# Patient Record
Sex: Female | Born: 1935 | Race: White | Hispanic: No | Marital: Single | Smoking: Former smoker
Health system: Southern US, Community
[De-identification: ages and names within clinical notes are randomized; demographics above are authoritative.]

## PROBLEM LIST (undated history)

## (undated) DIAGNOSIS — I2510 Atherosclerotic heart disease of native coronary artery without angina pectoris: Secondary | ICD-10-CM

## (undated) DIAGNOSIS — B029 Zoster without complications: Secondary | ICD-10-CM

## (undated) DIAGNOSIS — Z902 Acquired absence of lung [part of]: Secondary | ICD-10-CM

## (undated) DIAGNOSIS — R51 Headache: Secondary | ICD-10-CM

## (undated) DIAGNOSIS — I714 Abdominal aortic aneurysm, without rupture, unspecified: Secondary | ICD-10-CM

## (undated) DIAGNOSIS — D649 Anemia, unspecified: Secondary | ICD-10-CM

## (undated) DIAGNOSIS — I77810 Thoracic aortic ectasia: Secondary | ICD-10-CM

## (undated) DIAGNOSIS — E7800 Pure hypercholesterolemia, unspecified: Secondary | ICD-10-CM

## (undated) DIAGNOSIS — Z952 Presence of prosthetic heart valve: Secondary | ICD-10-CM

## (undated) DIAGNOSIS — I679 Cerebrovascular disease, unspecified: Secondary | ICD-10-CM

## (undated) DIAGNOSIS — I7101 Dissection of thoracic aorta: Secondary | ICD-10-CM

## (undated) DIAGNOSIS — I10 Essential (primary) hypertension: Secondary | ICD-10-CM

## (undated) DIAGNOSIS — R911 Solitary pulmonary nodule: Secondary | ICD-10-CM

## (undated) DIAGNOSIS — J449 Chronic obstructive pulmonary disease, unspecified: Secondary | ICD-10-CM

## (undated) DIAGNOSIS — I4891 Unspecified atrial fibrillation: Secondary | ICD-10-CM

## (undated) DIAGNOSIS — C3490 Malignant neoplasm of unspecified part of unspecified bronchus or lung: Secondary | ICD-10-CM

## (undated) HISTORY — DX: Presence of prosthetic heart valve: Z95.2

## (undated) HISTORY — DX: Zoster without complications: B02.9

## (undated) HISTORY — DX: Acquired absence of lung (part of): Z90.2

## (undated) HISTORY — DX: Malignant neoplasm of unspecified part of unspecified bronchus or lung: C34.90

## (undated) HISTORY — DX: Unspecified atrial fibrillation: I48.91

## (undated) HISTORY — PX: CARDIAC CATHETERIZATION: SHX172

## (undated) HISTORY — DX: Abdominal aortic aneurysm, without rupture: I71.4

## (undated) HISTORY — DX: Solitary pulmonary nodule: R91.1

## (undated) HISTORY — PX: DILATION AND CURETTAGE OF UTERUS: SHX78

## (undated) HISTORY — DX: Thoracic aortic ectasia: I77.810

## (undated) HISTORY — DX: Chronic obstructive pulmonary disease, unspecified: J44.9

## (undated) HISTORY — PX: CATARACT EXTRACTION W/ INTRAOCULAR LENS  IMPLANT, BILATERAL: SHX1307

## (undated) HISTORY — DX: Atherosclerotic heart disease of native coronary artery without angina pectoris: I25.10

## (undated) HISTORY — DX: Cerebrovascular disease, unspecified: I67.9

## (undated) HISTORY — DX: Essential (primary) hypertension: I10

## (undated) HISTORY — PX: APPENDECTOMY: SHX54

## (undated) HISTORY — PX: COLONOSCOPY: SHX174

## (undated) HISTORY — PX: TONSILLECTOMY AND ADENOIDECTOMY: SHX28

## (undated) HISTORY — DX: Abdominal aortic aneurysm, without rupture, unspecified: I71.40

## (undated) HISTORY — DX: Anemia, unspecified: D64.9

## (undated) HISTORY — PX: ABDOMINAL SURGERY: SHX537

---
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
## 1975-07-20 ENCOUNTER — Encounter: Payer: Self-pay | Admitting: Cardiology

## 2003-12-01 DIAGNOSIS — J449 Chronic obstructive pulmonary disease, unspecified: Secondary | ICD-10-CM

## 2003-12-01 HISTORY — DX: Chronic obstructive pulmonary disease, unspecified: J44.9

## 2007-12-01 HISTORY — PX: OTHER SURGICAL HISTORY: SHX169

## 2008-06-06 ENCOUNTER — Ambulatory Visit: Payer: Self-pay | Admitting: Family Medicine

## 2008-06-06 DIAGNOSIS — Z8719 Personal history of other diseases of the digestive system: Secondary | ICD-10-CM | POA: Insufficient documentation

## 2008-06-06 DIAGNOSIS — D509 Iron deficiency anemia, unspecified: Secondary | ICD-10-CM

## 2008-06-06 DIAGNOSIS — I10 Essential (primary) hypertension: Secondary | ICD-10-CM | POA: Insufficient documentation

## 2008-06-06 LAB — CONVERTED CEMR LAB
Bilirubin Urine: NEGATIVE
Glucose, Urine, Semiquant: NEGATIVE
Protein, U semiquant: NEGATIVE
Specific Gravity, Urine: 1.01
pH: 7

## 2008-06-08 ENCOUNTER — Encounter: Payer: Self-pay | Admitting: Family Medicine

## 2008-06-19 ENCOUNTER — Encounter: Payer: Self-pay | Admitting: Family Medicine

## 2008-06-19 DIAGNOSIS — K5730 Diverticulosis of large intestine without perforation or abscess without bleeding: Secondary | ICD-10-CM | POA: Insufficient documentation

## 2008-06-21 ENCOUNTER — Ambulatory Visit: Payer: Self-pay | Admitting: Family Medicine

## 2008-06-21 ENCOUNTER — Encounter: Admission: RE | Admit: 2008-06-21 | Discharge: 2008-06-21 | Payer: Self-pay | Admitting: Family Medicine

## 2008-06-25 ENCOUNTER — Encounter: Admission: RE | Admit: 2008-06-25 | Discharge: 2008-07-04 | Payer: Self-pay | Admitting: Family Medicine

## 2008-06-25 ENCOUNTER — Encounter: Payer: Self-pay | Admitting: Family Medicine

## 2008-07-04 ENCOUNTER — Encounter: Payer: Self-pay | Admitting: Family Medicine

## 2008-07-25 ENCOUNTER — Ambulatory Visit: Payer: Self-pay | Admitting: Cardiology

## 2008-08-03 ENCOUNTER — Ambulatory Visit: Payer: Self-pay | Admitting: Cardiology

## 2008-08-03 ENCOUNTER — Inpatient Hospital Stay (HOSPITAL_BASED_OUTPATIENT_CLINIC_OR_DEPARTMENT_OTHER): Admission: RE | Admit: 2008-08-03 | Discharge: 2008-08-03 | Payer: Self-pay | Admitting: Cardiology

## 2008-08-07 ENCOUNTER — Ambulatory Visit: Payer: Self-pay | Admitting: Thoracic Surgery (Cardiothoracic Vascular Surgery)

## 2008-08-13 ENCOUNTER — Ambulatory Visit: Payer: Self-pay | Admitting: Vascular Surgery

## 2008-08-13 ENCOUNTER — Ambulatory Visit (HOSPITAL_COMMUNITY)
Admission: RE | Admit: 2008-08-13 | Discharge: 2008-08-13 | Payer: Self-pay | Admitting: Thoracic Surgery (Cardiothoracic Vascular Surgery)

## 2008-08-13 ENCOUNTER — Encounter: Payer: Self-pay | Admitting: Thoracic Surgery (Cardiothoracic Vascular Surgery)

## 2008-08-14 ENCOUNTER — Encounter
Admission: RE | Admit: 2008-08-14 | Discharge: 2008-08-14 | Payer: Self-pay | Admitting: Thoracic Surgery (Cardiothoracic Vascular Surgery)

## 2008-08-15 ENCOUNTER — Ambulatory Visit (HOSPITAL_BASED_OUTPATIENT_CLINIC_OR_DEPARTMENT_OTHER)
Admission: RE | Admit: 2008-08-15 | Discharge: 2008-08-15 | Payer: Self-pay | Admitting: Thoracic Surgery (Cardiothoracic Vascular Surgery)

## 2008-08-15 ENCOUNTER — Ambulatory Visit: Payer: Self-pay | Admitting: Thoracic Surgery (Cardiothoracic Vascular Surgery)

## 2008-08-17 ENCOUNTER — Ambulatory Visit (HOSPITAL_COMMUNITY)
Admission: RE | Admit: 2008-08-17 | Discharge: 2008-08-17 | Payer: Self-pay | Admitting: Thoracic Surgery (Cardiothoracic Vascular Surgery)

## 2008-08-20 ENCOUNTER — Ambulatory Visit: Payer: Self-pay | Admitting: Thoracic Surgery (Cardiothoracic Vascular Surgery)

## 2008-08-24 ENCOUNTER — Ambulatory Visit (HOSPITAL_COMMUNITY)
Admission: RE | Admit: 2008-08-24 | Discharge: 2008-08-24 | Payer: Self-pay | Admitting: Thoracic Surgery (Cardiothoracic Vascular Surgery)

## 2008-08-28 ENCOUNTER — Ambulatory Visit: Payer: Self-pay | Admitting: Cardiology

## 2008-08-28 ENCOUNTER — Inpatient Hospital Stay (HOSPITAL_COMMUNITY)
Admission: RE | Admit: 2008-08-28 | Discharge: 2008-09-03 | Payer: Self-pay | Admitting: Thoracic Surgery (Cardiothoracic Vascular Surgery)

## 2008-08-28 ENCOUNTER — Encounter: Payer: Self-pay | Admitting: Thoracic Surgery (Cardiothoracic Vascular Surgery)

## 2008-08-28 ENCOUNTER — Ambulatory Visit: Payer: Self-pay | Admitting: Thoracic Surgery (Cardiothoracic Vascular Surgery)

## 2008-08-28 DIAGNOSIS — C3490 Malignant neoplasm of unspecified part of unspecified bronchus or lung: Secondary | ICD-10-CM

## 2008-08-28 DIAGNOSIS — Z902 Acquired absence of lung [part of]: Secondary | ICD-10-CM

## 2008-08-28 DIAGNOSIS — Z952 Presence of prosthetic heart valve: Secondary | ICD-10-CM

## 2008-08-28 HISTORY — DX: Malignant neoplasm of unspecified part of unspecified bronchus or lung: C34.90

## 2008-08-28 HISTORY — PX: OTHER SURGICAL HISTORY: SHX169

## 2008-08-28 HISTORY — DX: Acquired absence of lung (part of): Z90.2

## 2008-08-28 HISTORY — PX: AORTIC VALVE REPLACEMENT: SHX41

## 2008-08-28 HISTORY — DX: Presence of prosthetic heart valve: Z95.2

## 2008-09-10 ENCOUNTER — Ambulatory Visit: Payer: Self-pay | Admitting: Thoracic Surgery (Cardiothoracic Vascular Surgery)

## 2008-09-12 ENCOUNTER — Ambulatory Visit: Payer: Self-pay | Admitting: Cardiology

## 2008-09-14 ENCOUNTER — Encounter: Payer: Self-pay | Admitting: Family Medicine

## 2008-09-20 ENCOUNTER — Ambulatory Visit: Payer: Self-pay | Admitting: Family Medicine

## 2008-09-21 ENCOUNTER — Ambulatory Visit: Payer: Self-pay | Admitting: Family Medicine

## 2008-09-21 ENCOUNTER — Telehealth: Payer: Self-pay | Admitting: Family Medicine

## 2008-09-22 ENCOUNTER — Ambulatory Visit: Payer: Self-pay | Admitting: *Deleted

## 2008-09-22 ENCOUNTER — Encounter: Payer: Self-pay | Admitting: Emergency Medicine

## 2008-09-22 ENCOUNTER — Inpatient Hospital Stay (HOSPITAL_COMMUNITY): Admission: AD | Admit: 2008-09-22 | Discharge: 2008-09-24 | Payer: Self-pay | Admitting: Cardiology

## 2008-09-24 ENCOUNTER — Encounter: Payer: Self-pay | Admitting: Family Medicine

## 2008-09-30 DIAGNOSIS — B029 Zoster without complications: Secondary | ICD-10-CM

## 2008-09-30 HISTORY — DX: Zoster without complications: B02.9

## 2008-10-01 ENCOUNTER — Encounter: Payer: Self-pay | Admitting: Family Medicine

## 2008-10-15 ENCOUNTER — Encounter
Admission: RE | Admit: 2008-10-15 | Discharge: 2008-10-15 | Payer: Self-pay | Admitting: Thoracic Surgery (Cardiothoracic Vascular Surgery)

## 2008-10-15 ENCOUNTER — Ambulatory Visit: Payer: Self-pay | Admitting: Thoracic Surgery (Cardiothoracic Vascular Surgery)

## 2008-10-24 ENCOUNTER — Emergency Department (HOSPITAL_BASED_OUTPATIENT_CLINIC_OR_DEPARTMENT_OTHER): Admission: EM | Admit: 2008-10-24 | Discharge: 2008-10-24 | Payer: Self-pay | Admitting: Emergency Medicine

## 2008-10-24 ENCOUNTER — Ambulatory Visit: Payer: Self-pay | Admitting: Family Medicine

## 2008-10-24 DIAGNOSIS — C342 Malignant neoplasm of middle lobe, bronchus or lung: Secondary | ICD-10-CM

## 2008-10-24 DIAGNOSIS — Z954 Presence of other heart-valve replacement: Secondary | ICD-10-CM

## 2008-10-24 DIAGNOSIS — E785 Hyperlipidemia, unspecified: Secondary | ICD-10-CM

## 2008-10-24 LAB — CONVERTED CEMR LAB: Rapid Strep: NEGATIVE

## 2008-10-31 ENCOUNTER — Ambulatory Visit: Payer: Self-pay | Admitting: Cardiology

## 2008-11-13 ENCOUNTER — Telehealth: Payer: Self-pay | Admitting: Family Medicine

## 2008-11-14 ENCOUNTER — Telehealth: Payer: Self-pay | Admitting: Family Medicine

## 2008-12-24 ENCOUNTER — Ambulatory Visit: Payer: Self-pay | Admitting: Thoracic Surgery (Cardiothoracic Vascular Surgery)

## 2008-12-24 ENCOUNTER — Encounter
Admission: RE | Admit: 2008-12-24 | Discharge: 2008-12-24 | Payer: Self-pay | Admitting: Thoracic Surgery (Cardiothoracic Vascular Surgery)

## 2009-01-08 ENCOUNTER — Telehealth (INDEPENDENT_AMBULATORY_CARE_PROVIDER_SITE_OTHER): Payer: Self-pay | Admitting: *Deleted

## 2009-01-08 ENCOUNTER — Ambulatory Visit: Payer: Self-pay | Admitting: Family Medicine

## 2009-01-08 ENCOUNTER — Encounter: Admission: RE | Admit: 2009-01-08 | Discharge: 2009-01-08 | Payer: Self-pay | Admitting: Family Medicine

## 2009-01-08 DIAGNOSIS — E049 Nontoxic goiter, unspecified: Secondary | ICD-10-CM | POA: Insufficient documentation

## 2009-01-08 DIAGNOSIS — K115 Sialolithiasis: Secondary | ICD-10-CM

## 2009-01-08 LAB — CONVERTED CEMR LAB: T3, Free: 3 pg/mL (ref 2.3–4.2)

## 2009-01-09 ENCOUNTER — Other Ambulatory Visit: Admission: RE | Admit: 2009-01-09 | Discharge: 2009-01-09 | Payer: Self-pay | Admitting: Diagnostic Radiology

## 2009-01-09 ENCOUNTER — Encounter: Payer: Self-pay | Admitting: Family Medicine

## 2009-01-09 ENCOUNTER — Encounter (INDEPENDENT_AMBULATORY_CARE_PROVIDER_SITE_OTHER): Payer: Self-pay | Admitting: Diagnostic Radiology

## 2009-01-09 ENCOUNTER — Telehealth: Payer: Self-pay | Admitting: Family Medicine

## 2009-01-09 ENCOUNTER — Encounter: Admission: RE | Admit: 2009-01-09 | Discharge: 2009-01-09 | Payer: Self-pay | Admitting: Family Medicine

## 2009-01-09 DIAGNOSIS — E041 Nontoxic single thyroid nodule: Secondary | ICD-10-CM

## 2009-01-11 ENCOUNTER — Encounter: Payer: Self-pay | Admitting: Family Medicine

## 2009-01-30 ENCOUNTER — Ambulatory Visit: Payer: Self-pay | Admitting: Cardiology

## 2009-01-30 ENCOUNTER — Encounter: Payer: Self-pay | Admitting: Cardiology

## 2009-02-18 ENCOUNTER — Ambulatory Visit: Payer: Self-pay | Admitting: Thoracic Surgery (Cardiothoracic Vascular Surgery)

## 2009-02-18 ENCOUNTER — Encounter
Admission: RE | Admit: 2009-02-18 | Discharge: 2009-02-18 | Payer: Self-pay | Admitting: Thoracic Surgery (Cardiothoracic Vascular Surgery)

## 2009-03-11 ENCOUNTER — Encounter: Payer: Self-pay | Admitting: Family Medicine

## 2009-03-20 ENCOUNTER — Ambulatory Visit: Payer: Self-pay | Admitting: Family Medicine

## 2009-03-20 DIAGNOSIS — F411 Generalized anxiety disorder: Secondary | ICD-10-CM

## 2009-03-26 ENCOUNTER — Ambulatory Visit: Payer: Self-pay | Admitting: Family Medicine

## 2009-03-26 DIAGNOSIS — M542 Cervicalgia: Secondary | ICD-10-CM

## 2009-03-28 ENCOUNTER — Encounter: Admission: RE | Admit: 2009-03-28 | Discharge: 2009-03-28 | Payer: Self-pay | Admitting: Family Medicine

## 2009-04-02 ENCOUNTER — Emergency Department (HOSPITAL_BASED_OUTPATIENT_CLINIC_OR_DEPARTMENT_OTHER): Admission: EM | Admit: 2009-04-02 | Discharge: 2009-04-02 | Payer: Self-pay | Admitting: Emergency Medicine

## 2009-04-02 ENCOUNTER — Ambulatory Visit: Payer: Self-pay | Admitting: Diagnostic Radiology

## 2009-04-05 IMAGING — CR DG CHEST 2V
2 series · 2 of 2 positions shown · non-contrast
Comparison: 09/03/2008.

CLINICAL DATA: Question reaction to medication.  Ex-smoker.  Chest
pain.

CHEST - 2 VIEW

[w chest pa]
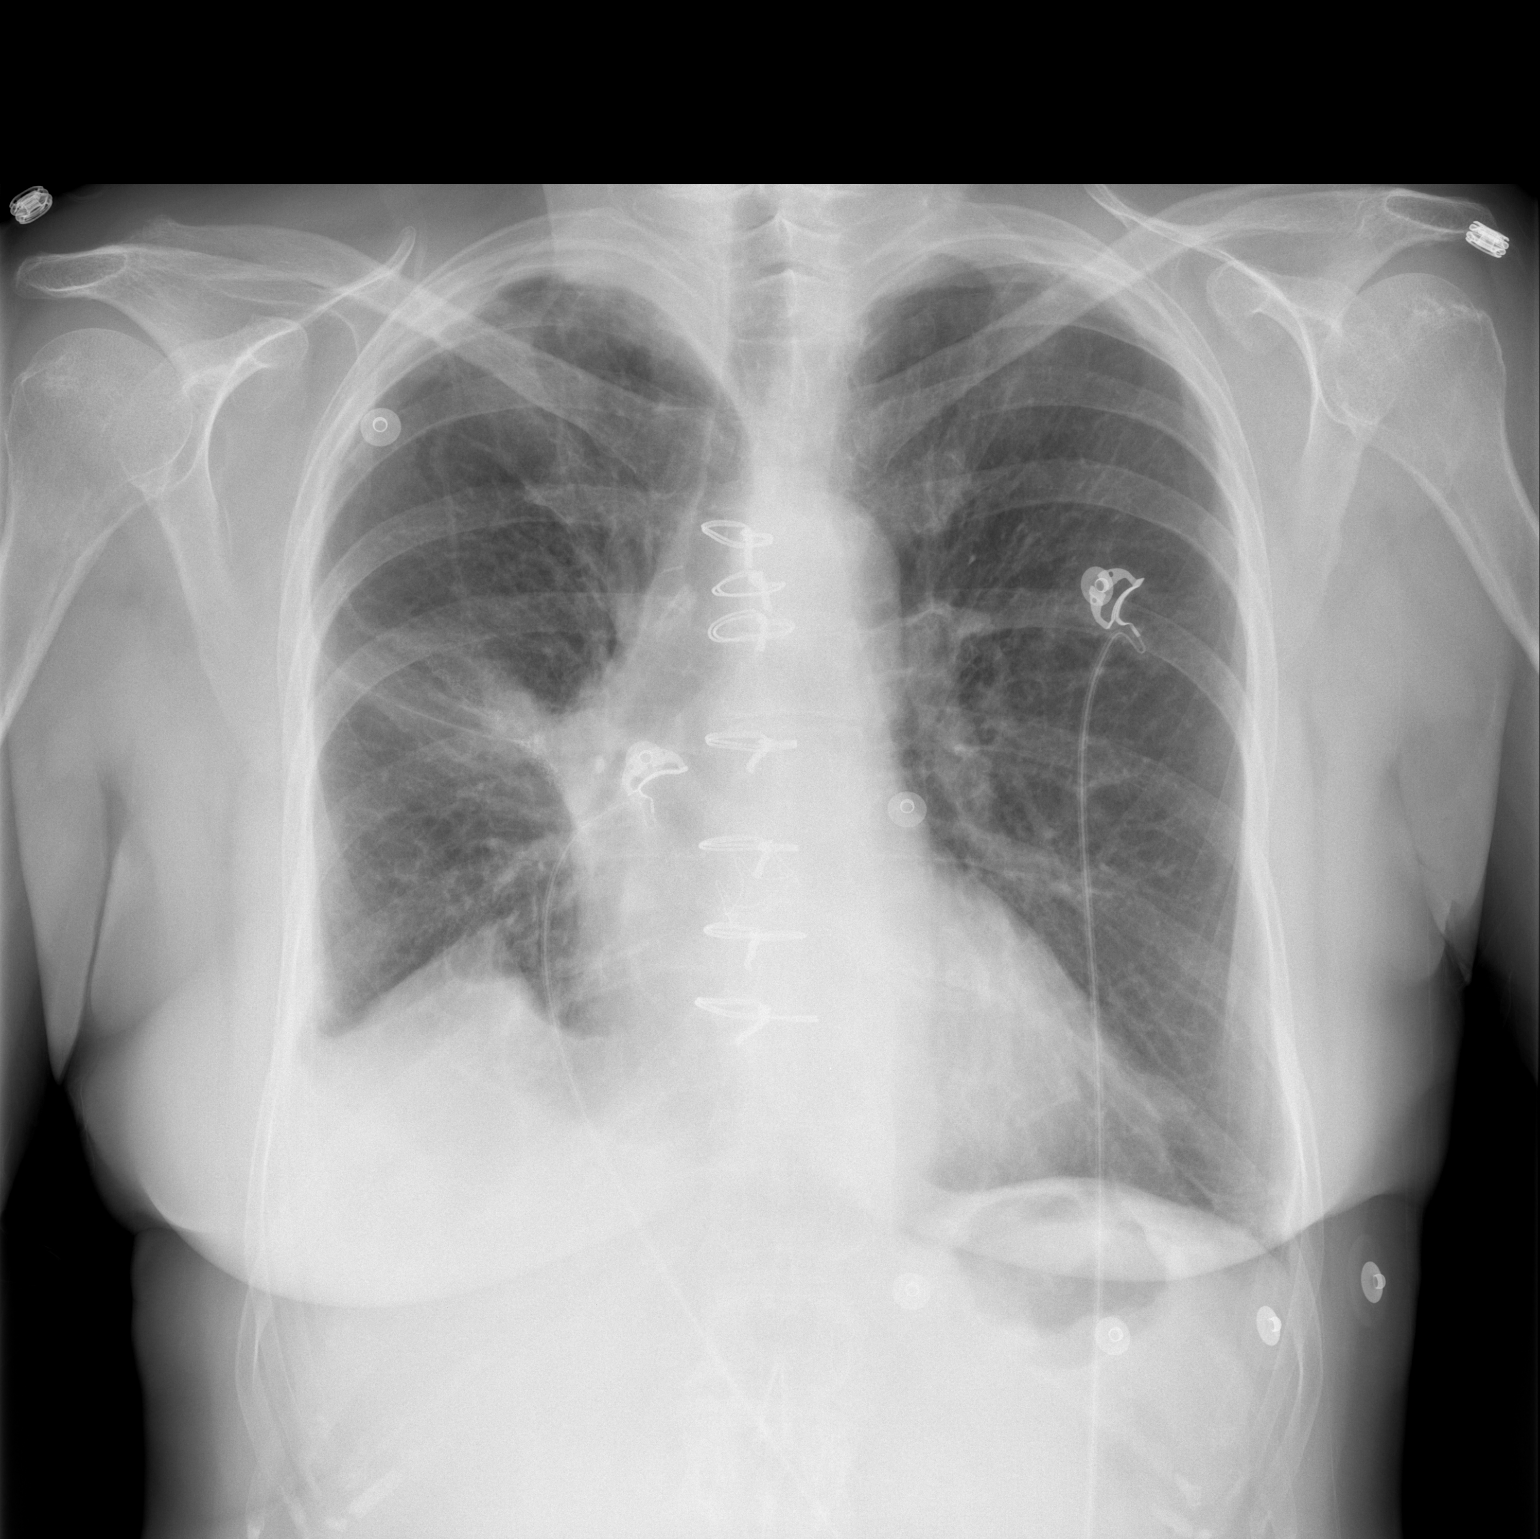

[w chest lat]
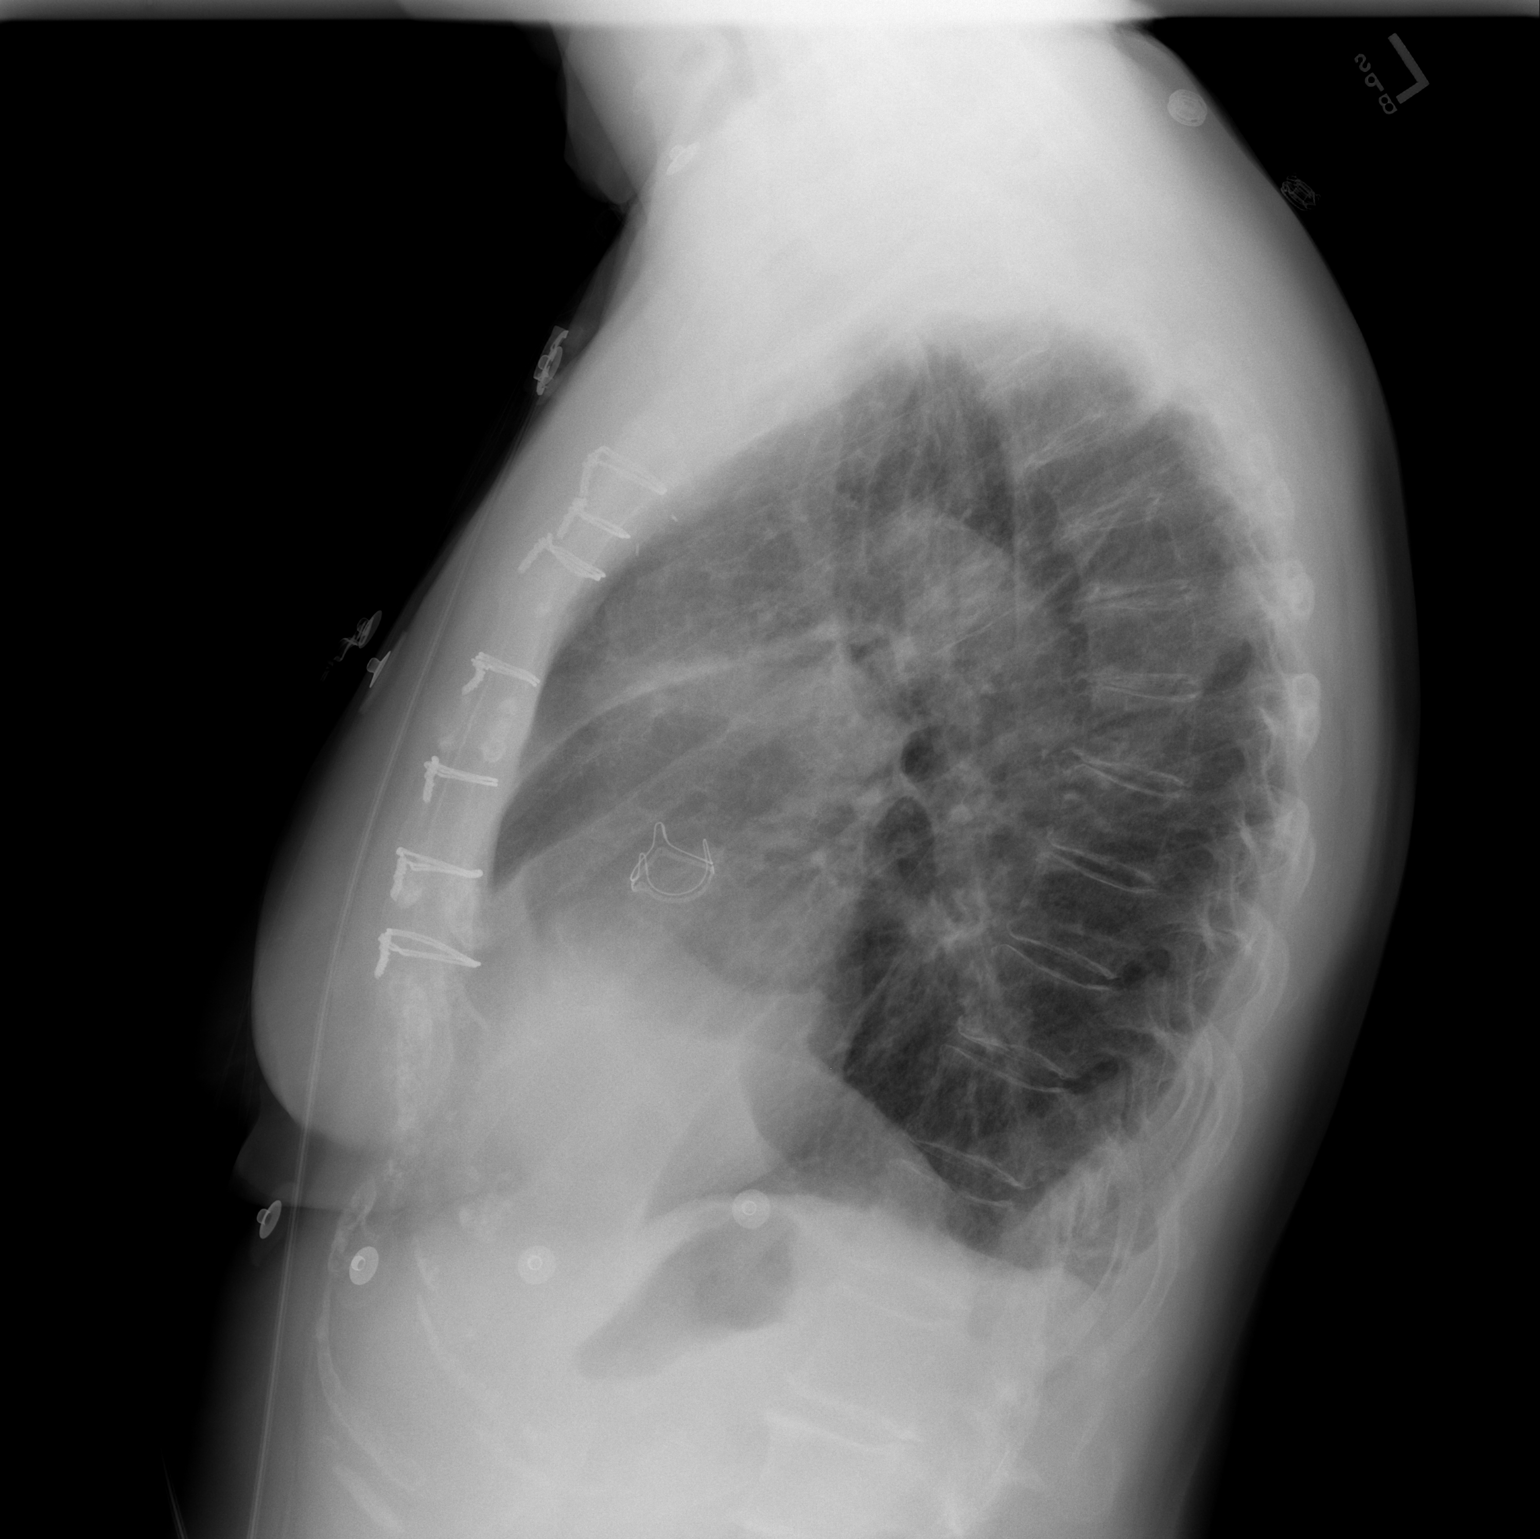

[2 of 2 positions shown; findings below may reference images not displayed]

FINDINGS: Postsurgical changes right hilar region.  What remains
may represent treated tumor although residual or recurrent tumor
cannot be excluded. Elevation right hemidiaphragm subpulmonic right-
sided pleural effusion cannot be excluded.  Interval clearing of
previously noted right apical pneumothorax and retrosternal
hydropneumothorax.  Mild pulmonary vascular prominence.  Heart is
slightly enlarged.  No segmental new area of consolidation.
Interval clearing of left-sided pleural effusion.  Mild
replacement.
IMPRESSION: Interval clearing of the right apical pneumothorax and
hydropneumothorax seen in the retrosternal region.

Post therapy type changes right hilum.  What remains may represent
treated tumor although residual/recurrent tumor cannot be excluded.

No new area of consolidation.

Interval clearing left pleural effusion.

Slight progressive elevation right hemidiaphragm.  Subpulmonic
effusion cannot be excluded.

Pulmonary vascular prominence.

Cardiomegaly.

## 2009-04-15 ENCOUNTER — Encounter
Admission: RE | Admit: 2009-04-15 | Discharge: 2009-04-15 | Payer: Self-pay | Admitting: Thoracic Surgery (Cardiothoracic Vascular Surgery)

## 2009-04-15 ENCOUNTER — Encounter: Payer: Self-pay | Admitting: Family Medicine

## 2009-04-15 ENCOUNTER — Encounter: Payer: Self-pay | Admitting: Cardiology

## 2009-04-15 ENCOUNTER — Ambulatory Visit: Payer: Self-pay | Admitting: Thoracic Surgery (Cardiothoracic Vascular Surgery)

## 2009-05-07 ENCOUNTER — Encounter: Payer: Self-pay | Admitting: Cardiology

## 2009-05-15 ENCOUNTER — Encounter: Payer: Self-pay | Admitting: Cardiology

## 2009-05-15 ENCOUNTER — Ambulatory Visit: Payer: Self-pay | Admitting: Cardiology

## 2009-05-15 DIAGNOSIS — G459 Transient cerebral ischemic attack, unspecified: Secondary | ICD-10-CM | POA: Insufficient documentation

## 2009-05-15 DIAGNOSIS — I723 Aneurysm of iliac artery: Secondary | ICD-10-CM

## 2009-05-15 DIAGNOSIS — I2510 Atherosclerotic heart disease of native coronary artery without angina pectoris: Secondary | ICD-10-CM | POA: Insufficient documentation

## 2009-05-15 DIAGNOSIS — I739 Peripheral vascular disease, unspecified: Secondary | ICD-10-CM

## 2009-05-22 ENCOUNTER — Encounter (INDEPENDENT_AMBULATORY_CARE_PROVIDER_SITE_OTHER): Payer: Self-pay | Admitting: *Deleted

## 2009-05-28 ENCOUNTER — Encounter: Payer: Self-pay | Admitting: Cardiology

## 2009-05-28 ENCOUNTER — Ambulatory Visit: Payer: Self-pay

## 2009-05-28 ENCOUNTER — Ambulatory Visit: Payer: Self-pay | Admitting: Cardiology

## 2009-05-29 LAB — CONVERTED CEMR LAB
BUN: 11 mg/dL (ref 6–23)
CO2: 27 meq/L (ref 19–32)
Calcium: 9.3 mg/dL (ref 8.4–10.5)
Chloride: 101 meq/L (ref 96–112)
Creatinine, Ser: 0.7 mg/dL (ref 0.4–1.2)
Glucose, Bld: 95 mg/dL (ref 70–99)

## 2009-08-13 ENCOUNTER — Ambulatory Visit: Payer: Self-pay | Admitting: Family Medicine

## 2009-08-20 ENCOUNTER — Ambulatory Visit: Payer: Self-pay | Admitting: Family Medicine

## 2009-08-27 ENCOUNTER — Encounter (INDEPENDENT_AMBULATORY_CARE_PROVIDER_SITE_OTHER): Payer: Self-pay | Admitting: *Deleted

## 2009-09-09 ENCOUNTER — Encounter
Admission: RE | Admit: 2009-09-09 | Discharge: 2009-09-09 | Payer: Self-pay | Admitting: Thoracic Surgery (Cardiothoracic Vascular Surgery)

## 2009-09-09 ENCOUNTER — Ambulatory Visit: Payer: Self-pay | Admitting: Thoracic Surgery (Cardiothoracic Vascular Surgery)

## 2009-09-09 ENCOUNTER — Encounter: Payer: Self-pay | Admitting: Cardiology

## 2009-10-10 ENCOUNTER — Ambulatory Visit: Payer: Self-pay | Admitting: Family Medicine

## 2009-12-02 ENCOUNTER — Ambulatory Visit: Payer: Self-pay | Admitting: Occupational Medicine

## 2009-12-04 ENCOUNTER — Encounter: Payer: Self-pay | Admitting: Cardiology

## 2009-12-04 ENCOUNTER — Ambulatory Visit: Payer: Self-pay | Admitting: Cardiology

## 2009-12-05 ENCOUNTER — Telehealth: Payer: Self-pay | Admitting: Cardiology

## 2009-12-06 ENCOUNTER — Encounter: Admission: RE | Admit: 2009-12-06 | Discharge: 2009-12-06 | Payer: Self-pay | Admitting: Cardiology

## 2010-01-10 ENCOUNTER — Telehealth (INDEPENDENT_AMBULATORY_CARE_PROVIDER_SITE_OTHER): Payer: Self-pay | Admitting: *Deleted

## 2010-01-10 ENCOUNTER — Ambulatory Visit: Payer: Self-pay | Admitting: Family Medicine

## 2010-01-29 ENCOUNTER — Encounter: Payer: Self-pay | Admitting: Cardiology

## 2010-01-29 ENCOUNTER — Ambulatory Visit: Payer: Self-pay | Admitting: Cardiology

## 2010-02-17 ENCOUNTER — Encounter: Payer: Self-pay | Admitting: Family Medicine

## 2010-02-17 ENCOUNTER — Ambulatory Visit: Payer: Self-pay | Admitting: Thoracic Surgery (Cardiothoracic Vascular Surgery)

## 2010-02-17 ENCOUNTER — Encounter
Admission: RE | Admit: 2010-02-17 | Discharge: 2010-02-17 | Payer: Self-pay | Admitting: Thoracic Surgery (Cardiothoracic Vascular Surgery)

## 2010-04-25 ENCOUNTER — Encounter: Payer: Self-pay | Admitting: Cardiology

## 2010-04-26 ENCOUNTER — Encounter: Payer: Self-pay | Admitting: Cardiology

## 2010-04-29 ENCOUNTER — Telehealth: Payer: Self-pay | Admitting: Cardiology

## 2010-05-08 ENCOUNTER — Telehealth: Payer: Self-pay | Admitting: Cardiology

## 2010-05-13 ENCOUNTER — Encounter: Payer: Self-pay | Admitting: Cardiology

## 2010-05-13 ENCOUNTER — Ambulatory Visit: Payer: Self-pay | Admitting: Cardiology

## 2010-05-21 ENCOUNTER — Ambulatory Visit: Payer: Self-pay | Admitting: Family

## 2010-07-11 ENCOUNTER — Ambulatory Visit (HOSPITAL_BASED_OUTPATIENT_CLINIC_OR_DEPARTMENT_OTHER): Admission: RE | Admit: 2010-07-11 | Discharge: 2010-07-11 | Payer: Self-pay | Admitting: Internal Medicine

## 2010-07-11 ENCOUNTER — Ambulatory Visit: Payer: Self-pay | Admitting: Family

## 2010-07-11 ENCOUNTER — Ambulatory Visit: Payer: Self-pay | Admitting: Interventional Radiology

## 2010-08-18 ENCOUNTER — Encounter
Admission: RE | Admit: 2010-08-18 | Discharge: 2010-08-18 | Payer: Self-pay | Admitting: Thoracic Surgery (Cardiothoracic Vascular Surgery)

## 2010-08-18 ENCOUNTER — Ambulatory Visit: Payer: Self-pay | Admitting: Thoracic Surgery (Cardiothoracic Vascular Surgery)

## 2010-08-18 ENCOUNTER — Encounter: Payer: Self-pay | Admitting: Family Medicine

## 2010-09-01 ENCOUNTER — Ambulatory Visit: Payer: Self-pay | Admitting: Family

## 2010-09-01 LAB — CONVERTED CEMR LAB
ALT: 19 units/L (ref 0–35)
Amylase: 65 units/L (ref 0–105)
Basophils Absolute: 0 10*3/uL (ref 0.0–0.1)
Basophils Relative: 0 % (ref 0–1)
Bilirubin, Direct: 0.1 mg/dL (ref 0.0–0.3)
Eosinophils Absolute: 0.3 10*3/uL (ref 0.0–0.7)
Indirect Bilirubin: 0.4 mg/dL (ref 0.0–0.9)
Lipase: 41 units/L (ref 0–75)
MCHC: 33.5 g/dL (ref 30.0–36.0)
MCV: 88.6 fL (ref 78.0–100.0)
Neutro Abs: 7.3 10*3/uL (ref 1.7–7.7)
Neutrophils Relative %: 70 % (ref 43–77)
Platelets: 336 10*3/uL (ref 150–400)
RDW: 12.8 % (ref 11.5–15.5)
Total Protein: 6.4 g/dL (ref 6.0–8.3)

## 2010-09-02 ENCOUNTER — Telehealth: Payer: Self-pay | Admitting: Family

## 2010-09-02 ENCOUNTER — Encounter: Admission: RE | Admit: 2010-09-02 | Discharge: 2010-09-02 | Payer: Self-pay | Admitting: Internal Medicine

## 2010-09-15 ENCOUNTER — Ambulatory Visit: Payer: Self-pay | Admitting: Family

## 2010-09-15 ENCOUNTER — Other Ambulatory Visit: Admission: RE | Admit: 2010-09-15 | Discharge: 2010-09-15 | Payer: Self-pay | Admitting: Family

## 2010-09-15 LAB — CONVERTED CEMR LAB: Pap Smear: NEGATIVE

## 2010-09-18 ENCOUNTER — Encounter: Payer: Self-pay | Admitting: Family

## 2010-09-18 ENCOUNTER — Ambulatory Visit (HOSPITAL_BASED_OUTPATIENT_CLINIC_OR_DEPARTMENT_OTHER): Admission: RE | Admit: 2010-09-18 | Discharge: 2010-09-18 | Payer: Self-pay | Admitting: Internal Medicine

## 2010-09-18 ENCOUNTER — Ambulatory Visit: Payer: Self-pay | Admitting: Diagnostic Radiology

## 2010-09-19 ENCOUNTER — Encounter: Payer: Self-pay | Admitting: Family

## 2010-09-22 ENCOUNTER — Telehealth: Payer: Self-pay | Admitting: Family

## 2010-09-22 LAB — CONVERTED CEMR LAB
CO2: 26 meq/L (ref 19–32)
Calcium: 9.9 mg/dL (ref 8.4–10.5)
Chloride: 100 meq/L (ref 96–112)
Creatinine, Ser: 0.78 mg/dL (ref 0.40–1.20)
HDL: 38 mg/dL — ABNORMAL LOW (ref >39)
Sodium: 137 meq/L (ref 135–145)
Total CHOL/HDL Ratio: 7.7
Triglycerides: 308 mg/dL — ABNORMAL HIGH (ref <150)

## 2010-09-23 ENCOUNTER — Encounter: Admission: RE | Admit: 2010-09-23 | Discharge: 2010-09-23 | Payer: Self-pay | Admitting: Internal Medicine

## 2010-09-23 ENCOUNTER — Encounter: Payer: Self-pay | Admitting: Family

## 2010-09-29 ENCOUNTER — Telehealth: Payer: Self-pay | Admitting: Family

## 2010-09-29 ENCOUNTER — Encounter: Payer: Self-pay | Admitting: Family

## 2010-09-29 DIAGNOSIS — M949 Disorder of cartilage, unspecified: Secondary | ICD-10-CM

## 2010-09-29 DIAGNOSIS — M899 Disorder of bone, unspecified: Secondary | ICD-10-CM | POA: Insufficient documentation

## 2010-09-30 ENCOUNTER — Encounter: Payer: Self-pay | Admitting: Family

## 2010-10-07 ENCOUNTER — Telehealth: Payer: Self-pay | Admitting: Family

## 2010-11-10 ENCOUNTER — Ambulatory Visit: Payer: Self-pay | Admitting: Family

## 2010-11-10 DIAGNOSIS — R7309 Other abnormal glucose: Secondary | ICD-10-CM | POA: Insufficient documentation

## 2010-11-10 DIAGNOSIS — L259 Unspecified contact dermatitis, unspecified cause: Secondary | ICD-10-CM | POA: Insufficient documentation

## 2010-11-11 ENCOUNTER — Telehealth: Payer: Self-pay | Admitting: Family

## 2010-11-12 ENCOUNTER — Telehealth: Payer: Self-pay | Admitting: Family

## 2010-11-19 ENCOUNTER — Encounter: Payer: Self-pay | Admitting: Cardiology

## 2010-11-19 ENCOUNTER — Ambulatory Visit: Payer: Self-pay | Admitting: Cardiology

## 2010-12-02 ENCOUNTER — Telehealth: Payer: Self-pay | Admitting: Family

## 2010-12-11 ENCOUNTER — Encounter: Payer: Self-pay | Admitting: Cardiology

## 2010-12-21 ENCOUNTER — Encounter: Payer: Self-pay | Admitting: Thoracic Surgery (Cardiothoracic Vascular Surgery)

## 2010-12-22 ENCOUNTER — Encounter: Payer: Self-pay | Admitting: Thoracic Surgery (Cardiothoracic Vascular Surgery)

## 2010-12-30 NOTE — Progress Notes (Signed)
Summary: wants ov today  Phone Note Call from Patient Call back at Home Phone [phone]   Caller: Patient----LIVE CALL Reason for Call: Talk to Doctor Summary of Call: Wants ov today. Has a pulled muscle near her shoulder. Initial call taken by: Warnell Forester,  January 10, 2010 10:28 AM  Follow-up for Phone Call        Offered Pt apt at 11:30 today. Pt unable to make it. I advised Pt call back after lunch to see if we have any cancellations or she can go to [facility]. Pt agreed.  Follow-up by: Payton Spark CMA,  January 10, 2010 11:08 AM

## 2010-12-30 NOTE — Letter (Signed)
Summary: [facility]  [facility]   Imported By: Lanelle Bal 02/25/2010 09:04:49  _____________________________________________________________________  External Attachment:    Type:   Image     Comment:   External Document

## 2010-12-30 NOTE — Assessment & Plan Note (Signed)
Summary:  Cardiology   Visit Type:  2 months follow up Primary Provider:  Seymour Bars DO  CC:  Pt states .feeling like needles on her chest.  History of Present Illness: Laurie [name] is a pleasant female who has a history of aortic stenosis status post aortic valve replacement in September 2009.  She had a pericardial tissue valve.  She also had a septal myectomy and a right middle lobectomy due to lung CA.  Note, she has a 50% mid LAD by catheterization preoperatively and a 40-60% internal carotid artery stenosis on the left.  She also has a 16-mm aneurysmal dilatation of the right common iliac. Note she had followup CTA of her iliacs in January of 2011. There was moderate stenosis of the right iliac with mild poststenotic dilatation.  Carotid Dopplers were performed on May 28, 2009 and showed a 40-59% right and 0-39% left stenosis. Followup was recommended in one year. A followup echocardiogram in June 2010 revealed  vigorous LV function with an intracavitary gradient 4.3 m/s. There was severe asymmetric septal hypertrophy. There was a prosthetic aortic valve. I last saw her in January of 2011. Since then she occasionally has some dyspnea on exertion but this is not a significant problem by her report. It is relieved with rest. There is no associated chest pain. There is no orthopna or syncope. She does have residual soreness in her chest from her prior surgery.  Current Medications (verified): 1)  Metoprolol Tartrate 50 Mg Tabs (Metoprolol Tartrate) .... Take 1 Tablet By Mouth Two Times A Day 2)  Lisinopril 40 Mg Tabs (Lisinopril) .... Take One Tablet By Mouth Daily 3)  Soma 350 Mg Tabs (Carisoprodol) .Marland Kitchen.. 1 By Mouth Hs As Needed 4)  Aspirin 81 Mg Tbec (Aspirin) .... Take One Tablet By Mouth Daily  Allergies: 1)  ! Vioxx  Past History:  Past Medical History: iron def anemia (Dr Lesle Reek) slow small bowel bleed  Bovine Valve 07-2008 for aortic stenosis Lung Cancer- RML  9-09 shingles 11-09 COPD on CXR 2005 with R apical scaring Atrial Fibrillation-post op Left ICA and right common iliac aneursym Hypertension Cerebrovascular disease  Past Surgical History: Reviewed history from 10/24/2008 and no changes required. appy T&A u/s guided aspiration of R breast cyst at the breast clinic 6-05 RML removed Dr Barry Dienes for lung cancer 2009 heart valve surgery 08/28/08 -- starting cardiac rehab at [facility].  Social History: Reviewed history from 09/21/2008 and no changes required. 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  Grown son lives w/ her.  he has brittle diabetes. other son in [place] with 3 girls. Not in a relationship. Smokes 1/2 ppd x 40 yrs. Now stopped.  No regular exercise. Good diet.  Denies ETOH.  Review of Systems       Some residual soreness in her chest but no fevers or chills, productive cough, hemoptysis, dysphasia, odynophagia, melena, hematochezia, dysuria, hematuria, rash, seizure activity, orthopnea, PND, pedal edema, claudication. Remaining systems are negative.   Vital Signs:  Patient profile:   76 year old female Height:      67 inches Weight:      180 pounds BMI:     28.29 Pulse rate:   72 / minute Pulse rhythm:   regular Resp:     18 per minute BP sitting:   127 / 74  (left arm) Cuff size:   large  Vitals Entered By: Vikki Ports (January 29, 2010 2:41 PM)  Physical Exam  General:  Well-developed well-nourished in no acute distress.  Skin is warm and dry.  HEENT is normal.  Neck is supple. No thyromegaly.  Chest is clear to auscultation with normal expansion.  Cardiovascular exam is regular rate and rhythm. 2/6 systolic murmur left sternal border. No diastolic murmur. Abdominal exam nontender or distended. No masses palpated. Extremities show no edema. neuro grossly intact    EKG  Procedure date:  01/29/2010  Findings:      Sinus rhythm at a rate of 74. Left ventricular hypertrophy with  repolarization abnormality.  Impression & Recommendations:  Problem # 1:  CAROTID ARTERY DISEASE (ICD-433.10) Continue aspirin. Intolerant to statins. Followup carotid Dopplers June 2011. Her updated medication list for this problem includes:    Aspirin 81 Mg Tbec (Aspirin) .Marland Kitchen... Take one tablet by mouth daily  Problem # 2:  CAD (ICD-414.00) Continue aspirin, ACE inhibitor and beta blocker. Intolerant to statins. Her updated medication list for this problem includes:    Metoprolol Tartrate 50 Mg Tabs (Metoprolol tartrate) .Marland Kitchen... Take 1 tablet by mouth two times a day    Lisinopril 40 Mg Tabs (Lisinopril) .Marland Kitchen... Take one tablet by mouth daily    Aspirin 81 Mg Tbec (Aspirin) .Marland Kitchen... Take one tablet by mouth daily  Problem # 3:  ILIAC ARTERY ANEURYSM (ICD-442.2) Followup CT scan showed only mild poststenotic dilatation.  Problem # 4:  DYSLIPIDEMIA (ICD-272.4) Intolerant to statins. Continue diet.  Problem # 5:  AORTIC VALVE REPLACEMENT, HX OF (ICD-V43.3) Continued SBE prophylaxis. Repeat echocardiogram in 6 months when she returns.  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 Followup Dr. Cornelius Moras.  Problem # 7:  ESSENTIAL HYPERTENSION, BENIGN (ICD-401.1) Blood pressure controlled on present medications. Will continue. Her updated medication list for this problem includes:    Metoprolol Tartrate 50 Mg Tabs (Metoprolol tartrate) .Marland Kitchen... Take 1 tablet by mouth two times a day    Lisinopril 40 Mg Tabs (Lisinopril) .Marland Kitchen... Take one tablet by mouth daily    Aspirin 81 Mg Tbec (Aspirin) .Marland Kitchen... Take one tablet by mouth daily  Patient Instructions: 1)  Your physician recommends that you schedule a follow-up appointment in: 6 MONTHS

## 2010-12-30 NOTE — Assessment & Plan Note (Signed)
Summary: BACK PAIN/TJ   Vital Signs:  Patient Profile:   76 Years Old Female CC:      pulled muscle in chest and upper back Height:     67 inches Weight:      183 pounds O2 Sat:      96 % O2 treatment:    Room Air Temp:     97.1 degrees F oral Pulse rate:   69 / minute Pulse rhythm:   regular Resp:     14 per minute BP sitting:   159 / 88  (right arm) Cuff size:   large  Pt. in pain?   yes    Location:   left chest to back    Type:       sharp and sore  Vitals Entered By: Lajean Saver, RN                   Updated Prior Medication List: METOPROLOL TARTRATE 50 MG TABS (METOPROLOL TARTRATE) Take 1 tablet by mouth two times a day LISINOPRIL 40 MG TABS (LISINOPRIL) Take one tablet by mouth daily  Current Allergies: ! VIOXXHistory of Present Illness Chief Complaint: pulled muscle in chest and upper back History of Present Illness: Subjective:  Patient complains of sudden onset pain in left lateral chest about 6 days ago when she bent over.  The pain has persisted and is worse with movement, inspiration, and cough.  No recent URI.  No fevers, chills, and sweats.  No GI or GU symptoms   REVIEW OF SYSTEMS Constitutional Symptoms      Denies fever, chills, night sweats, weight loss, weight gain, and fatigue.  Eyes       Denies change in vision, eye pain, eye discharge, glasses, contact lenses, and eye surgery. Ear/Nose/Throat/Mouth       Denies hearing loss/aids, change in hearing, ear pain, ear discharge, dizziness, frequent runny nose, frequent nose bleeds, sinus problems, sore throat, hoarseness, and tooth pain or bleeding.  Respiratory       Denies dry cough, productive cough, wheezing, shortness of breath, asthma, bronchitis, and emphysema/COPD.  Cardiovascular       Denies murmurs, chest pain, and tires easily with exhertion.    Gastrointestinal       Denies stomach pain, nausea/vomiting, diarrhea, constipation, blood in bowel movements, and  indigestion. Genitourniary       Denies painful urination, kidney stones, and loss of urinary control. Neurological       Denies paralysis, seizures, and fainting/blackouts. Musculoskeletal       Denies muscle pain, joint pain, joint stiffness, decreased range of motion, redness, swelling, muscle weakness, and gout.      Comments: left chest radiating to upper back Skin       Denies bruising, unusual mles/lumps or sores, and hair/skin or nail changes.  Psych       Denies mood changes, temper/anger issues, anxiety/stress, speech problems, depression, and sleep problems. Other Comments: intermittent pain since this past weekend   Past History:  Past Medical History: Reviewed history from 01/30/2009 and no changes required. iron def anemia (Dr Lesle Reek) GI -- [facility] slow small bowel bleed  EGD/ colon 2007 menopause since age 5. [id number] Bovine Valve 07-2008 Lung Cancer- RML 9-09 shingles 11-09 COPD on CXR 2005 with R apical scaring Cards- [facility] Barry Dienes Atrial Fibrillation-post op Left ICA and right common iliac aneursym Hypertension  Past Surgical History: Reviewed history from 10/24/2008 and no changes required. appy T&A u/s guided aspiration of R  breast cyst at the breast clinic 6-05 RML removed Dr Barry Dienes for lung cancer 2009 heart valve surgery 08/28/08 -- starting cardiac rehab at [facility].  Family History: Reviewed history from 06/06/2008 and no changes required. mother died at 31, old age father died at 51, heart dz 3 sisters -- unknown. 1 brother died, accident  Social History: Reviewed history from 09/21/2008 and no changes required. 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  Grown son lives w/ her.  he has brittle diabetes. other son in [place] with 3 girls. Not in a relationship. Smokes 1/2 ppd x 40 yrs. Now stopped.  No regular exercise. Good diet.  Denies ETOH.   Objective:  No acute distress  Eyes:  Pupils are equal, round, and  reactive to light and accomdation.  Extraocular movement is intact.  Conjunctivae are not inflamed.  Mouth:  moist mucous membranes  Neck:  Supple.  No adenopathy is present.   Lungs:  Clear to auscultation.  Breath sounds are equal.  Chest:  Tenderness left anterior/inferior/lateral ribs Abdomen:  Nontender without masses or hepatosplenomegaly.  Bowel sounds are present.  No CVA or flank tenderness.  Chest X-ray with rib detail:  no acute changes Assessment New Problems: RIB PAIN, LEFT SIDED (ICD-786.50)  SUSPECT THORACIC MUSCLE STRAIN  Plan New Medications/Changes: SOMA 350 MG TABS (CARISOPRODOL) 1 by mouth hs as needed  #15 x 0, 01/10/2010, Donna Christen MD  New Orders: T-Chest x-ray, 3 views [[id number]] New Patient Level III [phone] Planning Comments:   Patient declines rib belt.  Apply ice pack several times daily ([facility] information and instruction patient handout given)  Rx for Soma at bedtime.  Follow-up with PCP if not improving   The patient and/or caregiver has been counseled thoroughly with regard to medications prescribed including dosage, schedule, interactions, rationale for use, and possible side effects and they verbalize understanding.  Diagnoses and expected course of recovery discussed and will return if not improved as expected or if the condition worsens. Patient and/or caregiver verbalized understanding.  Prescriptions: SOMA 350 MG TABS (CARISOPRODOL) 1 by mouth hs as needed  #15 x 0   Entered and Authorized by:   Donna Christen MD   Signed by:   Donna Christen MD on 01/10/2010   Method used:   Print then Give to Patient   RxID:   [id number]

## 2010-12-30 NOTE — Letter (Signed)
Summary: [facility]  [facility]   Imported By: Lanelle Bal 09/01/2010 10:03:41  _____________________________________________________________________  External Attachment:    Type:   Image     Comment:   External Document

## 2010-12-30 NOTE — Letter (Signed)
Summary: [facility]  [facility]   Imported By: Marylou Mccoy 06/18/2010 11:11:40  _____________________________________________________________________  External Attachment:    Type:   Image     Comment:   External Document

## 2010-12-30 NOTE — Letter (Signed)
Summary: [facility] - Carotid Duplex  [facility] - Carotid Duplex   Imported By: Marylou Mccoy 06/18/2010 11:23:54  _____________________________________________________________________  External Attachment:    Type:   Image     Comment:   External Document

## 2010-12-30 NOTE — Progress Notes (Signed)
Summary: bone density result  Phone Note Outgoing Call   Summary of Call: Please call patient and let her know that her vitamin D level is normal.  Due to findings of osteoprosis on her bone scan, I would like to add generic fosamax to her regimen as well as caltrate two times a day.  We will plan to repeat her bone scan in 1-2 years. Initial call taken by: Lemont Fillers FNP,  October 07, 2010 11:50 AM  Follow-up for Phone Call        Advised pt per [facility] instructions. Pt concerned about constipation with the calcium. Melissa recommended Colace 100mg  1-2 daily. Pt states that prune juice works well for her and she will try that first. Pt also concerned about possible cost of generic fosamax. Advised pt to call us back if it is too expensive and we will check in to [facility] per Melissa. Nicki Guadalajara Fergerson CMA (AAMA)  October 07, 2010 12:00 PM     New/Updated Medications: ALENDRONATE SODIUM 70 MG TABS (ALENDRONATE SODIUM) one tablet by mouth once weekly in AM with water 30 minutes before meds, food, drink.  Sit upright for 30 minutes after taking medication CALTRATE 600+D PLUS 600-400 MG-UNIT TABS (CALCIUM CARBONATE-VIT D-MIN) one tablet by mouth bid Prescriptions: ALENDRONATE SODIUM 70 MG TABS (ALENDRONATE SODIUM) one tablet by mouth once weekly in AM with water 30 minutes before meds, food, drink.  Sit upright for 30 minutes after taking medication  #4 x 5   Entered and Authorized by:   Lemont Fillers FNP   Signed by:   Lemont Fillers FNP on 10/07/2010   Method used:   Electronically to        [phone]* (retail)       [address]       [place], [place]  [address]       Ph: [phone] or [phone]       Fax: [phone]   RxID:   [id number]

## 2010-12-30 NOTE — Progress Notes (Signed)
----   Converted from flag ---- ---- 09/22/2010 11:22 AM, Mervin Kung CMA (AAMA) wrote: Per Misty Stanley, mammogram has not been read yet. They have requested films from [facility] (09-15-10) where it was previously done. If we don't get anything in the next 1-2 days I will the [place] office.  ---- 09/22/2010 11:15 AM, Lemont Fillers FNP wrote: could you please request mammogram results from downstairs? ------------------------------

## 2010-12-30 NOTE — Assessment & Plan Note (Signed)
Summary: wheezing and short of breath chills/mhf   Vital Signs:  Patient profile:   76 year old female Weight:      183.75 pounds BMI:     28.88 O2 Sat:      95 % on Room air Temp:     98.0 degrees F oral Pulse rate:   63 / minute Pulse rhythm:   regular Resp:     20 per minute BP sitting:   124 / 90  (right arm) Cuff size:   large  Vitals Entered By: Glendell Docker CMA (July 11, 2010 10:46 AM)  O2 Flow:  Room air CC: ?URI Is Patient Diabetic? No Pain Assessment Patient in pain? no       Does patient need assistance? Functional Status Self care Ambulation Normal Comments dose not feel well, shortness of breath, chest soreness, productive cough yellow in color, ongoing fo rthe past 3 days, took temp last night due to chills, but no  temp- felt bad   Primary Care Provider:  Seymour Bars DO  CC:  ?URI.  History of Present Illness: Laurie [name] is a 76 year old female who presents today with complaint  of LE swelling which she noticed two days ago.  Notes that swelling resolved with elevation.  +Cough in AM productive of yellow tinged sputum.  Notes that her breathing feels tight.  Notes some soreness at the base of her right lung.  This has been present since ehr lobectomy.  Notes that she had SOB x 48 hours, worse with exertion.   Notes that she fatigues easily.   Flonnie Hailstone nots that she has been having problems with her hip pain and back pain.  Denies fever, but + chills/sweats.  Preventive Screening-Counseling & Management  Alcohol-Tobacco     Smoking Status: quit  Allergies: 1)  ! Vioxx  Past History:  Past Medical History: Last updated: 01/29/2010 iron def anemia (Dr Lesle Reek) slow small bowel bleed  Bovine Valve 07-2008 for aortic stenosis Lung Cancer- RML 9-09 shingles 11-09 COPD on CXR 2005 with R apical scaring Atrial Fibrillation-post op Left ICA and right common iliac aneursym Hypertension Cerebrovascular disease  Past Surgical History: Last  updated: 10/24/2008 appy T&A u/s guided aspiration of R breast cyst at the breast clinic 6-05 RML removed Dr Barry Dienes for lung cancer 2009 heart valve surgery 08/28/08 -- starting cardiac rehab at [facility].  Family History: Last updated: 06-Jun-2010 mother died at 60, old age father died at 64, stroke 3 sisters -- unknown. 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  Grown son lives w/ her.  he has brittle diabetes. other son in [place] with 3 girls. Not in a relationship. Smokes 1/2 ppd x 40 yrs. Now stopped.  No regular exercise. Good diet.  Denies ETOH.  Risk Factors: Smoking Status: quit (07/11/2010)  Social History: Smoking Status:  quit  Review of Systems       see HPI  Physical Exam  General:  Tired appearing elderly white female, awake, alert and in NAD Head:  Normocephalic and atraumatic without obvious abnormalities. No apparent alopecia or balding. Lungs:  R sided expiratory wheeze, no crackles noted, no increased work of breathing.   Heart:  Grade 2/6 systolic murmur. s1/s2, rrr Extremities:  No lower extremity edema noted today   Impression & Recommendations:  Problem # 1:  ACUTE BRONCHITIS (ICD-466.0) Assessment New CXR notes surgical changes without clear infiltrate.  +  wheezing today, suspect some reactive airway process as well.  Will treat with prednisone taper, empiric zithromax and pro-air.  Pt to f/u in 1 week, sooner if symptoms worsen or do not improve. Orders: CXR- 2view (CXR)  Her updated medication list for this problem includes:    Zithromax Z-pak 250 Mg Tabs (Azithromycin) .Marland Kitchen... 2 tabs by mouth x 1 today, then one tablet by mouth daily x 4 more days    Proair Hfa 108 (90 Base) Mcg/act Aers (Albuterol sulfate) .Marland Kitchen... 2 puffs every 6 hours for 1 week, then as needed  Complete Medication List: 1)  Metoprolol Tartrate 50 Mg Tabs (Metoprolol tartrate) .... Take one  tablet by mouth twice a day 2)  Lisinopril 40 Mg Tabs (Lisinopril) .... Take one tablet by mouth daily 3)  Aspirin 81 Mg Tbec (Aspirin) .... Take one tablet by mouth daily 4)  Zithromax Z-pak 250 Mg Tabs (Azithromycin) .... 2 tabs by mouth x 1 today, then one tablet by mouth daily x 4 more days 5)  Prednisone 10 Mg Tabs (Prednisone) .... Take 4 tablets daily x 2 days, then 3 tabs daily x 2 days, then 2 tabs daily x 2 days, then 1 tab daily for 2 days then stop. 6)  Proair Hfa 108 (90 Base) Mcg/act Aers (Albuterol sulfate) .... 2 puffs every 6 hours for 1 week, then as needed 7)  Tramadol Hcl 50 Mg Tabs (Tramadol hcl) .... One tablet by mouth every 8 hours as needed for severe pain  Patient Instructions: 1)  Call if fever over 101, worsening wheezing, cough, weakness. 2)  Follow up in 1 week.  Prescriptions: TRAMADOL HCL 50 MG TABS (TRAMADOL HCL) one tablet by mouth every 8 hours as needed for severe pain  #30 x 0   Entered and Authorized by:   Lemont Fillers FNP   Signed by:   Lemont Fillers FNP on 07/11/2010   Method used:   Faxed to ...       [facility]  [address] [phone]* (retail)       [address] [place], [place]  [address]       Ph: [phone] or [phone]       Fax: [phone]   RxID:   [id number] TRAMADOL HCL 50 MG TABS (TRAMADOL HCL) one tablet by mouth every 8 hours as needed for severe pain  #30 x 0   Entered and Authorized by:   Lemont Fillers FNP   Signed by:   Lemont Fillers FNP on 07/11/2010   Method used:   Samples Given   RxID:   [id number] PROAIR HFA 108 (90 BASE) MCG/ACT AERS (ALBUTEROL SULFATE) 2 puffs every 6 hours for 1 week, then as needed  #1 x 0   Entered and Authorized by:   Lemont Fillers FNP   Signed by:   Lemont Fillers FNP on 07/11/2010   Method used:   Samples Given   RxID:   [id number] PROVENTIL HFA 108 (90 BASE) MCG/ACT AERS (ALBUTEROL SULFATE) 2 puffs every 6 hours for the next 1 week, then  as needed  #1 x 0   Entered and Authorized by:   Lemont Fillers FNP   Signed by:   Lemont Fillers FNP on 07/11/2010   Method used:   Electronically to        [facility] [phone][facility] (retail)       [address]  [place], [place]  [address]       Ph: [phone] or [phone]       Fax: [phone]   RxID:   [id number] PREDNISONE 10 MG TABS (PREDNISONE) take 4 tablets daily x 2 days, then 3 tabs daily x 2 days, then 2 tabs daily x 2 days, then 1 tab daily for 2 days then stop.  #20 x 0   Entered and Authorized by:   Lemont Fillers FNP   Signed by:   Lemont Fillers FNP on 07/11/2010   Method used:   Electronically to        [facility] [phone]* (retail)       [address]       [place], [place]  [address]       Ph: [phone] or [phone]       Fax: [phone]   RxID:   [id number] ZITHROMAX Z-PAK 250 MG TABS (AZITHROMYCIN) 2 tabs by mouth x 1 today, then one tablet by mouth daily x 4 more days  #1 pack x 0   Entered and Authorized by:   Lemont Fillers FNP   Signed by:   Lemont Fillers FNP on 07/11/2010   Method used:   [facility]ronically to        [facility] [phone]* (retail)       [address]       [place], [place]  [address]       Ph: [phone] or [phone]       Fax: [phone]   RxID:   [id number]   Current Allergies (reviewed today): ! VIOXX   Preventive Care Screening  Last Tetanus Booster:    Date:  07/10/2008    Results:  Historical   Colonoscopy:    Date:  07/14/2005    Results:  normal   Appended Document: wheezing and short of breath chills/mhf     Allergies: 1)  ! Vioxx   Impression & Recommendations:  Problem # 1:  ACUTE BRONCHITIS (ICD-466.0)  Her updated medication list for this problem includes:    Zithromax Z-pak 250 Mg Tabs (Azithromycin) .Marland Kitchen... 2 tabs by mouth x 1 today, then one tablet by mouth daily x 4 more days    Proair Hfa 108 (90 Base) Mcg/act Aers  (Albuterol sulfate) .Marland Kitchen... 2 puffs every 6 hours for 1 week, then as needed  Orders: Prescription Created Electronically [phone]  Complete Medication List: 1)  Metoprolol Tartrate 50 Mg Tabs (Metoprolol tartrate) .... Take one tablet by mouth twice a day 2)  Lisinopril 40 Mg Tabs (Lisinopril) .... Take one tablet by mouth daily 3)  Aspirin 81 Mg Tbec (Aspirin) .... Take one tablet by mouth daily 4)  Zithromax Z-pak 250 Mg Tabs (Azithromycin) .... 2 tabs by mouth x 1 today, then one tablet by mouth daily x 4 more days 5)  Prednisone 10 Mg Tabs (Prednisone) .... Take 4 tablets daily x 2 days, then 3 tabs daily x 2 days, then 2 tabs daily x 2 days, then 1 tab daily for 2 days then stop. 6)  Proair Hfa 108 (90 Base) Mcg/act Aers (Albuterol sulfate) .... 2 puffs every 6 hours for 1 week, then as needed 7)  Tramadol Hcl 50 Mg Tabs (Tramadol hcl) .... One tablet by mouth every 8 hours as needed for severe pain

## 2010-12-30 NOTE — Assessment & Plan Note (Signed)
Summary: [facility]  Medications Added TRAMADOL HCL 50 MG TABS (TRAMADOL HCL) as needed        Visit Type:  6 months follow up Primary Provider:  Seymour Bars DO  CC:  High blood pressure.  History of Present Illness: Ms. Laurie [name] is a pleasant female who has a history of aortic stenosis status post aortic valve replacement in September 2009.  She had a pericardial tissue valve.  She also had a septal myectomy and a right middle lobectomy due to lung CA.  Note, she has a 50% mid LAD by catheterization preoperatively and a 40-60% internal carotid artery stenosis on the left.  She also has a 16-mm aneurysmal dilatation of the right common iliac.I last saw her in June of this year. At that time there was a question of a TIA. Followup carotid Dopplers were performed on May 28, 2009 and showed a 40-59% right and 0-39% left stenosis. Followup is recommended in one year. A followup echocardiogram was also performed that showed vigorous LV function with an intracavitary gradient 4.3 m/s. Her severe asymmetric septal hypertrophy. There was a prosthetic aortic valve. Seen on January 3 with multiple complaints and her blood pressure was elevated. Her Lopressor was increased. Since then she states that her blood pressure has been up and down. She's had occasional dizziness as well as headaches. Her systolic increased to 200 at one time. She denies any dyspnea, chest pain, palpitations or syncope.  Current Medications (verified): 1)  Metoprolol Tartrate 50 Mg Tabs (Metoprolol Tartrate) .... Take 1 Tablet By Mouth Two Times A Day 2)  Lisinopril 40 Mg Tabs (Lisinopril) .... Take One Tablet By Mouth Daily 3)  Aspirin 81 Mg Tbec (Aspirin) .... Take One Tablet By Mouth Daily 4)  Tramadol Hcl 50 Mg Tabs (Tramadol Hcl) .... As Needed  Allergies: 1)  ! * Lyrica 2)  ! Vioxx  Past History:  Past Medical History: Reviewed history from 01/30/2009 and no changes required. iron def anemia (Dr Lesle Reek) GI -- [facility] slow small bowel bleed  EGD/ colon 2007 menopause since age 29. [id number] Bovine Valve 07-2008 Lung Cancer- RML 9-09 shingles 11-09 COPD on CXR 2005 with R apical scaring Cards- [facility] Barry Dienes Atrial Fibrillation-post op Left ICA and right common iliac aneursym Hypertension  Past Surgical History: Reviewed history from 10/24/2008 and no changes required. appy T&A u/s guided aspiration of R breast cyst at the breast clinic 6-05 RML removed Dr Barry Dienes for lung cancer 2009 heart valve surgery 08/28/08 -- starting cardiac rehab at [facility].  Social History: Reviewed history from 09/21/2008 and no changes required. 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  Grown son lives w/ her.  he has brittle diabetes. other son in [place] with 3 girls. Not in a relationship. Smokes 1/2 ppd x 40 yrs. Now stopped.  No regular exercise. Good diet.  Denies ETOH.  Review of Systems       no fevers or chills, productive cough, hemoptysis, dysphasia, odynophagia, melena, hematochezia, dysuria, hematuria, rash, seizure activity, orthopnea, PND, pedal edema, claudication. Remaining systems are negative.   Vital Signs:  Patient profile:   76 year old female Height:      67 inches Weight:      177.50 pounds BMI:     27.90 Pulse rate:   78 / minute Pulse rhythm:   regular Resp:     18 per minute BP sitting:   127 / 69  (left arm) Cuff size:  large  Vitals Entered By: Vikki Ports (December 04, 2009 3:16 PM)  Physical Exam  General:  Well-developed well-nourished in no acute distress.  Skin is warm and dry.  HEENT is normal.  Neck is supple. No thyromegaly.  Chest is clear to auscultation with normal expansion. status post sternotomy Cardiovascular exam is regular rate and rhythm.  2/6 systolic murmur left sternal border. No diastolic murmur. Abdominal exam nontender or distended. No masses palpated. Extremities show no edema. neuro grossly  intact    EKG  Procedure date:  12/02/2009  Findings:      Sinus rhythm, left ventricular hypertrophy with repolarization abnormalities.  Impression & Recommendations:  Problem # 1:  CAROTID ARTERY DISEASE (ICD-433.10) Continue aspirin. She has not tolerated statins in the past. Followup carotid Dopplers in June of 2011. Her updated medication list for this problem includes:    Aspirin 81 Mg Tbec (Aspirin) .Marland Kitchen... Take one tablet by mouth daily  Problem # 2:  CAD (ICD-414.00) Continue aspirin, beta blocker and ACE inhibitor. Intolerant to statins. Her updated medication list for this problem includes:    Metoprolol Tartrate 50 Mg Tabs (Metoprolol tartrate) .Marland Kitchen... Take 1 tablet by mouth two times a day    Lisinopril 40 Mg Tabs (Lisinopril) .Marland Kitchen... Take one tablet by mouth daily    Aspirin 81 Mg Tbec (Aspirin) .Marland Kitchen... Take one tablet by mouth daily  Problem # 3:  DYSLIPIDEMIA (ICD-272.4) Intolerant to statins. Continue diet.  Problem # 4:  AORTIC VALVE REPLACEMENT, HX OF (ICD-V43.3) Continued SBE prophylaxis.  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 Cornelius Moras.  Problem # 6:  ESSENTIAL HYPERTENSION, BENIGN (ICD-401.1) Patient is having episodes of blood pressure spikes by her report. Her blood pressure today is normal. Holding no changes in her medications. She will track this at home and bring records with her  at next visit. I will schedule a CTA of her renals to exclude renal artery stenosis. Her updated medication list for this problem includes:    Metoprolol Tartrate 50 Mg Tabs (Metoprolol tartrate) .Marland Kitchen... Take 1 tablet by mouth two times a day    Lisinopril 40 Mg Tabs (Lisinopril) .Marland Kitchen... Take one tablet by mouth daily    Aspirin 81 Mg Tbec (Aspirin) .Marland Kitchen... 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 Orders: CT Scan  (CT  Scan)  Patient Instructions: 1)  Your physician recommends that you schedule a follow-up appointment in: 8 WEEKS 2)  Non-Cardiac CT Angiography (CTA), is a special type of CT scan that uses a computer to produce multi-dimensional views of major blood vessels throughout the body. In CT angiography, a contrast material is injected through an IV to help visualize the blood vessels.

## 2010-12-30 NOTE — Progress Notes (Signed)
  Phone Note Outgoing Call   Call placed by: Lemont Fillers FNP,  September 02, 2010 2:52 PM Call placed to: Patient Summary of Call: Patient was contacted to review results of ultrasound, left message for her to review my call.  Initial call taken by: Lemont Fillers FNP,  September 02, 2010 2:52 PM  Follow-up for Phone Call        patient returned call to [facility]. She states she can be reached at her home number at [phone]. Follow-up by: Glendell Docker CMA,  September 02, 2010 3:32 PM  Additional Follow-up for Phone Call Additional follow up Details #1::        Called pt, reviewed labs/ultrasound results.  She is feeling a bit better.  Pt instructed to follow up in 2 weeks. (Has history of colonoscopy 4 yrs ago Dr. Eliberto Ivory. ) Additional Follow-up by: Lemont Fillers FNP,  September 02, 2010 4:55 PM

## 2010-12-30 NOTE — Assessment & Plan Note (Signed)
Summary: HEAD HURTING,DIZZY/WB   Vital Signs:  Patient Profile:   76 Years Old Female CC:      Dizzy, lightheaded, numbness in jaw, headache,  B/P 200/110 last night Height:     67 inches Weight:      181 pounds O2 Sat:      95 % O2 treatment:    Room Air Temp:     97.1 degrees F oral Pulse rate:   70 / minute Pulse rhythm:   regular Resp:     20 per minute BP sitting:   164 / 97  (right arm) Cuff size:   regular  Vitals Entered By: Emilio Math (December 02, 2009 12:01 PM)                  Current Allergies: ! * LYRICA ! VIOXXHistory of Present Illness Chief Complaint: Dizzy, lightheaded, numbness in jaw, headache History of Present Illness: Very anxious 76 year old lady.   About 3 hours ago she had an episode of dizziness and lightheadedness.   She also had associated jaw tingling.  No arm pain or numbness.   She does have some nausea.   She denies any chest pain or shortness of breath.    Complains of headache.  No vision changes.   No weakness.     She took her blood pressure at home and got 200/110.  BP is improved here in our clinic without any treatment.   164/97.   Her blood prerssure is normally below 140/90.  She has not missed any blood pressure medication.   She takes 40mg  lisinopril and metoprolol twice a day.    Had a previous cardiac history of Aortic valve replacement in 2009.   Her last cardiac cath was September 2009 and no invasive intervention was recommended at that time.   She also has had lung cancer and had a right middle lobe lung resection in 2009.   She see Dr. Jens Som and Dr. Cornelius Moras about every 6 months.   She has an appt with Dr. Jens Som in 2 days.    REVIEW OF SYSTEMS       Gastrointestinal       Complains of stomach pain. Neurological       Complains of headaches and numbness.    Past History:  Past Medical History: Reviewed history from 01/30/2009 and no changes required. iron def anemia (Dr Lesle Reek) GI -- [facility] slow small bowel bleed    EGD/ colon 2007 menopause since age 3. [id number] Bovine Valve 07-2008 Lung Cancer- RML 9-09 shingles 11-09 COPD on CXR 2005 with R apical scaring Cards- [facility] Barry Dienes Atrial Fibrillation-post op Left ICA and right common iliac aneursym Hypertension  Past Surgical History: Reviewed history from 10/24/2008 and no changes required. appy T&A u/s guided aspiration of R breast cyst at the breast clinic 6-05 RML removed Dr Barry Dienes for lung cancer 2009 heart valve surgery 08/28/08 -- starting cardiac rehab at [facility].  Family History: Reviewed history from 06/06/2008 and no changes required. mother died at 74, old age father died at 58, heart dz 3 sisters -- unknown. 1 brother died, accident  Social History: Reviewed history from 09/21/2008 and no changes required. 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  Grown son lives w/ her.  he has brittle diabetes. other son in [place] with 3 girls. Not in a relationship. Smokes 1/2 ppd x 40 yrs. Now stopped.  No regular exercise. Good diet.  Denies ETOH.  Physical Exam General appearance: well developed, well nourished, no acute distress Head: normocephalic, atraumatic Pupils: equal, round, reactive to light Ears: normal, no lesions or deformities Neck: no carotid bruit Chest/Lungs: no rales, wheezes, or rhonchi bilateral, breath sounds equal without effort Heart: regular rate and  rhythm,    systolic Murmur noteed.  Neurological: grossly intact and non-focal  Plan New Orders: Est. Patient Level III [53614] Planning Comments:   EKG unchanged from 6 months ago.   I reviewed her previous medical history and discussed the case with Dr. Cathey Endow i think she is having a hypertensive urgency.   Recommend increasing metoprolol to 100 two times a day and gave tramadol for headache one every 8 hours She will follow up with Dr. Jens Som at her scheduled appt in 2 days I warned her to seek emergency care immediately if she  develops chest pain, weakness, worsening headache, arm or jaw pain.   The patient and/or caregiver has been counseled thoroughly with regard to medications prescribed including dosage, schedule, interactions, rationale for use, and possible side effects and they verbalize understanding.  Diagnoses and expected course of recovery discussed and will return if not improved as expected or if the condition worsens. Patient and/or caregiver verbalized understanding.

## 2010-12-30 NOTE — Assessment & Plan Note (Signed)
Summary: medicare wellness with pap flu shot/mhf--Rm 4   Vital Signs:  Patient profile:   76 year old female Height:      67 inches Weight:      179 pounds BMI:     28.14 Temp:     97.9 degrees F oral Pulse rate:   78 / minute Pulse rhythm:   regular Resp:     18 per minute BP sitting:   118 / 72  (right arm) Cuff size:   regular  Vitals Entered By: Mervin Kung CMA Duncan Dull) (September 15, 2010 11:17 AM) CC: Rm 4  Pt here for Medicare wellness exam. Is Patient Diabetic? No Pain Assessment Patient in pain? no       Does patient need assistance? Functional Status Self care, Cook/clean, Social activities Ambulation Normal Comments Some difficulty climbing stairs due to joint stiffness Pt would like flu shot today. Pt agrees all med doses and directions are correct. Nicki Guadalajara Fergerson CMA Duncan Dull)  September 15, 2010 11:45 AM   Vision Screening:Left eye w/o correction: 20 / 50 Right Eye w/o correction: 20 / 40 Both eyes w/o correction:  20/ 50       Vision Comments: Pt states she has had cataracts.  Vision Entered By: Mervin Kung CMA Duncan Dull) (September 15, 2010 11:48 AM) 40db HL: Left  Right  Audiometry Comment: Has had formal hearing evaluation- told she needs hearing aids. Can't afford.      Primary Care Provider:  Seymour Bars DO  CC:  Rm 4  Pt here for Medicare wellness exam..  History of Present Illness: Here for Medicare AWV:  1.   Risk factors based on Past M, S, F history: declines zoster shot 2.   Physical Activities:  reports fairly active.   3.   Depression/mood: Denies depression, Denies significant anxiety 4.   Hearing: See nursing assessment 5.   ADL's: See nursing assessment 6.   Fall Risk: See nursing assessment 7.   Home Safety: see nursing assesment 8.   Height, weight, &visual acuity: BMI is elevated, see eye exam results 9.   Counseling: Patient was counseled on importance of weight loss, exercise, mammogram, and dexa scan 10.   Labs ordered  based on risk factors:  See assessment and plan 11.           Referral Coordination- Already established with thoracic surgion due to hx of lung cancer 12.           Care Plan- see assessment and plan. 13.            Cognitive Assessment- patient A and O x 3, no difficulty with memory, alertness, or attention span. Dr. Horald Chestnut- sees him annually for f/u Dr. Jens Som every 6 months for CAD  Preventive Screening-Counseling & Management  Alcohol-Tobacco     Smoking Status: quit     Smoking Cessation Counseling: yes     Year Started: 1960  Allergies: 1)  ! Vioxx  Past History:  Past Medical History: Last updated: 01/29/2010 iron def anemia (Dr Lesle Reek) slow small bowel bleed  Bovine Valve 07-2008 for aortic stenosis Lung Cancer- RML 9-09 shingles 11-09 COPD on CXR 2005 with R apical scaring Atrial Fibrillation-post op Left ICA and right common iliac aneursym Hypertension Cerebrovascular disease  Past Surgical History: Last updated: 10/24/2008 appy T&A u/s guided aspiration of R breast cyst at the breast clinic 6-05 RML removed Dr Barry Dienes for lung cancer 2009 heart valve surgery 08/28/08 -- starting cardiac rehab at  [facility].  Review of Systems       Constitutional: Denies Fever ENT:  mild  nasal congestion in AM (clear) or sore throat. Resp: Denies cough CV:  Denies Chest Pain or SOB GI:  Denies nausea or vomitting, notes resolution of RUQ pain since her constipation resolved. GU: Denies dysuria or incontinence Lymphatic: Denies lymphadenopathy Musculoskeletal:  mild joint pain with weather Skin:  Denies Rashes Psychiatric: Denies depression  Neuro: Denies numbness     Physical Exam  General:  Well-developed,well-nourished,in no acute distress; alert,appropriate and cooperative throughout examination Head:  Normocephalic and atraumatic without obvious abnormalities. No apparent alopecia or balding. Breasts:  No mass, nodules, thickening, tenderness,  bulging, retraction, inflamation, nipple discharge or skin changes noted.   Lungs:  Normal respiratory effort, chest expands symmetrically. Lungs are clear to auscultation, no crackles or wheezes. Heart:  Grade 1-2 systolic murmur, RRR Genitalia:  Pelvic Exam:        External: normal female genitalia without lesions or masses        Vagina: normal without lesions or masses        Cervix: normal without lesions or masses        Adnexa: normal bimanual exam without masses or fullness        Uterus: normal by palpation        Pap smear: performed Extremities:  No clubbing, cyanosis, edema, or deformity noted    Impression & Recommendations:  Problem # 1:  PREVENTIVE HEALTH CARE (ICD-V70.0) Assessment Comment Only  Patient is up to date on colonoscopy, tetanus, pneumovax.  Declines Zostavax.  Flu shot given today, Pap performed today.  Will also refer for mammogram and dexa scan (has never had bone density test before).  She declines addition of calcium supplement due to problems with constipation.  Patient was counseled on home saftey, diet, exercise, and weight loss.  Will plan to order f/u FLP, BMET, and CBC- pt will return fasting.  Pt was instructed to keep her annual follow up with Dr. Horald Chestnut as well as her scheduled follow up with Dr. Jens Som.  Contineu Beta blocker and aspirin given history of CAD and carotid artery disease.  Make srue that her LDL is at goal as well.  A recommended list of preventative care services was provide in writing to the patient.    Orders: Medicare -1st Annual Wellness Visit [phone])  Complete Medication List: 1)  Metoprolol Tartrate 50 Mg Tabs (Metoprolol tartrate) .... Take one tablet by mouth twice a day 2)  Lisinopril 40 Mg Tabs (Lisinopril) .... Take one tablet by mouth daily 3)  Aspirin 81 Mg Tbec (Aspirin) .... Take one tablet by mouth daily 4)  Tramadol Hcl 50 Mg Tabs (Tramadol hcl) .... One tablet by mouth three times a day as needed for  severe pain 5)  Alprazolam 0.25 Mg Tabs (Alprazolam) .... One tablet by mouth at bedtime as needed  Other Orders: Mammogram (Screening) (Mammo) TLB-Lipid Panel (80061-LIPID) TLB-BMP (Basic Metabolic Panel-BMET) (80048-METABOL) Flu Vaccine 55yrs + MEDICARE PATIENTS (U0454) Administration Flu vaccine - MCR (G0008) Misc. Referral (Misc. Ref)  Patient Instructions: 1)  Please schedule a follow up appointment with your eye doctor. 2)  You will be contacted about your bone density. 3)  Please schedule your mammogram downstairs today. 4)  Return fasting to the lab to complete your cholesterol and blood work. 5)  Please follow up in 3 months.   Orders Added: 1)  Mammogram (Screening) [Mammo] 2)  TLB-Lipid Panel [80061-LIPID] 3)  TLB-BMP (Basic Metabolic Panel-BMET) [80048-METABOL] 4)  Flu Vaccine 22yrs + MEDICARE PATIENTS [Q2039] 5)  Administration Flu vaccine - MCR [G0008] 6)  Misc. Referral [Misc. Ref] 7)  Medicare -1st Annual Wellness Visit [G0438]    Current Allergies (reviewed today): ! VIOXX     Flu Vaccine Consent Questions     Do you have a history of severe allergic reactions to this vaccine? no    Any prior history of allergic reactions to egg and/or gelatin? no    Do you have a sensitivity to the preservative Thimersol? no    Do you have a past history of Guillan-Barre Syndrome? no    Do you currently have an acute febrile illness? no    Have you ever had a severe reaction to latex? no    Vaccine information given and explained to patient? yes    Are you currently pregnant? no    Lot Number:[id number]   Exp Date:05/30/2011   Site Given  Left Deltoid IM. Nicki Guadalajara Fergerson CMA Duncan Dull)  September 15, 2010 1:54 PM

## 2010-12-30 NOTE — Progress Notes (Signed)
Summary: pt wants to be seen asap  Phone Note Call from Patient Call back at Home Phone [phone]   Caller: Patient Reason for Call: Talk to Nurse, Talk to Doctor Summary of Call: pt is still weak and her heart rate is low and and she wants to be seen asap. pt said Stanton Kidney told her she could see someone else if she dosn't feel better Initial call taken by: Omer Jack,  May 08, 2010 10:02 AM  Follow-up for Phone Call        spoke with pt. Patient states she has been feeling weak for some time, neck stiffness, nervous.. Pt. states her  heart rate was 61 beats/min yesterday. I asked to take vs while talking to her. B/P 157/86 HR 70 beats/min. I let pt. konw HR in of 70 in normal. Pt. states has in [facility] with similar symptoms. They could not find anything. Pt. states has a history of iron deficiency. She was f/u by Hematology. I Adviced pt. to call and make an appointment to be seen maybe her  iron in blood  is low. Pt. agreed. Pt. has an appointment with Dr. Jens Som on Tuesday June 14th. Adviced to keep appointment. Follow-up by: Ollen Gross, RN, BSN,  May 08, 2010 12:28 PM

## 2010-12-30 NOTE — Progress Notes (Signed)
Summary: labs  Phone Note From Other Clinic   Caller: nurse tina Reason for Call: Need Referral Information Summary of Call: Pt having Ct angiogram of abdomen tomorrow am needs recent labs within last 3 months bun & creatnine. Has she had this done. If she needs to have them done they need to be stat. ofc [id number] Initial call taken by: Edman Circle,  December 05, 2009 9:05 AM  Follow-up for Phone Call        spoke with tina at [facility], pt having labs done today at dr Abbe [place] office. tina given the number to his office Deliah Goody, RN  December 05, 2009 9:18 AM

## 2010-12-30 NOTE — Progress Notes (Signed)
Summary: Pt very weak  Phone Note Call from Patient Call back at Home Phone [phone]   Caller: Patient Summary of Call: Pt weak and can't walk due to being weak has been in [facility] hospital  from 04/24/10-04/27/28/11 Initial call taken by: Judie Grieve,  Apr 29, 2010 10:31 AM  Follow-up for Phone Call        spoke with pt, she had been feeling great and suddenly on friday she became very dizzy and was unable to keep her balance. she became nauseated, diaphoretic and vomiting. her son took her to [facility] and she was admitted until sunday. she cont to have lightheadedness and trouble with her balance. her bp yesterday was 157/75 with pulse of 80. she wants me to get the records from [facility] and let her know what to do next. records requested Deliah Goody, RN  Apr 29, 2010 10:49 AM   Additional Follow-up for Phone Call Additional follow up Details #1::        records from [facility] reviewed with dr taylor. follow up appt made. pt will cont to track her bp and call with any changes prior to her follow up appt Deliah Goody, RN  Apr 29, 2010 1:53 PM

## 2010-12-30 NOTE — Letter (Signed)
   [facility] [address] [place], [place]  [address]  [place] Phone: [phone]      September 19, 2010   [facility] [address] [place], [place] [address]  RE:  LAB RESULTS  Dear  Ms. [facility],  The following is an interpretation of your most recent lab tests.  Please take note of any instructions provided or changes to medications that have resulted from your lab work.  Pap Smear: normal     Appended Document:  Mailed.

## 2010-12-30 NOTE — Assessment & Plan Note (Signed)
Summary: UNDER RIB CAGE HURTS/MHF--Rm 4   Vital Signs:  Patient profile:   76 year old female Height:      67 inches Weight:      181.25 pounds BMI:     28.49 O2 Sat:      97 % on Room air Temp:     97.9 degrees F oral Pulse rate:   78 / minute Pulse rhythm:   regular Resp:     18 per minute BP sitting:   140 / 80  (right arm) Cuff size:   regular  Vitals Entered By: Mervin Kung CMA Duncan Dull) (September 01, 2010 2:27 PM)  O2 Flow:  Room air CC: Rm 4  Pt states she has pain under her right rib and back x 1 week. Feels bloated all the time but doesn't have gas. Feels like there is a bulge in her upper abdomen. Is Patient Diabetic? No Pain Assessment Patient in pain? yes     Location: right rib and back Intensity: 7 Type: sharp Comments Would like to discuss vaginal discharge.   Pt has completed Zpak, prednisone, proair and tramadol.   Mervin Kung CMA Duncan Dull)  September 01, 2010 2:37 PM    Primary Care Meerab Maselli:  Seymour Bars DO  CC:  Rm 4  Pt states she has pain under her right rib and back x 1 week. Feels bloated all the time but doesn't have gas. Feels like there is a bulge in her upper abdomen.Marland Kitchen  History of Present Illness: Laurie [name] is a 76 year old female who presents with complaint of pain beneath her right rib cage.  She is s/p RML lobectomy.  Notes that pain is worst when she tries to sleep.  Has sensation of fullness beneath the right rib cage all the way down to the right side of her abdomen.  Denies fever, nausea or vomitting.  Pain is constant- started about 2 weeks ago.  Not made worse by deep inspiration. Notes that she has been stressed by her ill son.  Pain is improved by nothing. BM's- had normal BM yesterday, denies black/bloody or tar-like stools.    Allergies: 1)  ! Vioxx  Physical Exam  General:  Well-developed,well-nourished,in no acute distress; alert,appropriate and cooperative throughout examination Head:  Normocephalic and atraumatic without  obvious abnormalities. No apparent alopecia or balding. Lungs:  Normal respiratory effort, chest expands symmetrically. Lungs are clear to auscultation, no crackles or wheezes. Heart:  Normal rate and regular rhythm. S1 and S2 normal without gallop, murmur, click, rub or other extra sounds. Abdomen:  Negative murphy's sign.  Abdomen is soft,  non-distended, + BS's, mild right sided abdominal tenderness without guarding.    Impression & Recommendations:  Problem # 1:  RUQ PAIN (ICD-789.01) Assessment New Will check below labs as well as abdominal ultrasound.  If this work up is negative, will plan to check a CT of the chest given her history-  (She declined CT today).  Pt was instructed to follow up in 2 weeks, sooner if symptoms worsen or do not improve.  Orders: TLB-CBC Platelet - w/Differential (85025-CBCD) TLB-Hepatic/Liver Function Pnl (80076-HEPATIC) T-Amylase ([id number]) T-Lipase ([id number]) Misc. Referral (Misc. Ref) Prescription Created Electronically ([phone])  Complete Medication List: 1)  Metoprolol Tartrate 50 Mg Tabs (Metoprolol tartrate) .... Take one tablet by mouth twice a day 2)  Lisinopril 40 Mg Tabs (Lisinopril) .... Take one tablet by mouth daily 3)  Aspirin 81 Mg Tbec (Aspirin) .... Take one tablet by mouth daily  4)  Tramadol Hcl 50 Mg Tabs (Tramadol hcl) .... One tablet by mouth three times a day as needed for severe pain 5)  Alprazolam 0.25 Mg Tabs (Alprazolam) .... One tablet by mouth at bedtime as needed  Patient Instructions: 1)  Please complete your lab work downstairs today. 2)  Follow up in 2 weeks, sooner if symptoms worsen.  Prescriptions: ALPRAZOLAM 0.25 MG TABS (ALPRAZOLAM) one tablet by mouth at bedtime as needed  #30 x 0   Entered and Authorized by:   Lemont Fillers FNP   Signed by:   Lemont Fillers FNP on 09/01/2010   Method used:   Print then Give to Patient   RxID:   [id number] TRAMADOL HCL 50 MG TABS (TRAMADOL HCL) one  tablet by mouth three times a day as needed for severe pain  #20 x 0   Entered and Authorized by:   Lemont Fillers FNP   Signed by:   Lemont Fillers FNP on 09/01/2010   Method used:   Electronically to        [facility] [phone]* (retail)       [address]       [place], [place]  [address]       Ph: [phone] or [phone]       Fax: [phone]   RxID:   [id number]   Current Allergies (reviewed today): ! VIOXX

## 2010-12-30 NOTE — Miscellaneous (Signed)
Summary: lab order  Clinical Lists Changes  Orders: Added new Test order of T-Vitamin D (25-Hydroxy) [phone]) - Signed

## 2010-12-30 NOTE — Progress Notes (Signed)
----   Converted from flag ---- ---- 09/29/2010 2:08 PM, Mervin Kung CMA (AAMA) wrote: Order faxed.  ---- 09/29/2010 11:53 AM, Lemont Fillers FNP wrote: please fax vitamin D level to [facility] lab ([phone]) Thanks ------------------------------

## 2010-12-30 NOTE — Assessment & Plan Note (Signed)
Summary: NEW TO EST/MHF--Rm  6   Vital Signs:  Patient profile:   76 year old female Height:      67 inches Weight:      181.50 pounds BMI:     28.53 Temp:     98.1 degrees F oral Pulse rate:   78 / minute Pulse rhythm:   regular Resp:     16 per minute BP sitting:   120 / 74  (right arm) Cuff size:   large  Vitals Entered By: Mervin Kung CMA (06-09-2010 10:11 AM) Is Patient Diabetic? No   Primary Care Provider:  Seymour Bars DO   History of Present Illness: Laurie [name] is a 76 year old female with multiple medical problems who presnts to establish care. Notes episode of dizziness, nausea and vomitting during the end of may.  She reports that she went to [facility] where she reports that she underwent CT head, echo, carotid duplex which were reportedly normal.  Notes that she has HA's, feels unsteady on her feet, feels weak, feels light headed.  She notes that she feels fine when she is laying flat.  Notes that she starts to feel worse upon walking.  Feels like she is shuffling when she walks.  Notes occasional abdominal bloating.    Allergies: 1)  ! Vioxx  Past History:  Past Medical History: Last updated: 01/29/2010 iron def anemia (Dr Lesle Reek) slow small bowel bleed  Bovine Valve 07-2008 for aortic stenosis Lung Cancer- RML 9-09 shingles 11-09 COPD on CXR 2005 with R apical scaring Atrial Fibrillation-post op Left ICA and right common iliac aneursym Hypertension Cerebrovascular disease  Past Surgical History: Last updated: 10/24/2008 appy T&A u/s guided aspiration of R breast cyst at the breast clinic 6-05 RML removed Dr Barry Dienes for lung cancer 2009 heart valve surgery 08/28/08 -- starting cardiac rehab at [facility].  Family History: Last updated: Jun 09, 2010 mother died at 67, old age father died at 10, stroke 3 sisters -- unknown. 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 Widowed  x 40+ yrs.  Grown son lives w/ her.  he has brittle diabetes. other son in [place] with 3 girls. Not in a relationship. Smokes 1/2 ppd x 40 yrs. Now stopped.  No regular exercise. Good diet.  Denies ETOH.  Risk Factors: Smoking Status: current (06/06/2008)  Family History: mother died at 49, old age father died at 24, stroke 3 sisters -- unknown. 1 brother died, accident  Physical Exam  General:  Well-developed,well-nourished,in no acute distress; alert,appropriate and cooperative throughout examination Head:  Normocephalic and atraumatic without obvious abnormalities. No apparent alopecia or balding. Ears:  slight erythema noted of right TM Lungs:  Normal respiratory effort, chest expands symmetrically. Lungs are clear to auscultation, no crackles or wheezes. Heart:  Normal rate and regular rhythm. S1 and S2 normal without gallop, murmur, click, rub or other extra sounds. Neurologic:  No cranial nerve deficits noted. Station and gait are normal. Plantar reflexes are down-going bilaterally. DTRs are symmetrical throughout. Sensory, motor and coordinative functions appear intact.   Impression & Recommendations:  Problem # 1:  DIZZINESS (ICD-780.4) Assessment New  122/70 hr 79 laying 128/70 sitting 122/70 hr 71 standing  Orthostatics are negative.  I did recommend to the patient that she complete an MRI of the brain, and explained to her that this would help [place] evaluate her more thoroughly for stroke or aneurysm.  She declines at  this time but will think about it.  In the meantime, will give trial of meclizine for ? BPV. Her updated medication list for this problem includes:    Meclizine Hcl 25 Mg Tabs (Meclizine hcl) ..... One tablet by mouth three times daily as needed for dizziness  Orders: Prescription Created Electronically [phone])  Problem # 2:  OTITIS MEDIA, RIGHT (ICD-382.9) Assessment: New  Mild erythema right TM- it is possible that she has an OM which is  contributing to her dizziness.  Will treat. Her updated medication list for this problem includes:    Aspirin 81 Mg Tbec (Aspirin) .Marland Kitchen... Take one tablet by mouth daily    Amoxicillin 500 Mg Cap (Amoxicillin) .Marland Kitchen... Take 1 capsule by mouth three times a day x 10 days  Orders: Prescription Created Electronically [phone])  Complete Medication List: 1)  Metoprolol Tartrate 50 Mg Tabs (Metoprolol tartrate) .... Take one tablet by mouth twice a day 2)  Lisinopril 40 Mg Tabs (Lisinopril) .... Take one tablet by mouth daily 3)  Aspirin 81 Mg Tbec (Aspirin) .... Take one tablet by mouth daily 4)  Tramadol Hcl 50 Mg Tabs (Tramadol hcl) .... One tablet by mouth 3 times daily as needed for ha/neck pain not relieved by tylenol 5)  Amoxicillin 500 Mg Cap (Amoxicillin) .... Take 1 capsule by mouth three times a day x 10 days 6)  Meclizine Hcl 25 Mg Tabs (Meclizine hcl) .... One tablet by mouth three times daily as needed for dizziness  Patient Instructions: 1)  Please follow up in 2 weeks, sooner if symptoms worsen or do not improve. Prescriptions: MECLIZINE HCL 25 MG TABS (MECLIZINE HCL) one tablet by mouth three times daily as needed for dizziness  #30 x 0   Entered and Authorized by:   Lemont Fillers FNP   Signed by:   Lemont Fillers FNP on 05/21/2010   Method used:   [facility]ron[facility] to        [facility] [phone]* (retail)       [address]       [place], [place]  [address]       Ph: [phone] or [phone]       Fax: [phone]   RxID:   [id number] AMOXICILLIN 500 MG CAP (AMOXICILLIN) Take 1 capsule by mouth three times a day X 10 days  #30 x 0   Entered and Authorized by:   Lemont Fillers FNP   Signed by:   Lemont Fillers FNP on 05/21/2010   Method used:   Electronically to        [facility] [phone][facility] (retail)       [address]       [place], [place]  [address]       Ph: [phone] or [phone]       Fax: [phone]   RxID:    [id number] TRAMADOL HCL 50 MG TABS (TRAMADOL HCL) one tablet by mouth 3 times daily as needed for HA/neck pain not relieved by tylenol  #30 x 0   Entered and Authorized by:   Lemont Fillers FNP   Signed by:   Lemont Fillers FNP on 05/21/2010   Method used:   Electronically to        [facility] [phone]* (retail)       [address]       [place], [place]  [address]       Ph:  [phone] or [phone]       Fax: [phone]   RxID:   [id number]   Current Allergies (reviewed today): ! VIOXX    Vital Signs:  Patient Profile:   76 year old female Height:     67 inches Weight:      181.50 pounds BMI:     28.53 Temp:     98.1 degrees F oral Pulse rate:   78 / minute Pulse rhythm:   regular Resp:     16 per minute BP sitting:   120 / 74 Cuff size:   large                    Immunization History:  Influenza Immunization History:    Influenza:  historical (10/25/2009)  Pneumovax Immunization History:    Pneumovax:  historical (07/31/2008)

## 2010-12-30 NOTE — Assessment & Plan Note (Signed)
Summary: [facility]   Visit Type:  Post-hospital Primary Provider:  Seymour Bars DO   History of Present Illness: Laurie [name] is a pleasant female who has a history of aortic stenosis status post aortic valve replacement in September 2009.  She had a pericardial tissue valve.  She also had a septal myectomy and a right middle lobectomy due to lung CA.  Note, she has a 50% mid LAD by catheterization preoperatively.  She also has a 16-mm aneurysmal dilatation of the right common iliac. Note she had followup CTA of her iliacs in January of 2011. There was moderate stenosis of the right iliac with mild poststenotic dilatation.   A followup echocardiogram in June 2010 revealed  vigorous LV function with an intracavitary gradient 4.3 m/s. There was severe asymmetric septal hypertrophy. There was a prosthetic aortic valve. I last saw her in March of 2011. She was recently admitted to [facility] in May with nausea and dizziness. Followup carotid Dopplers on Apr 28, 2010 revealed a 40-59% left and 1-39% right stenosis. An echocardiogram on Apr 26, 2010 showed normal LV function, no aortic stenosis, no aortic insufficiency and mild tricuspid regurgitation. Since then she has included somewhat. However she does have residual weakness. There is no dyspnea , chest pain, pedal edema or syncope. There is no fevers or chills.                 Current Medications (verified): 1)  Metoprolol Tartrate 25 Mg Tabs (Metoprolol Tartrate) .... Take One Tablet By Mouth Twice A Day 2)  Lisinopril 40 Mg Tabs (Lisinopril) .... Take One Tablet By Mouth Daily 3)  Aspirin 81 Mg Tbec (Aspirin) .... Take One Tablet By Mouth Daily  Allergies: 1)  ! Vioxx  Past History:  Past Medical History: Reviewed history from 01/29/2010 and no changes required. iron def anemia (Dr Lesle Reek) slow small bowel bleed  Bovine Valve 07-2008 for aortic stenosis Lung Cancer- RML 9-09 shingles 11-09 COPD on CXR  2005 with R apical scaring Atrial Fibrillation-post op Left ICA and right common iliac aneursym Hypertension Cerebrovascular disease  Past Surgical History: Reviewed history from 10/24/2008 and no changes required. appy T&A u/s guided aspiration of R breast cyst at the breast clinic 6-05 RML removed Dr Barry Dienes for lung cancer 2009 heart valve surgery 08/28/08 -- starting cardiac rehab at [facility].  Social History: Reviewed history from 09/21/2008 and no changes required. 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  Grown son lives w/ her.  he has brittle diabetes. other son in [place] with 3 girls. Not in a relationship. Smokes 1/2 ppd x 40 yrs. Now stopped.  No regular exercise. Good diet.  Denies ETOH.  Review of Systems       Some residual weakness  butno fevers or chills, productive cough, hemoptysis, dysphasia, odynophagia, melena, hematochezia, dysuria, hematuria, rash, seizure activity, orthopnea, PND, pedal edema, claudication. Remaining systems are negative.   Vital Signs:  Patient profile:   76 year old female Height:      67 inches Weight:      182 pounds BMI:     28.61 Pulse rate:   82 / minute Pulse rhythm:   regular Resp:     18 per minute BP sitting:   153 / 84  (left arm) Cuff size:   large  Vitals Entered By: Vikki Ports (May 13, 2010 2:41 PM)  Physical Exam  General:  Well-developed well-nourished in no acute distress.  Skin is  warm and dry.  HEENT is normal.  Neck is supple. No thyromegaly. Left carotid bruit Chest is clear to auscultation with normal expansion.  Cardiovascular exam is regular rate and rhythm. 2/6 systolic murmur left sternal border. No diastolic murmur. Abdominal exam nontender or distended. No masses palpated. Extremities show no edema. neuro grossly intact    EKG  Procedure date:  05/13/2010  Findings:      Sinus rhythm at a rate of 82. Left ventricular hypertrophy with repolarization  abnormality.  Impression & Recommendations:  Problem # 1:  CAROTID ARTERY DISEASE (ICD-433.10) Continue aspirin. Intolerant to statins. Followup carotid Dopplers June 2012. Her updated medication list for this problem includes:    Aspirin 81 Mg Tbec (Aspirin) .Marland Kitchen... Take one tablet by mouth daily  Her updated medication list for this problem includes:    Aspirin 81 Mg Tbec (Aspirin) .Marland Kitchen... Take one tablet by mouth daily  Problem # 2:  CAD (ICD-414.00) Continue aspirin, ACE inhibitor and beta blocker. Intolerant to statins. Her updated medication list for this problem includes:    Metoprolol Tartrate 50 Mg Tabs (Metoprolol tartrate) .Marland Kitchen... Take one tablet by mouth twice a day    Lisinopril 40 Mg Tabs (Lisinopril) .Marland Kitchen... Take one tablet by mouth daily    Aspirin 81 Mg Tbec (Aspirin) .Marland Kitchen... Take one tablet by mouth daily  Problem # 3:  ILIAC ARTERY ANEURYSM (ICD-442.2) Followup CT showed poststenotic dilatation but no aneurysm.  Problem # 4:  DYSLIPIDEMIA (ICD-272.4) Continue diet. Intolerant to statins.  Problem # 5:  AORTIC VALVE REPLACEMENT, HX OF (ICD-V43.3) Recent echocardiogram shows no aortic insufficiency. Continue SBE prophylaxis.  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  Problem # 7:  ESSENTIAL HYPERTENSION, BENIGN (ICD-401.1) Blood pressure elevated. Increase metoprolol to 50 mg p.o. B.i.d. Her updated medication list for this problem includes:    Metoprolol Tartrate 50 Mg Tabs (Metoprolol tartrate) .Marland Kitchen... Take one tablet by mouth twice a day    Lisinopril 40 Mg Tabs (Lisinopril) .Marland Kitchen... Take one tablet by mouth daily    Aspirin 81 Mg Tbec (Aspirin) .Marland Kitchen... Take one tablet by mouth daily  Patient Instructions: 1)  Your physician recommends that you schedule a follow-up appointment in: 6 MONTHS 2)  Your physician has recommended you make the following change in your medication: INCREASE METOPROLOL TO 50MG  TWICE DAILY

## 2010-12-30 NOTE — Letter (Signed)
Summary: [facility] [place]  [facility] [place]   Imported By: Marylou Mccoy 06/18/2010 11:33:44  _____________________________________________________________________  External Attachment:    Type:   Image     Comment:   External Document

## 2010-12-30 NOTE — Progress Notes (Signed)
  Phone Note Outgoing Call   Summary of Call: Called patient, reviewed findings of osteopenia on dexa.  Will have patient complete vitamin D level.  She wishes to go to the lab at [facility] for a vitamin D level.  Also, discussed possibility of adding Livalo (I discussed with Dr. Jens Som and he agrees with this plan if she is willing to try)- she is concerned about cost.  I told pt that I could give her samples + a co-pay card 25$.  She will consider and let me know what she decides.   Initial call taken by: Lemont Fillers FNP,  September 29, 2010 11:52 AM  New Problems: OSTEOPENIA (ICD-733.90)   New Problems: OSTEOPENIA (ICD-733.90)

## 2011-01-01 NOTE — Progress Notes (Signed)
Summary: livalo samples  Phone Note Outgoing Call   Summary of Call: Please have patient pick up samples for livalo 4mg , 1/2 tablet by mouth daily [id number] exp 07/2012.  After that time, she can fill printed rx for livalo 2mg  and she can take the whole pill once daily.   Initial call taken by: Lemont Fillers FNP,  November 12, 2010 1:24 PM  Follow-up for Phone Call        Notified pt. She states she will try to pick them up this week. Nicki Guadalajara Fergerson CMA (AAMA)  November 12, 2010 3:04 PM     New/Updated Medications: LIVALO 2 MG TABS (PITAVASTATIN CALCIUM) one tablet by mouth once daily Prescriptions: LIVALO 2 MG TABS (PITAVASTATIN CALCIUM) one tablet by mouth once daily  #30 x 2   Entered and Authorized by:   Lemont Fillers FNP   Signed by:   Lemont Fillers FNP on 11/12/2010   Method used:   Print then Give to Patient   RxID:   [id number]

## 2011-01-01 NOTE — Progress Notes (Signed)
  Phone Note Outgoing Call   Call placed by: Lemont Fillers FNP,  November 11, 2010 11:01 AM Call placed to: Patient Details for Reason: elevated A!C Summary of Call: Called patient, reviewed elevated A1C- not officially DM.  Pt instructed to avoid concentrated sweets, eat smaller portions of carbohydrates.  Substitute whole grain whenever possible, increase exercise.  Will monitor.  Pt is aware.  Initial call taken by: Lemont Fillers FNP,  November 11, 2010 11:02 AM

## 2011-01-01 NOTE — Assessment & Plan Note (Signed)
Summary: RASH AT HAIRLINE NECK SORE/[facility]--Rm 5   Vital Signs:  Patient profile:   76 year old female Height:      67 inches Weight:      184.75 pounds BMI:     29.04 Temp:     97.9 degrees F oral Pulse rate:   84 / minute Pulse rhythm:   regular Resp:     16 per minute BP sitting:   142 / 86  (right arm) Cuff size:   regular  Vitals Entered By: Mervin Kung CMA Duncan Dull) (November 10, 2010 11:14 AM) CC: Pt states her neck has been sore x 2 weeks. Has noticed a rash at the back of her neck around the hairline x 1 week. Is Patient Diabetic? No Pain Assessment Patient in pain? no      Comments Pt states she is unable to take Alendronate and Calcium due to constipation. Agrees all other med doses and directions are correct. Nicki Guadalajara Fergerson CMA Duncan Dull)  November 10, 2010 11:25 AM    Primary Care Provider:  Seymour Bars DO  CC:  Pt states her neck has been sore x 2 weeks. Has noticed a rash at the back of her neck around the hairline x 1 week.Marland Kitchen  History of Present Illness: Laurie [name] is a 76 yr old female who presents today for follow up.  1) Hyperlipidemia- she notes intolerance to Crestor in the past stating that this causes some discomfort in her chest.  2) Osteopenia- patient reports that she never started Fosamax due to concerns about GI side effects.   3) neck rash-- posterior neck soreness- rash started >1 week ago.  Denies fever or blisters.  Denies associated itching.   4) Dental pain- Pt notes that she has an upcoming apt with dental-  needs rx for amoxicillin.  She is having pain in the posterior upper molars bilaterally. She reports she has a appointment with her new dentist in January which is the earliest available.   Allergies: 1)  ! Vioxx  Past History:  Past Medical History: Last updated: 01/29/2010 iron def anemia (Dr Lesle Reek) slow small bowel bleed  Bovine Valve 07-2008 for aortic stenosis Lung Cancer- RML 9-09 shingles 11-09 COPD on CXR 2005  with R apical scaring Atrial Fibrillation-post op Left ICA and right common iliac aneursym Hypertension Cerebrovascular disease  Past Surgical History: Last updated: 10/24/2008 appy T&A u/s guided aspiration of R breast cyst at the breast clinic 6-05 RML removed Dr Barry Dienes for lung cancer 2009 heart valve surgery 08/28/08 -- starting cardiac rehab at [facility].  Review of Systems       see history of present illness  Physical Exam  General:  Well-developed,well-nourished,in no acute distress; alert,appropriate and cooperative throughout examination Head:  Normocephalic and atraumatic without obvious abnormalities. See section on skin Mouth:  gingival swelling noted around bilateral posterior upper molars crowns also noted. Lungs:  Normal respiratory effort, chest expands symmetrically. Lungs are clear to auscultation, no crackles or wheezes. Heart:  Normal rate and regular rhythm. S1 and S2 normal +, murmur, grade 2-3/6 Extremities:  No clubbing, cyanosis, edema, or deformity noted Skin:  + flat erythematous rash noted at top of neck base of hair line.  No open areas, no vesicles Psych:  Cognition and judgment appear intact. Alert and cooperative with normal attention span and concentration. No apparent delusions, illusions, hallucinations   Impression & Recommendations:  Problem # 1:  DERMATITIS (ICD-692.9) Assessment New Not certain if this is contact dermatits versus  fungal.  Will give pt trial of Lotrisone to see if this helps. No open lesions/vesicles to suggest Zoster at this time. I also suspect that some of her neck pain is coming from arthritis, she is requesting a refill on her tramadol for this pain.  Problem # 2:  HYPERGLYCEMIA, MILD (ICD-790.29) Assessment: New Will check baseline A1C, son is diabetic.   Orders: T-Hgb A1C ([id number])  Problem # 3:  OSTEOPENIA (ICD-733.90) Assessment: Unchanged discussed possible use of Reclast  in place of alendronate due to her  concerns about GI side effects. She'll consider this and let me know. Her updated medication list for this problem includes:    Alendronate Sodium 70 Mg Tabs (Alendronate sodium) ..... One tablet by mouth once weekly in am with water 30 minutes before meds, food, drink.  sit upright for 30 minutes after taking medication  Problem # 4:  HYPERLIPIDEMIA (ICD-272.4) Assessment: Comment Only patient is willing to consider use of Livalo.  Unfortunately we do not have any samples or coupon cards today. We will try to obtain some samples and coupon cards and we'll contact the patient and these become available. I did discuss the possibility of trying this medication with Dr. Jens Som and he was agreeable with the plan.  Problem # 5:  ABSCESS, TOOTH (ICD-522.5) Assessment: New  Will treat patient with Augmentin. I did instruct her that it's important for her to keep her upcoming dental appointment. She is to contact them for immediate evaluation should her dental pain or swelling worsen.  Orders: Prescription Created Electronically [phone])  Complete Medication List: 1)  Metoprolol Tartrate 50 Mg Tabs (Metoprolol tartrate) .... Take one tablet by mouth twice a day 2)  Lisinopril 40 Mg Tabs (Lisinopril) .... Take one tablet by mouth daily 3)  Aspirin 81 Mg Tbec (Aspirin) .... Take one tablet by mouth daily 4)  Tramadol Hcl 50 Mg Tabs (Tramadol hcl) .... One tablet by mouth three times a day as needed for severe pain 5)  Alprazolam 0.25 Mg Tabs (Alprazolam) .... One tablet by mouth at bedtime as needed 6)  Alendronate Sodium 70 Mg Tabs (Alendronate sodium) .... One tablet by mouth once weekly in am with water 30 minutes before meds, food, drink.  sit upright for 30 minutes after taking medication 7)  Caltrate 600+d Plus 600-400 Mg-unit Tabs (Calcium carbonate-vit d-min) .... One tablet by mouth bid 8)  Augmentin 875-125 Mg Tabs (Amoxicillin-pot clavulanate) .... One tablet by mouth two times a day for 7  days 9)  Lotrisone 1-0.05 % Crea (Clotrimazole-betamethasone) .... Apply twice daily to rash on neck until healed  Patient Instructions: 1)  Please keep your upcoming appointment with your dentist. 2)  Call them sooner if your mouth discomfort worsens, or does not improve. 3)  Complete your blood work on the first floor today. 4)  Follow up in 3 months. Prescriptions: TRAMADOL HCL 50 MG TABS (TRAMADOL HCL) one tablet by mouth three times a day as needed for severe pain  #20 x 0   Entered and Authorized by:   Lemont Fillers FNP   Signed by:   Lemont Fillers FNP on 11/10/2010   Method used:   Electronically to        [facility] [phone]* (retail)       [address]       [place], [place]  [address]       Ph: [phone] or [phone]       Fax:  [phone]   RxID:   [id number] LOTRISONE 1-0.05 % CREA (CLOTRIMAZOLE-BETAMETHASONE) apply twice daily to rash on neck until healed  #1 x 0   Entered and Authorized by:   Lemont Fillers FNP   Signed by:   Lemont Fillers FNP on 11/10/2010   Method used:   Electronically to        [facility] [phone]* (retail)       [address]       [place], [place]  [address]       Ph: [phone] or [phone]       Fax: [phone]   RxID:   [id number] AUGMENTIN 875-125 MG TABS (AMOXICILLIN-POT CLAVULANATE) one tablet by mouth two times a day for 7 days  #14 x 0   Entered and Authorized by:   Lemont Fillers FNP   Signed by:   Lemont Fillers FNP on 11/10/2010   Method used:   Electronically to        [facility] [phone]* (retail)       [address]       [place], [place]  [address]       Ph: [phone] or [phone]       Fax: [phone]   RxID:   [id number]    Orders Added: 1)  T-Hgb A1C [83036-23375] 2)  Est. Patient Level IV [[address]] 3)  Prescription Created Electronically [phone]    Current Allergies (reviewed today): ! VIOXX

## 2011-01-01 NOTE — Assessment & Plan Note (Signed)
Summary: [facility]   Visit Type:  6 months follow up Primary Darshan Solanki:  Seymour Bars DO   History of Present Illness: Laurie [name] is a pleasant female who has a history of aortic stenosis status post aortic valve replacement in September 2009.  She had a pericardial tissue valve.  She also had a septal myectomy and a right middle lobectomy due to lung CA.  Note, she has a 50% mid LAD by catheterization preoperatively.  She also has a 16-mm aneurysmal dilatation of the right common iliac. Note she had followup CTA of her iliacs in January of 2011. There was moderate stenosis of the right iliac with mild poststenotic dilatation. Followup carotid Dopplers on Apr 28, 2010 performed in [facility] revealed a 40-59% left and 1-39% right stenosis. An echocardiogram on Apr 26, 2010 showed normal LV function, no aortic stenosis, no aortic insufficiency and mild tricuspid regurgitation. I last saw her in June of 2011. Since then the patient denies any dyspnea on exertion, orthopnea, PND, pedal edema, palpitations, syncope or chest pain.   Current Medications (verified): 1)  Metoprolol Tartrate 50 Mg Tabs (Metoprolol Tartrate) .... Take One Tablet By Mouth Twice A Day 2)  Lisinopril 40 Mg Tabs (Lisinopril) .... Take One Tablet By Mouth Daily 3)  Aspirin 81 Mg Tbec (Aspirin) .... Take One Tablet By Mouth Daily 4)  Tramadol Hcl 50 Mg Tabs (Tramadol Hcl) .... One Tablet By Mouth Three Times A Day As Needed For Severe Pain 5)  Lotrisone 1-0.05 % Crea (Clotrimazole-Betamethasone) .... Apply Twice Daily To Rash On Neck Until Healed 6)  Livalo 2 Mg Tabs (Pitavastatin Calcium) .... One Tablet By Mouth Once Daily. On Hold  Allergies: 1)  ! Vioxx  Past History:  Past Medical History: iron def anemia (Dr Lesle Reek) slow small bowel bleed  shingles 11-09 COPD on CXR 2005 with R apical scaring Atrial Fibrillation-post op Left ICA and right common iliac aneursym Hypertension Cerebrovascular  disease  Past Surgical History: Reviewed history from 10/24/2008 and no changes required. appy T&A u/s guided aspiration of R breast cyst at the breast clinic 6-05 RML removed Dr Barry Dienes for lung cancer 2009 heart valve surgery 08/28/08 -- starting cardiac rehab at [facility].  Social History: Reviewed history from 09/21/2008 and no changes required. 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  Grown son lives w/ her.  he has brittle diabetes. other son in [place] with 3 girls. Not in a relationship. Smokes 1/2 ppd x 40 yrs. Now stopped.  No regular exercise. Good diet.  Denies ETOH.  Review of Systems       Problems with constipation but no fevers or chills, productive cough, hemoptysis, dysphasia, odynophagia, melena, hematochezia, dysuria, hematuria, rash, seizure activity, orthopnea, PND, pedal edema, claudication. Remaining systems are negative.   Vital Signs:  Patient profile:   76 year old female Height:      67 inches Weight:      183 pounds BMI:     28.77 Pulse rate:   69 / minute Pulse rhythm:   regular Resp:     18 per minute BP sitting:   167 / 90  (left arm) Cuff size:   large  Vitals Entered By: Vikki Ports (November 19, 2010 12:43 PM)  Physical Exam  General:  Well-developed well-nourished in no acute distress.  Skin is warm and dry.  HEENT is normal.  Neck is supple. No thyromegaly.  Chest is clear to auscultation with normal expansion.  Cardiovascular  exam is regular rate and rhythm. 2/6 systolic ejection murmur left sternal border. No diastolic murmur. Abdominal exam nontender or distended. No masses palpated. Extremities show no edema. neuro grossly intact    EKG  Procedure date:  11/19/2010  Findings:      NSR, LVH with repolarization abnormality.  Impression & Recommendations:  Problem # 1:  HYPERLIPIDEMIA (ICD-272.4) Patient has been given livalo by primary care. Hopefully she will tolerate this medication. They are also  checking her lipid liver. Her updated medication list for this problem includes:    Livalo 2 Mg Tabs (Pitavastatin calcium) ..... One tablet by mouth once daily. on hold  Problem # 2:  CAROTID ARTERY DISEASE (ICD-433.10)  Continue aspirin. Followup carotid Dopplers June 2012. Her updated medication list for this problem includes:    Aspirin 81 Mg Tbec (Aspirin) .Marland Kitchen... Take one tablet by mouth daily  Her updated medication list for this problem includes:    Aspirin 81 Mg Tbec (Aspirin) .Marland Kitchen... Take one tablet by mouth daily  Problem # 3:  CAD (ICD-414.00) Continue aspirin, ACE inhibitor and beta blocker. Statin added. Her updated medication list for this problem includes:    Metoprolol Tartrate 50 Mg Tabs (Metoprolol tartrate) .Marland Kitchen... Take one tablet by mouth twice a day    Lisinopril 40 Mg Tabs (Lisinopril) .Marland Kitchen... Take one tablet by mouth daily    Aspirin 81 Mg Tbec (Aspirin) .Marland Kitchen... Take one tablet by mouth daily  Orders: EKG w/ Interpretation (93000)  Her updated medication list for this problem includes:    Metoprolol Tartrate 50 Mg Tabs (Metoprolol tartrate) .Marland Kitchen... Take one tablet by mouth twice a day    Lisinopril 40 Mg Tabs (Lisinopril) .Marland Kitchen... Take one tablet by mouth daily    Aspirin 81 Mg Tbec (Aspirin) .Marland Kitchen... Take one tablet by mouth daily  Problem # 4:  ILIAC ARTERY ANEURYSM (ICD-442.2) Will most likely repeat CTA of her abdominal aorta and iliacs in one year.  Problem # 5:  AORTIC VALVE REPLACEMENT, HX OF (ICD-V43.3) Continue SBE prophylaxis. Repeat echocardiogram when she returns in 6 months.  Problem # 6:  ESSENTIAL HYPERTENSION, BENIGN (ICD-401.1) Blood pressure mildly elevated; however she states normal at home. She will follow this and we will add additional medications as needed. Her updated medication list for this problem includes:    Metoprolol Tartrate 50 Mg Tabs (Metoprolol tartrate) .Marland Kitchen... Take one tablet by mouth twice a day    Lisinopril 40 Mg Tabs (Lisinopril)  .Marland Kitchen... Take one tablet by mouth daily    Aspirin 81 Mg Tbec (Aspirin) .Marland Kitchen... 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  Patient Instructions: 1)  Your physician recommends that you schedule a follow-up appointment in: 6 MONTHS WITH DR CRENSHAW 2)  Your physician recommends that you continue on your current medications as directed. Please refer to the Current Medication list given to you today.

## 2011-01-01 NOTE — Progress Notes (Signed)
Summary: Livalo, abx request?  Phone Note Call from Patient Call back at [facility] Phone [phone]   Caller: Patient Call For: Laurie [name]  Summary of Call: livalo 1/2 of 4 mg tablet.  the card she got that let her get them for $25.00 expired on 12-31.  They will be $40 per month for a 30 day supply.  She needs a different medicine.  She would also like some amoxicillan as her tooth is abcessed and she cannot get into the dentist till  Feb 7th.  [facility] [address], [place]  Initial call taken by: Roselle Locus,  December 02, 2010 11:25 AM  Follow-up for Phone Call        Pt advised she can pick up new discount card. Pt requested that card be mailed to her and I advised her I would put it in the mail. Pt states she has a tooth that needs to be pulled but it is currently infected and she is requesting an antibiotic. States it hurts to chew on that side. Reports that her dentist cannot see her until February and they will not call anything in without seeing her. Please advise. Nicki Guadalajara Fergerson CMA Duncan Dull)  December 02, 2010 11:41 AM   Additional Follow-up for Phone Call Additional follow up Details #1::        Left back molar-  inflammed, worse.  Stomach is bothering her.  Wants Rx for antibiotics.  I told her that I am happy to prescribe abx for her, but that I would need to see her in the office.  She states she doesn't feel like coming out here today and will find someone closer to her home.  I also told pt that I have samples of the livalo available for her to pick up.   Additional Follow-up by: Lemont Fillers FNP,  December 02, 2010 12:18 PM    New/Updated Medications: LIVALO 4 MG TABS (PITAVASTATIN CALCIUM) 1/2 tablet by mouth daily Prescriptions: LIVALO 4 MG TABS (PITAVASTATIN CALCIUM) 1/2 tablet by mouth daily  #28 x 0   Entered and Authorized by:   Lemont Fillers FNP   Signed by:   Lemont Fillers FNP on 12/02/2010   Method used:   Samples Given   RxID:    [id number]

## 2011-01-07 NOTE — Letter (Signed)
Summary: [facility] Office Note   [facility] Office Note   Imported By: Roderic Ovens 01/02/2011 15:30:26  _____________________________________________________________________  External Attachment:    Type:   Image     Comment:   External Document

## 2011-03-12 ENCOUNTER — Other Ambulatory Visit: Payer: Self-pay | Admitting: Cardiology

## 2011-03-17 ENCOUNTER — Encounter: Payer: Self-pay | Admitting: Family Medicine

## 2011-03-18 ENCOUNTER — Encounter: Payer: Self-pay | Admitting: Family Medicine

## 2011-03-18 ENCOUNTER — Ambulatory Visit (INDEPENDENT_AMBULATORY_CARE_PROVIDER_SITE_OTHER): Payer: Medicare Other | Admitting: Family Medicine

## 2011-03-18 DIAGNOSIS — R10A1 Flank pain, right side: Secondary | ICD-10-CM | POA: Insufficient documentation

## 2011-03-18 DIAGNOSIS — D509 Iron deficiency anemia, unspecified: Secondary | ICD-10-CM

## 2011-03-18 DIAGNOSIS — R1013 Epigastric pain: Secondary | ICD-10-CM

## 2011-03-18 DIAGNOSIS — R109 Unspecified abdominal pain: Secondary | ICD-10-CM

## 2011-03-18 LAB — POCT URINALYSIS DIPSTICK
Leukocytes, UA: NEGATIVE
Urobilinogen, UA: 0.2
pH, UA: 7

## 2011-03-18 MED ORDER — ALPRAZOLAM 0.25 MG PO TABS: ORAL_TABLET | ORAL | Status: DC

## 2011-03-18 NOTE — Assessment & Plan Note (Signed)
Epigastric pain, mild to moderate.  Will get labs today to r/o any sign of infection, worsening anemia, pancreatitis, biliary colic, hepatitis.  If labs are normal, will proceed with CT abdomen tomorrow.  If pain worsens, dev. Fever, GI bleeding or nausea, please call or go to the ED.

## 2011-03-18 NOTE — Assessment & Plan Note (Signed)
UA was normal today.

## 2011-03-18 NOTE — Assessment & Plan Note (Signed)
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  Managed by Dr Mathis Bud, on IV iron.  Check labs today and will fax copy to Dr Abbe Amsterdam.

## 2011-03-18 NOTE — Patient Instructions (Signed)
UA is normal today.  Update labs today downstairs. Will call you w/ results tomorrow.  If labs do not reveal cause for pain, will proceed with CT scan tomorrow.  Call if any worsening symptoms occur.  Can use Alprazolam up to 2 x a day for anxiety.

## 2011-03-18 NOTE — Progress Notes (Signed)
  Subjective:    Patient ID: Laurie [name], female    DOB: May 31, 1935, 76 y.o.   MRN: [id number]  HPI  76 yo WF presents for abdominal fullness and RUQ pain and epigastrium that is hurting her.  Her pain radiates to the R flank.  This started about 10 days ago.  She has worsening pain after eating and drinking.  Her appetite is decreased.  She has been constipated for months.  She is using Miralax and drinking prune juice.  She takes miralax about 3 x a wk.  She is now having more regular BMs in this regimen.    Denies nausea, vomitting or diarrhea.  Denies any blood in her stools.  She has a long hx of AVMs which have caused an iron def anemia for which she sees Dr Abbe Amsterdam and has been on IV iron.  She was due to have a CT abdomen earlier this year but she does not remember doing this.   BP 130/76  Pulse 76  Ht 5\' 7"  (1.702 m)  Wt 182 lb (82.555 kg)  BMI 28.51 kg/m2  SpO2 97% Patient Active Problem List  Diagnoses  . MALIGNANT NEOPLASM MIDDLE LOBE BRONCHUS OR LUNG  . THYROMEGALY  . THYROID NODULE  . DYSLIPIDEMIA  . ANEMIA, IRON DEFICIENCY  . ANXIETY STATE, UNSPECIFIED  . ESSENTIAL HYPERTENSION, BENIGN  . CAD  . CAROTID ARTERY DISEASE  . TIA  . ILIAC ARTERY ANEURYSM  . SIALOLITHIASIS  . DIVERTICULOSIS OF COLON  . NECK PAIN  . OSTEOPENIA  . AORTIC VALVE REPLACEMENT, HX OF  . DERMATITIS  . HYPERGLYCEMIA, MILD  . GASTROINTESTINAL HEMORRHAGE, HX OF     Past Surgical History  Procedure Date  . Aapy   . Tonsillectomy and adenoidectomy   . U/s guided aspiration of r breast cyst  2009    at the breast clinic  . Rml removed dr Barry Dienes for lung cancer 2009  . Cardiac valve surgery 9-29-9    starting cardiac rehab at [facility]         Review of Systems  Constitutional: Positive for appetite change and fatigue. Negative for fever, chills and unexpected weight change.  Respiratory: Negative for shortness of breath.   Gastrointestinal: Positive for abdominal pain, constipation and  abdominal distention. Negative for nausea, vomiting, diarrhea and blood in stool.  Genitourinary: Positive for flank pain. Negative for dysuria, urgency, frequency and difficulty urinating.  Musculoskeletal: Negative for back pain.  Psychiatric/Behavioral: Negative for dysphoric mood.       Objective:   Physical Exam  Constitutional: She appears well-developed and well-nourished. No distress.  HENT:  Head: Normocephalic and atraumatic.  Eyes: No scleral icterus.  Neck: Neck supple. No JVD present.  Cardiovascular: Normal rate and regular rhythm.   Pulmonary/Chest: Effort normal and breath sounds normal.  Abdominal: Soft. Bowel sounds are normal. She exhibits distension (mild distension). She exhibits no mass. There is tenderness (R CVAT, epigastric and RUQ TTP ). There is guarding. There is no rebound.  Musculoskeletal: She exhibits no edema.  Lymphadenopathy:    She has no cervical adenopathy.  Skin: Skin is warm and dry. No pallor.  Psychiatric: She has a normal mood and affect.          Assessment & Plan:

## 2011-03-19 ENCOUNTER — Ambulatory Visit: Payer: Self-pay | Admitting: Family Medicine

## 2011-03-19 LAB — CBC WITH DIFFERENTIAL/PLATELET
Eosinophils Relative: 3 % (ref 0–5)
HCT: 46.1 % — ABNORMAL HIGH (ref 36.0–46.0)
Hemoglobin: 15.5 g/dL — ABNORMAL HIGH (ref 12.0–15.0)
Lymphocytes Relative: 18 % (ref 12–46)
Lymphs Abs: 1.6 10*3/uL (ref 0.7–4.0)
MCV: 88.7 fL (ref 78.0–100.0)
Monocytes Absolute: 1 10*3/uL (ref 0.1–1.0)
Monocytes Relative: 11 % (ref 3–12)
Neutro Abs: 5.9 10*3/uL (ref 1.7–7.7)
RBC: 5.2 MIL/uL — ABNORMAL HIGH (ref 3.87–5.11)
RDW: 13.2 % (ref 11.5–15.5)
WBC: 8.8 10*3/uL (ref 4.0–10.5)

## 2011-03-19 LAB — COMPLETE METABOLIC PANEL WITH GFR
Alkaline Phosphatase: 74 U/L (ref 39–117)
BUN: 12 mg/dL (ref 6–23)
Creat: 0.71 mg/dL (ref 0.40–1.20)
GFR, Est African American: >60 mL/min (ref >60)
GFR, Est Non African American: >60 mL/min (ref >60)
Glucose, Bld: 98 mg/dL (ref 70–99)
Sodium: 132 mEq/L — ABNORMAL LOW (ref 135–145)
Total Bilirubin: 0.6 mg/dL (ref 0.3–1.2)
Total Protein: 6.7 g/dL (ref 6.0–8.3)

## 2011-03-19 LAB — IRON AND TIBC
%SAT: 18 % — ABNORMAL LOW (ref 20–55)
Iron: 54 ug/dL (ref 42–145)
TIBC: 296 ug/dL (ref 250–470)
UIBC: 242 ug/dL

## 2011-03-19 LAB — LIPASE: Lipase: 30 U/L (ref 0–75)

## 2011-03-19 LAB — AMYLASE: Amylase: 64 U/L (ref 0–105)

## 2011-03-20 ENCOUNTER — Telehealth: Payer: Self-pay | Admitting: Family Medicine

## 2011-03-20 DIAGNOSIS — R1013 Epigastric pain: Secondary | ICD-10-CM

## 2011-03-20 NOTE — Telephone Encounter (Signed)
Per Corrie Dandy at [facility], pt can come now to start drinking prep.  No pre-cert needed.  I have attempted to contact this patient by phone with the following results: left message to return my call on answering machine.

## 2011-03-20 NOTE — Telephone Encounter (Signed)
Pt can not come today, but can come on Monday.  Pt will come to [facility] today to pick up contrast and Corrie Dandy at their office will schedule her CT at that time.

## 2011-03-20 NOTE — Telephone Encounter (Signed)
Please advise 

## 2011-03-20 NOTE — Telephone Encounter (Signed)
Pls let pt know that her bloodwork came back normal other than a very high ferritin level.  We do need to proceed with a CT of the abdomen and pelvis.  I will go ahead and put the order in.  Judeth Cornfield, please try to schedule her for today or tomorrow.  Thanks.

## 2011-03-20 NOTE — Telephone Encounter (Signed)
Patient would like results from blood work

## 2011-03-23 ENCOUNTER — Ambulatory Visit
Admission: RE | Admit: 2011-03-23 | Discharge: 2011-03-23 | Disposition: A | Discharge status: Home / Self Care | Payer: Medicare Other | Source: Ambulatory Visit | Attending: Family Medicine | Admitting: Family Medicine

## 2011-03-23 ENCOUNTER — Telehealth: Payer: Self-pay | Admitting: Family Medicine

## 2011-03-23 MED ORDER — IOHEXOL 300 MG/ML  SOLN
100.0000 mL | Freq: Once | INTRAMUSCULAR | Status: AC | PRN
  Administered 2011-03-23: 100 mL via INTRAVENOUS

## 2011-03-23 NOTE — Telephone Encounter (Signed)
Pt aware of the above  

## 2011-03-23 NOTE — Telephone Encounter (Signed)
Pls fax recent OV note, labs and CT scan to Dr Mathis Bud (heme/ onc). Pls let pt know that her CT scan shows some chronic changes (fatty liver, back arthritis, diverticulosis, hiatal hernia) but nothing acute other than constipation.  This could definitely be the cause of her abdominal pain.  Remind her to stay on Miralax 1-2 x everyday for constipation and let her know that I am sending her results to Dr Abbe Amsterdam.  Let me know if not feeling any better.

## 2011-04-02 ENCOUNTER — Other Ambulatory Visit: Payer: Self-pay | Admitting: Cardiology

## 2011-04-14 NOTE — Assessment & Plan Note (Signed)
OFFICE VISIT   MANASVI, DICKARD A  DOB:  18-May-1935                                        February 18, 2009  CHART #:  [id number]   HISTORY OF PRESENT ILLNESS:  The patient presents to the office as an  unscheduled visit due to increased generalized aches and right-sided  chest pain.  She underwent median sternotomy for aortic valve  replacement, septal myomectomy, and right middle lobectomy on August 28, 2008, for severe aortic stenosis with subvalvular left ventricular  outflow tract obstruction and T2 N0 stage Ib moderately differentiated  squamous cell carcinoma of the lung.  She was last seen here in the  office, December 24, 2008.  She still had some soreness in her chest at  that time, but otherwise she was getting along fairly well.  She states  that the soreness in her chest never completely went away, and over the  last 2-3 weeks, she has had increased generalized soreness as well as  particular soreness radiating around her right costal margin in the  inferior aspect of her sternum.  She has not had any fevers or chills.  She has not had any shortness of breath and in fact she states her  breathing is much better than it ever was prior to surgery.  She has not  had a cough.  She has no pleuritic component to the pain and in fact,  she states that the pain is quite constant and does not seem to wax or  wane much in severity.  It had been worse over the last 2-3 weeks for  unclear reasons.  She does not recall straining herself, falling, or any  other particular physical activity that might have contributed to this.  She was recently started on Crestor for hyperlipidemia.  She has no  difficulty swallowing.  Her appetite is normal.  Her bowel function is  normal.  She has no abdominal complaints.  The remainder of her review  of system is unremarkable.  The remainder of her past medical history is  unchanged.   CURRENT MEDICATIONS:  1.  Enteric-coated aspirin 81 mg daily.  2. Metoprolol 50 mg twice daily.  3. Lisinopril 20 mg daily.  4. Crestor 10 mg daily.   PHYSICAL EXAMINATION:  GENERAL:  Notable for a well-appearing female.  VITAL SIGNS:  Blood pressure 144/65, pulse 75, and oxygen saturation 94%  on room air  CHEST:  A well-healed median sternotomy scar.  However, on palpation of  her sternum and lower chest wall, her symptoms of soreness are  immediately reproduced.  There is no sensation of any clicking or motion  of the sternum and in fact the sternum appears to have healed nicely.  However, her soreness and pain including the pain radiating around the  right side is reproduced on palpation.  Breath sounds are clear to  auscultation and symmetrical bilaterally.  No wheezes or rhonchi are  noted.  CARDIOVASCULAR:  Notable for regular rate and rhythm.  No murmurs, rubs,  or gallops are appreciated.  ABDOMEN:  Soft, nondistended, nontender.  Bowel sounds are present.  EXTREMITIES:  Warm and well perfused.  There is no lower extremity  edema.  The remainder of her physical exam is noncontributory.   DIAGNOSTIC TEST:  Chest x-ray obtained today at the [facility]  is reviewed.  This demonstrates stable radiographic appearance of  her chest with mild atelectasis and opacity in the vicinity of her  recent right middle lobectomy.  The lung fields are otherwise clear with  exception of mild stable opacity at the right upper lung field.  This is  unchanged from previously.  There are no pleural effusions.  There are  no pulmonary parenchymal lesions.  All the sternal wires appear intact.  No other abnormalities are noted.   IMPRESSION:  Atypical chest pain that has been accelerated over the last  few weeks that clearly seems to be a musculoskeletal in origin and  likely related to the patient's surgery last fall.  The patient does not  recall any particular physical activity that might have exacerbated  the  soreness.  She was recently started on Crestor and she feels achy all  over.  Her chest x-ray looks good and there are no other concerning  signs or symptoms.  I suspect that her soreness still relates to her  previous surgery and for some reason has been increased recently.   PLAN:  I have suggested that the patient contact Dr. Ludwig Clarks office  for followup related to her recent treatment with Crestor.  She  apparently has been intolerant of statins in the past.  I have suggested  that she start taking Naprosyn 2 tablets twice daily for 2 weeks to see  if this alleviate some of her soreness.  I have reminded her to avoid  any heavy lifting or strenuous use of her arms or shoulders for the time  being.  We have also given her prescription for Ultram tablets to use  for particularly severe episodes of pain.  We will plan to see her back  in 8 weeks and get a chest CT scan with IV contrast at that time for  followup.   Salvatore Decent. Cornelius Moras, M.D.  Electronically Signed   CHO/MEDQ  D:  02/18/2009  T:  02/19/2009  Job:  [id number]   cc:   Madolyn Frieze. Jens Som, MD, [facility]

## 2011-04-14 NOTE — Discharge Summary (Signed)
NAMEMYIAH, PETKUS NO.:  [id number]   MEDICAL RECORD NO.:  [id number]          PATIENT TYPE:  INP   LOCATION:  2018                         FACILITY:  [facility]   PHYSICIAN:  Salvatore Decent. Cornelius Moras, M.D. DATE OF BIRTH:  [birth year]   DATE OF ADMISSION:  08/28/2008  DATE OF DISCHARGE:  09/03/2008                               DISCHARGE SUMMARY   ADMITTING DIAGNOSES:  1. Severe aortic stenosis.  2. Right lung mass.  3. History of iron-deficiency anemia.  4. Longstanding tobacco abuse.   DISCHARGE DIAGNOSES:  1. Severe aortic stenosis.  2. Squamous cell carcinoma (moderately differentiated at the right      middle lobe).  3. Postoperative atrial fibrillation (with conversion to normal sinus      rhythm).  4. Postoperative nonsustained paroxysmal supraventricular tachycardia      (asymptomatic).  5. History of iron-deficiency anemia.  6. History of tobacco use.   OPERATIVE PROCEDURE:  Aortic valve replacement (21-mm [facility] Ease  pericardial tissue valve, septomyomectomy and right middle lobectomy by  Dr. Cornelius Moras on August 28, 2008.   PATHOLOGY:  Right middle lobe revealed squamous cell carcinoma (moderate  differentiated).  Surgical resection margins are negative for carcinoma.  No evidence of malignancy in the atrial ventricular septum or the aortic  valve leaflets.   HISTORY OF PRESENTING ILLNESS:  This is a 76 year old Caucasian female  with a history of iron-deficiency anemia and chronic tobacco abuse who  was found to have a heart murmur on physical exam.  She underwent an  echocardiogram that showed the presence of severe aortic stenosis.  She  was eventually seen by Dr. Jens Som at [facility] and he  performed a cardiac catheterization in early September 2009.  This again  confirmed the presence of severe AS with no significant coronary artery  disease.  The patient was originally seen in the office by Dr. Cornelius Moras on  August 07, 2008 and at  that time, tentative plans made to undergo an  elective aortic valve replacement.  During  routine chest x-ray,  however, she was found to have a mass in the right lung.  Chest CT  confirmed the presence of a spiculated mass in the right middle lobe  suspicious for primary lung cancer.  MRI of the brain was done on  August 15, 2008 and showed no signs of metastatic disease.  It should  be noted there was a 7-mm nonenhancing lesion in the right temporal lobe  that was thought likely representing a choroidal fissure cyst or  hematoma (this was not felt to likely represent metastasis).  The  patient also underwent a PET scan on August 17, 2008 which showed  very hypermetabolic right middle lobe mass compatible with malignancy.  Small peripheral density was also seen in the right middle lobe that had  very faintly increased FDG activity ( that could represent a  postobstructive pneumonitis).  There was no sign of mediastinal  lymphadenopathy.  A left adrenal mass was also seen but did not have  increased FDG activity most likely represents an adenoma.  The patient  was then admitted to [facility] to undergo the aforementioned right  middle lobectomy and aortic valve replacement by Dr. Cornelius Moras on August 28, 2008.   HOSPITAL COURSE STAY:  The patient was weaned off drips as tolerated.  She was extubated several hours after surgery.  She was anemic  postoperatively.  Her H&H was monitored closely.  She did not require  transfusion postoperatively.  She went to atrial fibrillation with  increased ventricular rate.  In the afternoon of August 30, 2008, she  was placed on an amiodarone drip.  She already had been placed on  Lopressor, this dose was increased.  She was volume overloaded and  diuresed accordingly.  She continued on the amiodarone drip.  She  converted to normal sinus rhythm.  The drip was stopped and she was  placed on p.o. amiodarone.  She was transferred from the ICU to  2000 for  further convalescence.  She did have one remaining chest tube.  This was  removed.  Followup chest x-ray on September 02, 2008, revealed a slight  increase in the small right apical pneumothorax.  The patient was also  found to be hypokalemic.  Potassium was supplemented accordingly.  The  patient did have brief AFib and then some nonsustained runs of PSVT  (asymptomatic).  She continued to progress with cardiac rehab.  She  continued to improve such that by postop day #6, she complained of some  soreness, she was afebrile.  Her vital signs were stable.  The  preoperative weight was 79 kg.  Her weight today was 84.8 kg.  Cardiovascular; regular rate and rhythm.  Lungs:  Few coarse breath  sounds in the bases, rare wheezing.  Extremities:  Trace lower extremity  edema.  Sternal incision was clean and dry but with some serous drainage  from the right chest tube site but no erythema.  Pacing wires were  removed.  The patient was watched until late the afternoon and was then  discharged.   LAST LABORATORY STUDIES:  Last CBC done September 02, 2008, showed the H&H  to be 9.6 and 28.1 respectively. platelet count 251,000.  Last BMET also  done on this date; potassium 3.6, she had already been supplemented.  Sodium of 129, BUN and creatinine 8 and 0.67 respectively.  UA done on  September 02, 2008, was essentially negative except for trace leukocytes.  Microscopic urinalysis showed 0-2 white blood cells.  Urine culture then  showed greater than 100,000 colonies of multiple bacterial morphotypes  present.  Last chest x-ray done September 03, 2008, showed small right  apical pneumothorax, left lung clear and small bilateral pleural  effusions.   DISCHARGE INSTRUCTIONS:  Include the following:  1. The patient to continue with breathing exercises daily.  2. She is to walk everyday and increase frequency duration as      tolerated.  3. She is not to drive or lift more than 10 pounds.  4. She is to  remain on a low-fat, low-salt diet.  5. She was instructed that she may shower and cleanse wound with mild      soap and water.  She is to call the office if any wound problems      arise.  It should be noted that home health was arranged.   Followup appointments include contacting Dr. Ludwig Clarks office for  followup appointment in 2 weeks.  [facility] office will contact the patient  regarding appointment for the right chest tube suture removal as well as  an appointment  to see Dr. Cornelius Moras in 3 weeks.   DISCHARGE MEDICATIONS:  1. ECASA 325 p.o. daily.  2. Lopressor 50 mg p.o. two times daily.  3. Zocor 20 mg p.o. at bedtime.  4. Amiodarone 400 mg p.o. two times daily x5 days, then 200 mg p.o.      two times daily thereafter.  5. Lasix 40 mg p.o. daily.  6. KCl 20 mEq p.o. daily x7 days.  7. Ultram 1-2 tablets every 4-6 hours as needed for pain.      Doree Fudge, PA      Cementon H. Cornelius Moras, M.D.  Electronically Signed    DZ/MEDQ  D:  09/04/2008  T:  09/05/2008  Job:  [id number]   cc:   Madolyn Frieze. Jens Som, MD, [facility]

## 2011-04-14 NOTE — Assessment & Plan Note (Signed)
OFFICE VISIT   SHIMMEL, Iisha A  DOB:  Apr 19, 1935                                        August 18, 2010  CHART #:  [id number]   HISTORY:  The patient returns for followup now 2 years status post  aortic valve replacement, septal myomectomy, and right middle lobectomy  for severe aortic stenosis with subvalvular left ventricular outflow  tract obstruction and concomitant T2 N0 stage IB moderately  differentiated squamous cell carcinoma of the lung.  She was last seen  here in the office on February 17, 2010.  Since then, she has done well  overall, although she states that she was hospitalized at the end of May  this year at [facility] with an episode of  severe nausea, vomiting, dizziness, and instability of gait.  She states  that it took her nearly a month to get over and she had difficulty with  her ambulation and balance for a period of time, but this gradually  resolved.  At that time, she had some type of a brain scan performed  (she is not sure whether or not it was a CT scan or an MRI) and she was  told that she did not have a stroke.  She had a full evaluation while  she was in the hospital and since then she has recovered uneventfully.  She reports that otherwise she feels well.  Her appetite is good.  She  has not been gaining nor losing weight.  She still has atypical pains in  her right chest that date back to her median sternotomy.  She states  that she can tell when the weather is changing as the aches will get  worse when a low-pressure system moves through the area.  She otherwise  feels well.  She denies any productive cough.  She has not had  hemoptysis.  She denies exertional shortness of breath.  Her appetite is  stable.  The remainder of her review of systems is unremarkable.  The  remainder of her past medical history is unchanged.   MEDICATIONS:  Aspirin, metoprolol, and lisinopril.   PHYSICAL  EXAMINATION:  Notable for well-appearing female with blood  pressure 127/71, pulse 66, and oxygen saturation 96% on room air.  Examination of the chest reveals clear breath sounds.  No wheezes,  rales, or rhonchi are noted.  Cardiovascular exam includes regular rate  and rhythm.  No murmurs, rubs, or gallops are appreciated.  The abdomen  is soft and nontender.  The extremities are warm and well perfused.  There is no lower extremity edema.  There is no bony tenderness on  palpation of the spine, arms, or legs.  Neurologic:  Grossly nonfocal.   DIAGNOSTIC TESTS:  Chest x-ray performed today at the [facility] is reviewed.  This demonstrates clear lung fields bilaterally  with stable radiographic appearance of the lungs and mediastinum.  All  the sternal wires appear intact.  The cardiac silhouette is normal.  No  new findings were identified.   IMPRESSION:  The patient seems to be doing well, now 2 years out from  surgery.  She has no sign or symptom to suggest the possibility of  either locally recurrent or distant metastatic disease of her primary  lung cancer.   PLAN:  I would  recommend that she continues to have an annual routine  followup chest x-rays at the very least for long-term followup of her  lung cancer.  We will be happy to see her back in 1 year's time.  All of  her questions have been addressed.   Salvatore Decent. Cornelius Moras, M.D.  Electronically Signed   CHO/MEDQ  D:  08/18/2010  T:  08/19/2010  Job:  [id number]   cc:   Nani Gasser, M.D.  Madolyn Frieze Jens Som, MD, [facility]

## 2011-04-14 NOTE — Assessment & Plan Note (Signed)
[facility]                            CARDIOLOGY OFFICE NOTE   NAME:Laurie [name], Laurie [name]                  MRN:          [id number]  DATE:07/25/2008                            DOB:          1935/07/20    HISTORY OF PRESENT ILLNESS:  Laurie [name] is a very pleasant 76 year old  female who I am asked to evaluate for aortic stenosis.  The patient  states that she has had a murmur for years.  She was recently seen by  her hematologist (she has iron deficiency and occasionally received  transfusions) and she was referred to a cardiologist in [place] due to  her murmur.  She was found to have aortic stenosis by her report and an  aortic valve replacement was recommended.  She has also seen a Careers adviser  in [place].  She asked to be seen here for a second opinion.  Note, she  has dyspnea with more extreme activities, but not with routine  activities.  She denies any orthopnea, PND, pedal edema, palpitations,  presyncope, syncope or exertional chest pain.  She does have a burning  in her chest when she receives iron infusions.  Also of note, that she  has significant fatigue at times by her report particularly with  exertion.   MEDICATIONS:  Include, iron infusions as needed.   ALLERGIES:  She has an allergy to VIOXX, which causes rash.   SOCIAL HISTORY:  She has a long history of tobacco use, smoking less  than 1-pack of cigarettes per day since she was 22.  She does not  consume alcohol.   FAMILY HISTORY:  Negative for coronary artery disease.   PAST MEDICAL HISTORY:  There is no diabetes mellitus, hypertension, or  hyperlipidemia.  She does have a history of iron-deficiency anemia.  She  states that she has been evaluated for GI blood loss and this has been  identified as occurring in her what sounds to be small intestines.  I do  not have those records available.  She does have a history of anemia.  She has had prior appendectomy and tonsillectomy.   REVIEW OF SYSTEMS:  She denies any headaches, fevers, or chills.  There  is no productive cough or hemoptysis.  There is no dysphagia,  odynophagia, melena, or hematochezia.  There is no dysuria or hematuria.  No rash or seizure activity.  There is no orthopnea, PND, or pedal  edema.  Remaining systems are negative.   PHYSICAL EXAMINATION:  VITAL SIGNS:  Today shows a blood pressure of  140/80 and her pulse is 79.  She is well-developed and well-nourished in  no acute distress.  SKIN:  Warm and dry.  GENERAL:  She does not appeared to be depressed.  EXTREMITIES:  There is no peripheral clubbing.  BACK:  Normal.  HEENT:  Normal with normal eyelids.  NECK:  Supple with a normal upstroke bilaterally.  I cannot appreciate  thyromegaly.  CHEST:  Clear to auscultation, normal expansion.  CARDIOVASCULAR:  Reveals a regular rate and rhythm.  Normal S1.  I  cannot appreciate S2.  There is a 3/6 systolic murmur  that radiates to  the carotids.  There is an S4.  ABDOMEN:  Nontender, distended.  Positive bowel sounds.  No  hepatosplenomegaly, no mass appreciated.  There is no abdominal bruit.  She has 2+ femoral pulses bilaterally.  No bruits.  EXTREMITIES:  Show no edema.  I could palpate no cords.  She has 2+  dorsalis pedis pulses bilaterally.  NEUROLOGIC:  Grossly intact.   Her electrocardiogram shows a sinus rhythm at a rate of 79.  The axis is  normal.  There is left ventricle hypertrophy with repolarization  abnormalities.   DIAGNOSES:  1. Aortic stenosis - This appears to be severe on physical      examination.  I will obtain her echocardiogram for review from      [facility].  I think if it indeed confirms severe aortic      stenosis, then she will most likely require aortic valve      replacement.  I think certainly some of her fatigue could be coming      from her aortic stenosis.  I have also explained that certainly      anemia can contribute to fatigue as well and that  there is no      guarantee that her symptoms will improve with aortic valve      replacement, but certainly if the aortic stenosis contributing      there is that possibility.  I have also explained that, given her      young age and the severity of aortic stenosis based on description      and on examination that she will most likely require aortic valve      replacement in the future.  If we indeed confirm this with her      echocardiogram, then I will schedule her for right and left heart      catheterization.  We will then schedule her to see one of our      cardiothoracic surgeons for valve replacement.  I have explained      the risk of catheterization as well as the benefits, and she agrees      to proceed if indeed we take this course.  2. History of anemia - We will need to follow her CBC closely in the      future and she does have a hematologist to follow this.  3. Tobacco abuse - We discussed the importance of discontinuing this      for between 3-10 minutes.   I have arranged for her to be seen here again in 3 months.  Note, in the  meantime, we will look for echocardiogram and arrange for  catheterization and valve replacement as indicated.     Madolyn Frieze Jens Som, MD, [facility]  Electronically Signed    BSC/MedQ  DD: 07/25/2008  DT: 07/26/2008  Job #: [id number]

## 2011-04-14 NOTE — Assessment & Plan Note (Signed)
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  She was last seen here in the  office on September 09, 2009.  Since then, she has done well overall.  She  reports no new significant medical problems or issues.  She has been  suffering from seasonal allergies in the last few weeks due to tree  pollen.  Her exercise tolerance is stable.  She states that her anterior  chest still feels somewhat sensitive in relation to her previous median  sternotomy, but overall it does not bother her and she denies any  sensation of motion or clicking of the sternum.  She denies any  significant productive cough or history of hemoptysis.  Her appetite is  stable.  Her weight is stable.  Her exercise tolerance is stable.  She  denies any problems with visual disturbances or frequent headaches.  She  denies any new particularly significant aches or pains in her back,  ribs, arms, or legs.  Bowel function is regular.  The remainder of her  review of systems is unremarkable.   PAST MEDICAL HISTORY:  Unchanged.   CURRENT MEDICATIONS:  Enteric-coated aspirin, metoprolol, lisinopril,  and vitamin supplements.   PHYSICAL EXAMINATION:  Notable for well-appearing female with blood  pressure 126/74, pulse 68, and oxygen saturation 96% on room air.  HEENT  exam is unrevealing.  The neck is supple and there is no palpable  cervical nor supraclavicular lymphadenopathy.  Auscultation of the chest  reveals a few coarse breath  sounds, but overall breath sounds are  symmetrical.  Cardiovascular exam is notable for regular rate and  rhythm.  There is a grade 2-3/6 systolic murmur heard best along the  right upper sternal border.  No diastolic murmurs are noted.  The  abdomen is soft, nondistended, and nontender.  The extremities are warm  and well perfused.  There is no bony tenderness on palpation of the  thoracic rib cage, spine, arms, or legs.  Neurologic examination is  grossly nonfocal.   DIAGNOSTIC TESTS:  Chest CT scan performed earlier today at [facility] is reviewed.  This demonstrates stable postoperative  changes with volume loss on the right side consistent with previous  right middle lobectomy.  The very small lung nodule noted in the left  lower lobe is unchanged from CT performed in September 2009 and clearly  represents a benign-appearing nodule.  There is a probable left adrenal  adenoma that is stable.  No other abnormalities are noted.   IMPRESSION:  Stable course, now approximately 18 months following aortic  valve replacement, septal myomectomy, and right middle lobectomy for  T2N0 stage IB moderately differentiated squamous cell carcinoma of the  lung.  There is no sign or symptom of locally recurrent or distant  metastatic disease.  The patient continues to abstain from tobacco use.  Overall, she  is doing well.   PLAN:  We will see the patient back in 6 months with a followup chest x-  ray for routine surveillance.   Salvatore Decent. Cornelius Moras, M.D.  Electronically Signed   CHO/MEDQ  D:  02/17/2010  T:  02/17/2010  Job:  [id number]   cc:   Madolyn Frieze. Jens Som, MD, [facility]  Nani Gasser, M.D.

## 2011-04-14 NOTE — H&P (Signed)
NAMELAURIANN, MILILLO NO.:  [id number]   MEDICAL RECORD NO.:  [id number]          PATIENT TYPE:  INP   LOCATION:  2305                         FACILITY:  [facility]   PHYSICIAN:  Guadalupe Maple, M.D.  DATE OF BIRTH:  [birth year]   DATE OF ADMISSION:  08/28/2008  DATE OF DISCHARGE:                              HISTORY & PHYSICAL   PROCEDURE:  Intraoperative transesophageal echocardiography.   Ms. Sofiya Ezelle is a 76 year old white female with a history of  aortic stenosis and a right lung mass who is scheduled to undergo an  aortic valve replacement and resection of lung mass by Dr. Tressie Stalker.  Intraoperative transesophageal echocardiography was requested to  evaluate the aortic valve to determine the adequacy of replacement and  to assess for any other valvular pathology and to serve as a monitor for  intraoperative volume status.   The patient was brought to the operating room at [facility],  and general anesthesia was induced without difficulty.  The trachea was  intubated without difficulty.  The transesophageal echocardiography  probe was inserted into the esophagus without difficulty.   IMPRESSION:  Prebypass findings:  1. Aortic valve:  The aortic valve was heavily calcified and appeared      to be bicuspid.  The opening was severely restricted, and there was      no aortic insufficiency.  The aortic annulus measured 2.0 cm.  The      aortic root measured 3.1 cm.  The aorta at the site of tubular      ridge measured 2.5 cm, and the proximal ascending aorta measured      3.6 cm.  The continuous-wave Doppler interrogation of the left      ventricular outflow tract revealed a peak transaortic gradient of      4.7 meters per second which correlated with a maximum instantaneous      gradient of 89 mmHg and a mean gradient of 57 mmHg.  Aortic valve      area calculated by using the continuity equation was 0.86 cm2.  2. Mitral valve:  The mitral  leaflets appeared structurally normal.      There was no evidence of prolapse or fluttering of the leaflets.      There was trace mitral insufficiency.  There was no evidence of      systolic anterior motion of the mitral valve.  3. Left ventricle:  There was severe left ventricular hypertrophy.      Left ventricular wall thickness measured 1.75 cm and was      concentric.  There was a near cavity obliteration during systole      and ejection fraction was estimated at 75-80%.  There were no wall      motion abnormalities.  4. Right ventricle:  The right ventricular size appeared to be within      normal limits.  There was good contractility of the right      ventricular free wall and normal-appearing right ventricular      function.  5. Tricuspid valve:  The tricuspid valve appeared structurally normal  with trace tricuspid insufficiency.  6. Interatrial septum:  The interatrial septum was intact without      evidence of patent foramen ovale by color Doppler or bubble study.  7. Left atrium:  The left atrial cavity showed no evidence of thrombus      in the left atrium or left atrial appendage.  8. Ascending aorta:  The proximal ascending aorta showed thickening      consistent with mild-to-moderate atheromatous disease but no mobile      plaques noted.  9. Descending aorta:  The descending aorta showed moderate to severe      atheromatous disease and measured 2.5 cm in diameter.   POSTBYPASS FINDINGS:  1. Aortic valve:  There was a bioprosthetic valve noted in the aortic      position.  The leaflets appeared to open normally.  There was no      aortic insufficiency and no perivalvular insufficiency appreciated.      Mean gradient across the valve measured 10 mmHg by continuous wave      Doppler.  2. Mitral valve:  The mitral valve again appeared structurally normal      with trace to 1+ mitral insufficiency and no evidence of systolic      anterior motion of the mitral valve.   3. Left ventricle:  Again, there was severe left ventricular      hypertrophy with ejection fraction estimated at 70-75%.  4. Right ventricle:  The right ventricular size appeared to be within      normal limits with good contractility of the right ventricular free      wall.  5. Tricuspid valve:  The tricuspid valve showed trace tricuspid      insufficiency but otherwise, appeared normal.           ______________________________  Guadalupe Maple, M.D.     DCJ/MEDQ  D:  08/28/2008  T:  08/29/2008  Job:  [id number]

## 2011-04-14 NOTE — H&P (Signed)
HISTORY AND PHYSICAL EXAMINATION   August 20, 2008   Re:  Laurie [name], [place] A           DOB:  [birth year]   Date of planned hospital admission:  August 28, 2008.   PRESENTING CHIEF COMPLAINT:  Exertional shortness of breath and atypical  chest discomfort.   HISTORY OF PRESENT ILLNESS:  The patient is a 76 year old previously  healthy female from [place], with longstanding history of tobacco  abuse and a long history of heart murmur on physical exam.  She also has  chronic iron deficient anemia, and when she was recently seen by her  hematologist it was recommended that she undergo an echocardiogram.  This was performed in August demonstrating the presence of severe aortic  stenosis.  She was initially referred to a cardiac surgeon in [facility], but she sought a second opinion.  She was then evaluated by Dr.  Olga Millers at the [facility], and following this she  underwent cardiac catheterization in early September 2009.  This  confirmed the presence of severe aortic stenosis with no significant  coronary artery disease.  I had the pleasure of initially evaluating the  patient on August 07, 2008.  At that time, we made tentative plans to  proceed with elective aortic valve replacement.  As part of her routine  preoperative evaluation, she underwent a chest x-ray that demonstrated a  mass in the right lung.  Chest CT scan confirmed the presence of a  spiculated mass in the right middle lobe suspicious for primary lung  cancer.  She was last seen here in the office on August 15, 2008.  Since then she underwent MRI of the brain and PET CT scan.  MRI of the  brain was performed August 15, 2008, and findings were notable for no  sign of distant metastatic disease.  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  This was not felt to  likely  represent metastasis.  The patient also underwent a PET scan.  This  confirmed that the lesion in the right middle lobe was hypermetabolic,  most likely representing primary lung cancer.  The second smaller  peripheral lesion in the right lung was not similarly enhanced,  suggesting that this could represent some post-obstructive pneumonitis  or atelectasis.  There is no sign of mediastinal lymphadenopathy and  there is no sign of distant metastatic disease.  The patient returns to  the office today with both of her sons to discuss matters further and  talk about options.   Review of systems has been well documented previously including a  thorough review at the time of her initial consultation on August 07, 2008.  Her review of systems is unchanged, although she has certainly  had some degree of anxiety over the last couple of weeks.   Past medical history is unchanged.  Pulmonary function tests were  performed at [facility] on August 17, 2008.  These demonstrated mild obstructive defect with baseline FEV-1 measured  1.87 liters or 78% predicted.  Following bronchodilator therapy, this  increased to 2.11 liters or 88% predicted.  The FEF 25-75 was moderately  reduced at 45% predicted and this increased slightly to 1.05 liters or  52% predicted.  The forced vital capacity was 2.86 liters or 93%  predicted.  Diffusion capacity was only mildly reduced at  92% predicted.   Current medications remain unchanged and notable that the patient does  not take any medications on a regular basis.   DRUG ALLERGIES:  There are no drug allergies known.  Vioxx has caused a  rash in the past.   PHYSICAL EXAMINATION:  GENERAL:  A well-appearing female.  VITAL SIGNS:  Blood pressure 159/89, pulse is 88, oxygen saturation 96%  on room air.  HEENT:  Unrevealing.  NECK:  There is no palpable lymphadenopathy.  CHEST:  Auscultation of the chest reveals clear breath sounds  which are  symmetrical bilaterally.  No wheezes or rhonchi noted.  CARDIOVASCULAR:  Regular rate and rhythm.  There is a crescendo-  decrescendo systolic murmur heard best at the right upper sternal  border.  No diastolic murmurs are noted.  ABDOMEN:  Soft, nondistended, nontender.  There are no palpable masses.  Femoral pulses are strong and palpable in the groin bilaterally  EXTREMITIES:  Distal pulses are palpable at the ankle.  There is no  venous insufficiency.  RECTAL:  Deferred.  GU:  Deferred.  NEUROLOGIC:  Grossly nonfocal and symmetrical throughout.   IMPRESSION:  Severe aortic stenosis with normal left ventricular  systolic function and stable symptoms of exertional shortness of breath.  The patient also has a 3 cm spiculated mass in the right middle lobe  suspicious for primary lung cancer, clinical stage T1/T2, N0.  The  severity of aortic stenosis is such that I do not think it would be wise  to proceed with lung resection prior to treating her aortic stenosis  surgically.  Therefore, alternatives include proceeding with aortic  valve replacement through conventional approach followed by delayed  right thoracotomy for pulmonary resection subsequent to recovery from  her heart surgery.  Alternatively, we could proceed with aortic valve  replacement and right middle lobectomy at the same sitting.  This may be  fairly straight forward through a right anterior thoracotomy approach.  It is likely that a combined approach would come with slight increased  risk, but the combined risk may be less than doing two separate  operations at a separate sitting and it would completely avoid the delay  in the surgical management of her presumed lung cancer.   PLAN:  I have discussed matters at length with the patient and her two  sons here in the office today.  I have explained to them that the most  conservative approach would be to stage the two procedures separately.  The patient is  eager to get both procedures out of the way as soon as  practical, and under the circumstances I think a safe and reasonable  alternative is to do both at the same time.  They understand that there  may be some risks inherent in this approach and I have explained these  at length.  All their questions have been addressed.  We tentatively  planned to proceed with surgery on Tuesday, August 28, 2008.  We will  plan to replace her aortic valve using a bioprosthetic tissue valve as  we discussed previously.  We will plan primary right middle lobectomy  for treatment of right middle lobe lung mass, which presumably  represents primary lung cancer.  They understand and accept all  potential associated risks of surgery including but not limited to risk  of death, stroke, myocardial infarction, congestive heart failure,  respiratory failure, bleeding requiring blood transfusion, arrhythmia,  heart block requiring permanent pacemaker, complications related to  incision made in  the right groin for access of the right femoral artery  and vein, prolonged air leak requiring chest tube drainage, late  recurrence of presumed lung cancer, prolonged chest wall pain, possible  need to extend incision either as a large thoracotomy or across the  sternum as needed.  All their questions have been addressed.  Prior to  surgery we will obtain a CT angiogram of the thoracic and abdomenal  aorta to screen for significant plaque that might put her at risk for  stroke if we choose to use femoral artery cannulation for surgery.   Salvatore Decent. Cornelius Moras, M.D.  Electronically Signed   CHO/MEDQ  D:  08/20/2008  T:  08/21/2008  Job:  [id number]   cc:   Madolyn Frieze. Jens Som, MD, [facility]

## 2011-04-14 NOTE — Cardiovascular Report (Signed)
Laurie [name], Laurie [name] NO.:  [id number]   MEDICAL RECORD NO.:  [id number]          PATIENT TYPE:  OIB   LOCATION:  1962                         FACILITY:  [facility]   PHYSICIAN:  Arturo Morton. Riley Kill, MD, FACCDATE OF BIRTH:  09/05/1935   DATE OF PROCEDURE:  08/03/2008  DATE OF DISCHARGE:  08/03/2008                            CARDIAC CATHETERIZATION   INDICATIONS:  Ms. Mullinax has been treated for anemia.  She was noted  to have a murmur.  Echocardiography suggested significant aortic  stenosis.  She saw Dr. Jens Som as a second opinion and he confirmed the  diagnosis and she was set up for cardiac catheterization.  Risks,  benefits, and alternatives were discussed with the patient prior to the  procedure and she consented to proceed.   PROCEDURES:  1. Right heart catheterization.  2. Placement of catheters for coronary arteriography.  3. Selective coronary arteriography.  4. Aortic root aortography.   DESCRIPTION OF THE PROCEDURE:  The patient had a significant gradient on  echocardiography.  On plain fluoroscopy, there was evidence of heavy  calcification of the aortic valve without significant visible motion.  Right heart catheterization was then performed from the right femoral  vein on a first stick using a 7-French sheath and a 7.5-French  thermodilution Swan-Ganz catheter.  The right heart catheter was taken  to the superior vena cava where saturation was obtained.  Serial right  heart pressures were obtained.  Pulmonary artery saturation was then  obtained.  Thermodilution cardiac outputs were then performed.  Following this, the femoral artery was then entered.  A 4-French sheath  was placed.  Coronary arteriography was performed initially with a 4-  curved catheter, but subsequently with a 5-curved because of the  enlarged aortic root.  A no__________ catheter was used to inject the  right coronary artery, although was nonselective, visualization was  excellent.  Therefore, an alternative catheter was not chosen.  The  pigtail catheter was taken to the aortic root.  Given the echo data and  angiographic findings, we elected not to cross the aortic valve.  Aortography was then performed without complication.  Pressure was  completed, hemostasis was achieved by direct manual compression by the  staff in the holding area.   HEMODYNAMIC DATA:  1. Right atrial pressure 9.  2. RV 27/9.  3. Pulmonary artery 32/80, mean 24.  4. Pulmonary capillary wedge 17.  5. Aortic pressure 152/85, mean 111.  6. Superior vena cava saturation 63%.  7. Pulmonary artery saturation 64%.  8. Aortic saturation 93%.  9. Fick cardiac output 4.3 L per minute.  10.Fick cardiac index 2.2 L per minute per meter squared.  11.Thermodilution cardiac output 4.2 L per minute.  12.Thermodilution cardiac index 2.2 L per minute per meter squared.   ANGIOGRAPHIC DATA:  1. On plain fluoroscopy, there is heavy calcification of the aortic      valve with poor motion.  Fluoroscopy also demonstrates modest      calcification of the coronary vessels.  2. The left main is a large-caliber vessel free of critical disease.  3. The LAD courses to the apex  and provides one major diagonal branch.      There was rapid washout of the contrast, but on multiple views and      careful analysis, there appeared to be just very mild plaquing in      the midportion of the distal LAD.  It did not appear to be in      excess of 50%.  The diagonal also appeared to be free of critical      disease.  4. The circumflex consisted predominantly of a tiny first marginal      within 2 large marginals.  Other than minimal luminal irregularity,      the vessels were fairly large in caliber, compatible with the      patient's left ventricular hypertrophy, and without significant      critical obstruction.  5. The right coronary artery is a modest size vessel providing a      posterior descending and  posterolateral branch.  The right coronary      artery appears to be without significant focal obstruction.  There      is mild luminal irregularity of the PDA.   Proximal root aortography reveals no critical aortic regurgitation.  The  aortic valve is very poorly mobile, and lack of motion is evident.  The  aortic root is modestly dilated above the aortic valve compatible with a  need for 5-curved catheters.   CONCLUSION:  1. Severe aortic stenosis.  2. Minor coronary plaquing without significant obstruction.  3. No significant aortic regurgitation with mild dilatation of the      aortic root.   DISPOSITION:  The patient is scheduled to see Dr. Barry Dienes on Tuesday.  I  have notified Dr. Jens Som, and I have placed a call to Dr. Barry Dienes, who  was currently in the operating room.  The patient is stable for  discharge.      Arturo Morton. Riley Kill, MD, [facility]  Electronically Signed     TDS/MEDQ  D:  08/03/2008  T:  08/04/2008  Job:  [id number]   cc:   Arturo Morton. Riley Kill, MD, [facility]  Madolyn Frieze. Jens Som, MD, [facility]  CV Laboratory  Salvatore Decent. Cornelius Moras, M.D.

## 2011-04-14 NOTE — Assessment & Plan Note (Signed)
OFFICE VISIT   Laurie [name], [name]  DOB:  31-May-1935                                        August 09, 2008  CHART #:  [id number]   This office note is to document telephone conversation I had with the  patient this afternoon.   The patient contacted our office yesterday and is eager to proceed with  surgery.  After discussing matters with her family, she hopes to proceed  as soon as practical.  We tentatively plan for surgery on Wednesday,  August 15, 2008, using partial upper mini-sternotomy for aortic valve  replacement.  We will replace her valve using a bioprosthetic tissue  valve.  The patient and her family understand and accept all potential  associated risks of surgery and desired to proceed as described.  They  understand all potential treatment and alternatives and all of their  questions have been addressed.   Salvatore Decent. Cornelius Moras, M.D.  Electronically Signed   CHO/MEDQ  D:  08/09/2008  T:  08/10/2008  Job:  [id number]

## 2011-04-14 NOTE — Op Note (Signed)
Laurie [name], AMENTA NO.:  [id number]   MEDICAL RECORD NO.:  [id number]          PATIENT TYPE:  INP   LOCATION:  2305                         FACILITY:  [facility]   PHYSICIAN:  Salvatore Decent. Cornelius Moras, M.D. DATE OF BIRTH:  1935-04-09   DATE OF PROCEDURE:  08/28/2008  DATE OF DISCHARGE:                               OPERATIVE REPORT   PREOPERATIVE DIAGNOSES:  1. Severe aortic stenosis.  2. Right lung mass.   POSTOPERATIVE DIAGNOSES:  1. Severe aortic stenosis.  2. Non-small cell carcinoma of the lung.   PROCEDURE:  Median sternotomy for aortic valve replacement (21-mm  [facility] Ease pericardial tissue valve), septal myomectomy, right  middle lobectomy.   SURGEON:  Salvatore Decent. Cornelius Moras, MD   ASSISTANT:  Sheliah Plane, MD   SECOND ASSISTANT:  Doree Fudge, PA   ANESTHESIA:  General.   BRIEF CLINICAL NOTE:  The patient is a 76 year old previously healthy  female with longstanding history of tobacco abuse.  The patient was  found to have heart murmur on physical exam and described symptoms of  progressive symptoms and exertional shortness of breath.  Echocardiogram  confirms severe aortic stenosis with normal left ventricular systolic  function.  The patient subsequently underwent elective left and right  heart catheterization.  This revealed no significant coronary artery  disease.  Plans were made initially for elective aortic valve  replacement, but routine preoperative chest x-ray revealed a mass in the  right lung.  Chest CT scan and PET scan confirmed the mass to be  hypermetabolic and suspicious for primary lung cancer.  There is no sign  of mediastinal or hilar lymphadenopathy.  There is no sign of distant  metastatic disease.  Pulmonary function test demonstrated only mild  obstructive lung disease.  A full consultation note and several  additional office visit notes have been dictated previously.  The  patient now presents for elective surgical  intervention for definitive  management of both problems.  The patient and her family have been  counseled at length regarding the indications, risks, and potential  benefits of surgery.  Alternative treatment strategies have been  reviewed in detail.  All of their questions have been addressed.   OPERATIVE FINDINGS:  1. Severe aortic stenosis.  2. Severe left ventricular hypertrophy with significant narrowing of      the left ventricular outflow tract.  3. 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   OPERATIVE NOTE IN DETAIL:  The patient is brought to the operating room  on the above-mentioned date and central monitoring is established by the  anesthesia service under the care and direction of Dr. Kipp Brood.  Specifically, a Swan-Ganz catheter is placed through the right internal  jugular approach.  A radial arterial line is placed.  Intravenous  antibiotics are administered.  Following induction with general  endotracheal anesthesia, a Foley catheter is placed.  The patient  undergoes baseline transesophageal echocardiogram by Dr. Noreene Larsson.  This  confirms the presence of severe aortic stenosis.  There is severe left  ventricular hypertrophy which is  concentric.  There is normal left  ventricular systolic function.   The patient is positioned in the supine position on the operating table  with a roll behind the right scapula.  The patient's chest, abdomen,  both groins, and both lower extremities are prepared and draped in  sterile manner.  A median sternotomy incision is performed.  Single lung  ventilation is begun.  The right pleural space is opened.  The right  lung is examined.  There are no pleural effusions.  There is an obvious  palpable, firm mass within the right middle lobe.  There are no other  palpable abnormalities appreciated.  There is no sign of hilar or  mediastinal lymphadenopathy.   The reflection  of the pleura around the right hilum is incised  anteriorly.  The right superior pulmonary vein is identified, and the  branches of the vein to the right middle lobe are separately encircled  and subsequently divided using an ATW stapler.  A 10-mm port is placed  laterally and a videothoracoscopic camera is passed through the port.  The major fissure is inspected and appears to be well developed.  The  minor fissure is not well developed at all.  Several fires of the  Echelon endoscopic GIA stapling device are then utilized to divide the  right upper lobe from the right middle lobe along the vicinity of the  minor fissure.  Care is taken to avoid injury to the pulmonary veins  draining the right upper lobe.  After this is completed, the remainder  of the right middle lobectomy is completed using several fires of an  endoscopic stapler with green load staple size to come across the base  of the right middle lobe to include the right middle lobe bronchus and  the pulmonary artery branches and completion of the major fissure.  The  right middle lobe is removed and sent to pathology.   Preliminary frozen section analysis of the removed specimen is notable  for primary tumor consistent with probable squamous cell carcinoma.  All  staple surgical margins are free of tumor.   The right chest is irrigated with copious warm saline solution and the  right lung is ventilated.  There is no sign of any air leak.   The pericardium is opened.  The ascending aorta is mildly dilated and  fragile, but otherwise free of significant atherosclerotic disease.  The  patient is heparinized systemically.  Ascending aorta is cannulated for  cardiopulmonary bypass.  Venous cannula is placed through the right  atrium and retrograde cardioplegic catheter is placed through the right  atrium into the coronary sinus.  Cardiopulmonary bypass is begun and the  left ventricular vent is placed through the right superior  pulmonary  vein and an antegrade cardioplegic catheter is placed in the ascending  aorta and a temperature probe is placed in the left ventricular septum.   The patient is allowed to cool passively to 32 degrees systemic  temperature.  The aortic cross-clamp is applied and cold blood  cardioplegia is administered initially in an antegrade fashion through  the aortic root.  Iced saline slush is applied for topical hypothermia  and supplemental cardioplegia is administered retrograde through the  coronary sinus catheter.  The initial cardioplegic arrest and myocardial  cooling is felt to be excellent.  Repeat doses of cardioplegia are  administered retrograde through the coronary sinus catheter  intermittently throughout the entire cross-clamp portion of the  operation to maintain left ventricular septal  temperature below 15  degrees centigrade and completely isoelectric electrocardiogram.   A transverse aortotomy incision is performed.  The aortic valve is  inspected.  The aortic valve is bicuspid with fusion of the raphe  between the left and right cusps of the valve.  There is severe aortic  stenosis with heavy calcification in all leaflets of the valve.  The  coronary arteries are in the normal anatomical location.  The aortic  valve is excised sharply.  The aortic annulus is decalcified.  This is  somewhat tedious, but technically straightforward.  After complete  decalcification of the annulus, one can appreciate significant narrowing  of the left ventricular outflow tract due to severe left ventricular  hypertrophy involving the interventricular septum.   An 11-blade knife is utilized to create a 1-cm trough in the  interventricular septum.  A vertical incision is made just to the right  of midportion between the right sinus of Valsalva and then extending in  a centimeter direction laterally towards the commissure between the left  and right sinuses of Valsalva.  Specimen of  interventricular septum is  removed by connecting the two longitudinal incisions creating a trough  down deep into the left ventricular chamber.   The aortic annulus is sized to accept a 21-mm stented bioprosthetic  tissue valve.  Aortic valve replacement is performed using interrupted 2-  0 Ethibond horizontal mattress pledgeted sutures with pledgets in the  subannular position.  An [facility] Ease pericardial tissue valve  (model number 3300TFX, serial number [id number]) is secured in place  uneventfully.  Rewarming is begun.   The aortotomy is closed using a two-layer closure of running 4-0 Prolene  suture across Teflon felt strips to buttress the repair.  The patient is  placed in Trendelenburg position.  One final dose of warm retrograde hot  shot cardioplegia is administered.  The lungs are ventilated and the  heart allowed to fill to evacuate any residual air through the aortic  root.  The aortic cross-clamp is removed after a total cross-clamp time  of 77 minutes.   The heart is defibrillated and ultimately normal sinus rhythm resumes.  The patient is rewarmed to 37 degrees centigrade temperature.  Epicardial pacing wires are fixed to the right ventricular outflow tract  into the right atrial appendage.  The left ventricular vent is removed.  The patient is weaned from cardiopulmonary bypass without difficulty.  The patient's rhythm at separation from bypass is normal sinus rhythm.  Atrial pacing is employed to increase the heart rate.  No inotropic  support is required.  Total cardiopulmonary bypass time for the  operation is 100 minutes.  Followup transesophageal echocardiogram  performed by Dr. Noreene Larsson after separation from bypass demonstrates normal  left ventricular systolic function.  There is a well-seated  bioprosthetic tissue valve in the aortic position.  There is no  perivalvular leak.  The valve is functioning normally.  There is no  residual air.   The venous and  arterial cannulae are removed uneventfully.  Protamine is  administered to reverse the anticoagulation.  The mediastinum and right  pleural space are irrigated with saline solution containing vancomycin.  Meticulous surgical hemostasis is ascertained.  The mediastinum is  drained using 2 chest tubes exited through separate stab incisions  inferiorly.  The right pleural space is drained using 2 separate chest  tubes exited directly through the right chest wall.  The right lung is  transiently collapsed and again inspected for hemostasis along  the  staple lines.  The right lung is then ventilated and again there is no  sign of any air leak.  The pericardium is reapproximated loosely,  anteriorly.   The median sternotomy is closed with double-strength sternal wire.  The  soft tissues anterior to the sternum are closed in multiple layers and  the skin is closed with running subcuticular skin closure.  The patient  tolerated the procedure well and is transported to the surgical  intensive care unit in stable condition following reintubating the  patient in the operating room using a single-lumen endotracheal tube.  There are no intraoperative complications.  All sponge, instrument, and  needle counts are verified correct at completion.  There are no blood  products are administered.     Salvatore Decent. Cornelius Moras, M.D.  Electronically Signed    CHO/MEDQ  D:  08/28/2008  T:  08/29/2008  Job:  [id number]   cc:   Madolyn Frieze. Jens Som, MD, [facility]

## 2011-04-14 NOTE — Assessment & Plan Note (Signed)
[facility]                            CARDIOLOGY OFFICE NOTE   ADIA, CRAMMER                  MRN:          [id number]  DATE:10/31/2008                            DOB:          12-26-1934    Ms. Laurie [name] returns for followup today.  She has a history of aortic  stenosis and a recent aortic valve replacement.  Preoperative evaluation  revealed a lung mass with no metastatic disease noted.  On August 28, 2008, she underwent aortic valve replacement with a pericardial tissue  valve, septal myectomy and right middle lobectomy successfully.  She did  have a 50% mid LAD preoperatively and a 40-60% internal carotid artery  stenosis on the left.  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  She has done reasonably well  postoperatively.  She was admitted to [facility] on September 22, 2008,  secondary to palpitations, sweats, and shingles.  At that time, she did  attribute this predominantly to a flu shot.  Note, she had blood  cultures because her white blood cell count was elevated, but these were  negative.  She had an echocardiogram that showed normal LV function.  The aortic valve is not well visualized.  Also noted, TSH was normal.  Since I saw her previously, she feels reasonably well.  She still has a  significant amount of soreness in her chest.  However, her dyspnea has  markedly improved.  There is no orthopnea or PND.  There is no pedal  edema.  She has not had, otherwise, exertional chest pain and there is  no syncope.  She is complaining of myalgias and feel like she has a  flu most of the time.   MEDICATIONS:  1. Aspirin 81 mg p.o. daily.  2. Lopressor 50 mg p.o. b.i.d.  3. Zocor 20 mg p.o. nightly.   PHYSICAL EXAMINATION:  VITAL SIGNS:  Today, shows a blood pressure of  160/87 and her pulse is 77.  She weighs 172 pounds.  HEENT:  Normal.  NECK:  Supple.  CHEST:  Clear.  CARDIOVASCULAR:   Regular rhythm.  There is a 2/6 systolic murmur at the  left sternal border.  There is no diastolic murmur noted.  ABDOMEN:  No tenderness.  EXTREMITIES:  No edema.   DIAGNOSES:  1. Status post aortic valve replacement with a #21-Edwards Magna      pericardial tissue valve - She will continue with subacute      bacterial endocarditis prophylaxis and she will need to followup      echocardiograms in the future.  2. Status post lung cancer resection - This is being managed by Dr.      Cornelius Moras.  3. History of moderate coronary artery disease and cerebrovascular      disease - She will continue on aspirin.  I am discontinuing her      Zocor at this point.  She has had what I think may be myalgias.  If      these improved, then we will try a different statin in the future.  Note, she will need to follow up carotid Dopplers in October 2009.  4. 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  5. Tobacco abuse - Now resolved.  6. History of iron deficiency anemia - this is managed by her      hematologist.  7. Postoperative atrial fibrillation - She remains in sinus rhythm on      examination today.  8. Hypertension - We will add lisinopril 20 mg p.o. daily for her      elevated blood pressure.  She will check a BMET in 1 week to follow      potassium and renal function.  9. Hyperlipidemia - Again, we will discontinue her Zocor as she is      having possible myalgias.  We will consider adding another statin      in the future if her symptoms improved.  I will see her back in 3      months.     Madolyn Frieze Jens Som, MD, [facility]  Electronically Signed    BSC/MedQ  DD: 10/31/2008  DT: 11/01/2008  Job #: [id number]

## 2011-04-14 NOTE — Assessment & Plan Note (Signed)
OFFICE VISIT   Laurie [name], Laurie [name]  DOB:  October 12, 1935                                        August 27, 2008  CHART #:  [id number]   HISTORY OF PRESENT ILLNESS:  This note documents telephone conversation  I had with the patient today.  She underwent CT angiogram of her  thoracic and abdominal aorta on August 24, 2008.  There was  significant atheromatous changes in the abdominal aorta, although there  was no aortic aneurysm or sign of high-grade stenosis.  There was heavy  calcified plaque around the aortic bifurcation associated with the  origin and probable stenosis of the right common iliac artery.  There is  mild aneurysmal dilatation of the right common iliac artery more  distally.  Based upon these findings, I am concerned that the added risk  associated with femoral arterial cannulation for minimally invasive  approach for treatment of aortic valve is probably not worth the  potential benefit.  I discussed these findings over the telephone with  the patient today.  We will plan [name] median sternotomy for aortic valve  replacement and right middle lobectomy.  She understands that [name] small  utility thoracotomy incision may be necessary as well.  All of her  questions have been addressed.   Salvatore Decent. Cornelius Moras, M.D.  Electronically Signed   CHO/MEDQ  D:  08/27/2008  T:  08/28/2008  Job:  [id number]   cc:   Madolyn Frieze. Jens Som, MD, [facility]

## 2011-04-14 NOTE — Assessment & Plan Note (Signed)
OFFICE VISIT   Laurie [name], Laurie [name]  DOB:  04/04/1935                                        October 15, 2008  CHART #:  [id number]   The patient is status post aortic valve replacement using [name] 21-mm  [facility] Ease pericardial tissue valve as well as septal myomectomy  and right middle lobectomy done by Dr. Cornelius Moras on August 28, 2008.  The  patient's postoperative course was pretty much unremarkable.  She was  discharged to home in stable condition on September 03, 2008.  The patient  did require readmission to the hospital for palpitations by Dr.  Jens [name].  She stabilized and was discharged to home 2 days later.  The  patient presents today for [name] followup visit.  The patient is doing well.  She is up ambulating without difficulty.  She states her breathing has  much improved since surgery.  She still has some chest discomfort, which  she is using Tylenol x4.  She was seen by her primary care doctor and  received [name] flu shot and then developed shingles following the flu shot.  She was placed on famciclovir.  The patient states shingles started  improving.  She denies any nausea, vomiting, opening or drainage from  any of her incision sites.  She has seen Dr. Jens [name] and plans to  follow back up with him in December.  Cardiac rehabilitation has  contacted her, but she has not called them back.   PHYSICAL EXAMINATION:  VITAL SIGNS:  Blood pressure 160/83, pulse 74,  respirations of 18, and O2 sats 95% on room air.  Temperature of 98.1.  RESPIRATORY:  Clear to auscultation bilaterally.  CARDIAC:  Regular rate and rhythm.  No murmurs, gallops, or rubs are  noted.  EXTREMITIES:  No edema noted.  INCISIONS:  All incisions are clean, dry, intact, and healing well.   STUDIES:  The patient had PA and lateral chest x-ray done today, which  is stable.  Postsurgical changes noted with no pleural effusions or  atelectasis.   IMPRESSION AND PLAN:  The patient  is progressing well.  She was seen and  evaluated by Dr. Cornelius Moras.  Dr. Cornelius Moras also evaluated the patient's chest x-  ray.  The patient is encouraged to contact cardiac rehabilitation and  she started the program.  She is also to continue ambulating on her own.  The patient was instructed she is okay to drive, but still no heavy  lifting over 10 pounds for another 2 months.  She is to continue taking  her current medications.  She is to keep her followup appointment with  Dr. Jens [name] in December.  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  The patient is told in the  interim if she has any surgical issues, she is to contact us.   Salvatore Decent. Cornelius Moras, M.D.  Electronically Signed   KMD/MEDQ  D:  10/15/2008  T:  10/16/2008  Job:  [id number]   cc:   Salvatore Decent. Cornelius Moras, M.D.  Laurie Frieze Jens Som, MD, [facility]

## 2011-04-14 NOTE — Discharge Summary (Signed)
[name], [name]:  [id number]   MEDICAL RECORD NO.:  [id number]          PATIENT TYPE:  INP   LOCATION:  2004                         FACILITY:  [facility]   PHYSICIAN:  Jesse Sans. Wall, MD, FACCDATE OF BIRTH:  [birth year]   DATE OF ADMISSION:  09/22/2008  DATE OF DISCHARGE:  09/24/2008                               DISCHARGE SUMMARY   PRIMARY CARDIOLOGIST:  Madolyn Frieze. Jens Som, MD, [facility]   PRIMARY CARE Kissa Campoy:  Laurie Gasser, MD in [place].   SURGEON:  Salvatore Decent. Cornelius Moras, MD   DISCHARGE DIAGNOSIS:  Palpitations.   SECONDARY DIAGNOSES:  1. History of severe aortic stenosis status post aortic valve      replacement on August 28, 2008, with a 21-mm [facility] Ease      pericardial tissue valve, performed by Dr. Cornelius Moras.  2. History of nonsmall-cell carcinoma of the lung status post right      middle lobectomy, August 28, 2008.  3. Herpes zoster on famciclovir therapy.  4. History postoperative atrial fibrillation following aortic valve      replacement.  5. Peripheral vascular disease and carotid artery disease.  6. Iron-deficiency anemia.  7. Hypertension.  8. Remote tobacco abuse.  9. History of left adrenal adenoma.   ALLERGIES:  VIOXX.   PROCEDURES:  1. A 2D echocardiogram performed on September 22, 2008, showing an      ejection fraction of 60-65%.  The prosthetic aortic valve      replacement was not well visualized.   HISTORY OF PRESENT ILLNESS:  A 76 year old Caucasian female who was  recently status post aortic valve replacement with a bioprosthetic valve  on August 28, 2008, who did well postoperatively.  Approximately 2  days prior to admission, the patient had a flu shot and then  subsequently developed a rash on her neck on Friday, September 21, 2008.  She was seen by her primary care Raphaela Cannaday and was diagnosed with  shingles and initiated on famciclovir therapy.  On the day prior to  admission, she experienced palpitations  for several hours with heart  rates in the low 100s, and she had recurrent palpitations on the morning  of admission prompting her to present to [facility] ED.  In the ED,  heart rate was 107 in sinus rhythm.  ECG otherwise showed no acute  changes.  The patient was admitted for further evaluation.   HOSPITAL COURSE:  The patient exhibited no arrhythmias on the monitor.  She was maintained on her home medications.  She also reported  intermittent diaphoresis with her spells of palpitations and given her  recent surgery and an elevated white count of 14.4, blood cultures were  obtained and have shown no growth x4.  Urine culture was also normal.  She was initially treated with vancomycin and Zosyn, and a 2D  echocardiogram was performed showing normal LV function.  Aortic valve  was not adequately imaged.  As the patient provided no history of fever  or chills, we have discontinued her antibiotics, and I have decided not  to perform transesophageal echocardiogram to better evaluate the aortic  valve.  If however she would develop fevers or chills, she has been  advised to contact us so that we can arrange for a potential admission  and subsequent transesophageal echocardiogram.  In the meantime, we have  continued her antiviral agent for shingles, as well as her beta blocker,  aspirin, and statin therapy as previously prescribed.  Ms. Toves  feels well and very eager for discharge.  She is being discharged home  today in good condition.   DISCHARGE LABORATORY:  Hemoglobin 11.9, hematocrit 35.3, WBC 7.8, and  platelets 376.  Sodium 135, potassium 4.0, chloride 103, CO2 24, BUN 7,  creatinine 0.72, and glucose 111.  CK-MB 2.5 and troponin I less than  0.05.  TSH 2.272.  Blood and urine cultures were negative.   DISPOSITION:  The patient is being discharged home today in good  condition.   FOLLOWUP PLANS AND APPOINTMENTS:  She has an appointment scheduled to  see Dr. Cornelius Moras on October 01, 2008.  She will follow with Dr. Jens Som in  [place] on October 10, 2008, at 2:00 p.m. or sooner if necessary.  She was asked to follow up with primary care Leshae Mcclay, Dr. Linford Arnold, in  [place].   DISCHARGE MEDICATIONS:  1. Aspirin 81 mg daily.  2. Metoprolol tartrate 50 mg b.i.d.  3. Simvastatin 20 mg nightly.  4. Famciclovir 500 mg t.i.d.  5. Ultram 50 mg q.6 h. p.r.n. chest wall/incisional pain.   OUTSTANDING LABORATORY STUDIES:  None.   DURATION OF DISCHARGE ENCOUNTER:  Forty five minutes including physician  time.      Laurie [name], ANP      Jesse Sans. Daleen Squibb, MD, [facility]  Electronically Signed    CB/MEDQ  D:  09/24/2008  T:  09/25/2008  Job:  [id number]   cc:   Laurie [name], M.D.  Salvatore Decent. Cornelius Moras, M.D.

## 2011-04-14 NOTE — Discharge Summary (Signed)
NAMEKARRISSA, PARCHMENT NO.:  [id number]   MEDICAL RECORD NO.:  [id number]         PATIENT TYPE:  CINP   LOCATION:                               FACILITY:  [facility]   PHYSICIAN:  Salvatore Decent. Cornelius Moras, M.D. DATE OF BIRTH:  [birth year]   DATE OF ADMISSION:  08/28/2008  DATE OF DISCHARGE:  09/03/2008                               DISCHARGE SUMMARY   ADDENDUM   The patient had a urinalysis done on September 02, 2008, which had shown  trace leukocytes.  Urine microscopic was 0-2, however, the urine culture  did show greater than 100,000 colonies/mL multiple bacterial  morphotypes.  A script was called in for Cipro 500 mg p.o. 2 times daily  for 5 days for treatment for this.      Doree Fudge, PA      [place] H. Cornelius Moras, M.D.  Electronically Signed    DZ/MEDQ  D:  09/04/2008  T:  09/05/2008  Job:  [id number]

## 2011-04-14 NOTE — Assessment & Plan Note (Signed)
[facility]                            CARDIOLOGY OFFICE NOTE   ROSELYNN, WHITACRE                  MRN:          [id number]  DATE:09/12/2008                            DOB:          07-29-1935    Ms. Laurie [name] is a very pleasant 76 year old female that I recently saw  on July 25, 2008, secondary to aortic stenosis.  She underwent cardiac  catheterization on August 03, 2008.  She was found to have a 50%  midportion lesion in the distal LAD.  There was no obstructive disease  noted.  She was subsequently referred for aortic valve replacement and  was seen by Dr. Cornelius Moras.  Though preoperative workup revealed that she did  have a spiculated mass in the right middle lobes suspicious for lung  cancer.  However, workup showed no metastatic disease.  Therefore, on  August 28, 2008, she underwent aortic valve replacement with a  pericardial tissue valve, septal myomectomy and a right middle lobectomy  successfully.  Postoperative, she did have some atrial fibrillation and  was placed on amiodarone.  This should also be noted that the  preoperative evaluation revealed a 40-60% internal carotid artery  stenosis on the left adenoma and none on the right.  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  Following discharge, she did develop nausea, but this  improved after discontinuing amiodarone.  She now denies any dyspnea on  exertion, orthopnea, PND, pedal edema, palpitations, presyncope, syncope  or exertional chest pain.  Her chest is sore since her surgery.  Again  nausea has resolved.  Note, her iron apparently has been found to be low  and she has had a problem with GI blood loss in the past.   PRESENT MEDICATIONS:  1. Aspirin 325 mg p.o. daily.  2. Lopressor 50 mg to p.o. b.i.d.  3. Zocor 20 mg p.o. daily.   PHYSICAL EXAMINATION:  VITAL SIGNS:  Shows a blood pressure of 153/73  and  pulse 61.  She weighs 173 pounds.  HEENT:  Normal.  NECK:  Supple.  CHEST:  Clear.  CARDIOVASCULAR:  Regular rhythm.  There is a 2/6 systolic murmur at the  left sternal border.  There is no diastolic murmur noted.  Her  sternotomy is without evidence of infection.  ABDOMEN:  No tenderness.  EXTREMITIES:  No edema.   Her electrocardiogram shows a sinus rhythm at a rate of 62.  The axis is  normal.  There is left ventricle hypertrophy with repolarization  abnormalities.  There is also inferior T-wave inversion.   DIAGNOSES:  1. Status post aortic valve replacement with a #21 [employer] ease      pericardial tissue valve - The patient will continue with subacute      bacterial endocarditis prophylaxis.  I will also schedule her to      have a baseline transthoracic echocardiogram.  2. Status post resection of lung cancer - This is being followed and      managed by Dr. Cornelius Moras.  3. History of moderate coronary artery disease  and cerebrovascular      disease - She will continue on her aspirin, but we will decrease      this to 81 mg p.o. daily as apparently there is problems with      gastrointestinal blood loss.  We will also continue on her statin.      She will need followup carotid Dopplers in 1 year.  4. 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  5. Tobacco abuse - Now resolved.  6. History of iron-deficiency anemia - This is being managed by her      hematologist.  7. Postoperative atrial fibrillation - The patient's amiodarone has      been discontinued due to nausea.  She remains in sinus rhythm and      we will follow this expectantly.  8. Hypertension - Her blood pressure is mildly elevated, but she      states it is running in the 120-130 over 70 range at home.  We will      track this and adjust as indicated.  9. Hyperlipidemia - She will continue on statin and we will need to       check lipids and liver in 6 weeks.   I will see her back in approximately 3 months.     Madolyn Frieze Jens Som, MD, [facility]  Electronically Signed    BSC/MedQ  DD: 09/12/2008  DT: 09/13/2008  Job #: [id number]

## 2011-04-14 NOTE — H&P (Signed)
HISTORY AND PHYSICAL EXAMINATION   August 07, 2008   Re:  Laurie [name], [place] A           DOB:  [birth year]   REQUESTING PHYSICIAN:  Laurie [name]. Laurie Som, MD, [facility]   REASON FOR CONSULTATION:  Severe aortic stenosis.   HISTORY OF PRESENT ILLNESS:  He is a 76 year old previously healthy  female from [place] with long history of aortic valve murmur that  she has known about for years.  She has chronic iron deficient anemia,  and when she was recently evaluated by her hematologist, it was  recommended that she undergo an echocardiogram for followup of possible  aortic stenosis.  A 2-D echocardiogram was performed at [facility] in [place] on July 13, 2008.  This revealed severe aortic  stenosis with peak and mean gradients across the aortic valve estimated  96 and 64 mmHg respectively with peak velocity across the valve of 490  cm/sec.  Aortic valve area was estimated between 0.88 and 0.95 sq. cm.  The patient apparently was referred to a surgeon in [place], but  she says only decided to seek a second opinion to have her care  addressed elsewhere.  She was evaluated by Dr. Olga [name] at the  [facility] office on July 25, 2008.  He reviewed her  echocardiogram confirming the presence of severe aortic stenosis and  subsequently scheduled her for elective left and right heart  catheterization.  This was performed by Dr. Bonnee [name] on August 03, 2008, at [facility].  Cardiac catheterization  demonstrates no significant coronary artery disease.  Pulmonary artery  pressures were mildly elevated.  Gradients across the aortic valve were  not measured.  This well born has now been referred for possible  elective aortic valve replacement.   REVIEW OF SYSTEMS:  GENERAL:  The patient reports normal appetite.  She  has not been gaining or losing weight.  She does report a several year  history of exertional  fatigue.  This was initially attributed to chronic  anemia, but over the last year she has suffered from worsening  exertional fatigue despite normal hemoglobin levels.  CARDIAC:  The patient describes mild exertional shortness of breath.  This usually only occurs with moderate-to-strenuous physical activity.  She denies resting shortness of breath, PND, orthopnea, or lower  extremity edema.  She has not had any chest pain.  She denies any dizzy  spells or syncope.  RESPIRATORY:  Notable for intermittent cough.  The patient continues to  smoke.  She denies productive cough, hemoptysis, wheezing.  GASTROINTESTINAL:  Negative.  The patient had no difficulty swallowing.  She denies any hematochezia, hematemesis, melena.  Bowel function is  regular.  GENITOURINARY:  Negative.  The patient denies urinary urgency or  frequency.  PERIPHERAL VASCULAR:  Negative.  The patient denies symptoms suggestive  of claudication.  MUSCULOSKELETAL:  Negative.  The patient has mild arthralgias, but  denies any significant problems with arthritis.  NEUROLOGIC:  Negative.  The patient denies any symptoms suggestive of  previous TIA or stroke.  PSYCHIATRIC:  Negative.  HEENT:  Negative.  The patient sees her dentist regularly and is treated  with dental prophylaxis routinely for her known heart murmur.  HEMATOLOGIC:  Notable for chronic anemia for which she gets seen on a  regular basis by her hematologist.   PAST MEDICAL HISTORY:  1. Aortic stenosis.  2. Iron-deficient anemia with history of gastric and small bowel AVMs  discovered all on capsule endoscopy in the past, not active      recently.  3. Longstanding tobacco abuse.   PAST SURGICAL HISTORY:  1. Bilateral cataract surgery.  2. Tonsillectomy.  3. Appendectomy.  4. Left foot surgery.   FAMILY HISTORY:  Notable for the absence of premature coronary artery  disease or significant valvular heart disease.   SOCIAL HISTORY:  The patient is  widowed and lives alone in [place].  She has 2 grown sons who live nearby.  She is retired having previously  worked in the [employer].  She enjoys reading.  She has longstanding  history of tobacco abuse, currently smoking less than one-half pack of  cigarettes per day.  She has smoked for many years.  She denies alcohol  consumption.   CURRENT MEDICATIONS:  None.   DRUG ALLERGIES:  None known.   PHYSICAL EXAMINATION:  The patient is a well-appearing female who  appears somewhat younger than stated age in no acute distress.  Blood  pressure 117/70, pulse 100 and regular, oxygen saturation 94% on room  air.  HEENT:  Unrevealing.  The neck is supple.  There are no carotid  bruits.  There is no lymphadenopathy.  Auscultation of the chest  demonstrates clear breath sounds which are symmetrical bilaterally.  No  wheezes or rhonchi are demonstrated.  Cardiovascular:  Includes regular  rate and rhythm.  No murmurs, rubs, or gallops.  There is a prominent  grade 3-4/6 crescendo-decrescendo systolic murmur heard best along the  right sternal border.  No diastolic murmurs are noted.  The abdomen is  soft, nondistended, nontender.  Bowel sounds are present.  Extremities:  Warm and well-perfused.  Femoral pulses are palpable, but somewhat weak.  Distal pulses are thready, but palpable in the dorsalis pedis and  posterior tibial position.  There is no sign of significant venous  insufficiency.  The skin is clean, dry, healthy-appearing throughout.  Rectal and GU exams are both deferred.  Neurologic:  Examination is  grossly nonfocal and symmetrical throughout.   DIAGNOSTIC TESTS:  A 2-D echocardiogram performed at [facility] in [place] on July 13, 2008 is reviewed.  This  demonstrates heavily calcified aortic valve with severe aortic stenosis.  There is no significant aortic insufficiency.  Left ventricular outflow  tract diameter is estimated 2.1 cm.  Aortic root  diameter is estimated  3.4 cm.  Left ventricular systolic function appears normal.  There is  mild concentric left ventricular hypertrophy.  There is no mitral  regurgitation.  No other abnormalities are noted.   Left and right heart catheterization performed August 03, 2008, by Dr.  Riley Kill is reviewed.  This demonstrates normal coronary artery anatomy  with no significant coronary artery disease.  Left ventricular function  was not assessed.  PA pressures measured 32/18 with pulmonary capillary  wedge pressure 17 central venous pressure of 9.  Cardiac output measured  4.2 L per minute using thermodilution, 4.3 L per minute using the Fick  method.  Both correspond to a cardiac index of 2.2.  The aortic root was  mildly dilated, but did not appear heavily calcified other than severe  calcification of the aortic valve and aortic annulus itself.   IMPRESSION:  Severe aortic stenosis with symptoms of exertional fatigue  and mild exertional shortness of breath.  I believe that Ms. Loletta Specter will  best be treated with elective aortic valve replacement.  She has normal  left ventricular systolic function and otherwise few  comorbid medical  problems.  She does continues to smoke.  Risks associated with surgery  should be quite low.   PLAN:  I have discussed options at length with Ms. Tolliver and her son  here in the office today.  The relative risks of continued medical  therapy versus proceeding with surgery have been discussed.  Surgical  options have been reviewed including whether to replace her valve using  a bioprosthetic tissue valve or mechanical prosthesis.  Under the  circumstances, I would strongly favor a bioprosthetic tissue valve given  her age and obvious reasons to avoid long-term anticoagulation.  We have  also discussed the relative risks and benefits of minimally invasive  approach for aortic valve replacement including miniature anterior  thoracotomy versus partial upper  sternotomy versus full conventional  sternotomy.  All their questions have been addressed.  Ms. Loletta Specter seemed  inclined to proceed with surgery, but she want to talk about things with  her family before scheduling it at this time.  She is interested in  partial upper sternotomy for minimally invasive approach, but seems less  inclined to go with miniature and anterior thoracotomy at the urging of  her son.  All their questions have been addressed.  They will contact  our office in the near future to make further plans.   Salvatore Decent. Cornelius Moras, M.D.  Electronically Signed   CHO/MEDQ  D:  08/07/2008  T:  08/08/2008  Job:  [id number]   cc:   Laurie [name]. Laurie Som, MD, [facility]

## 2011-04-14 NOTE — Assessment & Plan Note (Signed)
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  She was last seen  here in our office on October 15, 2008.  Since then, the patient has  done well.  She has been participating in the cardiac rehabilitation  program, which she enjoys.  Her exercise tolerance is quite good and now  much better than it was prior to surgery.  She continues to abstain from  tobacco use and she is quite pleased to state that how much better she  feels.  She still has some soreness in her anterior chest that has been  exacerbated by using the arm machines at the cardiac rehabilitation  program.  She denies any sensation of clicking or instability of the  sternum, and her soreness is relatively mild.  She otherwise feels well.  She has been under some stress at home for family reasons, but otherwise  she has no complaints.  She denies any fevers or chills.  She denies any  exertional chest discomfort.  She has not had shortness of breath.   CURRENT MEDICATIONS:  Enteric-coated aspirin 81 mg daily, metoprolol 50  mg twice daily, and lisinopril 10 mg daily.   PHYSICAL EXAMINATION:  GENERAL:  Notable for well-appearing female.  VITAL SIGNS:  Blood pressure 141/74, pulse 70, and oxygen saturation 95%  on room air.  CHEST:  [name] median sternotomy scar that has healed nicely.  The sternum is  stable on palpation.  Breath sounds are clear to auscultation and  symmetrical bilaterally.  CARDIOVASCULAR:  Regular rate and rhythm.  There is [name] grade 2/6 systolic  murmur heard at  the right upper sternal border.  No diastolic murmurs  are noted.  ABDOMEN:  Soft, nontender.  EXTREMITIES:  Warm and well perfused.  There is no lower extremity  edema.   DIAGNOSTIC TEST:  Chest x-ray obtained at the [facility]  today is reviewed.  This demonstrates clear lung fields bilaterally with  improved aeration on the right side compared with the last previous  exam.  There are no pleural effusions.  No other abnormalities are  appreciated.   IMPRESSION:  Stable progress now nearly 59-months status post aortic  valve replacement with septal myomectomy and right middle lobectomy for  aortic stenosis, subvalvular left ventricular outflow tract obstruction,  and T2 N0 stage IB moderately-differentiated squamous cell carcinoma of  the lung.  The patient is doing well.   PLAN:  I have encouraged the patient to continue to increase her  activity as tolerated, but I have suggested that she should cut back on  the use of the arm machines until the soreness in her breast bone has  subsided.  All of her questions have been addressed.  I have given her [name]  prescription for Xanax tablets to take as needed for anxiety given the  amount of stress she has been under at home recently.  We will plan to  see her back in 3 months with [name] chest CT scan.   Salvatore Decent. Cornelius Moras, M.D.  Electronically Signed   CHO/MEDQ  D:  12/24/2008  T:  12/24/2008  Job:  [id number]   cc:   Madolyn Frieze. Jens Som, MD, [facility]

## 2011-04-14 NOTE — Assessment & Plan Note (Signed)
[facility]                            CARDIOLOGY OFFICE NOTE   Laurie [name], Laurie [name]                  MRN:          [id number]  DATE:01/30/2009                            DOB:          October 14, 1935    Laurie [name] is a pleasant female who has a history of aortic stenosis  status post aortic valve replacement in September 2009.  She had a  pericardial tissue valve.  She also had a septal myectomy and a right  middle lobectomy due to lung CA.  Note, she has a 50% mid LAD by  catheterization preoperatively and a 40-60% internal carotid artery  stenosis on the left.  She also has a 16-mm aneurysmal dilatation of the  right common iliac.  Since I last saw her, she continues to improve from  her surgery.  She denies any dyspnea or exertional chest pain.  Her  chest does remain sore.  There is no pedal edema, and there is no  syncope.  Also note that she has had an intolerance to Zocor in the past  due to myalgias.   Her present medications include:  1. Lopressor 50 mg p.o. b.i.d.  2. Aspirin 81 mg p.o. daily.  3. Lisinopril 20 mg p.o. daily.   Physical exam today shows a blood pressure of 140/68 and her pulse is  62.  She weighs 174 pounds.  HEENT is normal.  Neck is supple.  Chest is  clear.  Cardiovascular exam reveals a regular rate and rhythm.  There is  a 2/6 systolic murmur at the left sternal border.  There is no diastolic  murmur noted.  Abdominal exam shows no tenderness.  Extremities show no  edema.   Her electrocardiogram shows a sinus rhythm at a rate of 77.  The axis is  normal.  There is left ventricular hypertrophy with repolarization  abnormalities.   DIAGNOSES:  1. Status post aortic valve replacement with #21 Edwards Magna      pericardial tissue valve - she will continue with SBE prophylaxis.  2. History of lung cancer status post resection - she is scheduled for      a followup CAT scan of her chest per Dr. Cornelius [name].  3. History  of moderate coronary disease and cerebrovascular disease -      she will continue on her aspirin.  She did not tolerate ZOCOR in      the past due to myalgias.  I will add Crestor 10 mg p.o. daily to      see if she tolerates.  If so, we will check lipids and liver in 6      weeks and adjust as indicated.  She will continue on her beta-      blocker.  She will need followup carotid Dopplers in October 2010.  4. History of 16-mm aneurysmal dilatation of the right common iliac      artery - we will plan a followup ultrasound in October 2010 as      well.  5. Tobacco abuse, now resolved.  6. History of iron-deficiency anemia.  7. Hypertension - her blood pressure  appears to be adequately      controlled on present medications.  She stated her systolic      typically runs in the 120 range.  8. Hyperlipidemia - as per above, we will begin Crestor and check      lipids and liver in 6 weeks.  We will also check a BMET in 6 weeks.   We will see her back in 6 months.     Laurie Frieze Jens Som, MD, [facility]  Electronically Signed    BSC/MedQ  DD: 01/30/2009  DT: 01/31/2009  Job #: [id number]

## 2011-04-14 NOTE — Assessment & Plan Note (Signed)
OFFICE VISIT   Laurie [name], Laurie [name]  DOB:  [birth year]                                        August 15, 2008  CHART #:  [id number]   HISTORY OF PRESENT ILLNESS:  The patient returns to the office today  having previously been scheduled for aortic valve replacement.  However,  routine preoperative chest x-ray performed on August 13, 2008,  revealed [name] suspicious mass in the right lung.  She underwent chest CT  scan with intravenous contrast yesterday.  This confirms [name] spiculated 3-  cm mass in the right middle lobe suspicious for primary lung cancer.  There is an additional 1.3-cm nodular opacity peripherally in the right  middle lobe that is suspicious for satellite metastasis.  There is no  sign of mediastinal lymphadenopathy.  No other significant pulmonary  parenchymal lesions are noted.  There are no pleural effusions.  There  is no sign of metastasis in the liver or adrenal glands.   I discussed these findings at length with the patient and her son here  in the office today.  We will plan to proceed with MRI of the brain and  PET/CT scan for more definitive clinical staging.  We will get some  baseline pulmonary function tests as well.  We will plan to see the  patient back as soon as these tests have been completed to make further  plans for her treatment.  All of their questions have been addressed.   Salvatore Decent. Cornelius Moras, M.D.  Electronically Signed   CHO/MEDQ  D:  08/15/2008  T:  08/15/2008  Job:  [id number]   cc:   Madolyn Frieze. Jens Som, MD, [facility]

## 2011-04-14 NOTE — H&P (Signed)
[name], Laurie [name] NO.:  [id number]   MEDICAL RECORD NO.:  [id number]          PATIENT TYPE:  INP   LOCATION:  2004                         FACILITY:  [facility]   PHYSICIAN:  Glennie Isle, MD   DATE OF BIRTH:  [birth year]   DATE OF ADMISSION:  09/22/2008  DATE OF DISCHARGE:                              HISTORY & PHYSICAL   CHIEF COMPLAINT:  Palpitations, sweats, and Shingles.   HISTORY OF PRESENT ILLNESS:  The patient is a 76 year old status post  aortic valve replacement (by prosthetic aortic valve performed on  August 28, 2008) who did well postoperatively, who currently presents  with palpitations and sweating episodes since yesterday. The patient  states that she had a flu shot on Thursday. She subsequently developed a  neck bump on Friday and rash. She was diagnosed with Shingles by her  primary care physician on Friday and prescribed Famciclovir. The rash  has further spread to her supraclavicular region on the left side, as  well as the left shoulder. The patient does states that she has had  palpitations yesterday for the heart rate up to 115 for approximately 3  hours. This morning, she also had some elevated heart rate up to 100 and  105. She states that she was sweating several times last night, though  denies any fevers. She subsequently decided to come to the emergency  room this morning. She denies fevers or any sick contacts. She denies  any cough, sputum, or dysuria. The patient was afebrile in the emergency  room with initial heart rate of 107 per emergency room. Her EKG per  emergency room note showed T wave inversions in the 1, AVL, V4 to V6,  which were similar to August 29, 2008. The patient does also endorse  mild chest discomfort from the sternotomy with palpation. She has been  stating that her sternotomy site has been healing well. She believes  that her palpitations are related to the Famciclovir, started yesterday.   PAST  MEDICAL HISTORY:  1. Status post aortic valve replacement August 28, 2008.  2. Postoperative atrial fibrillation.  3. Status post lung mass resection (right middle lobectomy.)  4. Peripheral vascular disease.  5. History of 40% to 52% left ICA and right common iliac aneurysm.  6. Iron deficiency anemia.  7. Hypertension.  8. Tobacco abuse.  9. Left adrenal adenoma.   SOCIAL HISTORY:  She is widowed. She smokes 1/2 pack per day. Denies  alcohol or IV drug use.   FAMILY HISTORY:  Noncontributory.   REVIEW OF SYSTEMS:  A thorough 14 point review of systems was performed  and was negative, except as noted per HPI. Again, the patient did have a  skin rash since yesterday but denies any myalgias, cardiovascular,  gastrointestinal, or endocrine problems.   ALLERGIES:  VIOXX.   MEDICATIONS:  1. Aspirin 81 mg daily.  2. Lopressor 50 mg b.i.d.  3. Zocor 20 mg q.h.s.  4. Famciclovir 500 mg daily.   PHYSICAL EXAMINATION:  VITAL SIGNS:  Temperature 98.9, pulse 101, blood  pressure 113/66. Sating at 98% on room air.  GENERAL:  The  patient is clammy.  HEENT:  Normocephalic and atraumatic. Pupils are equal, round, and  reactive to light. Extraocular movements intact. Oropharynx clear. I see  no evidence of any rash.  NECK:  Supple. No thyromegaly appreciated. There is a left carotid  bruit.  CARDIOVASCULAR:  Systolic ejection murmur grade 2/6 at right upper  sternal border. No diastolic murmur. No S3 or S4.  LUNGS: Decreased breath sounds at the bases. Positive E to A changes,  left side.  SKIN:  Left posterior neck has a raised lesion. There are several  lesions as well, right below her neck and also on her supraclavicular on  the left side, as well as left shoulder. The rash is raised and does  have some tiny vesicles on her left shoulder area. There is erythema,  though no warmth and is not painful to palpation. Does not blanch. The  patient's sternotomy site is well healed. No  evidence of exudative  discharge. No evidence of any current infection on her sternotomy site.  ABDOMEN:  Soft, nontender, and nondistended.  EXTREMITIES:  No clubbing, cyanosis, or edema noted. There is no lower  extremity edema.  NEUROLOGIC:  Alert and oriented times three. Cranial nerves 2-12 are  intact.  MUSCULOSKELETAL:  No joint deformity is noted.   DIAGNOSTIC STUDIES:  Chest x-ray at outside hospital per report showing  clearing of her apical pneumothorax as well as hydropneumothorax and  clearing of left pleural effusion, which cannot rule out of residual  tumor as well.   EKG is currently pending.   LABORATORY DATA:  From outside hospital emergency room, white count of  14.4 and neutrophils of 77%. Hemoglobin of 13.6, platelets of 464,000.  BMP shows sodium of 131, potassium 4.3, BUN 13, creatinine 0.9, troponin  less than 0.05. Urinalysis showed nitrite negative, leukocyte esterase  small, and 3 to 6 white blood cells. Positive ketones.   ASSESSMENT/PLAN:  1. PALPITATIONS/SWEATING:  There is concern for endocarditis given      recent new labs as well as elevated white count. We will obtain a      transthoracic echocardiography, blood cultures, TSH. We will start      broad spectrum antibiotics including Vancomycin and Zosyn. We will      continue the patient on telemetry. She does have a history of      postoperative atrial fibrillation and was on amiodarone, however,      the amiodarone was discontinued secondary to nausea a couple of      weeks ago. We will continue Metoprolol and IV fluids. The patient      may need TEV if she does spike a fever, despite antibiotics or      continues to have elevated white count.  2. QUESTIONABLE SHINGLES:  Will consider ID consult in the morning.      She does have some small vesicles, which may be consistent with      active lesions. We will start Acyclovir in the morning. I doubt her      palpitations are related to the  famciclovir.  3. DEEP VEIN THROMBOSIS PROPHYLAXIS:  Will continue Lovenox.  4. HISTORY OF LUNG MASS:  Again as per chest x-ray, they cannot rule      out  residual tumor. Will consider getting a CT scan if requested      by our thoracic colleagues that performed surgery.      Glennie Isle, MD  Electronically Signed     SS/MEDQ  D:  09/22/2008  T:  09/22/2008  Job:  [id number]   cc:   Madolyn Frieze. Jens Som, MD, [facility]

## 2011-04-14 NOTE — Assessment & Plan Note (Signed)
OFFICE VISIT   Laurie [name], Laurie [name]  DOB:  07/08/1935                                        September 09, 2009  CHART #:  [id number]   The patient returns to the office today for followup now approximately 1  year status post aortic valve replacement, septal myomectomy, and right  middle lobectomy for severe aortic stenosis with subvalvular left  ventricular outflow tract obstruction and concomitant T2N0 stage IB  moderately differentiated squamous cell carcinoma of the lung.  She was  last seen here in the office on Apr 15, 2009.  Since then, the patient  has continued to do quite well.  She states that her exercise tolerance  is stable and overall pretty good.  She still gets tired and short of  breath if she pushes herself, but overall she notes that her exercise  tolerance is dramatically better than it was prior to her surgery last  year.  She has no significant residual soreness in her chest.  Her only  significant complaint is that related to some mild chronic pain of her  neck related to degenerative disk disease of the cervical spine.  She  does not have any associated neurologic symptoms.  She does not have any  other significant aches or pains to speak of, other than mild arthritis  afflicting her right hip.  Her appetite is good.  Her weight is stable.  The remainder of her review of systems is unremarkable.  The remainder  of her past medical history is unchanged.  She did have abdominal  ultrasound performed recently at [facility] demonstrating [name]  small (3.2 cm) infrarenal abdominal aortic aneurysm.   CURRENT MEDICATIONS:  Aspirin, metoprolol, lisinopril, multivitamin,  vitamin C, vitamin D, and vitamin B.   PHYSICAL EXAMINATION:  Vital Signs:  Notable for [name] well-appearing female  with blood pressure 137/75, pulse 70 and regular, and oxygen saturation  93% on room air.  HEENT:  Unremarkable.  There is no palpable cervical  or  supraclavicular lymphadenopathy.  Chest:  Auscultation of the chest  demonstrates clear breath sounds, which are symmetrical bilaterally.  No  wheezes or rhonchi noted.  Cardiovascular:  Regular rate and rhythm.  No  murmurs, rubs, or gallops are noted.  Abdomen:  Soft and nontender.  Extremities:  Warm and well perfused.  There is no lower extremity  edema.  There is no bony tenderness on palpation of the arms, legs,  thoracic spine, or lumbar spine.  Neurologic:  Grossly nonfocal.   DIAGNOSTICS TEST:  Chest x-ray performed today at the [facility] is reviewed.  This demonstrates clear lung fields bilaterally.  There are stable changes on the right lung field consistent with  previous right middle lobectomy.  There is stable mild nodularity  appreciated both at the right apex and right lung base.  No other  significant abnormalities are noted.  There are no obvious new pulmonary  parenchymal lesions or other issues identified.   IMPRESSION:  The patient seems to be doing well just over 1 year status  post aortic valve replacement, septal myomectomy, and right middle  lobectomy.  At this point, there are no clinical symptoms or physical  signs to suggest locally [name] recurrent or distant metastatic disease.  She  does have stable chronic nodularity in the right lung  field that appears  likely benign.   PLAN:  We will have the patient return for further routine followup and  surveillance in 6 months.  We will get [name] chest CT scan at that time for  more discrete followup with comparison with CT scan performed last  spring.  All of her questions have been addressed.   Salvatore Decent. Cornelius Moras, M.D.  Electronically Signed   CHO/MEDQ  D:  09/09/2009  T:  09/10/2009  Job:  [id number]   cc:   Madolyn Frieze. Jens Som, MD, [facility]  Nani Gasser, M.D.  Bethena Roys

## 2011-04-14 NOTE — Assessment & Plan Note (Signed)
OFFICE VISIT   Laurie [name], Laurie [name]  DOB:  08/23/1935                                        Apr 15, 2009  CHART #:  [id number]   The patient returns to the office today for further followup, status  post aortic valve replacement, septal myomectomy, and right middle  lobectomy for severe aortic stenosis with subvalvular left ventricular  outflow tract obstruction and concomitant T2, N0 stage IB moderately  differentiated squamous cell carcinoma of the lung.  She was last seen  here in the office on February 18, 2009.  At that point, she was seen as an  unscheduled visit due to some atypical chest pain.  Following that  office visit, she tried taking Naprosyn for 2 weeks, but this caused  some significant GI upset and was discontinued.  She also stopped taking  Crestor after she saw Dr. Jens Som in followup.  More recently she has  been taking Protonix.  She was seen by her primary care physician, Dr.  Linford Arnold, and she was told that if her stomach problems did not improve,  she might need to consider evaluation by [name] gastroenterologist.  She  apparently know [name] gastroenterologist in [facility] who she has seen  in the past, but she has not yet sought out this evaluation.  More  recently over the last few weeks, she has had some problems with vague  atypical headaches.  She went to the emergency room in [facility] and [name]  brain scan was performed that according to the patient was normal.  She  has asked for Xanax and pain medications to alleviate the symptoms.  The  remainder of her review of systems is unremarkable.   Current medications include aspirin 81 mg daily, metoprolol 50 mg daily,  lisinopril 20 mg daily, and Protonix 40 mg daily.   PHYSICAL EXAMINATION:  GENERAL:  [name] well-appearing female.  VITAL SIGNS:  Blood pressure 143/83, pulse 86, and oxygen saturation 96%  on room air.  HEENT:  Unrevealing.  NECK:  There is no palpable lymphadenopathy.  LUNGS:  Auscultation of the chest reveals clear breath sounds which are  symmetrical.  No wheezes or rhonchi are noted.  CARDIOVASCULAR:  Regular rate and rhythm.  There is [name] grade 2/6 systolic  murmur.  No diastolic murmurs are noted.  ABDOMEN:  Soft and nontender.  EXTREMITIES:  Warm and well perfused.  There is no lower extremity  edema.  There is no bony tenderness.   DIAGNOSTIC TEST:  Chest CT scan performed today at the [facility] is reviewed.  This demonstrates signs consistent with  right middle lobectomy and associated small loculated pleural effusion  in this region.  The previously noted nodule in the left adrenal gland  remains stable, suggesting the presence of [name] benign adenoma.  The very  small left lower lobe nodule is also completely unchanged.  No other  significant abnormalities are noted.   IMPRESSION:  The patient has no clinical signs nor symptoms to suggest  locally recurrent or distant metastatic disease of her lung cancer.  She  continues to have [name] wide variety of physical complaints that seem to wax  and wane and change from time to time.  She is requesting refills of  pain medication including all narcotics and she is also requesting some  Xanax.  I have suggested that these might best be treated by her primary  care physician, and I am reluctant to refill such prescriptions at this  time.   PLAN:  We will see the patient back in 4 months with [name] routine chest x-  ray.  All of her questions have been addressed.   Salvatore Decent. Cornelius Moras, M.D.  Electronically Signed   CHO/MEDQ  D:  04/15/2009  T:  04/16/2009  Job:  [id number]   cc:   Madolyn Frieze. Jens Som, MD, [facility]  Nani Gasser, M.D.

## 2011-06-30 ENCOUNTER — Encounter: Payer: Self-pay | Admitting: Family Medicine

## 2011-06-30 ENCOUNTER — Ambulatory Visit (INDEPENDENT_AMBULATORY_CARE_PROVIDER_SITE_OTHER): Payer: Medicare Other | Admitting: Family Medicine

## 2011-06-30 DIAGNOSIS — F411 Generalized anxiety disorder: Secondary | ICD-10-CM

## 2011-06-30 DIAGNOSIS — R109 Unspecified abdominal pain: Secondary | ICD-10-CM | POA: Insufficient documentation

## 2011-06-30 MED ORDER — ALPRAZOLAM 0.5 MG PO TABS: 0.5000 mg | ORAL_TABLET | Freq: Three times a day (TID) | ORAL | Status: DC | PRN

## 2011-06-30 MED ORDER — AMITRIPTYLINE HCL 25 MG PO TABS: 25.0000 mg | ORAL_TABLET | Freq: Every day | ORAL | Status: DC

## 2011-06-30 NOTE — Assessment & Plan Note (Signed)
Diffuse chronic pain with hx of normal labs and CT for the same in April.  Will recheck labs today given her hx of diverticulitis and GI bleeding.  She is hemodynamically stable and has no sign of acute abdomen today.  F/u lab results tomorrow.  Go to the ED if any rectal bleeding occurs.  She saw Dr Eliberto Ivory for this earlier this yr but failed to go back.  I suspect this is IBS and maybe also some dyspepsia.

## 2011-06-30 NOTE — Assessment & Plan Note (Signed)
Worsened.  She thinks that her mood is secondary to pain but she does have a long hx of anxiety which may be causing an IBS picture since her labs/ CT abd were inconclusive back in April.    Will start her on amitriptyline at night for chronic pain/ sleep/ mood with Alprazolam up to 3 x a day for anxiety for now.  Declined counseling today.  F/u in 1 month.  Call if any problems.

## 2011-06-30 NOTE — Progress Notes (Signed)
  Subjective:    Patient ID: Laurie [name], female    DOB: 09/16/1935, 76 y.o.   MRN: [id number]  HPI  76 yo WF presents for months of abdominal pain and feeling very anxious.  She is having abdominal cramping all over.  She has problems with constipation but no diarrhea.  She has no appetite and feels worse after she eats.  She has a sore on the back of her neck.  She has pain all the time.  She feels like her pain has caused her to feel depressed.  She cannot sleep well.  Her weight is unchanged.  No blood in her stool.  She had a colonoscopy done about 6 yrs ago.  She had labs and CT abd/ pelvis done for the same pain in April and it was normal other than fatty liver dz and mild biliary tract dilation.    BP 144/84  Pulse 79  Temp(Src) 98.2 F (36.8 C) (Oral)  Ht 5' 7.75" (1.721 m)  Wt 184 lb (83.462 kg)  BMI 28.18 kg/m2  SpO2 97% Patient Active Problem List  Diagnoses  . MALIGNANT NEOPLASM MIDDLE LOBE BRONCHUS OR LUNG  . THYROMEGALY  . THYROID NODULE  . DYSLIPIDEMIA  . ANEMIA, IRON DEFICIENCY  . ANXIETY STATE, UNSPECIFIED  . ESSENTIAL HYPERTENSION, BENIGN  . CAD  . CAROTID ARTERY DISEASE  . TIA  . ILIAC ARTERY ANEURYSM  . SIALOLITHIASIS  . DIVERTICULOSIS OF COLON  . NECK PAIN  . OSTEOPENIA  . AORTIC VALVE REPLACEMENT, HX OF  . DERMATITIS  . HYPERGLYCEMIA, MILD  . GASTROINTESTINAL HEMORRHAGE, HX OF  . Epigastric pain  . Right flank pain      Review of Systems  Constitutional: Positive for appetite change and fatigue. Negative for fever, chills and unexpected weight change.  Respiratory: Negative for cough and shortness of breath.   Cardiovascular: Negative for chest pain, palpitations and leg swelling.  Gastrointestinal: Positive for nausea, abdominal pain, constipation and abdominal distention. Negative for vomiting, diarrhea and blood in stool.  Genitourinary: Positive for pelvic pain. Negative for frequency and flank pain.  Psychiatric/Behavioral: Positive  for sleep disturbance, dysphoric mood and decreased concentration. The patient is nervous/anxious.        Objective:   Physical Exam  Constitutional: She appears well-developed and well-nourished.  Cardiovascular: Normal rate, regular rhythm and normal heart sounds.   Pulmonary/Chest: Effort normal and breath sounds normal.  Abdominal: Soft. She exhibits distension. She exhibits no mass. There is tenderness (diffuse). There is no rebound and no guarding.  Musculoskeletal: She exhibits no edema.  Skin: Skin is warm and dry. No pallor.  Psychiatric: Her speech is normal. Judgment and thought content normal. Her mood appears anxious. She is agitated and withdrawn. Cognition and memory are normal.          Assessment & Plan:   No problem-specific assessment & plan notes found for this encounter.

## 2011-06-30 NOTE — Patient Instructions (Signed)
Update labs today. Will call you w/ results tomorrow.  Start Amitriptyline at night for chronic pain, sleep, mood. Use Alprazolam up to 3 x a day for anxiety.  Cut in half if they make you too sleepy.   Return for f/u in 4 wks.

## 2011-07-01 ENCOUNTER — Telehealth: Payer: Self-pay | Admitting: Family Medicine

## 2011-07-01 LAB — CBC WITH DIFFERENTIAL/PLATELET
Eosinophils Absolute: 0.2 10*3/uL (ref 0.0–0.7)
Eosinophils Relative: 2 % (ref 0–5)
Lymphs Abs: 1.6 10*3/uL (ref 0.7–4.0)
MCH: 29.7 pg (ref 26.0–34.0)
MCHC: 32.3 g/dL (ref 30.0–36.0)
MCV: 91.8 fL (ref 78.0–100.0)
Platelets: 311 10*3/uL (ref 150–400)
RBC: 5.26 MIL/uL — ABNORMAL HIGH (ref 3.87–5.11)

## 2011-07-01 LAB — COMPLETE METABOLIC PANEL WITH GFR
ALT: 25 U/L (ref 0–35)
CO2: 20 mEq/L (ref 19–32)
Creat: 0.73 mg/dL (ref 0.50–1.10)
GFR, Est African American: >60 mL/min (ref >60)
GFR, Est Non African American: >60 mL/min (ref >60)
Glucose, Bld: 121 mg/dL — ABNORMAL HIGH (ref 70–99)
Total Bilirubin: 0.6 mg/dL (ref 0.3–1.2)

## 2011-07-01 LAB — LIPASE: Lipase: 30 U/L (ref 0–75)

## 2011-07-01 NOTE — Telephone Encounter (Signed)
Pls fax copy to her oncologist Dr Abbe Amsterdam.  Pls let pt know that her labs came back normal other than a low sodium.  I'd like her to restrict fluid intake for the next 3-4 days.  Can sip on a little gatorade during this time to try to get sodium up.  Repeat sodium in 1 wk.  (lab only).

## 2011-07-02 NOTE — Telephone Encounter (Signed)
LMOM advising pt of results and rec. Faxed results to oncologist.

## 2011-07-13 ENCOUNTER — Emergency Department (HOSPITAL_BASED_OUTPATIENT_CLINIC_OR_DEPARTMENT_OTHER)
Admission: EM | Admit: 2011-07-13 | Discharge: 2011-07-13 | Disposition: A | Discharge status: Other Acute Inpatient Hospital | Payer: Medicare Other | Source: Home / Self Care | Attending: Emergency Medicine | Admitting: Emergency Medicine

## 2011-07-13 ENCOUNTER — Encounter (HOSPITAL_BASED_OUTPATIENT_CLINIC_OR_DEPARTMENT_OTHER): Payer: Self-pay | Admitting: *Deleted

## 2011-07-13 ENCOUNTER — Other Ambulatory Visit: Payer: Self-pay

## 2011-07-13 ENCOUNTER — Observation Stay (HOSPITAL_COMMUNITY)
Admission: AD | Admit: 2011-07-13 | Discharge: 2011-07-14 | Disposition: A | Discharge status: Home / Self Care | Payer: Medicare Other | Source: Other Acute Inpatient Hospital | Attending: Internal Medicine | Admitting: Internal Medicine

## 2011-07-13 DIAGNOSIS — J4489 Other specified chronic obstructive pulmonary disease: Secondary | ICD-10-CM | POA: Insufficient documentation

## 2011-07-13 DIAGNOSIS — I10 Essential (primary) hypertension: Secondary | ICD-10-CM | POA: Insufficient documentation

## 2011-07-13 DIAGNOSIS — R5381 Other malaise: Secondary | ICD-10-CM | POA: Insufficient documentation

## 2011-07-13 DIAGNOSIS — Z8679 Personal history of other diseases of the circulatory system: Secondary | ICD-10-CM | POA: Insufficient documentation

## 2011-07-13 DIAGNOSIS — R002 Palpitations: Secondary | ICD-10-CM | POA: Insufficient documentation

## 2011-07-13 DIAGNOSIS — Z952 Presence of prosthetic heart valve: Secondary | ICD-10-CM | POA: Insufficient documentation

## 2011-07-13 DIAGNOSIS — Z902 Acquired absence of lung [part of]: Secondary | ICD-10-CM | POA: Insufficient documentation

## 2011-07-13 DIAGNOSIS — I493 Ventricular premature depolarization: Secondary | ICD-10-CM

## 2011-07-13 DIAGNOSIS — I4949 Other premature depolarization: Secondary | ICD-10-CM | POA: Insufficient documentation

## 2011-07-13 DIAGNOSIS — R079 Chest pain, unspecified: Principal | ICD-10-CM | POA: Insufficient documentation

## 2011-07-13 DIAGNOSIS — J449 Chronic obstructive pulmonary disease, unspecified: Secondary | ICD-10-CM | POA: Insufficient documentation

## 2011-07-13 DIAGNOSIS — Z79899 Other long term (current) drug therapy: Secondary | ICD-10-CM | POA: Insufficient documentation

## 2011-07-13 DIAGNOSIS — Z85118 Personal history of other malignant neoplasm of bronchus and lung: Secondary | ICD-10-CM | POA: Insufficient documentation

## 2011-07-13 DIAGNOSIS — R5383 Other fatigue: Secondary | ICD-10-CM | POA: Insufficient documentation

## 2011-07-13 LAB — CBC
HCT: 43.6 % (ref 36.0–46.0)
Hemoglobin: 15.1 g/dL — ABNORMAL HIGH (ref 12.0–15.0)
MCV: 86.2 fL (ref 78.0–100.0)
Platelets: 282 10*3/uL (ref 150–400)
RBC: 5.06 MIL/uL (ref 3.87–5.11)
WBC: 7.9 10*3/uL (ref 4.0–10.5)

## 2011-07-13 LAB — DIFFERENTIAL
Eosinophils Relative: 3 % (ref 0–5)
Lymphocytes Relative: 21 % (ref 12–46)
Lymphs Abs: 1.6 10*3/uL (ref 0.7–4.0)
Monocytes Absolute: 1 10*3/uL (ref 0.1–1.0)
Monocytes Relative: 13 % — ABNORMAL HIGH (ref 3–12)

## 2011-07-13 LAB — COMPREHENSIVE METABOLIC PANEL
ALT: 31 U/L (ref 0–35)
CO2: 25 mEq/L (ref 19–32)
Calcium: 9.3 mg/dL (ref 8.4–10.5)
GFR calc Af Amer: >60 mL/min (ref >60)
GFR calc non Af Amer: >60 mL/min (ref >60)
Glucose, Bld: 102 mg/dL — ABNORMAL HIGH (ref 70–99)
Sodium: 133 mEq/L — ABNORMAL LOW (ref 135–145)

## 2011-07-13 LAB — URINALYSIS, ROUTINE W REFLEX MICROSCOPIC
Bilirubin Urine: NEGATIVE
Ketones, ur: NEGATIVE mg/dL
Nitrite: NEGATIVE
Specific Gravity, Urine: 1.007 (ref 1.005–1.030)
Urobilinogen, UA: 0.2 mg/dL (ref 0.0–1.0)

## 2011-07-13 MED ORDER — ASPIRIN 81 MG PO CHEW
324.0000 mg | CHEWABLE_TABLET | Freq: Once | ORAL | Status: AC
  Administered 2011-07-13: 324 mg via ORAL
  Filled 2011-07-13: qty 4

## 2011-07-13 NOTE — ED Notes (Signed)
Pt states she felt weak yesterday but symptoms resolved- today felt weak "like my heart wasn't beating right"- took b/p and heart rate at home and HR was 41- states that she "feels weak"- family member states checked HR heart manually and it was in 60's but irregular

## 2011-07-13 NOTE — ED Notes (Signed)
Report to Rebecca Martin, RN 

## 2011-07-13 NOTE — ED Provider Notes (Addendum)
Scribed for Dione Booze, MD, the patient was seen in room MH02/MH02. This chart was scribed by [employer]. The patient's care started at 17:44 CSN: [id number] Arrival date & time: 07/13/2011  5:41 PM  Chief Complaint  Patient presents with  . Weakness   HPI  Laurie [name] is a 76 y.o. female with history of HTN who presents to the Emergency Department complaining of palpitations onset 2:30PM. She described these palpitations as her "heart felt like it was not beating right. It felt funny and weak. I felt like I was about to pass out". Associated symptoms include sensation that feels like there is "a cord wrapped around" her left foot, and general weakness. Patient denies any modifying factors. She states that she currently feels better but not "up to par". Patient reports to taking her HTN medications and that her heart rate was at 41. She has a history of similar symptoms for which she has not been evaluated. History of aortic valve replacement. Symptoms were described as moderate to severe. She is somewhat vague as a historian. She had some vague chest tightness at the time her heart did not fell like it was beating right.  HPI ELEMENTS:  Location: heart  Onset: 2:30PM Duration: one hour Modifying factors: none Context:  as above  Associated symptoms: as above      Past Medical History  Diagnosis Date  . Anemia     iron deficiency  . Shingles 11-09  . COPD (chronic obstructive pulmonary disease) 2005    on CXR with R apical scaring  . Atrial fibrillation     post op  . Hypertension   . Cerebrovascular disease    MEDICATIONS:  Previous Medications   ALPRAZOLAM (XANAX) 0.5 MG TABLET    Take 1 tablet (0.5 mg total) by mouth 3 (three) times daily as needed for sleep or anxiety.   AMITRIPTYLINE (ELAVIL) 25 MG TABLET    Take 1 tablet (25 mg total) by mouth at bedtime.   ASPIRIN 81 MG TABLET    Take 81 mg by mouth daily.     LISINOPRIL (PRINIVIL,ZESTRIL) 40 MG TABLET     TAKE 1 TABLET BY MOUTH EVERY DAY   METOPROLOL (LOPRESSOR) 50 MG TABLET    TAKE 1 TABLET BY MOUTH TWO TIMES A DAY     ALLERGIES:  Allergies as of 07/13/2011 - Review Complete 06/30/2011  Allergen Reaction Noted  . Rofecoxib        Past Surgical History  Procedure Date  . Aapy   . Tonsillectomy and adenoidectomy   . U/s guided aspiration of r breast cyst  2009    at the breast clinic  . Rml removed dr Barry Dienes for lung cancer 2009  . Cardiac valve surgery 9-29-9    starting cardiac rehab at [facility]  . Cardiac valve replacement   . Abdominal surgery   . Tonsillectomy     Family History  Problem Relation Age of Onset  . Stroke Father   . Diabetes Son     brittle diabetes    History  Substance Use Topics  . Smoking status: Former Smoker -- 40 years  . Smokeless tobacco: Not on file  . Alcohol Use: No      Review of Systems  Constitutional: Negative for diaphoresis.  Respiratory: Negative for shortness of breath.   Cardiovascular: Negative for chest pain.  Gastrointestinal: Negative for nausea and vomiting.  All other systems reviewed and are negative.    Physical Exam  BP  144/75  Pulse 66  Temp(Src) 98.1 F (36.7 C) (Oral)  Resp 18  Ht 5\' 7"  (1.702 m)  Wt 184 lb (83.462 kg)  BMI 28.82 kg/m2  SpO2 95%  Physical Exam  Constitutional: She is oriented to person, place, and time. She appears well-developed. No distress.  HENT:  Head: Atraumatic.  Mouth/Throat: Oropharynx is clear and moist.  Eyes: Conjunctivae and EOM are normal. Pupils are equal, round, and reactive to light.  Neck: Normal range of motion. Neck supple. No tracheal deviation present.  Cardiovascular: Normal rate.        Heard aortic valve click  Pulmonary/Chest: Effort normal and breath sounds normal. No respiratory distress. She has no wheezes. She has no rales.  Abdominal: Soft. Bowel sounds are normal. She exhibits no distension. There is no tenderness. There is no rebound.  Musculoskeletal:  She exhibits edema (trace, symmetrical).  Neurological: She is alert and oriented to person, place, and time. No cranial nerve deficit.  Skin: Skin is warm and dry. No rash noted. No erythema.  Psychiatric: She has a normal mood and affect. Her behavior is normal.    ED Course  Procedures  OTHER DATA REVIEWED: Nursing notes, vital signs, and past medical records reviewed.  DIAGNOSTIC STUDIES: Oxygen Saturation is 95% on room air, normal by my interpretation.    Cardiac monitor did show occasional to frequent PVC's, but no complex ventricular ectopy.   LABS / RADIOLOGY:  Results for orders placed during the hospital encounter of 07/13/11  CBC      Component Value Range   WBC 7.9  4.0 - 10.5 (K/uL)   RBC 5.06  3.87 - 5.11 (MIL/uL)   Hemoglobin 15.1 (*) 12.0 - 15.0 (g/dL)   HCT 16.1  09.6 - 04.5 (%)   MCV 86.2  78.0 - 100.0 (fL)   MCH 29.8  26.0 - 34.0 (pg)   MCHC 34.6  30.0 - 36.0 (g/dL)   RDW 40.9  81.1 - 91.4 (%)   Platelets 282  150 - 400 (K/uL)  DIFFERENTIAL      Component Value Range   Neutrophils Relative 63  43 - 77 (%)   Neutro Abs 5.0  1.7 - 7.7 (K/uL)   Lymphocytes Relative 21  12 - 46 (%)   Lymphs Abs 1.6  0.7 - 4.0 (K/uL)   Monocytes Relative 13 (*) 3 - 12 (%)   Monocytes Absolute 1.0  0.1 - 1.0 (K/uL)   Eosinophils Relative 3  0 - 5 (%)   Eosinophils Absolute 0.2  0.0 - 0.7 (K/uL)   Basophils Relative 1  0 - 1 (%)   Basophils Absolute 0.1  0.0 - 0.1 (K/uL)  COMPREHENSIVE METABOLIC PANEL      Component Value Range   Sodium 133 (*) 135 - 145 (mEq/L)   Potassium 4.1  3.5 - 5.1 (mEq/L)   Chloride 97  96 - 112 (mEq/L)   CO2 25  19 - 32 (mEq/L)   Glucose, Bld 102 (*) 70 - 99 (mg/dL)   BUN 7  6 - 23 (mg/dL)   Creatinine, Ser 7.82  0.50 - 1.10 (mg/dL)   Calcium 9.3  8.4 - 95.6 (mg/dL)   Total Protein 6.8  6.0 - 8.3 (g/dL)   Albumin 3.8  3.5 - 5.2 (g/dL)   AST 20  0 - 37 (U/L)   ALT 31  0 - 35 (U/L)   Alkaline Phosphatase 76  39 - 117 (U/L)   Total  Bilirubin 0.3  0.3 -  1.2 (mg/dL)   GFR calc non Af Amer >60  >60 (mL/min)   GFR calc Af Amer >60  >60 (mL/min)  URINALYSIS, ROUTINE W REFLEX MICROSCOPIC      Component Value Range   Color, Urine YELLOW  YELLOW    Appearance CLEAR  CLEAR    Specific Gravity, Urine 1.007  1.005 - 1.030    pH 6.5  5.0 - 8.0    Glucose, UA NEGATIVE  NEGATIVE (mg/dL)   Hgb urine dipstick NEGATIVE  NEGATIVE    Bilirubin Urine NEGATIVE  NEGATIVE    Ketones, ur NEGATIVE  NEGATIVE (mg/dL)   Protein, ur NEGATIVE  NEGATIVE (mg/dL)   Urobilinogen, UA 0.2  0.0 - 1.0 (mg/dL)   Nitrite NEGATIVE  NEGATIVE    Leukocytes, UA NEGATIVE  NEGATIVE   CARDIAC PANEL(CRET KIN+CKTOT+MB+TROPI)      Component Value Range   Total CK 153  7 - 177 (U/L)   CK, MB 5.5 (*) 0.3 - 4.0 (ng/mL)   Troponin I <0.30  <0.30 (ng/mL)   Relative Index 3.6 (*) 0.0 - 2.5       ED COURSE / COORDINATION OF CARE: 18:00 EDMD examined patient and ordered a cardiac workup, a CMP, UA, CBC and differential.   MDM: Palpitations and dizziness, vague chest pain   PLAN:  Transferred to [facility] patient verbally agreed to transfer   CONDITION ON DISCHARGE: Fair   MEDICATIONS GIVEN IN THE E.D. Medications - No data to display   Discussed with Tarry Kos MD, who accepts the patient in transfer.   Scribe Investment banker, operational: I personally performed the services described in this documentation, which was scribed in my presence. The recorded information has been reviewed and considered.     Dione Booze, MD 07/13/11 2034  Dione Booze, MD 08/06/11 2100

## 2011-07-13 NOTE — ED Notes (Signed)
Pt aware of plans for admission. Verbalized understanding. Pt given lean cuisine meal with water to drink.

## 2011-07-13 NOTE — ED Notes (Signed)
Report given to [employer] on 2000. Bed 2033. [facility] here for transport.

## 2011-07-13 NOTE — ED Notes (Signed)
IV was charted "removed" for documentation purposes only. Left hand SL does in fact remain intact at time of transfer

## 2011-07-13 NOTE — ED Notes (Signed)
Date: 07/13/2011  Rate: 65  Rhythm: normal sinus rhythm  QRS Axis: normal  Intervals: normal QRS: anterior Q waves  ST/T Wave abnormalities: T wave inversions in the inferior, lateral and anterolateral leads, possibly secondary to LVH  Conduction Disutrbances:none  Narrative Interpretation: LVH with repolarization abnormalities  Old EKG Reviewed: unchanged    Dione Booze, MD 07/13/11 [phone]

## 2011-07-14 ENCOUNTER — Inpatient Hospital Stay (HOSPITAL_COMMUNITY): Payer: Medicare Other

## 2011-07-14 DIAGNOSIS — R002 Palpitations: Secondary | ICD-10-CM

## 2011-07-14 LAB — COMPREHENSIVE METABOLIC PANEL
ALT: 24 U/L (ref 0–35)
CO2: 28 mEq/L (ref 19–32)
Calcium: 8.8 mg/dL (ref 8.4–10.5)
Creatinine, Ser: 0.66 mg/dL (ref 0.50–1.10)
GFR calc Af Amer: >60 mL/min (ref >60)
GFR calc non Af Amer: >60 mL/min (ref >60)
Glucose, Bld: 103 mg/dL — ABNORMAL HIGH (ref 70–99)

## 2011-07-14 LAB — DIFFERENTIAL
Lymphocytes Relative: 23 % (ref 12–46)
Lymphs Abs: 1.9 10*3/uL (ref 0.7–4.0)
Monocytes Absolute: 0.9 10*3/uL (ref 0.1–1.0)
Monocytes Relative: 10 % (ref 3–12)
Neutro Abs: 5.4 10*3/uL (ref 1.7–7.7)

## 2011-07-14 LAB — CARDIAC PANEL(CRET KIN+CKTOT+MB+TROPI)
CK, MB: 4.5 ng/mL — ABNORMAL HIGH (ref 0.3–4.0)
CK, MB: 5.1 ng/mL — ABNORMAL HIGH (ref 0.3–4.0)
Relative Index: 3.5 — ABNORMAL HIGH (ref 0.0–2.5)
Total CK: 117 U/L (ref 7–177)

## 2011-07-14 LAB — CBC
HCT: 42.6 % (ref 36.0–46.0)
Hemoglobin: 14.9 g/dL (ref 12.0–15.0)
MCH: 30.5 pg (ref 26.0–34.0)
MCHC: 35 g/dL (ref 30.0–36.0)

## 2011-07-14 LAB — TSH: TSH: 2.317 u[IU]/mL (ref 0.350–4.500)

## 2011-07-14 LAB — T4, FREE: Free T4: 1.04 ng/dL (ref 0.80–1.80)

## 2011-07-14 MED ORDER — IOHEXOL 300 MG/ML  SOLN
100.0000 mL | Freq: Once | INTRAMUSCULAR | Status: AC | PRN
  Administered 2011-07-14: 100 mL via INTRAVENOUS

## 2011-07-27 NOTE — Consult Note (Signed)
NAMEBENAY, [name] [name].:  [id number]  MEDICAL RECORD [name].:  [id number]  LOCATION:  2033                         FACILITY:  [facility]  PHYSICIAN:  Duke Salvia, MD, FACCDATE OF BIRTH:  10-16-1935  DATE OF CONSULTATION:  07/14/2011 DATE OF DISCHARGE:  07/14/2011                                CONSULTATION   Laurie [name] was seen at the request of the hospitalist because of palpitations.  She is a 76 year old woman with a history of aortic stenosis, status post bioprosthetic valve replacement in 2009 at which time, she was found also to have nonobstructive coronary disease.  She was also found to have a right mid lung cancer and underwent right middle lobectomy at the same time.  Because of subaortic stenosis, she also underwent septal myomectomy.  She has a history of postoperative atrial fibrillation and nonsustained atrial arrhythmias on Holter monitoring.  She was admitted to the hospital yesterday from the [facility] Emergency Room because of complaints of dizziness.  She had taken her blood pressure at home.  Blood pressures were normal, heart rate however in the 40s.  This persisted for some period of time and so she presented to the emergency room where her D-dimer was noted to be elevated and ventricular ectopy was observed.  She was referred to [facility] for admission where she underwent CT scanning demonstrated [name] evidence of pulmonary embolism.  Telemetry monitoring demonstrated PVCs with a frequency ranging from 0-30 PVCs per hour.  The patient has had [name] complaints of chest pain, shortness of breath, exercise intolerance, or peripheral edema.  Her past medical history in addition to the above is notable for hypertension, hyperlipidemia, peripheral vascular disease with a known right iliac aneurysm.  There is a comment in the old chart referring to prior stroke.  I do not have confirmation of that.  She also has a history of left renal adenoma and  iron-deficiency anemia according to the chart.  MEDICATIONS AT HOME:  Include: 1. Metoprolol 50 b.i.d., which she found to be beneficial as related     to her symptoms mentioned above. 2. Aspirin 81. 3. Lisinopril 40.  ALLERGIES:  Include VIOXX.  SOCIAL HISTORY:  She lives with her son.  She has two children.  She does not use cigarettes, alcohol, or recreational drugs.  FAMILY HISTORY:  Negative for coronary artery disease or valvular disease.  REVIEW OF SYSTEMS:  Broadly negative apart from what was outlined. Previously, her caffeine intake is modest.  She denies fevers, chills.  PHYSICAL EXAMINATION:  GENERAL:  She is an older Caucasian female appearing her stated age of 89.  She is in [name] acute distress. VITAL SIGNS:  Her blood pressure is 120/70, her pulse was 64, and regular telemetry demonstrated some irregularity, respirations were 16 and unlabored. HEENT:  Normal. NECK:  Veins were flat.  Carotids were brisk and full bilaterally without bruits. BACK:  Without kyphosis or scoliosis. LUNGS:  Clear. HEART:  Sounds were regular with an early systolic murmur.  There was a well-healed sternotomy scar. ABDOMEN:  Soft with active bowel sounds without hepatojugular reflux or hepatomegaly. EXTREMITIES:  Femoral pulses were not examined.  Distal pulses were intact.  There is  [name] clubbing, cyanosis, or edema.  NEUROLOGIC:  Grossly normal. SKIN:  Warm and dry.  Electrocardiogram demonstrated ventricular ectopy with a superior axis morphology.  TSH was normal.  Magnesium and potassium were both normal. D-dimer was elevated as noted previously.  Troponins were normal, MB was borderline elevated.  IMPRESSION: 1. Ventricular ectopy with superior axis morphology, responsive at the     least in part to beta-blockers at a density up to bigeminy. 2. Previously normal left ventricular function with prior     bioprosthetic aortic valve replacement, septal myomectomy, and     known  nonobstructive coronary artery disease. 3. Intolerance of statin therapy. 4. Hyperlipidemia. 5. Peripheral vascular disease. 6. Hypertension.  DISCUSSION:  Laurie [name] has a symptomatic ventricular ectopy with functional bradycardia.  As it was somewhat responsive to beta-blockers and density is quite variable, I would recommend that we increase her beta-blockers from 50-75 mg metoprolol b.i.d.  We will see how she does.  I have also suggested to her that she tried low-dose statins taking once or twice a week.  Given her coronary artery disease and vascular disease, even a little bit is better than nothing  We will arrange follow up with Dr. Jens Som, which is done on August 26, 2011 at 9 a.m.  I have asked her to further decrease her caffeine if she can.  Thank you for the consultation.     Duke Salvia, MD, [facility]     SCK/MEDQ  D:  07/14/2011  T:  07/14/2011  Job:  [id number]  Electronically Signed by Sherryl Manges MD [facility] on 07/27/2011 01:53:11 PM

## 2011-08-04 NOTE — H&P (Signed)
[name], [name] NO.:  [id number]  MEDICAL RECORD NO.:  [id number]  LOCATION:  2033                         FACILITY:  [facility]  PHYSICIAN:  Eduard Clos, MDDATE OF BIRTH:  [birth year]  DATE OF ADMISSION:  07/13/2011 DATE OF DISCHARGE:                             HISTORY & PHYSICAL   PRIMARY CARE PHYSICIAN:  Seymour Bars, DO  PRIMARY CARDIOLOGIST:  Madolyn Frieze. Jens Som, MD, [facility]  PRIMARY CARDIOTHORACIC SURGEON:  Salvatore Decent. Cornelius Moras, MD  CHIEF COMPLAINT:  Chest discomfort and palpitations.  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  The patient states that she checked her blood pressure and her heart rate.  She says her blood pressure was normal, but her heart rate was low.  She took her metoprolol which she felt better.  She came to the ER.  In the ER, the patient had cardiac enzymes which showed negative troponin.  EKG was showing heart rate of 65 beat with ST-T changes compatible with LVH findings.  The patient at this time is chest pain free and has been admitted for further observation.  The patient denies any nausea, vomiting, abdominal pain, dysuria, or discharge.  She denies any focal deficit, headache, or visual symptoms.  The patient had a similar episode 2 days ago which also lasted around 2 hours.  At that time, she did not check her blood pressure and heart rate.  The patient denies any fever or chills.  Denies any cough or phlegm. Denies any dysuria or discharges.  PAST  MEDICAL HISTORY: 1. History of severe aortic stenosis and with subvalvular left     ventricular outflow tract obstruction status post aortic valve     replacement with pericardial tissue valve and septal myomectomy. 2. 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  MEDICATIONS PRIOR TO ADMISSION:  The patient on: 1. Metoprolol 50 mg p.o. b.i.d. 2. Lisinopril 40 mg p.o. daily. 3. Aspirin 81 mg p.o. daily.  ALLERGIES:  VIOXX.  SOCIAL HISTORY:  The patient quit smoking 3 years ago.  Denies any alcohol or drug abuse.  Lives with her son.  FAMILY HISTORY:  Nothing contributory.  REVIEW OF SYSTEMS:  As per history presenting illness, nothing else significant.  PHYSICAL EXAMINATION:  GENERAL:  The patient examined at bedside not in acute distress. VITAL SIGNS:  Blood pressure 156/85, pulse is 61 per minute, temperature 97.4, respirations 18 per minute, O2 sat is 96% on room air. HEENT:  Anicteric.  No pallor.  No discharge from ears, eyes, nose, or mouth. CHEST:  Bilateral air entry present.  No rhonchi, no crepitation. HEART:  S1 and S2 heard. ABDOMEN:  Soft, nontender.  Bowel sounds heard. CNS:  Patient alert, awake, and oriented to time, place, and person, moves upper and lower extremities 5/5. EXTREMITIES:  Peripheral pulses felt.  No edema.  LABS:  EKG shows normal sinus rhythm with a heart rate of 165 beats per minute, with ST-T changes compatible with LVH with repolarization abnormality.  I have discussed this EKG with Dr. Katrinka Blazing, cardiologist.  I ordered a chest x-ray, CBC, WBC is 7.9, hemoglobin is 15.1, hematocrit is 43.6, platelets 282, neutrophils 63%.  Complete metabolic panel, sodium 133, potassium 4.1, chloride 97, carbon dioxide 25, glucose 102, BUN  7.6, total bilirubin is 0.3, alkaline phosphatase 76, AST 20, ALT 31, total protein 6.8, albumin 3.8, calcium 9.3.  CK is 123, MB is 5.5, relative index 3.6, troponin I less than 0.3.  UA is negative for nitrites and leukocyte.  ASSESSMENT: 1. Chest pain, rule out acute coronary syndrome. 2. Palpitation, rule out any arrhythmia. 3. History of aortic valve replacement for history of aortic valve     stenosis with pericardial tissue with septal myomectomy for     subvalvular left ventricular outflow tract obstruction. 4. History of right middle lobectomy for moderately differentiated     squamous cell carcinoma of the lung. 5. History of postoperative paroxysmal atrial fibrillation 6. History of hypertension. 7. History of cigarette smoking.  PLAN: 1. At this time, admit patient to telemetry to observe any arrhythmia. 2. For chest pain at this time, the patient is chest pain free.  We     will cycle cardiac markers, continue with aspirin. 3. For palpitations at this time, we will closely observe rhythm and     heart rate in the telemetry.  We will check a D-dimer and also     thyroid function test. 4. We will also get a portable chest x-ray and further plan as     condition evolves and based on test orders.     Eduard Clos, MD     ANK/MEDQ  D:  07/14/2011  T:  07/14/2011  Job:  [id number]  cc:   Seymour Bars, D.O. Madolyn Frieze Jens Som, MD, [facility] Salvatore Decent. Cornelius Moras, M.D.  Electronically Signed by Midge Minium MD on 08/04/2011 09:22:19 AM

## 2011-08-07 DIAGNOSIS — Z902 Acquired absence of lung [part of]: Secondary | ICD-10-CM

## 2011-08-07 DIAGNOSIS — Z952 Presence of prosthetic heart valve: Secondary | ICD-10-CM

## 2011-08-10 ENCOUNTER — Ambulatory Visit (INDEPENDENT_AMBULATORY_CARE_PROVIDER_SITE_OTHER): Payer: Medicare Other | Admitting: Thoracic Surgery (Cardiothoracic Vascular Surgery)

## 2011-08-10 ENCOUNTER — Encounter: Payer: Self-pay | Admitting: Thoracic Surgery (Cardiothoracic Vascular Surgery)

## 2011-08-10 VITALS — BP 122/68 | HR 75 | Resp 20 | Ht 68.0 in | Wt 175.0 lb

## 2011-08-10 DIAGNOSIS — Z954 Presence of other heart-valve replacement: Secondary | ICD-10-CM

## 2011-08-10 DIAGNOSIS — C342 Malignant neoplasm of middle lobe, bronchus or lung: Secondary | ICD-10-CM

## 2011-08-10 DIAGNOSIS — Z9889 Other specified postprocedural states: Secondary | ICD-10-CM

## 2011-08-10 DIAGNOSIS — I359 Nonrheumatic aortic valve disorder, unspecified: Secondary | ICD-10-CM

## 2011-08-10 NOTE — Progress Notes (Signed)
PCP is Seymour Bars, DO, DO Referring Provider is Jens Som Madolyn Frieze, MD  Chief Complaint  Patient presents with  . Follow-up    1 year f/u , CXR and CT scan done on 07/14/2011 @ [facility].    HPI: Patient returns for a followup and surveillance of lung cancer. 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 She was last seen here in the office 08/18/2010. Since then she has remained clinically stable. Last month she was observed in the emergency department at [facility] because of a question of bradycardia. Chest x-ray and chest CT scan were performed at that time. There was no sign of locally recurrent nor distant metastatic disease on these exams.  Past Medical History  Diagnosis Date  . Anemia     iron deficiency  . Shingles 11-09  . COPD (chronic obstructive pulmonary disease) 2005    on CXR with R apical scaring  . Atrial fibrillation     post op  . Hypertension   . Cerebrovascular disease   . S/P aortic valve replacement 08/28/2008    [facility]  . S/P lobectomy of lung 08/28/2008    rt middle lobe  dr. Cornelius Moras  . Carcinoma of lung 08/28/2008    T2N0 moderately diff. squamous cell CA    Past Surgical History  Procedure Date  . Aapy   . Tonsillectomy and adenoidectomy   . U/s guided aspiration of r breast cyst  2009    at the breast clinic  . Rml removed dr Barry Dienes for lung cancer 08/28/2008    T2N0 squamous cell CA  . Cardiac valve replacement   . Abdominal surgery   . Tonsillectomy   . Aortic valve replacement 08/28/2008    #62mm [facility] Ease pericardial tissue valve  . Cardiac valve surgery 08/28/2008    septal myomectomy    Family History  Problem Relation Age of Onset  . Stroke Father   . Diabetes Son     brittle diabetes    Social History History  Substance Use Topics  . Smoking status: Former Smoker  -- 40 years  . Smokeless tobacco: Not on file  . Alcohol Use: No    Current Outpatient Prescriptions  Medication Sig Dispense Refill  . acetaminophen (TYLENOL) 500 MG tablet Take 500 mg by mouth every 6 (six) hours as needed. Pain        . aspirin 81 MG tablet Take 81 mg by mouth daily.        Marland Kitchen lisinopril (PRINIVIL,ZESTRIL) 40 MG tablet Take 40 mg by mouth daily.       . metoprolol (LOPRESSOR) 50 MG tablet TAKE 1 TABLET BY MOUTH TWO TIMES A DAY  180 tablet  3    Allergies  Allergen Reactions  . Statins     Muscle aches  . Rofecoxib Rash    Review of Systems: The patient reports stable appetite. She has been having difficulty trying to lose weight. She has stable symptoms of shortness of breath which seem to wax and wane. She denies productive cough, hemoptysis, or wheezing. She has no new aches or pains it might be suspicious of bony metastases. She has not had problems with headaches or visual disturbances. She seems to be quite stable from a cardiovascular standpoint. The remainder of her review of systems unremarkable.  BP 122/68  Pulse 75  Resp 20  Ht 5\' 8"  (1.727 m)  Wt 175 lb (79.379 kg)  BMI 26.61 kg/m2  SpO2 94% Physical Exam: On physical exam the patient looks quite good. There is no palpable cervical nor supraclavicular lymphadenopathy. Auscultation of the chest reveals clear breath sounds which are symmetrical bilaterally. A vascular exam is regular rate and rhythm. There is no bony tenderness on palpation of the thoracic rib cage her spine. The abdomen is soft nontender. There are no palpable masses. Extremities are warm and well-perfused. There is no bony tenderness on palpation of the arms or legs. Neurologic examination is grossly nonfocal.  Diagnostic Tests: Chest CT scan performed 07/14/2011 was reviewed. This demonstrates stable postoperative changes on the right side. There are no new parenchymal lesions in other lung. There is no significant lymphadenopathy.  There is no sign of locally recurrent nor distant metastatic disease.  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  Plan: The patient will return in one year's time for followup chest x-ray.

## 2011-08-14 ENCOUNTER — Telehealth: Payer: Self-pay | Admitting: Family Medicine

## 2011-08-14 MED ORDER — ALPRAZOLAM 0.5 MG PO TABS: 0.5000 mg | ORAL_TABLET | Freq: Three times a day (TID) | ORAL | Status: DC | PRN

## 2011-08-14 NOTE — Telephone Encounter (Signed)
Closed

## 2011-08-25 ENCOUNTER — Encounter: Payer: Self-pay | Admitting: Cardiology

## 2011-08-26 ENCOUNTER — Encounter: Payer: Self-pay | Admitting: Cardiology

## 2011-08-26 ENCOUNTER — Ambulatory Visit (INDEPENDENT_AMBULATORY_CARE_PROVIDER_SITE_OTHER): Payer: Medicare Other | Admitting: Cardiology

## 2011-08-26 ENCOUNTER — Ambulatory Visit: Payer: Medicare Other | Admitting: Cardiology

## 2011-08-26 DIAGNOSIS — I714 Abdominal aortic aneurysm, without rupture, unspecified: Secondary | ICD-10-CM | POA: Insufficient documentation

## 2011-08-26 DIAGNOSIS — E785 Hyperlipidemia, unspecified: Secondary | ICD-10-CM

## 2011-08-26 DIAGNOSIS — I359 Nonrheumatic aortic valve disorder, unspecified: Secondary | ICD-10-CM

## 2011-08-26 DIAGNOSIS — I723 Aneurysm of iliac artery: Secondary | ICD-10-CM

## 2011-08-26 DIAGNOSIS — I2510 Atherosclerotic heart disease of native coronary artery without angina pectoris: Secondary | ICD-10-CM

## 2011-08-26 DIAGNOSIS — I6529 Occlusion and stenosis of unspecified carotid artery: Secondary | ICD-10-CM

## 2011-08-26 NOTE — Assessment & Plan Note (Signed)
Blood pressure controlled. Continue present medications. 

## 2011-08-26 NOTE — Assessment & Plan Note (Signed)
followup ultrasound April 2013.

## 2011-08-26 NOTE — Assessment & Plan Note (Signed)
Continue aspirin. Followup carotid Dopplers.

## 2011-08-26 NOTE — Patient Instructions (Signed)
Your physician wants you to follow-up in: ONE YEAR You will receive a reminder letter in the mail two months in advance. If you don't receive a letter, please call our office to schedule the follow-up appointment.   Your physician has requested that you have a carotid duplex. This test is an ultrasound of the carotid arteries in your neck. It looks at blood flow through these arteries that supply the brain with blood. Allow one hour for this exam. There are no restrictions or special instructions.AT [facility] IN [place]

## 2011-08-26 NOTE — Progress Notes (Signed)
HPI: Laurie [name] is a pleasant female who has a history of aortic stenosis status post aortic valve replacement in September 2009.  She had a pericardial tissue valve.  She also had a septal myectomy and a right middle lobectomy due to lung CA.  Note, she has a 50% mid LAD by catheterization preoperatively.  She also has a 16-mm aneurysmal dilatation of the right common iliac. Note she had followup CTA of her iliacs in January of 2011. There was moderate stenosis of the right iliac with mild poststenotic dilatation. Abdominal CT in April of 2012 showed a 3.2 cm abdominal aortic aneurysm. Followup carotid Dopplers on Apr 28, 2010 performed in [facility] revealed a 40-59% left and 1-39% right stenosis. An echocardiogram on Apr 26, 2010 showed normal LV function, no aortic stenosis, no aortic insufficiency and mild tricuspid regurgitation. I last saw her in Dec of 2011. Since then the patient denies any dyspnea on exertion, orthopnea, PND, pedal edema, palpitations, syncope or chest pain.  Current Outpatient Prescriptions  Medication Sig Dispense Refill  . acetaminophen (TYLENOL) 500 MG tablet Take 500 mg by mouth every 6 (six) hours as needed. Pain        . aspirin 81 MG tablet Take 81 mg by mouth daily.        Marland Kitchen lisinopril (PRINIVIL,ZESTRIL) 40 MG tablet Take 40 mg by mouth daily.       . metoprolol (LOPRESSOR) 50 MG tablet TAKE 1 TABLET BY MOUTH TWO TIMES A DAY  180 tablet  3     Past Medical History  Diagnosis Date  . Anemia     iron deficiency  . Shingles 11-09  . COPD (chronic obstructive pulmonary disease) 2005    on CXR with R apical scaring  . Atrial fibrillation     post op  . Hypertension   . Cerebrovascular disease   . S/P aortic valve replacement 08/28/2008    [facility]  . S/P lobectomy of lung 08/28/2008    rt middle lobe  dr. Cornelius Moras  . Carcinoma of lung 08/28/2008    T2N0 moderately diff. squamous cell CA  . AAA (abdominal aortic aneurysm)   . CAD (coronary artery disease)      Past Surgical History  Procedure Date  . Aapy   . Tonsillectomy and adenoidectomy   . U/s guided aspiration of r breast cyst  2009    [facility] clinic  . Rml removed dr Barry Dienes for lung cancer 08/28/2008    T2N0 squamous cell CA  . Cardiac valve replacement   . Abdominal surgery   . Tonsillectomy   . Aortic valve replacement 08/28/2008    #42mm [facility] Ease pericardial tissue valve  . Cardiac valve surgery 08/28/2008    septal myomectomy    History   Social History  . Marital Status: Single    Spouse Name: N/A    Number of Children: N/A  . Years of Education: N/A   Occupational History  . Not on file.   Social History Main Topics  . Smoking status: Former Smoker -- 40 years  . Smokeless tobacco: Not on file  . Alcohol Use: No  . Drug Use: No  . Sexually Active:    Other Topics Concern  . Not on file   Social History Narrative  . No narrative on file    ROS: no fevers or chills, productive cough, hemoptysis, dysphasia, odynophagia, melena, hematochezia, dysuria, hematuria, rash, seizure activity, orthopnea, PND, pedal edema, claudication. Remaining systems  are negative.  Physical Exam: Well-developed well-nourished in no acute distress.  Skin is warm and dry.  HEENT is normal.  Neck is supple. No thyromegaly.  Chest is mild expiratory wheeze Cardiovascular exam is regular rate and rhythm. 2/6 soft murmur left sternal border. No diastolic murmur. Abdominal exam nontender or distended. No masses palpated. Extremities show no edema. neuro grossly intact  ECG normal sinus rhythm at a rate of 72. Axis normal. Left ventricular hypertrophy with repolarization abnormality.

## 2011-08-26 NOTE — Assessment & Plan Note (Signed)
Continue aspirin. Intolerant to statins. 

## 2011-08-26 NOTE — Assessment & Plan Note (Signed)
Scheduled followup abdominal ultrasound in April 2013.

## 2011-08-26 NOTE — Assessment & Plan Note (Signed)
Intolerant to statins. Continue diet. 

## 2011-08-26 NOTE — Assessment & Plan Note (Signed)
Continue SBE prophylaxis. Followup echocardiogram one year

## 2011-08-31 LAB — CBC
HCT: 29.5 — ABNORMAL LOW
HCT: 24.1 — ABNORMAL LOW
HCT: 24.3 — ABNORMAL LOW
HCT: 35.3 — ABNORMAL LOW
Hemoglobin: 8.6 — ABNORMAL LOW
Hemoglobin: 8.8 — ABNORMAL LOW
Hemoglobin: 9.9 — ABNORMAL LOW
Hemoglobin: 11.9 — ABNORMAL LOW
Hemoglobin: 8.4 — ABNORMAL LOW
MCHC: 33.4
MCHC: 33.6
MCHC: 33.9
MCHC: 34.4
MCHC: 33.6
MCHC: 33.8
MCHC: 33.9
MCHC: 34.6
MCV: 87.2
MCV: 87.5
MCV: 87.5
MCV: 87.7
MCV: 87.2
MCV: 87.5
MCV: 87.5
MCV: 88.9
MCV: 89.2
MCV: 89.3
Platelets: 115 — ABNORMAL LOW
Platelets: 108 — ABNORMAL LOW
Platelets: 376
Platelets: 378
RBC: 2.94 — ABNORMAL LOW
RBC: 2.95 — ABNORMAL LOW
RBC: 3.01 — ABNORMAL LOW
RBC: 3.37 — ABNORMAL LOW
RBC: 4.77
RBC: 2.72 — ABNORMAL LOW
RBC: 2.86 — ABNORMAL LOW
RBC: 4.16
RDW: 14.6
RDW: 14.7
RDW: 14.8
RDW: 15.2
RDW: 16.9 — ABNORMAL HIGH
WBC: 18.3 — ABNORMAL HIGH
WBC: 18.6 — ABNORMAL HIGH
WBC: 13.1 — ABNORMAL HIGH
WBC: 13.4 — ABNORMAL HIGH
WBC: 8.9

## 2011-08-31 LAB — COMPREHENSIVE METABOLIC PANEL
AST: 24
CO2: 32
Calcium: 9.5
Creatinine, Ser: 0.87
GFR calc Af Amer: >60
GFR calc non Af Amer: >60

## 2011-08-31 LAB — URINALYSIS, ROUTINE W REFLEX MICROSCOPIC
Bilirubin Urine: NEGATIVE
Bilirubin Urine: NEGATIVE
Glucose, UA: NEGATIVE
Hgb urine dipstick: NEGATIVE
Hgb urine dipstick: NEGATIVE
Ketones, ur: NEGATIVE
Ketones, ur: NEGATIVE
Nitrite: NEGATIVE
Protein, ur: NEGATIVE
Protein, ur: NEGATIVE
Specific Gravity, Urine: 1.009
Urobilinogen, UA: 0.2
Urobilinogen, UA: 1
pH: 7

## 2011-08-31 LAB — BASIC METABOLIC PANEL
BUN: 8
BUN: 20
BUN: 7
CO2: 24
CO2: 24
CO2: 30
CO2: 30
Calcium: 7.6 — ABNORMAL LOW
Calcium: 8.2 — ABNORMAL LOW
Chloride: 108
Chloride: 103
Chloride: 104
Chloride: 91 — ABNORMAL LOW
Chloride: 92 — ABNORMAL LOW
Chloride: 96
Creatinine, Ser: 0.71
Creatinine, Ser: 0.55
Creatinine, Ser: 0.67
GFR calc Af Amer: >60
GFR calc Af Amer: >60
GFR calc non Af Amer: >60
GFR calc non Af Amer: >60
Glucose, Bld: 173 — ABNORMAL HIGH
Glucose, Bld: 111 — ABNORMAL HIGH
Glucose, Bld: 116 — ABNORMAL HIGH
Glucose, Bld: 138 — ABNORMAL HIGH
Potassium: 3.6
Potassium: 4
Potassium: 4.2
Sodium: 128 — ABNORMAL LOW
Sodium: 135

## 2011-08-31 LAB — POCT I-STAT 3, ART BLOOD GAS (G3+)
Acid-base deficit: 1
Acid-base deficit: 1
Acid-base deficit: 3 — ABNORMAL HIGH
Acid-base deficit: 3 — ABNORMAL HIGH
Bicarbonate: 23.1
Bicarbonate: 25.4 — ABNORMAL HIGH
O2 Saturation: 100
O2 Saturation: 98
Patient temperature: 36
TCO2: 26
TCO2: 27
pCO2 arterial: 46.9 — ABNORMAL HIGH
pH, Arterial: 7.311 — ABNORMAL LOW
pH, Arterial: 7.356
pO2, Arterial: 117 — ABNORMAL HIGH
pO2, Arterial: 499 — ABNORMAL HIGH

## 2011-08-31 LAB — GLUCOSE, CAPILLARY
Glucose-Capillary: 106 — ABNORMAL HIGH
Glucose-Capillary: 138 — ABNORMAL HIGH
Glucose-Capillary: 152 — ABNORMAL HIGH
Glucose-Capillary: 86
Glucose-Capillary: 91
Glucose-Capillary: 130 — ABNORMAL HIGH

## 2011-08-31 LAB — MAGNESIUM
Magnesium: 2.3
Magnesium: 2.4

## 2011-08-31 LAB — POCT I-STAT 4, (NA,K, GLUC, HGB,HCT)
Glucose, Bld: 113 — ABNORMAL HIGH
Glucose, Bld: 118 — ABNORMAL HIGH
Glucose, Bld: 120 — ABNORMAL HIGH
Glucose, Bld: 121 — ABNORMAL HIGH
Glucose, Bld: 129 — ABNORMAL HIGH
HCT: 30 — ABNORMAL LOW
HCT: 32 — ABNORMAL LOW
Hemoglobin: 10.2 — ABNORMAL LOW
Hemoglobin: 6.5 — CL
Hemoglobin: 7.5 — CL
Hemoglobin: 7.8 — CL
Potassium: 3.4 — ABNORMAL LOW
Potassium: 3.5
Potassium: 4.2
Sodium: 135
Sodium: 137
Sodium: 141

## 2011-08-31 LAB — BLOOD GAS, ARTERIAL
Acid-Base Excess: 3.7 — ABNORMAL HIGH
Bicarbonate: 27.7 — ABNORMAL HIGH
O2 Saturation: 93.8
Patient temperature: 98.6
TCO2: 29

## 2011-08-31 LAB — POCT I-STAT, CHEM 8
BUN: 14
Calcium, Ion: 1.06 — ABNORMAL LOW
Creatinine, Ser: 0.7
Glucose, Bld: 173 — ABNORMAL HIGH
HCT: 23 — ABNORMAL LOW
Hemoglobin: 7.8 — CL
Hemoglobin: 9.2 — ABNORMAL LOW
Potassium: 4.2
Sodium: 140
TCO2: 24
TCO2: 24

## 2011-08-31 LAB — POCT I-STAT GLUCOSE
Glucose, Bld: 131 — ABNORMAL HIGH
Operator id: 125961
Operator id: 3406

## 2011-08-31 LAB — HEMOGLOBIN A1C: Hgb A1c MFr Bld: 5.6

## 2011-08-31 LAB — TYPE AND SCREEN

## 2011-08-31 LAB — URINE MICROSCOPIC-ADD ON

## 2011-08-31 LAB — CREATININE, SERUM
Creatinine, Ser: 0.64
GFR calc Af Amer: >60
GFR calc Af Amer: >60
GFR calc non Af Amer: >60

## 2011-08-31 LAB — URINE CULTURE
Colony Count: >=100000
Special Requests: NEGATIVE

## 2011-08-31 LAB — PROTIME-INR
INR: 1
Prothrombin Time: 12.8

## 2011-08-31 LAB — CULTURE, BLOOD (ROUTINE X 2)
Culture: NO GROWTH
Culture: NO GROWTH

## 2011-08-31 LAB — HEMOGLOBIN AND HEMATOCRIT, BLOOD: HCT: 23.1 — ABNORMAL LOW

## 2011-08-31 LAB — TSH: TSH: 2.272

## 2011-09-01 LAB — URINALYSIS, ROUTINE W REFLEX MICROSCOPIC
Glucose, UA: NEGATIVE
Nitrite: NEGATIVE
Specific Gravity, Urine: 1.024
pH: 6.5

## 2011-09-01 LAB — DIFFERENTIAL
Basophils Absolute: 0.2 — ABNORMAL HIGH
Eosinophils Absolute: 0.7
Eosinophils Relative: 5
Lymphocytes Relative: 7 — ABNORMAL LOW
Neutrophils Relative %: 77

## 2011-09-01 LAB — BASIC METABOLIC PANEL
BUN: 13
Creatinine, Ser: 0.9
GFR calc non Af Amer: >60
Glucose, Bld: 111 — ABNORMAL HIGH
Potassium: 4.3

## 2011-09-01 LAB — CBC
HCT: 40.7
Platelets: 464 — ABNORMAL HIGH
RDW: 16.1 — ABNORMAL HIGH
WBC: 14.4 — ABNORMAL HIGH

## 2011-09-01 LAB — URINE MICROSCOPIC-ADD ON

## 2011-09-01 LAB — CULTURE, BLOOD (ROUTINE X 2)

## 2011-09-01 LAB — POCT CARDIAC MARKERS
CKMB, poc: 2.5
Myoglobin, poc: 73
Troponin i, poc: <0.05

## 2011-09-02 LAB — CBC
Hemoglobin: 14.1
Platelets: 299
RDW: 14.6
WBC: 7.6

## 2011-09-02 LAB — POCT I-STAT 3, VENOUS BLOOD GAS (G3P V)
Bicarbonate: 26 — ABNORMAL HIGH
pCO2, Ven: 46.2
pCO2, Ven: 47.4
pH, Ven: 7.322 — ABNORMAL HIGH
pH, Ven: 7.359 — ABNORMAL HIGH
pO2, Ven: 35

## 2011-09-02 LAB — BLOOD GAS, ARTERIAL
Acid-Base Excess: 1
O2 Saturation: 95.5
pO2, Arterial: 75.9 — ABNORMAL LOW

## 2011-09-02 LAB — URINALYSIS, ROUTINE W REFLEX MICROSCOPIC
Nitrite: NEGATIVE
Specific Gravity, Urine: 1.006
pH: 7.5

## 2011-09-02 LAB — POCT I-STAT 3, ART BLOOD GAS (G3+)
Bicarbonate: 24.6 — ABNORMAL HIGH
TCO2: 26
pH, Arterial: 7.379

## 2011-09-02 LAB — APTT: aPTT: 38 — ABNORMAL HIGH

## 2011-09-02 LAB — COMPREHENSIVE METABOLIC PANEL
ALT: 26
Albumin: 3.9
Alkaline Phosphatase: 63
Chloride: 102
Glucose, Bld: 101 — ABNORMAL HIGH
Potassium: 4.5
Sodium: 133 — ABNORMAL LOW
Total Protein: 6.3

## 2011-09-02 LAB — TYPE AND SCREEN
ABO/RH(D): O POS
Antibody Screen: NEGATIVE

## 2011-09-08 NOTE — Discharge Summary (Signed)
  Laurie [name], PERIN NO.:  [id number]  MEDICAL RECORD NO.:  [id number]  LOCATION:  2033                         FACILITY:  [facility]  PHYSICIAN:  Baltazar Najjar, MD     DATE OF BIRTH:  Apr 11, 1935  DATE OF ADMISSION:  07/13/2011 DATE OF DISCHARGE:                              DISCHARGE SUMMARY   ADDENDUM Please refer to H and P for more details.  FINAL DISCHARGE DIAGNOSES: 1. Chest discomfort with negative cardiac enzymes. 2. Palpitations/PVCs.  SECONDARY DISCHARGE DIAGNOSES: 1. History of aortic valve replacement. 2. History of right middle lobectomy for moderately differentiated     squamous cell carcinoma of the lung. 3. History of postoperative paroxysmal atrial fibrillation. 4. History of hypertension. 5. History of cigarette smoking.  IMAGING STUDIES: 1. The patient had a CT angiogram of the chest done that showed no     evidence of significant PE.  Postoperative changes with scarring     and pleural thickening on the right. 2. Chest x-ray showed cardiac enlargement with prominent pulmonary     vascularity and interstitial changes suggesting edema.  Possible     small right pleural effusion.  Finding may represent congestive     heart failure.  CONSULTATION:  Dr. Sherryl Manges with Cardiology Service.  HOSPITAL COURSE:  Please see H and P for more details.  The patient was seen by Dr. Sherryl Manges from [facility] who recommended to increase her metoprolol to 75 mg p.o. b.i.d. and for her to follow with Dr. Olga Millers, he will arrange for her appointment. Mildly elevated D-dimer, CT angiogram showed no significant PE.  The patient seen and examined by me today.  She had no chest pain, no shortness of breath.  Chart reviewed.  Vital signs blood pressure 120/70, heart rate of 64, temperature 97.4, 02, O2 sat 97% on room air. Her telemetry showed frequent PVCs, which was reviewed by Cardiology. The patient is stable for discharge to home  to follow with her cardiologist and her PCP.  DISCHARGE MEDICATIONS: 1. Metoprolol 75 mg p.o. b.i.d. 2. Aspirin 81 mg p.o. daily. 3. Lisinopril 40 mg p.o. daily. 4. Tylenol Extra Strength 500 mg p.o. q.6 h p.r.n.  DISCHARGE INSTRUCTIONS: 1. The patient to continue above medications as prescribed. 2. The patient to follow with Dr. Olga Millers and Dr. Seymour Bars     as scheduled. 3. The patient to report any worsening of symptoms or any new symptoms     to PCP or come to the ER if needed.  CONDITION ON DISCHARGE:  Stable/improved.          ______________________________ Baltazar Najjar, MD     SA/MEDQ  D:  07/14/2011  T:  07/14/2011  Job:  [id number]  cc:   Seymour Bars, D.O. Madolyn Frieze Jens Som, MD, [facility]. Cornelius Moras, M.D.  Electronically Signed by Hannah Beat MD on 09/08/2011 11:44:55 AM

## 2011-09-10 ENCOUNTER — Encounter (INDEPENDENT_AMBULATORY_CARE_PROVIDER_SITE_OTHER): Payer: Medicare Other | Admitting: Cardiology

## 2011-09-10 DIAGNOSIS — I2510 Atherosclerotic heart disease of native coronary artery without angina pectoris: Secondary | ICD-10-CM

## 2011-09-10 DIAGNOSIS — I6529 Occlusion and stenosis of unspecified carotid artery: Secondary | ICD-10-CM

## 2011-09-10 DIAGNOSIS — I714 Abdominal aortic aneurysm, without rupture, unspecified: Secondary | ICD-10-CM

## 2011-09-10 DIAGNOSIS — I359 Nonrheumatic aortic valve disorder, unspecified: Secondary | ICD-10-CM

## 2011-09-10 DIAGNOSIS — E785 Hyperlipidemia, unspecified: Secondary | ICD-10-CM

## 2011-09-10 DIAGNOSIS — I723 Aneurysm of iliac artery: Secondary | ICD-10-CM

## 2011-10-21 ENCOUNTER — Other Ambulatory Visit: Payer: Self-pay | Admitting: *Deleted

## 2011-10-21 MED ORDER — ALPRAZOLAM 0.5 MG PO TABS: 0.5000 mg | ORAL_TABLET | Freq: Three times a day (TID) | ORAL | Status: DC | PRN

## 2012-01-19 ENCOUNTER — Ambulatory Visit
Admission: RE | Admit: 2012-01-19 | Discharge: 2012-01-19 | Disposition: A | Discharge status: Home / Self Care | Payer: Medicare Other | Source: Ambulatory Visit | Attending: Family Medicine | Admitting: Family Medicine

## 2012-01-19 ENCOUNTER — Ambulatory Visit (INDEPENDENT_AMBULATORY_CARE_PROVIDER_SITE_OTHER): Payer: Medicare Other | Admitting: Family Medicine

## 2012-01-19 ENCOUNTER — Encounter: Payer: Self-pay | Admitting: Family Medicine

## 2012-01-19 VITALS — BP 133/84 | HR 76 | Wt 185.0 lb

## 2012-01-19 DIAGNOSIS — F419 Anxiety disorder, unspecified: Secondary | ICD-10-CM

## 2012-01-19 DIAGNOSIS — R1011 Right upper quadrant pain: Secondary | ICD-10-CM

## 2012-01-19 DIAGNOSIS — F411 Generalized anxiety disorder: Secondary | ICD-10-CM

## 2012-01-19 MED ORDER — ALPRAZOLAM 0.5 MG PO TABS: 0.5000 mg | ORAL_TABLET | Freq: Two times a day (BID) | ORAL | Status: DC | PRN

## 2012-01-19 NOTE — Progress Notes (Signed)
  Subjective:    Patient ID: Laurie [name], female    DOB: [birth year], 77 y.o.   MRN: [id number]  HPI Feels a pressure senstation pushing up on her right lower ribs for about a month but has gotten worse. Radiates into the right lower stomach. Says feels like a softball tucked under her rib. Had a chest CT on August when went to ED for low hR.  Occ feels SOB with it.  No nausea.  Chronic constipation but stable, no recent changes. Still has gallbladder.  Pain is constant 8/10.  No GERD recently. Says stomach will hurst after she eats breakfast. Feels like her stomach is having contractions. Had similar sxs last fall.  She reports she has seen GI for this in the past and had a negative workup.  She would like me to refill her xanax as well. Sas she only uses it prn.    Review of Systems     Objective:   Physical Exam  Constitutional: She is oriented to person, place, and time. She appears well-developed and well-nourished.  HENT:  Head: Normocephalic and atraumatic.  Eyes: Conjunctivae are normal. Pupils are equal, round, and reactive to light.  Cardiovascular: Normal rate, regular rhythm and normal heart sounds.   Pulmonary/Chest: Effort normal and breath sounds normal.  Abdominal: Soft. Bowel sounds are normal. She exhibits no distension and no mass. There is tenderness. There is no rebound and no guarding.       Tender RUQ pain.    Neurological: She is alert and oriented to person, place, and time.  Skin: Skin is warm and dry.  Psychiatric: She has a normal mood and affect. Her behavior is normal.          Assessment & Plan:  RUQ Pain - Will schedule RUQ [place] to eval her GB.  Will check liver enzymes today.  Will order CXR as well with her prior history of lung cancer. Also consider this could be related to scar tissue since she has had similar pain in the past this has resolved on its own. She does have a history of chronic constipation but says it has not changed recently.  Encouraged to continue with her increased fiber and vegetables as this does seem to help.  Encouraged her to get the shingles vaccine. She is very hesistant to get it.  She is up to date on her flu vaccine this year.   Anxiety-I did refill her Xanax today.

## 2012-01-20 ENCOUNTER — Ambulatory Visit
Admission: RE | Admit: 2012-01-20 | Discharge: 2012-01-20 | Disposition: A | Discharge status: Home / Self Care | Payer: Medicare Other | Source: Ambulatory Visit | Attending: Family Medicine | Admitting: Family Medicine

## 2012-01-20 DIAGNOSIS — R1011 Right upper quadrant pain: Secondary | ICD-10-CM

## 2012-01-20 LAB — COMPLETE METABOLIC PANEL WITH GFR
AST: 17 U/L (ref 0–37)
Alkaline Phosphatase: 71 U/L (ref 39–117)
BUN: 9 mg/dL (ref 6–23)
Calcium: 9.3 mg/dL (ref 8.4–10.5)
Chloride: 99 mEq/L (ref 96–112)
Creat: 0.75 mg/dL (ref 0.50–1.10)

## 2012-03-01 ENCOUNTER — Other Ambulatory Visit: Payer: Self-pay | Admitting: Cardiology

## 2012-03-04 ENCOUNTER — Other Ambulatory Visit: Payer: Self-pay | Admitting: *Deleted

## 2012-03-04 MED ORDER — ALPRAZOLAM 0.5 MG PO TABS: 0.5000 mg | ORAL_TABLET | Freq: Two times a day (BID) | ORAL | Status: DC | PRN

## 2012-03-30 ENCOUNTER — Other Ambulatory Visit: Payer: Self-pay | Admitting: Cardiology

## 2012-04-26 ENCOUNTER — Other Ambulatory Visit: Payer: Self-pay | Admitting: *Deleted

## 2012-04-26 MED ORDER — ALPRAZOLAM 0.5 MG PO TABS: 0.5000 mg | ORAL_TABLET | Freq: Two times a day (BID) | ORAL | Status: DC | PRN

## 2012-05-09 ENCOUNTER — Telehealth: Payer: Self-pay | Admitting: Cardiology

## 2012-05-09 DIAGNOSIS — I714 Abdominal aortic aneurysm, without rupture: Secondary | ICD-10-CM

## 2012-05-09 DIAGNOSIS — R109 Unspecified abdominal pain: Secondary | ICD-10-CM

## 2012-05-09 DIAGNOSIS — I2510 Atherosclerotic heart disease of native coronary artery without angina pectoris: Secondary | ICD-10-CM

## 2012-05-09 DIAGNOSIS — Z01810 Encounter for preprocedural cardiovascular examination: Secondary | ICD-10-CM

## 2012-05-09 NOTE — Telephone Encounter (Signed)
New msg Pt wants to talk to you about some tests she needs done

## 2012-05-09 NOTE — Telephone Encounter (Signed)
Spoke with pt, it is time for her to have a CTA to look at her aneurysm. She has been having a lot of abdominal discomfort and her PCP wanted her to have a ct of the gallbladder and the pt wanted to get those scheduled together if possible. Will talk with radiology and call the pt back.

## 2012-05-10 NOTE — Telephone Encounter (Signed)
Spoke with pt, according to radiology the pt can have both test done the same day. The pt has not had labs work recently. [facility] and then we will schedule CTA.

## 2012-05-11 ENCOUNTER — Other Ambulatory Visit: Payer: Self-pay | Admitting: Cardiology

## 2012-05-12 LAB — BASIC METABOLIC PANEL WITH GFR
GFR, Est African American: >89 mL/min
Glucose, Bld: 113 mg/dL — ABNORMAL HIGH (ref 70–99)
Potassium: 4.5 mEq/L (ref 3.5–5.3)
Sodium: 135 mEq/L (ref 135–145)

## 2012-05-12 NOTE — Telephone Encounter (Signed)
Spoke with pt, aware bmp normal. Unable to schedule CT scans person at GI not available. Will have to call back.

## 2012-05-17 NOTE — Telephone Encounter (Signed)
Spoke with pt, aware CTA of abdomen and CT of abdomen will be Friday 05-20-12 @ 10am. [facility]. She will go by and pick up the contrast and instructions.

## 2012-05-19 ENCOUNTER — Ambulatory Visit (HOSPITAL_BASED_OUTPATIENT_CLINIC_OR_DEPARTMENT_OTHER)
Admission: RE | Admit: 2012-05-19 | Discharge: 2012-05-19 | Disposition: A | Discharge status: Home / Self Care | Payer: Medicare Other | Source: Ambulatory Visit | Attending: Cardiology | Admitting: Cardiology

## 2012-05-19 DIAGNOSIS — E279 Disorder of adrenal gland, unspecified: Secondary | ICD-10-CM | POA: Insufficient documentation

## 2012-05-19 DIAGNOSIS — R911 Solitary pulmonary nodule: Secondary | ICD-10-CM | POA: Insufficient documentation

## 2012-05-19 DIAGNOSIS — I714 Abdominal aortic aneurysm, without rupture, unspecified: Secondary | ICD-10-CM | POA: Insufficient documentation

## 2012-05-19 DIAGNOSIS — Z85118 Personal history of other malignant neoplasm of bronchus and lung: Secondary | ICD-10-CM | POA: Insufficient documentation

## 2012-05-19 DIAGNOSIS — K838 Other specified diseases of biliary tract: Secondary | ICD-10-CM | POA: Insufficient documentation

## 2012-05-19 DIAGNOSIS — I77810 Thoracic aortic ectasia: Secondary | ICD-10-CM | POA: Insufficient documentation

## 2012-05-19 DIAGNOSIS — I708 Atherosclerosis of other arteries: Secondary | ICD-10-CM | POA: Insufficient documentation

## 2012-05-19 DIAGNOSIS — J984 Other disorders of lung: Secondary | ICD-10-CM | POA: Insufficient documentation

## 2012-05-19 MED ORDER — IOHEXOL 350 MG/ML SOLN
100.0000 mL | Freq: Once | INTRAVENOUS | Status: AC | PRN
  Administered 2012-05-19: 100 mL via INTRAVENOUS

## 2012-05-20 ENCOUNTER — Ambulatory Visit (HOSPITAL_BASED_OUTPATIENT_CLINIC_OR_DEPARTMENT_OTHER)
Admission: RE | Admit: 2012-05-20 | Discharge: 2012-05-20 | Disposition: A | Discharge status: Home / Self Care | Payer: Medicare Other | Source: Ambulatory Visit | Attending: Cardiology | Admitting: Cardiology

## 2012-05-20 ENCOUNTER — Other Ambulatory Visit (HOSPITAL_BASED_OUTPATIENT_CLINIC_OR_DEPARTMENT_OTHER): Payer: Medicare Other

## 2012-05-20 DIAGNOSIS — R109 Unspecified abdominal pain: Secondary | ICD-10-CM | POA: Insufficient documentation

## 2012-05-20 DIAGNOSIS — R911 Solitary pulmonary nodule: Secondary | ICD-10-CM | POA: Insufficient documentation

## 2012-05-20 DIAGNOSIS — K449 Diaphragmatic hernia without obstruction or gangrene: Secondary | ICD-10-CM | POA: Insufficient documentation

## 2012-05-20 DIAGNOSIS — K7689 Other specified diseases of liver: Secondary | ICD-10-CM | POA: Insufficient documentation

## 2012-05-20 DIAGNOSIS — K5730 Diverticulosis of large intestine without perforation or abscess without bleeding: Secondary | ICD-10-CM | POA: Insufficient documentation

## 2012-05-20 MED ORDER — IOHEXOL 300 MG/ML  SOLN: 100.0000 mL | Freq: Once | INTRAMUSCULAR | Status: AC | PRN

## 2012-06-06 ENCOUNTER — Other Ambulatory Visit: Payer: Self-pay | Admitting: Cardiology

## 2012-06-14 ENCOUNTER — Other Ambulatory Visit: Payer: Self-pay | Admitting: *Deleted

## 2012-06-14 MED ORDER — ALPRAZOLAM 0.5 MG PO TABS: 0.5000 mg | ORAL_TABLET | Freq: Two times a day (BID) | ORAL | Status: DC | PRN

## 2012-07-26 ENCOUNTER — Other Ambulatory Visit: Payer: Self-pay | Admitting: *Deleted

## 2012-07-26 MED ORDER — ALPRAZOLAM 0.5 MG PO TABS: 0.5000 mg | ORAL_TABLET | Freq: Two times a day (BID) | ORAL | Status: DC | PRN

## 2012-08-03 ENCOUNTER — Encounter: Payer: Self-pay | Admitting: Cardiology

## 2012-08-03 ENCOUNTER — Ambulatory Visit (INDEPENDENT_AMBULATORY_CARE_PROVIDER_SITE_OTHER): Payer: Medicare Other | Admitting: Cardiology

## 2012-08-03 VITALS — BP 149/85 | HR 74 | Ht 67.0 in | Wt 182.0 lb

## 2012-08-03 DIAGNOSIS — E785 Hyperlipidemia, unspecified: Secondary | ICD-10-CM

## 2012-08-03 DIAGNOSIS — I714 Abdominal aortic aneurysm, without rupture: Secondary | ICD-10-CM

## 2012-08-03 DIAGNOSIS — I10 Essential (primary) hypertension: Secondary | ICD-10-CM

## 2012-08-03 DIAGNOSIS — I2510 Atherosclerotic heart disease of native coronary artery without angina pectoris: Secondary | ICD-10-CM

## 2012-08-03 DIAGNOSIS — I6529 Occlusion and stenosis of unspecified carotid artery: Secondary | ICD-10-CM

## 2012-08-03 DIAGNOSIS — Z952 Presence of prosthetic heart valve: Secondary | ICD-10-CM

## 2012-08-03 DIAGNOSIS — Z954 Presence of other heart-valve replacement: Secondary | ICD-10-CM

## 2012-08-03 DIAGNOSIS — R55 Syncope and collapse: Secondary | ICD-10-CM | POA: Insufficient documentation

## 2012-08-03 NOTE — Patient Instructions (Addendum)
Your physician recommends that you schedule a follow-up appointment in: 8 WEEKS [place] Jens Som [place]   Your physician has requested that you have an echocardiogram. Echocardiography is a painless test that uses sound waves to create images of your heart. It provides your doctor with information about the size and shape of your heart and how well your heart's chambers and valves are working. This procedure takes approximately one hour. There are no restrictions for this procedure.  Your physician has recommended that you wear an event monitor. Event monitors are medical devices that record the heart's electrical activity. Doctors most often [place] these monitors to diagnose arrhythmias. Arrhythmias are problems with the speed or rhythm of the heartbeat. The monitor is a small, portable device. You can wear one while you do your normal daily activities. This is usually used to diagnose what is causing palpitations/syncope (passing out).   Your physician has requested that you have a carotid duplex. This test is an ultrasound of the carotid arteries in your neck. It looks at blood flow through these arteries that supply the brain with blood. Allow one hour for this exam. There are no restrictions or special instructions.

## 2012-08-03 NOTE — Progress Notes (Signed)
HPI: Laurie [name] is a pleasant female who has a history of aortic stenosis status post aortic valve replacement in September 2009. She had a pericardial tissue valve. She also had a septal myectomy and a right middle lobectomy due to lung CA. Note, she has a 50% mid LAD by catheterization preoperatively. Abdominal CT in June of 2013 showed an abdominal aortic aneurysm measuring 3 cm and stable. There was mild narrowing of the right common iliac artery and measured up to 1.3 cm. Followup carotid Dopplers in Oct 2012 revealed a 40-59% left and 1-39% right stenosis. FU recommended in one year. An echocardiogram on Apr 26, 2010 showed normal LV function, no aortic stenosis, no aortic insufficiency and mild tricuspid regurgitation. I last saw her in Dec of 2011. Since then the patient denies any dyspnea on exertion, orthopnea, PND, pedal edema, palpitations, or chest pain. She has had spells of near syncope. She feels suddenly dizzy for one to 2 seconds and this resolves spontaneously. It is not related to position. No associated palpitations.   Current Outpatient Prescriptions  Medication Sig Dispense Refill  . acetaminophen (TYLENOL) 500 MG tablet Take 500 mg by mouth every 6 (six) hours as needed. Pain        . ALPRAZolam (XANAX) 0.5 MG tablet Take 1 tablet (0.5 mg total) by mouth 2 (two) times daily as needed.  60 tablet  1  . aspirin 81 MG tablet Take 81 mg by mouth daily.        Marland Kitchen lisinopril (PRINIVIL,ZESTRIL) 40 MG tablet TAKE 1 TABLET BY MOUTH EVERY DAY  90 tablet  3  . metoprolol (LOPRESSOR) 50 MG tablet TAKE 1 TABLET BY MOUTH TWO TIMES A DAY  180 tablet  0  . DISCONTD: lisinopril (PRINIVIL,ZESTRIL) 40 MG tablet Take 40 mg by mouth daily.          Past Medical History  Diagnosis Date  . Anemia     iron deficiency  . Shingles 11-09  . COPD (chronic obstructive pulmonary disease) 2005    on CXR with R apical scaring  . Atrial fibrillation     post op  . Hypertension   .  Cerebrovascular disease   . S/P aortic valve replacement 08/28/2008    [facility]  . S/P lobectomy of lung 08/28/2008    rt middle lobe  dr. Cornelius Moras  . Carcinoma of lung 08/28/2008    T2N0 moderately diff. squamous cell CA  . AAA (abdominal aortic aneurysm)   . CAD (coronary artery disease)     Past Surgical History  Procedure Date  . Aapy   . Tonsillectomy and adenoidectomy   . U/s guided aspiration of r breast cyst  2009    [facility]  . Rml removed dr Barry Dienes for lung cancer 08/28/2008    T2N0 squamous cell CA  . Cardiac valve replacement   . Abdominal surgery   . Tonsillectomy   . Aortic valve replacement 08/28/2008    #86mm [facility] Ease pericardial tissue valve  . Cardiac valve surgery 08/28/2008    septal myomectomy    History   Social History  . Marital Status: Single    Spouse Name: N/A    Number of Children: N/A  . Years of Education: N/A   Occupational History  . Not on file.   Social History Main Topics  . Smoking status: Former Smoker -- 40 years  . Smokeless tobacco: Not on file  . Alcohol Use: No  . Drug  Use: No  . Sexually Active: Not on file   Other Topics Concern  . Not on file   Social History Narrative  . No narrative on file    ROS: no fevers or chills, productive cough, hemoptysis, dysphasia, odynophagia, melena, hematochezia, dysuria, hematuria, rash, seizure activity, orthopnea, PND, pedal edema, claudication. Remaining systems are negative.  Physical Exam: Well-developed well-nourished in no acute distress.  Skin is warm and dry.  HEENT is normal.  Neck is supple. Chest is clear to auscultation with normal expansion.  Cardiovascular exam is regular rate and rhythm. 2/6 systolic murmur left sternal border. No diastolic murmur. Abdominal exam nontender or distended. No masses palpated. Extremities show no edema. neuro grossly intact  ECG sinus rhythm at a rate of 78. Normal axis. Left ventricular hypertrophy with  repolarization abnormality.

## 2012-08-03 NOTE — Assessment & Plan Note (Signed)
Continue diet. Intolerant to statins. 

## 2012-08-03 NOTE — Assessment & Plan Note (Signed)
Continue aspirin. Followup carotid Dopplers October 2013.

## 2012-08-03 NOTE — Assessment & Plan Note (Signed)
Etiology unclear. Check CardioNet.

## 2012-08-03 NOTE — Assessment & Plan Note (Signed)
followup CTA June 2013.

## 2012-08-03 NOTE — Assessment & Plan Note (Signed)
Continue aspirin 

## 2012-08-03 NOTE — Assessment & Plan Note (Signed)
Repeat echocardiogram. Continue SBE prophylaxis. 

## 2012-08-03 NOTE — Assessment & Plan Note (Signed)
followup CTA June 2013. 

## 2012-08-03 NOTE — Assessment & Plan Note (Signed)
Blood pressure mildly elevated but controlled at home. Continue present medications. 

## 2012-08-09 ENCOUNTER — Other Ambulatory Visit: Payer: Self-pay | Admitting: Thoracic Surgery (Cardiothoracic Vascular Surgery)

## 2012-08-09 DIAGNOSIS — C3490 Malignant neoplasm of unspecified part of unspecified bronchus or lung: Secondary | ICD-10-CM

## 2012-08-10 ENCOUNTER — Ambulatory Visit (HOSPITAL_COMMUNITY): Payer: Medicare Other | Attending: Cardiology

## 2012-08-10 DIAGNOSIS — I359 Nonrheumatic aortic valve disorder, unspecified: Secondary | ICD-10-CM | POA: Insufficient documentation

## 2012-08-10 DIAGNOSIS — Z87891 Personal history of nicotine dependence: Secondary | ICD-10-CM | POA: Insufficient documentation

## 2012-08-10 DIAGNOSIS — R55 Syncope and collapse: Secondary | ICD-10-CM

## 2012-08-10 DIAGNOSIS — Z952 Presence of prosthetic heart valve: Secondary | ICD-10-CM

## 2012-08-10 DIAGNOSIS — I10 Essential (primary) hypertension: Secondary | ICD-10-CM | POA: Insufficient documentation

## 2012-08-10 DIAGNOSIS — I2510 Atherosclerotic heart disease of native coronary artery without angina pectoris: Secondary | ICD-10-CM | POA: Insufficient documentation

## 2012-08-10 NOTE — Progress Notes (Signed)
Echocardiogram performed.  

## 2012-08-11 ENCOUNTER — Other Ambulatory Visit: Payer: Self-pay | Admitting: Thoracic Surgery (Cardiothoracic Vascular Surgery)

## 2012-08-11 ENCOUNTER — Telehealth: Payer: Self-pay | Admitting: Cardiology

## 2012-08-11 ENCOUNTER — Ambulatory Visit (INDEPENDENT_AMBULATORY_CARE_PROVIDER_SITE_OTHER): Payer: Medicare Other

## 2012-08-11 DIAGNOSIS — C342 Malignant neoplasm of middle lobe, bronchus or lung: Secondary | ICD-10-CM

## 2012-08-11 DIAGNOSIS — C3490 Malignant neoplasm of unspecified part of unspecified bronchus or lung: Secondary | ICD-10-CM

## 2012-08-11 DIAGNOSIS — I359 Nonrheumatic aortic valve disorder, unspecified: Secondary | ICD-10-CM

## 2012-08-11 DIAGNOSIS — Z954 Presence of other heart-valve replacement: Secondary | ICD-10-CM

## 2012-08-11 DIAGNOSIS — R0602 Shortness of breath: Secondary | ICD-10-CM

## 2012-08-11 DIAGNOSIS — R079 Chest pain, unspecified: Secondary | ICD-10-CM

## 2012-08-11 NOTE — Telephone Encounter (Signed)
pt  didn't get heart monitor yesterday, chest too sore, call if need to talk with her @ [phone]

## 2012-08-15 ENCOUNTER — Ambulatory Visit (INDEPENDENT_AMBULATORY_CARE_PROVIDER_SITE_OTHER): Payer: Medicare Other | Admitting: Thoracic Surgery (Cardiothoracic Vascular Surgery)

## 2012-08-15 ENCOUNTER — Encounter: Payer: Self-pay | Admitting: Thoracic Surgery (Cardiothoracic Vascular Surgery)

## 2012-08-15 VITALS — BP 154/88 | HR 62 | Resp 16 | Ht 67.0 in | Wt 180.0 lb

## 2012-08-15 DIAGNOSIS — Z902 Acquired absence of lung [part of]: Secondary | ICD-10-CM

## 2012-08-15 DIAGNOSIS — Z9889 Other specified postprocedural states: Secondary | ICD-10-CM

## 2012-08-15 DIAGNOSIS — C342 Malignant neoplasm of middle lobe, bronchus or lung: Secondary | ICD-10-CM

## 2012-08-15 NOTE — Patient Instructions (Signed)
Call and schedule repeat appointment if chest wall pain doesn't continue to improve over the next several weeks

## 2012-08-15 NOTE — Progress Notes (Signed)
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  At the same time she underwent aortic valve replacement and septal myomectomy using a bioprosthetic tissue valve. She was last seen here in our office one year ago. Since then she has remained essentially clinically stable. She states that she has had problems with intermittent abdominal pain off and on and she underwent a full evaluation including ultrasound of the abdomen and CAT scan of the abdomen recently. She also apparently had some very brief dizzy spells with near syncope for which a 30 day Holter monitor was ordered. However, the patient does not wish to have this Holter monitor performed and she does not wish to have any further medical tests done at this time. She states that she has had some mild soreness in her chest ever since her surgery 4 years ago that seems to wax and wane particularly with changes in the weather and strenuous physical activity. Several days ago she strained really hard to open up a jar of pickles and her chest became more sore after this. She has never had any sort of clicking or sensation of motion of the sternum and the symptoms are always reproduce with certain physical movement.  Since then the soreness has been gradually improving but has not gone away. Her appetite is stable as is her weight. She denies any problems with frequent headaches or visual disturbance. She denies any other musculoskeletal problems such as new or unusual pain in her back arms or legs. She has no shortness of breath nor productive cough. The remainder of her review of systems  unremarkable.   Current Outpatient Prescriptions  Medication Sig Dispense Refill  . acetaminophen (TYLENOL) 500 MG tablet Take 500 mg by mouth every 6 (six) hours as needed. Pain        . ALPRAZolam (XANAX) 0.5 MG tablet Take 1 tablet (0.5 mg total) by mouth 2 (two) times daily as needed.  60 tablet  1  . aspirin 81 MG tablet Take 81 mg by mouth daily.        Marland Kitchen lisinopril (PRINIVIL,ZESTRIL) 40 MG tablet TAKE 1 TABLET BY MOUTH EVERY DAY  90 tablet  3  . metoprolol (LOPRESSOR) 50 MG tablet TAKE 1 TABLET BY MOUTH TWO TIMES A DAY  180 tablet  0      Physical Exam:   BP 154/88  Pulse 62  Resp 16  Ht 5\' 7"  (1.702 m)  Wt 180 lb (81.647 kg)  BMI 28.19 kg/m2  SpO2 96%  General:  Well-appearing  Chest:   Clear to auscultation with symmetrical breath sounds. Sternum is stable on palpation. There is mild tenderness elicited with palpation across the chest wall that is nonspecific and not well localized. There is some scar tissue in the subcutaneous tissues immediately anterior to the manubrium which the patient complains about. This is soft and mobile and clearly not related to any underlying sternal wires.  CV:   Regular rate and rhythm with soft  systolic murmur heard best at the right upper sternal border  Abdomen:  Soft nontender  Extremities:  Warm and well-perfused with no palpable lymphadenopathy  Diagnostic Tests:  *RADIOLOGY REPORT*  Clinical Data: History of aortic valve replacement lung cancer.  Chest pain. shortness of breath.  CHEST - 2 VIEW  Comparison: None.  Findings: COPD with hyperinflation. Scarring right lung base. No  infiltrates or failure. Normal heart size. Prior AVR. Degenerative  changes thoracic spine.  IMPRESSION:  Stable COPD changes. No active disease.  Original Report Authenticated By: Elsie Stain, M.D.    Transthoracic Echocardiography  Patient: Brittne, Kawasaki MR #: [id number] Study Date: 08/10/2012 Gender: F Age: 45 Height:  170.2cm Weight: 82.6kg BSA: 1.30m^2 Pt. Status: Room:  Lajean Saver, Christeen Douglas, Arlys John ATTENDING Willa Rough SONOGRAPHER Aida Raider, RDCS PERFORMING [facility], [facility] cc:  ------------------------------------------------------------ LV EF: 65%  ------------------------------------------------------------ Indications: 424.1 Aortic valve disorders.  ------------------------------------------------------------ History: PMH: Acquired from the patient and from the patient's chart. PMH: History of Lung Cancer. Near syncope. AAA. CAD. Carotid Artery Disease. TIA. Iliac Artery Aneurysm. Risk factors: Former tobacco use. Hypertension. Dyslipidemia.  ------------------------------------------------------------ Study Conclusions  - Left ventricle: The parasternal images are very limited. It can be seen well in the apical images that there is excellent LV function. Wall thickness was increased in a pattern of moderate LVH. The estimated ejection fraction was 65%. Wall motion was normal; there were no regional wall motion abnormalities. - Aortic valve: There is a gradient that seems to be from the aortic valve, although the valve is not seen well. There is mild/moderate AS. Poorly visualized. Mean gradient: 21mm Hg (S). Peak gradient: 36mm Hg (S). - Mitral valve: The valve appears to be grossly normal.  ------------------------------------------------------------ Labs, prior tests, procedures, and surgery: Status post Aortic Valve Replacement with # 21 mm [facility] Ease Pericardial Tissue Valve-September 2009. Status post Septal Myectomy. Status post right middle Lobectomy. Transthoracic echocardiography. M-mode, complete 2D, spectral Doppler, and color Doppler. Height: Height: 170.2cm. Height: 67in. Weight: Weight: 82.6kg. Weight: 181.6lb. Body mass index: BMI: 28.5kg/m^2. Body surface area: BSA: 1.36m^2. Blood pressure: 149/85. Patient status:  Outpatient. Location: [place] [facility]  ------------------------------------------------------------  ------------------------------------------------------------ Left ventricle: The parasternal images are very limited. It can be seen well in the apical images that there is excellent LV function. Wall thickness was increased in a pattern of moderate LVH. The estimated ejection fraction was 65%. Wall motion was normal; there were no regional wall motion abnormalities.  ------------------------------------------------------------ Aortic valve: There is a gradient that seems to be from the aortic valve, although the valve is not seen well. There is mild/moderate AS. Poorly visualized. Doppler: VTI ratio of LVOT to aortic valve: 0.55. Peak velocity ratio of LVOT to aortic valve: 0.53. Mean gradient: 21mm Hg (S). Peak gradient: 36mm Hg (S).  ------------------------------------------------------------ Aorta: Aortic root: The aortic root was normal in size.  ------------------------------------------------------------ Mitral valve: The valve appears to be grossly normal. Doppler: Peak gradient: 3mm Hg (D).  ------------------------------------------------------------ Left atrium: The atrium was normal in size.  ------------------------------------------------------------ Right ventricle: The cavity size was normal. Systolic function was normal.  ------------------------------------------------------------ Pulmonic valve: Poorly visualized. Doppler: No significant regurgitation.  ------------------------------------------------------------ Tricuspid valve: Poorly visualized.  ------------------------------------------------------------ Right atrium: Poorly visualized.  ------------------------------------------------------------ Pericardium: There was no pericardial effusion.  ------------------------------------------------------------  2D measurements Normal Doppler  Normal Left ventricle measurements LVID ED, 30.7 mm 43-52 Left ventricle chord, Ea, lat 5.65 cm/ ------- PLAX ann, tiss s LVID ES, 19.3 mm  23-38 DP chord, E/Ea, lat 15.72 ------- PLAX ann, tiss FS, chord, 37 % >29 DP PLAX Ea, med 3.7 cm/ ------- LVPW, ED 15.6 mm ------ ann, tiss s IVS/LVPW 1 <1.3 DP ratio, ED E/Ea, med 24 ------- Ventricular septum ann, tiss IVS, ED 15.6 mm ------ DP Aorta LVOT Root diam, 33 mm ------ Peak vel, S 158 cm/ ------- ED s Left atrium VTI, S 32.7 cm ------- AP dim 26 mm ------ Peak 10 mm ------- AP dim 1.34 cm/m^2 <2.2 gradient, S Hg index Aortic valve Peak vel, S 300 cm/ ------- s Mean vel, S 207 cm/ ------- s VTI, S 59.6 cm ------- Mean 21 mm ------- gradient, S Hg Peak 36 mm ------- gradient, S Hg VTI ratio 0.55 ------- LVOT/AV Peak vel 0.53 ------- ratio, LVOT/AV Mitral valve Peak E vel 88.8 cm/ ------- s Peak A vel 109 cm/ ------- s Deceleratio 412 ms 150-230 n time Peak 3 mm ------- gradient, D Hg Peak E/A 0.8 ------- ratio Right ventricle Sa vel, lat 10.2 cm/ ------- ann, tiss s DP  ------------------------------------------------------------ Prepared and Electronically Authenticated by  Willa Rough 2013-09-11T20:12:12.127   Impression:  The patient is doing well now 4 years status post right middle lobectomy for T2 N0 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 She remains free of any symptoms or signs of locally recurrent nor distant metastatic disease. She is not smoking. She has some atypical chest wall pain that seems to be related to her previous surgery 4 years ago that his exacerbated with specific motions and physical activities. There is no sign of any sternal instability. Her chest x-ray looks completely normal.    Plan:  I've discussed a variety of options including proceeding with a chest CT scan or PET/CT scan because of the patient's persistent chest wall pain. However,  she is not interested in proceeding with any further diagnostic testing at this time and I feel that yield would be low. We'll plan to see her back in one year for routine followup and surveillance.  I've encouraged her to keep her followup appointments with Dr. Jens Som if she has any further episodes of dizziness or near-syncope. All of her questions been addressed.     Salvatore Decent. Cornelius Moras, MD 08/15/2012 10:39 AM

## 2012-08-24 NOTE — Telephone Encounter (Signed)
Spoke with pt, she is feeling better and is not having any problems at this time. She will call back if she

## 2012-08-24 NOTE — Telephone Encounter (Signed)
The pt wants to cancel the monitor at this time. She cx her follow up in 8 weeks because she is feeling better. She will call if she has more problems.

## 2012-09-01 ENCOUNTER — Other Ambulatory Visit: Payer: Self-pay | Admitting: Cardiology

## 2012-09-13 ENCOUNTER — Encounter (INDEPENDENT_AMBULATORY_CARE_PROVIDER_SITE_OTHER): Payer: Medicare Other

## 2012-09-13 DIAGNOSIS — I6529 Occlusion and stenosis of unspecified carotid artery: Secondary | ICD-10-CM

## 2012-09-13 DIAGNOSIS — Z952 Presence of prosthetic heart valve: Secondary | ICD-10-CM

## 2012-09-13 DIAGNOSIS — R55 Syncope and collapse: Secondary | ICD-10-CM

## 2012-09-28 ENCOUNTER — Ambulatory Visit: Payer: Medicare Other | Admitting: Cardiology

## 2012-10-11 ENCOUNTER — Other Ambulatory Visit: Payer: Self-pay | Admitting: Family Medicine

## 2012-11-29 ENCOUNTER — Other Ambulatory Visit: Payer: Self-pay | Admitting: Cardiology

## 2012-12-20 ENCOUNTER — Other Ambulatory Visit: Payer: Self-pay | Admitting: Family Medicine

## 2012-12-28 ENCOUNTER — Emergency Department
Admission: EM | Admit: 2012-12-28 | Discharge: 2012-12-28 | Disposition: A | Discharge status: Home / Self Care | Payer: Medicare Other | Source: Home / Self Care | Attending: Family Medicine | Admitting: Family Medicine

## 2012-12-28 ENCOUNTER — Emergency Department (INDEPENDENT_AMBULATORY_CARE_PROVIDER_SITE_OTHER): Payer: Medicare Other

## 2012-12-28 DIAGNOSIS — J4489 Other specified chronic obstructive pulmonary disease: Secondary | ICD-10-CM

## 2012-12-28 DIAGNOSIS — R05 Cough: Secondary | ICD-10-CM

## 2012-12-28 DIAGNOSIS — J029 Acute pharyngitis, unspecified: Secondary | ICD-10-CM

## 2012-12-28 DIAGNOSIS — B349 Viral infection, unspecified: Secondary | ICD-10-CM

## 2012-12-28 DIAGNOSIS — J449 Chronic obstructive pulmonary disease, unspecified: Secondary | ICD-10-CM

## 2012-12-28 LAB — POCT RAPID STREP A (OFFICE): Rapid Strep A Screen: NEGATIVE

## 2012-12-28 LAB — POCT INFLUENZA A/B: Influenza A, POC: NEGATIVE

## 2012-12-28 MED ORDER — DOXYCYCLINE HYCLATE 100 MG PO CAPS: 100.0000 mg | ORAL_CAPSULE | Freq: Two times a day (BID) | ORAL | Status: AC

## 2012-12-28 MED ORDER — OSELTAMIVIR PHOSPHATE 75 MG PO CAPS: 75.0000 mg | ORAL_CAPSULE | Freq: Two times a day (BID) | ORAL | Status: DC

## 2012-12-28 MED ORDER — PREDNISONE 50 MG PO TABS: ORAL_TABLET | ORAL | Status: DC

## 2012-12-28 NOTE — ED Notes (Signed)
Laurie [name] complains of sore throat and body aches for 3 days. She has had a low grade fever.

## 2012-12-28 NOTE — ED Provider Notes (Signed)
History     CSN: [id number]  Arrival date & time 12/28/12  1345   First MD Initiated Contact with Patient 12/28/12 1404      Chief Complaint  Patient presents with  . Sore Throat    x 3 days    HPI  URI Symptoms Onset: 3-4 days  Description: generalized malaise, productive cough, rhinorrhea, nasal congestion Modifying factors:  Former smoker. 40-50 pack year smoking history.   Symptoms Nasal discharge: yes Fever: yes Sore throat: yes Cough: yes Wheezing: faint Ear pain: no GI symptoms: no Sick contacts: no  Red Flags  Stiff neck: no Dyspnea: no Rash: no Swallowing difficulty: no  Sinusitis Risk Factors Headache/face pain: no Double sickening: no tooth pain: no  Allergy Risk Factors Sneezing: no Itchy scratchy throat: no Seasonal symptoms: no  Flu Risk Factors Headache: yes muscle aches: yes severe fatigue: mild    Past Medical History  Diagnosis Date  . Anemia     iron deficiency  . Shingles 11-09  . COPD (chronic obstructive pulmonary disease) 2005    on CXR with R apical scaring  . Atrial fibrillation     post op  . Hypertension   . Cerebrovascular disease   . S/P aortic valve replacement 08/28/2008    [facility]  . S/P lobectomy of lung 08/28/2008    rt middle lobe  dr. Cornelius Moras  . Carcinoma of lung 08/28/2008    T2N0 moderately diff. squamous cell CA  . AAA (abdominal aortic aneurysm)   . CAD (coronary artery disease)     Past Surgical History  Procedure Date  . Aapy   . Tonsillectomy and adenoidectomy   . U/s guided aspiration of r breast cyst  2009    at the breast clinic  . Rml removed dr Barry Dienes for lung cancer 08/28/2008    T2N0 squamous cell CA  . Cardiac valve replacement   . Abdominal surgery   . Tonsillectomy   . Aortic valve replacement 08/28/2008    #50mm [facility] Ease pericardial tissue valve  . Cardiac valve surgery 08/28/2008    septal myomectomy    Family History  Problem Relation Age of Onset  . Stroke Father     . Diabetes Son     brittle diabetes    History  Substance Use Topics  . Smoking status: Former Smoker -- 40 years  . Smokeless tobacco: Not on file  . Alcohol Use: No    OB History    Grav Para Term Preterm Abortions TAB SAB Ect Mult Living                  Review of Systems  All other systems reviewed and are negative.    Allergies  Statins and Rofecoxib  Home Medications   Current Outpatient Rx  Name  Route  Sig  Dispense  Refill  . ACETAMINOPHEN 500 MG PO TABS   Oral   Take 500 mg by mouth every 6 (six) hours as needed. Pain           . ALPRAZOLAM 0.5 MG PO TABS      TAKE 1 TABLET BY MOUTH TWICE A DAY AS NEEDED   60 tablet   0   . ASPIRIN 81 MG PO TABS   Oral   Take 81 mg by mouth daily.           Marland Kitchen LISINOPRIL 40 MG PO TABS      TAKE 1 TABLET BY MOUTH EVERY DAY  90 tablet   3   . METOPROLOL TARTRATE 50 MG PO TABS      TAKE 1 TABLET BY MOUTH TWO TIMES A DAY   180 tablet   0     BP 147/81  Pulse 79  Temp 99.3 F (37.4 C) (Oral)  Resp 18  Ht 5\' 7"  (1.702 m)  Wt 181 lb (82.101 kg)  BMI 28.35 kg/m2  SpO2 92%  Physical Exam  Constitutional: She appears well-developed and well-nourished.  HENT:  Head: Normocephalic and atraumatic.  Right Ear: External ear normal.  Left Ear: External ear normal.       +nasal erythema, rhinorrhea bilaterally, + post oropharyngeal erythema    Eyes: Conjunctivae normal are normal. Pupils are equal, round, and reactive to light.  Neck: Normal range of motion. Neck supple.  Cardiovascular: Normal rate, regular rhythm and normal heart sounds.   Pulmonary/Chest: Effort normal.       Faint wheezes in bases    Abdominal: Soft.  Musculoskeletal: Normal range of motion.  Neurological: She is alert.  Skin: Skin is warm.    ED Course  Procedures (including critical care time)   Labs Reviewed  POCT RAPID STREP A (OFFICE) - Normal   Dg Chest 2 View  12/28/2012  *RADIOLOGY REPORT*  Clinical Data:  Productive cough.  CHEST - 2 VIEW  Comparison: 08/11/2012  Findings: Prior median sternotomy and valve replacement.  Stable hyperinflation of the lungs.  Postoperative changes in the right lung.  Stable blunting of the right costophrenic angle, likely scarring.  Biapical scarring.  No acute opacities or effusions.  No acute bony abnormality.  IMPRESSION: COPD/chronic changes.  Postoperative changes.   Original Report Authenticated By: Charlett Nose, M.D.      1. Sore throat   2. Viral illness   3. COPD (chronic obstructive pulmonary disease)       MDM  Flu like sxs with some mild COPD exacerbation sxs.  Rapid flu and rapid strep negative.  No infiltrate on CXR Discussed IM steroids. Pt refused. Pt ok with oral steroids.  Tamiflu  Prednisone x 5 days.  Doxycycline. Discussed infectious and resp red flags at length.  Follow up with PCP in 5-7 days for general reevaluation.     The patient and/or caregiver has been counseled thoroughly with regard to treatment plan and/or medications prescribed including dosage, schedule, interactions, rationale for use, and possible side effects and they verbalize understanding. Diagnoses and expected course of recovery discussed and will return if not improved as expected or if the condition worsens. Patient and/or caregiver verbalized understanding.             Doree Albee, MD 12/28/12 [phone]

## 2012-12-31 ENCOUNTER — Telehealth: Payer: Self-pay

## 2012-12-31 NOTE — ED Notes (Signed)
Left a message on voice mail asking how patient is feeling and advising to call back with any questions or concerns.  

## 2013-01-18 ENCOUNTER — Ambulatory Visit (INDEPENDENT_AMBULATORY_CARE_PROVIDER_SITE_OTHER): Payer: Medicare Other | Admitting: Family Medicine

## 2013-01-18 ENCOUNTER — Encounter: Payer: Self-pay | Admitting: Family Medicine

## 2013-01-18 VITALS — BP 117/73 | HR 86 | Temp 98.0°F | Resp 18 | Wt 174.0 lb

## 2013-01-18 DIAGNOSIS — R05 Cough: Secondary | ICD-10-CM

## 2013-01-18 DIAGNOSIS — R1013 Epigastric pain: Secondary | ICD-10-CM

## 2013-01-18 LAB — COMPLETE METABOLIC PANEL WITH GFR
ALT: 14 U/L (ref 0–35)
Alkaline Phosphatase: 66 U/L (ref 39–117)
Creat: 0.7 mg/dL (ref 0.50–1.10)
GFR, Est Non African American: 85 mL/min
Glucose, Bld: 112 mg/dL — ABNORMAL HIGH (ref 70–99)
Sodium: 135 mEq/L (ref 135–145)
Total Bilirubin: 0.6 mg/dL (ref 0.3–1.2)
Total Protein: 6.8 g/dL (ref 6.0–8.3)

## 2013-01-18 LAB — CBC WITH DIFFERENTIAL/PLATELET
Basophils Relative: 0 % (ref 0–1)
Eosinophils Absolute: 0.3 10*3/uL (ref 0.0–0.7)
Eosinophils Relative: 2 % (ref 0–5)
MCH: 28.5 pg (ref 26.0–34.0)
MCHC: 34.3 g/dL (ref 30.0–36.0)
MCV: 83.3 fL (ref 78.0–100.0)
Neutrophils Relative %: 80 % — ABNORMAL HIGH (ref 43–77)
Platelets: 507 10*3/uL — ABNORMAL HIGH (ref 150–400)

## 2013-01-18 LAB — LIPASE: Lipase: 24 U/L (ref 0–75)

## 2013-01-18 MED ORDER — DEXLANSOPRAZOLE 60 MG PO CPDR: 60.0000 mg | DELAYED_RELEASE_CAPSULE | Freq: Every day | ORAL | Status: DC

## 2013-01-18 NOTE — Progress Notes (Signed)
  Subjective:    Patient ID: Laurie [name], female    DOB: November 01, 1935, 78 y.o.   MRN: [id number]  HPI Was seen at [facility] about 21 days ago for flu like illness with COPD exacerbation.  Was given doxy, steroids and tamilfu. Says only took one day of the doxycycline and says got really nauseated so topped it.  Didn't take the steroids or tamiflu since tested neg for flu.  Says still feels like has the flu but not fever, chills. Says gets abdominal pain about 15 min after eats and is moderate to severe.  Has some pressure in the epigastric area. Has a hx of hiatal hernia. No blood in the stool.  Cough a little in the AM but not during the rest of the day.  No sinus pain or pressure. occ has dry cough at night.  C/O no energy.  Normal CXR 3 weeks ago.    Review of Systems     Objective:   Physical Exam  Constitutional: She is oriented to person, place, and time. She appears well-developed and well-nourished.  HENT:  Head: Normocephalic and atraumatic.  Right Ear: External ear normal.  Left Ear: External ear normal.  Nose: Nose normal.  Mouth/Throat: Oropharynx is clear and moist.  TMs and canals are clear.   Eyes: Conjunctivae and EOM are normal. Pupils are equal, round, and reactive to light.  Neck: Neck supple. No thyromegaly present.  Cardiovascular: Normal rate, regular rhythm and normal heart sounds.   Pulmonary/Chest: Effort normal and breath sounds normal. She has no wheezes.  Abdominal: Soft. Bowel sounds are normal. She exhibits no distension and no mass. There is tenderness. There is no rebound and no guarding.  Tender in the RUQ, LUQ, and epigastrum. Mildly tender in the LLQ.   Lymphadenopathy:    She has no cervical adenopathy.  Neurological: She is alert and oriented to person, place, and time.  Skin: Skin is warm and dry.  Psychiatric: She has a normal mood and affect.          Assessment & Plan:  Epigastric pain - based on her history and exam today I'm concerned  about gastritis versus a gastric ulcer. I will give her a GI cocktail here in the office. We will check labs including a CBC, amylase and lipase evaluate for pancreatitis and liver enzymes to evaluate for hepatitis. I did not feel it is in the liver edge. Also given samples of dexilant 60 mg one a day before first meal of the day to see if this is helping her for the next 4-5 days. Recommend stopping all caffeine and given handout on reflux.  Cough-it sounds that this is very minor and only occurs sporadically at night it is mostly dry. This could certainly be reflux related. She does have a history of COPD and did not complete the course of antibiotics given her 3 weeks ago. If it continues or gets worse or she gets short of breath and consider further evaluation with repeat chest x-ray. We will see if being on a PPI improved her cough. Also sputum production changes Astra call the office immediately.

## 2013-01-18 NOTE — Patient Instructions (Addendum)
Start dexilant once a day before first meal of the day.   We will call you with your lab results. If you don't here from [place] in about a week then please give [place] a call at [phone].   Diet for Gastroesophageal Reflux Disease, Adult Reflux (acid reflux) is when acid from your stomach flows up into the esophagus. When acid comes in contact with the esophagus, the acid causes irritation and soreness (inflammation) in the esophagus. When reflux happens often or so severely that it causes damage to the esophagus, it is called gastroesophageal reflux disease (GERD). Nutrition therapy can help ease the discomfort of GERD. FOODS OR DRINKS TO AVOID OR LIMIT  Smoking or chewing tobacco. Nicotine is one of the most potent stimulants to acid production in the gastrointestinal tract.  Caffeinated and decaffeinated coffee and black tea.  Regular or low-calorie carbonated beverages or energy drinks (caffeine-free carbonated beverages are allowed).   Strong spices, such as black pepper, white pepper, red pepper, cayenne, curry powder, and chili powder.  Peppermint or spearmint.  Chocolate.  High-fat foods, including meats and fried foods. Extra added fats including oils, butter, salad dressings, and nuts. Limit these to less than 8 tsp per day.  Fruits and vegetables if they are not tolerated, such as citrus fruits or tomatoes.  Alcohol.  Any food that seems to aggravate your condition. If you have questions regarding your diet, call your caregiver or a registered dietitian. OTHER THINGS THAT MAY HELP GERD INCLUDE:   Eating your meals slowly, in a relaxed setting.  Eating 5 to 6 small meals per day instead of 3 large meals.  Eliminating food for a period of time if it causes distress.  Not lying down until 3 hours after eating a meal.  Keeping the head of your bed raised 6 to 9 inches (15 to 23 cm) by using a foam wedge or blocks under the legs of the bed. Lying flat may make symptoms  worse.  Being physically active. Weight loss may be helpful in reducing reflux in overweight or obese adults.  Wear loose fitting clothing EXAMPLE MEAL PLAN This meal plan is approximately 2,000 calories based on https://www.[facility].org/ meal planning guidelines. Breakfast   cup cooked oatmeal.  1 cup strawberries.  1 cup low-fat milk.  1 oz almonds. Snack  1 cup cucumber slices.  6 oz yogurt (made from low-fat or fat-free milk). Lunch  2 slice whole-wheat bread.  2 oz sliced [place].  2 tsp mayonnaise.  1 cup blueberries.  1 cup snap peas. Snack  6 whole-wheat crackers.  1 oz string cheese. Dinner   cup brown rice.  1 cup mixed veggies.  1 tsp olive oil.  3 oz grilled fish. Document Released: 11/16/2005 Document Revised: 02/08/2012 Document Reviewed: 10/02/2011 [facility] Patient Information 2013 [place], [place].

## 2013-01-19 ENCOUNTER — Other Ambulatory Visit: Payer: Self-pay | Admitting: Family Medicine

## 2013-01-19 MED ORDER — AMOXICILLIN-POT CLAVULANATE 875-125 MG PO TABS: 1.0000 | ORAL_TABLET | Freq: Two times a day (BID) | ORAL | Status: DC

## 2013-01-19 MED ORDER — AZITHROMYCIN 250 MG PO TABS: ORAL_TABLET | ORAL | Status: DC

## 2013-01-30 ENCOUNTER — Other Ambulatory Visit: Payer: Self-pay | Admitting: Family Medicine

## 2013-02-01 ENCOUNTER — Encounter: Payer: Self-pay | Admitting: Cardiology

## 2013-02-01 ENCOUNTER — Ambulatory Visit (INDEPENDENT_AMBULATORY_CARE_PROVIDER_SITE_OTHER): Payer: Medicare Other | Admitting: Cardiology

## 2013-02-01 VITALS — BP 158/82 | HR 76 | Wt 174.0 lb

## 2013-02-01 DIAGNOSIS — I10 Essential (primary) hypertension: Secondary | ICD-10-CM

## 2013-02-01 DIAGNOSIS — I723 Aneurysm of iliac artery: Secondary | ICD-10-CM

## 2013-02-01 DIAGNOSIS — Z954 Presence of other heart-valve replacement: Secondary | ICD-10-CM

## 2013-02-01 DIAGNOSIS — I714 Abdominal aortic aneurysm, without rupture: Secondary | ICD-10-CM

## 2013-02-01 MED ORDER — HYDROCHLOROTHIAZIDE 12.5 MG PO CAPS: 12.5000 mg | ORAL_CAPSULE | Freq: Every day | ORAL | Status: DC

## 2013-02-01 NOTE — Assessment & Plan Note (Signed)
Continue aspirin 

## 2013-02-01 NOTE — Assessment & Plan Note (Signed)
Blood pressure elevated. Add HCTZ 12.5 mg daily. Check potassium and renal function in one week. 

## 2013-02-01 NOTE — Assessment & Plan Note (Signed)
Continue SBE prophylaxis. 

## 2013-02-01 NOTE — Assessment & Plan Note (Signed)
Repeat CTA in June 2014.

## 2013-02-01 NOTE — Assessment & Plan Note (Signed)
Intolerant to statins. Continue diet. 

## 2013-02-01 NOTE — Assessment & Plan Note (Signed)
Repeat CTA June 2014.

## 2013-02-01 NOTE — Progress Notes (Signed)
HPI: Laurie [name] is a pleasant female who has a history of aortic stenosis status post aortic valve replacement in September 2009. She had a pericardial tissue valve. She also had a septal myectomy and a right middle lobectomy due to lung CA. Note, she has a 50% mid LAD by catheterization preoperatively. Last echocardiogram in September of 2013 showed an ejection fraction of 65%. The mean gradient across the aortic valve was 21 mm of mercury but the valve was not well visualized. Abdominal CT in June of 2013 showed an abdominal aortic aneurysm measuring 3 cm and stable. There was mild narrowing of the right common iliac artery and measured up to 1.3 cm. Followup carotid Dopplers in Oct 2013 revealed a 40-59% left and 1-39% right stenosis. FU recommended in one year. I last saw her in Sept 2013. She was complaining of dizzy spells and a monitor was ordered but she did not complete. Since I last saw her, she has had the flu but denies dyspnea, chest pain or syncope.   Current Outpatient Prescriptions  Medication Sig Dispense Refill  . acetaminophen (TYLENOL) 500 MG tablet Take 500 mg by mouth every 6 (six) hours as needed. Pain        . ALPRAZolam (XANAX) 0.5 MG tablet TAKE 1 TABLET BY MOUTH TWICE A DAY AS NEEDED  60 tablet  0  . aspirin 81 MG tablet Take 81 mg by mouth daily.        Marland Kitchen lisinopril (PRINIVIL,ZESTRIL) 40 MG tablet TAKE 1 TABLET BY MOUTH EVERY DAY  90 tablet  3  . metoprolol (LOPRESSOR) 50 MG tablet TAKE 1 TABLET BY MOUTH TWO TIMES A DAY  180 tablet  0   No current facility-administered medications for this visit.     Past Medical History  Diagnosis Date  . Anemia     iron deficiency  . Shingles 11-09  . COPD (chronic obstructive pulmonary disease) 2005    on CXR with R apical scaring  . Atrial fibrillation     post op  . Hypertension   . Cerebrovascular disease   . S/P aortic valve replacement 08/28/2008    [facility]  . S/P lobectomy of lung 08/28/2008    rt middle lobe  dr.  Cornelius Moras  . Carcinoma of lung 08/28/2008    T2N0 moderately diff. squamous cell CA  . AAA (abdominal aortic aneurysm)   . CAD (coronary artery disease)     Past Surgical History  Procedure Laterality Date  . Aapy    . Tonsillectomy and adenoidectomy    . U/s guided aspiration of r breast cyst   2009    [facility]  . Rml removed dr Barry Dienes for lung cancer  08/28/2008    T2N0 squamous cell CA  . Cardiac valve replacement    . Abdominal surgery    . Tonsillectomy    . Aortic valve replacement  08/28/2008    #83mm [facility] Ease pericardial tissue valve  . Cardiac valve surgery  08/28/2008    septal myomectomy    History   Social History  . Marital Status: Single    Spouse Name: N/A    Number of Children: N/A  . Years of Education: N/A   Occupational History  . Not on file.   Social History Main Topics  . Smoking status: Former Smoker -- 40 years  . Smokeless tobacco: Not on file  . Alcohol Use: No  . Drug Use: No  . Sexually Active: Not  on file   Other Topics Concern  . Not on file   Social History Narrative  . No narrative on file    ROS: no fevers or chills, productive cough, hemoptysis, dysphasia, odynophagia, melena, hematochezia, dysuria, hematuria, rash, seizure activity, orthopnea, PND, pedal edema, claudication. Remaining systems are negative.  Physical Exam: Well-developed well-nourished in no acute distress.  Skin is warm and dry.  HEENT is normal.  Neck is supple.  Chest is clear to auscultation with normal expansion.  Cardiovascular exam is regular rate and rhythm. 2/6 systolic murmur left border. No diastolic murmur. Abdominal exam nontender or distended. No masses palpated. Extremities show no edema. neuro grossly intact  ECG sinus rhythm at a rate of 76. Normal axis. Anterior lateral T-wave inversion. LVH.

## 2013-02-01 NOTE — Assessment & Plan Note (Signed)
Continue aspirin. Followup carotid Dopplers October 2014.

## 2013-02-01 NOTE — Patient Instructions (Addendum)
Your physician wants you to follow-up in: 6 MONTHS WITH DR Jens Som IN [place] You will receive a reminder letter in the mail two months in advance. If you don't receive a letter, please call our office to schedule the follow-up appointment.   START HCTZ 12.5 MG ONCE DAILY IN THE MORNING  Your physician recommends that you return for lab work in: ONE WEEK

## 2013-02-10 LAB — BASIC METABOLIC PANEL WITH GFR
Calcium: 9.2 mg/dL (ref 8.4–10.5)
GFR, Est African American: >89 mL/min
Glucose, Bld: 96 mg/dL (ref 70–99)
Potassium: 4.3 mEq/L (ref 3.5–5.3)
Sodium: 129 mEq/L — ABNORMAL LOW (ref 135–145)

## 2013-02-15 ENCOUNTER — Telehealth: Payer: Self-pay | Admitting: Cardiology

## 2013-02-15 DIAGNOSIS — E871 Hypo-osmolality and hyponatremia: Secondary | ICD-10-CM

## 2013-02-15 NOTE — Telephone Encounter (Signed)
Spoke with pt, aware of lab results. Order mailed to the pt for labs to be drawn in [place] in 2 weeks

## 2013-02-15 NOTE — Telephone Encounter (Signed)
New Problem:    Patient called in returning your call. Please call back. 

## 2013-02-28 LAB — BASIC METABOLIC PANEL WITH GFR
BUN: 10 mg/dL (ref 6–23)
Chloride: 92 mEq/L — ABNORMAL LOW (ref 96–112)
GFR, Est African American: >89 mL/min
GFR, Est Non African American: 80 mL/min
Potassium: 4.2 mEq/L (ref 3.5–5.3)
Sodium: 131 mEq/L — ABNORMAL LOW (ref 135–145)

## 2013-03-03 ENCOUNTER — Other Ambulatory Visit: Payer: Self-pay | Admitting: Family Medicine

## 2013-03-08 ENCOUNTER — Other Ambulatory Visit: Payer: Self-pay | Admitting: Physician Assistant

## 2013-03-24 ENCOUNTER — Other Ambulatory Visit: Payer: Self-pay | Admitting: Cardiology

## 2013-04-07 ENCOUNTER — Other Ambulatory Visit: Payer: Self-pay | Admitting: Physician Assistant

## 2013-04-12 ENCOUNTER — Other Ambulatory Visit: Payer: Self-pay | Admitting: *Deleted

## 2013-04-12 MED ORDER — ALPRAZOLAM 0.5 MG PO TABS: ORAL_TABLET | ORAL | Status: DC

## 2013-05-19 ENCOUNTER — Other Ambulatory Visit: Payer: Self-pay | Admitting: Family Medicine

## 2013-05-30 ENCOUNTER — Other Ambulatory Visit: Payer: Self-pay | Admitting: Family Medicine

## 2013-06-15 ENCOUNTER — Telehealth: Payer: Self-pay | Admitting: *Deleted

## 2013-06-15 DIAGNOSIS — I714 Abdominal aortic aneurysm, without rupture: Secondary | ICD-10-CM

## 2013-06-15 NOTE — Telephone Encounter (Signed)
Spoke with pt, she will have a CTA of the abdomen in the [facility] Tuesday 06-20-13 @ 10 am. She will go to the [facility] lab for bmp Friday or Monday. She will eat a light breakfast prior to the CTA.

## 2013-06-15 NOTE — Telephone Encounter (Signed)
Spoke with pt, it is time to schedule the CTA of the abdomen to evaluate her AAA. Will schedule and call the pt back.

## 2013-06-16 ENCOUNTER — Other Ambulatory Visit: Payer: Self-pay | Admitting: Physician Assistant

## 2013-06-16 LAB — BASIC METABOLIC PANEL WITH GFR
CO2: 30 mEq/L (ref 19–32)
Chloride: 91 mEq/L — ABNORMAL LOW (ref 96–112)
GFR, Est Non African American: 85 mL/min
Potassium: 4.5 mEq/L (ref 3.5–5.3)
Sodium: 130 mEq/L — ABNORMAL LOW (ref 135–145)

## 2013-06-20 ENCOUNTER — Encounter (HOSPITAL_BASED_OUTPATIENT_CLINIC_OR_DEPARTMENT_OTHER): Payer: Self-pay

## 2013-06-20 ENCOUNTER — Ambulatory Visit (HOSPITAL_BASED_OUTPATIENT_CLINIC_OR_DEPARTMENT_OTHER)
Admission: RE | Admit: 2013-06-20 | Discharge: 2013-06-20 | Disposition: A | Discharge status: Home / Self Care | Payer: Medicare Other | Source: Ambulatory Visit | Attending: Cardiology | Admitting: Cardiology

## 2013-06-20 DIAGNOSIS — E279 Disorder of adrenal gland, unspecified: Secondary | ICD-10-CM | POA: Insufficient documentation

## 2013-06-20 DIAGNOSIS — I714 Abdominal aortic aneurysm, without rupture, unspecified: Secondary | ICD-10-CM | POA: Insufficient documentation

## 2013-06-20 DIAGNOSIS — I77819 Aortic ectasia, unspecified site: Secondary | ICD-10-CM | POA: Insufficient documentation

## 2013-06-20 DIAGNOSIS — K5710 Diverticulosis of small intestine without perforation or abscess without bleeding: Secondary | ICD-10-CM | POA: Insufficient documentation

## 2013-06-20 DIAGNOSIS — J984 Other disorders of lung: Secondary | ICD-10-CM | POA: Insufficient documentation

## 2013-06-20 DIAGNOSIS — I748 Embolism and thrombosis of other arteries: Secondary | ICD-10-CM | POA: Insufficient documentation

## 2013-06-20 DIAGNOSIS — I708 Atherosclerosis of other arteries: Secondary | ICD-10-CM | POA: Insufficient documentation

## 2013-06-20 MED ORDER — IOHEXOL 350 MG/ML SOLN
100.0000 mL | Freq: Once | INTRAVENOUS | Status: AC | PRN
  Administered 2013-06-20: 100 mL via INTRAVENOUS

## 2013-07-18 ENCOUNTER — Other Ambulatory Visit: Payer: Self-pay | Admitting: Family Medicine

## 2013-07-27 ENCOUNTER — Other Ambulatory Visit: Payer: Self-pay | Admitting: *Deleted

## 2013-08-01 ENCOUNTER — Other Ambulatory Visit: Payer: Self-pay | Admitting: *Deleted

## 2013-08-16 ENCOUNTER — Other Ambulatory Visit: Payer: Self-pay | Admitting: Family Medicine

## 2013-08-17 ENCOUNTER — Other Ambulatory Visit: Payer: Self-pay | Admitting: Family Medicine

## 2013-08-17 NOTE — Telephone Encounter (Signed)
Pt called and advised that Dr. Linford Arnold will refill for 1 more month and she will need to call and schedule an appt to be seen. Pt voiced understanding and agreed and will call back to make an appt.Laureen Ochs, Viann Shove

## 2013-08-23 ENCOUNTER — Other Ambulatory Visit: Payer: Self-pay | Admitting: *Deleted

## 2013-08-24 LAB — BUN: BUN: 10 mg/dL (ref 6–23)

## 2013-08-24 LAB — CREATININE, SERUM: Creat: 0.71 mg/dL (ref 0.50–1.10)

## 2013-08-25 ENCOUNTER — Other Ambulatory Visit: Payer: Medicare Other

## 2013-08-28 ENCOUNTER — Ambulatory Visit
Admission: RE | Admit: 2013-08-28 | Discharge: 2013-08-28 | Disposition: A | Discharge status: Home / Self Care | Payer: Self-pay | Source: Ambulatory Visit | Attending: Thoracic Surgery (Cardiothoracic Vascular Surgery) | Admitting: Thoracic Surgery (Cardiothoracic Vascular Surgery)

## 2013-08-28 ENCOUNTER — Encounter: Payer: Self-pay | Admitting: Thoracic Surgery (Cardiothoracic Vascular Surgery)

## 2013-08-28 ENCOUNTER — Ambulatory Visit (INDEPENDENT_AMBULATORY_CARE_PROVIDER_SITE_OTHER): Payer: Medicare Other | Admitting: Thoracic Surgery (Cardiothoracic Vascular Surgery)

## 2013-08-28 VITALS — BP 115/66 | HR 81 | Resp 16 | Ht 67.0 in | Wt 178.0 lb

## 2013-08-28 DIAGNOSIS — Z9889 Other specified postprocedural states: Secondary | ICD-10-CM

## 2013-08-28 DIAGNOSIS — C342 Malignant neoplasm of middle lobe, bronchus or lung: Secondary | ICD-10-CM

## 2013-08-28 DIAGNOSIS — Z902 Acquired absence of lung [part of]: Secondary | ICD-10-CM

## 2013-08-28 MED ORDER — IOHEXOL 300 MG/ML  SOLN
75.0000 mL | Freq: Once | INTRAMUSCULAR | Status: AC | PRN
  Administered 2013-08-28: 75 mL via INTRAVENOUS

## 2013-08-28 NOTE — Progress Notes (Signed)
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 At the same time she underwent aortic valve replacement and septal myomectomy using a bioprosthetic tissue valve. She was last seen here in our office one year ago. Since then she has remained essentially clinically stable.  She still complains of intermittent soreness in her chest which seems to be exacerbated by cold, damp weather or heavy lifting.  She is not smoking. Her appetite is stable. Her weight is stable. She denies any cough. She has not had any other types of chest discomfort. The remainder of her review of systems is unremarkable.    Current Outpatient Prescriptions  Medication Sig Dispense Refill  . acetaminophen (TYLENOL) 500 MG tablet Take 500 mg by mouth every 6 (six) hours as needed. Pain        . ALPRAZolam (XANAX) 0.5 MG tablet TAKE 1 TABLET BY MOUTH TWICE DAILY  60 tablet  0  . aspirin 81 MG tablet Take 81 mg by mouth daily.        . hydrochlorothiazide (MICROZIDE) 12.5 MG capsule Take 1 capsule (12.5 mg total) by mouth daily.  90 capsule  3  . lisinopril (PRINIVIL,ZESTRIL) 40 MG tablet TAKE 1 TABLET BY MOUTH EVERY DAY  90 tablet  3  . metoprolol (LOPRESSOR) 50 MG tablet TAKE 1 TABLET BY MOUTH TWO TIMES A DAY  180 tablet  0   No current facility-administered medications for this visit.      Physical Exam:   BP 115/66  Pulse 81  Resp 16  Ht 5\' 7"  (1.702 m)  Wt 178 lb (80.74 kg)  BMI 27.87 kg/m2  SpO2 96%  General:  Well-appearing  Chest:   Clear to auscultation  CV:   Regular rate and rhythm  Incisions:  Completely healed, sternum is stable  Abdomen:  Soft and  nontender  Extremities:  Warm and well-perfused  Diagnostic Tests:  CLINICAL DATA: Lung cancer. Postop resection  EXAM:  CT CHEST WITH CONTRAST  TECHNIQUE:  Multidetector CT imaging of the chest was performed during  intravenous contrast administration.  CONTRAST: 75mL OMNIPAQUE IOHEXOL 300 MG/ML SOLN  COMPARISON: 12/28/2012. CT 07/14/2011  FINDINGS:  Postop lobectomy changes on the right. There is scarring in the  region of the right middle lobe from prior resection. No recurrent  mass or adenopathy is present. Small amount of pleural thickening in  the right posterior lung base unchanged from the CT 07/14/2011. 4 mm  nodule left lower lobe posteriorly unchanged from 07/14/2011. No new  lung nodule is identified. No mediastinal adenopathy.  Aortic valve replacement. Atherosclerotic disease in the aorta and  great vessels.  Negative for pneumonia or edema.  IMPRESSION:  Postop lobectomy on the right. No recurrent tumor identified  4 mm left lower lobe nodule stable from 2012.  Electronically Signed:  By: Marlan Palau M.D.  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   Plan:  The patient  will return in one years time for routine followup and chest x-ray.   Salvatore Decent. Cornelius Moras, MD 08/28/2013 2:40 PM    Addendum to previous report from CT scan:   Patric Dykes., MD [facility] Aug 28, 2013 1:16:35 PM EDT       ADDENDUM REPORT: 08/28/2013 13:14  ADDENDUM:  Biliary ductal dilatation is present but incompletely evaluated on  this study. Question progression of intrahepatic ductal dilatation  since the CT of 06/20/2013. Correlate with liver function tests.  Electronically Signed  By: Marlan Palau M.D.  On: 08/28/2013 13:14   We'll defer the need for any further workup related to possible biliary ductal dilatation to the  patient's primary care physician.

## 2013-08-29 ENCOUNTER — Telehealth: Payer: Self-pay | Admitting: Family Medicine

## 2013-08-29 DIAGNOSIS — R9389 Abnormal findings on diagnostic imaging of other specified body structures: Secondary | ICD-10-CM

## 2013-08-29 NOTE — Telephone Encounter (Signed)
Call patient: There was a little bit of biliary ductal dilatation on the CT of her chest that was done. It was forwarded to me by Dr. Cornelius Moras. We will need to schedule her for an ultrasound of her gallbladder as well as check liver enzymes. I will place order to both of these and they should be contacting her soon.

## 2013-08-29 NOTE — Telephone Encounter (Signed)
Pt notified & labs faxed downstairs.

## 2013-08-30 LAB — COMPLETE METABOLIC PANEL WITH GFR
ALT: 16 U/L (ref 0–35)
AST: 18 U/L (ref 0–37)
Albumin: 4.1 g/dL (ref 3.5–5.2)
Alkaline Phosphatase: 65 U/L (ref 39–117)
BUN: 9 mg/dL (ref 6–23)
Calcium: 9.4 mg/dL (ref 8.4–10.5)
Chloride: 94 mEq/L — ABNORMAL LOW (ref 96–112)
Potassium: 4.2 mEq/L (ref 3.5–5.3)
Sodium: 132 mEq/L — ABNORMAL LOW (ref 135–145)

## 2013-08-31 ENCOUNTER — Other Ambulatory Visit: Payer: Self-pay | Admitting: *Deleted

## 2013-08-31 ENCOUNTER — Other Ambulatory Visit: Payer: Self-pay | Admitting: Family Medicine

## 2013-08-31 DIAGNOSIS — E871 Hypo-osmolality and hyponatremia: Secondary | ICD-10-CM

## 2013-09-04 ENCOUNTER — Ambulatory Visit (INDEPENDENT_AMBULATORY_CARE_PROVIDER_SITE_OTHER): Payer: Medicare Other

## 2013-09-04 DIAGNOSIS — R932 Abnormal findings on diagnostic imaging of liver and biliary tract: Secondary | ICD-10-CM

## 2013-09-04 DIAGNOSIS — R9389 Abnormal findings on diagnostic imaging of other specified body structures: Secondary | ICD-10-CM

## 2013-09-04 DIAGNOSIS — K838 Other specified diseases of biliary tract: Secondary | ICD-10-CM

## 2013-09-04 DIAGNOSIS — I77811 Abdominal aortic ectasia: Secondary | ICD-10-CM

## 2013-09-19 ENCOUNTER — Encounter: Payer: Self-pay | Admitting: Family Medicine

## 2013-09-19 ENCOUNTER — Ambulatory Visit (INDEPENDENT_AMBULATORY_CARE_PROVIDER_SITE_OTHER): Payer: Medicare Other | Admitting: Family Medicine

## 2013-09-19 VITALS — BP 129/74 | HR 70 | Wt 176.0 lb

## 2013-09-19 DIAGNOSIS — K047 Periapical abscess without sinus: Secondary | ICD-10-CM

## 2013-09-19 DIAGNOSIS — K811 Chronic cholecystitis: Secondary | ICD-10-CM

## 2013-09-19 DIAGNOSIS — L219 Seborrheic dermatitis, unspecified: Secondary | ICD-10-CM

## 2013-09-19 MED ORDER — ALPRAZOLAM 0.5 MG PO TABS: ORAL_TABLET | ORAL | Status: DC

## 2013-09-19 MED ORDER — CLOBETASOL PROPIONATE 0.05 % EX FOAM: Freq: Every day | CUTANEOUS | Status: DC | PRN

## 2013-09-19 MED ORDER — AMOXICILLIN-POT CLAVULANATE 875-125 MG PO TABS: 1.0000 | ORAL_TABLET | Freq: Two times a day (BID) | ORAL | Status: DC

## 2013-09-19 NOTE — Progress Notes (Signed)
Subjective:    Patient ID: Laurie [name], female    DOB: [birth year], 78 y.o.   MRN: [id number]  HPI Discussed results of U/S. Say does have abdominal pain esp in the RUQ. Will hurt for up to a week at a time. Has been going on for years.  Will have times when doesn't bother her. Stays it feels like a rag stuffed under her ribs on the right.Fredna Dow when hurst doesn't want to eat. Gets nauseated at times. No vomiting. No fever.  US showed sludge and stable ductal dilatation.  Says doesn't feel well after eating.    Dental abscess - she has a very painful sore abscess tooth on the left upper jaw. It is be extracted. Unfortunately she does not have a dentist that she sees routinely. No fevers chills or sweats.  Rash on lower scalp - she noticed a dry scaly rash on her scalp over the last couple weeks. She says occasionally it is itchy but not very bothersome. She's had this similar rash before.  Review of Systems BP 129/74  Pulse 70  Wt 176 lb (79.833 kg)  BMI 27.56 kg/m2    Allergies  Allergen Reactions  . Statins     Muscle aches  . Rofecoxib Rash    Past Medical History  Diagnosis Date  . Anemia     iron deficiency  . Shingles 11-09  . COPD (chronic obstructive pulmonary disease) 2005    on CXR with R apical scaring  . Atrial fibrillation     post op  . Hypertension   . Cerebrovascular disease   . S/P aortic valve replacement 08/28/2008    [facility]  . S/P lobectomy of lung 08/28/2008    rt middle lobe  dr. Cornelius Moras  . Carcinoma of lung 08/28/2008    T2N0 moderately diff. squamous cell CA  . AAA (abdominal aortic aneurysm)   . CAD (coronary artery disease)     Past Surgical History  Procedure Laterality Date  . Aapy    . Tonsillectomy and adenoidectomy    . U/s guided aspiration of r breast cyst   [facility]    [facility]  . Rml removed dr Barry Dienes for lung cancer  08/28/2008    T2N0 squamous cell CA  . Cardiac valve replacement    . Abdominal surgery    .  Tonsillectomy    . Aortic valve replacement  08/28/2008    #44mm [facility] Ease pericardial tissue valve  . Cardiac valve surgery  08/28/2008    septal myomectomy    History   Social History  . Marital Status: Single    Spouse Name: N/A    Number of Children: N/A  . Years of Education: N/A   Occupational History  . Not on file.   Social History Main Topics  . Smoking status: Former Smoker -- 40 years  . Smokeless tobacco: Not on file  . Alcohol Use: No  . Drug Use: No  . Sexual Activity: Not on file   Other Topics Concern  . Not on file   Social History Narrative  . No narrative on file    Family History  Problem Relation Age of Onset  . Stroke Father   . Diabetes Son     brittle diabetes    Outpatient Encounter Prescriptions as of 09/19/2013  Medication Sig Dispense Refill  . acetaminophen (TYLENOL) 500 MG tablet Take 500 mg by mouth every 6 (six) hours as needed. Pain        .  ALPRAZolam (XANAX) 0.5 MG tablet TAKE 1 TABLET BY MOUTH TWICE DAILY  60 tablet  0  . amoxicillin-clavulanate (AUGMENTIN) 875-125 MG per tablet Take 1 tablet by mouth 2 (two) times daily.  20 tablet  0  . aspirin 81 MG tablet Take 81 mg by mouth daily.        . clobetasol (OLUX) 0.05 % topical foam Apply topically daily as needed.  50 g  0  . hydrochlorothiazide (MICROZIDE) 12.5 MG capsule Take 1 capsule (12.5 mg total) by mouth daily.  90 capsule  3  . lisinopril (PRINIVIL,ZESTRIL) 40 MG tablet TAKE 1 TABLET BY MOUTH EVERY DAY  90 tablet  3  . metoprolol (LOPRESSOR) 50 MG tablet TAKE 1 TABLET BY MOUTH TWO TIMES A DAY  180 tablet  0  . [DISCONTINUED] ALPRAZolam (XANAX) 0.5 MG tablet TAKE 1 TABLET BY MOUTH TWICE DAILY  60 tablet  0   No facility-administered encounter medications on file as of 09/19/2013.           Objective:   Physical Exam  Constitutional: She is oriented to person, place, and time. She appears well-developed and well-nourished.  HENT:  Head: Normocephalic  and atraumatic.  She has a broken molar with decay on the left upper jaw.   Abdominal: Soft. Bowel sounds are normal. She exhibits no distension and no mass. There is no tenderness. There is no rebound and no guarding.  Neurological: She is alert and oriented to person, place, and time.  Skin: Skin is warm and dry.  She has an erythematous rash at the base of the scalp near the nape of the neck with large scale. A few excoriations. No swollen lymph nodes.  Psychiatric: She has a normal mood and affect. Her behavior is normal.          Assessment & Plan:  Cholecystitis - Recommend referral to general surgeon to discuss evaluation. I really think based on her symptoms in the sludge in her gallbladder that she would benefit from gallbladder removal. We discussed that this can typically be done laparoscopically. I recommend further evaluation and discussion with general surgery. She is agreeable. She would likely need cardiac clearance based on her heart history.  Dental abscess - right upper jaw, posterioroly. Will tx with augmentin but has to get a dentist as the antibiotic is only a temporary measure.    Rash, scalp - seborrheic Dermatitis will tx with topical steroid foam.  If not improving then consider treatment with an antifungal for seborrheic dermatitis.

## 2013-09-21 ENCOUNTER — Other Ambulatory Visit: Payer: Self-pay | Admitting: *Deleted

## 2013-09-21 DIAGNOSIS — I679 Cerebrovascular disease, unspecified: Secondary | ICD-10-CM

## 2013-09-26 ENCOUNTER — Encounter (INDEPENDENT_AMBULATORY_CARE_PROVIDER_SITE_OTHER): Payer: Self-pay

## 2013-09-26 ENCOUNTER — Encounter (INDEPENDENT_AMBULATORY_CARE_PROVIDER_SITE_OTHER): Payer: Self-pay | Admitting: General Surgery

## 2013-09-26 ENCOUNTER — Ambulatory Visit (INDEPENDENT_AMBULATORY_CARE_PROVIDER_SITE_OTHER): Payer: Medicare Other | Admitting: General Surgery

## 2013-09-26 ENCOUNTER — Telehealth (INDEPENDENT_AMBULATORY_CARE_PROVIDER_SITE_OTHER): Payer: Self-pay

## 2013-09-26 VITALS — BP 118/60 | HR 64 | Temp 98.0°F | Resp 14 | Ht 67.0 in | Wt 175.0 lb

## 2013-09-26 DIAGNOSIS — K8021 Calculus of gallbladder without cholecystitis with obstruction: Secondary | ICD-10-CM

## 2013-09-26 DIAGNOSIS — K8050 Calculus of bile duct without cholangitis or cholecystitis without obstruction: Secondary | ICD-10-CM

## 2013-09-26 DIAGNOSIS — K8020 Calculus of gallbladder without cholecystitis without obstruction: Secondary | ICD-10-CM

## 2013-09-26 NOTE — Progress Notes (Signed)
Patient ID: Laurie [name], female   DOB: 02/03/1935, 78 y.o.   MRN: [id number]  Chief Complaint  Patient presents with  . New Evaluation    eval GB    HPI Laurie [name] is a 78 y.o. female.  The patient is a 78 year old female who is referred by Dr. Linford Arnold for an evaluation of gallbladder dysfunction/pain. Patient states she's had pain for the last several years. She states the right upper quadrant and feel is unchanged with meals. She has some minor associated nausea however no emesis. Patient underwent ultrasound which revealed sludge in the gallbladder and a mildly dilated CBD at approximately 1 cm. Patient's LFTs were within normal limits. HPI  Past Medical History  Diagnosis Date  . Anemia     iron deficiency  . Shingles 11-09  . COPD (chronic obstructive pulmonary disease) 2005    on CXR with R apical scaring  . Atrial fibrillation     post op  . Hypertension   . Cerebrovascular disease   . S/P aortic valve replacement 08/28/2008    [place]ie [name]  . S/P lobectomy of lung 08/28/2008    rt middle lobe  dr. Cornelius Moras  . Carcinoma of lung 08/28/2008    T2N0 moderately diff. squamous cell CA  . AAA (abdominal aortic aneurysm)   . CAD (coronary artery disease)     Past Surgical History  Procedure Laterality Date  . Aapy    . Tonsillectomy and adenoidectomy    . U/s guided aspiration of r breast cyst   2009    [facility]  . Rml removed dr Barry Dienes for lung cancer  08/28/2008    T2N0 squamous cell CA  . Cardiac valve replacement    . Abdominal surgery    . Tonsillectomy    . Aortic valve replacement  08/28/2008    #19mm [facility] Ease pericardial tissue valve  . Cardiac valve surgery  08/28/2008    septal myomectomy    Family History  Problem Relation Age of Onset  . Stroke Father   . Diabetes Son     brittle diabetes    Social History History  Substance Use Topics  . Smoking status: Former Smoker -- 40 years  . Smokeless tobacco: Never Used  .  Alcohol Use: No    Allergies  Allergen Reactions  . Statins     Muscle aches  . Rofecoxib Rash    Current Outpatient Prescriptions  Medication Sig Dispense Refill  . acetaminophen (TYLENOL) 500 MG tablet Take 500 mg by mouth every 6 (six) hours as needed. Pain        . ALPRAZolam (XANAX) 0.5 MG tablet TAKE 1 TABLET BY MOUTH TWICE DAILY  60 tablet  0  . aspirin 81 MG tablet Take 81 mg by mouth daily.        . clobetasol (OLUX) 0.05 % topical foam Apply topically daily as needed.  50 g  0  . hydrochlorothiazide (MICROZIDE) 12.5 MG capsule Take 1 capsule (12.5 mg total) by mouth daily.  90 capsule  3  . lisinopril (PRINIVIL,ZESTRIL) 40 MG tablet TAKE 1 TABLET BY MOUTH EVERY DAY  90 tablet  3  . metoprolol (LOPRESSOR) 50 MG tablet TAKE 1 TABLET BY MOUTH TWO TIMES A DAY  180 tablet  0   No current facility-administered medications for this visit.    Review of Systems Review of Systems  Constitutional: Negative.   HENT: Negative.   Respiratory: Negative.   Cardiovascular: Negative.  Gastrointestinal: Positive for abdominal pain.  Neurological: Negative.     Blood pressure 118/60, pulse 64, temperature 98 F (36.7 C), temperature source Temporal, resp. rate 14, height 5\' 7"  (1.702 m), weight 175 lb (79.379 kg).  Physical Exam Physical Exam  Constitutional: She is oriented to person, place, and time. She appears well-developed and well-nourished.  HENT:  Head: Normocephalic and atraumatic.  Eyes: Conjunctivae and EOM are normal. Pupils are equal, round, and reactive to light.  Neck: Normal range of motion. Neck supple.  Cardiovascular: Normal rate, regular rhythm and normal heart sounds.   Pulmonary/Chest: Effort normal and breath sounds normal.  Abdominal: Soft. Bowel sounds are normal. There is tenderness in the right upper quadrant.  Musculoskeletal: Normal range of motion.  Neurological: She is alert and oriented to person, place, and time.  Skin: Skin is warm and dry.     Data Reviewed Ultrasound revealed biliary sludge  Assessment    78 year old female with symptomatic cholelithiasis versus biliary dyskinesia.     Plan    1. I discussed with the patient and her son the possibility of proceeding with a HIDA scan to diagnose biliary dyskinesia. The patient's status secondary to pain she would rather proceed to removal of her gallbladder. 2. All risks and benefits were discussed with the patient to generally include: infection, bleeding, possible need for post op ERCP, damage to the bile ducts, and bile leak. Alternatives were offered and described.  All questions were answered and the patient voiced understanding of the procedure and wishes to proceed at this point with a laparoscopic cholecystectomy 3. The patient sees Dr. Jens Som for her aortic valve replacement as well as aneurysms, and will need cardiac clearance prior to scheduling. Upon scheduling we will schedule her for a laparoscopic cholecystectomy with no IOC.        Marigene Ehlers., Rainie Crenshaw 09/26/2013, 10:30 AM

## 2013-09-26 NOTE — Telephone Encounter (Signed)
Pre-Op cardiac clearance faxed to Dr. Jens Som @ [phone]

## 2013-09-29 ENCOUNTER — Encounter (INDEPENDENT_AMBULATORY_CARE_PROVIDER_SITE_OTHER): Payer: Self-pay

## 2013-09-29 NOTE — Progress Notes (Signed)
     -----   Message -----    From: Lewayne Bunting, MD    Sent: 09/26/2013 10:50 AM    To: Deliah Goody, RN        Ok for cholecystectomy    Olga Millers        ----- Message -----    From: Maryan Puls, CMA    Sent: 09/26/2013 10:31 AM    To: Lewayne Bunting, MD                         Laurie [name] Description: 78 year old female  09/26/2013 Letter (Out) Provider: Maryan Puls, CMA  MRN: [id number] Department: [facility]         Clinical Letter Summary    Letter from: Maryan Puls   Comments: Pre-Op Cardiac Clearance         Letters    Letter Information      Status    Maryan Puls on 09/26/2013 Documentation: Pre-Op Cardiac Clearance Sent             Patient Demographics    Address Phone   [address] [place] [address] [phone] [facility]) [phone] [facility])

## 2013-10-02 ENCOUNTER — Ambulatory Visit (HOSPITAL_COMMUNITY): Payer: Medicare Other | Attending: Cardiology

## 2013-10-02 ENCOUNTER — Encounter (INDEPENDENT_AMBULATORY_CARE_PROVIDER_SITE_OTHER): Payer: Self-pay

## 2013-10-02 DIAGNOSIS — I739 Peripheral vascular disease, unspecified: Secondary | ICD-10-CM | POA: Insufficient documentation

## 2013-10-02 DIAGNOSIS — I679 Cerebrovascular disease, unspecified: Secondary | ICD-10-CM

## 2013-10-02 DIAGNOSIS — I658 Occlusion and stenosis of other precerebral arteries: Secondary | ICD-10-CM | POA: Insufficient documentation

## 2013-10-02 DIAGNOSIS — J449 Chronic obstructive pulmonary disease, unspecified: Secondary | ICD-10-CM | POA: Insufficient documentation

## 2013-10-02 DIAGNOSIS — J4489 Other specified chronic obstructive pulmonary disease: Secondary | ICD-10-CM | POA: Insufficient documentation

## 2013-10-02 DIAGNOSIS — Z8673 Personal history of transient ischemic attack (TIA), and cerebral infarction without residual deficits: Secondary | ICD-10-CM | POA: Insufficient documentation

## 2013-10-02 DIAGNOSIS — I10 Essential (primary) hypertension: Secondary | ICD-10-CM | POA: Insufficient documentation

## 2013-10-02 DIAGNOSIS — I6529 Occlusion and stenosis of unspecified carotid artery: Secondary | ICD-10-CM | POA: Insufficient documentation

## 2013-10-02 DIAGNOSIS — F17200 Nicotine dependence, unspecified, uncomplicated: Secondary | ICD-10-CM | POA: Insufficient documentation

## 2013-10-03 ENCOUNTER — Other Ambulatory Visit (INDEPENDENT_AMBULATORY_CARE_PROVIDER_SITE_OTHER): Payer: Self-pay | Admitting: General Surgery

## 2013-10-04 ENCOUNTER — Telehealth (INDEPENDENT_AMBULATORY_CARE_PROVIDER_SITE_OTHER): Payer: Self-pay | Admitting: General Surgery

## 2013-10-04 NOTE — Telephone Encounter (Signed)
I called pt to schedule her surgery. She is not feeling well and did not want to proceed with scheduling at this time. She will call back when ready. Placed in pending. skm

## 2013-10-18 ENCOUNTER — Ambulatory Visit (INDEPENDENT_AMBULATORY_CARE_PROVIDER_SITE_OTHER): Payer: Medicare Other | Admitting: Cardiology

## 2013-10-18 ENCOUNTER — Other Ambulatory Visit: Payer: Self-pay | Admitting: *Deleted

## 2013-10-18 ENCOUNTER — Encounter: Payer: Self-pay | Admitting: Cardiology

## 2013-10-18 VITALS — BP 130/92 | HR 68 | Ht 67.0 in | Wt 175.0 lb

## 2013-10-18 DIAGNOSIS — I359 Nonrheumatic aortic valve disorder, unspecified: Secondary | ICD-10-CM

## 2013-10-18 DIAGNOSIS — I723 Aneurysm of iliac artery: Secondary | ICD-10-CM

## 2013-10-18 NOTE — Assessment & Plan Note (Signed)
Only minimal dilatation on most recent CT.

## 2013-10-18 NOTE — Assessment & Plan Note (Signed)
Continue aspirin. Intolerant to statins. 

## 2013-10-18 NOTE — Patient Instructions (Signed)
Your physician wants you to follow-up in: 6 MONTHS WITH DR Jens Som IN [place] You will receive a reminder letter in the mail two months in advance. If you don't receive a letter, please call our office to schedule the follow-up appointment.   Your physician has requested that you have an echocardiogram. Echocardiography is a painless test that uses sound waves to create images of your heart. It provides your doctor with information about the size and shape of your heart and how well your heart's chambers and valves are working. This procedure takes approximately one hour. There are no restrictions for this procedure.IN 6 MONTHS PRIOR TO FOLLOW UP APPOINTMENT

## 2013-10-18 NOTE — Assessment & Plan Note (Signed)
Blood pressure typically controlled at home by patient's report. Continue present medications.

## 2013-10-18 NOTE — Assessment & Plan Note (Signed)
Continue SBE prophylaxis. Repeat echocardiogram in 6 months.

## 2013-10-18 NOTE — Assessment & Plan Note (Signed)
Not present on most recent CT.

## 2013-10-18 NOTE — Progress Notes (Signed)
HPI: Laurie [name] is a pleasant female who has a history of aortic stenosis status post aortic valve replacement in September 2009. She had a pericardial tissue valve. She also had a septal myectomy and a right middle lobectomy due to lung CA. Note, she has a 50% mid LAD by catheterization preoperatively. Last echocardiogram in September of 2013 showed an ejection fraction of 65%. The mean gradient across the aortic valve was 21 mm of mercury but the valve was not well visualized. Abdominal CT in in July of 2014 showed stable mild dilatation of the infrarenal aorta.Followup carotid Dopplers in Nov 2014 revealed a 40-59% left and 1-39% right stenosis. FU recommended in one year. I last saw her in March of 2014. Since then, she denies dyspnea, chest pain or syncope.   Current Outpatient Prescriptions  Medication Sig Dispense Refill  . acetaminophen (TYLENOL) 500 MG tablet Take 500 mg by mouth every 6 (six) hours as needed. Pain        . ALPRAZolam (XANAX) 0.5 MG tablet TAKE 1 TABLET BY MOUTH TWICE DAILY  60 tablet  0  . aspirin 81 MG tablet Take 81 mg by mouth daily.        . clobetasol (OLUX) 0.05 % topical foam Apply topically daily as needed.  50 g  0  . hydrochlorothiazide (MICROZIDE) 12.5 MG capsule Take 1 capsule (12.5 mg total) by mouth daily.  90 capsule  3  . lisinopril (PRINIVIL,ZESTRIL) 40 MG tablet TAKE 1 TABLET BY MOUTH EVERY DAY  90 tablet  3  . metoprolol (LOPRESSOR) 50 MG tablet TAKE 1 TABLET BY MOUTH TWO TIMES A DAY  180 tablet  0   No current facility-administered medications for this visit.     Past Medical History  Diagnosis Date  . Anemia     iron deficiency  . Shingles 11-09  . COPD (chronic obstructive pulmonary disease) 2005    on CXR with R apical scaring  . Atrial fibrillation     post op  . Hypertension   . Cerebrovascular disease   . S/P aortic valve replacement 08/28/2008    [facility]  . S/P lobectomy of lung 08/28/2008    rt middle lobe  dr. Cornelius Moras  .  Carcinoma of lung 08/28/2008    T2N0 moderately diff. squamous cell CA  . AAA (abdominal aortic aneurysm)   . CAD (coronary artery disease)     Past Surgical History  Procedure Laterality Date  . Aapy    . Tonsillectomy and adenoidectomy    . U/s guided aspiration of r breast cyst   2009    [facility]  . Rml removed dr Barry Dienes for lung cancer  08/28/2008    T2N0 squamous cell CA  . Cardiac valve replacement    . Abdominal surgery    . Tonsillectomy    . Aortic valve replacement  08/28/2008    #68mm [facility] Ease pericardial tissue valve  . Cardiac valve surgery  08/28/2008    septal myomectomy    History   Social History  . Marital Status: Single    Spouse Name: N/A    Number of Children: N/A  . Years of Education: N/A   Occupational History  . Not on file.   Social History Main Topics  . Smoking status: Former Smoker -- 40 years  . Smokeless tobacco: Never Used  . Alcohol Use: No  . Drug Use: No  . Sexual Activity: Not on file  Other Topics Concern  . Not on file   Social History Narrative  . No narrative on file    ROS: no fevers or chills, productive cough, hemoptysis, dysphasia, odynophagia, melena, hematochezia, dysuria, hematuria, rash, seizure activity, orthopnea, PND, pedal edema, claudication. Remaining systems are negative.  Physical Exam: Well-developed well-nourished in no acute distress.  Skin is warm and dry.  HEENT is normal.  Neck is supple.  Chest is clear to auscultation with normal expansion.  Cardiovascular exam is regular rate and rhythm. 2/6 systolic murmur left sternal border. No diastolic murmur. Abdominal exam nontender or distended. No masses palpated. Extremities show no edema. neuro grossly intact  ECG sinus rhythm at a rate of 68. Left ventricular hypertrophy with repolarization abnormality.

## 2013-10-18 NOTE — Assessment & Plan Note (Signed)
Continue aspirin. Repeat carotid Dopplers November 2015. 

## 2013-10-18 NOTE — Assessment & Plan Note (Signed)
Continue diet. Intolerant to statins. 

## 2013-10-19 ENCOUNTER — Other Ambulatory Visit: Payer: Self-pay | Admitting: Family Medicine

## 2013-11-15 ENCOUNTER — Other Ambulatory Visit: Payer: Self-pay | Admitting: Family Medicine

## 2013-11-27 ENCOUNTER — Other Ambulatory Visit: Payer: Self-pay | Admitting: Family Medicine

## 2013-12-07 ENCOUNTER — Encounter (HOSPITAL_COMMUNITY): Payer: Self-pay

## 2013-12-11 ENCOUNTER — Encounter (HOSPITAL_COMMUNITY): Payer: Self-pay

## 2013-12-11 ENCOUNTER — Encounter (HOSPITAL_COMMUNITY)
Admission: RE | Admit: 2013-12-11 | Discharge: 2013-12-11 | Disposition: A | Discharge status: Home / Self Care | Payer: Medicare Other | Source: Ambulatory Visit | Attending: General Surgery | Admitting: General Surgery

## 2013-12-11 ENCOUNTER — Telehealth (INDEPENDENT_AMBULATORY_CARE_PROVIDER_SITE_OTHER): Payer: Self-pay

## 2013-12-11 HISTORY — DX: Pure hypercholesterolemia, unspecified: E78.00

## 2013-12-11 HISTORY — DX: Headache: R51

## 2013-12-11 LAB — BASIC METABOLIC PANEL
BUN: 9 mg/dL (ref 6–23)
CALCIUM: 9.4 mg/dL (ref 8.4–10.5)
CO2: 26 meq/L (ref 19–32)
Chloride: 89 mEq/L — ABNORMAL LOW (ref 96–112)
Creatinine, Ser: 0.64 mg/dL (ref 0.50–1.10)
GFR calc Af Amer: >90 mL/min (ref >90)
GFR calc non Af Amer: 85 mL/min — ABNORMAL LOW (ref >90)
GLUCOSE: 93 mg/dL (ref 70–99)
Potassium: 4 mEq/L (ref 3.7–5.3)
SODIUM: 129 meq/L — AB (ref 137–147)

## 2013-12-11 LAB — CBC
HEMATOCRIT: 44.5 % (ref 36.0–46.0)
HEMOGLOBIN: 15.2 g/dL — AB (ref 12.0–15.0)
MCH: 29.6 pg (ref 26.0–34.0)
MCHC: 34.2 g/dL (ref 30.0–36.0)
MCV: 86.6 fL (ref 78.0–100.0)
Platelets: 364 10*3/uL (ref 150–400)
RBC: 5.14 MIL/uL — ABNORMAL HIGH (ref 3.87–5.11)
RDW: 12.9 % (ref 11.5–15.5)
WBC: 11.1 10*3/uL — ABNORMAL HIGH (ref 4.0–10.5)

## 2013-12-11 NOTE — Telephone Encounter (Signed)
Cardiac clearance faxed to [phone].

## 2013-12-11 NOTE — Pre-Procedure Instructions (Signed)
Laurie [name]  12/11/2013   Your procedure is scheduled on:  Wednesday, December 13, 2013 @ 8:30 AM  Report to [facility] Short Stay (use Main Entrance "A'') at 6:30 AM.  Call this number if you have problems the morning of surgery: [phone]   Remember:   Do not eat food or drink liquids after midnight.   Take these medicines the morning of surgery with A SIP OF WATER: metoprolol (LOPRESSOR) 50 MG tablet,  if needed:ALPRAZolam (XANAX) 0.5 MG tablet for anxiety, acetaminophen (TYLENOL) 500 MG tablet for pain  Do not wear jewelry, make-up or nail polish.  Do not wear lotions, powders, or perfumes. You may NOT wear deodorant.  Do not shave 48 hours prior to surgery. Men may shave face and neck.  Do not bring valuables to the hospital.  [facility] is not responsible  for any belongings or valuables.               Contacts, dentures or bridgework may not be worn into surgery.  Leave suitcase in the car. After surgery it may be brought to your room.  For patients admitted to the hospital, discharge time is determined by your treatment team.               Patients discharged the day of surgery will not be allowed to drive home.  Name and phone number of your driver:   Special Instructions: Shower using CHG 2 nights before surgery and the night before surgery.  If you shower the day of surgery use CHG.  Use special wash - you have one bottle of CHG for all showers.  You should use approximately 1/3 of the bottle for each shower.   Please read over the following fact sheets that you were given: Pain Booklet, Coughing and Deep Breathing and Surgical Site Infection Prevention

## 2013-12-12 MED ORDER — CEFAZOLIN SODIUM-DEXTROSE 2-3 GM-% IV SOLR
2.0000 g | INTRAVENOUS | Status: AC
  Administered 2013-12-13: 2 g via INTRAVENOUS
  Filled 2013-12-12: qty 50

## 2013-12-12 NOTE — Progress Notes (Signed)
Anesthesia Chart Review:  Patient is a 79 year old female scheduled for laparoscopic cholecystectomy on 12/13/13 by Dr. Rosendo Gros.  History includes former smoker, COPD, iron deficiency anemia, non-obstructive CAD by cath '09, aortic stenosis s/p AVR with pericardial tissue valve (recurrent mild to moderate AS by 07/2012 echo) combined with septal myectomy and RM lobectomy due to T2 N0 stage 1b SCC lung CA '09 (Dr.[name]), LLL lung nodule stable since 2012 (by 07/2013 CT), post-operative afib '09, HTN, AAA (3 cm by CTA 05/2013), hypercholesterolemia, carotid artery stenosis (40-59% left, 1-39% right 09/2013). Cardiologist is Dr. Stanford Breed who felt patient was ok for cholecystectomy.  Echo on 08/10/12 showed: - Left ventricle: The parasternal images are very limited. It can be seen well in the apical images that there is excellent LV function. Wall thickness was increased in a pattern of moderate LVH. The estimated ejection fraction was 65%. Wall motion was normal; there were no regional wall motion abnormalities. - Aortic valve: There is a gradient that seems to be from the aortic valve, although the valve is not seen well. There is mild/moderate AS. Poorly visualized. Mean gradient: 46mm Hg (S). Peak gradient: 4mm Hg (S). - Mitral valve: The valve appears to be grossly normal.  Cardiac cath on 08/03/08 showed: 1. Severe aortic stenosis.  2. Minor coronary plaquing without significant obstruction (< 50% distal LAD).  3. No significant aortic regurgitation with mild dilatation of the aortic root.   EKG on 10/18/13 showed NSR, LVH with repolarization abnormality (negative lateral T waves), cannot rule out septal infarct (age undetermined).  CXR on 12/28/12 showed: Prior median sternotomy and valve replacement. Stable hyperinflation of the lungs. Postoperative changes in the right lung. Stable blunting of the right costophrenic angle, likely scarring. Biapical scarring. No acute opacities or effusions. No acute bony  abnormality.  Preoperative labs noted.  Na 129.  She is on HCTZ which is likely contributing.  Check ISTAT on arrival.  If Na is stable and otherwise no acute changes then plan to proceed.  Myra Gianotti, PA-C [facility] Short Stay Center/Anesthesiology Phone [phone] 12/12/2013 10:00 AM

## 2013-12-13 ENCOUNTER — Encounter (HOSPITAL_COMMUNITY): Payer: Medicare Other | Admitting: Vascular Surgery

## 2013-12-13 ENCOUNTER — Encounter (HOSPITAL_COMMUNITY)
Admission: RE | Disposition: A | Discharge status: Home / Self Care | Payer: Self-pay | Source: Ambulatory Visit | Attending: General Surgery

## 2013-12-13 ENCOUNTER — Ambulatory Visit (HOSPITAL_COMMUNITY): Payer: Medicare Other | Admitting: Anesthesiology

## 2013-12-13 ENCOUNTER — Ambulatory Visit (HOSPITAL_COMMUNITY)
Admission: RE | Admit: 2013-12-13 | Discharge: 2013-12-13 | Disposition: A | Discharge status: Home / Self Care | Payer: Medicare Other | Source: Ambulatory Visit | Attending: General Surgery | Admitting: General Surgery

## 2013-12-13 ENCOUNTER — Encounter (HOSPITAL_COMMUNITY): Payer: Self-pay | Admitting: *Deleted

## 2013-12-13 ENCOUNTER — Emergency Department (HOSPITAL_BASED_OUTPATIENT_CLINIC_OR_DEPARTMENT_OTHER)
Admission: EM | Admit: 2013-12-13 | Discharge: 2013-12-14 | Disposition: A | Discharge status: Home / Self Care | Payer: Medicare Other | Attending: Emergency Medicine | Admitting: Emergency Medicine

## 2013-12-13 ENCOUNTER — Telehealth (INDEPENDENT_AMBULATORY_CARE_PROVIDER_SITE_OTHER): Payer: Self-pay

## 2013-12-13 ENCOUNTER — Encounter (HOSPITAL_BASED_OUTPATIENT_CLINIC_OR_DEPARTMENT_OTHER): Payer: Self-pay | Admitting: Emergency Medicine

## 2013-12-13 DIAGNOSIS — K8010 Calculus of gallbladder with chronic cholecystitis without obstruction: Secondary | ICD-10-CM

## 2013-12-13 DIAGNOSIS — Z7982 Long term (current) use of aspirin: Secondary | ICD-10-CM | POA: Insufficient documentation

## 2013-12-13 DIAGNOSIS — Y838 Other surgical procedures as the cause of abnormal reaction of the patient, or of later complication, without mention of misadventure at the time of the procedure: Secondary | ICD-10-CM | POA: Insufficient documentation

## 2013-12-13 DIAGNOSIS — I2510 Atherosclerotic heart disease of native coronary artery without angina pectoris: Secondary | ICD-10-CM | POA: Insufficient documentation

## 2013-12-13 DIAGNOSIS — Z01812 Encounter for preprocedural laboratory examination: Secondary | ICD-10-CM | POA: Insufficient documentation

## 2013-12-13 DIAGNOSIS — I10 Essential (primary) hypertension: Secondary | ICD-10-CM | POA: Insufficient documentation

## 2013-12-13 DIAGNOSIS — Z862 Personal history of diseases of the blood and blood-forming organs and certain disorders involving the immune mechanism: Secondary | ICD-10-CM | POA: Insufficient documentation

## 2013-12-13 DIAGNOSIS — Z87891 Personal history of nicotine dependence: Secondary | ICD-10-CM | POA: Insufficient documentation

## 2013-12-13 DIAGNOSIS — Z954 Presence of other heart-valve replacement: Secondary | ICD-10-CM | POA: Insufficient documentation

## 2013-12-13 DIAGNOSIS — J4489 Other specified chronic obstructive pulmonary disease: Secondary | ICD-10-CM | POA: Insufficient documentation

## 2013-12-13 DIAGNOSIS — J449 Chronic obstructive pulmonary disease, unspecified: Secondary | ICD-10-CM | POA: Insufficient documentation

## 2013-12-13 DIAGNOSIS — Z9889 Other specified postprocedural states: Secondary | ICD-10-CM | POA: Insufficient documentation

## 2013-12-13 DIAGNOSIS — T148XXA Other injury of unspecified body region, initial encounter: Secondary | ICD-10-CM

## 2013-12-13 DIAGNOSIS — IMO0002 Reserved for concepts with insufficient information to code with codable children: Secondary | ICD-10-CM | POA: Insufficient documentation

## 2013-12-13 DIAGNOSIS — Z8639 Personal history of other endocrine, nutritional and metabolic disease: Secondary | ICD-10-CM | POA: Insufficient documentation

## 2013-12-13 DIAGNOSIS — Z85118 Personal history of other malignant neoplasm of bronchus and lung: Secondary | ICD-10-CM | POA: Insufficient documentation

## 2013-12-13 DIAGNOSIS — Z9089 Acquired absence of other organs: Secondary | ICD-10-CM | POA: Insufficient documentation

## 2013-12-13 DIAGNOSIS — Z8673 Personal history of transient ischemic attack (TIA), and cerebral infarction without residual deficits: Secondary | ICD-10-CM | POA: Insufficient documentation

## 2013-12-13 DIAGNOSIS — Z8619 Personal history of other infectious and parasitic diseases: Secondary | ICD-10-CM | POA: Insufficient documentation

## 2013-12-13 DIAGNOSIS — I739 Peripheral vascular disease, unspecified: Secondary | ICD-10-CM | POA: Insufficient documentation

## 2013-12-13 DIAGNOSIS — Z79899 Other long term (current) drug therapy: Secondary | ICD-10-CM | POA: Insufficient documentation

## 2013-12-13 HISTORY — PX: CHOLECYSTECTOMY: SHX55

## 2013-12-13 LAB — POCT I-STAT 4, (NA,K, GLUC, HGB,HCT)
Glucose, Bld: 116 mg/dL — ABNORMAL HIGH (ref 70–99)
HEMATOCRIT: 44 % (ref 36.0–46.0)
Hemoglobin: 15 g/dL (ref 12.0–15.0)
Potassium: 3.7 mEq/L (ref 3.7–5.3)
Sodium: 132 mEq/L — ABNORMAL LOW (ref 137–147)

## 2013-12-13 LAB — GLUCOSE, CAPILLARY: Glucose-Capillary: 118 mg/dL — ABNORMAL HIGH (ref 70–99)

## 2013-12-13 SURGERY — LAPAROSCOPIC CHOLECYSTECTOMY
Anesthesia: General | Site: Abdomen

## 2013-12-13 MED ORDER — ONDANSETRON HCL 4 MG/2ML IJ SOLN: 4.0000 mg | Freq: Four times a day (QID) | INTRAMUSCULAR | Status: DC | PRN

## 2013-12-13 MED ORDER — CEFAZOLIN SODIUM-DEXTROSE 2-3 GM-% IV SOLR: 2.0000 g | INTRAVENOUS | Status: DC

## 2013-12-13 MED ORDER — MIDAZOLAM HCL 5 MG/5ML IJ SOLN
INTRAMUSCULAR | Status: DC | PRN
  Administered 2013-12-13 (×2): 1 mg via INTRAVENOUS

## 2013-12-13 MED ORDER — OXYCODONE HCL 5 MG/5ML PO SOLN: 5.0000 mg | Freq: Once | ORAL | Status: AC | PRN

## 2013-12-13 MED ORDER — GLYCOPYRROLATE 0.2 MG/ML IJ SOLN
INTRAMUSCULAR | Status: DC | PRN
  Administered 2013-12-13: 0.6 mg via INTRAVENOUS

## 2013-12-13 MED ORDER — ACETAMINOPHEN 325 MG PO TABS: 650.0000 mg | ORAL_TABLET | ORAL | Status: DC | PRN

## 2013-12-13 MED ORDER — HYDROMORPHONE HCL PF 1 MG/ML IJ SOLN
0.2500 mg | INTRAMUSCULAR | Status: DC | PRN
  Administered 2013-12-13: 0.5 mg via INTRAVENOUS
  Administered 2013-12-13 (×2): 0.25 mg via INTRAVENOUS

## 2013-12-13 MED ORDER — SILVER NITRATE-POT NITRATE 75-25 % EX MISC
2.0000 | Freq: Once | CUTANEOUS | Status: AC
  Administered 2013-12-13: 2 via TOPICAL

## 2013-12-13 MED ORDER — HYDROMORPHONE HCL PF 1 MG/ML IJ SOLN
INTRAMUSCULAR | Status: AC
  Administered 2013-12-13: 0.25 mg via INTRAVENOUS
  Filled 2013-12-13: qty 1

## 2013-12-13 MED ORDER — SODIUM CHLORIDE 0.9 % IJ SOLN: 3.0000 mL | INTRAMUSCULAR | Status: DC | PRN

## 2013-12-13 MED ORDER — CHLORHEXIDINE GLUCONATE 4 % EX LIQD: 1.0000 "application " | Freq: Once | CUTANEOUS | Status: DC

## 2013-12-13 MED ORDER — HYDROCORTISONE 1 % EX CREA: TOPICAL_CREAM | CUTANEOUS | Status: DC

## 2013-12-13 MED ORDER — BUPIVACAINE HCL (PF) 0.25 % IJ SOLN
INTRAMUSCULAR | Status: AC
  Filled 2013-12-13: qty 30

## 2013-12-13 MED ORDER — EPHEDRINE SULFATE 50 MG/ML IJ SOLN
INTRAMUSCULAR | Status: DC | PRN
  Administered 2013-12-13: 5 mg via INTRAVENOUS

## 2013-12-13 MED ORDER — SILVER NITRATE-POT NITRATE 75-25 % EX MISC
CUTANEOUS | Status: AC
  Administered 2013-12-13: 23:00:00 2 via TOPICAL
  Filled 2013-12-13: qty 1

## 2013-12-13 MED ORDER — NEOSTIGMINE METHYLSULFATE 1 MG/ML IJ SOLN
INTRAMUSCULAR | Status: DC | PRN
  Administered 2013-12-13: 4 mg via INTRAVENOUS

## 2013-12-13 MED ORDER — PROPOFOL 10 MG/ML IV BOLUS
INTRAVENOUS | Status: DC | PRN
  Administered 2013-12-13: 100 mg via INTRAVENOUS

## 2013-12-13 MED ORDER — SODIUM CHLORIDE 0.9 % IV SOLN: 250.0000 mL | INTRAVENOUS | Status: DC | PRN

## 2013-12-13 MED ORDER — OXYCODONE HCL 5 MG PO TABS
ORAL_TABLET | ORAL | Status: AC
Start: 2013-12-13 — End: 2013-12-13
  Administered 2013-12-13: 5 mg via ORAL
  Filled 2013-12-13: qty 1

## 2013-12-13 MED ORDER — SILVER NITRATE-POT NITRATE 75-25 % EX MISC
CUTANEOUS | Status: AC
  Administered 2013-12-13: 23:00:00
  Filled 2013-12-13: qty 1

## 2013-12-13 MED ORDER — ONDANSETRON HCL 4 MG/2ML IJ SOLN: 4.0000 mg | Freq: Once | INTRAMUSCULAR | Status: DC | PRN

## 2013-12-13 MED ORDER — OXYCODONE HCL 5 MG PO TABS: 5.0000 mg | ORAL_TABLET | ORAL | Status: DC | PRN

## 2013-12-13 MED ORDER — FENTANYL CITRATE 0.05 MG/ML IJ SOLN
INTRAMUSCULAR | Status: DC | PRN
  Administered 2013-12-13: 100 ug via INTRAVENOUS

## 2013-12-13 MED ORDER — SODIUM CHLORIDE 0.9 % IJ SOLN: 3.0000 mL | Freq: Two times a day (BID) | INTRAMUSCULAR | Status: DC

## 2013-12-13 MED ORDER — OXYCODONE-ACETAMINOPHEN 5-325 MG PO TABS
1.0000 | ORAL_TABLET | ORAL | Status: DC | PRN
Start: 2013-12-13 — End: 2014-03-20

## 2013-12-13 MED ORDER — ROCURONIUM BROMIDE 100 MG/10ML IV SOLN
INTRAVENOUS | Status: DC | PRN
  Administered 2013-12-13: 50 mg via INTRAVENOUS

## 2013-12-13 MED ORDER — SODIUM CHLORIDE 0.9 % IR SOLN
Status: DC | PRN
  Administered 2013-12-13 (×2): 1000 mL

## 2013-12-13 MED ORDER — DIPHENHYDRAMINE HCL 25 MG PO CAPS: 25.0000 mg | ORAL_CAPSULE | Freq: Four times a day (QID) | ORAL | Status: DC | PRN

## 2013-12-13 MED ORDER — MEPERIDINE HCL 25 MG/ML IJ SOLN: 6.2500 mg | INTRAMUSCULAR | Status: DC | PRN

## 2013-12-13 MED ORDER — BUPIVACAINE HCL 0.25 % IJ SOLN
INTRAMUSCULAR | Status: DC | PRN
  Administered 2013-12-13: 11 mL

## 2013-12-13 MED ORDER — ACETAMINOPHEN 650 MG RE SUPP: 650.0000 mg | RECTAL | Status: DC | PRN

## 2013-12-13 MED ORDER — PHENYLEPHRINE HCL 10 MG/ML IJ SOLN
INTRAMUSCULAR | Status: DC | PRN
  Administered 2013-12-13: 120 ug via INTRAVENOUS
  Administered 2013-12-13 (×3): 80 ug via INTRAVENOUS

## 2013-12-13 MED ORDER — LIDOCAINE HCL (CARDIAC) 20 MG/ML IV SOLN
INTRAVENOUS | Status: DC | PRN
  Administered 2013-12-13: 100 mg via INTRAVENOUS

## 2013-12-13 MED ORDER — LACTATED RINGERS IV SOLN
INTRAVENOUS | Status: DC | PRN
  Administered 2013-12-13 (×2): via INTRAVENOUS

## 2013-12-13 MED ORDER — OXYCODONE HCL 5 MG PO TABS
5.0000 mg | ORAL_TABLET | Freq: Once | ORAL | Status: AC | PRN
  Administered 2013-12-13: 5 mg via ORAL

## 2013-12-13 MED ORDER — ONDANSETRON HCL 4 MG/2ML IJ SOLN
INTRAMUSCULAR | Status: DC | PRN
  Administered 2013-12-13: 4 mg via INTRAVENOUS

## 2013-12-13 SURGICAL SUPPLY — 42 items
BENZOIN TINCTURE PRP APPL 2/3 (GAUZE/BANDAGES/DRESSINGS) ×3 IMPLANT
CANISTER SUCTION 2500CC (MISCELLANEOUS) ×3 IMPLANT
CHLORAPREP W/TINT 26ML (MISCELLANEOUS) ×3 IMPLANT
CLIP LIGATING HEMO O LOK GREEN (MISCELLANEOUS) ×6 IMPLANT
COVER MAYO STAND STRL (DRAPES) IMPLANT
COVER SURGICAL LIGHT HANDLE (MISCELLANEOUS) ×3 IMPLANT
COVER TRANSDUCER ULTRASND (DRAPES) ×3 IMPLANT
DEVICE TROCAR PUNCTURE CLOSURE (ENDOMECHANICALS) ×3 IMPLANT
DRAPE C-ARM 42X72 X-RAY (DRAPES) IMPLANT
DRAPE UTILITY 15X26 W/TAPE STR (DRAPE) ×6 IMPLANT
ELECT REM PT RETURN 9FT ADLT (ELECTROSURGICAL) ×3
ELECTRODE REM PT RTRN 9FT ADLT (ELECTROSURGICAL) ×1 IMPLANT
GAUZE SPONGE 2X2 8PLY STRL LF (GAUZE/BANDAGES/DRESSINGS) ×1 IMPLANT
GLOVE BIO SURGEON STRL SZ 6.5 (GLOVE) ×2 IMPLANT
GLOVE BIO SURGEON STRL SZ7 (GLOVE) ×3 IMPLANT
GLOVE BIO SURGEON STRL SZ7.5 (GLOVE) ×6 IMPLANT
GLOVE BIO SURGEONS STRL SZ 6.5 (GLOVE) ×1
GLOVE BIOGEL PI IND STRL 7.0 (GLOVE) ×1 IMPLANT
GLOVE BIOGEL PI IND STRL 7.5 (GLOVE) ×1 IMPLANT
GLOVE BIOGEL PI INDICATOR 7.0 (GLOVE) ×2
GLOVE BIOGEL PI INDICATOR 7.5 (GLOVE) ×2
GOWN STRL NON-REIN LRG LVL3 (GOWN DISPOSABLE) ×6 IMPLANT
GOWN STRL REIN XL XLG (GOWN DISPOSABLE) ×3 IMPLANT
IV CATH 14GX2 1/4 (CATHETERS) IMPLANT
KIT BASIN OR (CUSTOM PROCEDURE TRAY) ×3 IMPLANT
KIT ROOM TURNOVER OR (KITS) ×3 IMPLANT
NEEDLE INSUFFLATION 14GA 120MM (NEEDLE) ×3 IMPLANT
NS IRRIG 1000ML POUR BTL (IV SOLUTION) ×3 IMPLANT
PAD ARMBOARD 7.5X6 YLW CONV (MISCELLANEOUS) ×6 IMPLANT
POUCH SPECIMEN RETRIEVAL 10MM (ENDOMECHANICALS) IMPLANT
SCISSORS LAP 5X35 DISP (ENDOMECHANICALS) ×3 IMPLANT
SET CHOLANGIOGRAPHY FRANKLIN (SET/KITS/TRAYS/PACK) IMPLANT
SET IRRIG TUBING LAPAROSCOPIC (IRRIGATION / IRRIGATOR) ×3 IMPLANT
SLEEVE ENDOPATH XCEL 5M (ENDOMECHANICALS) ×6 IMPLANT
SPECIMEN JAR SMALL (MISCELLANEOUS) ×3 IMPLANT
SPONGE GAUZE 2X2 STER 10/PKG (GAUZE/BANDAGES/DRESSINGS) ×2
SUT MNCRL AB 3-0 PS2 18 (SUTURE) ×3 IMPLANT
TOWEL OR 17X24 6PK STRL BLUE (TOWEL DISPOSABLE) ×3 IMPLANT
TOWEL OR 17X26 10 PK STRL BLUE (TOWEL DISPOSABLE) ×3 IMPLANT
TRAY LAPAROSCOPIC (CUSTOM PROCEDURE TRAY) ×3 IMPLANT
TROCAR XCEL NON-BLD 11X100MML (ENDOMECHANICALS) ×3 IMPLANT
TROCAR XCEL NON-BLD 5MMX100MML (ENDOMECHANICALS) ×3 IMPLANT

## 2013-12-13 NOTE — Transfer of Care (Signed)
Immediate Anesthesia Transfer of Care Note  Patient: Laurie [name]  Procedure(s) Performed: Procedure(s): LAPAROSCOPIC CHOLECYSTECTOMY (N/A)  Patient Location: PACU  Anesthesia Type:General  Level of Consciousness: awake, alert  and oriented  Airway & Oxygen Therapy: Patient Spontanous Breathing and Patient connected to nasal cannula oxygen  Post-op Assessment: Report given to PACU RN, Post -op Vital signs reviewed and stable and Patient moving all extremities X 4  Post vital signs: Reviewed and stable  Complications: No apparent anesthesia complications

## 2013-12-13 NOTE — Discharge Instructions (Signed)
Laparoscopic Cholecystectomy, Care After Refer to this sheet in the next few weeks. These instructions provide you with information on caring for yourself after your procedure. Your health care provider may also give you more specific instructions. Your treatment has been planned according to current medical practices, but problems sometimes occur. Call your health care provider if you have any problems or questions after your procedure. WHAT TO EXPECT AFTER THE PROCEDURE After your procedure, it is typical to have the following:  Pain at your incision sites. You will be given pain medicines to control the pain.  Mild nausea or vomiting. This should improve after the first 24 hours.  Bloating and possibly shoulder pain from the gas used during the procedure. This will improve after the first 24 hours. HOME CARE INSTRUCTIONS   Change bandages (dressings) as directed by your health care provider.  Keep the wound dry and clean. You may wash the wound gently with soap and water. Gently blot or dab the area dry.  Do not take baths or use swimming pools or hot tubs for 2 weeks or until your health care provider approves.  Only take over-the-counter or prescription medicines as directed by your health care provider.  Continue your normal diet as directed by your health care provider.  Do not lift anything heavier than 10 pounds (4.5 kg) until your health care provider approves.  Do not play contact sports for 1 week or until your health care provider approves. SEEK MEDICAL CARE IF:   You have redness, swelling, or increasing pain in the wound.  You notice yellowish-white fluid (pus) coming from the wound.  You have drainage from the wound that lasts longer than 1 day.  You notice a bad smell coming from the wound or dressing.  Your surgical cuts (incisions) break open. SEEK IMMEDIATE MEDICAL CARE IF:   You develop a rash.  You have difficulty breathing.  You have chest pain.  You  have a fever.  You have increasing pain in the shoulders (shoulder strap areas).  You have dizzy episodes or faint while standing.  You have severe abdominal pain.  You feel sick to your stomach (nauseous) or throw up (vomit) and this lasts for more than 1 day. Document Released: 11/16/2005 Document Revised: 09/06/2013 Document Reviewed: 06/28/2013 [facility] Patient Information 2014 [place].

## 2013-12-13 NOTE — Op Note (Signed)
12/13/2013  9:15 AM  PATIENT:  Laurie [name]  79 y.o. female  PRE-OPERATIVE DIAGNOSIS:  biliary dyskenesia  POST-OPERATIVE DIAGNOSIS:  biliary dyskenesia  PROCEDURE:  Procedure(s): LAPAROSCOPIC CHOLECYSTECTOMY (N/A)  SURGEON:  Surgeon(s) and Role:    * Ralene Ok, MD - Primary  PHYSICIAN ASSISTANT:   ASSISTANTS: none   ANESTHESIA:   general  EBL:   <5cc  BLOOD ADMINISTERED:none  DRAINS: none   LOCAL MEDICATIONS USED:  MARCAINE     SPECIMEN:  Source of Specimen:  gallbladder  DISPOSITION OF SPECIMEN:  PATHOLOGY  COUNTS:  YES  TOURNIQUET:  * No tourniquets in log *  DICTATION: .Dragon Dictation  Indications for procedure: 79 y/o F with RUQ pain and an abnormal HIDA scan.  She was counseled and decided to have her gallbladder removed.  Details of the procedure:  The patient was taken to the operating and placed in the supine position with bilateral SCDs in place. A time out was called and all facts were verified. A pneumoperitoneum was obtained via A Veress needle technique to a pressure of 10mm of mercury. A 23mm trochar was then placed in the right upper quadrant under visualization, and there were no injuries to any abdominal organs. A 11 mm port was then placed in the umbilical region after infiltrating with local anesthesia under direct visualization. A second and third epigastric port and right lower quadrant port placement under direct visualization, respectively. The gallbladder was identified and retracted, the peritoneum was then sharply dissected from the gallbladder and this dissection was carried down to Calot's triangle. The gallbladder was identified and stripped away circumferentially and seen going into the gallbladder 360, the critical angle was obtained.  2 clips were placed proximally one distally and the cystic duct transected. The cystic artery was identified and 2 clips placed proximally and one distally and transected.  We then proceeded to  remove the gallbladder off the hepatic fossa with Bovie cautery. A latex retrieval bag was then placed in the abdomen and gallbladder placed in the bag. The hepatic fossa was then reexamined and hemostasis was achieved with Bovie cautery and was excellent at the end of the case. The subhepatic fossa and perihepatic fossa was then irrigated until the effluent was clear. The 11 mm trocar fascia was reapproximated with the Endo Close #1 Vicryl.  The pneumoperitoneum was evacuated and all trochars removed under direct visulalization.  The skin was then closed with 4-0 Monocryl and the skin dressed with Steri-Strips, gauze, and tape.  The patient was awaken from general anesthesia and taken to the recovery room in stable condition.   PLAN OF CARE: Discharge to home after PACU  PATIENT DISPOSITION:  PACU - hemodynamically stable.   Delay start of Pharmacological VTE agent (>24hrs) due to surgical blood loss or risk of bleeding: not applicable

## 2013-12-13 NOTE — ED Notes (Signed)
MD at bedside. 

## 2013-12-13 NOTE — Preoperative (Signed)
Beta Blockers   Reason not to administer Beta Blockers:Not Applicable 

## 2013-12-13 NOTE — ED Provider Notes (Signed)
CSN: [id number]     Arrival date & time 12/13/13  2131 History  This chart was scribed for No att. providers found by Eugenia Mcalpine, ED Scribe. This patient was seen in room MH06/MH06 and the patient's care was started at 9:51 PM.    Chief Complaint  Patient presents with  . Abdominal Pain   (Consider location/radiation/quality/duration/timing/severity/associated sxs/prior Treatment) HPI HPI Comments: Laurie [name] is a 79 y.o. female who presents to the Emergency Department complaining of epigastric bleeding post laparo scopic cholecystectomy. Pt tried to stop the bleeding at home but failed. She was advised by her surgeon to come to the ED.  Pt is currently not on any blood thinner.    Past Medical History  Diagnosis Date  . Anemia     iron deficiency  . Shingles 11-09  . COPD (chronic obstructive pulmonary disease) 2005    on CXR with R apical scaring  . Atrial fibrillation     post op  . Hypertension   . Cerebrovascular disease   . S/P aortic valve replacement 08/28/2008    [facility]  . S/P lobectomy of lung 08/28/2008    rt middle lobe  dr. Roxy Manns  . Carcinoma of lung 08/28/2008    T2N0 moderately diff. squamous cell CA  . AAA (abdominal aortic aneurysm)   . CAD (coronary artery disease)   . Hypercholesterolemia     Hx: of  . Headache(784.0)     Hx: of migraines in the past   Past Surgical History  Procedure Laterality Date  . Aapy    . Tonsillectomy and adenoidectomy    . U/s guided aspiration of r breast cyst   2009    [facility]  . Rml removed dr Ricard Dillon for lung cancer  08/28/2008    T2N0 squamous cell CA  . Cardiac valve replacement    . Abdominal surgery    . Tonsillectomy    . Aortic valve replacement  08/28/2008    #29mm [facility] Ease pericardial tissue valve  . Cardiac valve surgery  08/28/2008    septal myomectomy  . Colonoscopy      Hx: of  . Cataract extraction w/ intraocular lens  implant, bilateral      Hx:of  . Dilation and  curettage of uterus    . Cardiac catheterization    . Appendectomy     Family History  Problem Relation Age of Onset  . Stroke Father   . Diabetes Son     brittle diabetes   History  Substance Use Topics  . Smoking status: Former Smoker -- 40 years  . Smokeless tobacco: Never Used  . Alcohol Use: No   OB History   Grav Para Term Preterm Abortions TAB SAB Ect Mult Living                 Review of Systems  Gastrointestinal: Positive for abdominal pain.  Skin: Positive for wound.  Neurological: Negative for dizziness.  Hematological: Does not bruise/bleed easily.  All other systems reviewed and are negative.    Allergies  Statins and Rofecoxib  Home Medications   Current Outpatient Rx  Name  Route  Sig  Dispense  Refill  . acetaminophen (TYLENOL) 500 MG tablet   Oral   Take 500 mg by mouth every 6 (six) hours as needed. Pain           . ALPRAZolam (XANAX) 0.5 MG tablet   Oral   Take 0.5 mg by mouth  2 (two) times daily as needed for anxiety.         Marland Kitchen aspirin 81 MG tablet   Oral   Take 81 mg by mouth daily.           . clobetasol (OLUX) 0.05 % topical foam   Topical   Apply topically daily as needed.   50 g   0   . hydrochlorothiazide (MICROZIDE) 12.5 MG capsule   Oral   Take 1 capsule (12.5 mg total) by mouth daily.   90 capsule   3   . lisinopril (PRINIVIL,ZESTRIL) 40 MG tablet   Oral   Take 40 mg by mouth daily.         . metoprolol (LOPRESSOR) 50 MG tablet   Oral   Take 50 mg by mouth 2 (two) times daily.         Marland Kitchen oxyCODONE-acetaminophen (ROXICET) 5-325 MG per tablet   Oral   Take 1-2 tablets by mouth every 4 (four) hours as needed for severe pain.   30 tablet   0    BP 123/63  Pulse 69  Temp(Src) 98.8 F (37.1 C) (Oral)  Resp 18  Ht 5\' 7"  (1.702 m)  Wt 170 lb (77.111 kg)  BMI 26.62 kg/m2  SpO2 94% Physical Exam  Nursing note and vitals reviewed. Constitutional: She is oriented to person, place, and time. She appears  well-developed and well-nourished. No distress.  HENT:  Head: Normocephalic and atraumatic.  Eyes: EOM are normal.  Neck: Neck supple. No tracheal deviation present.  Cardiovascular: Normal rate.   Pulmonary/Chest: Effort normal. No respiratory distress.  Abdominal:  Epigastric1/2 cm surgical incision slowly oozing dark red blood  Musculoskeletal: Normal range of motion.  Neurological: She is alert and oriented to person, place, and time.  Skin: Skin is warm and dry.  Psychiatric: She has a normal mood and affect. Her behavior is normal.    ED Course  Cauterization Date/Time: 12/14/2013 12:32 AM Performed by: Varney Biles Authorized by: Varney Biles Consent: Verbal consent obtained. Risks and benefits: risks, benefits and alternatives were discussed Consent given by: patient Patient identity confirmed: verbally with patient Time out: Immediately prior to procedure a "time out" was called to verify the correct patient, procedure, equipment, support staff and site/side marked as required. Local anesthesia used: no Patient sedated: no Patient tolerance: Patient tolerated the procedure well with no immediate complications. Comments: Silver nitrate sticks used to cauterize the surgical incisional bleed. Procedure successful.   (including critical care time)  DIAGNOSTIC STUDIES: Oxygen Saturation is 94% on RA, low by my interpretation.    COORDINATION OF CARE: 9:55 PM- Pt advised of plan for treatment including cautery and silver nitrate and pt agrees.    Labs Review Labs Reviewed - No data to display Imaging Review No results found.  EKG Interpretation   None       MDM  No diagnosis found.  Pt comes in with bleeding. Bleeding site is the surgical incision site. Very small area of bleeding, that was controlled with silver nitrate cautery.     Varney Biles, MD 12/14/13 [phone]

## 2013-12-13 NOTE — H&P (Signed)
HPI  Laurie [name] is a 79 y.o. female. The patient is a 79 year old female who is referred by Dr. Madilyn Fireman for an evaluation of gallbladder dysfunction/pain. Patient states she's had pain for the last several years. She states the right upper quadrant and feel is unchanged with meals. She has some minor associated nausea however no emesis. Patient underwent ultrasound which revealed sludge in the gallbladder and a mildly dilated CBD at approximately 1 cm. Patient's LFTs were within normal limits.  HPI  Past Medical History   Diagnosis  Date   .  Anemia      iron deficiency   .  Shingles  11-09   .  COPD (chronic obstructive pulmonary disease)  2005     on CXR with R apical scaring   .  Atrial fibrillation      post op   .  Hypertension    .  Cerebrovascular disease    .  S/P aortic valve replacement  08/28/2008     [facility]   .  S/P lobectomy of lung  08/28/2008     rt middle lobe dr. Roxy Manns   .  Carcinoma of lung  08/28/2008     T2N0 moderately diff. squamous cell CA   .  AAA (abdominal aortic aneurysm)    .  CAD (coronary artery disease)     Past Surgical History   Procedure  Laterality  Date   .  Aapy     .  Tonsillectomy and adenoidectomy     .  U/s guided aspiration of r breast cyst   2009     at the breast clinic   .  Rml removed dr Ricard Dillon for lung cancer   08/28/2008     T2N0 squamous cell CA   .  Cardiac valve replacement     .  Abdominal surgery     .  Tonsillectomy     .  Aortic valve replacement   08/28/2008     #42mm [facility] Ease pericardial tissue valve   .  Cardiac valve surgery   08/28/2008     septal myomectomy    Family History   Problem  Relation  Age of Onset   .  Stroke  Father    .  Diabetes  Son      brittle diabetes   Social History  History   Substance Use Topics   .  Smoking status:  Former Smoker -- 40 years   .  Smokeless tobacco:  Never Used   .  Alcohol Use:  No    Allergies   Allergen  Reactions   .  Statins      Muscle aches    .  Rofecoxib  Rash    Current Outpatient Prescriptions   Medication  Sig  Dispense  Refill   .  acetaminophen (TYLENOL) 500 MG tablet  Take 500 mg by mouth every 6 (six) hours as needed. Pain     .  ALPRAZolam (XANAX) 0.5 MG tablet  TAKE 1 TABLET BY MOUTH TWICE DAILY  60 tablet  0   .  aspirin 81 MG tablet  Take 81 mg by mouth daily.     .  clobetasol (OLUX) 0.05 % topical foam  Apply topically daily as needed.  50 g  0   .  hydrochlorothiazide (MICROZIDE) 12.5 MG capsule  Take 1 capsule (12.5 mg total) by mouth daily.  90 capsule  3   .  lisinopril (PRINIVIL,ZESTRIL) 40  MG tablet  TAKE 1 TABLET BY MOUTH EVERY DAY  90 tablet  3   .  metoprolol (LOPRESSOR) 50 MG tablet  TAKE 1 TABLET BY MOUTH TWO TIMES A DAY  180 tablet  0    No current facility-administered medications for this visit.   Review of Systems  Review of Systems  Constitutional: Negative.  HENT: Negative.  Respiratory: Negative.  Cardiovascular: Negative.  Gastrointestinal: Positive for abdominal pain.  Neurological: Negative.  Blood pressure 118/60, pulse 64, temperature 98 F (36.7 C), temperature source Temporal, resp. rate 14, height 5\' 7"  (1.702 m), weight 175 lb (79.379 kg).  Physical Exam  Physical Exam  Constitutional: She is oriented to person, place, and time. She appears well-developed and well-nourished.  HENT:  Head: Normocephalic and atraumatic.  Eyes: Conjunctivae and EOM are normal. Pupils are equal, round, and reactive to light.  Neck: Normal range of motion. Neck supple.  Cardiovascular: Normal rate, regular rhythm and normal heart sounds.  Pulmonary/Chest: Effort normal and breath sounds normal.  Abdominal: Soft. Bowel sounds are normal. There is tenderness in the right upper quadrant.  Musculoskeletal: Normal range of motion.  Neurological: She is alert and oriented to person, place, and time.  Skin: Skin is warm and dry.  Data Reviewed  Ultrasound revealed biliary sludge  Assessment   79 year old female with symptomatic cholelithiasis versus biliary dyskinesia.  Plan  1. We will proceed with Lap chole 2. All risks and benefits were discussed with the patient to generally include: infection, bleeding, possible need for post op ERCP, damage to the bile ducts, and bile leak. Alternatives were offered and described. All questions were answered and the patient voiced understanding of the procedure and wishes to proceed at this point with a laparoscopic cholecystectomy

## 2013-12-13 NOTE — Discharge Instructions (Signed)
Please keep the surgical wound covered. If the bleeding returns, apply firm pressure with gauze - and come to the ER or go to Surgeons clinic if the bleeding persists.

## 2013-12-13 NOTE — ED Notes (Signed)
C/o abd bleeding from inscision earlier today  Unable to stop bleeding

## 2013-12-13 NOTE — Anesthesia Procedure Notes (Signed)
Procedure Name: Intubation Date/Time: 12/13/2013 8:30 AM Performed by: Kyung Rudd Pre-anesthesia Checklist: Patient identified, Emergency Drugs available, Suction available, Patient being monitored and Timeout performed Patient Re-evaluated:Patient Re-evaluated prior to inductionOxygen Delivery Method: Circle system utilized Preoxygenation: Pre-oxygenation with 100% oxygen Intubation Type: IV induction Ventilation: Mask ventilation without difficulty Laryngoscope Size: Mac and 3 Grade View: Grade II Tube type: Oral Tube size: 7.0 mm Number of attempts: 1 Airway Equipment and Method: Stylet Placement Confirmation: ETT inserted through vocal cords under direct vision,  positive ETCO2 and breath sounds checked- equal and bilateral Secured at: 21 cm Tube secured with: Tape Dental Injury: Teeth and Oropharynx as per pre-operative assessment

## 2013-12-13 NOTE — Anesthesia Postprocedure Evaluation (Signed)
Anesthesia Post Note  Patient: Laurie [name]  Procedure(s) Performed: Procedure(s) (LRB): LAPAROSCOPIC CHOLECYSTECTOMY (N/A)  Anesthesia type: general  Patient location: PACU  Post pain: Pain level controlled  Post assessment: Patient's Cardiovascular Status Stable  Last Vitals:  Filed Vitals:   12/13/13 1101  BP: 147/67  Pulse: 56  Temp: 36.4 C  Resp: 16    Post vital signs: Reviewed and stable  Level of consciousness: sedated  Complications: No apparent anesthesia complications

## 2013-12-13 NOTE — Anesthesia Preprocedure Evaluation (Addendum)
Anesthesia Evaluation  Patient identified by MRN, date of birth, ID band Patient awake    Reviewed: Allergy & Precautions, H&P , NPO status , Patient's Chart, lab work & pertinent test results  Airway Mallampati: I TM Distance: >3 FB Neck ROM: Full    Dental  (+) Teeth Intact   Pulmonary COPDformer smoker,          Cardiovascular hypertension, Pt. on medications + CAD and + Peripheral Vascular Disease     Neuro/Psych    GI/Hepatic   Endo/Other    Renal/GU      Musculoskeletal   Abdominal   Peds  Hematology   Anesthesia Other Findings   Reproductive/Obstetrics                         Anesthesia Physical Anesthesia Plan  ASA: III  Anesthesia Plan: General   Post-op Pain Management:    Induction: Intravenous  Airway Management Planned: Oral ETT  Additional Equipment:   Intra-op Plan:   Post-operative Plan: Extubation in OR  Informed Consent: I have reviewed the patients History and Physical, chart, labs and discussed the procedure including the risks, benefits and alternatives for the proposed anesthesia with the patient or authorized representative who has indicated his/her understanding and acceptance.   Dental advisory given  Plan Discussed with: CRNA and Surgeon  Anesthesia Plan Comments:        Anesthesia Quick Evaluation

## 2013-12-13 NOTE — Telephone Encounter (Signed)
Patients mother states patient has a steri strep missing ,advised her to keep a dressing over the area and if the area starts to open up to call so the area can assessed.

## 2013-12-13 NOTE — ED Notes (Signed)
Pt had gallbladder surgery this am, reports being discharged around 1400 today. Daughter came home this pm and noted that one of the puncuture sites is bleeding continously, daughter held pressure called on call md who told her to come to er and have md put silver nitrate on wound

## 2013-12-14 ENCOUNTER — Telehealth (INDEPENDENT_AMBULATORY_CARE_PROVIDER_SITE_OTHER): Payer: Self-pay

## 2013-12-14 NOTE — Telephone Encounter (Signed)
Pt was called so we can ck on status per Dr Ethlyn Gallery request who spoke to pt on call last night. Pt states she did go to ED last night to have bleeding at port site treated. Pt states wd and dsg is still dry. Pt will call with any concerns.

## 2013-12-15 ENCOUNTER — Encounter (HOSPITAL_COMMUNITY): Payer: Self-pay | Admitting: General Surgery

## 2013-12-18 ENCOUNTER — Other Ambulatory Visit: Payer: Self-pay | Admitting: Family Medicine

## 2013-12-20 ENCOUNTER — Telehealth: Payer: Self-pay | Admitting: *Deleted

## 2013-12-20 NOTE — Telephone Encounter (Signed)
Pt called and wanted to know why dr.metheney refused her rx for xanax. I informed her that from our end it looks like this has been taken care of I asked her if the surgeon had filled this for her and she stated that he had not. Maryruth Eve, Lahoma Crocker  [facility] to check if [facility] had [facility] and spoke w/amanda she stated that this has not been filled. Gave refill request to pharmacist .Elouise Munroe  Called and informed pt of rx being sent.Audelia [place]

## 2013-12-26 ENCOUNTER — Telehealth: Payer: Self-pay | Admitting: *Deleted

## 2013-12-26 NOTE — Telephone Encounter (Signed)
Pt called and wanted an appt to see dr. Madilyn Fireman. Pt reports that the area where she had surgery is sore and she would like to come in for dr. Madilyn Fireman to check her out b/c she cannot make it to the surgeons office due to transportation. I told her that she really should f/u with the surgeon about this since they will have a better idea of what could be going on.  I instructed her to call their office. She stated that she really didn't want to go back to him however, she would call.Maryruth Eve, Lahoma Crocker

## 2013-12-27 ENCOUNTER — Telehealth: Payer: Self-pay | Admitting: *Deleted

## 2013-12-27 NOTE — Telephone Encounter (Signed)
Called and informed pt that her insurance will no longer cover xanax. She stated that she will pay out of pocket for xanax 0.5 mg.Raiquan Chandler, Lahoma Crocker

## 2013-12-28 ENCOUNTER — Encounter (INDEPENDENT_AMBULATORY_CARE_PROVIDER_SITE_OTHER): Payer: Medicare Other | Admitting: General Surgery

## 2014-01-25 ENCOUNTER — Other Ambulatory Visit: Payer: Self-pay | Admitting: Family Medicine

## 2014-02-07 ENCOUNTER — Other Ambulatory Visit: Payer: Self-pay | Admitting: Cardiology

## 2014-02-26 ENCOUNTER — Other Ambulatory Visit: Payer: Self-pay | Admitting: Physician Assistant

## 2014-02-28 ENCOUNTER — Other Ambulatory Visit: Payer: Self-pay | Admitting: Family Medicine

## 2014-02-28 NOTE — Telephone Encounter (Signed)
I do not see where we filled this before.  Looking back in notes seen a hospital note where said resume at discharge?  Please advise

## 2014-03-20 ENCOUNTER — Ambulatory Visit (INDEPENDENT_AMBULATORY_CARE_PROVIDER_SITE_OTHER): Payer: Medicare Other | Admitting: Family Medicine

## 2014-03-20 ENCOUNTER — Telehealth: Payer: Self-pay | Admitting: *Deleted

## 2014-03-20 ENCOUNTER — Encounter: Payer: Self-pay | Admitting: Family Medicine

## 2014-03-20 VITALS — BP 149/88 | HR 67 | Ht 67.0 in | Wt 169.0 lb

## 2014-03-20 DIAGNOSIS — H539 Unspecified visual disturbance: Secondary | ICD-10-CM

## 2014-03-20 DIAGNOSIS — H9201 Otalgia, right ear: Secondary | ICD-10-CM

## 2014-03-20 DIAGNOSIS — R1031 Right lower quadrant pain: Secondary | ICD-10-CM

## 2014-03-20 DIAGNOSIS — R1011 Right upper quadrant pain: Secondary | ICD-10-CM

## 2014-03-20 DIAGNOSIS — H9209 Otalgia, unspecified ear: Secondary | ICD-10-CM

## 2014-03-20 LAB — COMPLETE METABOLIC PANEL WITH GFR
ALT: 15 U/L (ref 0–35)
AST: 18 U/L (ref 0–37)
Albumin: 4.3 g/dL (ref 3.5–5.2)
Alkaline Phosphatase: 73 U/L (ref 39–117)
BILIRUBIN TOTAL: 0.8 mg/dL (ref 0.2–1.2)
BUN: 9 mg/dL (ref 6–23)
CALCIUM: 9.5 mg/dL (ref 8.4–10.5)
CHLORIDE: 93 meq/L — AB (ref 96–112)
CO2: 25 mEq/L (ref 19–32)
Creat: 0.63 mg/dL (ref 0.50–1.10)
GFR, EST NON AFRICAN AMERICAN: 87 mL/min
GFR, Est African American: >89 mL/min
GLUCOSE: 99 mg/dL (ref 70–99)
Potassium: 4.5 mEq/L (ref 3.5–5.3)
Sodium: 128 mEq/L — ABNORMAL LOW (ref 135–145)
TOTAL PROTEIN: 6.8 g/dL (ref 6.0–8.3)

## 2014-03-20 LAB — CBC WITH DIFFERENTIAL/PLATELET
Basophils Absolute: 0 10*3/uL (ref 0.0–0.1)
Basophils Relative: 0 % (ref 0–1)
EOS ABS: 0.3 10*3/uL (ref 0.0–0.7)
EOS PCT: 3 % (ref 0–5)
HCT: 44.5 % (ref 36.0–46.0)
HEMOGLOBIN: 15.6 g/dL — AB (ref 12.0–15.0)
LYMPHS ABS: 1.3 10*3/uL (ref 0.7–4.0)
LYMPHS PCT: 14 % (ref 12–46)
MCH: 28.9 pg (ref 26.0–34.0)
MCHC: 35.1 g/dL (ref 30.0–36.0)
MCV: 82.6 fL (ref 78.0–100.0)
MONOS PCT: 11 % (ref 3–12)
Monocytes Absolute: 1 10*3/uL (ref 0.1–1.0)
NEUTROS PCT: 72 % (ref 43–77)
Neutro Abs: 6.8 10*3/uL (ref 1.7–7.7)
Platelets: 379 10*3/uL (ref 150–400)
RBC: 5.39 MIL/uL — AB (ref 3.87–5.11)
RDW: 13.5 % (ref 11.5–15.5)
WBC: 9.5 10*3/uL (ref 4.0–10.5)

## 2014-03-20 NOTE — Progress Notes (Signed)
   Subjective:    Patient ID: Laurie [name], female    DOB: 02/10/1935, 79 y.o.   MRN: [id number]  HPI Had  laparoscope a cholecystectomy done in January. She's having discomfort in the RUQ area radiating to the right and left side for about 2-3 weeks. She also feels like there's a knot in the epigastric area. She says she wasn't lifting anything heavy, etc.  Feels a little bit better when she's lying down gets worse when she tries as it up. She says that the surgery she did have an open wound that was draining and had to go UC to have treated.  Says does have some constipation but says she drinks prune juice, et ot help and this is chronic.  Has BM regulalry. No fever or chills or sweats.  No nausea or GERD. No blood in the stool.    She also woke up this morning with some changes in vision in her left eye. She actually feels like there's a band spanning in the corner of her eye on the left side. She's also had a little bit of tenderness and pressure over the right years initially when she lays on her pillow at night.  Review of Systems     Objective:   Physical Exam  Constitutional: She is oriented to person, place, and time. She appears well-developed and well-nourished.  HENT:  Head: Normocephalic and atraumatic.  Mouth/Throat: Oropharynx is clear and moist.  Her right tympanic membrane is opaque. She has a clear tympanostomy tube in place. No active drainage. No abnormality in the canal. Left ear canal and tympanic membrane are normal.  Eyes: Conjunctivae and EOM are normal. Pupils are equal, round, and reactive to light.  Neck: Neck supple. No thyromegaly present.  Cardiovascular: Normal rate, regular rhythm and normal heart sounds.   Pulmonary/Chest: Effort normal and breath sounds normal.  Abdominal: Soft. Bowel sounds are normal. She exhibits no distension and no mass. There is tenderness. There is no rebound and no guarding.  TTP in the LUQ and in the RLQ   Lymphadenopathy:     She has no cervical adenopathy.  Neurological: She is alert and oriented to person, place, and time.  Skin: Skin is warm and dry.  Psychiatric: She has a normal mood and affect. Her behavior is normal.          Assessment & Plan:  Right upper quadrant pain-will order CT of the abdomen to evaluate for further causes of her pain. She's 3 months out from surgery and her pain has been persistent from [place] 3 weeks at this point. She is definitely tender in the right upper quadrant. This could be related to her bowels or possibly liver. Also consider complications postop. Also check a CBC and a CMP today. She does have a history of abdominal aortic aneurysm and history of lung cancer.  Right otalgia-exam is completely normal. Reassured patient. The tympanostomy tube is open. She had been placed a most 40 years ago.  Visual changes in the left eye. We'll do eye chart exam. May need to refer to ophthalmology for further evaluation. A significant change in vision from the right eye compared to the left side. There is actually want her to get in with her ophthalmologist this week. She has any palms getting and please let me know.

## 2014-03-20 NOTE — Telephone Encounter (Signed)
PA obtained for CT ABD/Pelvis w/o. Auth # [id number]. Exp 04/18/14.  Oscar La, LPN

## 2014-03-21 ENCOUNTER — Encounter (HOSPITAL_BASED_OUTPATIENT_CLINIC_OR_DEPARTMENT_OTHER): Payer: Self-pay

## 2014-03-21 ENCOUNTER — Telehealth: Payer: Self-pay | Admitting: Family Medicine

## 2014-03-21 ENCOUNTER — Encounter: Payer: Self-pay | Admitting: Family Medicine

## 2014-03-21 ENCOUNTER — Ambulatory Visit (HOSPITAL_BASED_OUTPATIENT_CLINIC_OR_DEPARTMENT_OTHER)
Admission: RE | Admit: 2014-03-21 | Discharge: 2014-03-21 | Disposition: A | Discharge status: Home / Self Care | Payer: Medicare Other | Source: Ambulatory Visit | Attending: Family Medicine | Admitting: Family Medicine

## 2014-03-21 DIAGNOSIS — M5137 Other intervertebral disc degeneration, lumbosacral region: Secondary | ICD-10-CM | POA: Insufficient documentation

## 2014-03-21 DIAGNOSIS — K838 Other specified diseases of biliary tract: Secondary | ICD-10-CM | POA: Insufficient documentation

## 2014-03-21 DIAGNOSIS — R1011 Right upper quadrant pain: Secondary | ICD-10-CM

## 2014-03-21 DIAGNOSIS — R911 Solitary pulmonary nodule: Secondary | ICD-10-CM | POA: Insufficient documentation

## 2014-03-21 DIAGNOSIS — R109 Unspecified abdominal pain: Secondary | ICD-10-CM | POA: Insufficient documentation

## 2014-03-21 DIAGNOSIS — R1031 Right lower quadrant pain: Secondary | ICD-10-CM

## 2014-03-21 DIAGNOSIS — I700 Atherosclerosis of aorta: Secondary | ICD-10-CM | POA: Insufficient documentation

## 2014-03-21 DIAGNOSIS — I714 Abdominal aortic aneurysm, without rupture, unspecified: Secondary | ICD-10-CM | POA: Insufficient documentation

## 2014-03-21 DIAGNOSIS — J984 Other disorders of lung: Secondary | ICD-10-CM | POA: Insufficient documentation

## 2014-03-21 DIAGNOSIS — M51379 Other intervertebral disc degeneration, lumbosacral region without mention of lumbar back pain or lower extremity pain: Secondary | ICD-10-CM | POA: Insufficient documentation

## 2014-03-21 MED ORDER — IOHEXOL 300 MG/ML  SOLN
100.0000 mL | Freq: Once | INTRAMUSCULAR | Status: AC | PRN
  Administered 2014-03-21: 100 mL via INTRAVENOUS

## 2014-03-21 NOTE — Telephone Encounter (Signed)
Patient advised of suggestion to stop hydrochlorozide since sodium and chloride were low.  Advised to still drink the contrast for her CT scan today at noon.

## 2014-03-27 ENCOUNTER — Telehealth: Payer: Self-pay | Admitting: Cardiology

## 2014-03-27 ENCOUNTER — Other Ambulatory Visit: Payer: Self-pay | Admitting: *Deleted

## 2014-03-27 MED ORDER — LISINOPRIL 40 MG PO TABS: 40.0000 mg | ORAL_TABLET | Freq: Every day | ORAL | Status: DC

## 2014-03-27 NOTE — Telephone Encounter (Signed)
New message          Pt would like to know if she could get her echo done in [facility]?

## 2014-03-27 NOTE — Telephone Encounter (Signed)
Echo scheduled for 5/6 at 11 AM, prior auth completed per Charmaine.   Patient aware of date, time, place.

## 2014-03-27 NOTE — Telephone Encounter (Signed)
lmtcb

## 2014-04-03 ENCOUNTER — Other Ambulatory Visit: Payer: Self-pay | Admitting: Family Medicine

## 2014-04-04 ENCOUNTER — Ambulatory Visit (HOSPITAL_BASED_OUTPATIENT_CLINIC_OR_DEPARTMENT_OTHER)
Admission: RE | Admit: 2014-04-04 | Discharge: 2014-04-04 | Disposition: A | Discharge status: Home / Self Care | Payer: Medicare Other | Source: Ambulatory Visit | Attending: Cardiology | Admitting: Cardiology

## 2014-04-04 DIAGNOSIS — I10 Essential (primary) hypertension: Secondary | ICD-10-CM | POA: Insufficient documentation

## 2014-04-04 DIAGNOSIS — Z8673 Personal history of transient ischemic attack (TIA), and cerebral infarction without residual deficits: Secondary | ICD-10-CM | POA: Insufficient documentation

## 2014-04-04 DIAGNOSIS — F17200 Nicotine dependence, unspecified, uncomplicated: Secondary | ICD-10-CM | POA: Insufficient documentation

## 2014-04-04 DIAGNOSIS — I519 Heart disease, unspecified: Secondary | ICD-10-CM | POA: Insufficient documentation

## 2014-04-04 DIAGNOSIS — I359 Nonrheumatic aortic valve disorder, unspecified: Secondary | ICD-10-CM | POA: Insufficient documentation

## 2014-04-04 DIAGNOSIS — I517 Cardiomegaly: Secondary | ICD-10-CM

## 2014-04-04 DIAGNOSIS — E785 Hyperlipidemia, unspecified: Secondary | ICD-10-CM | POA: Insufficient documentation

## 2014-04-04 NOTE — Progress Notes (Signed)
Echocardiogram 2D Echocardiogram has been performed.  [facility] 04/04/2014, 10:49 AM

## 2014-04-18 ENCOUNTER — Ambulatory Visit (INDEPENDENT_AMBULATORY_CARE_PROVIDER_SITE_OTHER): Payer: Medicare Other | Admitting: Cardiology

## 2014-04-18 ENCOUNTER — Encounter: Payer: Self-pay | Admitting: Cardiology

## 2014-04-18 VITALS — BP 156/86 | HR 81 | Wt 172.0 lb

## 2014-04-18 DIAGNOSIS — E785 Hyperlipidemia, unspecified: Secondary | ICD-10-CM

## 2014-04-18 DIAGNOSIS — I10 Essential (primary) hypertension: Secondary | ICD-10-CM

## 2014-04-18 DIAGNOSIS — Z952 Presence of prosthetic heart valve: Secondary | ICD-10-CM

## 2014-04-18 DIAGNOSIS — I6529 Occlusion and stenosis of unspecified carotid artery: Secondary | ICD-10-CM

## 2014-04-18 DIAGNOSIS — I714 Abdominal aortic aneurysm, without rupture, unspecified: Secondary | ICD-10-CM

## 2014-04-18 DIAGNOSIS — I2510 Atherosclerotic heart disease of native coronary artery without angina pectoris: Secondary | ICD-10-CM

## 2014-04-18 DIAGNOSIS — Z954 Presence of other heart-valve replacement: Secondary | ICD-10-CM

## 2014-04-18 DIAGNOSIS — I723 Aneurysm of iliac artery: Secondary | ICD-10-CM

## 2014-04-18 NOTE — Assessment & Plan Note (Signed)
Continue SBE prophylaxis. 

## 2014-04-18 NOTE — Assessment & Plan Note (Signed)
Blood pressure mildly elevated but she follows this at home and it is typically controlled. We will increase metoprolol in the future if needed. Recent discontinuation of HCTZ because of hyponatremia.

## 2014-04-18 NOTE — Progress Notes (Signed)
HPI: FU; ho aortic stenosis status post aortic valve replacement in September 2009. She had a pericardial tissue valve. She also had a septal myectomy and a right middle lobectomy due to lung CA. Note, she has a 50% mid LAD by catheterization preoperatively. Followup carotid Dopplers in Nov 2014 revealed a 40-59% left and 1-39% right stenosis. FU recommended in one year. Abdominal CT in April of 2015 showed a 3.2 cm infrarenal abdominal aortic aneurysm. Echocardiogram in May of 2015 showed normal LV function, bioprosthetic aortic valve with no aortic insufficiency and mild left atrial enlargement. I last saw her in Nov of 2014. Since then, she denies dyspnea, chest pain or syncope.   Current Outpatient Prescriptions  Medication Sig Dispense Refill  . acetaminophen (TYLENOL) 500 MG tablet Take 500 mg by mouth every 6 (six) hours as needed. Pain        . ALPRAZolam (XANAX) 0.5 MG tablet TAKE 1 TABLET BY MOUTH TWICE A DAY  60 tablet  0  . aspirin 81 MG tablet Take 81 mg by mouth daily.        Marland Kitchen lisinopril (PRINIVIL,ZESTRIL) 40 MG tablet Take 1 tablet (40 mg total) by mouth daily.  90 tablet  0  . metoprolol (LOPRESSOR) 50 MG tablet TAKE 1 TABLET BY MOUTH TWO TIMES A DAY  180 tablet  0   No current facility-administered medications for this visit.     Past Medical History  Diagnosis Date  . Anemia     iron deficiency  . Shingles 11-09  . COPD (chronic obstructive pulmonary disease) 2005    on CXR with R apical scaring  . Atrial fibrillation     post op  . Hypertension   . Cerebrovascular disease   . S/P aortic valve replacement 08/28/2008    [facility]  . S/P lobectomy of lung 08/28/2008    rt middle lobe  dr. Roxy Manns  . AAA (abdominal aortic aneurysm)   . CAD (coronary artery disease)   . Hypercholesterolemia     Hx: of  . Headache(784.0)     Hx: of migraines in the past  . Carcinoma of lung 08/28/2008    T2N0 moderately diff. squamous cell CA    Past Surgical History    Procedure Laterality Date  . Aapy    . Tonsillectomy and adenoidectomy    . U/s guided aspiration of r breast cyst   2009    [facility] the [facility] clinic  . Rml removed dr Ricard Dillon for lung cancer  08/28/2008    T2N0 squamous cell CA  . Cardiac valve replacement    . Abdominal surgery    . Tonsillectomy    . Aortic valve replacement  08/28/2008    #75mm [facility] Ease pericardial tissue valve  . Cardiac valve surgery  08/28/2008    septal myomectomy  . Colonoscopy      Hx: of  . Cataract extraction w/ intraocular lens  implant, bilateral      Hx:of  . Dilation and curettage of uterus    . Cardiac catheterization    . Appendectomy    . Cholecystectomy N/A 12/13/2013    Procedure: LAPAROSCOPIC CHOLECYSTECTOMY;  Surgeon: Ralene Ok, MD;  Location: [place];  Service: General;  Laterality: N/A;    History   Social History  . Marital Status: Single    Spouse Name: N/A    Number of Children: N/A  . Years of Education: N/A   Occupational History  . Not on  file.   Social History Main Topics  . Smoking status: Former Smoker -- 40 years  . Smokeless tobacco: Never Used  . Alcohol Use: No  . Drug Use: No  . Sexual Activity: Not on file   Other Topics Concern  . Not on file   Social History Narrative  . No narrative on file    ROS: no fevers or chills, productive cough, hemoptysis, dysphasia, odynophagia, melena, hematochezia, dysuria, hematuria, rash, seizure activity, orthopnea, PND, pedal edema, claudication. Remaining systems are negative.  Physical Exam: Well-developed well-nourished in no acute distress.  Skin is warm and dry.  HEENT is normal.  Neck is supple.  Chest is clear to auscultation with normal expansion.  Cardiovascular exam is regular rate and rhythm. 2/6 systolic murmur left sternal border. No diastolic murmur. Abdominal exam nontender or distended. No masses palpated. Extremities show no edema. neuro grossly intact  ECG Sinus rhythm at a rate of 81.  Normal axis. Cannot rule out prior septal infarct. Lateral T-wave inversion.

## 2014-04-18 NOTE — Assessment & Plan Note (Signed)
Continue aspirin. Intolerant to statins. 

## 2014-04-18 NOTE — Assessment & Plan Note (Signed)
Repeat CTA April 2016.

## 2014-04-18 NOTE — Assessment & Plan Note (Signed)
Continue diet. Intolerant to statins. 

## 2014-04-18 NOTE — Assessment & Plan Note (Signed)
Continue aspirin. Repeat carotid Dopplers November 2015.

## 2014-04-18 NOTE — Patient Instructions (Signed)
Your physician wants you to follow-up in: [place] will receive a reminder letter in the mail two months in advance. If you don't receive a letter, please call our office to schedule the follow-up appointment.   Your physician has requested that you have a carotid duplex. This test is an ultrasound of the carotid arteries in your neck. It looks at blood flow through these arteries that supply the brain with blood. Allow one hour for this exam. There are no restrictions or special instructions.DUE IN November  CTA OF [place] IN April 2016

## 2014-04-27 ENCOUNTER — Telehealth: Payer: Self-pay | Admitting: Cardiology

## 2014-04-27 MED ORDER — METOPROLOL TARTRATE 50 MG PO TABS: 75.0000 mg | ORAL_TABLET | Freq: Two times a day (BID) | ORAL | Status: DC

## 2014-04-27 NOTE — Telephone Encounter (Signed)
Patient is still having trouble with BP. She has questions about medication please call and advise.

## 2014-04-27 NOTE — Telephone Encounter (Signed)
Spoke with pt, Aware of dr crenshaw's recommendations.  °

## 2014-04-27 NOTE — Telephone Encounter (Signed)
Change metoprolol to 75 mg po BID Lelon Perla

## 2014-04-27 NOTE — Telephone Encounter (Signed)
Spoke with pt, her blood pressure has been running 149-150/85 pulse around 62 Will forward for dr Stanford Breed review

## 2014-05-02 ENCOUNTER — Other Ambulatory Visit: Payer: Self-pay | Admitting: Family Medicine

## 2014-05-03 ENCOUNTER — Other Ambulatory Visit: Payer: Self-pay | Admitting: Family Medicine

## 2014-05-30 ENCOUNTER — Other Ambulatory Visit: Payer: Self-pay | Admitting: Family Medicine

## 2014-06-04 ENCOUNTER — Other Ambulatory Visit: Payer: Self-pay | Admitting: Family Medicine

## 2014-06-04 NOTE — Telephone Encounter (Signed)
Please call patient and let her know that I got a note from Dr. Jodi Mourning.  Based on her findings in your medical history she had recommended blood work and an MRI of the brain. If she is okay with that I would like to order a CBC, sedimentation rate, CRP, and MRA/MRI of the brain. Let me know she would like to do. She evidently complained of pain radiating from the back of her left eye down to her neck as well as a visual disturbance of the left eye that look like a rotating fan.

## 2014-06-08 ENCOUNTER — Telehealth: Payer: Self-pay | Admitting: *Deleted

## 2014-06-08 NOTE — Telephone Encounter (Signed)
Per Dr Jacalyn Lefevre last OV with the pt, her blood pressure mildly elevated but she follows this at home and it is typically controlled. We will increase metoprolol in the future if needed. Recent discontinuation of HCTZ because of hyponatremia.

## 2014-06-08 NOTE — Telephone Encounter (Signed)
[facility], but it looks like it was discontinued and I don't see that it was re-started. Please advise if ok to refill. Thanks, MI

## 2014-06-22 ENCOUNTER — Other Ambulatory Visit: Payer: Self-pay | Admitting: Family Medicine

## 2014-06-25 ENCOUNTER — Telehealth: Payer: Self-pay | Admitting: Cardiology

## 2014-06-25 MED ORDER — LISINOPRIL 40 MG PO TABS: 40.0000 mg | ORAL_TABLET | Freq: Every day | ORAL | Status: DC

## 2014-06-25 NOTE — Telephone Encounter (Signed)
Please call, concerning her Lisinopril. Pt says she only received #30 and it should be #90. She says she does not need an appt.

## 2014-06-25 NOTE — Telephone Encounter (Signed)
Spoke with pt, new script sent to the pharm

## 2014-07-05 ENCOUNTER — Other Ambulatory Visit: Payer: Self-pay | Admitting: Family Medicine

## 2014-07-23 ENCOUNTER — Other Ambulatory Visit: Payer: Self-pay | Admitting: Family Medicine

## 2014-07-27 ENCOUNTER — Telehealth: Payer: Self-pay | Admitting: *Deleted

## 2014-07-27 MED ORDER — AMOXICILLIN 500 MG PO CAPS: 2000.0000 mg | ORAL_CAPSULE | Freq: Once | ORAL | Status: DC

## 2014-07-27 NOTE — Telephone Encounter (Signed)
Pt called asking for an Abx to be sent into her pharmacy because she is having dental work done next week.Laurie [name]

## 2014-08-08 ENCOUNTER — Other Ambulatory Visit: Payer: Self-pay | Admitting: Sports Medicine

## 2014-08-13 ENCOUNTER — Encounter: Payer: Self-pay | Admitting: Family Medicine

## 2014-08-13 ENCOUNTER — Ambulatory Visit (INDEPENDENT_AMBULATORY_CARE_PROVIDER_SITE_OTHER): Payer: Medicare Other | Admitting: Family Medicine

## 2014-08-13 ENCOUNTER — Ambulatory Visit (INDEPENDENT_AMBULATORY_CARE_PROVIDER_SITE_OTHER): Payer: Medicare Other

## 2014-08-13 VITALS — BP 166/91 | HR 72 | Temp 98.4°F | Wt 173.0 lb

## 2014-08-13 DIAGNOSIS — M546 Pain in thoracic spine: Secondary | ICD-10-CM

## 2014-08-13 DIAGNOSIS — D239 Other benign neoplasm of skin, unspecified: Secondary | ICD-10-CM

## 2014-08-13 DIAGNOSIS — W010XXA Fall on same level from slipping, tripping and stumbling without subsequent striking against object, initial encounter: Secondary | ICD-10-CM

## 2014-08-13 DIAGNOSIS — R1084 Generalized abdominal pain: Secondary | ICD-10-CM

## 2014-08-13 DIAGNOSIS — M899 Disorder of bone, unspecified: Secondary | ICD-10-CM

## 2014-08-13 DIAGNOSIS — D229 Melanocytic nevi, unspecified: Secondary | ICD-10-CM

## 2014-08-13 DIAGNOSIS — M949 Disorder of cartilage, unspecified: Secondary | ICD-10-CM

## 2014-08-13 MED ORDER — TRAMADOL HCL 50 MG PO TABS: 50.0000 mg | ORAL_TABLET | Freq: Three times a day (TID) | ORAL | Status: DC | PRN

## 2014-08-13 NOTE — Progress Notes (Signed)
Subjective:    Patient ID: Laurie [name], female    DOB: 11-Nov-1935, 79 y.o.   MRN: [id number]  HPI Her son was mopping and the floor was still wet about 3 weeks ago.  She slipped and fell and now has had pain over her upper back and left shoulder.  The pain and soreness comes and goes. It seems to be worse in the morning after she first gets up. She has taken tramadol in the past and wants to know she can have a refill on this medication for her pain.  She also c/o of pain over her stomach  Has been sore over her incisions where she had her GB removed in Jan.  She says she occ uses stool softern.  No feve, chills or sweats. Says her belly was so sore she didn't want to eat.  No blood in the stool.  + bloating.  Belly feels more full when she does eat.  Started about 1-2 months ago.    She has moles on her neck that have recently become itchey. They have been there for some time but recently they have been intensely itchy there is a little bit better this week.   Review of Systems     Objective:   Physical Exam  Constitutional: She is oriented to person, place, and time. She appears well-developed and well-nourished.  HENT:  Head: Normocephalic and atraumatic.  Right Ear: External ear normal.  Left Ear: External ear normal.  Cardiovascular: Normal rate, regular rhythm and normal heart sounds.   Pulmonary/Chest: Effort normal and breath sounds normal.  Abdominal: Soft. Bowel sounds are normal. She exhibits no distension and no mass. There is tenderness. There is no rebound and no guarding.  Mildly tender in the LLQ  Musculoskeletal: She exhibits no edema.  Neck with normal flexion, since and, rotation right and left and side bending. Shoulders with normal range of motion. She is tender around the paraspinous muscles over the upper to midthoracic spine just above the bra line.  Neurological: She is alert and oriented to person, place, and time.  Upper extremity reflexes are 1+  bilaterally.  Skin: Skin is warm and dry.  She does have been an atypical nevus on the posterior portion of the neck. The color is somewhat different in her surrounding moles. Appears to be darker with some dry skin on the surface. It is round and regular. Beside this she has a small skin tag. These are the  2 lesions of concern.  Psychiatric: She has a normal mood and affect. Her behavior is normal.          Assessment & Plan:  Atypical nevus on the back of neck - recommend bx since looks unusual compared to other moles and recent itching.  She also has a skin tag that itchy and bothersome around the neck as well and certainly that could be removed easily as well.  Abdominal pain-I look back and we had actually done a CT on her abdomen back in April for very similar symptoms. The only thing that was noted was a significant stool in the colon. Since she does not have any red flag symptoms such as fevers or chills or blood in the stool I would like to have her restart the MiraLax and get her bowels going over the next 5 days. If she still having significant pain and not getting any relief then recommend repeat exam of the abdomen with CT.  Upper thoracic back pain  status post fall-because she did fall and based on her age I would like to get an x-ray to rule out compression fracture. The meantime can certainly use a heating pad and anti-inflammatory as tolerated.

## 2014-08-13 NOTE — Patient Instructions (Signed)
Recommend start Miralax once or twice a day for next week until soft bowel movement without straining.

## 2014-08-27 ENCOUNTER — Ambulatory Visit (INDEPENDENT_AMBULATORY_CARE_PROVIDER_SITE_OTHER): Payer: Medicare Other | Admitting: Thoracic Surgery (Cardiothoracic Vascular Surgery)

## 2014-08-27 ENCOUNTER — Ambulatory Visit
Admission: RE | Admit: 2014-08-27 | Discharge: 2014-08-27 | Disposition: A | Discharge status: Home / Self Care | Payer: Medicare Other | Source: Ambulatory Visit | Attending: Thoracic Surgery (Cardiothoracic Vascular Surgery) | Admitting: Thoracic Surgery (Cardiothoracic Vascular Surgery)

## 2014-08-27 ENCOUNTER — Encounter: Payer: Self-pay | Admitting: Thoracic Surgery (Cardiothoracic Vascular Surgery)

## 2014-08-27 ENCOUNTER — Other Ambulatory Visit: Payer: Self-pay | Admitting: *Deleted

## 2014-08-27 VITALS — BP 156/82 | HR 67 | Resp 18 | Ht 67.0 in | Wt 165.0 lb

## 2014-08-27 DIAGNOSIS — C342 Malignant neoplasm of middle lobe, bronchus or lung: Secondary | ICD-10-CM

## 2014-08-27 DIAGNOSIS — I77810 Thoracic aortic ectasia: Secondary | ICD-10-CM | POA: Insufficient documentation

## 2014-08-27 DIAGNOSIS — C3490 Malignant neoplasm of unspecified part of unspecified bronchus or lung: Secondary | ICD-10-CM

## 2014-08-27 DIAGNOSIS — Z952 Presence of prosthetic heart valve: Secondary | ICD-10-CM

## 2014-08-27 DIAGNOSIS — Z9889 Other specified postprocedural states: Secondary | ICD-10-CM

## 2014-08-27 DIAGNOSIS — Z902 Acquired absence of lung [part of]: Secondary | ICD-10-CM

## 2014-08-27 DIAGNOSIS — Z954 Presence of other heart-valve replacement: Secondary | ICD-10-CM

## 2014-08-27 NOTE — Progress Notes (Signed)
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 At the same time she underwent aortic valve replacement using a bioprosthetic tissue valve and septal myomectomy. She was last seen here in our office on 08/28/2013.  Since then she underwent laparoscopic cholecystectomy in January of this year. She experienced problems with postoperative bleeding and persistent abdominal pain since that time. CT scan of the abdomen and pelvis revealed a small infrarenal abdominal aortic aneurysm.  She continues to followup with Dr. Stanford Breed on a regular basis. Her most recent echocardiogram on 04/04/2014 revealed normal LV systolic function with ejection fraction estimated 60-65%. A bioprosthetic tissue valve in the aortic position was functioning normally.  The patient returns for office for routine followup today. She denies any problems of persistent cough, hemoptysis, chest pain, or other constitutional symptoms to suggest the presence of either locally recurrent nor distant metastatic lung cancer. She continues to do complain of persistent symptoms of mild abdominal discomfort or a "fullness" on the right side of her upper abdomen that she states began when she underwent laparoscopic cholecystectomy.  The remainder of her past medical history is unchanged. The remainder of her review of systems is noncontributory.    Current Outpatient Prescriptions  Medication Sig Dispense Refill  . acetaminophen (TYLENOL) 500 MG tablet Take 500 mg by mouth every 6 (six) hours as needed. Pain        . ALPRAZolam (XANAX) 0.5 MG tablet TAKE 1 TAB HS      . amoxicillin  (AMOXIL) 500 MG capsule Take 4 capsules (2,000 mg total) by mouth once. Take 30 to 60 mins prior to procedure  30 capsule  0  . lisinopril (PRINIVIL,ZESTRIL) 40 MG tablet Take 1 tablet (40 mg total) by mouth daily.  90 tablet  3  . metoprolol (LOPRESSOR) 50 MG tablet Take 1.5 tablets (75 mg total) by mouth 2 (two) times daily.  270 tablet  3  . aspirin 81 MG tablet Take 81 mg by mouth daily.         No current facility-administered medications for this visit.      Physical Exam:   BP 156/82  Pulse 67  Resp 18  Ht 5\' 7"  (1.702 m)  Wt 165 lb (74.844 kg)  BMI 25.84 kg/m2  SpO2 96%  General:  Well-appearing  Chest:   clear  CV:   Regular rate and rhythm without murmur  Incisions:  n/a  Abdomen:  Soft nontender  Extremities:  Warm and well-perfused  Diagnostic Tests:  CHEST 2 VIEW  COMPARISON: 08/13/2014 and CT chest 08/28/2013.  FINDINGS:  Trachea is midline. Heart size normal. There are postoperative  changes, scarring and volume loss in the right hemi thorax. Added  density in the apex of the right hemi thorax may correspond to  pleural thickening on 08/28/2013. Lungs are otherwise clear. No  pleural fluid.  IMPRESSION:  1. Added density in the apex of the right hemi thorax may correspond  to pleural thickening seen on 08/28/2013. Consider repeat CT chest  without contrast in further evaluation, as clinically indicated,  in  further evaluation.  2. Postoperative changes, scarring and volume loss in the right hemi  thorax.  Electronically Signed  By: Lorin Picket M.D.  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  Plan:  The patient will return in one year for routine followup and surveillance. Next year we will obtain repeat CT scan of the chest to evaluate the benign-appearing pleural thickening seen in the  right chest as well as mild aneurysmal enlargement of the ascending thoracic aorta. At that time we will also obtain CT angiogram of the abdomen and pelvis to followup the presence of the mild aneurysmal enlargement of the infrarenal abdominal aorta.  I spent in excess of 15 minutes during the conduct of this office consultation and >50% of this time involved direct face-to-face encounter with the patient for counseling and/or coordination of their care.   Valentina Gu. Roxy Manns, MD 08/27/2014 3:28 PM

## 2014-09-07 ENCOUNTER — Ambulatory Visit (INDEPENDENT_AMBULATORY_CARE_PROVIDER_SITE_OTHER): Payer: Medicare Other | Admitting: Sports Medicine

## 2014-09-07 ENCOUNTER — Encounter: Payer: Self-pay | Admitting: Sports Medicine

## 2014-09-07 VITALS — BP 119/75 | HR 71 | Ht 67.0 in | Wt 170.0 lb

## 2014-09-07 DIAGNOSIS — J0100 Acute maxillary sinusitis, unspecified: Secondary | ICD-10-CM

## 2014-09-07 MED ORDER — AZITHROMYCIN 250 MG PO TABS: ORAL_TABLET | ORAL | Status: DC

## 2014-09-07 MED ORDER — PREDNISONE 50 MG PO TABS: 50.0000 mg | ORAL_TABLET | Freq: Every day | ORAL | Status: DC

## 2014-09-07 NOTE — Assessment & Plan Note (Signed)
Prednisone, azithromycin. She is having some right-sided hearing abnormalities, tinnitus, these may represent acoustic neuronitis. If the symptoms do not resolve after the antibiotic and steroid, considering history of lung cancer we certainly would need to consider brain imaging to rule out any possibility of metastatic disease. Return in 2 weeks.

## 2014-09-07 NOTE — Progress Notes (Signed)
  Subjective:    CC: Infection and ear pain  HPI: This is a very pleasant 79 year old female, for the past several weeks she's had pain in her maxillary and frontal sinuses with radiation to the right ear, she is also having some significant tinnitus in the right ear. Symptoms are moderate, persistent, she does have a history of lung cancer.  Past medical history, Surgical history, Family history not pertinant except as noted below, Social history, Allergies, and medications have been entered into the medical record, reviewed, and no changes needed.   Review of Systems: No fevers, chills, night sweats, weight loss, chest pain, or shortness of breath.   Objective:    General: Well Developed, well nourished, and in no acute distress.  Neuro: Alert and oriented x3, extra-ocular muscles intact, sensation grossly intact.  HEENT: Normocephalic, atraumatic, pupils equal round reactive to light, neck supple, no masses, no lymphadenopathy, thyroid nonpalpable. Tender to palpation over the maxillary sinus. External ear canals unremarkable, oropharynx, nasopharynx unremarkable. No pain over the mastoid sinuses. Skin: Warm and dry, no rashes. Cardiac: Regular rate and rhythm, no murmurs rubs or gallops, no lower extremity edema.  Respiratory: Clear to auscultation bilaterally. Not using accessory muscles, speaking in full sentences.  Impression and Recommendations:

## 2014-09-14 ENCOUNTER — Other Ambulatory Visit: Payer: Self-pay | Admitting: *Deleted

## 2014-09-14 MED ORDER — ALPRAZOLAM 0.5 MG PO TABS: ORAL_TABLET | ORAL | Status: DC

## 2014-09-20 ENCOUNTER — Encounter: Payer: Self-pay | Admitting: Family Medicine

## 2014-09-20 ENCOUNTER — Ambulatory Visit (INDEPENDENT_AMBULATORY_CARE_PROVIDER_SITE_OTHER): Payer: Medicare Other | Admitting: Family Medicine

## 2014-09-20 VITALS — BP 144/84 | HR 64 | Temp 97.7°F | Wt 170.0 lb

## 2014-09-20 DIAGNOSIS — Z85118 Personal history of other malignant neoplasm of bronchus and lung: Secondary | ICD-10-CM

## 2014-09-20 DIAGNOSIS — R059 Cough, unspecified: Secondary | ICD-10-CM

## 2014-09-20 DIAGNOSIS — J209 Acute bronchitis, unspecified: Secondary | ICD-10-CM

## 2014-09-20 DIAGNOSIS — R05 Cough: Secondary | ICD-10-CM

## 2014-09-20 DIAGNOSIS — H9201 Otalgia, right ear: Secondary | ICD-10-CM

## 2014-09-20 MED ORDER — LEVOFLOXACIN 500 MG PO TABS: 500.0000 mg | ORAL_TABLET | Freq: Every day | ORAL | Status: AC

## 2014-09-20 MED ORDER — ALPRAZOLAM 0.5 MG PO TABS: ORAL_TABLET | ORAL | Status: DC

## 2014-09-20 MED ORDER — ALBUTEROL SULFATE (2.5 MG/3ML) 0.083% IN NEBU
2.5000 mg | INHALATION_SOLUTION | Freq: Once | RESPIRATORY_TRACT | Status: AC
  Administered 2014-09-20: 2.5 mg via RESPIRATORY_TRACT

## 2014-09-20 MED ORDER — ALBUTEROL SULFATE 108 (90 BASE) MCG/ACT IN AEPB: 2.0000 | INHALATION_SPRAY | RESPIRATORY_TRACT | Status: DC | PRN

## 2014-09-20 NOTE — Progress Notes (Signed)
   Subjective:    Patient ID: Laurie [name], female    DOB: [birth year], 79 y.o.   MRN: [id number]  URI    she was seen by Dr. Darene Lamer on 09/07/14 for sinusitis with zpack and prednisone.  She reports that she is coughing up thick white mucus she said that this has been going on for 3 weeks now. Says she is sore from coughing.  No fever, chills or sweats. 23 you female w/ prior hx of Lung Cancer. Just saw CVTS and had CXR in September. Says her sinus sxs are better.  Says sometimes has noticed a wheeze. Says actually feels better at night when laying down.    Review of Systems     Objective:   Physical Exam  Constitutional: She is oriented to person, place, and time. She appears well-developed and well-nourished.  HENT:  Head: Normocephalic and atraumatic.  Right Ear: External ear normal.  Left Ear: External ear normal.  Nose: Nose normal.  Mouth/Throat: Oropharynx is clear and moist.  TMs and canals are clear. Tube in the right ear canal is blocked.   Eyes: Conjunctivae and EOM are normal. Pupils are equal, round, and reactive to light.  Neck: Neck supple. No thyromegaly present.  Cardiovascular: Normal rate, regular rhythm and normal heart sounds.   Pulmonary/Chest: Effort normal. She has no wheezes.  Diffuse rhonchi and slight wheeze.    Lymphadenopathy:    She has no cervical adenopathy.  Neurological: She is alert and oriented to person, place, and time.  Skin: Skin is warm and dry.  Psychiatric: She has a normal mood and affect.          Assessment & Plan:  Acute bronchitis  Vs pneumonia . Cough for 3 week, productive of large amounts of sputum.Some SOB and wheeze.  She is  Will tx with FQ and albuterol prn.  Call if not sig better by Monday. If not better will do CXR.  Pt preferred not to do CXR today. Will have her f/u in 1 month for spirometry.  Really need to get a baseline lung function when she is well.

## 2014-10-05 ENCOUNTER — Other Ambulatory Visit (HOSPITAL_COMMUNITY): Payer: Self-pay | Admitting: *Deleted

## 2014-10-05 DIAGNOSIS — I6523 Occlusion and stenosis of bilateral carotid arteries: Secondary | ICD-10-CM

## 2014-10-08 ENCOUNTER — Telehealth: Payer: Self-pay | Admitting: *Deleted

## 2014-10-08 NOTE — Telephone Encounter (Signed)
Called [facility] asking that they release pt's xanax now instead of 10/11/14 due to oversight on refill of this med spoke w/ala. Pt informed.  Audelia Hives [place]

## 2014-10-12 ENCOUNTER — Other Ambulatory Visit: Payer: Self-pay | Admitting: Family Medicine

## 2014-10-16 ENCOUNTER — Encounter (HOSPITAL_COMMUNITY): Payer: Medicare Other

## 2014-11-02 ENCOUNTER — Ambulatory Visit (HOSPITAL_COMMUNITY): Payer: Medicare Other | Attending: Cardiology | Admitting: *Deleted

## 2014-11-02 DIAGNOSIS — I6523 Occlusion and stenosis of bilateral carotid arteries: Secondary | ICD-10-CM | POA: Diagnosis present

## 2014-11-02 NOTE — Progress Notes (Signed)
Carotid Duplex Performed

## 2014-11-14 ENCOUNTER — Other Ambulatory Visit: Payer: Self-pay | Admitting: Family Medicine

## 2014-11-20 ENCOUNTER — Other Ambulatory Visit: Payer: Self-pay | Admitting: Family Medicine

## 2014-12-19 ENCOUNTER — Other Ambulatory Visit: Payer: Self-pay | Admitting: Family Medicine

## 2015-01-09 ENCOUNTER — Other Ambulatory Visit: Payer: Self-pay | Admitting: Family Medicine

## 2015-01-11 ENCOUNTER — Other Ambulatory Visit: Payer: Self-pay | Admitting: Family Medicine

## 2015-01-11 NOTE — Telephone Encounter (Signed)
Error

## 2015-01-23 ENCOUNTER — Other Ambulatory Visit: Payer: Self-pay | Admitting: Family Medicine

## 2015-01-28 ENCOUNTER — Other Ambulatory Visit: Payer: Self-pay | Admitting: *Deleted

## 2015-01-28 MED ORDER — ALPRAZOLAM 0.5 MG PO TABS: 0.5000 mg | ORAL_TABLET | Freq: Two times a day (BID) | ORAL | Status: DC

## 2015-01-30 ENCOUNTER — Ambulatory Visit (INDEPENDENT_AMBULATORY_CARE_PROVIDER_SITE_OTHER): Payer: Medicare Other

## 2015-01-30 ENCOUNTER — Ambulatory Visit (INDEPENDENT_AMBULATORY_CARE_PROVIDER_SITE_OTHER): Payer: Medicare Other | Admitting: Family Medicine

## 2015-01-30 ENCOUNTER — Encounter: Payer: Self-pay | Admitting: Family Medicine

## 2015-01-30 VITALS — BP 142/77 | HR 69 | Ht 67.0 in | Wt 169.0 lb

## 2015-01-30 DIAGNOSIS — M7071 Other bursitis of hip, right hip: Secondary | ICD-10-CM

## 2015-01-30 DIAGNOSIS — M25511 Pain in right shoulder: Secondary | ICD-10-CM

## 2015-01-30 DIAGNOSIS — R1084 Generalized abdominal pain: Secondary | ICD-10-CM

## 2015-01-30 LAB — CBC WITH DIFFERENTIAL/PLATELET
BASOS PCT: 0 % (ref 0–1)
Basophils Absolute: 0 10*3/uL (ref 0.0–0.1)
EOS ABS: 0.1 10*3/uL (ref 0.0–0.7)
Eosinophils Relative: 1 % (ref 0–5)
HEMATOCRIT: 46.1 % — AB (ref 36.0–46.0)
HEMOGLOBIN: 15.7 g/dL — AB (ref 12.0–15.0)
Lymphocytes Relative: 16 % (ref 12–46)
Lymphs Abs: 1.4 10*3/uL (ref 0.7–4.0)
MCH: 29.5 pg (ref 26.0–34.0)
MCHC: 34.1 g/dL (ref 30.0–36.0)
MCV: 86.5 fL (ref 78.0–100.0)
MONO ABS: 0.7 10*3/uL (ref 0.1–1.0)
MONOS PCT: 8 % (ref 3–12)
MPV: 10.3 fL (ref 8.6–12.4)
NEUTROS ABS: 6.8 10*3/uL (ref 1.7–7.7)
Neutrophils Relative %: 75 % (ref 43–77)
PLATELETS: 341 10*3/uL (ref 150–400)
RBC: 5.33 MIL/uL — ABNORMAL HIGH (ref 3.87–5.11)
RDW: 13.4 % (ref 11.5–15.5)
WBC: 9 10*3/uL (ref 4.0–10.5)

## 2015-01-30 NOTE — Progress Notes (Signed)
   Subjective:    Patient ID: Laurie [name], female    DOB: 1935-06-20, 80 y.o.   MRN: [id number]  HPI Right buttock pain started about 2 months ago. No trauma or injury.  Says painful to sit for longer periods of time.   Says has pain in her right upper pain over her shoulder as well. Says it is hard to raise her right arm.  Worse when tries to do dishes or laundry.  Did fall over a year ago.  Says when lays on her side at night it is painful.  Says the tramadol and tylenol helps some but not completely.    Getting stomach pain in the AM as well. Says mostly over her old incisoin in her abdomen. She has been moving her bowels well. No constipation.   No nausea.  Discomfort is moe in the AM. No change with eating.  No fevers chills or sweats. She says she had purposely takes a fair amount of fiber to help keep her bowels moving the occasionally will skip a day.   Review of Systems     Objective:   Physical Exam  Constitutional: She appears well-developed and well-nourished.  HENT:  Head: Normocephalic and atraumatic.  Abdominal: Soft. Bowel sounds are normal. There is tenderness.  TTP in the RUQ, LUQ, and epigastrum.  No HSM.   Musculoskeletal:  She is able to abduct her shoulder to about 110. When she gets above about 90 she has significant pain and she was not able to fully extend. She complained of pain mostly over the scapula and on the top of the shoulder. She was tender along the superior border of the scapula and near the acromioclavicular joint. Nontender over the upper thoracic spine but she was tender over the mid thoracic spine or the bra strap. Nontender over the clavicle. Strength was 5 out of 5 at the shoulder, elbow and wrist. Hip, knee, ankle strength is 5 out of 5 bilaterally. Patellar and upper chin the reflexes are 1+ and symmetric. She had significant tenderness over the issue bursa on the right. Nontender over the trochanteric bursa.          Assessment &  Plan:  Right shoulder pain-she has pain posteriorly on the top of the shoulder. But she also gets some discomfort under both axillas as well as the mid spine near the bra strap. She says she actually does feel a little bit better by the time she goes to bed at night. But a premature persists all day. Consider polymyalgia rheumatica. We'll check a sedimentation rate and a CRP as well as a CK. I'll consider consider maybe arthritis or bursitis. She is having difficulty getting her shoulder bothers about 100 without significant pain or discomfort and is unable to get it extended to a full 180. We'll do x-rays today since she did have a fall about a year ago that she thinks may have started her symptoms. Next  Right ischial bursitis-she is tender over the bursa. Recommend setting on patient in avoiding sitting for prolonged periods.  Upper abdominal pain mostly in the right upper quadrant, epigastrium, left upper quadrant. We'll check liver enzymes as well as amylase and lipase for pancreatic function. Consider could be gastritis related. She is mildly tender on exam today. No fevers chills or sweats. Will do a KUB today and call with results as well as check a CBC with differential.

## 2015-01-31 ENCOUNTER — Other Ambulatory Visit: Payer: Self-pay | Admitting: *Deleted

## 2015-01-31 DIAGNOSIS — M7071 Other bursitis of hip, right hip: Secondary | ICD-10-CM

## 2015-01-31 DIAGNOSIS — M898X9 Other specified disorders of bone, unspecified site: Secondary | ICD-10-CM

## 2015-01-31 DIAGNOSIS — M25511 Pain in right shoulder: Secondary | ICD-10-CM

## 2015-01-31 DIAGNOSIS — M8580 Other specified disorders of bone density and structure, unspecified site: Secondary | ICD-10-CM

## 2015-01-31 LAB — C-REACTIVE PROTEIN

## 2015-01-31 LAB — COMPLETE METABOLIC PANEL WITH GFR
ALK PHOS: 68 U/L (ref 39–117)
ALT: 15 U/L (ref 0–35)
AST: 19 U/L (ref 0–37)
Albumin: 4 g/dL (ref 3.5–5.2)
BILIRUBIN TOTAL: 0.8 mg/dL (ref 0.2–1.2)
BUN: 7 mg/dL (ref 6–23)
CO2: 25 mEq/L (ref 19–32)
Calcium: 9.2 mg/dL (ref 8.4–10.5)
Chloride: 96 mEq/L (ref 96–112)
Creat: 0.66 mg/dL (ref 0.50–1.10)
GFR, Est African American: >89 mL/min
GFR, Est Non African American: 85 mL/min
GLUCOSE: 99 mg/dL (ref 70–99)
Potassium: 4.7 mEq/L (ref 3.5–5.3)
SODIUM: 132 meq/L — AB (ref 135–145)
TOTAL PROTEIN: 6.3 g/dL (ref 6.0–8.3)

## 2015-01-31 LAB — CK: CK TOTAL: 96 U/L (ref 7–177)

## 2015-01-31 LAB — AMYLASE: AMYLASE: 53 U/L (ref 0–105)

## 2015-01-31 LAB — LIPASE: Lipase: 19 U/L (ref 0–75)

## 2015-01-31 LAB — SEDIMENTATION RATE: Sed Rate: 5 mm/hr (ref 0–30)

## 2015-01-31 MED ORDER — PREDNISONE 20 MG PO TABS: 40.0000 mg | ORAL_TABLET | Freq: Every day | ORAL | Status: DC

## 2015-02-01 ENCOUNTER — Telehealth: Payer: Self-pay | Admitting: *Deleted

## 2015-02-01 NOTE — Telephone Encounter (Signed)
Ok to call Pt department and put on hold.

## 2015-02-01 NOTE — Telephone Encounter (Signed)
Dr Gardiner Ramus & Kenney Houseman, patient called in requesting that her physical therapy be held until she gets done with her court dates. She stated that the prednisone is helping.

## 2015-02-07 ENCOUNTER — Ambulatory Visit: Payer: Self-pay | Admitting: Family Medicine

## 2015-02-11 ENCOUNTER — Other Ambulatory Visit: Payer: Self-pay | Admitting: Family Medicine

## 2015-03-04 ENCOUNTER — Other Ambulatory Visit: Payer: Self-pay | Admitting: Family Medicine

## 2015-03-05 ENCOUNTER — Telehealth: Payer: Self-pay | Admitting: Family Medicine

## 2015-03-05 NOTE — Telephone Encounter (Signed)
Barnetta Chapel,  Since you will be here on Wednesday morning would you mind taking care of this patient's xanax Rx since I've never seen her and I doubt one day of a delay is going to cause any problem? Reference Rx in your inbox near [employer].

## 2015-03-06 MED ORDER — ALPRAZOLAM 0.5 MG PO TABS
0.5000 mg | ORAL_TABLET | Freq: Two times a day (BID) | ORAL | Status: DC
Start: 2015-03-06 — End: 2015-05-28

## 2015-03-06 NOTE — Telephone Encounter (Signed)
Call pt: Script printed. Signed. Ok to fax.

## 2015-03-13 ENCOUNTER — Telehealth: Payer: Self-pay | Admitting: *Deleted

## 2015-03-13 DIAGNOSIS — I714 Abdominal aortic aneurysm, without rupture, unspecified: Secondary | ICD-10-CM

## 2015-03-13 DIAGNOSIS — I723 Aneurysm of iliac artery: Secondary | ICD-10-CM

## 2015-03-13 NOTE — Telephone Encounter (Addendum)
Left message for pt to call  She is due to have a follow up CTA of abdomen and pelvis for AAA and iliac aneurysm.

## 2015-03-21 ENCOUNTER — Other Ambulatory Visit: Payer: Self-pay | Admitting: Family Medicine

## 2015-03-21 NOTE — Telephone Encounter (Signed)
Spoke with pt, she is to have a CT of the chest with dr Roxy Manns in September. He had told her he can include the abdomen and pelvis when he does the chest. She does not want to have any further testing at this time. Patient voiced understanding of the need for follow up and knows there is danger in waiting for the scan to be done in September. Will make dr Stanford Breed aware.

## 2015-03-28 NOTE — Telephone Encounter (Signed)
Pt called,she wants to talk to you. Pt says she have decided to have the test.j

## 2015-03-28 NOTE — Telephone Encounter (Signed)
Spoke with pt, she has decided to go ahead with the testing. Orders placed, labs will be completed in the [place] office.

## 2015-03-28 NOTE — Telephone Encounter (Signed)
CTA of abdomen and pelvis 04-04-15 at the [place] location. Pt aware

## 2015-03-28 NOTE — Addendum Note (Signed)
Addended by: Cristopher Estimable on: 03/28/2015 11:24 AM   Modules accepted: Orders

## 2015-04-03 LAB — BASIC METABOLIC PANEL WITH GFR
BUN: 11 mg/dL (ref 6–23)
CO2: 25 mEq/L (ref 19–32)
CREATININE: 0.67 mg/dL (ref 0.50–1.10)
Calcium: 9.2 mg/dL (ref 8.4–10.5)
Chloride: 94 mEq/L — ABNORMAL LOW (ref 96–112)
GFR, EST NON AFRICAN AMERICAN: 85 mL/min
Glucose, Bld: 107 mg/dL — ABNORMAL HIGH (ref 70–99)
Potassium: 4.1 mEq/L (ref 3.5–5.3)
Sodium: 129 mEq/L — ABNORMAL LOW (ref 135–145)

## 2015-04-04 ENCOUNTER — Ambulatory Visit (HOSPITAL_BASED_OUTPATIENT_CLINIC_OR_DEPARTMENT_OTHER)
Admission: RE | Admit: 2015-04-04 | Discharge: 2015-04-04 | Disposition: A | Discharge status: Home / Self Care | Payer: Medicare Other | Source: Ambulatory Visit | Attending: Cardiology | Admitting: Cardiology

## 2015-04-04 ENCOUNTER — Ambulatory Visit (HOSPITAL_BASED_OUTPATIENT_CLINIC_OR_DEPARTMENT_OTHER): Payer: Medicare Other

## 2015-04-04 DIAGNOSIS — I774 Celiac artery compression syndrome: Secondary | ICD-10-CM | POA: Insufficient documentation

## 2015-04-04 DIAGNOSIS — I714 Abdominal aortic aneurysm, without rupture, unspecified: Secondary | ICD-10-CM

## 2015-04-04 DIAGNOSIS — K838 Other specified diseases of biliary tract: Secondary | ICD-10-CM | POA: Insufficient documentation

## 2015-04-04 DIAGNOSIS — I723 Aneurysm of iliac artery: Secondary | ICD-10-CM | POA: Diagnosis not present

## 2015-04-04 MED ORDER — IOHEXOL 350 MG/ML SOLN
100.0000 mL | Freq: Once | INTRAVENOUS | Status: AC | PRN
  Administered 2015-04-04: 100 mL via INTRAVENOUS

## 2015-04-08 ENCOUNTER — Other Ambulatory Visit: Payer: Self-pay | Admitting: *Deleted

## 2015-04-08 ENCOUNTER — Other Ambulatory Visit: Payer: Self-pay | Admitting: Family Medicine

## 2015-04-08 ENCOUNTER — Encounter: Payer: Self-pay | Admitting: Physician Assistant

## 2015-04-08 DIAGNOSIS — R109 Unspecified abdominal pain: Secondary | ICD-10-CM

## 2015-04-08 DIAGNOSIS — R935 Abnormal findings on diagnostic imaging of other abdominal regions, including retroperitoneum: Secondary | ICD-10-CM

## 2015-04-10 ENCOUNTER — Telehealth: Payer: Self-pay | Admitting: Family Medicine

## 2015-04-10 DIAGNOSIS — E871 Hypo-osmolality and hyponatremia: Secondary | ICD-10-CM

## 2015-04-10 NOTE — Telephone Encounter (Signed)
Please call patient: I saw the labs that were done by Dr. Stanford Breed. I know she has had low sodium for quite some time. I look back to see if she ever had any additional workup to figure out the cause of it and I did not see anything. Please ask her if she has had this evaluated before or by another provider.

## 2015-04-10 NOTE — Telephone Encounter (Signed)
Pt stated that she has not and that Dr. Stanford Breed has referred her to GI with regards to her Bile ducts.Laurie [name] [place]

## 2015-04-12 NOTE — Telephone Encounter (Signed)
Ok, lab slip rpinted. Will need extra labs and urine

## 2015-04-17 NOTE — Telephone Encounter (Signed)
Pt informed via VM.Laurie [name] [place]

## 2015-04-19 LAB — BASIC METABOLIC PANEL
BUN: 9 mg/dL (ref 6–23)
CALCIUM: 9 mg/dL (ref 8.4–10.5)
CO2: 25 mEq/L (ref 19–32)
Chloride: 97 mEq/L (ref 96–112)
Creat: 0.6 mg/dL (ref 0.50–1.10)
Glucose, Bld: 97 mg/dL (ref 70–99)
Potassium: 4.4 mEq/L (ref 3.5–5.3)
Sodium: 133 mEq/L — ABNORMAL LOW (ref 135–145)

## 2015-04-19 LAB — SODIUM, URINE, RANDOM: SODIUM UR: 20 meq/L

## 2015-04-19 LAB — HEMOGLOBIN A1C
HEMOGLOBIN A1C: 6.2 % — AB (ref <5.7)
Mean Plasma Glucose: 131 mg/dL — ABNORMAL HIGH (ref <117)

## 2015-04-19 LAB — OSMOLALITY: Osmolality: 276 mOsm/kg (ref 275–300)

## 2015-04-19 LAB — OSMOLALITY, URINE: OSMOLALITY UR: 175 mosm/kg — AB (ref 390–1090)

## 2015-04-25 ENCOUNTER — Other Ambulatory Visit (INDEPENDENT_AMBULATORY_CARE_PROVIDER_SITE_OTHER): Payer: Medicare Other

## 2015-04-25 ENCOUNTER — Encounter: Payer: Self-pay | Admitting: Physician Assistant

## 2015-04-25 ENCOUNTER — Ambulatory Visit (INDEPENDENT_AMBULATORY_CARE_PROVIDER_SITE_OTHER): Payer: Medicare Other | Admitting: Physician Assistant

## 2015-04-25 VITALS — BP 116/68 | HR 80 | Ht 67.0 in | Wt 166.0 lb

## 2015-04-25 DIAGNOSIS — R1013 Epigastric pain: Secondary | ICD-10-CM

## 2015-04-25 DIAGNOSIS — E785 Hyperlipidemia, unspecified: Secondary | ICD-10-CM

## 2015-04-25 DIAGNOSIS — Z5181 Encounter for therapeutic drug level monitoring: Secondary | ICD-10-CM

## 2015-04-25 DIAGNOSIS — I10 Essential (primary) hypertension: Secondary | ICD-10-CM

## 2015-04-25 DIAGNOSIS — K8689 Other specified diseases of pancreas: Secondary | ICD-10-CM

## 2015-04-25 DIAGNOSIS — K5900 Constipation, unspecified: Secondary | ICD-10-CM

## 2015-04-25 DIAGNOSIS — K868 Other specified diseases of pancreas: Secondary | ICD-10-CM

## 2015-04-25 DIAGNOSIS — K859 Acute pancreatitis without necrosis or infection, unspecified: Secondary | ICD-10-CM

## 2015-04-25 LAB — CBC WITH DIFFERENTIAL/PLATELET
BASOS PCT: 0.4 % (ref 0.0–3.0)
Basophils Absolute: 0 10*3/uL (ref 0.0–0.1)
EOS ABS: 0.1 10*3/uL (ref 0.0–0.7)
Eosinophils Relative: 1.5 % (ref 0.0–5.0)
HEMATOCRIT: 46.1 % — AB (ref 36.0–46.0)
Hemoglobin: 15.5 g/dL — ABNORMAL HIGH (ref 12.0–15.0)
LYMPHS PCT: 15.3 % (ref 12.0–46.0)
Lymphs Abs: 1.6 10*3/uL (ref 0.7–4.0)
MCHC: 33.7 g/dL (ref 30.0–36.0)
MCV: 87.2 fl (ref 78.0–100.0)
MONO ABS: 0.9 10*3/uL (ref 0.1–1.0)
MONOS PCT: 8.5 % (ref 3.0–12.0)
Neutro Abs: 7.5 10*3/uL (ref 1.4–7.7)
Neutrophils Relative %: 74.3 % (ref 43.0–77.0)
Platelets: 318 10*3/uL (ref 150.0–400.0)
RBC: 5.29 Mil/uL — AB (ref 3.87–5.11)
RDW: 13.1 % (ref 11.5–15.5)
WBC: 10.1 10*3/uL (ref 4.0–10.5)

## 2015-04-25 LAB — COMPREHENSIVE METABOLIC PANEL
ALK PHOS: 76 U/L (ref 39–117)
ALT: 16 U/L (ref 0–35)
AST: 16 U/L (ref 0–37)
Albumin: 4.2 g/dL (ref 3.5–5.2)
BILIRUBIN TOTAL: 0.8 mg/dL (ref 0.2–1.2)
BUN: 9 mg/dL (ref 6–23)
CO2: 28 mEq/L (ref 19–32)
Calcium: 9.6 mg/dL (ref 8.4–10.5)
Chloride: 96 mEq/L (ref 96–112)
Creatinine, Ser: 0.69 mg/dL (ref 0.40–1.20)
GFR: 87.46 mL/min (ref >60.00)
GLUCOSE: 107 mg/dL — AB (ref 70–99)
POTASSIUM: 4.5 meq/L (ref 3.5–5.1)
SODIUM: 130 meq/L — AB (ref 135–145)
Total Protein: 7 g/dL (ref 6.0–8.3)

## 2015-04-25 LAB — PROTIME-INR
INR: 1 ratio (ref 0.8–1.0)
Prothrombin Time: 11.5 s (ref 9.6–13.1)

## 2015-04-25 LAB — LIPASE: LIPASE: 26 U/L (ref 11.0–59.0)

## 2015-04-25 MED ORDER — POLYETHYLENE GLYCOL 3350 17 GM/SCOOP PO POWD: ORAL | Status: DC

## 2015-04-25 MED ORDER — TRAMADOL HCL 50 MG PO TABS: 50.0000 mg | ORAL_TABLET | Freq: Three times a day (TID) | ORAL | Status: DC | PRN

## 2015-04-25 NOTE — Progress Notes (Signed)
Patient ID: Laurie [name], female   DOB: 19-Nov-1935, 80 y.o.   MRN: [id number]    HPI:  Laurie [name] is a 80 y.o.   female  referred by Laurie [name], * for evaluation of abnormal bile ducts on CT scan.  Laurie [name] has past history of aortic stenosis status post aortic valve replacement in September 2009. She had a pericardial tissue valve. She has also had a septal myectomy and a right middle lobectomy due to lung cancer. She has a history of COPD postoperative atrial fibrillation, hypertension, CVA, abdominal aortic aneurysm, iliac aneurysm and coronary artery disease. She states she has seen a gastroenterologist Dr. Gracelyn Nurse in [place] in the past. She reports that in 2005 she was anemic and had a colonoscopy, upper endoscopy, and pill endoscopy and was found to have AVMs. She says it was at that time she was diagnosed with aortic stenosis and subsequently had an aortic valve replacement.  She states that on 12/13/2013 she had a laparoscopic cholecystectomy by Dr. Rosendo Gros. She reports that since that time she has had ongoing epigastric pain that has become more severe over the past 2 months. She states her pain is constant and not exacerbated with ingestion of food. It is not alleviated with defecation. It is not associated with nausea or vomiting. She states her pain is a 10 on a scale of 1-10. She has no associated fever or chills. Her appetite has been decreased for a month and she states on her home scale she has lost 10 or 11 pounds in the past 4 weeks. Recently she evaluated by Dr. Stanford Breed for follow-up of her aneurysm and sent for a CTA of the abdomen and pelvis with and without contrast. This revealed intra-and extrahepatic biliary ductal dilatation with slight increased diameter of the common bile duct to 1.4 cm there is abrupt distal tapering which may represent a stricture.  Laurie [name] also states that she has been very constipated for the past few years. She  skips 3 or 4 days between bowel movements and then passes hard nugget like stools. She has tried fiber supplementation and increasing her water intake with no relief. She has had no bright red blood per rectum or melena.   Past Medical History  Diagnosis Date  . Anemia     iron deficiency  . Shingles 11-09  . COPD (chronic obstructive pulmonary disease) 2005    on CXR with R apical scaring  . Atrial fibrillation     post op  . Hypertension   . Cerebrovascular disease   . S/P aortic valve replacement 08/28/2008    [facility]  . S/P lobectomy of lung 08/28/2008    rt middle lobe  dr. Roxy Manns  . AAA (abdominal aortic aneurysm)   . CAD (coronary artery disease)   . Hypercholesterolemia     Hx: of  . Headache(784.0)     Hx: of migraines in the past  . Carcinoma of lung 08/28/2008    T2N0 moderately diff. squamous cell CA  . Ascending aorta dilatation     Past Surgical History  Procedure Laterality Date  . Aapy    . Tonsillectomy and adenoidectomy    . U/s guided aspiration of r breast cyst   2009    at the [facility] clinic  . Rml removed dr Ricard Dillon for lung cancer  08/28/2008    T2N0 squamous cell CA  . Cardiac valve replacement    . Abdominal surgery    . Tonsillectomy    .  [facility]ortic [facility]  [facility]28[facility]2008    [facility]  . Cardiac valve surgery  08/28/2008    septal myomectomy  . Colonoscopy      Hx: of  . Cataract extraction w/ intraocular lens  implant, bilateral      Hx:of  . Dilation and curettage of uterus    . Cardiac catheterization    . Appendectomy    . Cholecystectomy N/A 12/13/2013    Procedure: LAPAROSCOPIC CHOLECYSTECTOMY;  Surgeon: ARalene Ok MD;  [facility];  Service: General;  Laterality: N/A;   Family History  Problem Relation Age of Onset  . Stroke Father   . Diabetes Son     brittle diabetes   History  Substance Use Topics  . Smoking status: Former Smoker -- 40 years  . Smokeless tobacco: Never Used    . Alcohol Use: No   Current Outpatient Prescriptions  Medication Sig Dispense Refill  . acetaminophen (TYLENOL) 500 MG tablet Take 500 mg by mouth every 6 (six) hours as needed. Pain      . ALPRAZolam (XANAX) 0.5 MG tablet Take 1 tablet (0.5 mg total) by mouth 2 (two) times daily. 60 tablet 1  . aspirin 81 MG tablet Take 81 mg by mouth daily.      .Marland Kitchenlisinopril (PRINIVIL,ZESTRIL) 40 MG tablet Take 1 tablet (40 mg total) by mouth daily. 90 tablet 3  . metoprolol (LOPRESSOR) 50 MG tablet Take 1.5 tablets (75 mg total) by mouth 2 (two) times daily. 270 tablet 3  . polyethylene glycol powder (GLYCOLAX/MIRALAX) powder Take 1-2 capfuls daily in water 255 g 3  . traMADol (ULTRAM) 50 MG tablet Take 1 tablet (50 mg total) by mouth every 8 (eight) hours as needed. 30 tablet 0   No current facility-administered medications for this visit.   Allergies  Allergen Reactions  . Statins     Muscle aches  . Rofecoxib Rash     Review of Systems: Gen: Denies any fever, chills, admits to diminished appetite and weight loss CV: Denies chest pain, angina, palpitations, syncope, orthopnea, PND, peripheral edema, and claudication. Resp: Denies dyspnea at rest, dyspnea with exercise, cough, sputum, wheezing, coughing up blood, and pleurisy. GI: Denies vomiting blood, jaundice, and fecal incontinence.   Denies dysphagia or odynophagia. GU : Denies urinary burning, blood in urine, urinary frequency, urinary hesitancy, nocturnal urination, and urinary incontinence. MS: Denies joint pain, limitation of movement, and swelling, stiffness, low back pain, extremity pain. Denies muscle weakness, cramps, atrophy.  Derm: Denies rash, itching, dry skin, hives, moles, warts, or unhealing ulcers.  Psych: Denies depression, anxiety, memory loss, suicidal ideation, hallucinations, paranoia, and confusion. Heme: Denies bruising, bleeding, and enlarged lymph nodes. Neuro:  Denies any headaches, dizziness, paresthesias. Endo:   Denies any problems with DM, thyroid, adrenal function  Studies: Ct Angio Abd/pel W/ And/or W/o  04/04/2015   CLINICAL DATA:  80year old female with a history of abdominal aortic aneurysm. Iliac artery aneurysm.  EXAM: CTA ABDOMEN AND PELVIS wITHOUT AND WITH CONTRAST  TECHNIQUE: Multidetector CT imaging of the abdomen and pelvis was performed using the standard protocol during bolus administration of intravenous contrast. Multiplanar reconstructed images and MIPs were obtained and reviewed to evaluate the vascular anatomy.  CONTRAST:  1071mOMNIPAQUE IOHEXOL 350 MG/ML SOLN  COMPARISON:  Multiple prior CT.  Most recent 03/21/2014  FINDINGS: Lower chest:  Unremarkable appearance the superficial soft tissues of the chest wall.  Unremarkable appearance of visualized heart. No pericardial fluid/  thickening.  Hiatal hernia.  Respiratory motion somewhat limits evaluation of the lungs for small nodules. Persisting nodule at the base of the left lung is unchanged from the most recent CT dated 03/21/2014, measuring 5 mm.  Linear scarring at the bilateral lung bases.  No confluent airspace disease.  Abdomen/pelvis:  Craniocaudal span of the liver measures 21.2 cm. Unremarkable appearance of the liver parenchyma.  Intrahepatic biliary ductal dilatation is similar to comparison studies.  Extrahepatic biliary ductal dilatation is similar to comparison studies. Greatest diameter of the common bile duct measures 1.4 cm. This measures slightly greater than the comparison. There is abrupt tapering distally.  Similar appearance of pancreatic duct dilation to the comparison study most recently.  Unremarkable appearance of pancreatic parenchyma.  Unremarkable appearance of spleen.  Unremarkable appearance of right adrenal gland. Soft tissue nodule of the left adrenal gland measures 2.2 cm, unchanged, characterized as an adenoma.  No hydronephrosis or nephrolithiasis.  Unremarkable appearance of the course of the bilateral ureters.   No abnormally distended small bowel or colon.  Colonic diverticula. No evidence of inflammatory changes of the adjacent mesentery.  No free fluid or free intraperitoneal air.  Unremarkable appearance of the uterus and adnexa. Somewhat engorged draining veins of the left adnexa.  Vascular:  Aorta:  Greatest diameter of the distal thoracic aorta measures 3.3 cm, unchanged from prior. Irregular intimal surface compatible of atherosclerotic changes.  Greatest diameter of the aorta at the renal artery origin measures 2.0 cm. Greatest diameter of the infrarenal abdominal aorta 3.3 cm (image 63 of series 5) which is unchanged from the comparison CT of 1 year prior.  Diameter of the distal aorta measures 1.1 cm just above the bifurcation.  Mesenteric vessels:  Soft and calcified plaque at the origin of the celiac artery, which is stenotic.  Superior mesenteric artery remains widely patent.  Bilateral single renal arteries are patent without significant stenosis at the origin. Inferior mesenteric artery remains patent.  Right lower extremity:  50% narrowing at the origin of the right common iliac artery with tortuosity of the right iliac system. Greatest diameter of the right common iliac artery measures 1.6 cm. Right hypogastric artery remains patent with atherosclerotic changes. Right external iliac artery remains patent. Calcifications of the right common femoral artery which is patent. Profunda femoris is patent.  Left lower extremity:  Calcifications at the origin of the left common iliac artery. Greatest diameter of the left common iliac artery measures 9 mm. Tortuosity of the left iliac system. Left hypogastric artery is patent. No aneurysm of the left external iliac artery. Calcifications the left common femoral artery. Profunda femoris and SFA remain patent proximally.  Musculoskeletal:  No displaced fracture.  Degenerative changes of the lumbar spine including multilevel degenerative disc disease. No bony canal  narrowing identified.  Sclerotic focus of the right iliac bone is unchanged from comparison studies, likely a benign lesion such as a bone island.  Review of the MIP images confirms the above findings.  IMPRESSION: No acute finding of the abdomen/ pelvis CT.  Re- demonstration of atherosclerotic changes of the aorta with distal thoracic aortic ectasia (greatest diameter 3.3 cm), and infrarenal abdominal aortic aneurysm (greatest diameter 3.3 cm).  Re- demonstration of right common iliac artery aneurysm measuring 1.6 cm. There is approximately 50% stenosis of the right common iliac artery origin.  Atherosclerotic changes at the origin of the mesenteric vessels, including stenosis of the celiac artery origin.  Intra and extrahepatic biliary ductal dilatation, with slight increased  diameter of the common bile duct to 1.4 cm. There is abrupt distal tapering, which may represent a stricture. Correlation with ERCP/ MRCP may be useful if not already completed to exclude soft tissue/mass.  Additional incidental findings as above.  Signed,  Dulcy Fanny. Earleen Newport, DO  [facility]  [name]   Electronically Signed   By: Corrie Mckusick D.O.   On: 04/04/2015 16:45      Physical Exam: BP 116/68 mmHg  Pulse 80  Ht '5\' 7"'$  (1.702 m)  Wt 166 lb (75.297 kg)  BMI 25.99 kg/m2 Constitutional: Pleasant,well-developed female in no acute distress. HEENT: Normocephalic and atraumatic. Conjunctivae are normal. No scleral icterus. Neck supple. No cervical adenopathy Cardiovascular: Normal rate, regular rhythm. 2/6 systolic murmur Pulmonary/chest: Decreased breath sounds bilaterally Abdominal: Soft, nondistended, tender epigastric area Bowel sounds active throughout. There are no masses palpable. No hepatomegaly. Extremities: no edema Lymphadenopathy: No cervical adenopathy noted. Neurological: Alert and oriented to person place and time. Skin: Skin is warm and dry. No rashes  noted. Psychiatric: Normal mood and affect. Behavior is normal.  ASSESSMENT AND PLAN: 80 year old female status post laparoscopic cholecystectomy in January 2015 with complaints of progressive epigastric pain found to have increased diameter of the common bile duct with abrupt distal tapering suggestive of a stricture. Patient has been advised to go undergo an ERCP for further evaluation.The risks, benefits, and possible complications of the procedure, including  But not limited to bleeding, perforation, surgery, and the 5-10% risk for pancreatitis, were explained to the patient.  It was explained that  post ERCP pancreatitis which, while usually is mild, occasionally maybe severe and even life-threatening.  Patient's questions were answered. The procedure will be scheduled with Dr. Deatra Ina. He CBC, comprehensive metabolic panel, PT/INR will be obtained today.  With regards to her constipation. Patient will be given a mere a lax purge today. She will then start to use Mira lax one to 2 capfuls daily and titrate to the desired effect. She's been advised to increase fiber and water intake in her diet.  Further recommendations will be made pending the findings of her ERCP.    Kinta Martis, Vita Barley PA-C 04/25/2015, 11:25 AM  CC: Laurie [name], *

## 2015-04-25 NOTE — Patient Instructions (Addendum)
You have been scheduled for an ERCP. 05/08/2015 at 130 at [place].  Please follow written instructions given to you at your visit today. If you use inhalers (even only as needed), please bring them with you on the day of your procedure.  Your physician has requested that you go to the basement for the following lab work before leaving today:  Lori Hvozdovic, PA recommends that you complete a bowel purge (to clean out your bowels). Please do the following: Purchase a bottle of Miralax over the counter as well as a box of 5 mg dulcolax tablets. Take 4 dulcolax tablets. Wait 1 hour. You will then drink 6-8 capfuls of Miralax mixed in an adequate amount of water/juice/gatorade (you may choose which of these liquids to drink) over the next 2-3 hours. You should expect results within 1 to 6 hours after completing the bowel purge.  After purge, take miralax 1-2 capful daily in water.  "P" fruits, peaches, pears, prunes, plums, pinapple and pear juice.  We have printed a Tramadol rx for you.

## 2015-04-26 NOTE — Progress Notes (Signed)
Reviewed and agree with management. Robert D. Kaplan, M.D., FACG  

## 2015-05-07 NOTE — Progress Notes (Signed)
Unable to contact pt; lvm with pre-op instructions according to pre-op checklist.

## 2015-05-08 ENCOUNTER — Encounter (HOSPITAL_COMMUNITY): Payer: Self-pay

## 2015-05-08 ENCOUNTER — Ambulatory Visit (HOSPITAL_COMMUNITY)
Admission: RE | Admit: 2015-05-08 | Discharge: 2015-05-08 | Disposition: A | Discharge status: Home / Self Care | Payer: Medicare Other | Source: Ambulatory Visit | Attending: Gastroenterology | Admitting: Gastroenterology

## 2015-05-08 ENCOUNTER — Other Ambulatory Visit: Payer: Self-pay | Admitting: Family Medicine

## 2015-05-08 ENCOUNTER — Encounter (HOSPITAL_COMMUNITY)
Admission: RE | Disposition: A | Discharge status: Home / Self Care | Payer: Self-pay | Source: Ambulatory Visit | Attending: Gastroenterology

## 2015-05-08 ENCOUNTER — Ambulatory Visit (HOSPITAL_COMMUNITY): Payer: Medicare Other | Admitting: Anesthesiology

## 2015-05-08 ENCOUNTER — Ambulatory Visit (HOSPITAL_COMMUNITY): Payer: Medicare Other

## 2015-05-08 ENCOUNTER — Other Ambulatory Visit: Payer: Self-pay | Admitting: Physician Assistant

## 2015-05-08 DIAGNOSIS — R1011 Right upper quadrant pain: Secondary | ICD-10-CM | POA: Diagnosis present

## 2015-05-08 DIAGNOSIS — K8689 Other specified diseases of pancreas: Secondary | ICD-10-CM

## 2015-05-08 DIAGNOSIS — R1010 Upper abdominal pain, unspecified: Secondary | ICD-10-CM | POA: Diagnosis not present

## 2015-05-08 DIAGNOSIS — R933 Abnormal findings on diagnostic imaging of other parts of digestive tract: Secondary | ICD-10-CM

## 2015-05-08 DIAGNOSIS — K828 Other specified diseases of gallbladder: Secondary | ICD-10-CM | POA: Insufficient documentation

## 2015-05-08 DIAGNOSIS — R932 Abnormal findings on diagnostic imaging of liver and biliary tract: Secondary | ICD-10-CM

## 2015-05-08 HISTORY — PX: ERCP: SHX5425

## 2015-05-08 SURGERY — ERCP, WITH INTERVENTION IF INDICATED
Anesthesia: General

## 2015-05-08 MED ORDER — CIPROFLOXACIN IN D5W 400 MG/200ML IV SOLN
INTRAVENOUS | Status: AC
  Filled 2015-05-08: qty 200

## 2015-05-08 MED ORDER — FENTANYL CITRATE (PF) 100 MCG/2ML IJ SOLN
INTRAMUSCULAR | Status: DC | PRN
  Administered 2015-05-08: 100 ug via INTRAVENOUS

## 2015-05-08 MED ORDER — EPHEDRINE SULFATE 50 MG/ML IJ SOLN
INTRAMUSCULAR | Status: DC | PRN
  Administered 2015-05-08: 15 mg via INTRAVENOUS

## 2015-05-08 MED ORDER — LACTATED RINGERS IV SOLN
INTRAVENOUS | Status: DC
Start: 2015-05-08 — End: 2015-05-08
  Administered 2015-05-08: 1000 mL via INTRAVENOUS

## 2015-05-08 MED ORDER — ONDANSETRON HCL 4 MG/2ML IJ SOLN
INTRAMUSCULAR | Status: DC | PRN
  Administered 2015-05-08: 4 mg via INTRAVENOUS

## 2015-05-08 MED ORDER — PROPOFOL 10 MG/ML IV BOLUS
INTRAVENOUS | Status: DC | PRN
  Administered 2015-05-08: 120 mg via INTRAVENOUS

## 2015-05-08 MED ORDER — GLUCAGON HCL RDNA (DIAGNOSTIC) 1 MG IJ SOLR
INTRAMUSCULAR | Status: DC | PRN
  Administered 2015-05-08: 1 mg via INTRAVENOUS

## 2015-05-08 MED ORDER — SODIUM CHLORIDE 0.9 % IV SOLN
INTRAVENOUS | Status: DC | PRN
  Administered 2015-05-08: 45 mL

## 2015-05-08 MED ORDER — INDOMETHACIN 50 MG RE SUPP
100.0000 mg | Freq: Once | RECTAL | Status: AC
  Administered 2015-05-08: 100 mg via RECTAL
  Filled 2015-05-08: qty 2

## 2015-05-08 MED ORDER — SUCCINYLCHOLINE CHLORIDE 20 MG/ML IJ SOLN
INTRAMUSCULAR | Status: DC | PRN
  Administered 2015-05-08: 100 mg via INTRAVENOUS

## 2015-05-08 MED ORDER — GLUCAGON HCL RDNA (DIAGNOSTIC) 1 MG IJ SOLR
INTRAMUSCULAR | Status: AC
  Filled 2015-05-08: qty 1

## 2015-05-08 MED ORDER — SODIUM CHLORIDE 0.9 % IV SOLN: INTRAVENOUS | Status: DC

## 2015-05-08 MED ORDER — LIDOCAINE HCL (CARDIAC) 20 MG/ML IV SOLN
INTRAVENOUS | Status: DC | PRN
Start: 2015-05-08 — End: 2015-05-08
  Administered 2015-05-08: 80 mg via INTRAVENOUS
  Administered 2015-05-08: 2 mg via INTRAVENOUS

## 2015-05-08 MED ORDER — GLYCOPYRROLATE 0.2 MG/ML IJ SOLN
INTRAMUSCULAR | Status: DC | PRN
  Administered 2015-05-08: 0.2 mg via INTRAVENOUS

## 2015-05-08 MED ORDER — CIPROFLOXACIN IN D5W 400 MG/200ML IV SOLN
400.0000 mg | Freq: Once | INTRAVENOUS | Status: AC
  Administered 2015-05-08: 400 mg via INTRAVENOUS

## 2015-05-08 MED ORDER — HYOSCYAMINE SULFATE ER 0.375 MG PO TB12: ORAL_TABLET | ORAL | Status: DC

## 2015-05-08 NOTE — Op Note (Signed)
[facility] [place], [id number]   ERCP PROCEDURE REPORT        EXAM DATE: 2015-05-21  PATIENT NAME:          Laurie [name], Laurie [name]          MR #: [id number] BIRTHDATE:       05/11/1935     VISIT #:     [phone] ATTENDING:     Inda Castle, MD     STATUS:     outpatient ASSISTANT:      [place] [facility] and Cleda Daub  INDICATIONS:  The patient is a 80 yr old female here for an ERCP due to abdominal pain in upper right quadrant and abnormal abdominal CT. eating diffuse dilatation of the biliary system with a possible distal stricture PROCEDURE PERFORMED:     ERCP with sphincterotomy/papillotomy  MEDICATIONS:     Per Anesthesia and cipro '400mg'$  IV  CONSENT: The patient understands the risks and benefits of the procedure and understands that these risks include, but are not limited to: sedation, allergic reaction, infection, perforation and/or bleeding. Alternative means of evaluation and treatment include, among others: physical exam, x-rays, and/or surgical intervention. The patient elects to proceed with this endoscopic procedure.  DESCRIPTION OF PROCEDURE: During intra-op preparation period all mechanical & medical equipment was checked for proper function. Hand hygiene and appropriate measures for infection prevention was taken. After the risks, benefits and alternatives of the procedure were thoroughly explained, Informed was verified, confirmed and timeout was successfully executed by the treatment team. With the patient in left semi-prone position, medications were administered intravenously.The Pentax ERCP [id number] was passed from the mouth into the esophagus and further advanced from the esophagus into the stomach. From stomach scope was directed to the third portion of the duodenum.  Major papilla was aligned with the duodenoscope. The scope position was confirmed fluoroscopically. Rest of the findings/therapeutics  are given below. The scope was then completely withdrawn from the patient and the procedure completed. The pulse, BP, and O2 saturation were monitored and documented by the physician and the nursing staff throughout the entire procedure. The patient was cared for as planned according to standard protocol. The patient was then discharged to recovery in stable condition and with appropriate post procedure care. Estimated blood loss is zero unless otherwise noted in this procedure report.  There was a large periampullary diverticulum.   A distinct papilla was not identified.  Along the lateral wall of the diverticulum I blindly passed a 0.35 mm guidewire that immediately entered the common bile duct.  Selective injection of the common bile duct demonstrated diffuse dilation down to the level of the ampulla.  No frank strictures were seen.  The duct was swept multiple times with a 15 mm balloon stone extractor and no stones were seen.  In order to facilitate identification of the papilla a 12-15 mm sphincterotomy was made.  There was prompt emptying of bile observed.    ADVERSE EVENT: IMPRESSIONS:     diffuse dilation of the biliary system without obvious stricture  Etiology for upper abdominal pain uncertain  RECOMMENDATIONS:     Trial of hyomax 0.'375mg'$  bid for 5 days OV 3-4 weeks REPEAT EXAM:   ___________________________________ Inda Castle, MD eSigned:  Inda Castle, MD 2015-05-21 3:24 PM   cc:  CPT CODES: ICD9 CODES:  The ICD and CPT codes recommended by this software are interpretations from the data that the clinical staff has  captured with the software.  The verification of the translation of this report to the ICD and CPT codes and modifiers is the sole responsibility of the health care institution and practicing physician where this report was generated.  [facility]. will not be held responsible for the validity of the ICD and CPT codes  included on this report.  AMA assumes no liability for data contained or not contained herein. CPT is a Designer, television/film set of the [employer].   PATIENT NAME:  Laurie [name], Laurie [name] MR#: [id number]

## 2015-05-08 NOTE — Transfer of Care (Signed)
Immediate Anesthesia Transfer of Care Note  Patient: Laurie [name]  Procedure(s) Performed: Procedure(s): ENDOSCOPIC RETROGRADE CHOLANGIOPANCREATOGRAPHY (ERCP) (N/A)  Patient Location: Endoscopy Unit  Anesthesia Type:General  Level of Consciousness: awake  Airway & Oxygen Therapy: Patient Spontanous Breathing and Patient connected to nasal cannula oxygen  Post-op Assessment: Report given to RN, Post -op Vital signs reviewed and stable and Patient moving all extremities  Post vital signs: Reviewed and stable  Last Vitals:  Filed Vitals:   05/08/15 1530  BP: 153/60  Pulse: 91  Temp: 36.7 C  Resp: 17    Complications: No apparent anesthesia complications

## 2015-05-08 NOTE — Anesthesia Postprocedure Evaluation (Signed)
Anesthesia Post Note  Patient: Laurie [name]  Procedure(s) Performed: Procedure(s) (LRB): ENDOSCOPIC RETROGRADE CHOLANGIOPANCREATOGRAPHY (ERCP) (N/A)  Anesthesia type: General  Patient location: PACU  Post pain: Pain level controlled  Post assessment: Post-op Vital signs reviewed  Last Vitals: BP 145/75 mmHg  Pulse 85  Temp(Src) 36.7 C (Oral)  Resp 14  SpO2 93%  Post vital signs: Reviewed  Level of consciousness: sedated  Complications: No apparent anesthesia complications

## 2015-05-08 NOTE — Discharge Instructions (Signed)
Endoscopic Retrograde Cholangiopancreatography (ERCP), Care After °Refer to this sheet in the next few weeks. These instructions provide you with information on caring for yourself after your procedure. Your health care provider may also give you more specific instructions. Your treatment has been planned according to current medical practices, but problems sometimes occur. Call your health care provider if you have any problems or questions after your procedure.  °WHAT TO EXPECT AFTER THE PROCEDURE  °After your procedure, it is typical to feel:  °· Soreness in your throat.   °· Sick to your stomach (nauseous).   °· Bloated. °· Dizzy.   °· Fatigued. °HOME CARE INSTRUCTIONS °· Have a friend or family member stay with you for the first 24 hours after your procedure. °· Start taking your usual medicines and eating normally as soon as you feel well enough to do so or as directed by your health care provider. °SEEK MEDICAL CARE IF: °· You have abdominal pain.   °· You develop signs of infection, such as:   °¨ Chills.   °¨ Feeling unwell.   °SEEK IMMEDIATE MEDICAL CARE IF: °· You have difficulty swallowing. °· You have worsening throat, chest, or abdominal pain. °· You vomit. °· You have bloody or very black stools. °· You have a fever. °Document Released: 09/06/2013 Document Reviewed: 09/06/2013 °Exit[employer]® Patient Information ©2015 [employer]. This information is not intended to replace advice given to you by your health care provider. Make sure you discuss any questions you have with your health care provider. ° °

## 2015-05-08 NOTE — Anesthesia Preprocedure Evaluation (Addendum)
Anesthesia Evaluation  Patient identified by MRN, date of birth, ID band Patient awake    Reviewed: Allergy & Precautions, H&P , NPO status , Patient's Chart, lab work & pertinent test results, reviewed documented beta blocker date and time   Airway Mallampati: I  TM Distance: >3 FB Neck ROM: Full    Dental no notable dental hx. (+) Teeth Intact   Pulmonary neg pulmonary ROS, COPDformer smoker,  breath sounds clear to auscultation  Pulmonary exam normal       Cardiovascular hypertension, Pt. on medications and Pt. on home beta blockers + CAD and + Peripheral Vascular Disease Normal cardiovascular exam+ Valvular Problems/Murmurs AS Rhythm:Regular Rate:Normal  - Left ventricle: The cavity size was normal. Wall thicknesswas increased in a pattern of moderate LVH. Systolicfunction was normal. The estimated ejection fraction wasin the range of 60% to 65%. Wall motion was normal; therewere no regional wall motion abnormalities. Dopplerparameters are consistent with abnormal left ventricularrelaxation (grade 1 diastolic dysfunction). - Aortic valve: There was a bioprosthetic aortic valve. No significant bioprosthetic aortic valve regurgitation or stenosis. Peak gradient: 45m Hg (S). - Mitral valve: No significant regurgitation. - Left atrium: The atrium was mildly dilated. - Right ventricle: The cavity size was normal. Systolic function was normal. - Pulmonary arteries: No complete TR doppler jet so unableto estimate PA systolic pressure. - Inferior vena cava: The vessel was normal in size; the respirophasic diameter changes were in the normal range (=50%); findings are consistent with normal central venouspressure.  Impressions: - Technically difficult study with poor acoustic windows. Normal LV size with moderate LV hypertrophy. EF 60-65%.Normal RV size and systolic function. There was abioprosthetic aortic valve that  appeared to have normalfunction.    Neuro/Psych  Headaches, PSYCHIATRIC DISORDERS Anxiety    GI/Hepatic negative GI ROS, Neg liver ROS,   Endo/Other  negative endocrine ROS  Renal/GU negative Renal ROS     Musculoskeletal negative musculoskeletal ROS (+)   Abdominal   Peds  Hematology  (+) anemia ,   Anesthesia Other Findings   Reproductive/Obstetrics                            Anesthesia Physical  Anesthesia Plan  ASA: III  Anesthesia Plan: General   Post-op Pain Management:    Induction: Intravenous  Airway Management Planned: Oral ETT  Additional Equipment:   Intra-op Plan:   Post-operative Plan: Extubation in OR  Informed Consent: I have reviewed the patients History and Physical, chart, labs and discussed the procedure including the risks, benefits and alternatives for the proposed anesthesia with the patient or authorized representative who has indicated his/her understanding and acceptance.   Dental advisory given  Plan Discussed with: CRNA and Surgeon  Anesthesia Plan Comments:         Anesthesia Quick Evaluation

## 2015-05-08 NOTE — Anesthesia Procedure Notes (Signed)
Procedure Name: Intubation Date/Time: 05/08/2015 2:13 PM Performed by: Melina Copa, Donnita Farina R Pre-anesthesia Checklist: Patient identified, Emergency Drugs available, Suction available, Patient being monitored and Timeout performed Patient Re-evaluated:Patient Re-evaluated prior to inductionOxygen Delivery Method: Circle system utilized Preoxygenation: Pre-oxygenation with 100% oxygen Intubation Type: IV induction Ventilation: Mask ventilation without difficulty Laryngoscope Size: Mac Grade View: Grade II Tube type: Oral Tube size: 7.0 mm Number of attempts: 1 Airway Equipment and Method: Stylet Placement Confirmation: ETT inserted through vocal cords under direct vision and positive ETCO2 Secured at: 21 cm Tube secured with: Tape Dental Injury: Teeth and Oropharynx as per pre-operative assessment

## 2015-05-08 NOTE — Anesthesia Postprocedure Evaluation (Signed)
Anesthesia Post Note  Patient: Laurie [name]  Procedure(s) Performed: Procedure(s) (LRB): ENDOSCOPIC RETROGRADE CHOLANGIOPANCREATOGRAPHY (ERCP) (N/A)  Anesthesia type: General  Patient location: PACU  Post pain: Pain level controlled  Post assessment: Post-op Vital signs reviewed  Last Vitals: BP 135/97 mmHg  Pulse 87  Temp(Src) 36.7 C (Oral)  Resp 11  SpO2 94%  Post vital signs: Reviewed  Level of consciousness: sedated  Complications: No apparent anesthesia complications\

## 2015-05-09 ENCOUNTER — Encounter (HOSPITAL_COMMUNITY): Payer: Self-pay | Admitting: Gastroenterology

## 2015-05-10 ENCOUNTER — Other Ambulatory Visit: Payer: Self-pay | Admitting: *Deleted

## 2015-05-10 MED ORDER — TRAMADOL HCL 50 MG PO TABS: 50.0000 mg | ORAL_TABLET | Freq: Three times a day (TID) | ORAL | Status: DC | PRN

## 2015-05-10 NOTE — Telephone Encounter (Deleted)
Prescribed by another provider

## 2015-05-13 ENCOUNTER — Other Ambulatory Visit: Payer: Self-pay

## 2015-05-13 MED ORDER — METOPROLOL TARTRATE 50 MG PO TABS: 75.0000 mg | ORAL_TABLET | Freq: Two times a day (BID) | ORAL | Status: DC

## 2015-05-16 ENCOUNTER — Other Ambulatory Visit: Payer: Self-pay | Admitting: *Deleted

## 2015-05-16 MED ORDER — METOPROLOL TARTRATE 50 MG PO TABS: 75.0000 mg | ORAL_TABLET | Freq: Two times a day (BID) | ORAL | Status: DC

## 2015-05-17 ENCOUNTER — Encounter: Payer: Self-pay | Admitting: Family Medicine

## 2015-05-17 ENCOUNTER — Ambulatory Visit (INDEPENDENT_AMBULATORY_CARE_PROVIDER_SITE_OTHER): Payer: Medicare Other | Admitting: Family Medicine

## 2015-05-17 VITALS — BP 142/84 | HR 75 | Wt 167.0 lb

## 2015-05-17 DIAGNOSIS — K5900 Constipation, unspecified: Secondary | ICD-10-CM

## 2015-05-17 DIAGNOSIS — K2100 Gastro-esophageal reflux disease with esophagitis, without bleeding: Secondary | ICD-10-CM

## 2015-05-17 DIAGNOSIS — I10 Essential (primary) hypertension: Secondary | ICD-10-CM | POA: Diagnosis not present

## 2015-05-17 DIAGNOSIS — R109 Unspecified abdominal pain: Secondary | ICD-10-CM

## 2015-05-17 DIAGNOSIS — K5909 Other constipation: Secondary | ICD-10-CM

## 2015-05-17 DIAGNOSIS — K589 Irritable bowel syndrome without diarrhea: Secondary | ICD-10-CM

## 2015-05-17 DIAGNOSIS — S46812A Strain of other muscles, fascia and tendons at shoulder and upper arm level, left arm, initial encounter: Secondary | ICD-10-CM | POA: Diagnosis not present

## 2015-05-17 MED ORDER — TIZANIDINE HCL 4 MG PO TABS: 2.0000 mg | ORAL_TABLET | Freq: Four times a day (QID) | ORAL | Status: DC | PRN

## 2015-05-17 MED ORDER — PANTOPRAZOLE SODIUM 40 MG PO TBEC: 40.0000 mg | DELAYED_RELEASE_TABLET | Freq: Every day | ORAL | Status: DC

## 2015-05-17 NOTE — Progress Notes (Signed)
   Subjective:    Patient ID: Laurie [name], female    DOB: 04-Jan-1935, 80 y.o.   MRN: [id number]  HPI Hypertension- Pt denies chest pain, SOB, dizziness, or heart palpitations.  Taking meds as directed w/o problems.  Denies medication side effects.   Constipation - On miralax daily but only having BM every 3 days.   Still having abdominal pain -  Says the hyosciamine has helped some the first 2 days but the 3rd day didn't seem to help.   Pain over left neck and toward the shoulder.  Says feels like a "crick".  No triggers. No injury.  No alleviateing factors. Stared about a week ago. No trauma.    About a week ago went to ED about a week ago at [facility] says was having severe reflux with bile.  Stomach felt swollen and felt like it was pressing in her chest.  Burping with foul odor.  Did labs and EKG to rule out heart problems. All her sxs seem to have improved.     Review of Systems     Objective:   Physical Exam  Constitutional: She is oriented to person, place, and time. She appears well-developed and well-nourished.  HENT:  Head: Normocephalic and atraumatic.  Cardiovascular: Normal rate, regular rhythm and normal heart sounds.   Pulmonary/Chest: Effort normal and breath sounds normal.  Musculoskeletal:  Normal cervical flexion, significantly decreased extension. Decreased rotation right and left but symmetric though the extremes cause discomfort. Extension causes discomfort as well. Nontender over the cervical spine. She is tender over the superior edge of the trapezius from the base of the skull towards the shoulder.  Neurological: She is alert and oriented to person, place, and time.  Skin: Skin is warm and dry.  Psychiatric: She has a normal mood and affect. Her behavior is normal.          Assessment & Plan:  HTN - uncontrolled today. Normally at goal. Will follow at next OV.   Constipation - recommend increase MiraLAX to twice a day.  Abdominal  pain/IBS -  I did encourage her to stick with the hyosciamine  for at least a few weeks and keep her follow-up with GI.  Left trapezius strain - recommend trial of stretches and will try low-dose muscle relaxer at bedtime. Also recommend heating pad. If not improving over the next 3 weeks then please let me know.  GERD- not on PPI. . Will send new rx.

## 2015-05-20 ENCOUNTER — Encounter: Payer: Self-pay | Admitting: Family Medicine

## 2015-05-28 ENCOUNTER — Other Ambulatory Visit: Payer: Self-pay | Admitting: Family Medicine

## 2015-06-10 ENCOUNTER — Ambulatory Visit: Payer: Medicare Other | Admitting: Gastroenterology

## 2015-06-14 ENCOUNTER — Ambulatory Visit: Payer: Medicare Other | Admitting: Gastroenterology

## 2015-07-08 ENCOUNTER — Other Ambulatory Visit: Payer: Self-pay | Admitting: Family Medicine

## 2015-07-29 ENCOUNTER — Other Ambulatory Visit: Payer: Self-pay | Admitting: Thoracic Surgery (Cardiothoracic Vascular Surgery)

## 2015-07-29 DIAGNOSIS — I714 Abdominal aortic aneurysm, without rupture, unspecified: Secondary | ICD-10-CM

## 2015-07-31 ENCOUNTER — Ambulatory Visit (INDEPENDENT_AMBULATORY_CARE_PROVIDER_SITE_OTHER): Payer: Medicare Other

## 2015-07-31 ENCOUNTER — Ambulatory Visit (INDEPENDENT_AMBULATORY_CARE_PROVIDER_SITE_OTHER): Payer: Medicare Other | Admitting: Family Medicine

## 2015-07-31 ENCOUNTER — Encounter: Payer: Self-pay | Admitting: Family Medicine

## 2015-07-31 VITALS — BP 139/72 | HR 89 | Temp 98.0°F | Ht 67.0 in | Wt 164.0 lb

## 2015-07-31 DIAGNOSIS — M25511 Pain in right shoulder: Secondary | ICD-10-CM | POA: Diagnosis not present

## 2015-07-31 DIAGNOSIS — T148XXA Other injury of unspecified body region, initial encounter: Secondary | ICD-10-CM

## 2015-07-31 DIAGNOSIS — R229 Localized swelling, mass and lump, unspecified: Secondary | ICD-10-CM

## 2015-07-31 NOTE — Progress Notes (Signed)
   Subjective:    Patient ID: Laurie [name], female    DOB: 1935/07/17, 80 y.o.   MRN: [id number]  HPI Noticed a lump on her left hip about 3 days ago and then noticed bruise about 2 days ago.  Did adopt a dog who has been jumping up on her but not painful. Doesn't remember an actual injury.  No fever, chills, etc.  No other lumps or nodules.  Says it isn't really painful   Using her tramadol at night for her low back and hip pain and right shoulder pain.  Weather change makes it   Review of Systems     Objective:   Physical Exam  Constitutional: She is oriented to person, place, and time. She appears well-developed and well-nourished.  Abdominal:    Neurological: She is alert and oriented to person, place, and time.  Skin: Skin is warm and dry.  5 x 2 cm spindle shaped form nodule under the skin with a bruise on top that is 3 x 8 cm.    Psychiatric: She has a normal mood and affect. Her behavior is normal.          Assessment & Plan:  Skin nodule - it could be a hematoma versus a cyst versus a more solid growth. It has a spindle type shape to it which is a little bit unusual. We'll get her in for ultrasound for further evaluation. She does have a bruise over it so certainly could just be soft tissue trauma with a hematoma. Next  Right hip shoulder and low back pain-recommend that we get her in with our sports medicine physicians. She will check to see if they're covered under her insurance plans. She had difficulty in the past when she saw Dr. Dianah Field for an acute visit for sinus infection and it was not covered under her insurance plan because he was listed is out of network.

## 2015-08-06 ENCOUNTER — Other Ambulatory Visit: Payer: Self-pay | Admitting: Family Medicine

## 2015-08-07 ENCOUNTER — Other Ambulatory Visit: Payer: Self-pay | Admitting: *Deleted

## 2015-08-07 MED ORDER — TRAMADOL HCL 50 MG PO TABS: 50.0000 mg | ORAL_TABLET | Freq: Three times a day (TID) | ORAL | Status: DC | PRN

## 2015-08-09 ENCOUNTER — Other Ambulatory Visit: Payer: Self-pay | Admitting: Family Medicine

## 2015-08-09 ENCOUNTER — Other Ambulatory Visit: Payer: Self-pay | Admitting: *Deleted

## 2015-08-12 ENCOUNTER — Other Ambulatory Visit: Payer: Self-pay | Admitting: Sports Medicine

## 2015-08-13 LAB — CREATININE, SERUM: Creat: 0.79 mg/dL (ref 0.60–0.93)

## 2015-08-15 ENCOUNTER — Other Ambulatory Visit: Payer: Self-pay | Admitting: Thoracic Surgery (Cardiothoracic Vascular Surgery)

## 2015-08-15 ENCOUNTER — Ambulatory Visit (HOSPITAL_BASED_OUTPATIENT_CLINIC_OR_DEPARTMENT_OTHER)
Admission: RE | Admit: 2015-08-15 | Discharge: 2015-08-15 | Disposition: A | Discharge status: Home / Self Care | Payer: Medicare Other | Source: Ambulatory Visit | Attending: Thoracic Surgery (Cardiothoracic Vascular Surgery) | Admitting: Thoracic Surgery (Cardiothoracic Vascular Surgery)

## 2015-08-15 ENCOUNTER — Encounter (HOSPITAL_BASED_OUTPATIENT_CLINIC_OR_DEPARTMENT_OTHER): Payer: Self-pay

## 2015-08-15 DIAGNOSIS — Z952 Presence of prosthetic heart valve: Secondary | ICD-10-CM | POA: Insufficient documentation

## 2015-08-15 DIAGNOSIS — I714 Abdominal aortic aneurysm, without rupture, unspecified: Secondary | ICD-10-CM

## 2015-08-15 DIAGNOSIS — I712 Thoracic aortic aneurysm, without rupture, unspecified: Secondary | ICD-10-CM

## 2015-08-15 MED ORDER — IOHEXOL 350 MG/ML SOLN
100.0000 mL | Freq: Once | INTRAVENOUS | Status: AC | PRN
  Administered 2015-08-15: 100 mL via INTRAVENOUS

## 2015-08-19 ENCOUNTER — Ambulatory Visit: Payer: Medicare Other | Admitting: Thoracic Surgery (Cardiothoracic Vascular Surgery)

## 2015-08-19 ENCOUNTER — Other Ambulatory Visit: Payer: Medicare Other

## 2015-08-19 ENCOUNTER — Ambulatory Visit (INDEPENDENT_AMBULATORY_CARE_PROVIDER_SITE_OTHER): Payer: Medicare Other | Admitting: Thoracic Surgery (Cardiothoracic Vascular Surgery)

## 2015-08-19 VITALS — BP 139/76 | HR 65 | Resp 16 | Ht 67.0 in | Wt 156.0 lb

## 2015-08-19 DIAGNOSIS — I712 Thoracic aortic aneurysm, without rupture, unspecified: Secondary | ICD-10-CM | POA: Insufficient documentation

## 2015-08-19 DIAGNOSIS — J941 Fibrothorax: Secondary | ICD-10-CM | POA: Diagnosis not present

## 2015-08-19 DIAGNOSIS — I714 Abdominal aortic aneurysm, without rupture, unspecified: Secondary | ICD-10-CM

## 2015-08-19 DIAGNOSIS — Z954 Presence of other heart-valve replacement: Secondary | ICD-10-CM

## 2015-08-19 DIAGNOSIS — J929 Pleural plaque without asbestos: Secondary | ICD-10-CM

## 2015-08-19 DIAGNOSIS — Z952 Presence of prosthetic heart valve: Secondary | ICD-10-CM

## 2015-08-19 DIAGNOSIS — I77810 Thoracic aortic ectasia: Secondary | ICD-10-CM

## 2015-08-19 DIAGNOSIS — Z9889 Other specified postprocedural states: Secondary | ICD-10-CM

## 2015-08-19 DIAGNOSIS — Z902 Acquired absence of lung [part of]: Secondary | ICD-10-CM

## 2015-08-19 NOTE — Patient Instructions (Signed)
Continue to monitor your blood pressure closely and seek assistance from your primary care physician and cardiologist in adjustments to your medications as necessary

## 2015-08-19 NOTE — Progress Notes (Signed)
HPI: FU CAD, ho aortic stenosis status post aortic valve replacement in September 2009. She had a pericardial tissue valve. She also had a septal myectomy and a right middle lobectomy due to lung CA. Note, she had a 50% mid LAD by catheterization preoperatively. Echocardiogram in May of 2015 showed normal LV function, bioprosthetic aortic valve with no aortic insufficiency and mild left atrial enlargement. Followup carotid Dopplers in Dec 2015 revealed a 40-59% left and 1-39% right stenosis. FU recommended in one year. Abdominal CT in May 2016 showed a 3.3 cm infrarenal abdominal aortic aneurysm; 1.6 cm right iliac aneurysm. Chest CT 9/16 showed TAA 4.6 cm; 3.4 cm AAA. Since I last saw her, the patient has dyspnea with more extreme activities but not with routine activities. It is relieved with rest. It is not associated with chest pain. There is no orthopnea, PND or pedal edema. There is no syncope or palpitations. There is no exertional chest pain.   Current Outpatient Prescriptions  Medication Sig Dispense Refill  . acetaminophen (TYLENOL) 500 MG tablet Take 500 mg by mouth every 6 (six) hours as needed. Pain      . ALPRAZolam (XANAX) 0.5 MG tablet TAKE 1 TABLET BY MOUTH TWICE A DAY 60 tablet 1  . aspirin 81 MG tablet Take 81 mg by mouth daily.      Marland Kitchen lisinopril (PRINIVIL,ZESTRIL) 40 MG tablet TAKE 1 TABLET (40 MG TOTAL) BY MOUTH DAILY. 90 tablet 3  . metoprolol (LOPRESSOR) 50 MG tablet Take 1.5 tablets (75 mg total) by mouth 2 (two) times daily. 270 tablet 3  . polyethylene glycol powder (GLYCOLAX/MIRALAX) powder Take 1-2 capfuls daily in water (Patient taking differently: Take 1 Container by mouth daily as needed for mild constipation or moderate constipation. ) 255 g 3  . traMADol (ULTRAM) 50 MG tablet Take 1 tablet (50 mg total) by mouth every 8 (eight) hours as needed. for pain 60 tablet 0   No current facility-administered medications for this visit.     Past Medical History    Diagnosis Date  . Anemia     iron deficiency  . Shingles 11-09  . COPD (chronic obstructive pulmonary disease) 2005    on CXR with R apical scaring  . Atrial fibrillation     post op  . Hypertension   . Cerebrovascular disease   . S/P aortic valve replacement 08/28/2008    [facility]  . S/P lobectomy of lung 08/28/2008    rt middle lobe  dr. Roxy Manns  . AAA (abdominal aortic aneurysm)   . CAD (coronary artery disease)   . Hypercholesterolemia     Hx: of  . Headache(784.0)     Hx: of migraines in the past  . Carcinoma of lung 08/28/2008    T2N0 moderately diff. squamous cell CA  . Ascending aorta dilatation     Past Surgical History  Procedure Laterality Date  . Aapy    . Tonsillectomy and adenoidectomy    . U/s guided aspiration of r breast cyst   2009    at the breast clinic  . Rml removed dr Ricard Dillon for lung cancer  08/28/2008    T2N0 squamous cell CA  . Cardiac valve replacement    . Abdominal surgery    . Tonsillectomy    . Aortic valve [facility]  [facility]28[facility]2008    [facility]  . Cardiac valve surgery  08/28/2008    septal myomectomy  . Colonoscopy  Hx: of  . Cataract extraction w/ intraocular lens  implant, bilateral      Hx:of  . Dilation and curettage of uterus    . Cardiac catheterization    . Appendectomy    . Cholecystectomy N/A 12/13/2013    Procedure: LAPAROSCOPIC CHOLECYSTECTOMY;  Surgeon: Ralene Ok, MD;  Location: [facility];  Service: General;  Laterality: N/A;  . Ercp N/A 05/08/2015    Procedure: ENDOSCOPIC RETROGRADE CHOLANGIOPANCREATOGRAPHY (ERCP);  Surgeon: Inda Castle, MD;  Location: [facility];  Service: Endoscopy;  Laterality: N/A;    Social History   Social History  . Marital Status: Single    Spouse Name: N/A  . Number of Children: N/A  . Years of Education: N/A   Occupational History  . Not on file.   Social History Main Topics  . Smoking status: Former Smoker -- 40 years  . Smokeless tobacco:  Never Used  . Alcohol Use: No  . Drug Use: No  . Sexual Activity: Not on file   Other Topics Concern  . Not on file   Social History Narrative    ROS: no fevers or chills, productive cough, hemoptysis, dysphasia, odynophagia, melena, hematochezia, dysuria, hematuria, rash, seizure activity, orthopnea, PND, pedal edema, claudication. Remaining systems are negative.  Physical Exam: Well-developed well-nourished in no acute distress.  Skin is warm and dry.  HEENT is normal.  Neck is supple.  Chest is clear to auscultation with normal expansion.  Cardiovascular exam is regular rate and rhythm. 2/6 systolic murmur; no diastolic murmur Abdominal exam nontender or distended. No masses palpated. Extremities show no edema. neuro grossly intact  ECG sinus rhythm at a rate of 67. Cannot rule out prior septal infarct. Left ventricular hypertrophy with repolarization abnormality.

## 2015-08-19 NOTE — Progress Notes (Signed)
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 She has also been followed for mild aneurysmal dilatation of the ascending thoracic aorta.  She was last s.een here in our office on 08/27/2014.  Over the past year the patient has remained very stable from a cardiac standpoint. She recently underwent ERCP because of concerns of a possible stricture of the pancreatic duct. The patient states that this did not help her at all. She denies any symptoms of shortness of breath either with activity or at rest. She complains of chronic pain in her middle back and right side that seems to be positional. The pain is typically worse at night if she tries to lay on her right side. Her appetite is good. Bowel function is regular. She denies any fevers chills or productive cough. She claims that her blood pressure has been under good control at home   Current Outpatient Prescriptions  Medication Sig Dispense Refill  . acetaminophen (TYLENOL) 500 MG tablet Take 500 mg by mouth every 6 (six) hours as needed. Pain      . ALPRAZolam (XANAX) 0.5 MG tablet TAKE 1 TABLET BY MOUTH TWICE A DAY 60 tablet 1  . aspirin 81 MG tablet Take 81 mg by mouth daily.      Marland Kitchen lisinopril (PRINIVIL,ZESTRIL) 40 MG tablet TAKE 1 TABLET (40 MG TOTAL) BY MOUTH DAILY. 90 tablet 3  . metoprolol (LOPRESSOR) 50 MG tablet Take 1.5 tablets (75 mg total) by mouth 2 (two) times daily. 270 tablet 3  . polyethylene glycol powder (GLYCOLAX/MIRALAX) powder Take 1-2 capfuls daily in water (Patient taking  differently: Take 1 Container by mouth daily as needed for mild constipation or moderate constipation. ) 255 g 3  . traMADol (ULTRAM) 50 MG tablet Take 1 tablet (50 mg total) by mouth every 8 (eight) hours as needed. for pain 60 tablet 0   No current facility-administered medications for this visit.      Physical Exam:   BP 139/76 mmHg  Pulse 65  Resp 16  Ht '5\' 7"'$  (1.702 m)  Wt 156 lb (70.761 kg)  BMI 24.43 kg/m2  SpO2 95%  General:  Well-appearing  Chest:   clear  CV:   Regular rate and rhythm  Incisions:  n/a  Abdomen:  Soft and nontender  Extremities:  Warm and well-perfused  Diagnostic Tests:  CT ANGIOGRAPHY CHEST ABDOMEN AND PELVIS WITH CONTRAST  TECHNIQUE: Multidetector CT imaging of the chest abdomen and pelvis was performed using the standard protocol during bolus administration of intravenous contrast. Multiplanar CT image reconstructions and MIPs were obtained to evaluate the vascular anatomy.  CONTRAST: 110m OMNIPAQUE IOHEXOL 350 MG/ML SOLN  COMPARISON: 04/04/2015. 08/28/2013.  FINDINGS: Chest:  Postoperative changes from aortic valve replacement are stable. Maximal diameter in the ascending aorta at the junction between the native aorta and graft is 4.6 cm. This has slightly increased. My direct measurements from the prior study yields 4.3 cm. Maximal diameters at the sinus of Valsalva and sino-tubular junction are  3.4 cm and 3.4 cm respectively. There is no evidence of intramural hematoma.  Great vessels are patent. There is significant narrowing at the origin of the right vertebral artery. The right subclavian artery and innominate artery remain mildly ectatic. Right axillary artery is mildly ectatic. Maximal diameter of the right axillary artery is 14 mm. Ectasia of the right subclavian artery and innominate artery are less impressive. Left vertebral artery is dominant and patent. Left subclavian and axillary arteries are ectatic. Maximal  left axillary artery diameter is 16 mm.  Atherosclerotic changes with irregular plaque throughout the aortic arch and descending aorta is present. It is not significantly changed. Maximal diameter of the proximal descending aorta is 3.9 cm. Maximal diameter of the distal descending aorta is 3.9 cm.  There is no obvious evidence of acute pulmonary thromboembolism.  The heart is normal in size. There is left ventricular myocardial hypertrophy. Small hiatal hernia is suspected.  No pneumothorax. Postop changes and scarring in the right lung are not significantly changed. Pleural thickening posteriorly in the right hemi thorax is also unchanged.  No acute bony deformity.  Abdomen and pelvis:  Maximal diameter of the abdominal aorta is 3.4 cm below the renal arteries. This has increased from 3.3 cm. Atherosclerotic changes with calcification are noted. There is a penetrating ulcer to the left of the aorta below the renal arteries.  There is severe narrowing at the origin of the celiac axis. Left gastric and splenic arteries are patent.  SMA is patent. The hepatic artery is completely replaced from the SMA. Branch vessels are grossly patent.  IMA is mildly diseased at its origin. Branch vessels are patent.  Single renal arteries are patent bilaterally.  There is 50% narrowing at the origin of the right common iliac artery with post stenotic dilatation. Maximal caliber is 16 mm. It is patent. Right external and internal iliac arteries are patent.  There are atherosclerotic changes throughout the left common iliac artery without significant focal narrowing. Left internal and external iliac arteries are patent.  The left gonadal vein is prominent and refluxes with contrast.  The biliary system is dilated and contains air.  Liver parenchyma is within normal limits.  Postcholecystectomy  Spleen, pancreas, and right adrenal gland are within normal  limits  Tiny hypodensities in the kidneys are nonspecific  Left adrenal nodule is stable dating back to years.  Bladder is unremarkable. Uterus and adnexa are within normal limits.  No vertebral compression deformity. Degenerative disc disease at L4-5 and L5-S1 is present without central stenosis.  Review of the MIP images confirms the above findings.  IMPRESSION: Postoperative changes from aortic valve replacement are again noted. The only change is dilatation of the ascending aorta has increased from 4.3 cm to 4.6 cm. There is no evidence of complication regarding this surgical correction  Significant narrowing at the origin of the right vertebral artery. Carotid Doppler study may be helpful.  Maximal diameter of the infrarenal abdominal aorta has increased from 3.3 cm to 3.4 cm. Recommend followup by ultrasound in 3 years. This recommendation follows ACR consensus guidelines: White Paper of the ACR Incidental Findings Committee II on Vascular Findings. J Am Coll Radiol 2013; 10:789-794  Significant narrowing at the origin of the celiac axis.  50% narrowing at the origin of the right common iliac artery.  Scarring throughout the right lung is not significantly changed.  Pneumobilia is most likely related to previous biliary instrumentation.  Chronic changes as described.   Electronically Signed  By:  Marybelle Killings M.D.  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  She also underwent aortic valve replacement using a bioprosthetic tissue valve at that time, and she has remained clinically very stable from a cardiac standpoint ever since. She has mild aneurysmal enlargement of the ascending thoracic aorta. I personally reviewed the patient's follow-up CT angiogram. There is mild fusiform  dilatation of the ascending aorta. To my exam the maximum transverse diameter is only 4.2 cm and considerably less than that reported by the interpreting radiologist.  I do not appreciate any significant change over the last few CT scans. The patient also has a very small infrarenal abdominal aortic aneurysm that measures 3.3 cm in its greatest transverse diameter. There are no suspicious parenchymal lung opacities in either lung.   Plan:  The patient will return in 1 year for follow-up CT angiogram of the chest. We have not recommended any changes to the patient's current medications at this time.  I spent in excess of 15 minutes during the conduct of this office consultation and >50% of this time involved direct face-to-face encounter with the patient for counseling and/or coordination of their care.    Valentina Gu. Roxy Manns, MD 08/19/2015 12:36 PM

## 2015-08-21 ENCOUNTER — Encounter: Payer: Self-pay | Admitting: Cardiology

## 2015-08-21 ENCOUNTER — Ambulatory Visit (INDEPENDENT_AMBULATORY_CARE_PROVIDER_SITE_OTHER): Payer: Medicare Other | Admitting: Cardiology

## 2015-08-21 VITALS — BP 132/94 | HR 67 | Ht 67.0 in | Wt 165.0 lb

## 2015-08-21 DIAGNOSIS — I714 Abdominal aortic aneurysm, without rupture, unspecified: Secondary | ICD-10-CM

## 2015-08-21 DIAGNOSIS — I77810 Thoracic aortic ectasia: Secondary | ICD-10-CM

## 2015-08-21 DIAGNOSIS — I10 Essential (primary) hypertension: Secondary | ICD-10-CM

## 2015-08-21 DIAGNOSIS — I712 Thoracic aortic aneurysm, without rupture: Secondary | ICD-10-CM | POA: Diagnosis not present

## 2015-08-21 DIAGNOSIS — I723 Aneurysm of iliac artery: Secondary | ICD-10-CM

## 2015-08-21 DIAGNOSIS — I2510 Atherosclerotic heart disease of native coronary artery without angina pectoris: Secondary | ICD-10-CM

## 2015-08-21 MED ORDER — METOPROLOL TARTRATE 50 MG PO TABS: 75.0000 mg | ORAL_TABLET | Freq: Two times a day (BID) | ORAL | Status: DC

## 2015-08-21 NOTE — Patient Instructions (Signed)
Your physician wants you to follow-up in: ONE YEAR WITH DR CRENSHAW You will receive a reminder letter in the mail two months in advance. If you don't receive a letter, please call our office to schedule the follow-up appointment.  

## 2015-08-21 NOTE — Assessment & Plan Note (Signed)
Follow-up CTA September 2017.

## 2015-08-21 NOTE — Assessment & Plan Note (Signed)
Continue diet. Intolerant to statins. 

## 2015-08-21 NOTE — Assessment & Plan Note (Signed)
Continue aspirin and statin. 

## 2015-08-21 NOTE — Assessment & Plan Note (Signed)
Follow-up carotid Dopplers December 2016.

## 2015-08-21 NOTE — Assessment & Plan Note (Signed)
Follow-up CTA May 2017.

## 2015-08-21 NOTE — Assessment & Plan Note (Addendum)
Diastolic pressure mildly elevated but typically controlled. Continue present medications and follow.

## 2015-08-21 NOTE — Assessment & Plan Note (Signed)
Continue SBE prophylaxis. 

## 2015-09-05 ENCOUNTER — Other Ambulatory Visit: Payer: Self-pay | Admitting: Family Medicine

## 2015-09-24 ENCOUNTER — Other Ambulatory Visit: Payer: Self-pay | Admitting: *Deleted

## 2015-09-24 MED ORDER — TRAMADOL HCL 50 MG PO TABS: 50.0000 mg | ORAL_TABLET | Freq: Three times a day (TID) | ORAL | Status: DC | PRN

## 2015-09-24 NOTE — Telephone Encounter (Signed)
Called and lvm informing pt that she will need to call and schedule a f/u appt for BP, Dr. Madilyn Fireman wanted her to f/u on this back in September. Pt advised to contact our office to schedule.Laurie [name]'

## 2015-09-30 ENCOUNTER — Telehealth: Payer: Self-pay | Admitting: Cardiology

## 2015-09-30 NOTE — Telephone Encounter (Signed)
Left message for pt to call.

## 2015-09-30 NOTE — Telephone Encounter (Signed)
Spoke with pt, she has a place on her neck near the carotid artery. She says it feels tight and she is not having pain but can not describe very well. Explained her insurance will not pay for anything prior to her yearly follow up for dx unrelated to cartid artery disease. Patient voiced understanding

## 2015-09-30 NOTE — Telephone Encounter (Signed)
Pt is returning a nurse's call in regards to Carotid  Thanks

## 2015-09-30 NOTE — Telephone Encounter (Signed)
Called, goes straight to VM, LMTCB. This probably regarding a recall for yearly carotids - will route to scheduling.

## 2015-09-30 NOTE — Telephone Encounter (Signed)
Patient wants to speak with Hilda Blades about having her "neck" test done in [place].

## 2015-11-13 ENCOUNTER — Other Ambulatory Visit: Payer: Self-pay | Admitting: Sports Medicine

## 2015-11-19 ENCOUNTER — Telehealth: Payer: Self-pay | Admitting: Cardiology

## 2015-11-19 ENCOUNTER — Encounter: Payer: Self-pay | Admitting: Family Medicine

## 2015-11-19 ENCOUNTER — Ambulatory Visit (INDEPENDENT_AMBULATORY_CARE_PROVIDER_SITE_OTHER): Payer: Medicare Other | Admitting: Family Medicine

## 2015-11-19 VITALS — BP 154/86 | HR 75 | Temp 97.9°F | Wt 161.0 lb

## 2015-11-19 DIAGNOSIS — N898 Other specified noninflammatory disorders of vagina: Secondary | ICD-10-CM

## 2015-11-19 DIAGNOSIS — R1084 Generalized abdominal pain: Secondary | ICD-10-CM

## 2015-11-19 DIAGNOSIS — R208 Other disturbances of skin sensation: Secondary | ICD-10-CM | POA: Diagnosis not present

## 2015-11-19 DIAGNOSIS — R140 Abdominal distension (gaseous): Secondary | ICD-10-CM

## 2015-11-19 DIAGNOSIS — R209 Unspecified disturbances of skin sensation: Secondary | ICD-10-CM

## 2015-11-19 DIAGNOSIS — I779 Disorder of arteries and arterioles, unspecified: Secondary | ICD-10-CM

## 2015-11-19 DIAGNOSIS — I739 Peripheral vascular disease, unspecified: Principal | ICD-10-CM

## 2015-11-19 MED ORDER — TRAMADOL HCL 50 MG PO TABS: 50.0000 mg | ORAL_TABLET | Freq: Three times a day (TID) | ORAL | Status: DC | PRN

## 2015-11-19 MED ORDER — DICYCLOMINE HCL 20 MG PO TABS: 10.0000 mg | ORAL_TABLET | Freq: Three times a day (TID) | ORAL | Status: DC | PRN

## 2015-11-19 MED ORDER — AMBULATORY NON FORMULARY MEDICATION: Status: DC

## 2015-11-19 NOTE — Patient Instructions (Signed)
Recommend a trial of a probiotic. These are over-the-counter and typically found in the vitamin section.

## 2015-11-19 NOTE — Telephone Encounter (Signed)
Left message for pt to call.

## 2015-11-19 NOTE — Progress Notes (Signed)
Subjective:    Patient ID: Laurie [name], female    DOB: Oct 19, 1935, 80 y.o.   MRN: [id number]  HPI C/O of bilaterat foot pain. Says they bother her most days. She is not sure who to talk to about this.  Says they feel cold and numb all over the feet.    Says having some abdominal pain that  pain wakes her up at 4-5 AM.  Has been going on for a couple of months but worse the last week.  Says standing makes her feel better.  Sitting makes it worse.  Wearing tight pants is very bothersome.  Has been using her tramadol.  Has been very bloated and gassy. She has had this on and off and has been seen by GI. In fact the had a ERCP at the end of the summer.  No N/V/D.    She has also noticed a sticky clear vaginal d/c which is different than her usual.  No pain or blood.    Review of Systems     BP 154/86 mmHg  Pulse 75  Temp(Src) 97.9 F (36.6 C) (Oral)  Wt 161 lb (73.029 kg)  SpO2 94%    Allergies  Allergen Reactions  . Hyoscyamine Other (See Comments)    Mental status changes.   . Statins     Muscle aches  . Rofecoxib Rash    Past Medical History  Diagnosis Date  . Anemia     iron deficiency  . Shingles 11-09  . COPD (chronic obstructive pulmonary disease) ([name]) 2005    on CXR with R apical scaring  . Atrial fibrillation ([place])     post op  . Hypertension   . Cerebrovascular disease   . S/P aortic valve replacement 08/28/2008    [facility]  . S/P lobectomy of lung 08/28/2008    rt middle lobe  dr. Roxy Manns  . AAA (abdominal aortic aneurysm) ([place])   . CAD (coronary artery disease)   . Hypercholesterolemia     Hx: of  . Headache(784.0)     Hx: of migraines in the past  . Carcinoma of lung ([place]) 08/28/2008    T2N0 moderately diff. squamous cell CA  . Ascending aorta dilatation [facility])     Past Surgical History  Procedure Laterality Date  . Aapy    . Tonsillectomy and adenoidectomy    . U/s guided aspiration of r breast cyst   2009    at the breast clinic  . Rml  removed dr Ricard Dillon for lung cancer  08/28/2008    T2N0 squamous cell CA  . Cardiac valve replacement    . Abdominal surgery    . Tonsillectomy    . Aortic valve replacement  08/28/2008    [facility]  . Cardiac valve surgery  08/28/2008    septal myomectomy  . Colonoscopy      Hx: of  . Cataract extraction w/ intraocular lens  implant, bilateral      Hx:of  . Dilation and curettage of uterus    . Cardiac catheterization    . Appendectomy    . Cholecystectomy N/A 12/13/2013    Procedure: LAPAROSCOPIC CHOLECYSTECTOMY;  Surgeon: ARalene Ok MD;  [place]  Service: General;  Laterality: N/A;  . Ercp N/A 05/08/2015    Procedure: ENDOSCOPIC RETROGRADE CHOLANGIOPANCREATOGRAPHY (ERCP);  Surgeon: RInda Castle MD;  [place]  Service: Endoscopy;  Laterality: N/A;    Social History   Social History  .  Marital Status: Single    Spouse Name: N/A  . Number of Children: N/A  . Years of Education: N/A   Occupational History  . Not on file.   Social History Main Topics  . Smoking status: Former Smoker -- 40 years  . Smokeless tobacco: Never Used  . Alcohol Use: No  . Drug Use: No  . Sexual Activity: Not on file   Other Topics Concern  . Not on file   Social History Narrative    Family History  Problem Relation Age of Onset  . Stroke Father   . Diabetes Son     brittle diabetes    Outpatient Encounter Prescriptions as of 11/19/2015  Medication Sig  . acetaminophen (TYLENOL) 500 MG tablet Take 500 mg by mouth every 6 (six) hours as needed. Pain    . ALPRAZolam (XANAX) 0.5 MG tablet TAKE 1 TABLET BY MOUTH TWICE A DAY  . aspirin 81 MG tablet Take 81 mg by mouth daily.    Marland Kitchen lisinopril (PRINIVIL,ZESTRIL) 40 MG tablet TAKE 1 TABLET (40 MG TOTAL) BY MOUTH DAILY.  . metoprolol (LOPRESSOR) 50 MG tablet Take 1.5 tablets (75 mg total) by mouth 2 (two) times daily.  . polyethylene glycol powder (GLYCOLAX/MIRALAX) powder  Take 1-2 capfuls daily in water (Patient taking differently: Take 1 Container by mouth daily as needed for mild constipation or moderate constipation. )  . traMADol (ULTRAM) 50 MG tablet Take 1 tablet (50 mg total) by mouth every 8 (eight) hours as needed.  . [DISCONTINUED] traMADol (ULTRAM) 50 MG tablet Take 1 tablet (50 mg total) by mouth every 8 (eight) hours as needed.  . AMBULATORY NON FORMULARY MEDICATION Medication Name: ABI of both lower extremities. Please schedule at [facility].  . dicyclomine (BENTYL) 20 MG tablet Take 0.5-1 tablets (10-20 mg total) by mouth every 8 (eight) hours as needed for spasms.   No facility-administered encounter medications on file as of 11/19/2015.       Objective:   Physical Exam  Constitutional: She is oriented to person, place, and time. She appears well-developed and well-nourished.  HENT:  Head: Normocephalic and atraumatic.  Cardiovascular: Normal rate, regular rhythm and normal heart sounds.   Pulmonary/Chest: Effort normal and breath sounds normal.  Abdominal: Soft. Bowel sounds are normal. She exhibits no distension and no mass. There is tenderness. There is no rebound and no guarding.  Diffuse tenderness.   Musculoskeletal:  Overall her feet look well taking care of. No cracking of the skin or open wounds or rashes. Unable to palpate dorsal pedal pulses bilaterally. She does have good capillary refill on all of her toes. No swelling around the ankles are tops of the feet.  Neurological: She is alert and oriented to person, place, and time.  Skin: Skin is warm and dry.  Psychiatric: She has a normal mood and affect. Her behavior is normal.          Assessment & Plan:  Bilateral foot pain/cold feet/numbness - will order ABIs to evaluate blood flow. I was unable to palpate dorsal pedal pulses. With her known coronary artery disease is highly likely that she may have some peripheral vascular disease. If the ABIs are normal then consider  further workup for neuropathy.  Bilateral carotid artery stenosis - due for rpeat dopplers. Call Dr. Lonia Skinner office to get this scheduled.   Abdominal pain/bloating-recommend a trial of a probiotic. In addition we will try dicyclomine since she did not respond well to the hyoscyamine. Call some  mental status changes. If not improving then we will try to get her back in with GI. I do not see anything acute or worrisome on exam.  Vaginitis - will call with KOH results.

## 2015-11-19 NOTE — Telephone Encounter (Signed)
New Message  Pt called to speak w/ RN about possible carotid ultrasound- no order in the syst for Carotid. Pt also stated she would like to have it done in [place]. Please call back and discuss.

## 2015-11-20 LAB — KOH PREP: RESULT - KOH: NONE SEEN

## 2015-11-22 NOTE — Telephone Encounter (Signed)
Returning your call. °

## 2015-11-22 NOTE — Telephone Encounter (Signed)
Left message for pt, they do not do medicare scans in [place]. She can [facility] the [facility] [place]. Order placed and the pt can call [phone] to schedule. She will call with any questions.

## 2015-11-27 ENCOUNTER — Ambulatory Visit (HOSPITAL_BASED_OUTPATIENT_CLINIC_OR_DEPARTMENT_OTHER)
Admission: RE | Admit: 2015-11-27 | Discharge: 2015-11-27 | Disposition: A | Discharge status: Home / Self Care | Payer: Medicare Other | Source: Ambulatory Visit | Attending: Cardiology | Admitting: Cardiology

## 2015-11-27 DIAGNOSIS — I779 Disorder of arteries and arterioles, unspecified: Secondary | ICD-10-CM | POA: Insufficient documentation

## 2015-11-27 DIAGNOSIS — I739 Peripheral vascular disease, unspecified: Secondary | ICD-10-CM

## 2015-12-07 ENCOUNTER — Other Ambulatory Visit: Payer: Self-pay | Admitting: Family Medicine

## 2016-01-09 DIAGNOSIS — H9221 Otorrhagia, right ear: Secondary | ICD-10-CM | POA: Diagnosis not present

## 2016-01-10 ENCOUNTER — Telehealth: Payer: Self-pay | Admitting: Family Medicine

## 2016-01-10 ENCOUNTER — Other Ambulatory Visit: Payer: Self-pay | Admitting: *Deleted

## 2016-01-10 MED ORDER — AMOXICILLIN 500 MG PO CAPS: 2000.0000 mg | ORAL_CAPSULE | Freq: Once | ORAL | Status: DC

## 2016-01-10 NOTE — Telephone Encounter (Signed)
Abx sent to pharmacy. lvm informing pt of the rx being sent.Laurie [name]

## 2016-01-10 NOTE — Telephone Encounter (Signed)
Pt needs and Rx antibiotic sent to local pharmacy due to upcoming dentist appt. Will route to PCP.

## 2016-01-12 ENCOUNTER — Encounter (HOSPITAL_COMMUNITY): Payer: Self-pay | Admitting: Internal Medicine

## 2016-01-12 ENCOUNTER — Inpatient Hospital Stay (HOSPITAL_COMMUNITY)
Admission: AD | Admit: 2016-01-12 | Discharge: 2016-01-16 | DRG: 445 | Disposition: A | Discharge status: Home Health | Payer: Commercial Managed Care - HMO | Source: Other Acute Inpatient Hospital | Attending: Internal Medicine | Admitting: Internal Medicine

## 2016-01-12 DIAGNOSIS — E875 Hyperkalemia: Secondary | ICD-10-CM | POA: Diagnosis present

## 2016-01-12 DIAGNOSIS — Z833 Family history of diabetes mellitus: Secondary | ICD-10-CM

## 2016-01-12 DIAGNOSIS — K8050 Calculus of bile duct without cholangitis or cholecystitis without obstruction: Secondary | ICD-10-CM | POA: Diagnosis present

## 2016-01-12 DIAGNOSIS — R740 Nonspecific elevation of levels of transaminase and lactic acid dehydrogenase [LDH]: Secondary | ICD-10-CM | POA: Diagnosis present

## 2016-01-12 DIAGNOSIS — K838 Other specified diseases of biliary tract: Secondary | ICD-10-CM | POA: Diagnosis not present

## 2016-01-12 DIAGNOSIS — B370 Candidal stomatitis: Secondary | ICD-10-CM | POA: Diagnosis present

## 2016-01-12 DIAGNOSIS — Z87891 Personal history of nicotine dependence: Secondary | ICD-10-CM

## 2016-01-12 DIAGNOSIS — R7989 Other specified abnormal findings of blood chemistry: Secondary | ICD-10-CM | POA: Diagnosis present

## 2016-01-12 DIAGNOSIS — B9689 Other specified bacterial agents as the cause of diseases classified elsewhere: Secondary | ICD-10-CM | POA: Diagnosis present

## 2016-01-12 DIAGNOSIS — I719 Aortic aneurysm of unspecified site, without rupture: Secondary | ICD-10-CM | POA: Diagnosis present

## 2016-01-12 DIAGNOSIS — N179 Acute kidney failure, unspecified: Secondary | ICD-10-CM | POA: Diagnosis present

## 2016-01-12 DIAGNOSIS — R7401 Elevation of levels of liver transaminase levels: Secondary | ICD-10-CM | POA: Diagnosis present

## 2016-01-12 DIAGNOSIS — K8030 Calculus of bile duct with cholangitis, unspecified, without obstruction: Principal | ICD-10-CM | POA: Diagnosis present

## 2016-01-12 DIAGNOSIS — Z7982 Long term (current) use of aspirin: Secondary | ICD-10-CM | POA: Diagnosis not present

## 2016-01-12 DIAGNOSIS — R1010 Upper abdominal pain, unspecified: Secondary | ICD-10-CM

## 2016-01-12 DIAGNOSIS — I10 Essential (primary) hypertension: Secondary | ICD-10-CM | POA: Diagnosis present

## 2016-01-12 DIAGNOSIS — Z952 Presence of prosthetic heart valve: Secondary | ICD-10-CM | POA: Diagnosis not present

## 2016-01-12 DIAGNOSIS — Z8673 Personal history of transient ischemic attack (TIA), and cerebral infarction without residual deficits: Secondary | ICD-10-CM | POA: Diagnosis not present

## 2016-01-12 DIAGNOSIS — I723 Aneurysm of iliac artery: Secondary | ICD-10-CM | POA: Diagnosis not present

## 2016-01-12 DIAGNOSIS — Z9841 Cataract extraction status, right eye: Secondary | ICD-10-CM | POA: Diagnosis not present

## 2016-01-12 DIAGNOSIS — Z961 Presence of intraocular lens: Secondary | ICD-10-CM | POA: Diagnosis present

## 2016-01-12 DIAGNOSIS — I2510 Atherosclerotic heart disease of native coronary artery without angina pectoris: Secondary | ICD-10-CM | POA: Diagnosis not present

## 2016-01-12 DIAGNOSIS — R1011 Right upper quadrant pain: Secondary | ICD-10-CM | POA: Diagnosis not present

## 2016-01-12 DIAGNOSIS — R109 Unspecified abdominal pain: Secondary | ICD-10-CM | POA: Diagnosis not present

## 2016-01-12 DIAGNOSIS — R7881 Bacteremia: Secondary | ICD-10-CM | POA: Insufficient documentation

## 2016-01-12 DIAGNOSIS — Z85118 Personal history of other malignant neoplasm of bronchus and lung: Secondary | ICD-10-CM | POA: Diagnosis not present

## 2016-01-12 DIAGNOSIS — Z9049 Acquired absence of other specified parts of digestive tract: Secondary | ICD-10-CM | POA: Diagnosis not present

## 2016-01-12 DIAGNOSIS — Z888 Allergy status to other drugs, medicaments and biological substances status: Secondary | ICD-10-CM | POA: Diagnosis not present

## 2016-01-12 DIAGNOSIS — K8309 Other cholangitis: Secondary | ICD-10-CM

## 2016-01-12 DIAGNOSIS — Z79899 Other long term (current) drug therapy: Secondary | ICD-10-CM | POA: Diagnosis not present

## 2016-01-12 DIAGNOSIS — R17 Unspecified jaundice: Secondary | ICD-10-CM | POA: Insufficient documentation

## 2016-01-12 DIAGNOSIS — Z9842 Cataract extraction status, left eye: Secondary | ICD-10-CM

## 2016-01-12 DIAGNOSIS — I4891 Unspecified atrial fibrillation: Secondary | ICD-10-CM | POA: Diagnosis present

## 2016-01-12 DIAGNOSIS — Z886 Allergy status to analgesic agent status: Secondary | ICD-10-CM

## 2016-01-12 DIAGNOSIS — J449 Chronic obstructive pulmonary disease, unspecified: Secondary | ICD-10-CM | POA: Diagnosis present

## 2016-01-12 DIAGNOSIS — D72829 Elevated white blood cell count, unspecified: Secondary | ICD-10-CM | POA: Diagnosis not present

## 2016-01-12 DIAGNOSIS — Z902 Acquired absence of lung [part of]: Secondary | ICD-10-CM | POA: Diagnosis not present

## 2016-01-12 DIAGNOSIS — I714 Abdominal aortic aneurysm, without rupture, unspecified: Secondary | ICD-10-CM | POA: Diagnosis present

## 2016-01-12 DIAGNOSIS — R9431 Abnormal electrocardiogram [ECG] [EKG]: Secondary | ICD-10-CM | POA: Diagnosis not present

## 2016-01-12 DIAGNOSIS — I359 Nonrheumatic aortic valve disorder, unspecified: Secondary | ICD-10-CM | POA: Insufficient documentation

## 2016-01-12 DIAGNOSIS — Z823 Family history of stroke: Secondary | ICD-10-CM | POA: Diagnosis not present

## 2016-01-12 DIAGNOSIS — K830 Cholangitis: Secondary | ICD-10-CM | POA: Diagnosis not present

## 2016-01-12 DIAGNOSIS — K831 Obstruction of bile duct: Secondary | ICD-10-CM | POA: Diagnosis not present

## 2016-01-12 DIAGNOSIS — G8929 Other chronic pain: Secondary | ICD-10-CM

## 2016-01-12 DIAGNOSIS — Z6825 Body mass index (BMI) 25.0-25.9, adult: Secondary | ICD-10-CM

## 2016-01-12 DIAGNOSIS — R945 Abnormal results of liver function studies: Secondary | ICD-10-CM | POA: Diagnosis not present

## 2016-01-12 DIAGNOSIS — E7800 Pure hypercholesterolemia, unspecified: Secondary | ICD-10-CM | POA: Diagnosis present

## 2016-01-12 DIAGNOSIS — Z859 Personal history of malignant neoplasm, unspecified: Secondary | ICD-10-CM | POA: Diagnosis not present

## 2016-01-12 DIAGNOSIS — K5790 Diverticulosis of intestine, part unspecified, without perforation or abscess without bleeding: Secondary | ICD-10-CM | POA: Diagnosis not present

## 2016-01-12 DIAGNOSIS — I679 Cerebrovascular disease, unspecified: Secondary | ICD-10-CM | POA: Diagnosis present

## 2016-01-12 DIAGNOSIS — Z8679 Personal history of other diseases of the circulatory system: Secondary | ICD-10-CM | POA: Diagnosis not present

## 2016-01-12 DIAGNOSIS — J439 Emphysema, unspecified: Secondary | ICD-10-CM | POA: Insufficient documentation

## 2016-01-12 LAB — BASIC METABOLIC PANEL
ANION GAP: 12 (ref 5–15)
BUN: 13 mg/dL (ref 6–20)
CALCIUM: 8.8 mg/dL — AB (ref 8.9–10.3)
CO2: 24 mmol/L (ref 22–32)
Chloride: 99 mmol/L — ABNORMAL LOW (ref 101–111)
Creatinine, Ser: 0.69 mg/dL (ref 0.44–1.00)
GFR calc Af Amer: >60 mL/min (ref >60)
GFR calc non Af Amer: >60 mL/min (ref >60)
GLUCOSE: 150 mg/dL — AB (ref 65–99)
Potassium: 4 mmol/L (ref 3.5–5.1)
Sodium: 135 mmol/L (ref 135–145)

## 2016-01-12 LAB — PROTIME-INR
INR: 1.11 (ref 0.00–1.49)
Prothrombin Time: 14.5 seconds (ref 11.6–15.2)

## 2016-01-12 LAB — APTT: aPTT: 31 seconds (ref 24–37)

## 2016-01-12 MED ORDER — HYDRALAZINE HCL 20 MG/ML IJ SOLN: 10.0000 mg | Freq: Four times a day (QID) | INTRAMUSCULAR | Status: DC | PRN

## 2016-01-12 MED ORDER — ONDANSETRON HCL 4 MG PO TABS: 4.0000 mg | ORAL_TABLET | Freq: Four times a day (QID) | ORAL | Status: DC | PRN

## 2016-01-12 MED ORDER — SODIUM CHLORIDE 0.9 % IV SOLN
INTRAVENOUS | Status: AC
Start: 2016-01-12 — End: 2016-01-13
  Administered 2016-01-12 – 2016-01-13 (×3): via INTRAVENOUS

## 2016-01-12 MED ORDER — ONDANSETRON HCL 4 MG/2ML IJ SOLN
4.0000 mg | Freq: Four times a day (QID) | INTRAMUSCULAR | Status: DC | PRN
  Administered 2016-01-12 – 2016-01-13 (×3): 4 mg via INTRAVENOUS
  Filled 2016-01-12 (×3): qty 2

## 2016-01-12 MED ORDER — SODIUM CHLORIDE 0.9% FLUSH
3.0000 mL | Freq: Two times a day (BID) | INTRAVENOUS | Status: DC
  Administered 2016-01-14 – 2016-01-15 (×3): 3 mL via INTRAVENOUS

## 2016-01-12 MED ORDER — ALBUTEROL SULFATE (2.5 MG/3ML) 0.083% IN NEBU: 2.5000 mg | INHALATION_SOLUTION | RESPIRATORY_TRACT | Status: DC | PRN

## 2016-01-12 MED ORDER — METOPROLOL TARTRATE 50 MG PO TABS
50.0000 mg | ORAL_TABLET | Freq: Two times a day (BID) | ORAL | Status: DC
  Administered 2016-01-13 – 2016-01-16 (×7): 50 mg via ORAL
  Filled 2016-01-12 (×7): qty 1

## 2016-01-12 MED ORDER — ACETAMINOPHEN 325 MG PO TABS: 650.0000 mg | ORAL_TABLET | Freq: Four times a day (QID) | ORAL | Status: DC | PRN

## 2016-01-12 MED ORDER — PIPERACILLIN-TAZOBACTAM 3.375 G IVPB 30 MIN
3.3750 g | INTRAVENOUS | Status: AC
  Administered 2016-01-12: 3.375 g via INTRAVENOUS
  Filled 2016-01-12: qty 50

## 2016-01-12 MED ORDER — ACETAMINOPHEN 650 MG RE SUPP
650.0000 mg | Freq: Four times a day (QID) | RECTAL | Status: DC | PRN
  Filled 2016-01-12: qty 1

## 2016-01-12 MED ORDER — HYDROMORPHONE HCL 1 MG/ML IJ SOLN
0.5000 mg | INTRAMUSCULAR | Status: DC | PRN
  Administered 2016-01-12 – 2016-01-15 (×9): 1 mg via INTRAVENOUS
  Filled 2016-01-12 (×9): qty 1

## 2016-01-12 MED ORDER — PIPERACILLIN-TAZOBACTAM 3.375 G IVPB
3.3750 g | Freq: Three times a day (TID) | INTRAVENOUS | Status: DC
  Administered 2016-01-12 – 2016-01-15 (×8): 3.375 g via INTRAVENOUS
  Filled 2016-01-12 (×10): qty 50

## 2016-01-12 NOTE — Progress Notes (Addendum)
Transfer from [facility] 81 year old female status post cholecystectomy with the right upper quadrant pain found to have elevated liver enzymes AST/ALT 517 and 219 respectively. CT scan revealing dilated biliary ducts. Transferring for need of consultative services of gastroenterology. Initial lab work showing hyperkalemia of 6.2. Treated with sodium bicarbonate, insulin, and IV fluids. Vital signs otherwise stable. Consult GI upon arrival. Initially accepted to be admitted  to telemetry bed usually at [facility], but no beds available therefore called GI on call at [facility] has beds were available.

## 2016-01-12 NOTE — H&P (Signed)
[facility] History and Physical  Laurie [name] DZH:[id number] DOB: Nov 06, 1935 DOA: 01/12/2016   PCP: Beatrice Lecher, MD  Specialists: Patient has been seen by Dr. Deatra Ina with gastroenterology. Dr. Stanford Breed is her cardiologist  Chief Complaint: Abdominal pain since last night  HPI: Laurie [name] is a 81 y.o. female with a past medical history of hypertension, abdominal aortic aneurysm, aortic valve replacement who is status post cholecystectomy in 2015 presented to the [facility] with complaints of abdominal pain. Patient was in her usual state of health till yesterday evening when she started developing this pain. The pain was located in the upper abdomen, more towards the right. It was initially a sharp pain. It would come and go. The intensity got worse and became 10/10. Radiated to the back. And then the pain spread diffusely all over her abdomen. No history of any precipitating aggravating or relieving factors. This was associated with nausea and she had an episode of bilious vomiting. Denies any blood in the emesis. She thinks she may have had a fever but did not check her temperature. Did have some chills. Most of the history was provided by the patient's son who is at the bedside. Patient appeared to be in significant discomfort. History is somewhat limited.  She was evaluated in the [facility] Emergency department. LFTs were noted to be significantly high. CT scan was done which showed biliary ductal palpitation with concern for possible stones in the common bile duct. Patient was subsequently transferred over to [facility] for evaluation by gastroenterology.  Home Medications: Prior to Admission medications   Medication Sig Start Date End Date Taking? Authorizing Provider  acetaminophen (TYLENOL) 500 MG tablet Take 500 mg by mouth every 6 (six) hours as needed for moderate pain, fever or headache. Pain    Yes Historical  Provider, MD  ALPRAZolam (XANAX) 0.5 MG tablet TAKE 1 TABLET BY MOUTH TWICE A DAY Patient taking differently: TAKE .25-0.5 TABLET BY MOUTH TWICE A DAY as needed for anxiety 11/13/15  Yes Hali Marry, MD  AMBULATORY NON FORMULARY MEDICATION Medication Name: ABI of both lower extremities. Please schedule at [facility]. 11/19/15  Yes Hali Marry, MD  amoxicillin (AMOXIL) 500 MG capsule Take 4 capsules (2,000 mg total) by mouth once. Take 30 to 60 mins prior to procedure 01/10/16  Yes Hali Marry, MD  aspirin 81 MG tablet Take 81 mg by mouth daily.     Yes Historical Provider, MD  dicyclomine (BENTYL) 20 MG tablet Take 0.5-1 tablets (10-20 mg total) by mouth every 8 (eight) hours as needed for spasms. 11/19/15  Yes Hali Marry, MD  lisinopril (PRINIVIL,ZESTRIL) 40 MG tablet TAKE 1 TABLET (40 MG TOTAL) BY MOUTH DAILY. 08/09/15  Yes Hali Marry, MD  metoprolol (LOPRESSOR) 50 MG tablet Take 1.5 tablets (75 mg total) by mouth 2 (two) times daily. 08/21/15  Yes Lelon Perla, MD  ofloxacin (FLOXIN) 0.3 % otic solution Place 5 drops into both ears 2 (two) times daily.   Yes Historical Provider, MD  pantoprazole (PROTONIX) 40 MG tablet TAKE 1 TABLET (40 MG TOTAL) BY MOUTH DAILY. Patient taking differently: TAKE 1 TABLET (40 MG TOTAL) BY MOUTH DAILY as needed for heartburn/acid reflux 12/09/15  Yes Hali Marry, MD  polyethylene glycol powder (GLYCOLAX/MIRALAX) powder Take 1-2 capfuls daily in water Patient taking differently: Take 1 Container by mouth daily as needed for mild constipation or moderate constipation.  04/25/15  Yes Lori P Hvozdovic,  PA-C  traMADol (ULTRAM) 50 MG tablet Take 1 tablet (50 mg total) by mouth every 8 (eight) hours as needed. Patient taking differently: Take 50 mg by mouth every 8 (eight) hours as needed for moderate pain.  11/19/15  Yes Hali Marry, MD    Allergies:  Allergies  Allergen Reactions  . Hyoscyamine Other  (See Comments)    Mental status changes.   . Statins     Muscle aches  . Rofecoxib Rash    Past Medical History: Past Medical History  Diagnosis Date  . Anemia     iron deficiency  . Shingles 11-09  . COPD (chronic obstructive pulmonary disease) ([place]) 2005    on CXR with R apical scaring  . Atrial fibrillation ([place])     post op  . Hypertension   . Cerebrovascular disease   . S/P aortic valve replacement 08/28/2008    [facility]  . S/P lobectomy of lung 08/28/2008    rt middle lobe  dr. Roxy Manns  . AAA (abdominal aortic aneurysm) ([place])   . CAD (coronary artery disease)   . Hypercholesterolemia     Hx: of  . Headache(784.0)     Hx: of migraines in the past  . Carcinoma of lung ([place]) 08/28/2008    T2N0 moderately diff. squamous cell CA  . Ascending aorta dilatation [facility])     Past Surgical History  Procedure Laterality Date  . Aapy    . Tonsillectomy and adenoidectomy    . U/s guided aspiration of r breast cyst   2009    at the breast clinic  . Rml removed dr Ricard Dillon for lung cancer  08/28/2008    T2N0 squamous cell CA  . Cardiac valve replacement    . Abdominal surgery    . Tonsillectomy    . Aortic valve replacement  08/28/2008    [facility]  . Cardiac valve surgery  08/28/2008    septal myomectomy  . Colonoscopy      Hx: of  . Cataract extraction w/ intraocular lens  implant, bilateral      Hx:of  . Dilation and curettage of uterus    . Cardiac catheterization    . Appendectomy    . Cholecystectomy N/A 12/13/2013    Procedure: LAPAROSCOPIC CHOLECYSTECTOMY;  Surgeon: ARalene Ok MD;  [place]  Service: General;  Laterality: N/A;  . Ercp N/A 05/08/2015    Procedure: ENDOSCOPIC RETROGRADE CHOLANGIOPANCREATOGRAPHY (ERCP);  Surgeon: RInda Castle MD;  [place]  Service: Endoscopy;  Laterality: N/A;    [place] History[place] 7 years ago. No alcohol use.  Independent with daily activities. Sometimes uses a cane to ambulate.  Family History:  Family History  Problem Relation Age of Onset  . Stroke Father   . Diabetes Son     brittle diabetes     Review of Systems - Unable to due to significant pain/acute illness  Physical Examination  Filed Vitals:   01/12/16 1410 01/12/16 1529  BP: 129/59 132/55  Pulse: 91 93  Temp: 98.8 F (37.1 C) 98.8 F (37.1 C)  TempSrc: Oral Oral  Resp: 20 20  Height:  '5\' 7"'$  (1.702 m)  Weight:  73.2 kg (161 lb 6 oz)  SpO2: 95% 94%    BP 132/55 mmHg  Pulse 93  Temp(Src) 98.8 F (37.1 C) (Oral)  Resp 20  Ht '5\' 7"'$  (1.702 m)  Wt 73.2 kg (161 lb  6 oz)  BMI 25.27 kg/m2  SpO2 94%  General appearance: alert, cooperative, appears stated age, distracted and no distress Head: Normocephalic, without obvious abnormality, atraumatic Eyes: conjunctivae/corneas clear. PERRL, EOM's intact.  Neck: no adenopathy, no carotid bruit, no JVD, supple, symmetrical, trachea midline and thyroid not enlarged, symmetric, no tenderness/mass/nodules Resp: clear to auscultation bilaterally Cardio: regular rate and rhythm, S1, S2 normal, no murmur, click, rub or gallop GI: Abdomen is tender in the right upper quadrant without any rebound, rigidity or guarding. Bowel sounds are present. No masses or organomegaly. Extremities: extremities normal, atraumatic, no cyanosis or edema Pulses: 2+ and symmetric Skin: Skin color, texture, turgor normal. No rashes or lesions Lymph nodes: Cervical, supraclavicular, and axillary nodes normal. Neurologic: Awake and alert. Somewhat distracted. Oriented 3. Cranial nerves II-12 intact. Motor strength equal bilateral upper and lower extremities.  Laboratory Data:  Lab reports from outside facility reviewed. WBC is 10.4. Hemoglobin 15.6. Platelet count 290. Sodium 133. Potassium was initially 6.1. She was given bicarbonate and other treatments, and potassium improved to 3.8 when they repeated  the blood work. Chloride 95, bicarbonate 20, BUN 9, creatinine 0.49. Glucose 170.  ALT was initially 297 and then 910 at repeat check. AST was 517 and 1220 at repeat check. Lipase was 36.  Radiology Reports:  CT scan of the abdomen, pelvis was done at the outside facility. Impressions from that report: Mildly increasing biliary ductal dilatation with pneumobilia, postcholecystectomy, and foci of air within the common bile duct. Cannot exclude stones. MRCP was recommended. Linear hypodensity within the spleen could be scarring or infarct. Other chronic findings were noted.  The actual report is in the patient's physical chart.  No results found.   Problem List  Principal Problem:   Transaminitis Active Problems:   Essential hypertension, benign   S/P lobectomy of lung   Abdominal aortic aneurysm (HCC)   Choledocholithiasis   Assessment: This is a 81 year old Caucasian female with past medical history as stated earlier, who presents with abdominal pain since last night. She has significantly elevated LFTs with hyperbilirubinemia. She did have a low-grade fever in the emergency department. Her WBC is however normal. CT scan done at the outside facility, did suggest choledocholithiasis. Interestingly, patient was seen for dilated bile ducts last year by gastroenterology and underwent ERCP which was unremarkable.  Plan: #1 Abdominal pain with transaminitis and hyperbilirubinemia: Concern is for choledocholithiasis. She did have fever and chills. No definite stones were seen in the CBD, but 2 foci of air was noted. We will for now, treat her with broad-spectrum antibiotics in the form of Zosyn. Gastroenterology has been consulted. MRCP has been recommended, which will be ordered. Patient will most likely end up requiring ERCP. Antibiotics can be discontinued if MRCP is consistent with CBD stones. Of note, patient was given Levaquin and cefepime at the outside facility prior to transfer.  #2  history of essential hypertension: Continue with beta blocker at lower dose. Hydralazine as needed.  #3 history of aortic valve replacement: She is followed by cardiology. She is not noted to be on any anticoagulation. Monitor for now.  #4 history of abdominal aortic aneurysm: Being monitored as an outpatient  DVT Prophylaxis: SCDs Code Status: Full code Family Communication: Discussed with the patient's son, patient as well as patient's neice Theme park manager, who is a Marine scientist) with her permission  Disposition Plan: Admit to telemetry   Further management decisions will depend on results of further testing and patient's response to treatment.   Bonnielee Haff  [facility] Pager [phone]  If 7PM-7AM, please contact night-coverage [email or web address] Password [facility]  01/12/2016, 4:12 PM

## 2016-01-12 NOTE — Progress Notes (Addendum)
Pharmacy Antibiotic Note  Laurie [name] is a 81 y.o. female admitted on 01/12/2016 with intra-abdominal infection.  Pharmacy has been consulted for zosyn dosing.  Plan: Zosyn 3.375g IV Q8H infused over 4hrs.  Updated zosyn dosing based on recent renal function  Height: '5\' 7"'$  (170.2 cm) Weight: 161 lb 6 oz (73.2 kg) IBW/kg (Calculated) : 61.6  Temp (24hrs), Avg:98.8 F (37.1 C), Min:98.8 F (37.1 C), Max:98.8 F (37.1 C)  No results for input(s): WBC, CREATININE, LATICACIDVEN, VANCOTROUGH, VANCOPEAK, VANCORANDOM, GENTTROUGH, GENTPEAK, GENTRANDOM, TOBRATROUGH, TOBRAPEAK, TOBRARND, AMIKACINPEAK, AMIKACINTROU, AMIKACIN in the last 168 hours.  CrCl cannot be calculated (Patient has no serum creatinine result on file.).    Allergies  Allergen Reactions  . Hyoscyamine Other (See Comments)    Mental status changes.   . Statins     Muscle aches  . Rofecoxib Rash    Antimicrobials this admission: 2/12 >> Zosyn >>  Dose adjustments this admission: N/A  Microbiology results: 2/12 Bld x 2: Ordered  Thank you for allowing pharmacy to be a part of this patient's care.  Ralene Bathe, PharmD, BCPS 01/12/2016, 4:05 PM  Pager: [phone]

## 2016-01-13 ENCOUNTER — Inpatient Hospital Stay (HOSPITAL_COMMUNITY): Payer: Commercial Managed Care - HMO | Admitting: Anesthesiology

## 2016-01-13 ENCOUNTER — Encounter (HOSPITAL_COMMUNITY): Payer: Self-pay | Admitting: *Deleted

## 2016-01-13 ENCOUNTER — Inpatient Hospital Stay (HOSPITAL_COMMUNITY): Payer: Commercial Managed Care - HMO

## 2016-01-13 ENCOUNTER — Encounter (HOSPITAL_COMMUNITY)
Admission: AD | Disposition: A | Discharge status: Home Health | Payer: Self-pay | Source: Other Acute Inpatient Hospital | Attending: Internal Medicine

## 2016-01-13 DIAGNOSIS — I714 Abdominal aortic aneurysm, without rupture: Secondary | ICD-10-CM

## 2016-01-13 DIAGNOSIS — K838 Other specified diseases of biliary tract: Secondary | ICD-10-CM

## 2016-01-13 DIAGNOSIS — R1011 Right upper quadrant pain: Secondary | ICD-10-CM | POA: Insufficient documentation

## 2016-01-13 DIAGNOSIS — J439 Emphysema, unspecified: Secondary | ICD-10-CM | POA: Insufficient documentation

## 2016-01-13 DIAGNOSIS — I359 Nonrheumatic aortic valve disorder, unspecified: Secondary | ICD-10-CM

## 2016-01-13 DIAGNOSIS — R17 Unspecified jaundice: Secondary | ICD-10-CM

## 2016-01-13 HISTORY — PX: ERCP: SHX5425

## 2016-01-13 LAB — COMPREHENSIVE METABOLIC PANEL
ALK PHOS: 151 U/L — AB (ref 38–126)
ALT: 523 U/L — AB (ref 14–54)
AST: 305 U/L — ABNORMAL HIGH (ref 15–41)
Albumin: 2.9 g/dL — ABNORMAL LOW (ref 3.5–5.0)
Anion gap: 10 (ref 5–15)
BUN: 16 mg/dL (ref 6–20)
CALCIUM: 8.3 mg/dL — AB (ref 8.9–10.3)
CO2: 24 mmol/L (ref 22–32)
CREATININE: 0.69 mg/dL (ref 0.44–1.00)
Chloride: 104 mmol/L (ref 101–111)
GFR calc non Af Amer: >60 mL/min (ref >60)
GLUCOSE: 90 mg/dL (ref 65–99)
Potassium: 3.5 mmol/L (ref 3.5–5.1)
SODIUM: 138 mmol/L (ref 135–145)
Total Bilirubin: 5 mg/dL — ABNORMAL HIGH (ref 0.3–1.2)
Total Protein: 5.4 g/dL — ABNORMAL LOW (ref 6.5–8.1)

## 2016-01-13 LAB — CBC
HCT: 39.2 % (ref 36.0–46.0)
Hemoglobin: 12.9 g/dL (ref 12.0–15.0)
MCH: 29.4 pg (ref 26.0–34.0)
MCHC: 32.9 g/dL (ref 30.0–36.0)
MCV: 89.3 fL (ref 78.0–100.0)
PLATELETS: 175 10*3/uL (ref 150–400)
RBC: 4.39 MIL/uL (ref 3.87–5.11)
RDW: 13.5 % (ref 11.5–15.5)
WBC: 10.6 10*3/uL — ABNORMAL HIGH (ref 4.0–10.5)

## 2016-01-13 SURGERY — ERCP, WITH INTERVENTION IF INDICATED
Anesthesia: Monitor Anesthesia Care

## 2016-01-13 MED ORDER — SODIUM CHLORIDE 0.9 % IV SOLN: INTRAVENOUS | Status: DC

## 2016-01-13 MED ORDER — PHENYLEPHRINE HCL 10 MG/ML IJ SOLN
INTRAMUSCULAR | Status: DC | PRN
  Administered 2016-01-13: 200 ug via INTRAVENOUS
  Administered 2016-01-13: 80 ug via INTRAVENOUS
  Administered 2016-01-13: 120 ug via INTRAVENOUS

## 2016-01-13 MED ORDER — FENTANYL CITRATE (PF) 100 MCG/2ML IJ SOLN
INTRAMUSCULAR | Status: AC
  Filled 2016-01-13: qty 2

## 2016-01-13 MED ORDER — SODIUM CHLORIDE 0.9 % IV BOLUS (SEPSIS)
500.0000 mL | Freq: Once | INTRAVENOUS | Status: AC
  Administered 2016-01-13: 500 mL via INTRAVENOUS

## 2016-01-13 MED ORDER — LIDOCAINE HCL (CARDIAC) 20 MG/ML IV SOLN
INTRAVENOUS | Status: AC
  Filled 2016-01-13: qty 5

## 2016-01-13 MED ORDER — LACTATED RINGERS IV SOLN: INTRAVENOUS | Status: DC

## 2016-01-13 MED ORDER — METOPROLOL TARTRATE 1 MG/ML IV SOLN
2.5000 mg | Freq: Once | INTRAVENOUS | Status: AC
  Administered 2016-01-13: 2.5 mg via INTRAVENOUS
  Filled 2016-01-13: qty 5

## 2016-01-13 MED ORDER — PHENYLEPHRINE HCL 10 MG/ML IJ SOLN
INTRAMUSCULAR | Status: AC
  Filled 2016-01-13: qty 1

## 2016-01-13 MED ORDER — LIDOCAINE HCL (CARDIAC) 20 MG/ML IV SOLN
INTRAVENOUS | Status: DC | PRN
  Administered 2016-01-13: 100 mg via INTRAVENOUS

## 2016-01-13 MED ORDER — LACTATED RINGERS IV SOLN
INTRAVENOUS | Status: DC | PRN
  Administered 2016-01-13 (×2): via INTRAVENOUS

## 2016-01-13 MED ORDER — ONDANSETRON HCL 4 MG/2ML IJ SOLN
INTRAMUSCULAR | Status: AC
  Filled 2016-01-13: qty 2

## 2016-01-13 MED ORDER — SODIUM CHLORIDE 0.9 % IJ SOLN
INTRAMUSCULAR | Status: DC | PRN
  Administered 2016-01-13: 90 mL

## 2016-01-13 MED ORDER — GLUCAGON HCL RDNA (DIAGNOSTIC) 1 MG IJ SOLR
INTRAMUSCULAR | Status: DC | PRN
  Administered 2016-01-13: .5 mg via INTRAVENOUS

## 2016-01-13 MED ORDER — SODIUM CHLORIDE 0.9 % IV SOLN
10.0000 mg | INTRAVENOUS | Status: DC | PRN
  Administered 2016-01-13: 100 ug/min via INTRAVENOUS

## 2016-01-13 MED ORDER — INDOMETHACIN 50 MG RE SUPP
100.0000 mg | Freq: Once | RECTAL | Status: AC
Start: 2016-01-13 — End: 2016-01-13
  Administered 2016-01-13: 100 mg via RECTAL
  Filled 2016-01-13: qty 2

## 2016-01-13 MED ORDER — SUCCINYLCHOLINE CHLORIDE 20 MG/ML IJ SOLN
INTRAMUSCULAR | Status: DC | PRN
  Administered 2016-01-13: 100 mg via INTRAVENOUS

## 2016-01-13 MED ORDER — FENTANYL CITRATE (PF) 100 MCG/2ML IJ SOLN
INTRAMUSCULAR | Status: DC | PRN
  Administered 2016-01-13: 12.5 ug via INTRAVENOUS

## 2016-01-13 MED ORDER — PROPOFOL 10 MG/ML IV BOLUS
INTRAVENOUS | Status: DC | PRN
  Administered 2016-01-13 (×2): 25 mg via INTRAVENOUS
  Administered 2016-01-13: 50 mg via INTRAVENOUS

## 2016-01-13 MED ORDER — PROPOFOL 10 MG/ML IV BOLUS
INTRAVENOUS | Status: AC
  Filled 2016-01-13: qty 20

## 2016-01-13 MED ORDER — GLUCAGON HCL RDNA (DIAGNOSTIC) 1 MG IJ SOLR
INTRAMUSCULAR | Status: AC
  Filled 2016-01-13: qty 1

## 2016-01-13 MED ORDER — ONDANSETRON HCL 4 MG/2ML IJ SOLN
INTRAMUSCULAR | Status: DC | PRN
  Administered 2016-01-13: 4 mg via INTRAVENOUS

## 2016-01-13 NOTE — Op Note (Signed)
[facility] [place], [address]   ERCP PROCEDURE REPORT  PATIENT: Laurie, [name]  MR#: [id number] BIRTHDATE: October 16, 1935  GENDER: female  ENDOSCOPIST: Wilfrid Lund, MD REFERRED BY:  PROCEDURE DATE:  01/13/2016 PROCEDURE:   ERCP with sphincterotomy/papillotomy and ERCP with removal of calculus/calculi INDICATIONS:abdominal pain in upper right quadrant.   abnormal liver function test .   abnormal abdominal CT.  MEDICATIONS: GETA Zosyn 3.75 grams given during procedure TOPICAL ANESTHETIC:  DESCRIPTION OF PROCEDURE:     Physical exam was performed.  Informed consent was obtained from the patient after explaining the benefits, risks, and alternatives to the procedure.  The patient was connected to the monitor and placed in the semi-prone position. IV medicine was administered through an indwelling cannula and oxygen via endotracheal tube.  After administration of sedation, the patients esophagus was intubated and the duodenoscope, # [id number]       endoscope was advanced under direct visualization to the second portion of the duodenum.   Estimated blood loss is zero unless otherwise noted in this procedure report.  There was a large periampullary diverticulum, with the major papilla inside it at the 10 o'clock position.  There had been a prior sphincterotomy, partially closed. The bile duct was selectively cannulated with a sphincterotome and a wire passed to the right hepatic ductal system.  Contrast injection revealed a dilated CBD to a maximal diameter of 34m proximally and 157mdistally.  There were multiple large stones. The biliary sphincterotomy was extended, and multiple balloon sweeps removed a large amount of stones and debris.  The bile was also purulent.  Occlusion cholangiogram confirmed ductal clearance, though there were 2 small air bubbles.    The scope was then completely withdrawn from the patient and the procedure  terminated.    COMPLICATIONS:    None  ENDOSCOPIC IMPRESSION: There was a large periampullary diverticulum, with the major papilla insude it at the 10 o'clock position.  There had been a prior sphincterotomy, partially closed. The bile duct was selectively cannulated with a sphincterotome and a wire passed to the right hepatic ductal system.  Contrast injection revealed a dilated CBD to a maximal diameter of 1835mroximally and 63m31mstally.  There were multiple large stones. The biliary sphincterotomy was extended, and multiple balloon sweeps removed a large amount of stones and debris.  The bile was also purulent.  Occlusion cholangiogram confirmed ductal clearance, though there were 2 small air bubbles  Choledocholithiasis  Cholangitis  RECOMMENDATIONS: Continue zosyn and follow blood culture results.  If there are no events overnight, the patient can be discharged home tomorrow on at least 7 days of antibiotics (perhaps longer if cultures positive). No aspirin for 14 days.    _______________________________ eSigLorrin MaisWilfrid Lund 01/13/2016 1:13 PM   PATIENT NAME:  Laurie, [name]: [id number]

## 2016-01-13 NOTE — Anesthesia Procedure Notes (Signed)
Procedure Name: Intubation Date/Time: 01/13/2016 12:10 PM Performed by: Lind Covert Pre-anesthesia Checklist: Patient identified, Timeout performed, Emergency Drugs available, Suction available and Patient being monitored Patient Re-evaluated:Patient Re-evaluated prior to inductionOxygen Delivery Method: Circle system utilized Preoxygenation: Pre-oxygenation with 100% oxygen Intubation Type: IV induction Laryngoscope Size: Mac and 3 Grade View: Grade II Tube type: Oral Tube size: 7.0 mm Number of attempts: 1 Airway Equipment and Method: Stylet Placement Confirmation: ETT inserted through vocal cords under direct vision,  breath sounds checked- equal and bilateral and positive ETCO2 Secured at: 21 cm Tube secured with: Tape Dental Injury: Teeth and Oropharynx as per pre-operative assessment

## 2016-01-13 NOTE — Anesthesia Preprocedure Evaluation (Addendum)
Anesthesia Evaluation  Patient identified by MRN, date of birth, ID band Patient awake    Reviewed: Allergy & Precautions, H&P , NPO status , Patient's Chart, lab work & pertinent test results, reviewed documented beta blocker date and time   Airway Mallampati: I  TM Distance: >3 FB Neck ROM: Full    Dental  (+) Dental Advisory Given, Caps All front upper are capped:   Pulmonary neg pulmonary ROS, COPD, former smoker,  Lung cancer. S/p lobectomy   Pulmonary exam normal breath sounds clear to auscultation       Cardiovascular hypertension, Pt. on medications and Pt. on home beta blockers + CAD and + Peripheral Vascular Disease  Normal cardiovascular exam+ dysrhythmias Atrial Fibrillation + Valvular Problems/Murmurs AS  Rhythm:Regular Rate:Normal  - Left ventricle: The cavity size was normal. Wall thicknesswas increased in a pattern of moderate LVH. Systolicfunction was normal. The estimated ejection fraction wasin the range of 60% to 65%. Wall motion was normal; therewere no regional wall motion abnormalities. Dopplerparameters are consistent with abnormal left ventricularrelaxation (grade 1 diastolic dysfunction). - Aortic valve: There was a bioprosthetic aortic valve. No significant bioprosthetic aortic valve regurgitation or stenosis. Peak gradient: 48m Hg (S). - Mitral valve: No significant regurgitation. - Left atrium: The atrium was mildly dilated. - Right ventricle: The cavity size was normal. Systolic function was normal. - Pulmonary arteries: No complete TR doppler jet so unableto estimate PA systolic pressure. - Inferior vena cava: The vessel was normal in size; the respirophasic diameter changes were in the normal range (=50%); findings are consistent with normal central venouspressure.  Impressions: - Technically difficult study with poor acoustic windows. Normal LV size with moderate LV hypertrophy. EF  60-65%.Normal RV size and systolic function. There was abioprosthetic aortic valve that appeared to have normalfunction.    Neuro/Psych  Headaches, PSYCHIATRIC DISORDERS Anxiety Cerebrovascular disease    GI/Hepatic negative GI ROS, Neg liver ROS,   Endo/Other  negative endocrine ROS  Renal/GU negative Renal ROS     Musculoskeletal negative musculoskeletal ROS (+)   Abdominal   Peds  Hematology  (+) anemia ,   Anesthesia Other Findings   Reproductive/Obstetrics                            Anesthesia Physical Anesthesia Plan  ASA: III  Anesthesia Plan: MAC   Post-op Pain Management:    Induction:   Airway Management Planned:   Additional Equipment:   Intra-op Plan:   Post-operative Plan:   Informed Consent:   Plan Discussed with: Surgeon  Anesthesia Plan Comments:         Anesthesia Quick Evaluation

## 2016-01-13 NOTE — Interval H&P Note (Signed)
History and Physical Interval Note:  01/13/2016 11:49 AM  Laurie [name]  has presented today for surgery, with the diagnosis of choledocholithiasis, abnl LFTs  The various methods of treatment have been discussed with the patient and family. After consideration of risks, benefits and other options for treatment, the patient has consented to  Procedure(s): ENDOSCOPIC RETROGRADE CHOLANGIOPANCREATOGRAPHY (ERCP) (N/A) as a surgical intervention .  The patient's history has been reviewed, patient examined, no change in status, stable for surgery.  I have reviewed the patient's chart and labs.  Questions were answered to the patient's satisfaction.     [name]

## 2016-01-13 NOTE — H&P (View-Only) (Signed)
Referring Provider: [facility] Primary Care Physician:  Beatrice Lecher, MD Primary Gastroenterologist:  Dr. Deatra Ina  Reason for Consultation:  Epigastric pain, abnormal LFTs.     HPI: Laurie [name] is a 81 y.o. female who has a history of aortic stenosis status post aortic valve replacement in September 2009. She has a pericardial tissue valve. She also has had a septal myectomy in the right middle lobectomy due to lung cancer. She has a history of COPD, postoperative atrial fibrillation, hypertension, CVA, abdominal aortic aneurysm, iliac aneurysm, and coronary artery disease. In 2005 she was evaluated by Dr. Liane Comber in [place] due to anemia. She had a colonoscopy, upper endoscopy, and pill endoscopy and was found to have AVMs. It was at that time she was diagnosed with aortic stenosis and subsequently had a aortic valve replacement.  In January 2015 she had a laparoscopic cholecystectomy by Dr. Rosendo Gros. She was evaluated in the GI office in May 2016 with ongoing epigastric pain. She had been evaluated by Dr. Stanford Breed for follow-up of her aneurysm and had had a CTA of the abdomen and pelvis that revealed intra-and extrahepatic biliary ductal dilatation with slight increased diameter of the CBD to 1.4 cm with abrupt distal tapering felt to possibly represent a stricture. She underwent an ERCP by Dr. Deatra Ina on 05/08/2015 and was noted to have a large periampullary diverticulum. No stones were noted. She was feeling fairly well until then until the 11th when she again began to develop severe abdominal pain across the upper abdomen but worse on the right. The intensity would get up to a 10 out of 10. It radiated to the back to the right scapula. She was unable to identify any thing that would make it better or worse. Her pain was associated with nausea and she had 2 episodes of bilious vomiting. She felt as if she had chills. She did not have any diarrhea. She was evaluated in the  [facility] emergency department where LFTs were noted to be elevated. CT scan was done which showed biliary ductal dilatation with concern for possible stones in the CBD. She was transferred to [facility]. Laboratory studies from outside facility revealed WBC 10.4, hemoglobin 15.5, platelets 290,000. ALT was initially to 97 and then 910 and repeat check. AST was 517 and 1224. He checked. Lipase was 36. CT of the abdomen and pelvis done at outside facility revealed mildly increasing biliary ductal dilatation with pneumobilia, postcholecystectomy, and foci of air within the common bile duct. Cannot exclude stones. MRCP was recommended. Linear hypodensity within the spleen could be scarring or infarct.   Past Medical History  Diagnosis Date  . Anemia     iron deficiency  . Shingles 11-09  . COPD (chronic obstructive pulmonary disease) ([place]) 2005    on CXR with R apical scaring  . Atrial fibrillation ([place])     post op  . Hypertension   . Cerebrovascular disease   . S/P aortic valve replacement 08/28/2008    [facility]  . S/P lobectomy of lung 08/28/2008    rt middle lobe  dr. Roxy Manns  . AAA (abdominal aortic aneurysm) ([place])   . CAD (coronary artery disease)   . Hypercholesterolemia     Hx: of  . Headache(784.0)     Hx: of migraines in the past  . Carcinoma of lung ([place]) 08/28/2008    T2N0 moderately diff. squamous cell CA  . Ascending aorta dilatation [facility])     Past Surgical History  Procedure Laterality Date  . Aapy    . Tonsillectomy and adenoidectomy    . U/s guided aspiration of r breast cyst   2009    at the breast clinic  . Rml removed dr Ricard Dillon for lung cancer  08/28/2008    T2N0 squamous cell CA  . Cardiac valve replacement    . Abdominal surgery    . Tonsillectomy    . Aortic valve replacement  08/28/2008    [facility]  . Cardiac valve surgery  08/28/2008    septal myomectomy  . Colonoscopy      Hx: of  .  Cataract extraction w/ intraocular lens  implant, bilateral      Hx:of  . Dilation and curettage of uterus    . Cardiac catheterization    . Appendectomy    . Cholecystectomy N/A 12/13/2013    Procedure: LAPAROSCOPIC CHOLECYSTECTOMY;  Surgeon: ARalene Ok MD;  [place] [facility]  Service: General;  Laterality: N/A;  . Ercp N/A 05/08/2015    Procedure: ENDOSCOPIC RETROGRADE CHOLANGIOPANCREATOGRAPHY (ERCP);  Surgeon: RInda Castle MD;  [place] [facility]  Service: Endoscopy;  Laterality: N/A;    Prior to Admission medications   Medication Sig Start Date End Date Taking? Authorizing Provider  acetaminophen (TYLENOL) 500 MG tablet Take 500 mg by mouth every 6 (six) hours as needed for moderate pain, fever or headache. Pain    Yes Historical Provider, MD  ALPRAZolam (XANAX) 0.5 MG tablet TAKE 1 TABLET BY MOUTH TWICE A DAY Patient taking differently: TAKE .25-0.5 TABLET BY MOUTH TWICE A DAY as needed for anxiety 11/13/15  Yes CHali Marry MD  AMBULATORY NON FORMULARY MEDICATION Medication Name: ABI [facility] both lower [facility]. [facility] 11/19/15  Yes CHali Marry MD  amoxicillin (AMOXIL) 500 MG capsule Take 4 capsules (2,000 mg total) by mouth once. Take 30 to 60 mins prior to procedure 01/10/16  Yes CHali Marry MD  aspirin 81 MG tablet Take 81 mg by mouth daily.     Yes Historical Provider, MD  dicyclomine (BENTYL) 20 MG tablet Take 0.5-1 tablets (10-20 mg total) by mouth every 8 (eight) hours as needed for spasms. 11/19/15  Yes CHali Marry MD  lisinopril (PRINIVIL,ZESTRIL) 40 MG tablet TAKE 1 TABLET (40 MG TOTAL) BY MOUTH DAILY. 08/09/15  Yes CHali Marry MD  metoprolol (LOPRESSOR) 50 MG tablet Take 1.5 tablets (75 mg total) by mouth 2 (two) times daily. 08/21/15  Yes BLelon Perla MD  ofloxacin (FLOXIN) 0.3 % otic solution Place 5 drops into the right ear 2 (two) times daily.    Yes Historical Provider, MD  pantoprazole  (PROTONIX) 40 MG tablet TAKE 1 TABLET (40 MG TOTAL) BY MOUTH DAILY. Patient taking differently: TAKE 1 TABLET (40 MG TOTAL) BY MOUTH DAILY as needed for heartburn/acid reflux 12/09/15  Yes CHali Marry MD  polyethylene glycol powder (GLYCOLAX/MIRALAX) powder Take 1-2 capfuls daily in water Patient taking differently: Take 1 Container by mouth daily as needed for mild constipation or moderate constipation.  04/25/15  Yes Yee Joss P Kimani Bedoya, PA-C  traMADol (ULTRAM) 50 MG tablet Take 1 tablet (50 mg total) by mouth every 8 (eight) hours as needed. Patient taking differently: Take 50 mg by mouth every 8 (eight) hours as needed for moderate pain.  11/19/15  Yes CHali Marry MD    Current Facility-Administered Medications  Medication Dose Route Frequency Provider Last Rate Last Dose  .  0.9 %  sodium chloride infusion   Intravenous Continuous Bonnielee Haff, MD 75 mL/hr at 01/13/16 0400    . acetaminophen (TYLENOL) tablet 650 mg  650 mg Oral Q6H PRN Bonnielee Haff, MD       Or  . acetaminophen (TYLENOL) suppository 650 mg  650 mg Rectal Q6H PRN Bonnielee Haff, MD      . albuterol (PROVENTIL) (2.5 MG/3ML) 0.083% nebulizer solution 2.5 mg  2.5 mg Nebulization Q2H PRN Bonnielee Haff, MD      . hydrALAZINE (APRESOLINE) injection 10 mg  10 mg Intravenous Q6H PRN Bonnielee Haff, MD      . HYDROmorphone (DILAUDID) injection 0.5-1 mg  0.5-1 mg Intravenous Q3H PRN Bonnielee Haff, MD   1 mg at 01/13/16 0554  . metoprolol (LOPRESSOR) tablet 50 mg  50 mg Oral BID Bonnielee Haff, MD      . ondansetron [facility]) tablet 4 mg  4 mg Oral Q6H PRN Bonnielee Haff, MD       Or  . ondansetron (ZOFRAN) injection 4 mg  4 mg Intravenous Q6H PRN Bonnielee Haff, MD   4 mg at 01/13/16 0154  . piperacillin-tazobactam (ZOSYN) IVPB 3.375 g  3.375 g Intravenous 3 times per day Bonnielee Haff, MD   3.375 g at 01/13/16 0554  . sodium chloride flush (NS) 0.9 % injection 3 mL  3 mL Intravenous Q12H Bonnielee Haff, MD   3 mL  at 01/12/16 2200    Allergies as of 01/12/2016 - Review Complete 01/12/2016  Allergen Reaction Noted  . Hyoscyamine Other (See Comments) 11/19/2015  . Statins  07/13/2011  . Rofecoxib Rash     Family History  Problem Relation Age of Onset  . Stroke Father   . Diabetes Son     brittle diabetes    Social History   Social History  . Marital Status: Single    Spouse Name: N/A  . Number of Children: N/A  . Years of Education: N/A   Occupational History  . Not on file.   Social History Main Topics  . Smoking status: Former Smoker -- 40 years  . Smokeless tobacco: Never Used  . Alcohol Use: No  . Drug Use: No  . Sexual Activity: Not on file   Other Topics Concern  . Not on file   Social History Narrative    Review of Systems: Gen: Admits to chills and malaise. CV: Denies chest pain, angina, palpitations, syncope, orthopnea, PND, peripheral edema, and claudication. Resp: Denies dyspnea at rest, dyspnea with exercise, cough, sputum, wheezing, coughing up blood, and pleurisy. GI: Admits to nausea, vomiting, epigastric and right upper quadrant pain, and jaundice. GU : Denies urinary burning, blood in urine, urinary frequency, urinary hesitancy, nocturnal urination, and urinary incontinence. MS: Denies joint pain, limitation of movement, and swelling, stiffness, low back pain, extremity pain. Denies muscle weakness, cramps, atrophy.  Derm: Denies rash, itching, dry skin, hives, moles, warts, or unhealing ulcers.  Psych: Denies depression, anxiety, memory loss, suicidal ideation, hallucinations, paranoia, and confusion. Heme: Denies bruising, bleeding, and enlarged lymph nodes. Neuro:  Denies any headaches, dizziness, paresthesias. Endo:  Denies any problems with DM, thyroid, adrenal function.  Physical Exam: Vital signs in last 24 hours: Temp:  [97.4 F (36.3 C)-98.8 F (37.1 C)] 97.7 F (36.5 C) (02/13 0422) Pulse Rate:  [72-93] 72 (02/12 2100) Resp:  [20] 20 (02/12  2100) BP: (129-142)/(55-67) 142/67 mmHg (02/13 0422) SpO2:  [91 %-98 %] 91 % (02/13 0422) Weight:  [161 lb 6 oz (  73.2 kg)] 161 lb 6 oz (73.2 kg) (02/12 1529) Last BM Date: 01/12/16 General:   Alert,  Well-developed, jaundiced, appears comfortable. Head:  Normocephalic and atraumatic. Eyes:  Sclera icteric  Conjunctiva pink. Ears:  Normal auditory acuity. Nose:  No deformity, discharge,  or lesions. Mouth:  No deformity or lesions.   Neck:  Supple; no masses or thyromegaly. Lungs:  Clear throughout to auscultation.     Heart:  Regular rate and rhythm; 9-7/8 systolic murmur Abdomen:  Soft, nondistended, tender right upper quadrant and epigastric area without rebound or guarding, positive bowel sounds.   Rectal:  Deferred  Msk:  Symmetrical without gross deformities. . Pulses:  Normal pulses noted. Extremities:  Without clubbing or edema. Neurologic: Alert and  oriented x4;  grossly normal neurologically. Skin: Intact without significant lesions or rashes.. Psych: Alert and cooperative. Normal mood and affect.  Intake/Output from previous day: 02/12 0701 - 02/13 0700 In: 1091.3 [I.V.:1091.3] Out: 100 [Urine:100] Intake/Output this shift:    Lab Results:  Recent Labs  01/13/16 0609  WBC 10.6*  HGB 12.9  HCT 39.2  PLT 175   BMET  Recent Labs  01/12/16 1605 01/13/16 0609  NA 135 138  K 4.0 3.5  CL 99* 104  CO2 24 24  GLUCOSE 150* 90  BUN 13 16  CREATININE 0.69 0.69  CALCIUM 8.8* 8.3*   LFT  Recent Labs  01/13/16 0609  PROT 5.4*  ALBUMIN 2.9*  AST PENDING  ALT 523*  ALKPHOS 151*  BILITOT 5.0*   PT/INR  Recent Labs  01/12/16 1605  LABPROT 14.5  INR 1.11       PROCEDURES: ERCP 05/08/15:IMPRESSIONS: diffuse dilation of the biliary system without obvious stricture Etiology for upper abdominal pain uncertain RECOMMENDATIONS: Trial of hyomax 0.'375mg'$  bid for 5 days OV 3-4 weeks   IMPRESSION/PLAN: 81 year old female admitted with abdominal pain,  hyperbilirubinemia, and elevated LFTs. White blood count currently normal. Patient currently on Zosyn.(Patient was given Levaquin and cefepime at [facility] facility).  Bilirubin has climbed to 5 this morning, and patient is jaundiced with continued complaints of severe right upper quadrant pain. CT at outside facility suggestive of choledocholithiasis. MRCP was ordered, but in light of patient's worsening pain and climbing bilirubin, is felt to not be necessary and has been cancelled. We will plan on ERCP later today.The risks, benefits, and possible complications of the procedure, including  But not limited to bleeding, perforation, surgery, and the 5-10% risk for pancreatitis, were explained to the patient.  It was explained that  post ERCP pancreatitis which, while usually is mild, occasionally maybe severe and even life-threatening.  Patient's questions were answered. Procedure will be performed by Dr. Loletha Carrow.  History aortic valve replacement History abdominal aortic aneurysm History hypertension History abdominal aortic aneurysm CAD History hypercholesterolemia History carcinoma of lung T2 N0 moderately differentiated squamous cell CA  Rhett Mutschler P PA-C 01/13/2016,  Pager [phone]  Mon-Fri 8a-5p [phone] after 5p, weekends, holidays

## 2016-01-13 NOTE — Progress Notes (Addendum)
[facility] PROGRESS NOTE  Laurie [name] [id number]:[id number] DOB: Nov 09, 1935 DOA: 01/12/2016 PCP: Beatrice Lecher, MD  Length of stay: 1   Assessment/Plan: Principal Problem:   Transaminitis Active Problems:   Essential hypertension, benign   S/P lobectomy of lung   Abdominal aortic aneurysm (HCC)   Choledocholithiasis   RUQ abdominal pain   Jaundice   Pulmonary emphysema (HCC)   Aortic valve disease   Dilated bile duct     HPI: Laurie [name] is a 81 y.o. female with a past medical history of hypertension, abdominal aortic aneurysm, aortic valve replacement who is status post cholecystectomy in 2015 presented to the [facility] with complaints of abdominal pain.  The pain was located in the upper abdomen, more towards the right. She was evaluated in the [facility] Emergency department. LFTs were noted to be significantly high. CT scan was done which showed biliary ductal palpitation with concern for possible stones in the common bile duct. Patient was subsequently transferred over to [facility] for evaluation by gastroenterology.  Assessment and plan #1 Abdominal pain with transaminitis  , acute cholangitis with gram-negative rods Concern is for choledocholithiasis. She did have fever and chills. No definite stones were seen in the CBD, but 2 foci of air was noted. Cont  Zosyn. Gastroenterology has been consulted. High suspicion of CBD stone(s), therefore will proceed with ERCP and cancel MRCP per GI .Patient was given Levaquin and cefepime at the outside facility prior to transfer. She also has positive blood cultures showing gram negative  Bacilli, from outside facility, ERCP showed Choledocholithiasis, Cholangitis.  Await precise speciation and sensitivity. Would treat with antibiotics for 14 days  #2  Essential hypertension: Continue with beta blocker at lower dose. Hydralazine as needed.  #3 history of aortic valve  replacement: She is followed by cardiology. She is not noted to be on any anticoagulation. Monitor for now.  #4 history of abdominal aortic aneurysm: Being monitored as an outpatient   DVT prophylaxsis lovenox   Code Status:      Code Status Orders        Start     Ordered   01/12/16 1552  Full code   Continuous     01/12/16 1551     Family Communication: Discussed in detail with the patient, all imaging results, lab results explained to the patient   Disposition Plan:  Follow culture and dc in 2-3 days      Consultants:  GI   Procedures:  ERCP   Antibiotics: Anti-infectives    Start     Dose/Rate Route Frequency Ordered Stop   01/12/16 2200  [MAR Hold]  piperacillin-tazobactam (ZOSYN) IVPB 3.375 g     (MAR Hold since 01/13/16 1138)   3.375 g 12.5 mL/hr over 240 Minutes Intravenous 3 times per day 01/12/16 1658     01/12/16 1615  piperacillin-tazobactam (ZOSYN) IVPB 3.375 g     3.375 g 100 mL/hr over 30 Minutes Intravenous NOW 01/12/16 1604 01/12/16 1712         HPI/Subjective: Patient denies any nausea vomiting, postprocedure, hemodynamically stable except for systolic blood pressure at 96  Objective: Filed Vitals:   01/13/16 0422 01/13/16 0845 01/13/16 1140 01/13/16 1148  BP: 142/67 108/55 107/52   Pulse:  114    Temp: 97.7 F (36.5 C) 99.8 F (37.7 C)  98.2 F (36.8 C)  TempSrc: Oral Oral  Oral  Resp:  20 13   Height:  Weight:      SpO2: 91% 90% 90%     Intake/Output Summary (Last 24 hours) at 01/13/16 1254 Last data filed at 01/13/16 2694  Gross per 24 hour  Intake 1091.25 ml  Output    100 ml  Net 991.25 ml    Exam:  General: No acute respiratory distress Lungs: Clear to auscultation bilaterally without wheezes or crackles Cardiovascular: Regular rate and rhythm without murmur gallop or rub normal S1 and S2 Abdomen: Nontender, nondistended, soft, bowel sounds positive, no rebound, no ascites, no appreciable mass Extremities: No  significant cyanosis, clubbing, or edema bilateral lower extremities     Data Review   Micro Results Recent Results (from the past 240 hour(s))  Culture, blood (Routine X 2) w Reflex to ID Panel     Status: None (Preliminary result)   Collection Time: 01/12/16  4:05 PM  Result Value Ref Range Status   Specimen Description BLOOD BLOOD LEFT HAND  Final   Special Requests   Final    BOTTLES DRAWN AEROBIC AND ANAEROBIC 10CC EA Performed at [facility]    Culture PENDING  Incomplete   Report Status PENDING  Incomplete  Culture, blood (Routine X 2) w Reflex to ID Panel     Status: None (Preliminary result)   Collection Time: 01/12/16  4:15 PM  Result Value Ref Range Status   Specimen Description BLOOD BLOOD RIGHT HAND  Final   Special Requests   Final    BOTTLES DRAWN AEROBIC AND ANAEROBIC 10CC EA Performed at [facility]    Culture PENDING  Incomplete   Report Status PENDING  Incomplete    Radiology Reports No results found.   CBC  Recent Labs Lab 01/13/16 0609  WBC 10.6*  HGB 12.9  HCT 39.2  PLT 175  MCV 89.3  MCH 29.4  MCHC 32.9  RDW 13.5    Chemistries   Recent Labs Lab 01/12/16 1605 01/13/16 0609  NA 135 138  K 4.0 3.5  CL 99* 104  CO2 24 24  GLUCOSE 150* 90  BUN 13 16  CREATININE 0.69 0.69  CALCIUM 8.8* 8.3*  AST  --  305*  ALT  --  523*  ALKPHOS  --  151*  BILITOT  --  5.0*   ------------------------------------------------------------------------------------------------------------------ estimated creatinine clearance is 56.4 mL/min (by C-G formula based on Cr of 0.69). ------------------------------------------------------------------------------------------------------------------ No results for input(s): HGBA1C in the last 72 hours. ------------------------------------------------------------------------------------------------------------------ No results for input(s): CHOL, HDL, LDLCALC, TRIG, CHOLHDL, LDLDIRECT in the  last 72 hours. ------------------------------------------------------------------------------------------------------------------ No results for input(s): TSH, T4TOTAL, T3FREE, THYROIDAB in the last 72 hours.  Invalid input(s): FREET3 ------------------------------------------------------------------------------------------------------------------ No results for input(s): VITAMINB12, FOLATE, FERRITIN, TIBC, IRON, RETICCTPCT in the last 72 hours.  Coagulation profile  Recent Labs Lab 01/12/16 1605  INR 1.11    No results for input(s): DDIMER in the last 72 hours.  Cardiac Enzymes No results for input(s): CKMB, TROPONINI, MYOGLOBIN in the last 168 hours.  Invalid input(s): CK ------------------------------------------------------------------------------------------------------------------ Invalid input(s): POCBNP   CBG: No results for input(s): GLUCAP in the last 168 hours.     Studies: No results found.    Lab Results  Component Value Date   HGBA1C 6.2* 04/18/2015   HGBA1C 6.2* 11/10/2010   HGBA1C  08/24/2008    5.6 (NOTE)   The ADA recommends the following therapeutic goal for glycemic   control related to Hgb A1C measurement:   Goal of Therapy:   < 7.0% Hgb A1C  Reference: American Diabetes Association: Clinical Practice   Recommendations 2008, Diabetes Care,  2008, 31:(Suppl 1).   Lab Results  Component Value Date   LDLCALC 191* 09/18/2010   CREATININE 0.69 01/13/2016       Scheduled Meds: . [MAR Hold] metoprolol  50 mg Oral BID  . [MAR Hold] piperacillin-tazobactam (ZOSYN)  IV  3.375 g Intravenous 3 times per day  . [MAR Hold] sodium chloride flush  3 mL Intravenous Q12H   Continuous Infusions: . sodium chloride 75 mL/hr at 01/13/16 1010  . sodium chloride    . lactated ringers      Principal Problem:   Transaminitis Active Problems:   Essential hypertension, benign   S/P lobectomy of lung   Abdominal aortic aneurysm (HCC)    Choledocholithiasis   RUQ abdominal pain   Jaundice   Pulmonary emphysema (HCC)   Aortic valve disease   Dilated bile duct    Time spent: 45 minutes   [facility] Pager [phone]. If 7PM-7AM, please contact night-coverage at [email or web address], password [facility] 01/13/2016, 12:54 PM  LOS: 1 day

## 2016-01-13 NOTE — Progress Notes (Signed)
RN from [facility] called and stated that patient's second set of blood cultures is growing "Gram Negative Bacilli which is the same as the first set of cultures." RN is going to be faxing results to [phone].  MD paged with update.

## 2016-01-13 NOTE — Anesthesia Postprocedure Evaluation (Signed)
Anesthesia Post Note  Patient: Laurie [name]  Procedure(s) Performed: Procedure(s) (LRB): ENDOSCOPIC RETROGRADE CHOLANGIOPANCREATOGRAPHY (ERCP) (N/A)  Patient location during evaluation: PACU Anesthesia Type: General Level of consciousness: awake and alert Pain management: pain level controlled Vital Signs Assessment: post-procedure vital signs reviewed and stable Respiratory status: spontaneous breathing, nonlabored ventilation, respiratory function stable and patient connected to nasal cannula oxygen Cardiovascular status: blood pressure returned to baseline and stable Postop Assessment: no signs of nausea or vomiting Anesthetic complications: no    Last Vitals:  Filed Vitals:   01/13/16 1320 01/13/16 1330  BP: 126/59 109/56  Pulse: 93 88  Temp:    Resp: 20 17    Last Pain:  Filed Vitals:   01/13/16 1332  PainSc: 7                  Marchia Diguglielmo L

## 2016-01-13 NOTE — Care Management Note (Signed)
Case Management Note  Patient Details  Name: Laurie [name] MRN: [id number] Date of Birth: 08-02-1935  Subjective/Objective: 81 y/o f admitted w/elevated LFT's. From home.GI following.ERCP today.PT cons-await recc.                   Action/Plan:d/c plan home.   Expected Discharge Date:                  Expected Discharge Plan:  [place]  In-House Referral:     Discharge planning Services  CM Consult  Post Acute Care Choice:    Choice offered to:     DME Arranged:    DME Agency:     HH Arranged:    [place] Agency:     Status of Service:  In process, will continue to follow  Medicare Important Message Given:    Date Medicare IM Given:    Medicare IM give by:    Date Additional Medicare IM Given:    Additional Medicare Important Message give by:     If discussed at [place] of Stay Meetings, dates discussed:    Additional Comments:  Dessa Phi, RN 01/13/2016, 10:12 AM

## 2016-01-13 NOTE — Transfer of Care (Signed)
Immediate Anesthesia Transfer of Care Note  Patient: Laurie [name]  Procedure(s) Performed: Procedure(s): ENDOSCOPIC RETROGRADE CHOLANGIOPANCREATOGRAPHY (ERCP) (N/A)  Patient Location: PACU  Anesthesia Type:General  Level of Consciousness: sedated  Airway & Oxygen Therapy: Patient Spontanous Breathing and Patient connected to face mask oxygen  Post-op Assessment: Report given to RN and Post -op Vital signs reviewed and stable  Post vital signs: Reviewed and stable  Last Vitals:  Filed Vitals:   01/13/16 1140 01/13/16 1148  BP: 107/52   Pulse:    Temp:  36.8 C  Resp: 13     Complications: No apparent anesthesia complications

## 2016-01-13 NOTE — Consult Note (Signed)
Referring Provider: [facility] Primary Care Physician:  Beatrice Lecher, MD Primary Gastroenterologist:  Dr. Deatra Ina  Reason for Consultation:  Epigastric pain, abnormal LFTs.     HPI: Laurie [name] is a 81 y.o. female who has a history of aortic stenosis status post aortic valve replacement in September 2009. She has a pericardial tissue valve. She also has had a septal myectomy in the right middle lobectomy due to lung cancer. She has a history of COPD, postoperative atrial fibrillation, hypertension, CVA, abdominal aortic aneurysm, iliac aneurysm, and coronary artery disease. In 2005 she was evaluated by Dr. Liane Comber in [place] due to anemia. She had a colonoscopy, upper endoscopy, and pill endoscopy and was found to have AVMs. It was at that time she was diagnosed with aortic stenosis and subsequently had a aortic valve replacement.  In January 2015 she had a laparoscopic cholecystectomy by Dr. Rosendo Gros. She was evaluated in the GI office in May 2016 with ongoing epigastric pain. She had been evaluated by Dr. Stanford Breed for follow-up of her aneurysm and had had a CTA of the abdomen and pelvis that revealed intra-and extrahepatic biliary ductal dilatation with slight increased diameter of the CBD to 1.4 cm with abrupt distal tapering felt to possibly represent a stricture. She underwent an ERCP by Dr. Deatra Ina on 05/08/2015 and was noted to have a large periampullary diverticulum. No stones were noted. She was feeling fairly well until then until the 11th when she again began to develop severe abdominal pain across the upper abdomen but worse on the right. The intensity would get up to a 10 out of 10. It radiated to the back to the right scapula. She was unable to identify any thing that would make it better or worse. Her pain was associated with nausea and she had 2 episodes of bilious vomiting. She felt as if she had chills. She did not have any diarrhea. She was evaluated in the  [facility] emergency department where LFTs were noted to be elevated. CT scan was done which showed biliary ductal dilatation with concern for possible stones in the CBD. She was transferred to [facility]. Laboratory studies from outside facility revealed WBC 10.4, hemoglobin 15.5, platelets 290,000. ALT was initially to 97 and then 910 and repeat check. AST was 517 and 1224. He checked. Lipase was 36. CT of the abdomen and pelvis done at outside facility revealed mildly increasing biliary ductal dilatation with pneumobilia, postcholecystectomy, and foci of air within the common bile duct. Cannot exclude stones. MRCP was recommended. Linear hypodensity within the spleen could be scarring or infarct.   Past Medical History  Diagnosis Date  . Anemia     iron deficiency  . Shingles 11-09  . COPD (chronic obstructive pulmonary disease) ([place]) 2005    on CXR with R apical scaring  . Atrial fibrillation ([place])     post op  . Hypertension   . Cerebrovascular disease   . S/P aortic valve replacement 08/28/2008    [facility]  . S/P lobectomy of lung 08/28/2008    rt middle lobe  dr. Roxy Manns  . AAA (abdominal aortic aneurysm) ([place])   . CAD (coronary artery disease)   . Hypercholesterolemia     Hx: of  . Headache(784.0)     Hx: of migraines in the past  . Carcinoma of lung ([place]) 08/28/2008    T2N0 moderately diff. squamous cell CA  . Ascending aorta dilatation [facility])     Past Surgical History  Procedure Laterality Date  . Aapy    . Tonsillectomy and adenoidectomy    . U/s guided aspiration of r breast cyst   2009    at the breast clinic  . Rml removed dr Ricard Dillon for lung cancer  08/28/2008    T2N0 squamous cell CA  . Cardiac valve replacement    . Abdominal surgery    . Tonsillectomy    . Aortic valve replacement  08/28/2008    [facility]  . Cardiac valve surgery  08/28/2008    septal myomectomy  . Colonoscopy      Hx: of  .  Cataract extraction w/ intraocular lens  implant, bilateral      Hx:of  . Dilation and curettage of uterus    . Cardiac catheterization    . Appendectomy    . Cholecystectomy N/A 12/13/2013    Procedure: LAPAROSCOPIC CHOLECYSTECTOMY;  Surgeon: ARalene Ok MD;  [place]  Service: General;  Laterality: N/A;  . Ercp N/A 05/08/2015    Procedure: ENDOSCOPIC RETROGRADE CHOLANGIOPANCREATOGRAPHY (ERCP);  Surgeon: RInda Castle MD;  [place]  Service: Endoscopy;  Laterality: N/A;    Prior to Admission medications   Medication Sig Start Date End Date Taking? Authorizing Provider  acetaminophen (TYLENOL) 500 MG tablet Take 500 mg by mouth every 6 (six) hours as needed for moderate pain, fever or headache. Pain    Yes Historical Provider, MD  ALPRAZolam (XANAX) 0.5 MG tablet TAKE 1 TABLET BY MOUTH TWICE A DAY Patient taking differently: TAKE .25-0.5 TABLET BY MOUTH TWICE A DAY as needed for anxiety 11/13/15  Yes CHali Marry MD  AMBULATORY NON FORMULARY MEDICATION Medication Name: ABI of both lower extremities. [facility] 11/19/[facility]  Yes CHali Marry MD  amoxicillin (AMOXIL) 500 MG capsule Take 4 capsules (2,000 mg total) by mouth once. Take 30 to 60 mins prior to procedure 01/10/16  Yes CHali Marry MD  aspirin 81 MG tablet Take 81 mg by mouth daily.     Yes Historical Provider, MD  dicyclomine (BENTYL) 20 MG tablet Take 0.5-1 tablets (10-20 mg total) by mouth every 8 (eight) hours as needed for spasms. 11/19/15  Yes CHali Marry MD  lisinopril (PRINIVIL,ZESTRIL) 40 MG tablet TAKE 1 TABLET (40 MG TOTAL) BY MOUTH DAILY. 08/09/15  Yes CHali Marry MD  metoprolol (LOPRESSOR) 50 MG tablet Take 1.5 tablets (75 mg total) by mouth 2 (two) times daily. 08/21/15  Yes BLelon Perla MD  ofloxacin (FLOXIN) 0.3 % otic solution Place 5 drops into the right ear 2 (two) times daily.    Yes Historical Provider, MD  pantoprazole  (PROTONIX) 40 MG tablet TAKE 1 TABLET (40 MG TOTAL) BY MOUTH DAILY. Patient taking differently: TAKE 1 TABLET (40 MG TOTAL) BY MOUTH DAILY as needed for heartburn/acid reflux 12/09/15  Yes CHali Marry MD  polyethylene glycol powder (GLYCOLAX/MIRALAX) powder Take 1-2 capfuls daily in water Patient taking differently: Take 1 Container by mouth daily as needed for mild constipation or moderate constipation.  04/25/15  Yes Nyaira Hodgens P Trinitie Mcgirr, PA-C  traMADol (ULTRAM) 50 MG tablet Take 1 tablet (50 mg total) by mouth every 8 (eight) hours as needed. Patient taking differently: Take 50 mg by mouth every 8 (eight) hours as needed for moderate pain.  11/19/15  Yes CHali Marry MD    Current Facility-Administered Medications  Medication Dose Route Frequency Provider Last Rate Last Dose  .  0.9 %  sodium chloride infusion   Intravenous Continuous Bonnielee Haff, MD 75 mL/hr at 01/13/16 0400    . acetaminophen (TYLENOL) tablet 650 mg  650 mg Oral Q6H PRN Bonnielee Haff, MD       Or  . acetaminophen (TYLENOL) suppository 650 mg  650 mg Rectal Q6H PRN Bonnielee Haff, MD      . albuterol (PROVENTIL) (2.5 MG/3ML) 0.083% nebulizer solution 2.5 mg  2.5 mg Nebulization Q2H PRN Bonnielee Haff, MD      . hydrALAZINE (APRESOLINE) injection 10 mg  10 mg Intravenous Q6H PRN Bonnielee Haff, MD      . HYDROmorphone (DILAUDID) injection 0.5-1 mg  0.5-1 mg Intravenous Q3H PRN Bonnielee Haff, MD   1 mg at 01/13/16 0554  . metoprolol (LOPRESSOR) tablet 50 mg  50 mg Oral BID Bonnielee Haff, MD      . ondansetron [facility]) tablet 4 mg  4 mg Oral Q6H PRN Bonnielee Haff, MD       Or  . ondansetron (ZOFRAN) injection 4 mg  4 mg Intravenous Q6H PRN Bonnielee Haff, MD   4 mg at 01/13/16 0154  . piperacillin-tazobactam (ZOSYN) IVPB 3.375 g  3.375 g Intravenous 3 times per day Bonnielee Haff, MD   3.375 g at 01/13/16 0554  . sodium chloride flush (NS) 0.9 % injection 3 mL  3 mL Intravenous Q12H Bonnielee Haff, MD   3 mL  at 01/12/16 2200    Allergies as of 01/12/2016 - Review Complete 01/12/2016  Allergen Reaction Noted  . Hyoscyamine Other (See Comments) 11/19/2015  . Statins  07/13/2011  . Rofecoxib Rash     Family History  Problem Relation Age of Onset  . Stroke Father   . Diabetes Son     brittle diabetes    Social History   Social History  . Marital Status: Single    Spouse Name: N/A  . Number of Children: N/A  . Years of Education: N/A   Occupational History  . Not on file.   Social History Main Topics  . Smoking status: Former Smoker -- 40 years  . Smokeless tobacco: Never Used  . Alcohol Use: No  . Drug Use: No  . Sexual Activity: Not on file   Other Topics Concern  . Not on file   Social History Narrative    Review of Systems: Gen: Admits to chills and malaise. CV: Denies chest pain, angina, palpitations, syncope, orthopnea, PND, peripheral edema, and claudication. Resp: Denies dyspnea at rest, dyspnea with exercise, cough, sputum, wheezing, coughing up blood, and pleurisy. GI: Admits to nausea, vomiting, epigastric and right upper quadrant pain, and jaundice. GU : Denies urinary burning, blood in urine, urinary frequency, urinary hesitancy, nocturnal urination, and urinary incontinence. MS: Denies joint pain, limitation of movement, and swelling, stiffness, low back pain, extremity pain. Denies muscle weakness, cramps, atrophy.  Derm: Denies rash, itching, dry skin, hives, moles, warts, or unhealing ulcers.  Psych: Denies depression, anxiety, memory loss, suicidal ideation, hallucinations, paranoia, and confusion. Heme: Denies bruising, bleeding, and enlarged lymph nodes. Neuro:  Denies any headaches, dizziness, paresthesias. Endo:  Denies any problems with DM, thyroid, adrenal function.  Physical Exam: Vital signs in last 24 hours: Temp:  [97.4 F (36.3 C)-98.8 F (37.1 C)] 97.7 F (36.5 C) (02/13 0422) Pulse Rate:  [72-93] 72 (02/12 2100) Resp:  [20] 20 (02/12  2100) BP: (129-142)/(55-67) 142/67 mmHg (02/13 0422) SpO2:  [91 %-98 %] 91 % (02/13 0422) Weight:  [161 lb 6 oz (  73.2 kg)] 161 lb 6 oz (73.2 kg) (02/12 1529) Last BM Date: 01/12/16 General:   Alert,  Well-developed, jaundiced, appears comfortable. Head:  Normocephalic and atraumatic. Eyes:  Sclera icteric  Conjunctiva pink. Ears:  Normal auditory acuity. Nose:  No deformity, discharge,  or lesions. Mouth:  No deformity or lesions.   Neck:  Supple; no masses or thyromegaly. Lungs:  Clear throughout to auscultation.     Heart:  Regular rate and rhythm; 6-2/6 systolic murmur Abdomen:  Soft, nondistended, tender right upper quadrant and epigastric area without rebound or guarding, positive bowel sounds.   Rectal:  Deferred  Msk:  Symmetrical without gross deformities. . Pulses:  Normal pulses noted. Extremities:  Without clubbing or edema. Neurologic: Alert and  oriented x4;  grossly normal neurologically. Skin: Intact without significant lesions or rashes.. Psych: Alert and cooperative. Normal mood and affect.  Intake/Output from previous day: 02/12 0701 - 02/13 0700 In: 1091.3 [I.V.:1091.3] Out: 100 [Urine:100] Intake/Output this shift:    Lab Results:  Recent Labs  01/13/16 0609  WBC 10.6*  HGB 12.9  HCT 39.2  PLT 175   BMET  Recent Labs  01/12/16 1605 01/13/16 0609  NA 135 138  K 4.0 3.5  CL 99* 104  CO2 24 24  GLUCOSE 150* 90  BUN 13 16  CREATININE 0.69 0.69  CALCIUM 8.8* 8.3*   LFT  Recent Labs  01/13/16 0609  PROT 5.4*  ALBUMIN 2.9*  AST PENDING  ALT 523*  ALKPHOS 151*  BILITOT 5.0*   PT/INR  Recent Labs  01/12/16 1605  LABPROT 14.5  INR 1.11       PROCEDURES: ERCP 05/08/15:IMPRESSIONS: diffuse dilation of the biliary system without obvious stricture Etiology for upper abdominal pain uncertain RECOMMENDATIONS: Trial of hyomax 0.'375mg'$  bid for 5 days OV 3-4 weeks   IMPRESSION/PLAN: 81 year old female admitted with abdominal pain,  hyperbilirubinemia, and elevated LFTs. White blood count currently normal. Patient currently on Zosyn.(Patient was given Levaquin and cefepime at [facility] facility).  Bilirubin has climbed to 5 this morning, and patient is jaundiced with continued complaints of severe right upper quadrant pain. CT at outside facility suggestive of choledocholithiasis. MRCP was ordered, but in light of patient's worsening pain and climbing bilirubin, is felt to not be necessary and has been cancelled. We will plan on ERCP later today.The risks, benefits, and possible complications of the procedure, including  But not limited to bleeding, perforation, surgery, and the 5-10% risk for pancreatitis, were explained to the patient.  It was explained that  post ERCP pancreatitis which, while usually is mild, occasionally maybe severe and even life-threatening.  Patient's questions were answered. Procedure will be performed by Dr. Loletha Carrow.  History aortic valve replacement History abdominal aortic aneurysm History hypertension History abdominal aortic aneurysm CAD History hypercholesterolemia History carcinoma of lung T2 N0 moderately differentiated squamous cell CA  Caytlin Better P PA-C 01/13/2016,  Pager [phone]  Mon-Fri 8a-5p [phone] after 5p, weekends, holidays

## 2016-01-14 ENCOUNTER — Encounter (HOSPITAL_COMMUNITY): Payer: Self-pay | Admitting: Gastroenterology

## 2016-01-14 DIAGNOSIS — B9689 Other specified bacterial agents as the cause of diseases classified elsewhere: Secondary | ICD-10-CM | POA: Insufficient documentation

## 2016-01-14 DIAGNOSIS — K830 Cholangitis: Secondary | ICD-10-CM

## 2016-01-14 DIAGNOSIS — D72829 Elevated white blood cell count, unspecified: Secondary | ICD-10-CM

## 2016-01-14 DIAGNOSIS — K8309 Other cholangitis: Secondary | ICD-10-CM

## 2016-01-14 DIAGNOSIS — R1011 Right upper quadrant pain: Secondary | ICD-10-CM | POA: Insufficient documentation

## 2016-01-14 DIAGNOSIS — R7881 Bacteremia: Secondary | ICD-10-CM | POA: Insufficient documentation

## 2016-01-14 DIAGNOSIS — K8050 Calculus of bile duct without cholangitis or cholecystitis without obstruction: Secondary | ICD-10-CM

## 2016-01-14 LAB — COMPREHENSIVE METABOLIC PANEL
ALBUMIN: 2.7 g/dL — AB (ref 3.5–5.0)
ALT: 333 U/L — AB (ref 14–54)
AST: 162 U/L — AB (ref 15–41)
Alkaline Phosphatase: 117 U/L (ref 38–126)
Anion gap: 10 (ref 5–15)
BUN: 29 mg/dL — ABNORMAL HIGH (ref 6–20)
CHLORIDE: 102 mmol/L (ref 101–111)
CO2: 23 mmol/L (ref 22–32)
CREATININE: 1.16 mg/dL — AB (ref 0.44–1.00)
Calcium: 8.3 mg/dL — ABNORMAL LOW (ref 8.9–10.3)
GFR calc Af Amer: 51 mL/min — ABNORMAL LOW (ref >60)
GFR calc non Af Amer: 44 mL/min — ABNORMAL LOW (ref >60)
GLUCOSE: 102 mg/dL — AB (ref 65–99)
POTASSIUM: 3.7 mmol/L (ref 3.5–5.1)
SODIUM: 135 mmol/L (ref 135–145)
Total Bilirubin: 3.5 mg/dL — ABNORMAL HIGH (ref 0.3–1.2)
Total Protein: 5.3 g/dL — ABNORMAL LOW (ref 6.5–8.1)

## 2016-01-14 LAB — CBC
HCT: 36.8 % (ref 36.0–46.0)
Hemoglobin: 12.1 g/dL (ref 12.0–15.0)
MCH: 29.4 pg (ref 26.0–34.0)
MCHC: 32.9 g/dL (ref 30.0–36.0)
MCV: 89.3 fL (ref 78.0–100.0)
PLATELETS: 158 10*3/uL (ref 150–400)
RBC: 4.12 MIL/uL (ref 3.87–5.11)
RDW: 14.1 % (ref 11.5–15.5)
WBC: 20 10*3/uL — AB (ref 4.0–10.5)

## 2016-01-14 MED ORDER — MAGNESIUM HYDROXIDE 400 MG/5ML PO SUSP
30.0000 mL | Freq: Every day | ORAL | Status: DC | PRN
  Administered 2016-01-14: 30 mL via ORAL
  Filled 2016-01-14: qty 30

## 2016-01-14 MED ORDER — ZOLPIDEM TARTRATE 5 MG PO TABS: 5.0000 mg | ORAL_TABLET | Freq: Once | ORAL | Status: DC

## 2016-01-14 NOTE — Progress Notes (Signed)
Patient ID: Laurie [name], female   DOB: [birth year], 81 y.o.   MRN: [id number]    Progress Note   Subjective  Eating breakfast, no significant abdominal  pain but still feels bad /weak 1/2 BC growing gram neg bacilli On Zosyn   Objective   Vital signs in last 24 hours: Temp:  [97 F (36.1 C)-98.7 F (37.1 C)] 97.9 F (36.6 C) (02/14 0510) Pulse Rate:  [64-93] 64 (02/14 0510) Resp:  [13-20] 18 (02/14 0510) BP: (98-158)/(50-73) 158/73 mmHg (02/14 0510) SpO2:  [90 %-100 %] 98 % (02/14 0510) Last BM Date: 01/10/16 General:  elderly  white female in NAD Heart:  Regular rate and rhythm; no murmurs Lungs: Respirations even and unlabored, lungs CTA bilaterally Abdomen:  Soft, min tender and nondistended. Normal bowel sounds. Extremities:  Without edema. Neurologic:  Alert and oriented,  grossly normal neurologically. Psych:  Cooperative. Normal mood and affect.  Intake/Output from previous day: 02/13 0701 - 02/14 0700 In: 1758.3 [P.O.:120; I.V.:1638.3] Out: 2 [Urine:2] Intake/Output this shift:    Lab Results:  Recent Labs  01/13/16 0609 01/14/16 0543  WBC 10.6* 20.0*  HGB 12.9 12.1  HCT 39.2 36.8  PLT 175 158   BMET  Recent Labs  01/12/16 1605 01/13/16 0609 01/14/16 0543  NA 135 138 135  K 4.0 3.5 3.7  CL 99* 104 102  CO2 '24 24 23  '$ GLUCOSE 150* 90 102*  BUN 13 16 29*  CREATININE 0.69 0.69 1.16*  CALCIUM 8.8* 8.3* 8.3*   LFT  Recent Labs  01/14/16 0543  PROT 5.3*  ALBUMIN 2.7*  AST 162*  ALT 333*  ALKPHOS 117  BILITOT 3.5*   PT/INR  Recent Labs  01/12/16 1605  LABPROT 14.5  INR 1.11    Studies/Results: Dg Ercp Biliary & Pancreatic Ducts  01/13/2016  CLINICAL DATA:  Choledocholithiasis, post balloon sweeps EXAM: ERCP TECHNIQUE: Multiple spot images obtained with the fluoroscopic device and submitted for interpretation post-procedure. FLUOROSCOPY TIME:  7 min 2 sec COMPARISON:  CT 08/15/2015 FINDINGS: A series of intraoperative  fluoroscopic spot images document endoscopic cannulation and opacification of the CBD. Multiple filling defects are evident. Subsequent images document balloon passage through the CBD. There is incomplete opacification of the intrahepatic bile ducts, appearing mildly distended centrally. IMPRESSION: 1. Choledocholithiasis, with endoscopic balloon sweep. These images were submitted for radiologic interpretation only. Please see the procedural report for the amount of contrast and the fluoroscopy time utilized. Electronically Signed   By: Lucrezia Europe M.D.   On: 01/13/2016 13:27       Assessment / Plan:    #1  81 yo female stable s/p ERCP and stone extraction yesterday-  WBC up to 20 today, LFT's improving Prob Cholangitis  With 1/2 BC +. Discussed with hospitalist , will leave on Zosyn for now, keep in hospital  at least another 24 hours and f/u on culture/sensitivities  Principal Problem:   Transaminitis Active Problems:   Essential hypertension, benign   S/P lobectomy of lung   Abdominal aortic aneurysm (HCC)   Choledocholithiasis   RUQ abdominal pain   Jaundice   Pulmonary emphysema (HCC)   Aortic valve disease   Dilated bile duct     LOS: 2 days   Laurie [name]  01/14/2016, 9:16 AM

## 2016-01-14 NOTE — Progress Notes (Signed)
Patient ID: Laurie [name], female   DOB: 1935/04/14, 81 y.o.   MRN: [id number] [facility] PROGRESS NOTE  DANNIEL GRENZ QAS:[id number] DOB: 1935/11/04 DOA: 01/12/2016 PCP: Beatrice Lecher, MD  Brief narrative:    81 y.o. female with a past medical history of hypertension, abdominal aortic aneurysm, aortic valve replacement who is status post cholecystectomy in 2015. Patient presented to her nurse will [facility] with reports of abdominal pain in upper abdomen. She was noted to have elevated LFTs and CT scan showed biliary ductal dilation with concern for obstructing stone in she was transferred to [facility]. She is status post ERCP 01/13/2016. She is on Zosyn for acute cholangitis as well as for one of the blood cultures obtained in [place] which is growing gram-negative rods.   Assessment/Plan:    Principal problem: Right upper quadrant abdominal pain with elevated liver function enzymes - ERCP consistent with choledocholithiasis - Appreciate the GI recommendations - Advance diet to regular - She is on antibiotics which would cover for possible acute cholangitis  Active problems:  Gram-negative bacteremia / leukocytosis - One of the blood cultures from [facility] in [place] was significant for gram-negative rods. - Blood culture so far on this admission are pending - Continue Zosyn  Essential hypertension - Continue metoprolol 50 mg twice daily  Acute kidney injury - Likely from acute infection - Very mild elevation in creatinine, 1.16, likely prerenal - Follow-up BMP tomorrow morning   DVT Prophylaxis  - SCDs bilaterally   Code Status: Full.  Family Communication:  plan of care discussed with the patient Disposition Plan: Home once final blood culture results are back and if leukocytosis is improving  IV access:  Peripheral IV  Procedures and diagnostic studies:    Dg Ercp Biliary & Pancreatic Ducts 01/13/2016   1.  Choledocholithiasis, with endoscopic balloon sweep. These images were submitted for radiologic interpretation only. Please see the procedural report for the amount of contrast and the fluoroscopy time utilized.  Medical Consultants:  Gastroenterology  IAnti-Infectives:   Erline Hau, MD  [facility] Pager [phone]  Time spent in minutes: 25 minutes  If 7PM-7AM, please contact night-coverage [email or web address] Password [facility] 01/14/2016, 11:49 AM   LOS: 2 days    HPI/Subjective: No acute overnight events. Patient reports feeling better this morning.  Objective: Filed Vitals:   01/13/16 1624 01/13/16 2016 01/14/16 0510 01/14/16 0955  BP: 118/61 132/63 158/73 131/56  Pulse: 65 68 64 81  Temp:  98.7 F (37.1 C) 97.9 F (36.6 C) 98 F (36.7 C)  TempSrc:  Oral Oral Oral  Resp:  '18 18 20  '$ Height:      Weight:      SpO2:  95% 98% 93%    Intake/Output Summary (Last 24 hours) at 01/14/16 1149 Last data filed at 01/14/16 0900  Gross per 24 hour  Intake 1758.33 ml  Output    252 ml  Net 1506.33 ml    Exam:   General:  Pt is alert, follows commands appropriately, not in acute distress  Cardiovascular: Regular rate and rhythm, S1/S2 (+)  Respiratory: Clear to auscultation bilaterally, no wheezing, no crackles, no rhonchi  Abdomen: Soft, non tender, non distended, bowel sounds present  Extremities: No edema, pulses DP and PT palpable bilaterally  Neuro: Grossly nonfocal  Data Reviewed: Basic Metabolic Panel:  Recent Labs Lab 01/12/16 1605 01/13/16 0609 01/14/16 0543  NA 135 138 135  K 4.0 3.5 3.7  CL 99* 104 102  CO2 '24 24 23  '$ GLUCOSE 150* 90 102*  BUN 13 16 29*  CREATININE 0.69 0.69 1.16*  CALCIUM 8.8* 8.3* 8.3*   Liver Function Tests:  Recent Labs Lab 01/13/16 0609 01/14/16 0543  AST 305* 162*  ALT 523* 333*  ALKPHOS 151* 117  BILITOT 5.0* 3.5*  PROT 5.4* 5.3*  ALBUMIN 2.9* 2.7*   No results for input(s): LIPASE, AMYLASE in the  last 168 hours. No results for input(s): AMMONIA in the last 168 hours. CBC:  Recent Labs Lab 01/13/16 0609 01/14/16 0543  WBC 10.6* 20.0*  HGB 12.9 12.1  HCT 39.2 36.8  MCV 89.3 89.3  PLT 175 158   Cardiac Enzymes: No results for input(s): CKTOTAL, CKMB, CKMBINDEX, TROPONINI in the last 168 hours. BNP: Invalid input(s): POCBNP CBG: No results for input(s): GLUCAP in the last 168 hours.  Recent Results (from the past 240 hour(s))  Culture, blood (Routine X 2) w Reflex to ID Panel     Status: None (Preliminary result)   Collection Time: 01/12/16  4:05 PM  Result Value Ref Range Status   Specimen Description BLOOD BLOOD LEFT HAND  Final   Special Requests BOTTLES DRAWN AEROBIC AND ANAEROBIC 10CC EA  Final   Culture   Final    NO GROWTH < 24 HOURS Performed at [facility]    Report Status PENDING  Incomplete  Culture, blood (Routine X 2) w Reflex to ID Panel     Status: None (Preliminary result)   Collection Time: 01/12/16  4:15 PM  Result Value Ref Range Status   Specimen Description BLOOD BLOOD RIGHT HAND  Final   Special Requests   Final    BOTTLES DRAWN AEROBIC AND ANAEROBIC 10CC EA Performed at [facility]    Culture PENDING  Incomplete   Report Status PENDING  Incomplete     Scheduled Meds: . metoprolol  50 mg Oral BID  . piperacillin-tazobactam (ZOSYN)  IV  3.375 g Intravenous 3 times per day  . sodium chloride flush  3 mL Intravenous Q12H  . zolpidem  5 mg Oral Once

## 2016-01-14 NOTE — Progress Notes (Signed)
Patient having bursts of SVT this am.  Writer notified by CMT, Renee.  Patient asymptomatic during episodes and vital signs stable.  Dr. Charlies Silvers made aware.  Notified by CMT, Renee at 1230 pm patient now in atrial fibrillation with heart rate sustaining between 140s and 160s.  Patient given lopressor as scheduled this am with her medications. Patient started complaining of heart burn and stated " I am having heart burn, it feels like burning in my chest."  Dr. Charlies Silvers notified via text page about heart burn, heart rate now a-fib in 140s-160s.  Dr. Charlies Silvers called and gave orders to give milk of magnesia and to discontinue cardiac monitor.  Patient has history of a-fib.  EKG obtained and shows atrial fibrillation with rapid ventricular response.  Dr. Charlies Silvers on floor and writer informed her of EKG results and asked if she wanted to still discontinue cardiac monitoring.  Dr. Charlies Silvers said yes since patient had a history of a-fib and asked if she received her lopressor this am and writer told her yes she was given her po lopressor as scheduled for this am.  Cardiac monitor discontinued by NT, Servando Salina and patient given milk of magnesia by Probation officer.  Will continue to monitor patient.

## 2016-01-14 NOTE — Evaluation (Signed)
Physical Therapy Evaluation Patient Details Name: JARRETT CHICOINE MRN: [id number] DOB: [birth year] Today's Date: 01/14/2016   History of Present Illness  81 yo female admitted with transaminitis, acute cholangitis. S/P ERCP 2/13. Hx of cholecystectomy 2015, lung cancer, AAA, HTN.   Clinical Impression  Checked with RN prior to session-she was okay with PT working with pt-stated pt wants to get up. Vitals- start of session: HR 143 bpm, O2 95% RA at rest; During session: HR 160s-170s bpm, O2 sats 87% RA; End of session: HR 141 bpm, O2 93%. Pt did c/o dyspnea and "heart racing.". Discussed d/c plan-pt plans to return home. Offered HHPT follow up-pt declined. Encouraged pt to use walker for stability until she returns to her baseline.     Follow Up Recommendations  (pt declined HHPT follow up)    Equipment Recommendations  None recommended by PT (pt states she has access to walker at home)    Recommendations for Other Services       Precautions / Restrictions Precautions Precautions: Fall Precaution Comments: monitor HR , sats Restrictions Weight Bearing Restrictions: No      Mobility  Bed Mobility Overal bed mobility: Modified Independent                Transfers Overall transfer level: Needs assistance   Transfers: Sit to/from Stand Sit to Stand: Min assist         General transfer comment: Assist to steady.   Ambulation/Gait Ambulation/Gait assistance: Min assist Ambulation Distance (Feet): 140 Feet Assistive device: 1 person hand held assist Gait Pattern/deviations: Step-through pattern;Drifts right/left;Staggering right;Staggering left;Decreased stride length     General Gait Details: unsteady during ambulation requiring Min assist to stabilize throughout distance. HR as high as 170s during ambulation, O2 sats 87% on RA.   Stairs            Wheelchair Mobility    Modified Rankin (Stroke Patients Only)       Balance Overall balance  assessment: Needs assistance         Standing balance support: During functional activity Standing balance-Leahy Scale: Fair                               Pertinent Vitals/Pain Pain Assessment: 0-10 Pain Score: 4  Pain Location: abdomen,  Pain Descriptors / Indicators: Sore    Home Living Family/patient expects to be discharged to:: Private residence Living Arrangements: Children   Type of Home: House Home Access: Stairs to enter   Technical brewer of Steps: 2 Home Layout: One level Home Equipment: None      Prior Function Level of Independence: Independent               Hand Dominance        Extremity/Trunk Assessment   Upper Extremity Assessment: Generalized weakness           Lower Extremity Assessment: Generalized weakness      Cervical / Trunk Assessment: Normal  Communication   Communication: No difficulties  Cognition Arousal/Alertness: Awake/alert Behavior During Therapy: WFL for tasks assessed/performed Overall Cognitive Status: Within Functional Limits for tasks assessed                      General Comments      Exercises        Assessment/Plan    PT Assessment Patient needs continued PT services  PT Diagnosis Difficulty walking;Generalized weakness   PT  Problem List Decreased activity tolerance;Decreased balance;Decreased mobility;Cardiopulmonary status limiting activity  PT Treatment Interventions Functional mobility training;Gait training;Therapeutic activities;Patient/family education;Balance training;Therapeutic exercise   PT Goals (Current goals can be found in the Care Plan section) Acute Rehab PT Goals Patient Stated Goal: home. regain PLOF PT Goal Formulation: With patient Time For Goal Achievement: 01/28/16 Potential to Achieve Goals: Good    Frequency Min 3X/week   Barriers to discharge        Co-evaluation               End of Session Equipment Utilized During Treatment:  Gait belt Activity Tolerance: Patient limited by fatigue Patient left: in chair;with call bell/phone within reach;with chair alarm set           Time: 1435-1452 PT Time Calculation (min) (ACUTE ONLY): 17 min   Charges:   PT Evaluation $PT Eval Moderate Complexity: 1 Procedure     PT G Codes:        Weston Anna, MPT Pager: [phone]

## 2016-01-15 DIAGNOSIS — I10 Essential (primary) hypertension: Secondary | ICD-10-CM

## 2016-01-15 DIAGNOSIS — N179 Acute kidney failure, unspecified: Secondary | ICD-10-CM

## 2016-01-15 DIAGNOSIS — R740 Nonspecific elevation of levels of transaminase and lactic acid dehydrogenase [LDH]: Secondary | ICD-10-CM

## 2016-01-15 DIAGNOSIS — B370 Candidal stomatitis: Secondary | ICD-10-CM

## 2016-01-15 LAB — COMPREHENSIVE METABOLIC PANEL
ALBUMIN: 2.8 g/dL — AB (ref 3.5–5.0)
ALT: 226 U/L — ABNORMAL HIGH (ref 14–54)
ANION GAP: 10 (ref 5–15)
AST: 72 U/L — AB (ref 15–41)
Alkaline Phosphatase: 159 U/L — ABNORMAL HIGH (ref 38–126)
BUN: 24 mg/dL — AB (ref 6–20)
CHLORIDE: 100 mmol/L — AB (ref 101–111)
CO2: 24 mmol/L (ref 22–32)
Calcium: 8.5 mg/dL — ABNORMAL LOW (ref 8.9–10.3)
Creatinine, Ser: 0.84 mg/dL (ref 0.44–1.00)
GFR calc Af Amer: >60 mL/min (ref >60)
Glucose, Bld: 112 mg/dL — ABNORMAL HIGH (ref 65–99)
POTASSIUM: 3.8 mmol/L (ref 3.5–5.1)
Sodium: 134 mmol/L — ABNORMAL LOW (ref 135–145)
Total Bilirubin: 2.5 mg/dL — ABNORMAL HIGH (ref 0.3–1.2)
Total Protein: 5.4 g/dL — ABNORMAL LOW (ref 6.5–8.1)

## 2016-01-15 LAB — CBC
HEMATOCRIT: 37.6 % (ref 36.0–46.0)
HEMOGLOBIN: 12.3 g/dL (ref 12.0–15.0)
MCH: 28.7 pg (ref 26.0–34.0)
MCHC: 32.7 g/dL (ref 30.0–36.0)
MCV: 87.9 fL (ref 78.0–100.0)
Platelets: 173 10*3/uL (ref 150–400)
RBC: 4.28 MIL/uL (ref 3.87–5.11)
RDW: 14 % (ref 11.5–15.5)
WBC: 15 10*3/uL — AB (ref 4.0–10.5)

## 2016-01-15 LAB — LIPASE, BLOOD: Lipase: 22 U/L (ref 11–51)

## 2016-01-15 MED ORDER — NYSTATIN 100000 UNIT/ML MT SUSP
5.0000 mL | Freq: Four times a day (QID) | OROMUCOSAL | Status: DC
  Administered 2016-01-15 – 2016-01-16 (×4): 500000 [IU] via ORAL
  Filled 2016-01-15 (×7): qty 5

## 2016-01-15 MED ORDER — CIPROFLOXACIN HCL 500 MG PO TABS
500.0000 mg | ORAL_TABLET | Freq: Two times a day (BID) | ORAL | Status: DC
  Administered 2016-01-15 – 2016-01-16 (×2): 500 mg via ORAL
  Filled 2016-01-15 (×2): qty 1

## 2016-01-15 MED ORDER — FLUCONAZOLE IN SODIUM CHLORIDE 100-0.9 MG/50ML-% IV SOLN
100.0000 mg | INTRAVENOUS | Status: DC
  Administered 2016-01-15: 100 mg via INTRAVENOUS
  Filled 2016-01-15 (×2): qty 50

## 2016-01-15 MED ORDER — DEXTROSE-NACL 2.5-0.45 % IV SOLN
INTRAVENOUS | Status: DC
  Filled 2016-01-15: qty 1000

## 2016-01-15 MED ORDER — PANTOPRAZOLE SODIUM 40 MG IV SOLR
40.0000 mg | INTRAVENOUS | Status: DC
Start: 2016-01-15 — End: 2016-01-16
  Administered 2016-01-15: 40 mg via INTRAVENOUS
  Filled 2016-01-15 (×2): qty 40

## 2016-01-15 NOTE — Care Management Important Message (Signed)
Important Message  Patient Details  Name: JESSICAH CROLL MRN: [id number] Date of Birth: [birth year]   Medicare Important Message Given:  Yes    Camillo Flaming 01/15/2016, 11:29 AMImportant Message  Patient Details  Name: SOUNDRA LAMPLEY MRN: [id number] Date of Birth: [birth year]   Medicare Important Message Given:  Yes    Camillo Flaming 01/15/2016, 11:29 AM

## 2016-01-15 NOTE — Progress Notes (Signed)
Patient ID: Laurie [name], female   DOB: Apr 18, 1935, 81 y.o.   MRN: [id number]    Progress Note   Subjective   Pt not feeling well today- abdomen still "sore", c/o weakness, mouth ,gums sore and teeth hurting   Objective   Vital signs in last 24 hours: Temp:  [97.4 F (36.3 C)-98.6 F (37 C)] 98.1 F (36.7 C) (02/15 0643) Pulse Rate:  [81-154] 154 (02/15 0643) Resp:  [20] 20 (02/15 0643) BP: (118-131)/(56-95) 128/95 mmHg (02/15 0643) SpO2:  [93 %-94 %] 93 % (02/15 0643) Last BM Date: 01/12/16 General: elderly   white female in NAD Heart:  Tachy irregular rhythm; no murmurs- pulse 130 Lungs: Respirations even and unlabored, lungs CTA bilaterally Abdomen:  Soft, mildly tender epigastrium /RUQ,and nondistended. Normal bowel sounds. Extremities:  Without edema. Neurologic:  Alert and oriented,  grossly normal neurologically. Psych:  Cooperative. Normal mood and affect.  Intake/Output from previous day: 02/14 0701 - 02/15 0700 In: 50 [IV Piggyback:50] Out: 550 [Urine:550] Intake/Output this shift:    Lab Results:  Recent Labs  01/13/16 0609 01/14/16 0543  WBC 10.6* 20.0*  HGB 12.9 12.1  HCT 39.2 36.8  PLT 175 158   BMET  Recent Labs  01/13/16 0609 01/14/16 0543 01/15/16 0508  NA 138 135 134*  K 3.5 3.7 3.8  CL 104 102 100*  CO2 '24 23 24  '$ GLUCOSE 90 102* 112*  BUN 16 29* 24*  CREATININE 0.69 1.16* 0.84  CALCIUM 8.3* 8.3* 8.5*   LFT  Recent Labs  01/15/16 0508  PROT 5.4*  ALBUMIN 2.8*  AST 72*  ALT 226*  ALKPHOS 159*  BILITOT 2.5*   PT/INR  Recent Labs  01/12/16 1605  LABPROT 14.5  INR 1.11    Studies/Results: Dg Ercp Biliary & Pancreatic Ducts  01/13/2016  CLINICAL DATA:  Choledocholithiasis, post balloon sweeps EXAM: ERCP TECHNIQUE: Multiple spot images obtained with the fluoroscopic device and submitted for interpretation post-procedure. FLUOROSCOPY TIME:  7 min 2 sec COMPARISON:  CT 08/15/2015 FINDINGS: A series of intraoperative  fluoroscopic spot images document endoscopic cannulation and opacification of the CBD. Multiple filling defects are evident. Subsequent images document balloon passage through the CBD. There is incomplete opacification of the intrahepatic bile ducts, appearing mildly distended centrally. IMPRESSION: 1. Choledocholithiasis, with endoscopic balloon sweep. These images were submitted for radiologic interpretation only. Please see the procedural report for the amount of contrast and the fluoroscopy time utilized. Electronically Signed   By: Lucrezia Europe M.D.   On: 01/13/2016 13:27       Assessment / Plan:    #1 81 yo female  With cholangitis - day #3 Zosyn- final ID on cultures still pending/gram neg rod Stable s/p ERCP and stone extraction 2/13 LFT's continue to trend down ? Mild pancreatitis- check lipase, cbc this am Doesn't look as good today, not eating much, in rapid afib Restart fluids   #2 Tachycardia- pulse 130 this am - nurse reports pt was in AFib yesterday , confirmed by EKG- defer to medicine ,but needs treated, also pt says not on her usual dose of metoprolol here- takes 75 mg BID at home   Concerned she is afib secondary to bacteremia/sepsis   #3 oral thrush- start mycostatin oral suspension #4 COPD    Principal Problem:   Transaminitis Active Problems:   Essential hypertension, benign   S/P lobectomy of lung   Abdominal aortic aneurysm (HCC)   Choledocholithiasis   RUQ abdominal pain   Jaundice  Pulmonary emphysema (HCC)   Aortic valve disease   Dilated bile duct   Bacterial cholangitis   Leukocytosis   Right upper quadrant pain   Gram-negative bacteremia ([place])     LOS: 3 days   Amy Esterwood  01/15/2016, 9:40 AM

## 2016-01-15 NOTE — Progress Notes (Signed)
Patient ID: Laurie [name], female   DOB: 11/09/1935, 81 y.o.   MRN: [id number] [facility] PROGRESS NOTE  AUDRIANNA DRISKILL TMH:[id number] DOB: 09/21/1935 DOA: 01/12/2016 PCP: Beatrice Lecher, MD  Brief narrative:    81 y.o. female with a past medical history of hypertension, abdominal aortic aneurysm, aortic valve replacement who is status post cholecystectomy in 2015. Patient presented to her nurse will [facility] with reports of abdominal pain in upper abdomen. She was noted to have elevated LFTs and CT scan showed biliary ductal dilation with concern for obstructing stone in she was transferred to [facility]. She is status post ERCP 01/13/2016. She is on Zosyn for acute cholangitis as well as for one of the blood cultures obtained in [place] which is growing gram-negative rods.   Assessment/Plan:    Principal problem: Right upper quadrant abdominal pain with elevated liver function enzymes - ERCP consistent with choledocholithiasis - Continue zosyn - Tolerates regular diet   Active problems:  Oral thrush - Started nystatin swish and swallow and fluconazole 100 mg daily   Gram-negative bacteremia / leukocytosis - One of the blood cultures from [facility] in [place] was significant for gram-negative rods.  - Blood culture still pending (1 of the sets), another set showed no growth  - Continue Zosyn  Essential hypertension - Continue metoprolol 50 mg twice daily  Acute kidney injury - Likely from acute infection - Cr normalized with fluids    DVT Prophylaxis  - SCDs bilaterally in hospital    Code Status: Full.  Family Communication:  plan of care discussed with the patient Disposition Plan: Home 2/16 if  Blood culture results back.   IV access:  Peripheral IV  Procedures and diagnostic studies:    Dg Ercp Biliary & Pancreatic Ducts 01/13/2016   1. Choledocholithiasis, with endoscopic balloon sweep. These images were submitted  for radiologic interpretation only. Please see the procedural report for the amount of contrast and the fluoroscopy time utilized.  Medical Consultants:  Gastroenterology  IAnti-Infectives:   Erline Hau, MD  [facility] Pager [phone]  Time spent in minutes: 25 minutes  If 7PM-7AM, please contact night-coverage [email or web address] Password [facility] 01/15/2016, 6:39 AM   LOS: 3 days    HPI/Subjective: No acute overnight events. Patient wants to go home.   Objective: Filed Vitals:   01/14/16 0510 01/14/16 0955 01/14/16 1316 01/14/16 2302  BP: 158/73 131/56 118/87 130/94  Pulse: 64 81 147 147  Temp: 97.9 F (36.6 C) 98 F (36.7 C) 98.6 F (37 C) 97.4 F (36.3 C)  TempSrc: Oral Oral Oral Oral  Resp: '18 20 20 20  '$ Height:      Weight:      SpO2: 98% 93% 93% 94%    Intake/Output Summary (Last 24 hours) at 01/15/16 7408 Last data filed at 01/14/16 2303  Gross per 24 hour  Intake     50 ml  Output    550 ml  Net   -500 ml    Exam:   General:  Pt is not in acute distress  Cardiovascular: RRR, S1/S2 appreciated   Respiratory: No wheezing, no crackles, no rhonchi  Abdomen: (+) BS, non tender   Extremities: No swelling, pulses palpable bilaterally  Neuro: Nonfocal  Data Reviewed: Basic Metabolic Panel:  Recent Labs Lab 01/12/16 1605 01/13/16 0609 01/14/16 0543 01/15/16 0508  NA 135 138 135 134*  K 4.0 3.5 3.7 3.8  CL 99* 104 102 100*  CO2 24 24 23  24  GLUCOSE 150* 90 102* 112*  BUN 13 16 29* 24*  CREATININE 0.69 0.69 1.16* 0.84  CALCIUM 8.8* 8.3* 8.3* 8.5*   Liver Function Tests:  Recent Labs Lab 01/13/16 0609 01/14/16 0543 01/15/16 0508  AST 305* 162* 72*  ALT 523* 333* 226*  ALKPHOS 151* 117 159*  BILITOT 5.0* 3.5* 2.5*  PROT 5.4* 5.3* 5.4*  ALBUMIN 2.9* 2.7* 2.8*   No results for input(s): LIPASE, AMYLASE in the last 168 hours. No results for input(s): AMMONIA in the last 168 hours. CBC:  Recent Labs Lab 01/13/16 0609  01/14/16 0543  WBC 10.6* 20.0*  HGB 12.9 12.1  HCT 39.2 36.8  MCV 89.3 89.3  PLT 175 158   Cardiac Enzymes: No results for input(s): CKTOTAL, CKMB, CKMBINDEX, TROPONINI in the last 168 hours. BNP: Invalid input(s): POCBNP CBG: No results for input(s): GLUCAP in the last 168 hours.  Recent Results (from the past 240 hour(s))  Culture, blood (Routine X 2) w Reflex to ID Panel     Status: None (Preliminary result)   Collection Time: 01/12/16  4:05 PM  Result Value Ref Range Status   Specimen Description BLOOD BLOOD LEFT HAND  Final   Special Requests BOTTLES DRAWN AEROBIC AND ANAEROBIC 10CC EA  Final   Culture   Final    NO GROWTH 2 DAYS Performed at [facility]    Report Status PENDING  Incomplete  Culture, blood (Routine X 2) w Reflex to ID Panel     Status: None (Preliminary result)   Collection Time: 01/12/16  4:15 PM  Result Value Ref Range Status   Specimen Description BLOOD BLOOD RIGHT HAND  Final   Special Requests   Final    BOTTLES DRAWN AEROBIC AND ANAEROBIC 10CC EA Performed at [facility]    Culture PENDING  Incomplete   Report Status PENDING  Incomplete     Scheduled Meds: . metoprolol  50 mg Oral BID  . piperacillin-tazobactam (ZOSYN)  IV  3.375 g Intravenous 3 times per day  . sodium chloride flush  3 mL Intravenous Q12H  . zolpidem  5 mg Oral Once

## 2016-01-16 MED ORDER — CIPROFLOXACIN HCL 500 MG PO TABS: 500.0000 mg | ORAL_TABLET | Freq: Two times a day (BID) | ORAL | Status: DC

## 2016-01-16 MED ORDER — FLUCONAZOLE 100 MG PO TABS: 100.0000 mg | ORAL_TABLET | Freq: Every day | ORAL | Status: DC

## 2016-01-16 MED ORDER — NYSTATIN 100000 UNIT/ML MT SUSP: 5.0000 mL | Freq: Four times a day (QID) | OROMUCOSAL | Status: DC

## 2016-01-16 NOTE — Discharge Summary (Signed)
Physician Discharge Summary  Laurie [name] JAS:[id number] DOB: [birth year] DOA: 01/12/2016  PCP: Beatrice Lecher, MD  Admit date: 01/12/2016 Discharge date: 01/16/2016  Recommendations for Outpatient Follow-up:  1. Continue fluconazole for 7 days. 2. Continue nystatin swish and swallow until thrush resolves.  3. Continue cipro for 10 days on discharge  Discharge Diagnoses:  Principal Problem:   Transaminitis Active Problems:   Essential hypertension, benign   S/P lobectomy of lung   Abdominal aortic aneurysm (HCC)   Choledocholithiasis   RUQ abdominal pain   Jaundice   Pulmonary emphysema (HCC)   Aortic valve disease   Dilated bile duct   Bacterial cholangitis   Leukocytosis   Right upper quadrant pain   Gram-negative bacteremia ([place])    Discharge Condition: stable   Diet recommendation: as tolerated   History of present illness:  81 y.o. female with a past medical history of hypertension, abdominal aortic aneurysm, aortic valve replacement who is status post cholecystectomy in 2015. Patient presented to her nurse will [facility] with reports of abdominal pain in upper abdomen. She was noted to have elevated LFTs and CT scan showed biliary ductal dilation with concern for obstructing stone in she was transferred to [facility]. She is status post ERCP 01/13/2016. She is on Zosyn for acute cholangitis as well as for one of the blood cultures obtained in [place] which is growing gram-negative rods.  Hospital Course:   Assessment/Plan:    Principal problem: Right upper quadrant abdominal pain with elevated liver function enzymes - ERCP consistent with choledocholithiasis - Patient was on Zosyn and we'll change to Cipro for 10 days on discharge - Tolerates regular diet   Active problems:  Oral thrush - Started nystatin swish and swallow and fluconazole 100 mg daily and she will continue this on discharge for at least one week or until oral  thrush resolves  Gram-negative bacteremia / leukocytosis - One of the blood cultures from [facility] in [place] was significant for gram-negative rods.  - Blood cultures on this admission are showing no growth so far  - She will continue Cipro for 10 days on discharge  Essential hypertension - Continue metoprolol 50 mg twice daily - Continue lisinopril 40 mg daily   Acute kidney injury - Likely from acute infection - Cr normalized with fluids    DVT Prophylaxis  - SCDs bilaterally in hospital    Code Status: Full.  Family Communication: plan of care discussed with the patient Disposition Plan: Home 2/16 if Blood culture results back.   IV access:  Peripheral IV  Procedures and diagnostic studies:   Dg Ercp Biliary & Pancreatic Ducts 01/13/2016 1. Choledocholithiasis, with endoscopic balloon sweep. These images were submitted for radiologic interpretation only. Please see the procedural report for the amount of contrast and the fluoroscopy time utilized.  Medical Consultants:  Gastroenterology  IAnti-Infectives:   Lajean Silvius   Signed:  Leisa Lenz, MD  [facility] 01/16/2016, 10:53 AM  Pager #: [phone]  Time spent in minutes: more than 30 minutes   Discharge Exam: Filed Vitals:   01/15/16 2216 01/16/16 0640  BP: 189/83 178/77  Pulse: 76 68  Temp: 98.3 F (36.8 C) 97.9 F (36.6 C)  Resp: 16 18   Filed Vitals:   01/15/16 0643 01/15/16 1341 01/15/16 2216 01/16/16 0640  BP: 128/95 165/87 189/83 178/77  Pulse: 154 70 76 68  Temp: 98.1 F (36.7 C) 97.6 F (36.4 C) 98.3 F (36.8 C) 97.9 F (36.6 C)  TempSrc: Oral  Oral Oral Oral  Resp: '20 16 16 18  '$ Height:      Weight:      SpO2: 93% 97% 93% 95%    General: Pt is alert, follows commands appropriately, not in acute distress Cardiovascular: Regular rate and rhythm, S1/S2 +, no murmurs Respiratory: Clear to auscultation bilaterally, no wheezing, no crackles, no  rhonchi Abdominal: Soft, non tender, non distended, bowel sounds +, no guarding Extremities: no edema, no cyanosis, pulses palpable bilaterally DP and PT Neuro: Grossly nonfocal  Discharge Instructions  Discharge Instructions    Call MD for:  difficulty breathing, headache or visual disturbances    Complete by:  As directed      Call MD for:  persistant nausea and vomiting    Complete by:  As directed      Call MD for:  severe uncontrolled pain    Complete by:  As directed      Diet - low sodium heart healthy    Complete by:  As directed      Discharge instructions    Complete by:  As directed   1. Continue fluconazole for 7 days. 2. Continue nystatin swish and swallow until thrush resolves.  3. Continue cipro for 10 days on discharge     Increase activity slowly    Complete by:  As directed             Medication List    STOP taking these medications        amoxicillin 500 MG capsule  Commonly known as:  AMOXIL      TAKE these medications        acetaminophen 500 MG tablet  Commonly known as:  TYLENOL  Take 500 mg by mouth every 6 (six) hours as needed for moderate pain, fever or headache. Pain     ALPRAZolam 0.5 MG tablet  Commonly known as:  XANAX  TAKE 1 TABLET BY MOUTH TWICE A DAY     AMBULATORY NON FORMULARY MEDICATION  Medication Name: ABI of both lower extremities. Please schedule at [facility].     aspirin 81 MG tablet  Take 81 mg by mouth daily.     ciprofloxacin 500 MG tablet  Commonly known as:  CIPRO  Take 1 tablet (500 mg total) by mouth 2 (two) times daily.     dicyclomine 20 MG tablet  Commonly known as:  BENTYL  Take 0.5-1 tablets (10-20 mg total) by mouth every 8 (eight) hours as needed for spasms.     fluconazole 100 MG tablet  Commonly known as:  DIFLUCAN  Take 1 tablet (100 mg total) by mouth daily.     lisinopril 40 MG tablet  Commonly known as:  PRINIVIL,ZESTRIL  TAKE 1 TABLET (40 MG TOTAL) BY MOUTH DAILY.     metoprolol 50  MG tablet  Commonly known as:  LOPRESSOR  Take 1.5 tablets (75 mg total) by mouth 2 (two) times daily.     nystatin 100000 UNIT/ML suspension  Commonly known as:  MYCOSTATIN  Take 5 mLs (500,000 Units total) by mouth 4 (four) times daily.     ofloxacin 0.3 % otic solution  Commonly known as:  FLOXIN  Place 5 drops into the right ear 2 (two) times daily.     pantoprazole 40 MG tablet  Commonly known as:  PROTONIX  TAKE 1 TABLET (40 MG TOTAL) BY MOUTH DAILY.     polyethylene glycol powder powder  Commonly known as:  GLYCOLAX/MIRALAX  Take 1-2  capfuls daily in water     traMADol 50 MG tablet  Commonly known as:  ULTRAM  Take 1 tablet (50 mg total) by mouth every 8 (eight) hours as needed.           Follow-up Information    Follow up with Doran Stabler, MD On 02/13/2016.   Specialty:  Gastroenterology   Why:  appt at 10:45, be there at 10:30 please   Contact information:   [address] [place] [address] [phone]       Follow up with [name], MD On 02/13/2016.   Specialty:  Gastroenterology   Why:  at 10:45 am   Contact information:   [place] [address] [phone]       Follow up with METHENEY,CATHERINE, MD. Schedule an appointment as soon as possible for a visit in 1 week.   Specialty:  Family Medicine   Why:  Follow up appt after recent hospitalization   Contact information:   [address] [place] [address] [phone]        The results of significant diagnostics from this hospitalization (including imaging, microbiology, ancillary and laboratory) are listed below for reference.    Significant Diagnostic Studies: Dg Ercp Biliary & Pancreatic Ducts  01/13/2016  CLINICAL DATA:  Choledocholithiasis, post balloon sweeps EXAM: ERCP TECHNIQUE: Multiple spot images obtained with the fluoroscopic device and submitted for interpretation post-procedure. FLUOROSCOPY TIME:  7 min 2 sec  COMPARISON:  CT 08/15/2015 FINDINGS: A series of intraoperative fluoroscopic spot images document endoscopic cannulation and opacification of the CBD. Multiple filling defects are evident. Subsequent images document balloon passage through the CBD. There is incomplete opacification of the intrahepatic bile ducts, appearing mildly distended centrally. IMPRESSION: 1. Choledocholithiasis, with endoscopic balloon sweep. These images were submitted for radiologic interpretation only. Please see the procedural report for the amount of contrast and the fluoroscopy time utilized. Electronically Signed   By: Lucrezia Europe M.D.   On: 01/13/2016 13:27    Microbiology: Recent Results (from the past 240 hour(s))  Culture, blood (Routine X 2) w Reflex to ID Panel     Status: None (Preliminary result)   Collection Time: 01/12/16  4:05 PM  Result Value Ref Range Status   Specimen Description BLOOD BLOOD LEFT HAND  Final   Special Requests BOTTLES DRAWN AEROBIC AND ANAEROBIC 10CC EA  Final   Culture   Final    NO GROWTH 4 DAYS Performed at [facility]    Report Status PENDING  Incomplete  Culture, blood (Routine X 2) w Reflex to ID Panel     Status: None (Preliminary result)   Collection Time: 01/12/16  4:15 PM  Result Value Ref Range Status   Specimen Description BLOOD BLOOD RIGHT HAND  Final   Special Requests BOTTLES DRAWN AEROBIC AND ANAEROBIC 10CC EA  Final   Culture   Final    NO GROWTH 4 DAYS Performed at [facility]    Report Status PENDING  Incomplete     Labs: Basic Metabolic Panel:  Recent Labs Lab 01/12/16 1605 01/13/16 0609 01/14/16 0543 01/15/16 0508  NA 135 138 135 134*  K 4.0 3.5 3.7 3.8  CL 99* 104 102 100*  CO2 '24 24 23 24  '$ GLUCOSE 150* 90 102* 112*  BUN 13 16 29* 24*  CREATININE 0.69 0.69 1.16* 0.84  CALCIUM 8.8* 8.3* 8.3* 8.5*   Liver  Function Tests:  Recent Labs Lab 01/13/16 0609 01/14/16 0543 01/15/16 0508  AST 305* 162* 72*  ALT 523* 333* 226*   ALKPHOS 151* 117 159*  BILITOT 5.0* 3.5* 2.5*  PROT 5.4* 5.3* 5.4*  ALBUMIN 2.9* 2.7* 2.8*    Recent Labs Lab 01/15/16 0508  LIPASE 22   No results for input(s): AMMONIA in the last 168 hours. CBC:  Recent Labs Lab 01/13/16 0609 01/14/16 0543 01/15/16 0508  WBC 10.6* 20.0* 15.0*  HGB 12.9 12.1 12.3  HCT 39.2 36.8 37.6  MCV 89.3 89.3 87.9  PLT 175 158 173   Cardiac Enzymes: No results for input(s): CKTOTAL, CKMB, CKMBINDEX, TROPONINI in the last 168 hours. BNP: BNP (last 3 results) No results for input(s): BNP in the last 8760 hours.  ProBNP (last 3 results) No results for input(s): PROBNP in the last 8760 hours.  CBG: No results for input(s): GLUCAP in the last 168 hours.

## 2016-01-16 NOTE — Progress Notes (Signed)
Patient ID: Laurie [name], female   DOB: 07/17/1935, 81 y.o.   MRN: [id number]    Progress Note   Subjective    Looks better today, only c/o is "sore " abdomen which is same pain she has had for years, and thrush in mouth   Objective   Vital signs in last 24 hours: Temp:  [97.6 F (36.4 C)-98.3 F (36.8 C)] 97.9 F (36.6 C) (02/16 0640) Pulse Rate:  [68-76] 68 (02/16 0640) Resp:  [16-18] 18 (02/16 0640) BP: (165-189)/(77-87) 178/77 mmHg (02/16 0640) SpO2:  [93 %-97 %] 95 % (02/16 0640) Last BM Date: 01/15/16 General: elderly   white female in NAD Heart:  Regular rate and rhythm; no murmurs- pulse 70's regular Lungs: Respirations even and unlabored, lungs CTA bilaterally Abdomen:  Soft, nontender and nondistended. Normal bowel sounds. Extremities:  Without edema. Neurologic:  Alert and oriented,  grossly normal neurologically. Psych:  Cooperative. Normal mood and affect.  Intake/Output from previous day: 02/15 0701 - 02/16 0700 In: 290 [P.O.:240; IV Piggyback:50] Out: 450 [Urine:450] Intake/Output this shift:    Lab Results:  Recent Labs  01/14/16 0543 01/15/16 0508  WBC 20.0* 15.0*  HGB 12.1 12.3  HCT 36.8 37.6  PLT 158 173   BMET  Recent Labs  01/14/16 0543 01/15/16 0508  NA 135 134*  K 3.7 3.8  CL 102 100*  CO2 23 24  GLUCOSE 102* 112*  BUN 29* 24*  CREATININE 1.16* 0.84  CALCIUM 8.3* 8.5*   LFT  Recent Labs  01/15/16 0508  PROT 5.4*  ALBUMIN 2.8*  AST 72*  ALT 226*  ALKPHOS 159*  BILITOT 2.5*   PT/INR No results for input(s): LABPROT, INR in the last 72 hours.  Studies/Results: No results found.     Assessment / Plan:    #1 81 yo female with cholangitis- day #4 hospital stay- was on Zosyn-converted to oral Cipro yesterday- BC her are negative- she had 1/2 cultures + from prior to admit J[facility]) -+ for gram neg rod.  ID pending - I cannot find result anywhere in epic/Care everywhere Pt is improving, LFT;s trending  down #2 Stable s/p ERCP and stone extraction 2/13 #3 COPD #4 Afib 2/15 #5 oral tnrush  Plan- pt is stable from GI standpoint - we will sign off  ID on organism needs followed up to assure Sensitive to Cipro She has follow up appt with Dr Loletha Carrow on 3/16 at 10 :93 am- will follow up LFT's at that time She should complete 10 more days of oral Cipro 500 po BID  Send home on Mycostatin oral susp QID      Principal Problem:   Transaminitis Active Problems:   Essential hypertension, benign   S/P lobectomy of lung   Abdominal aortic aneurysm (HCC)   Choledocholithiasis   RUQ abdominal pain   Jaundice   Pulmonary emphysema (HCC)   Aortic valve disease   Dilated bile duct   Bacterial cholangitis   Leukocytosis   Right upper quadrant pain   Gram-negative bacteremia ([place])     LOS: 4 days   Ananda Sitzer  01/16/2016, 9:54 AM

## 2016-01-16 NOTE — Care Management Note (Signed)
Case Management Note  Patient Details  Name: Laurie [name] MRN: [id number] Date of Birth: November 27, 1935  Subjective/Objective:  PT-HHPT-patient plesantly declined.                  Action/Plan:d/c home no needs or orders.   Expected Discharge Date:                  Expected Discharge Plan:  [place]  In-House Referral:     Discharge planning Services  CM Consult  Post Acute Care Choice:    Choice offered to:     DME Arranged:    DME Agency:     HH Arranged:    [facility] Agency:     Status of Service:  Completed, signed off  Medicare Important Message Given:  Yes Date Medicare IM Given:    Medicare IM give by:    Date Additional Medicare IM Given:    Additional Medicare Important Message give by:     If discussed at [place] of Stay Meetings, dates discussed:    Additional Comments:  Dessa Phi, RN 01/16/2016, 12:13 PM

## 2016-01-16 NOTE — Discharge Instructions (Signed)
Cholelithiasis Cholelithiasis (also called gallstones) is a form of gallbladder disease. The gallbladder is a small organ that helps you digest fats. Symptoms of gallstones are:  Feeling sick to your stomach (nausea).  Throwing up (vomiting).  Belly pain.  Yellowing of the skin (jaundice).  Sudden pain. You may feel the pain for minutes to hours.  Fever.  Pain to the touch. HOME CARE  Only take medicines as told by your doctor.  Eat a low-fat diet until you see your doctor again. Eating fat can result in pain.  Follow up with your doctor as told. Attacks usually happen time after time. Surgery is usually needed for permanent treatment. GET HELP RIGHT AWAY IF:   Your pain gets worse.  Your pain is not helped by medicines.  You have a fever and lasting symptoms for more than 2-3 days.  You have a fever and your symptoms suddenly get worse.  You keep feeling sick to your stomach and throwing up. MAKE SURE YOU:   Understand these instructions.  Will watch your condition.  Will get help right away if you are not doing well or get worse.   This information is not intended to replace advice given to you by your health care provider. Make sure you discuss any questions you have with your health care provider.   Document Released: 05/04/2008 Document Revised: 07/19/2013 Document Reviewed: 05/10/2013 Elsevier Interactive Patient Education 2016 Elsevier Inc. Ciprofloxacin extended-release tablets What is this medicine? CIPROFLOXACIN (sip roe FLOX a sin) is a quinolone antibiotic. It is used to treat certain kinds of bacterial infections. It will not work for colds, flu, or other viral infections. This medicine may be used for other purposes; ask your health care provider or pharmacist if you have questions. What should I tell my health care provider before I take this medicine? They need to know if you have any of these conditions: -bone problems -cerebral disease -history  of low potassium levels in the blood -irregular heartbeat -joint problems -kidney disease -myasthenia gravis -seizures -tendon problems -tingling of the fingers or toes, or other nerve disorder -an unusual or allergic reaction to ciprofloxacin, other antibiotics or medicines, foods, dyes, or preservatives -pregnant or trying to get pregnant -breast-feeding How should I use this medicine? Take this medicine by mouth with a full glass of water. Follow the directions on the prescription label. Do not split, crush, or chew the tablet. Take your medicine at regular intervals. Do not take your medicine more often than directed. Take all of your medicine as directed even if you think your are better. Do not skip doses or stop your medicine early. You can take this medicine with food or on an empty stomach. It can be taken with a meal that contains dairy or calcium, but do not take it alone with a dairy product, like milk or yogurt, or calcium-fortified juice. A special MedGuide will be given to you by the pharmacist with each prescription and refill. Be sure to read this information carefully each time. Talk to your pediatrician regarding the use of this medicine in children. Special care may be needed. Overdosage: If you think you have taken too much of this medicine contact a poison control center or emergency room at once. NOTE: This medicine is only for you. Do not share this medicine with others. What if I miss a dose? If you miss a dose, take it as soon as you can. If it is almost time for your next dose, take only  that dose. Do not take double or extra doses. Do not take more than one dose in a day. What may interact with this medicine? Do not take this medicine with any of the following medications: -cisapride -droperidol -terfenadine -tizanidine This medicine may also interact with the following medications: -antacids -birth control pills -caffeine -cyclosporin -didanosine (ddI)  buffered tablets or powder -medicines for diabetes -medicines for inflammation like ibuprofen, naproxen -methotrexate -multivitamins -omeprazole -phenytoin -probenecid -sucralfate -theophylline -warfarin This list may not describe all possible interactions. Give your health care provider a list of all the medicines, herbs, non-prescription drugs, or dietary supplements you use. Also tell them if you smoke, drink alcohol, or use illegal drugs. Some items may interact with your medicine. What should I watch for while using this medicine? Tell your doctor or health care professional if your symptoms do not improve. Do not treat diarrhea with over the counter products. Contact your doctor if you have diarrhea that lasts more than 2 days or if it is severe and watery. You may get drowsy or dizzy. Do not drive, use machinery, or do anything that needs mental alertness until you know how this medicine affects you. Do not stand or sit up quickly, especially if you are an older patient. This reduces the risk of dizzy or fainting spells. This medicine can make you more sensitive to the sun. Keep out of the sun. If you cannot avoid being in the sun, wear protective clothing and use sunscreen. Do not use sun lamps or tanning beds/booths. Avoid antacids, aluminum, calcium, iron, magnesium, and zinc products for 6 hours before and 2 hours after taking a dose of this medicine. What side effects may I notice from receiving this medicine? Side effects that you should report to your doctor or health care professional as soon as possible: -allergic reactions like skin rash or hives, swelling of the face, lips, or tongue -anxious -confusion -depressed mood -diarrhea -fast, irregular heartbeat -hallucination, loss of contact with reality -joint, muscle, or tendon pain or swelling -pain, tingling, numbness in the hands or feet -suicidal thoughts or other mood changes -sunburn -unusually weak or tired Side  effects that usually do not require medical attention (report to your doctor or health care professional if they continue or are bothersome): -dry mouth -headache -nausea -trouble sleeping This list may not describe all possible side effects. Call your doctor for medical advice about side effects. You may report side effects to FDA at 1-800-FDA-1088. Where should I keep my medicine? Keep out of the reach of children. Store at room temperature between 15 to 30 degrees C (59 to 86 degrees F). Keep container tightly closed. Throw away any unused medicine after the expiration date. NOTE: This sheet is a summary. It may not cover all possible information. If you have questions about this medicine, talk to your doctor, pharmacist, or health care provider.    2016, Elsevier/Gold Standard. (2015-06-27 12:49:29)

## 2016-01-17 LAB — CULTURE, BLOOD (ROUTINE X 2)
CULTURE: NO GROWTH
CULTURE: NO GROWTH

## 2016-01-29 ENCOUNTER — Ambulatory Visit (INDEPENDENT_AMBULATORY_CARE_PROVIDER_SITE_OTHER): Payer: Commercial Managed Care - HMO | Admitting: Family Medicine

## 2016-01-29 ENCOUNTER — Other Ambulatory Visit: Payer: Self-pay | Admitting: Family Medicine

## 2016-01-29 ENCOUNTER — Encounter: Payer: Self-pay | Admitting: Family Medicine

## 2016-01-29 VITALS — BP 125/76 | HR 80 | Temp 98.0°F | Wt 156.0 lb

## 2016-01-29 DIAGNOSIS — K8309 Other cholangitis: Principal | ICD-10-CM

## 2016-01-29 DIAGNOSIS — K830 Cholangitis: Secondary | ICD-10-CM

## 2016-01-29 DIAGNOSIS — B9689 Other specified bacterial agents as the cause of diseases classified elsewhere: Secondary | ICD-10-CM

## 2016-01-29 DIAGNOSIS — R1084 Generalized abdominal pain: Secondary | ICD-10-CM | POA: Diagnosis not present

## 2016-01-29 NOTE — Progress Notes (Signed)
CC: Laurie [name] is a 81 y.o. female is here for Weakness; Abdominal Pain; and Thrush   Subjective: HPI:  Patient complains of a general sense of feeling unwell with decreased appetite ever since her discharge from [facility] little more than a week ago. Symptoms have not been getting better or worse since onset and are moderate in severity. She has some abdominal pain however she states that this is been present ever since she got her gallbladder removed last year. She denies any nausea, vomiting, flank pain, genitourinary complaints, constipation, diarrhea or fever. She denies chills, confusion. Family members insisted that she come today to be evaluated due to looking pale. She denies chest pain or respiratory complaints. Nothing seems to make her generalized sense of feeling unwell better or worse but she does notice it is almost absent first thing in the morning.  On the day that she was admitted she had a blood culture that eventually grew out Klebsiella that was sensitive to ciprofloxacin that she finished last week.   Review Of Systems Outlined In HPI  Past Medical History  Diagnosis Date  . Anemia     iron deficiency  . Shingles 11-09  . COPD (chronic obstructive pulmonary disease) ([place]) 2005    on CXR with R apical scaring  . Atrial fibrillation ([place])     post op  . Hypertension   . Cerebrovascular disease   . S/P aortic valve replacement 08/28/2008    [facility]  . S/P lobectomy of lung 08/28/2008    rt middle lobe  dr. Roxy Manns  . AAA (abdominal aortic aneurysm) ([place])   . CAD (coronary artery disease)   . Hypercholesterolemia     Hx: of  . Headache(784.0)     Hx: of migraines in the past  . Carcinoma of lung ([place]) 08/28/2008    T2N0 moderately diff. squamous cell CA  . Ascending aorta dilatation [facility])     Past Surgical History  Procedure Laterality Date  . Aapy    . Tonsillectomy and adenoidectomy    . U/s guided aspiration of r breast cyst   2009    at the breast  clinic  . Rml removed dr Ricard Dillon for lung cancer  08/28/2008    T2N0 squamous cell CA  . Cardiac valve replacement    . Abdominal surgery    . Tonsillectomy    . Aortic valve [facility]  [facility]28[facility]  . Cardiac valve surgery  08/28/2008    septal myomectomy  . Colonoscopy      Hx: of  . Cataract extraction w/ intraocular lens  implant, bilateral      Hx:of  . Dilation and curettage of uterus    . Cardiac catheterization    . Appendectomy    . Cholecystectomy N/A 12/13/2013    Procedure: LAPAROSCOPIC CHOLECYSTECTOMY;  Surgeon: ARalene Ok MD;  [place] [facility]  Service: General;  Laterality: N/A;  . Ercp N/A 05/08/2015    Procedure: ENDOSCOPIC RETROGRADE CHOLANGIOPANCREATOGRAPHY (ERCP);  Surgeon: RInda Castle MD;  Location: [facility]  Service: Endoscopy;  Laterality: N/A;  . Ercp N/A 01/13/2016    Procedure: ENDOSCOPIC RETROGRADE CHOLANGIOPANCREATOGRAPHY (ERCP);  Surgeon: HDoran Stabler MD;  [facility];  Service: Endoscopy;  Laterality: N/A;   Family History  Problem Relation Age of Onset  . Stroke Father   . Diabetes Son     brittle diabetes    Social History   Social  History  . Marital Status: Single    Spouse Name: N/A  . Number of Children: N/A  . Years of Education: N/A   Occupational History  . Not on file.   Social History Main Topics  . Smoking status: Former Smoker -- 40 years  . Smokeless tobacco: Never Used  . Alcohol Use: No  . Drug Use: No  . Sexual Activity: Not on file   Other Topics Concern  . Not on file   Social History Narrative     Objective: BP 125/76 mmHg  Pulse 80  Temp(Src) 98 F (36.7 C) (Oral)  Wt 156 lb (70.761 kg)  General: Alert and Oriented, No Acute Distress HEENT: Pupils equal, round, reactive to light. Conjunctivae clear.  External ears unremarkable, canals clear with intact TMs with appropriate landmarks.  Middle ear appears open without effusion.  Pink inferior turbinates.  Moist mucous membranes, pharynx without inflammation nor lesions.  Neck supple without palpable lymphadenopathy nor abnormal masses. Lungs: comfortable work of breathing with trace expiratory wheezing in the upper lung fields. No rhonchi or rales. Cardiac: Regular rate and rhythm. Normal S1/S2.  Grade 2 holosystolic murmur over the second left intercostal space. Abdomen: slightly tender in the epigastric region reproducing her chronic abdominal pain. No rebound or guarding. Extremities: No peripheral edema.  Strong peripheral pulses.  Mental Status: No depression, anxiety, nor agitation. Skin: Warm and dry.  Assessment & Plan: Jenet was seen today for weakness, abdominal pain and thrush.  Diagnoses and all orders for this visit:  Bacterial cholangitis -     Blood culture (routine single) -     COMPLETE METABOLIC PANEL WITH GFR -     CBC with Differential/Platelet -     Lipase -     Urine culture -     Urinalysis, Routine w reflex microscopic  Generalized abdominal pain -     COMPLETE METABOLIC PANEL WITH GFR -     CBC with Differential/Platelet -     Lipase -     Urine culture -     Urinalysis, Routine w reflex microscopic   Afebrile, not tachycardic, acceptable blood pressure and no red flags to need emergency room evaluation at this time. Will begin the evaluation above, possible acute Pancreatitis due to recent ERCP.Signs and symptoms requring emergent/urgent reevaluation were discussed with the patient.ultimate plan will be based on the above results that are expected to return by tomorrow morning.  Return if symptoms worsen or fail to improve.

## 2016-01-30 ENCOUNTER — Telehealth: Payer: Self-pay | Admitting: Family Medicine

## 2016-01-30 LAB — CBC WITH DIFFERENTIAL/PLATELET
BASOS ABS: 0.1 10*3/uL (ref 0.0–0.1)
BASOS PCT: 1 % (ref 0–1)
EOS ABS: 0.3 10*3/uL (ref 0.0–0.7)
Eosinophils Relative: 4 % (ref 0–5)
HCT: 42 % (ref 36.0–46.0)
Hemoglobin: 14.5 g/dL (ref 12.0–15.0)
Lymphocytes Relative: 28 % (ref 12–46)
Lymphs Abs: 1.8 10*3/uL (ref 0.7–4.0)
MCH: 29.4 pg (ref 26.0–34.0)
MCHC: 34.5 g/dL (ref 30.0–36.0)
MCV: 85.2 fL (ref 78.0–100.0)
MONOS PCT: 14 % — AB (ref 3–12)
MPV: 10 fL (ref 8.6–12.4)
Monocytes Absolute: 0.9 10*3/uL (ref 0.1–1.0)
NEUTROS PCT: 53 % (ref 43–77)
Neutro Abs: 3.5 10*3/uL (ref 1.7–7.7)
Platelets: 418 10*3/uL — ABNORMAL HIGH (ref 150–400)
RBC: 4.93 MIL/uL (ref 3.87–5.11)
RDW: 13.7 % (ref 11.5–15.5)
WBC: 6.6 10*3/uL (ref 4.0–10.5)

## 2016-01-30 LAB — COMPLETE METABOLIC PANEL WITH GFR
ALBUMIN: 3.8 g/dL (ref 3.6–5.1)
ALK PHOS: 126 U/L (ref 33–130)
ALT: 28 U/L (ref 6–29)
AST: 23 U/L (ref 10–35)
BILIRUBIN TOTAL: 0.7 mg/dL (ref 0.2–1.2)
BUN: 8 mg/dL (ref 7–25)
CO2: 25 mmol/L (ref 20–31)
Calcium: 9.4 mg/dL (ref 8.6–10.4)
Chloride: 94 mmol/L — ABNORMAL LOW (ref 98–110)
Creat: 0.69 mg/dL (ref 0.60–0.93)
GFR, EST NON AFRICAN AMERICAN: 84 mL/min (ref >=60)
GFR, Est African American: >89 mL/min (ref >=60)
Glucose, Bld: 105 mg/dL — ABNORMAL HIGH (ref 65–99)
POTASSIUM: 4.5 mmol/L (ref 3.5–5.3)
SODIUM: 133 mmol/L — AB (ref 135–146)
Total Protein: 6.5 g/dL (ref 6.1–8.1)

## 2016-01-30 LAB — LIPASE: LIPASE: 24 U/L (ref 7–60)

## 2016-01-30 NOTE — Telephone Encounter (Signed)
Awaiting call back.

## 2016-01-30 NOTE — Telephone Encounter (Signed)
Will you please let patient/family know that her kidney funciton, liver function, pancreatic function and blood cell counts were all normal.  Her sodium level was mildly deficient and this can be improved with increasing calories in her diet.  The urine culture and blood culture are still pending and are unlikely to be available for a few days.  Overall labs are reassuring so far, I'll make sure we keep in touch.

## 2016-01-31 ENCOUNTER — Telehealth: Payer: Self-pay | Admitting: Family Medicine

## 2016-01-31 ENCOUNTER — Telehealth: Payer: Self-pay

## 2016-01-31 LAB — URINE CULTURE

## 2016-01-31 LAB — URINALYSIS, ROUTINE W REFLEX MICROSCOPIC

## 2016-01-31 MED ORDER — SULFAMETHOXAZOLE-TRIMETHOPRIM 800-160 MG PO TABS: 1.0000 | ORAL_TABLET | Freq: Two times a day (BID) | ORAL | Status: DC

## 2016-01-31 NOTE — Telephone Encounter (Signed)
[facility] called and states the urine cup leaked into the bag. They were unable to process sample.

## 2016-01-31 NOTE — Telephone Encounter (Signed)
Patient called and I told her that I would call her right back so I could get to my computer to look at her results. Called back and got the vm.left complete results on vm

## 2016-01-31 NOTE — Telephone Encounter (Signed)
Left detailed message.   

## 2016-01-31 NOTE — Telephone Encounter (Signed)
Will you please let patient/family know that her urine culture look suspicious for a UTI and I'd recommend starting on an antibiotic called trimethoprim-sulfa [facility] [address].  I'd recommend she f/u with her PCP Dr. Jerilynn Mages next week if not feeling better.

## 2016-01-31 NOTE — Telephone Encounter (Signed)
Notified patient to call office for results and recommendations.

## 2016-01-31 NOTE — Telephone Encounter (Signed)
Ok to call pt and let her know ill have to collect again.

## 2016-02-04 ENCOUNTER — Other Ambulatory Visit: Payer: Self-pay | Admitting: Family Medicine

## 2016-02-04 ENCOUNTER — Other Ambulatory Visit: Payer: Self-pay

## 2016-02-04 ENCOUNTER — Encounter: Payer: Self-pay | Admitting: Family Medicine

## 2016-02-04 ENCOUNTER — Ambulatory Visit (INDEPENDENT_AMBULATORY_CARE_PROVIDER_SITE_OTHER): Payer: Commercial Managed Care - HMO | Admitting: Family Medicine

## 2016-02-04 VITALS — BP 140/67 | HR 73 | Wt 158.0 lb

## 2016-02-04 DIAGNOSIS — R208 Other disturbances of skin sensation: Secondary | ICD-10-CM

## 2016-02-04 DIAGNOSIS — R740 Nonspecific elevation of levels of transaminase and lactic acid dehydrogenase [LDH]: Secondary | ICD-10-CM

## 2016-02-04 DIAGNOSIS — B370 Candidal stomatitis: Secondary | ICD-10-CM | POA: Diagnosis not present

## 2016-02-04 DIAGNOSIS — K8050 Calculus of bile duct without cholangitis or cholecystitis without obstruction: Secondary | ICD-10-CM | POA: Diagnosis not present

## 2016-02-04 DIAGNOSIS — K8309 Other cholangitis: Secondary | ICD-10-CM

## 2016-02-04 DIAGNOSIS — K5901 Slow transit constipation: Secondary | ICD-10-CM | POA: Diagnosis not present

## 2016-02-04 DIAGNOSIS — R209 Unspecified disturbances of skin sensation: Secondary | ICD-10-CM

## 2016-02-04 DIAGNOSIS — R7401 Elevation of levels of liver transaminase levels: Secondary | ICD-10-CM

## 2016-02-04 DIAGNOSIS — K830 Cholangitis: Secondary | ICD-10-CM

## 2016-02-04 DIAGNOSIS — B9689 Other specified bacterial agents as the cause of diseases classified elsewhere: Secondary | ICD-10-CM

## 2016-02-04 DIAGNOSIS — F411 Generalized anxiety disorder: Secondary | ICD-10-CM

## 2016-02-04 MED ORDER — ALPRAZOLAM 0.5 MG PO TABS: 0.5000 mg | ORAL_TABLET | Freq: Two times a day (BID) | ORAL | Status: DC

## 2016-02-04 MED ORDER — METOPROLOL TARTRATE 50 MG PO TABS: 75.0000 mg | ORAL_TABLET | Freq: Two times a day (BID) | ORAL | Status: DC

## 2016-02-04 MED ORDER — PANTOPRAZOLE SODIUM 40 MG PO TBEC: DELAYED_RELEASE_TABLET | ORAL | Status: DC

## 2016-02-04 MED ORDER — POLYETHYLENE GLYCOL 3350 17 GM/SCOOP PO POWD: ORAL | Status: DC

## 2016-02-04 MED ORDER — LISINOPRIL 40 MG PO TABS: ORAL_TABLET | ORAL | Status: DC

## 2016-02-04 NOTE — Addendum Note (Signed)
Addended by: Teddy Spike on: 02/04/2016 04:54 PM   Modules accepted: Orders

## 2016-02-04 NOTE — Progress Notes (Signed)
   Subjective:    Patient ID: Laurie [name], female    DOB: 19-Jan-1935, 81 y.o.   MRN: [id number]  HPI This Laurie [name] is here today for a hospital follow-up. She is a 81 year old female who was seen [facility] emergency department on 316 for abdominal pain. She actually had her gallbladder removed in 2015. She was seen here in our office by one of our providers and noted to have elevated liver enzymes. Evidence of biliary ductal dilatation with concern for an obstructing stone. She was transferred to [facility] long. She was treated with so sent and then switched to ciprofloxacin. She is also diagnosed with oral thrush and given nystatin swish and swallow as well as fluconazole. He did have a mild bump in her creatinine which seemed to improve after hydration. She was started on pantoprazole.   Says her stomach is sore to touch adn more sore when she sits feels better when she stands up. Her appetite has been down as well.    Say her knees to her feet feel cold. Says stay constantly cold. Has been like that for over a month. Plans on gettting some boost to help with her nutrition.    She feels like she stil has some oral thrush.  She is on protonix as well.    Review of Systems     Objective:   Physical Exam  Constitutional: She is oriented to person, place, and time. She appears well-developed and well-nourished.  HENT:  Head: Normocephalic and atraumatic.  Mouth/Throat: Oropharynx is clear and moist.  Neck: Neck supple. No thyromegaly present.  Cardiovascular: Normal rate, regular rhythm and normal heart sounds.   Pulmonary/Chest: Effort normal and breath sounds normal.  Abdominal: Soft. Bowel sounds are normal. She exhibits no distension and no mass. There is tenderness. There is no rebound and no guarding.  TTP in the epigastic and RUQ.  No rebound or guarding.   Lymphadenopathy:    She has no cervical adenopathy.  Neurological: She is alert and oriented to person, place, and time.  Skin:  Skin is warm and dry.  Psychiatric: She has a normal mood and affect. Her behavior is normal.          Assessment & Plan:  Bacterial cholangitis - she completed the cipro and the stone was removed.    Oral thrush - will swab to see if infection is cleared or not.  OP looks OK.   GERD - will refill her pantoprazole.   Constipation - the miralax generic is working well.   Feet cold.  This is not new and started before her hospitalization. I think part of this is just that she hasn't been eating well for several weeks and she just feels really tired and run down. Encouraged her to work on nutrition. She plans on getting some boost to help with that. I provided some coupon cards for her to make a little bit more affordable. I like to see her back in a month to see how she's doing and work this up further. He does have a history of a thoracic aneurysm. Hemoglobin was stable during admission.  She did have a mild bump in creatinine during admission. Recommend recheck that now that she is back home and back on her lisinopril.  Anxiety-I did refill her alprazolam today. She does not use it daily. Monitor again to use it sparingly to avoid becoming habit forming.

## 2016-02-05 LAB — CULTURE, BLOOD (SINGLE): ORGANISM ID, BACTERIA: NO GROWTH

## 2016-02-06 LAB — FUNGAL STAIN

## 2016-02-10 ENCOUNTER — Other Ambulatory Visit: Payer: Self-pay | Admitting: Family Medicine

## 2016-02-10 NOTE — Telephone Encounter (Signed)
Notified patient that script would be at the office to pick up.

## 2016-02-10 NOTE — Telephone Encounter (Signed)
Patient is taking tramadol for abd pain? Last seen 02/04/2016 for hospital f/u and had constipation at that point and was not eating well.called and left a message for patient asking her if she still takes tramadol for abdominal pain

## 2016-02-13 ENCOUNTER — Ambulatory Visit (INDEPENDENT_AMBULATORY_CARE_PROVIDER_SITE_OTHER): Payer: Commercial Managed Care - HMO | Admitting: Gastroenterology

## 2016-02-13 ENCOUNTER — Encounter: Payer: Self-pay | Admitting: Gastroenterology

## 2016-02-13 ENCOUNTER — Telehealth: Payer: Self-pay | Admitting: Family Medicine

## 2016-02-13 VITALS — BP 120/72 | HR 64 | Ht 67.0 in | Wt 158.2 lb

## 2016-02-13 DIAGNOSIS — K8050 Calculus of bile duct without cholangitis or cholecystitis without obstruction: Secondary | ICD-10-CM

## 2016-02-13 DIAGNOSIS — R1011 Right upper quadrant pain: Secondary | ICD-10-CM | POA: Diagnosis not present

## 2016-02-13 MED ORDER — URSODIOL 500 MG PO TABS: 500.0000 mg | ORAL_TABLET | Freq: Every day | ORAL | Status: DC

## 2016-02-13 MED ORDER — DICYCLOMINE HCL 20 MG PO TABS: 10.0000 mg | ORAL_TABLET | Freq: Two times a day (BID) | ORAL | Status: DC | PRN

## 2016-02-13 NOTE — Telephone Encounter (Signed)
Received a call from [place] at [place] stating patient was in office and needed [facility] authorization for her appointment. I sent the referral through [employer] and received authorization [id number] good for 6 visits from  02/13/2016 - 08/11/2016. Patient is seeing Dr. Loletha Carrow for slow transit constipation. - CF

## 2016-02-13 NOTE — Patient Instructions (Signed)
We [facility] prescriptions to  [facility].

## 2016-02-13 NOTE — Progress Notes (Signed)
[facility] Progress Note  Chief Complaint: Choledocholithiasis and RUQ pain  Subjective History:  Laurie [name] follows up with me after hospital stay last month for cholangitis due to multiple CBD stones. She had gram-negative bacteremia treated with 14 days of antibiotics. As before, she continues to have chronic intermittent right upper quadrant pain, which she reports has been present since her cholecystectomy years ago. Sometimes the pain is down the entire right side of the abdomen. It does not radiate elsewhere, has no clear triggers or relieving factors, and often is improved with dicyclomine. As an aside, she also reports intermittent chest pain since her sternotomy many years ago, and that it is worse in cold weather.  ROS: Cardiovascular:  no chest pain Respiratory: no dyspnea  The patient's Past Medical, Family and Social History were reviewed and are on file in the EMR.  Objective:  Med list reviewed  Vital signs in last 24 hrs: Filed Vitals:   02/13/16 1046  BP: 120/72  Pulse: 64    Physical Exam   HEENT: sclera anicteric, oral mucosa moist without lesions  Neck: supple, no thyromegaly, JVD or lymphadenopathy  Cardiac: RRR without murmurs, S1S2 heard, no peripheral edema  Pulm: clear to auscultation bilaterally, normal RR and effort noted  Abdomen: soft, Mild RUQ tenderness consistent with my previous exams, with active bowel sounds. No guarding or palpable hepatosplenomegaly.  Skin; warm and dry, no jaundice or rash    '@ASSESSMENTPLANBEGIN'$ @ Assessment: Encounter Diagnoses  Name Primary?  . RUQ pain Yes  . Choledocholithiasis    The cholangitis resolved during hospital stay, CBD stones were completely removed during ERCP, but she continues to have chronic right upper quadrant pain that is clearly postsurgical.   Plan: I've started UDCA 500 mg once daily, which will hopefully make her bile less lithogenic, thereby decreasing the risk of recurrent CBD  stones. Her CBD is dilated and tortuous, which is a set up for recurrent stone formation. In addition, she has a periampullary diverticulum, which precluded a large sphincterotomy.  She will follow up in one year [name]

## 2016-02-17 ENCOUNTER — Telehealth: Payer: Self-pay | Admitting: Gastroenterology

## 2016-02-17 NOTE — Telephone Encounter (Signed)
Rx was written on 02-13-16. Pt states Rx cost is expensive. Is there an alternative? Please advise.

## 2016-02-17 NOTE — Telephone Encounter (Signed)
Unfortunately there is not another great medical option for gallstone dissolution There is some data for Zetia but not enough to prescribe at this time I had a patient where the GoodRx prescription card made ursodiol more affordable. She should try this if she has not already

## 2016-02-18 NOTE — Telephone Encounter (Signed)
Pt returned call. She states she has a good RX card already. After working on it together it looks [name] [employer]. Pt will call [facility] and [facility] She will also call back if she has any further problems.

## 2016-02-18 NOTE — Telephone Encounter (Signed)
Left a message to return call.  

## 2016-03-08 ENCOUNTER — Emergency Department (HOSPITAL_COMMUNITY): Payer: Commercial Managed Care - HMO

## 2016-03-08 ENCOUNTER — Encounter (HOSPITAL_COMMUNITY): Payer: Self-pay | Admitting: Emergency Medicine

## 2016-03-08 ENCOUNTER — Inpatient Hospital Stay (HOSPITAL_COMMUNITY)
Admission: EM | Admit: 2016-03-08 | Discharge: 2016-03-14 | DRG: 871 | Disposition: A | Discharge status: Home Health | Payer: Commercial Managed Care - HMO | Attending: Internal Medicine | Admitting: Internal Medicine

## 2016-03-08 DIAGNOSIS — Z888 Allergy status to other drugs, medicaments and biological substances status: Secondary | ICD-10-CM | POA: Diagnosis not present

## 2016-03-08 DIAGNOSIS — Z66 Do not resuscitate: Secondary | ICD-10-CM | POA: Diagnosis present

## 2016-03-08 DIAGNOSIS — E876 Hypokalemia: Secondary | ICD-10-CM | POA: Diagnosis present

## 2016-03-08 DIAGNOSIS — J449 Chronic obstructive pulmonary disease, unspecified: Secondary | ICD-10-CM | POA: Diagnosis present

## 2016-03-08 DIAGNOSIS — Z952 Presence of prosthetic heart valve: Secondary | ICD-10-CM | POA: Diagnosis not present

## 2016-03-08 DIAGNOSIS — Z85118 Personal history of other malignant neoplasm of bronchus and lung: Secondary | ICD-10-CM

## 2016-03-08 DIAGNOSIS — I712 Thoracic aortic aneurysm, without rupture, unspecified: Secondary | ICD-10-CM | POA: Diagnosis present

## 2016-03-08 DIAGNOSIS — R109 Unspecified abdominal pain: Secondary | ICD-10-CM | POA: Diagnosis present

## 2016-03-08 DIAGNOSIS — Z833 Family history of diabetes mellitus: Secondary | ICD-10-CM

## 2016-03-08 DIAGNOSIS — Z79899 Other long term (current) drug therapy: Secondary | ICD-10-CM | POA: Diagnosis not present

## 2016-03-08 DIAGNOSIS — K8309 Other cholangitis: Secondary | ICD-10-CM | POA: Diagnosis present

## 2016-03-08 DIAGNOSIS — A419 Sepsis, unspecified organism: Secondary | ICD-10-CM | POA: Diagnosis present

## 2016-03-08 DIAGNOSIS — I714 Abdominal aortic aneurysm, without rupture, unspecified: Secondary | ICD-10-CM | POA: Diagnosis present

## 2016-03-08 DIAGNOSIS — R0600 Dyspnea, unspecified: Secondary | ICD-10-CM

## 2016-03-08 DIAGNOSIS — Z902 Acquired absence of lung [part of]: Secondary | ICD-10-CM

## 2016-03-08 DIAGNOSIS — K838 Other specified diseases of biliary tract: Secondary | ICD-10-CM | POA: Diagnosis not present

## 2016-03-08 DIAGNOSIS — I77810 Thoracic aortic ectasia: Secondary | ICD-10-CM | POA: Diagnosis present

## 2016-03-08 DIAGNOSIS — R0602 Shortness of breath: Secondary | ICD-10-CM

## 2016-03-08 DIAGNOSIS — Z823 Family history of stroke: Secondary | ICD-10-CM

## 2016-03-08 DIAGNOSIS — Z7982 Long term (current) use of aspirin: Secondary | ICD-10-CM | POA: Diagnosis not present

## 2016-03-08 DIAGNOSIS — Z953 Presence of xenogenic heart valve: Secondary | ICD-10-CM

## 2016-03-08 DIAGNOSIS — K830 Cholangitis: Secondary | ICD-10-CM | POA: Diagnosis present

## 2016-03-08 DIAGNOSIS — I2510 Atherosclerotic heart disease of native coronary artery without angina pectoris: Secondary | ICD-10-CM | POA: Diagnosis present

## 2016-03-08 DIAGNOSIS — R1011 Right upper quadrant pain: Secondary | ICD-10-CM | POA: Diagnosis not present

## 2016-03-08 DIAGNOSIS — Z9049 Acquired absence of other specified parts of digestive tract: Secondary | ICD-10-CM

## 2016-03-08 DIAGNOSIS — I480 Paroxysmal atrial fibrillation: Secondary | ICD-10-CM | POA: Diagnosis present

## 2016-03-08 DIAGNOSIS — I10 Essential (primary) hypertension: Secondary | ICD-10-CM | POA: Diagnosis present

## 2016-03-08 DIAGNOSIS — K8031 Calculus of bile duct with cholangitis, unspecified, with obstruction: Secondary | ICD-10-CM | POA: Diagnosis not present

## 2016-03-08 DIAGNOSIS — I739 Peripheral vascular disease, unspecified: Secondary | ICD-10-CM | POA: Diagnosis present

## 2016-03-08 DIAGNOSIS — Z954 Presence of other heart-valve replacement: Secondary | ICD-10-CM | POA: Diagnosis not present

## 2016-03-08 DIAGNOSIS — I4891 Unspecified atrial fibrillation: Secondary | ICD-10-CM | POA: Diagnosis present

## 2016-03-08 DIAGNOSIS — Z8673 Personal history of transient ischemic attack (TIA), and cerebral infarction without residual deficits: Secondary | ICD-10-CM

## 2016-03-08 DIAGNOSIS — E7800 Pure hypercholesterolemia, unspecified: Secondary | ICD-10-CM | POA: Diagnosis present

## 2016-03-08 DIAGNOSIS — R7989 Other specified abnormal findings of blood chemistry: Secondary | ICD-10-CM | POA: Diagnosis present

## 2016-03-08 DIAGNOSIS — F411 Generalized anxiety disorder: Secondary | ICD-10-CM | POA: Diagnosis present

## 2016-03-08 DIAGNOSIS — R945 Abnormal results of liver function studies: Secondary | ICD-10-CM

## 2016-03-08 DIAGNOSIS — K8050 Calculus of bile duct without cholangitis or cholecystitis without obstruction: Secondary | ICD-10-CM

## 2016-03-08 DIAGNOSIS — J9601 Acute respiratory failure with hypoxia: Secondary | ICD-10-CM | POA: Diagnosis present

## 2016-03-08 DIAGNOSIS — R911 Solitary pulmonary nodule: Secondary | ICD-10-CM | POA: Diagnosis not present

## 2016-03-08 DIAGNOSIS — Z87891 Personal history of nicotine dependence: Secondary | ICD-10-CM

## 2016-03-08 DIAGNOSIS — K8020 Calculus of gallbladder without cholecystitis without obstruction: Secondary | ICD-10-CM | POA: Diagnosis present

## 2016-03-08 LAB — COMPREHENSIVE METABOLIC PANEL
ALT: 234 U/L — AB (ref 14–54)
AST: 323 U/L — AB (ref 15–41)
Albumin: 3.9 g/dL (ref 3.5–5.0)
Alkaline Phosphatase: 123 U/L (ref 38–126)
Anion gap: 11 (ref 5–15)
BUN: 11 mg/dL (ref 6–20)
CHLORIDE: 95 mmol/L — AB (ref 101–111)
CO2: 26 mmol/L (ref 22–32)
CREATININE: 0.71 mg/dL (ref 0.44–1.00)
Calcium: 9.2 mg/dL (ref 8.9–10.3)
GFR calc Af Amer: >60 mL/min (ref >60)
GFR calc non Af Amer: >60 mL/min (ref >60)
Glucose, Bld: 140 mg/dL — ABNORMAL HIGH (ref 65–99)
POTASSIUM: 3.8 mmol/L (ref 3.5–5.1)
SODIUM: 132 mmol/L — AB (ref 135–145)
Total Bilirubin: 1.8 mg/dL — ABNORMAL HIGH (ref 0.3–1.2)
Total Protein: 7 g/dL (ref 6.5–8.1)

## 2016-03-08 LAB — URINALYSIS, ROUTINE W REFLEX MICROSCOPIC
Bilirubin Urine: NEGATIVE
GLUCOSE, UA: NEGATIVE mg/dL
HGB URINE DIPSTICK: NEGATIVE
Ketones, ur: NEGATIVE mg/dL
LEUKOCYTES UA: NEGATIVE
Nitrite: NEGATIVE
PH: 7 (ref 5.0–8.0)
PROTEIN: NEGATIVE mg/dL
Specific Gravity, Urine: 1.027 (ref 1.005–1.030)

## 2016-03-08 LAB — CBC
HEMATOCRIT: 42.9 % (ref 36.0–46.0)
Hemoglobin: 14.6 g/dL (ref 12.0–15.0)
MCH: 28.9 pg (ref 26.0–34.0)
MCHC: 34 g/dL (ref 30.0–36.0)
MCV: 84.8 fL (ref 78.0–100.0)
PLATELETS: 249 10*3/uL (ref 150–400)
RBC: 5.06 MIL/uL (ref 3.87–5.11)
RDW: 13.3 % (ref 11.5–15.5)
WBC: 15.8 10*3/uL — AB (ref 4.0–10.5)

## 2016-03-08 LAB — TROPONIN I: Troponin I: <0.03 ng/mL (ref <0.031)

## 2016-03-08 LAB — BRAIN NATRIURETIC PEPTIDE: B Natriuretic Peptide: 115.5 pg/mL — ABNORMAL HIGH (ref 0.0–100.0)

## 2016-03-08 LAB — LIPASE, BLOOD: LIPASE: 21 U/L (ref 11–51)

## 2016-03-08 MED ORDER — HYDROMORPHONE HCL 1 MG/ML IJ SOLN
0.5000 mg | Freq: Once | INTRAMUSCULAR | Status: AC
  Administered 2016-03-08: 0.5 mg via INTRAVENOUS
  Filled 2016-03-08: qty 1

## 2016-03-08 MED ORDER — LORAZEPAM 2 MG/ML IJ SOLN
0.5000 mg | Freq: Once | INTRAMUSCULAR | Status: AC
  Administered 2016-03-08: 0.5 mg via INTRAVENOUS
  Filled 2016-03-08: qty 1

## 2016-03-08 MED ORDER — IOPAMIDOL (ISOVUE-300) INJECTION 61%
80.0000 mL | Freq: Once | INTRAVENOUS | Status: AC | PRN
  Administered 2016-03-08: 80 mL via INTRAVENOUS

## 2016-03-08 MED ORDER — ONDANSETRON HCL 4 MG/2ML IJ SOLN
4.0000 mg | Freq: Once | INTRAMUSCULAR | Status: AC
  Administered 2016-03-08: 4 mg via INTRAVENOUS
  Filled 2016-03-08: qty 2

## 2016-03-08 MED ORDER — SODIUM CHLORIDE 0.9 % IV SOLN
INTRAVENOUS | Status: DC
  Administered 2016-03-08 – 2016-03-09 (×4): via INTRAVENOUS
  Administered 2016-03-09: 500 mL/h via INTRAVENOUS
  Administered 2016-03-10: 03:00:00 via INTRAVENOUS

## 2016-03-08 MED ORDER — IOHEXOL 300 MG/ML  SOLN: 25.0000 mL | Freq: Once | INTRAMUSCULAR | Status: DC | PRN

## 2016-03-08 MED ORDER — IOHEXOL 300 MG/ML  SOLN
25.0000 mL | Freq: Once | INTRAMUSCULAR | Status: AC | PRN
Start: 2016-03-08 — End: 2016-03-08
  Administered 2016-03-08: 25 mL via ORAL

## 2016-03-08 NOTE — ED Notes (Signed)
Pt's O2 sat dropped to 83% on RA, 3L O2 placed on pt via [place] w/o improvement in O2 sats.  Non re breather placed on pt w/ an immediate improvement of O2 sats to 100%.  Non re breather removed and pt placed back on 2L via [place] O2 sats 99%.

## 2016-03-08 NOTE — ED Notes (Signed)
Pt states that she has had severe abdominal pain today. Hx of gallbladder removal recently and sepsis last month. Alert and oriented.

## 2016-03-08 NOTE — ED Provider Notes (Signed)
CSN: [id number]     Arrival date & time 03/08/16  1859 History   First MD Initiated Contact with Patient 03/08/16 1915     Chief Complaint  Patient presents with  . Abdominal Pain     (Consider location/radiation/quality/duration/timing/severity/associated sxs/prior Treatment) HPI Comments: H/o GB removal--pain started shortly after she ate Denies anginal sx  Patient is a 81 y.o. female presenting with abdominal pain. The history is provided by the patient and a relative.  Abdominal Pain Pain location:  Epigastric and RUQ Pain quality: sharp   Pain radiates to:  Back Pain severity:  Moderate Onset quality:  Sudden Duration:  4 hours Timing:  Constant Progression:  Worsening Chronicity:  New Context: eating   Context: not retching   Relieved by:  Nothing Worsened by:  Nothing tried Ineffective treatments:  None tried Associated symptoms: no diarrhea, no fever and no vomiting     Past Medical History  Diagnosis Date  . Anemia     iron deficiency  . Shingles 11-09  . COPD (chronic obstructive pulmonary disease) ([place]) 2005    on CXR with R apical scaring  . Atrial fibrillation ([place])     post op  . Hypertension   . Cerebrovascular disease   . S/P aortic valve replacement 08/28/2008    [facility]  . S/P lobectomy of lung 08/28/2008    rt middle lobe  dr. Roxy Manns  . AAA (abdominal aortic aneurysm) ([place])   . CAD (coronary artery disease)   . Hypercholesterolemia     Hx: of  . Headache(784.0)     Hx: of migraines in the past  . Carcinoma of lung ([place]) 08/28/2008    T2N0 moderately diff. squamous cell CA  . Ascending aorta dilatation [facility])    Past Surgical History  Procedure Laterality Date  . Tonsillectomy and adenoidectomy    . U/s guided aspiration of r breast cyst   2009    at the breast clinic  . Rml removed dr Ricard Dillon for lung cancer  08/28/2008    T2N0 squamous cell CA  . Abdominal surgery    . Aortic valve replacement  08/28/2008    [facility]  pericard[facility]  . Colonoscopy      \  . Cataract extraction w/ intraocular lens  implant, bilateral Bilateral   . Dilation and curettage of uterus    . Cardiac catheterization    . Appendectomy    . Cholecystectomy N/A 12/13/2013    Procedure: LAPAROSCOPIC CHOLECYSTECTOMY;  Surgeon: ARalene Ok MD;  [place] [place]  Service: General;  Laterality: N/A;  . Ercp N/A 05/08/2015    Procedure: ENDOSCOPIC RETROGRADE CHOLANGIOPANCREATOGRAPHY (ERCP);  Surgeon: RInda Castle MD;  [place] [facility]  Service: Endoscopy;  Laterality: N/A;  . Ercp N/A 01/13/2016    Procedure: ENDOSCOPIC RETROGRADE CHOLANGIOPANCREATOGRAPHY (ERCP);  Surgeon: HDoran Stabler MD;  [facility];  Service: Endoscopy;  Laterality: N/A;   Family History  Problem Relation Age of Onset  . Stroke Father   . Diabetes Son     brittle diabetes  . Colon polyps Neg Hx   . Colon cancer Neg Hx   . Esophageal cancer Neg Hx   . Stomach cancer Neg Hx   . Pancreatic cancer Neg Hx   . Kidney disease Neg Hx   . Liver disease Neg Hx    Social History  Substance Use Topics  . Smoking status: Former Smoker -- 40 years  . Smokeless tobacco: Never Used  .  Alcohol Use: No   OB History    No data available     Review of Systems  Constitutional: Negative for fever.  Gastrointestinal: Positive for abdominal pain. Negative for vomiting and diarrhea.  All other systems reviewed and are negative.     Allergies  Hyoscyamine; Statins; and Rofecoxib  Home Medications   Prior to Admission medications   Medication Sig Start Date End Date Taking? Authorizing Provider  acetaminophen (TYLENOL) 500 MG tablet Take 500 mg by mouth every 6 (six) hours as needed for moderate pain, fever or headache. Pain     Historical Provider, MD  ALPRAZolam (XANAX) 0.5 MG tablet Take 1 tablet (0.5 mg total) by mouth 2 (two) times daily. Patient taking differently: Take 0.5 mg by mouth 2 (two) times daily as needed.   02/04/16   Hali Marry, MD  aspirin 81 MG tablet Take 81 mg by mouth daily as needed.     Historical Provider, MD  dicyclomine (BENTYL) 20 MG tablet Take 0.5-1 tablets (10-20 mg total) by mouth 2 (two) times daily as needed for spasms. 02/13/16   [name], MD  lisinopril (PRINIVIL,ZESTRIL) 40 MG tablet TAKE 1 TABLET (40 MG TOTAL) BY MOUTH DAILY. 02/04/16   Lelon Perla, MD  metoprolol (LOPRESSOR) 50 MG tablet Take 1.5 tablets (75 mg total) by mouth 2 (two) times daily. 02/04/16   Lelon Perla, MD  ofloxacin (FLOXIN) 0.3 % otic solution Place 5 drops into the right ear 2 (two) times daily. Reported on 02/13/2016    Historical Provider, MD  polyethylene glycol powder (GLYCOLAX/MIRALAX) powder Take 1-2 capfuls daily in water Patient taking differently: Take 1-2 capfuls daily in water as needed 02/04/16   Hali Marry, MD  traMADol (ULTRAM) 50 MG tablet TAKE 1 TABLET BY MOUTH EVERY 8 HOURS AS NEEDED FOR PAIN Patient not taking: Reported on 02/13/2016 02/10/16   Hali Marry, MD  ursodiol (ACTIGALL) 500 MG tablet Take 1 tablet (500 mg total) by mouth daily. 02/13/16   [name], MD   BP 114/56 mmHg  Pulse 61  Temp(Src) 97.6 F (36.4 C) (Oral)  Resp 18  SpO2 97% Physical Exam  Constitutional: She is oriented to person, place, and time. She appears well-developed and well-nourished.  Non-toxic appearance. No distress.  HENT:  Head: Normocephalic and atraumatic.  Eyes: Conjunctivae, EOM and lids are normal. Pupils are equal, round, and reactive to light.  Neck: Normal range of motion. Neck supple. No tracheal deviation present. No thyroid mass present.  Cardiovascular: Normal rate, regular rhythm and normal heart sounds.  Exam reveals no gallop.   No murmur heard. Pulmonary/Chest: Effort normal and breath sounds normal. No stridor. No respiratory distress. She has no decreased breath sounds. She has no wheezes. She has no rhonchi. She has no rales.  Abdominal:  Soft. Normal appearance and bowel sounds are normal. She exhibits no distension. There is tenderness in the right upper quadrant and epigastric area. There is no rebound and no CVA tenderness.    Musculoskeletal: Normal range of motion. She exhibits no edema or tenderness.  Neurological: She is alert and oriented to person, place, and time. She has normal strength. No cranial nerve deficit or sensory deficit. GCS eye subscore is 4. GCS verbal subscore is 5. GCS motor subscore is 6.  Skin: Skin is warm and dry. No abrasion and no rash noted.  Psychiatric: She has a normal mood and affect. Her speech is normal and behavior  is normal.  Nursing note and vitals reviewed.   ED Course  Procedures (including critical care time) Labs Review Labs Reviewed  LIPASE, BLOOD  COMPREHENSIVE METABOLIC PANEL  CBC  URINALYSIS, ROUTINE W REFLEX MICROSCOPIC (NOT AT [facility])    Imaging Review No results found. I have personally reviewed and evaluated these images and lab results as part of my medical decision-making.   EKG Interpretation None      MDM   Final diagnoses:  None    Patient given IV fluids and pain meds here. Abdominal CT results noted and discussed with gastroenterology on-call. Recommended patient be started on IV antibiotics and will be seen tomorrow for possible ERCP    Lacretia Leigh, MD 03/09/16 0006

## 2016-03-09 ENCOUNTER — Encounter (HOSPITAL_COMMUNITY)
Admission: EM | Disposition: A | Discharge status: Home Health | Payer: Self-pay | Source: Home / Self Care | Attending: Internal Medicine

## 2016-03-09 ENCOUNTER — Inpatient Hospital Stay (HOSPITAL_COMMUNITY): Payer: Commercial Managed Care - HMO | Admitting: Certified Registered"

## 2016-03-09 ENCOUNTER — Inpatient Hospital Stay (HOSPITAL_COMMUNITY): Payer: Commercial Managed Care - HMO

## 2016-03-09 ENCOUNTER — Encounter (HOSPITAL_COMMUNITY): Payer: Self-pay | Admitting: Gastroenterology

## 2016-03-09 DIAGNOSIS — K8031 Calculus of bile duct with cholangitis, unspecified, with obstruction: Secondary | ICD-10-CM

## 2016-03-09 DIAGNOSIS — I2510 Atherosclerotic heart disease of native coronary artery without angina pectoris: Secondary | ICD-10-CM | POA: Diagnosis present

## 2016-03-09 DIAGNOSIS — R1011 Right upper quadrant pain: Secondary | ICD-10-CM | POA: Insufficient documentation

## 2016-03-09 DIAGNOSIS — K8050 Calculus of bile duct without cholangitis or cholecystitis without obstruction: Secondary | ICD-10-CM

## 2016-03-09 DIAGNOSIS — Z888 Allergy status to other drugs, medicaments and biological substances status: Secondary | ICD-10-CM | POA: Diagnosis not present

## 2016-03-09 DIAGNOSIS — Z823 Family history of stroke: Secondary | ICD-10-CM | POA: Diagnosis not present

## 2016-03-09 DIAGNOSIS — K830 Cholangitis: Secondary | ICD-10-CM | POA: Diagnosis present

## 2016-03-09 DIAGNOSIS — I712 Thoracic aortic aneurysm, without rupture: Secondary | ICD-10-CM | POA: Diagnosis present

## 2016-03-09 DIAGNOSIS — I10 Essential (primary) hypertension: Secondary | ICD-10-CM | POA: Diagnosis present

## 2016-03-09 DIAGNOSIS — E7800 Pure hypercholesterolemia, unspecified: Secondary | ICD-10-CM | POA: Diagnosis present

## 2016-03-09 DIAGNOSIS — Z954 Presence of other heart-valve replacement: Secondary | ICD-10-CM | POA: Diagnosis not present

## 2016-03-09 DIAGNOSIS — E876 Hypokalemia: Secondary | ICD-10-CM | POA: Diagnosis present

## 2016-03-09 DIAGNOSIS — R945 Abnormal results of liver function studies: Secondary | ICD-10-CM

## 2016-03-09 DIAGNOSIS — Z66 Do not resuscitate: Secondary | ICD-10-CM | POA: Diagnosis present

## 2016-03-09 DIAGNOSIS — Z952 Presence of prosthetic heart valve: Secondary | ICD-10-CM | POA: Diagnosis not present

## 2016-03-09 DIAGNOSIS — Z7982 Long term (current) use of aspirin: Secondary | ICD-10-CM | POA: Diagnosis not present

## 2016-03-09 DIAGNOSIS — Z833 Family history of diabetes mellitus: Secondary | ICD-10-CM | POA: Diagnosis not present

## 2016-03-09 DIAGNOSIS — I4891 Unspecified atrial fibrillation: Secondary | ICD-10-CM | POA: Diagnosis not present

## 2016-03-09 DIAGNOSIS — K8020 Calculus of gallbladder without cholecystitis without obstruction: Secondary | ICD-10-CM | POA: Diagnosis present

## 2016-03-09 DIAGNOSIS — I480 Paroxysmal atrial fibrillation: Secondary | ICD-10-CM | POA: Diagnosis present

## 2016-03-09 DIAGNOSIS — R109 Unspecified abdominal pain: Secondary | ICD-10-CM | POA: Diagnosis present

## 2016-03-09 DIAGNOSIS — I77810 Thoracic aortic ectasia: Secondary | ICD-10-CM | POA: Diagnosis not present

## 2016-03-09 DIAGNOSIS — R7989 Other specified abnormal findings of blood chemistry: Secondary | ICD-10-CM

## 2016-03-09 DIAGNOSIS — F411 Generalized anxiety disorder: Secondary | ICD-10-CM | POA: Diagnosis present

## 2016-03-09 DIAGNOSIS — Z902 Acquired absence of lung [part of]: Secondary | ICD-10-CM | POA: Diagnosis not present

## 2016-03-09 DIAGNOSIS — J449 Chronic obstructive pulmonary disease, unspecified: Secondary | ICD-10-CM | POA: Diagnosis present

## 2016-03-09 DIAGNOSIS — Z85118 Personal history of other malignant neoplasm of bronchus and lung: Secondary | ICD-10-CM | POA: Diagnosis not present

## 2016-03-09 DIAGNOSIS — J9601 Acute respiratory failure with hypoxia: Secondary | ICD-10-CM | POA: Diagnosis present

## 2016-03-09 DIAGNOSIS — A419 Sepsis, unspecified organism: Secondary | ICD-10-CM | POA: Diagnosis present

## 2016-03-09 DIAGNOSIS — Z79899 Other long term (current) drug therapy: Secondary | ICD-10-CM | POA: Diagnosis not present

## 2016-03-09 DIAGNOSIS — Z87891 Personal history of nicotine dependence: Secondary | ICD-10-CM | POA: Diagnosis not present

## 2016-03-09 DIAGNOSIS — I739 Peripheral vascular disease, unspecified: Secondary | ICD-10-CM | POA: Diagnosis present

## 2016-03-09 DIAGNOSIS — I714 Abdominal aortic aneurysm, without rupture: Secondary | ICD-10-CM | POA: Diagnosis present

## 2016-03-09 DIAGNOSIS — Z9049 Acquired absence of other specified parts of digestive tract: Secondary | ICD-10-CM | POA: Diagnosis not present

## 2016-03-09 DIAGNOSIS — Z953 Presence of xenogenic heart valve: Secondary | ICD-10-CM | POA: Diagnosis not present

## 2016-03-09 DIAGNOSIS — Z8673 Personal history of transient ischemic attack (TIA), and cerebral infarction without residual deficits: Secondary | ICD-10-CM | POA: Diagnosis not present

## 2016-03-09 DIAGNOSIS — R0600 Dyspnea, unspecified: Secondary | ICD-10-CM | POA: Diagnosis not present

## 2016-03-09 DIAGNOSIS — R0602 Shortness of breath: Secondary | ICD-10-CM | POA: Diagnosis present

## 2016-03-09 HISTORY — PX: ERCP: SHX5425

## 2016-03-09 LAB — COMPREHENSIVE METABOLIC PANEL
ALBUMIN: 3.2 g/dL — AB (ref 3.5–5.0)
ALT: 243 U/L — ABNORMAL HIGH (ref 14–54)
ALT: 209 U/L — ABNORMAL HIGH (ref 14–54)
ANION GAP: 7 (ref 5–15)
AST: 229 U/L — AB (ref 15–41)
AST: 176 U/L — AB (ref 15–41)
Albumin: 2.9 g/dL — ABNORMAL LOW (ref 3.5–5.0)
Alkaline Phosphatase: 118 U/L (ref 38–126)
Alkaline Phosphatase: 106 U/L (ref 38–126)
Anion gap: 7 (ref 5–15)
BUN: 11 mg/dL (ref 6–20)
BUN: 11 mg/dL (ref 6–20)
CHLORIDE: 98 mmol/L — AB (ref 101–111)
CHLORIDE: 102 mmol/L (ref 101–111)
CO2: 25 mmol/L (ref 22–32)
CO2: 23 mmol/L (ref 22–32)
CREATININE: 0.81 mg/dL (ref 0.44–1.00)
Calcium: 8.4 mg/dL — ABNORMAL LOW (ref 8.9–10.3)
Calcium: 8.1 mg/dL — ABNORMAL LOW (ref 8.9–10.3)
Creatinine, Ser: 0.62 mg/dL (ref 0.44–1.00)
GFR calc Af Amer: >60 mL/min (ref >60)
GFR calc non Af Amer: >60 mL/min (ref >60)
Glucose, Bld: 117 mg/dL — ABNORMAL HIGH (ref 65–99)
Glucose, Bld: 126 mg/dL — ABNORMAL HIGH (ref 65–99)
POTASSIUM: 4 mmol/L (ref 3.5–5.1)
POTASSIUM: 4.1 mmol/L (ref 3.5–5.1)
SODIUM: 130 mmol/L — AB (ref 135–145)
Sodium: 132 mmol/L — ABNORMAL LOW (ref 135–145)
Total Bilirubin: 2.9 mg/dL — ABNORMAL HIGH (ref 0.3–1.2)
Total Bilirubin: 3 mg/dL — ABNORMAL HIGH (ref 0.3–1.2)
Total Protein: 6 g/dL — ABNORMAL LOW (ref 6.5–8.1)
Total Protein: 5.4 g/dL — ABNORMAL LOW (ref 6.5–8.1)

## 2016-03-09 LAB — CBC
HEMATOCRIT: 36.6 % (ref 36.0–46.0)
HEMOGLOBIN: 12.1 g/dL (ref 12.0–15.0)
MCH: 28.6 pg (ref 26.0–34.0)
MCHC: 33.1 g/dL (ref 30.0–36.0)
MCV: 86.5 fL (ref 78.0–100.0)
Platelets: 207 10*3/uL (ref 150–400)
RBC: 4.23 MIL/uL (ref 3.87–5.11)
RDW: 13.7 % (ref 11.5–15.5)
WBC: 27.1 10*3/uL — AB (ref 4.0–10.5)

## 2016-03-09 LAB — TYPE AND SCREEN
ABO/RH(D): O POS
ANTIBODY SCREEN: NEGATIVE

## 2016-03-09 LAB — APTT: APTT: 32 s (ref 24–37)

## 2016-03-09 LAB — PROCALCITONIN: PROCALCITONIN: 8.54 ng/mL

## 2016-03-09 LAB — LIPASE, BLOOD: LIPASE: 22 U/L (ref 11–51)

## 2016-03-09 LAB — LACTIC ACID, PLASMA
LACTIC ACID, VENOUS: 1.2 mmol/L (ref 0.5–2.0)
Lactic Acid, Venous: 2.5 mmol/L (ref 0.5–2.0)

## 2016-03-09 LAB — PROTIME-INR
INR: 1.18 (ref 0.00–1.49)
Prothrombin Time: 14.7 seconds (ref 11.6–15.2)

## 2016-03-09 LAB — ABO/RH: ABO/RH(D): O POS

## 2016-03-09 LAB — GLUCOSE, CAPILLARY: Glucose-Capillary: 108 mg/dL — ABNORMAL HIGH (ref 65–99)

## 2016-03-09 SURGERY — ERCP, WITH INTERVENTION IF INDICATED
Anesthesia: General

## 2016-03-09 MED ORDER — GLYCOPYRROLATE 0.2 MG/ML IJ SOLN
INTRAMUSCULAR | Status: AC
Start: 2016-03-09 — End: 2016-03-09
  Filled 2016-03-09: qty 3

## 2016-03-09 MED ORDER — SODIUM CHLORIDE 0.9 % IV BOLUS (SEPSIS): 500.0000 mL | Freq: Once | INTRAVENOUS | Status: AC

## 2016-03-09 MED ORDER — LIDOCAINE HCL (CARDIAC) 20 MG/ML IV SOLN
INTRAVENOUS | Status: AC
  Filled 2016-03-09: qty 5

## 2016-03-09 MED ORDER — URSODIOL 500 MG PO TABS
500.0000 mg | ORAL_TABLET | Freq: Every day | ORAL | Status: DC
  Filled 2016-03-09: qty 1

## 2016-03-09 MED ORDER — HYDRALAZINE HCL 20 MG/ML IJ SOLN: 5.0000 mg | INTRAMUSCULAR | Status: DC | PRN

## 2016-03-09 MED ORDER — METRONIDAZOLE IN NACL 5-0.79 MG/ML-% IV SOLN
500.0000 mg | Freq: Three times a day (TID) | INTRAVENOUS | Status: DC
  Filled 2016-03-09: qty 100

## 2016-03-09 MED ORDER — METOPROLOL TARTRATE 50 MG PO TABS
75.0000 mg | ORAL_TABLET | Freq: Two times a day (BID) | ORAL | Status: DC
  Filled 2016-03-09 (×2): qty 1

## 2016-03-09 MED ORDER — LORATADINE 10 MG PO TABS
10.0000 mg | ORAL_TABLET | Freq: Every day | ORAL | Status: DC
  Administered 2016-03-09 – 2016-03-14 (×6): 10 mg via ORAL
  Filled 2016-03-09 (×7): qty 1

## 2016-03-09 MED ORDER — CIPROFLOXACIN IN D5W 400 MG/200ML IV SOLN
400.0000 mg | Freq: Two times a day (BID) | INTRAVENOUS | Status: DC
  Filled 2016-03-09: qty 200

## 2016-03-09 MED ORDER — GLUCAGON HCL RDNA (DIAGNOSTIC) 1 MG IJ SOLR
INTRAMUSCULAR | Status: DC | PRN
  Administered 2016-03-09 (×3): .25 mg via INTRAVENOUS

## 2016-03-09 MED ORDER — DICYCLOMINE HCL 20 MG PO TABS
10.0000 mg | ORAL_TABLET | Freq: Two times a day (BID) | ORAL | Status: DC | PRN
  Filled 2016-03-09: qty 1

## 2016-03-09 MED ORDER — METRONIDAZOLE IN NACL 5-0.79 MG/ML-% IV SOLN
500.0000 mg | Freq: Once | INTRAVENOUS | Status: AC
  Administered 2016-03-09: 500 mg via INTRAVENOUS
  Filled 2016-03-09: qty 100

## 2016-03-09 MED ORDER — METOCLOPRAMIDE HCL 5 MG/ML IJ SOLN: 10.0000 mg | Freq: Once | INTRAMUSCULAR | Status: DC | PRN

## 2016-03-09 MED ORDER — SODIUM CHLORIDE 0.9 % IV SOLN: INTRAVENOUS | Status: DC

## 2016-03-09 MED ORDER — ASPIRIN 81 MG PO CHEW: 81.0000 mg | CHEWABLE_TABLET | Freq: Every day | ORAL | Status: DC | PRN

## 2016-03-09 MED ORDER — PSYLLIUM 95 % PO PACK
1.0000 | PACK | Freq: Every day | ORAL | Status: DC
  Administered 2016-03-10 – 2016-03-13 (×4): 1 via ORAL
  Filled 2016-03-09 (×6): qty 1

## 2016-03-09 MED ORDER — METRONIDAZOLE IN NACL 5-0.79 MG/ML-% IV SOLN: 500.0000 mg | Freq: Three times a day (TID) | INTRAVENOUS | Status: DC

## 2016-03-09 MED ORDER — SODIUM CHLORIDE 0.9 % IV SOLN
INTRAVENOUS | Status: DC | PRN
  Administered 2016-03-09: 50 mL

## 2016-03-09 MED ORDER — PIPERACILLIN-TAZOBACTAM 3.375 G IVPB
3.3750 g | Freq: Three times a day (TID) | INTRAVENOUS | Status: DC
  Administered 2016-03-09 – 2016-03-11 (×8): 3.375 g via INTRAVENOUS
  Filled 2016-03-09 (×8): qty 50

## 2016-03-09 MED ORDER — ONDANSETRON HCL 4 MG/2ML IJ SOLN
INTRAMUSCULAR | Status: DC | PRN
  Administered 2016-03-09: 4 mg via INTRAVENOUS

## 2016-03-09 MED ORDER — FENTANYL CITRATE (PF) 250 MCG/5ML IJ SOLN
INTRAMUSCULAR | Status: AC
  Filled 2016-03-09: qty 5

## 2016-03-09 MED ORDER — ONDANSETRON HCL 4 MG/2ML IJ SOLN
INTRAMUSCULAR | Status: AC
  Filled 2016-03-09: qty 2

## 2016-03-09 MED ORDER — MORPHINE SULFATE (PF) 2 MG/ML IV SOLN
2.0000 mg | INTRAVENOUS | Status: DC | PRN
  Filled 2016-03-09 (×3): qty 1

## 2016-03-09 MED ORDER — SODIUM CHLORIDE 0.9 % IV BOLUS (SEPSIS)
2500.0000 mL | Freq: Once | INTRAVENOUS | Status: AC
  Administered 2016-03-09: 2500 mL via INTRAVENOUS

## 2016-03-09 MED ORDER — CIPROFLOXACIN IN D5W 400 MG/200ML IV SOLN
400.0000 mg | Freq: Once | INTRAVENOUS | Status: AC
  Administered 2016-03-09: 400 mg via INTRAVENOUS
  Filled 2016-03-09: qty 200

## 2016-03-09 MED ORDER — SODIUM CHLORIDE 0.9 % IV BOLUS (SEPSIS): 2500.0000 mL | Freq: Once | INTRAVENOUS | Status: DC

## 2016-03-09 MED ORDER — ALPRAZOLAM 0.5 MG PO TABS
0.5000 mg | ORAL_TABLET | Freq: Two times a day (BID) | ORAL | Status: DC | PRN
  Administered 2016-03-09 – 2016-03-14 (×4): 0.5 mg via ORAL
  Filled 2016-03-09 (×4): qty 1

## 2016-03-09 MED ORDER — LACTATED RINGERS IV SOLN
INTRAVENOUS | Status: DC | PRN
  Administered 2016-03-09: 13:00:00 via INTRAVENOUS

## 2016-03-09 MED ORDER — SUCCINYLCHOLINE CHLORIDE 20 MG/ML IJ SOLN
INTRAMUSCULAR | Status: DC | PRN
  Administered 2016-03-09: 100 mg via INTRAVENOUS

## 2016-03-09 MED ORDER — GLUCAGON HCL RDNA (DIAGNOSTIC) 1 MG IJ SOLR
INTRAMUSCULAR | Status: AC
  Filled 2016-03-09: qty 1

## 2016-03-09 MED ORDER — MIDAZOLAM HCL 2 MG/2ML IJ SOLN
INTRAMUSCULAR | Status: AC
  Filled 2016-03-09: qty 2

## 2016-03-09 MED ORDER — TRAMADOL HCL 50 MG PO TABS
50.0000 mg | ORAL_TABLET | Freq: Four times a day (QID) | ORAL | Status: DC | PRN
  Administered 2016-03-09 – 2016-03-12 (×6): 50 mg via ORAL
  Filled 2016-03-09 (×6): qty 1

## 2016-03-09 MED ORDER — FENTANYL CITRATE (PF) 250 MCG/5ML IJ SOLN
INTRAMUSCULAR | Status: DC | PRN
  Administered 2016-03-09: 100 ug via INTRAVENOUS

## 2016-03-09 MED ORDER — SODIUM CHLORIDE 0.9 % IV BOLUS (SEPSIS)
500.0000 mL | Freq: Once | INTRAVENOUS | Status: AC
  Administered 2016-03-09: 500 mL via INTRAVENOUS

## 2016-03-09 MED ORDER — INDOMETHACIN 50 MG RE SUPP
100.0000 mg | Freq: Once | RECTAL | Status: AC
  Administered 2016-03-09: 100 mg via RECTAL
  Filled 2016-03-09: qty 2

## 2016-03-09 MED ORDER — PROPOFOL 10 MG/ML IV BOLUS
INTRAVENOUS | Status: DC | PRN
  Administered 2016-03-09: 130 mg via INTRAVENOUS

## 2016-03-09 MED ORDER — METOPROLOL TARTRATE 25 MG PO TABS
25.0000 mg | ORAL_TABLET | Freq: Two times a day (BID) | ORAL | Status: DC
  Administered 2016-03-09 – 2016-03-10 (×3): 25 mg via ORAL
  Filled 2016-03-09 (×4): qty 1

## 2016-03-09 MED ORDER — NEOSTIGMINE METHYLSULFATE 10 MG/10ML IV SOLN
INTRAVENOUS | Status: AC
  Filled 2016-03-09: qty 1

## 2016-03-09 MED ORDER — LIDOCAINE HCL (CARDIAC) 20 MG/ML IV SOLN
INTRAVENOUS | Status: DC | PRN
  Administered 2016-03-09: 100 mg via INTRAVENOUS
  Administered 2016-03-09: 60 mg via INTRAVENOUS

## 2016-03-09 MED ORDER — ALBUTEROL SULFATE (2.5 MG/3ML) 0.083% IN NEBU
2.5000 mg | INHALATION_SOLUTION | RESPIRATORY_TRACT | Status: DC | PRN
  Administered 2016-03-10 (×2): 2.5 mg via RESPIRATORY_TRACT
  Filled 2016-03-09 (×2): qty 3

## 2016-03-09 MED ORDER — PROPOFOL 10 MG/ML IV BOLUS
INTRAVENOUS | Status: AC
  Filled 2016-03-09: qty 20

## 2016-03-09 MED ORDER — ONDANSETRON HCL 4 MG/2ML IJ SOLN
4.0000 mg | Freq: Three times a day (TID) | INTRAMUSCULAR | Status: DC | PRN
  Filled 2016-03-09: qty 2

## 2016-03-09 MED ORDER — SODIUM CHLORIDE 0.9% FLUSH
3.0000 mL | Freq: Two times a day (BID) | INTRAVENOUS | Status: DC
  Administered 2016-03-10 – 2016-03-14 (×5): 3 mL via INTRAVENOUS

## 2016-03-09 MED ORDER — LISINOPRIL 20 MG PO TABS
40.0000 mg | ORAL_TABLET | Freq: Every day | ORAL | Status: DC
  Filled 2016-03-09: qty 2

## 2016-03-09 NOTE — Progress Notes (Signed)
Pharmacy Antibiotic Note  MINAAL STRUCKMAN is a 81 y.o. female admitted on 03/08/2016 with intra-abdominal infection.   Pharmacy has been consulted for Cipro dosing.  Plan: Cipro '400mg'$  IV q12h F/u cultures  Weight: 158 lb 4.6 oz (71.8 kg)  Temp (24hrs), Avg:97.6 F (36.4 C), Min:97.6 F (36.4 C), Max:97.6 F (36.4 C)   Recent Labs Lab 03/08/16 2003  WBC 15.8*  CREATININE 0.71    Estimated Creatinine Clearance: 56.4 mL/min (by C-G formula based on Cr of 0.71).    Allergies  Allergen Reactions  . Hyoscyamine Other (See Comments)    Mental status changes.  (as of 02/13/16 patient denies any problems with this medication)  . Statins     Muscle aches  . Rofecoxib Rash    Antimicrobials this admission: 4/10 Cipro >>   4/10 Flagyl >>    Dose adjustments this admission:    Microbiology results: 4/10 BCx:    Thank you for allowing pharmacy to be a part of this patient's care.  Everette Rank, PharmD 03/09/2016 12:51 AM

## 2016-03-09 NOTE — Anesthesia Preprocedure Evaluation (Signed)
Anesthesia Evaluation  Patient identified by MRN, date of birth, ID band Patient awake    Reviewed: Allergy & Precautions, H&P , NPO status , Patient's Chart, lab work & pertinent test results, reviewed documented beta blocker date and time   Airway Mallampati: I  TM Distance: >3 FB Neck ROM: Full    Dental  (+) Dental Advisory Given, Caps All front upper are capped:   Pulmonary neg pulmonary ROS, COPD, former smoker,  Lung cancer. S/p lobectomy   Pulmonary exam normal breath sounds clear to auscultation       Cardiovascular hypertension, Pt. on medications and Pt. on home beta blockers + CAD and + Peripheral Vascular Disease  Normal cardiovascular exam+ dysrhythmias Atrial Fibrillation + Valvular Problems/Murmurs AS  Rhythm:Regular Rate:Normal  - Left ventricle: The cavity size was normal. Wall thicknesswas increased in a pattern of moderate LVH. Systolicfunction was normal. The estimated ejection fraction wasin the range of 60% to 65%. Wall motion was normal; therewere no regional wall motion abnormalities. Dopplerparameters are consistent with abnormal left ventricularrelaxation (grade 1 diastolic dysfunction). - Aortic valve: There was a bioprosthetic aortic valve. No significant bioprosthetic aortic valve regurgitation or stenosis. Peak gradient: 74m Hg (S). - Mitral valve: No significant regurgitation. - Left atrium: The atrium was mildly dilated. - Right ventricle: The cavity size was normal. Systolic function was normal. - Pulmonary arteries: No complete TR doppler jet so unableto estimate PA systolic pressure. - Inferior vena cava: The vessel was normal in size; the respirophasic diameter changes were in the normal range (=50%); findings are consistent with normal central venouspressure.  Impressions: - Technically difficult study with poor acoustic windows. Normal LV size with moderate LV hypertrophy. EF  60-65%.Normal RV size and systolic function. There was abioprosthetic aortic valve that appeared to have normalfunction.    Neuro/Psych  Headaches, PSYCHIATRIC DISORDERS Anxiety Cerebrovascular disease    GI/Hepatic negative GI ROS, Neg liver ROS,   Endo/Other  negative endocrine ROS  Renal/GU negative Renal ROS     Musculoskeletal negative musculoskeletal ROS (+)   Abdominal   Peds  Hematology  (+) anemia ,   Anesthesia Other Findings   Reproductive/Obstetrics                             Anesthesia Physical  Anesthesia Plan  ASA: III  Anesthesia Plan: General   Post-op Pain Management:    Induction: Intravenous  Airway Management Planned: Oral ETT  Additional Equipment:   Intra-op Plan:   Post-operative Plan: Extubation in OR  Informed Consent: I have reviewed the patients History and Physical, chart, labs and discussed the procedure including the risks, benefits and alternatives for the proposed anesthesia with the patient or authorized representative who has indicated his/her understanding and acceptance.   Dental advisory given  Plan Discussed with: Surgeon  Anesthesia Plan Comments:         Anesthesia Quick Evaluation

## 2016-03-09 NOTE — Progress Notes (Signed)
OT Cancellation Note  Patient Details Name: Laurie [name] MRN: [id number] DOB: August 17, 1935   Cancelled Treatment:    Reason Eval/Treat Not Completed: Patient at procedure or test/ unavailable  Darlina Rumpf [name], OTR/L [phone]  03/09/2016, 11:46 AM

## 2016-03-09 NOTE — Op Note (Signed)
[facility] Patient Name: Laurie [name] Procedure Date: 03/09/2016 MRN: [id number] Attending MD: Ladene Artist MD, MD Date of Birth: [birth year] CSN:  Age: 81 Admit Type: Inpatient Procedure:                ERCP Indications:              Abdominal pain of suspected biliary origin,                            Suspected bile duct stone(s), Elevated liver enzymes Providers:                Pricilla Riffle. Fuller Plan MD, MD, Dortha Schwalbe, RN, Alfonso Patten, Technician, Glenis Smoker, CRNA Referring MD:              Medicines:                General Anesthesia Complications:            No immediate complications. Estimated Blood Loss:     Estimated blood loss: none. Procedure:                Pre-Anesthesia Assessment:                           - Prior to the procedure, a History and Physical                            was performed, and patient medications and                            allergies were reviewed. The patient's tolerance of                            previous anesthesia was also reviewed. The risks                            and benefits of the procedure and the sedation                            options and risks were discussed with the patient.                            All questions were answered, and informed consent                            was obtained. Prior Anticoagulants: The patient has                            taken no previous anticoagulant or antiplatelet                            agents. ASA Grade Assessment: III - A patient with  severe systemic disease. After reviewing the risks                            and benefits, the patient was deemed in                            satisfactory condition to undergo the procedure.                           After obtaining informed consent, the scope was                            passed under direct vision. Throughout the   procedure, the patient's blood pressure, pulse, and                            oxygen saturations were monitored continuously. The                            [id number] ([id number]) scope was introduced through                            the mouth, and used to inject contrast into and                            used to inject contrast into the bile duct. The                            ERCP was accomplished without difficulty. The                            patient tolerated the procedure well. Scope In: Scope Out: Findings:      The scout film was normal. The esophagus was successfully intubated       under direct vision. The scope was advanced to major papilla in the       descending duodenum without detailed examination of the pharynx, larynx       and associated structures, and upper GI tract. The upper GI tract was       grossly normal. There was a large periampullary diverticulum with the       major papilla at the 10 oclock position on the inner edge on the       diverticulum. Prior sphicterotomy incision noted. The bile duct was       freely and deeply cannulated and a guidewire was passed. Contrast was       injected. I personally interpreted the bile duct images. There was brisk       flow of contrast through the ducts. The right intrahepatic ducts were       not well filled. The common bile duct was diffusely dilated, acquired.       The largest diameter was 19 mm. Air bubbles were visualized in the       common bile duct and right intrahepatic branches. The common bile duct       contained filling defects thought to be sludge. The biliary tree was       swept  with an 18 mm balloon starting at the bifurcation. Sludge and pus       were swept from the duct. The final occlusion cholangiogram did not show       any suspicious filling defects althought air bubbles remained. The PD       was not cannulated or injected by intention. Impression:               - Pneumobilia.                            - CBD sludge.                           - Cholangitis.                           - The common bile duct and intrahepatic ducts were                            dilated, acquired.                           - The biliary tree was swept and sludge and pus                            were found.                           - Prior sphincterotomy incision.                           - Large periampullary diverticulum. Moderate Sedation:      N/A- Per Anesthesia Care Recommendation:           - Observe patient's clinical course.                           - Continue IV antibiotics and trend LFTs. Procedure Code(s):        --- Professional ---                           [phone], Endoscopic retrograde                            cholangiopancreatography (ERCP); with removal of                            calculi/debris from biliary/pancreatic duct(s)                           [phone], Endoscopic catheterization of the biliary                            ductal system, radiological supervision and                            interpretation Diagnosis Code(s):        --- Professional ---  R10.9, Unspecified abdominal pain                           R74.8, Abnormal levels of other serum enzymes                           K83.8, Other specified diseases of biliary tract                           R93.2, Abnormal findings on diagnostic imaging of                            liver and biliary tract CPT copyright 2016 [employer]. All rights reserved. The codes documented in this report are preliminary and upon coder review may  be revised to meet current compliance requirements. Ladene Artist MD, MD 03/09/2016 2:33:33 PM This report has been signed electronically. Number of Addenda: 0

## 2016-03-09 NOTE — Anesthesia Postprocedure Evaluation (Signed)
Anesthesia Post Note  Patient: Laurie [name]  Procedure(s) Performed: Procedure(s) (LRB): ENDOSCOPIC RETROGRADE CHOLANGIOPANCREATOGRAPHY (ERCP) (N/A)  Patient location during evaluation: Endoscopy Anesthesia Type: General Level of consciousness: awake and alert Pain management: pain level controlled Vital Signs Assessment: post-procedure vital signs reviewed and stable Respiratory status: spontaneous breathing, nonlabored ventilation, respiratory function stable and patient connected to nasal cannula oxygen Cardiovascular status: blood pressure returned to baseline and stable Postop Assessment: no signs of nausea or vomiting Anesthetic complications: no    Last Vitals:  Filed Vitals:   03/09/16 1440 03/09/16 1450  BP: 138/54 139/55  Pulse: 76 72  Temp:    Resp: 15 17    Last Pain:  Filed Vitals:   03/09/16 1453  PainSc: 4                  Montez Hageman

## 2016-03-09 NOTE — Transfer of Care (Signed)
Immediate Anesthesia Transfer of Care Note  Patient: Laurie [name]  Procedure(s) Performed: Procedure(s): ENDOSCOPIC RETROGRADE CHOLANGIOPANCREATOGRAPHY (ERCP) (N/A)  Patient Location: PACU  Anesthesia Type:General  Level of Consciousness: sedated  Airway & Oxygen Therapy: Patient Spontanous Breathing and Patient connected to nasal cannula oxygen  Post-op Assessment: Report given to RN and Post -op Vital signs reviewed and stable  Post vital signs: Reviewed and stable  Last Vitals:  Filed Vitals:   03/09/16 1159 03/09/16 1212  BP: 148/106 142/51  Pulse: 72   Temp: 36.6 C   Resp: 28     Complications: No apparent anesthesia complications

## 2016-03-09 NOTE — Evaluation (Signed)
Physical Therapy Evaluation Patient Details Name: Laurie [name] MRN: [id number] DOB: Feb 13, 1935 Today's Date: 03/09/2016   History of Present Illness  81 yo female admitted with abd pain, sepsis. Hx of HTN, lung cancer, AAA, HTN, cholecystectomy 2015, Afib, anxiety, fall risk, COPD.   Clinical Impression  On eval, pt required Min assist for mobility-walked ~150 feet without an assistive device. O2 sats dropped to 86% on RA. Returned to room and replaced [place] O2-sats recovered to 91%. Will follow and progress activity as able during hospital stay. Pt may need to start using RW, depending on progress. Recommend HHPT if pt will agree. Per pt, son is able to help with household tasks.    Follow Up Recommendations Home health PT;Supervision/Assistance - Intermittent    Equipment Recommendations  None recommended by PT    Recommendations for Other Services       Precautions / Restrictions Precautions Precautions: Fall Precaution Comments: monitor sats Restrictions Weight Bearing Restrictions: No      Mobility  Bed Mobility Overal bed mobility: Needs Assistance Bed Mobility: Supine to Sit;Sit to Supine     Supine to sit: Supervision Sit to supine: Supervision   General bed mobility comments: for safety  Transfers Overall transfer level: Needs assistance   Transfers: Sit to/from Stand Sit to Stand: Min guard         General transfer comment: close guard for safety.   Ambulation/Gait Ambulation/Gait assistance: Min assist Ambulation Distance (Feet): 150 Feet Assistive device: None Gait Pattern/deviations: Step-through pattern;Decreased stride length;Drifts right/left;Staggering right;Staggering left     General Gait Details: unsteady. required intermittent assist to stabilize. pt became less steady, dyspneic, weaker as distance increased. Checked O2 sats after ~75 feet-86% on RA.   Stairs            Wheelchair Mobility    Modified Rankin (Stroke Patients  Only)       Balance Overall balance assessment: Needs assistance         Standing balance support: During functional activity Standing balance-Leahy Scale: Fair                               Pertinent Vitals/Pain Pain Assessment: Faces Faces Pain Scale: Hurts little more Pain Location: abdomen Pain Intervention(s): Monitored during session;Repositioned    Home Living Family/patient expects to be discharged to:: Private residence Living Arrangements: Children (son)   Type of Home: House Home Access: Stairs to enter   Technical brewer of Steps: 2 Home Layout: One level Home Equipment: Environmental consultant - 2 wheels      Prior Function Level of Independence: Independent               Hand Dominance        Extremity/Trunk Assessment   Upper Extremity Assessment: Defer to OT evaluation           Lower Extremity Assessment: Generalized weakness      Cervical / Trunk Assessment: Normal  Communication   Communication: No difficulties  Cognition Arousal/Alertness: Awake/alert Behavior During Therapy: WFL for tasks assessed/performed Overall Cognitive Status: Within Functional Limits for tasks assessed                      General Comments      Exercises        Assessment/Plan    PT Assessment Patient needs continued PT services  PT Diagnosis Difficulty walking;Generalized weakness   PT Problem List Decreased strength;Decreased  activity tolerance;Decreased balance;Decreased mobility;Decreased knowledge of use of DME  PT Treatment Interventions Gait training;Functional mobility training;Patient/family education;Balance training;Therapeutic exercise;DME instruction   PT Goals (Current goals can be found in the Care Plan section) Acute Rehab PT Goals Patient Stated Goal: to get stronger. get rid of abd pain. PT Goal Formulation: With patient Time For Goal Achievement: 03/23/16 Potential to Achieve Goals: Good    Frequency Min  3X/week   Barriers to discharge        Co-evaluation               End of Session Equipment Utilized During Treatment: Gait belt Activity Tolerance: Patient limited by fatigue (Limited by dyspnea, drop in O2 sats) Patient left: in bed;with call bell/phone within reach;with bed alarm set           Time: 0459-9774 PT Time Calculation (min) (ACUTE ONLY): 16 min   Charges:   PT Evaluation $PT Eval Low Complexity: 1 Procedure     PT G Codes:        Weston Anna, MPT Pager: [phone]

## 2016-03-09 NOTE — Anesthesia Procedure Notes (Signed)
Procedure Name: Intubation Date/Time: 03/09/2016 1:43 PM Performed by: Cynda Familia Pre-anesthesia Checklist: Patient identified, Emergency Drugs available, Suction available and Patient being monitored Patient Re-evaluated:Patient Re-evaluated prior to inductionOxygen Delivery Method: Circle System Utilized Preoxygenation: Pre-oxygenation with 100% oxygen Intubation Type: IV induction Ventilation: Mask ventilation without difficulty Laryngoscope Size: Miller and 2 Grade View: Grade I Tube type: Oral Number of attempts: 1 Airway Equipment and Method: Stylet Placement Confirmation: ETT inserted through vocal cords under direct vision,  positive ETCO2 and breath sounds checked- equal and bilateral Tube secured with: Tape Dental Injury: Teeth and Oropharynx as per pre-operative assessment  Comments: IV induction [place]- pt complains of pain in IV after fentayl administration- to change IV after induction as per Marcell Barlow-  Intubation AM CRNA atraumatic teeth and mouth as preop- ET position approved by [employer]

## 2016-03-09 NOTE — Progress Notes (Signed)
CRITICAL VALUE ALERT  Critical value received: lactic acid 2.5 Date of notification:  03/09/16  Time of notification:  0318  Critical value read back:Yes.    Nurse who received alert:  Jacklynn Ganong  MD notified (1st page):  Rogue Bussing  Time of first page:  0320  MD notified (2nd page):  Time of second page:  Responding MD:  Rosetta Posner  Time MD responded:  [phone] and [phone]

## 2016-03-09 NOTE — Progress Notes (Addendum)
Patient seen and examined  81 y.o. female with PMH of hypertension, hyperlipidemia, COPD, anxiety, atrial fibrillation not on anticoagulants possibly due to old age and high risk of fall, aortic valve replacement, lung cancer with lobectomy 2009, AAA, ascending aortic dilation, S/P cholecystectomy, who presents with abdominal pain. Admitted for suspected ascending cholangitis  Assessment and plan Abdominal pain and sepsis: Ascending cholangitis secondary to [place] to cholelithiasis CT-abd/pelvis showed periportal edema, diffuse pneumobilia, dlatation of the common bile duct to 1.9 cm in diameter, indicating common duct obstruction in the biliary infection. Patient has leukocytosis, lactate 2.5 and RR=20, meeting criteria of sepsis. Pending lactate. Hemodynamically stable. GI was consulted by EDP, will see in AM. Previous hospitalization complicated by gram-negative bacteremia Continue telemetry Cipro and Flagyl switch to Zosyn Pro-calcitonin 8.54 -IVF: 2.5 NS, then 125 cc/h.  -Zofran for nausea and morphine for pain Patient evaluated by gastroenterology-decision about the MRCP versus ERCP pending  HTN: -continue metoprolol -hold lisinopril since patient is at risk of developing hypotension secondary to sepsis -IV hydralazine when necessary  Anxiety:  -Continue home medications: Xanax  COPD: stable. Lung clear to auscultation -Albuterol nebulizers when necessary

## 2016-03-09 NOTE — H&P (Addendum)
[facility] History and Physical  Laurie [name] YKD:[id number] DOB: 10/20/1935 DOA: 03/08/2016  Referring physician: ED physician PCP: Beatrice Lecher, MD  Specialists:   Chief Complaint: Abdominal pain  HPI: Laurie [name] is a 81 y.o. female with PMH of hypertension, hyperlipidemia, COPD, anxiety, atrial fibrillation not on anticoagulants possibly due to old age and high risk of fall, aortic valve replacement, lung cancer with lobectomy 2009, AAA, ascending aortic dilation, S/P cholecystectomy, who presents with abdominal pain.  The patient reported she started having abdominal pain today. It is located to the right upper quadrant, moderate, constant, nonradiating. It is not associated with fever, chills, nausea, vomiting or diarrhea. Patient does not have chest pain, shortness breath, symptoms of UTI or unilateral weakness.   In ED, patient was found to have WBC 15.8, negative troponin, BNP 115, lipase 21, lactate 2.5, negative urinalysis, tachypnea, no tachycardia, sodium 132, creatinine 0.71. Abnormal liver function with AST 323, ALT 234, total bilirubin 1.8. Oxygen desaturation to 83% which responded to non-breather and nasal cannula and improved to oxygen 100%. Patient is admitted to inpatient for further intervention treatment. GI was consulted by EDP, with in the morning.  # CT-abd/pelvis: Periportal edema noted. Diffuse pneumobilia seen. Dilatation of the common bile duct to 1.9 cm in diameter; mild aneurysmal dilatation of the infrarenal abdominal aorta to 3.4 cm in transverse dimension. Scattered calcification along the abdominal aorta and its branches; scattered prominent periuterine vessels could reflect pelvic congestion syndrome in the appropriate clinical situation; bibasilar scarring, with trace right-sided pleural fluid, 7 mm nodule at the left lung base; scattered diverticulosis along the transverse, descending and sigmoid colon, without evidence of  diverticulitis.  # CT-chest showed interlobular septal thickening throughout both lungs which is new, likely due to interstitial fluid. This could represent CHF as the vasculature also appears moderately prominent; Stable left lower lobe posterior base nodule. Unchanged dilatation of the thoracic aorta.   EKG:  Not done in ED, will get one.   Where does patient live?   At home   SNF    Assistant living facility   [facility] Can patient participate in ADLs?  Yes     Barely     None  Little     Some   Review of Systems:   General: no fevers, chills, no changes in body weight, has poor appetite, has fatigue HEENT: no blurry vision, hearing changes or sore throat Pulm: no dyspnea, coughing, wheezing CV: no chest pain, no palpitations Abd: has abdominal pain, but no nausea, vomiting, diarrhea, constipation GU: no dysuria, burning on urination, increased urinary frequency, hematuria  Ext: no leg edema Neuro: no unilateral weakness, numbness, or tingling, no vision change or hearing loss Skin: no rash MSK: No muscle spasm, no deformity, no limitation of range of movement in spin Heme: No easy bruising.  Travel history: No recent long distant travel.  Allergy:  Allergies  Allergen Reactions  . Hyoscyamine Other (See Comments)    Mental status changes.  (as of 02/13/16 patient denies any problems with this medication)  . Statins     Muscle aches  . Rofecoxib Rash    Past Medical History  Diagnosis Date  . Anemia     iron deficiency  . Shingles 11-09  . COPD (chronic obstructive pulmonary disease) ([place]) 2005    on CXR with R apical scaring  . Atrial fibrillation ([place])     post op  . Hypertension   . Cerebrovascular disease   . S/P  aortic valve replacement 08/28/2008    [facility]  . S/P lobectomy of lung 08/28/2008    rt middle lobe  dr. Roxy Manns  . AAA (abdominal aortic aneurysm) ([place])   . CAD (coronary artery disease)   . Hypercholesterolemia     Hx: of  . Headache(784.0)      Hx: of migraines in the past  . Carcinoma of lung ([place]) 08/28/2008    T2N0 moderately diff. squamous cell CA  . Ascending aorta dilatation [facility])     Past Surgical History  Procedure Laterality Date  . Tonsillectomy and adenoidectomy    . U/s guided aspiration of r breast cyst   2009    at the breast clinic  . Rml removed dr Ricard Dillon for lung cancer  08/28/2008    T2N0 squamous cell CA  . Abdominal surgery    . [facility]ortic valve [facility]  [facility]  . Colonoscopy      \  . Cataract extraction w/ intraocular lens  implant, bilateral Bilateral   . Dilation and curettage of uterus    . Cardiac catheterization    . Appendectomy    . Cholecystectomy N/A 12/13/2013    Procedure: LAPAROSCOPIC CHOLECYSTECTOMY;  Surgeon: ARalene Ok MD;  [place]  Service: General;  Laterality: N/A;  . Ercp N/A 05/08/2015    Procedure: ENDOSCOPIC RETROGRADE CHOLANGIOPANCREATOGRAPHY (ERCP);  Surgeon: RInda Castle MD;  [place]  Service: Endoscopy;  Laterality: N/A;  . Ercp N/A 01/13/2016    Procedure: ENDOSCOPIC RETROGRADE CHOLANGIOPANCREATOGRAPHY (ERCP);  Surgeon: HDoran Stabler MD;  [facility];  Service: Endoscopy;  Laterality: N/A;    Social History:  reports that she has quit smoking. She has never used smokeless tobacco. She reports that she does not drink alcohol or use illicit drugs.  Family History:  Family History  Problem Relation Age of Onset  . Stroke Father   . Diabetes Son     brittle diabetes  . Colon polyps Neg Hx   . Colon cancer Neg Hx   . Esophageal cancer Neg Hx   . Stomach cancer Neg Hx   . Pancreatic cancer Neg Hx   . Kidney disease Neg Hx   . Liver disease Neg Hx      Prior to Admission medications   Medication Sig Start Date End Date Taking? Authorizing Provider  acetaminophen (TYLENOL) 500 MG tablet Take 500 mg by mouth every 6 (six) hours as needed for moderate pain, fever  or headache. Pain    Yes Historical Provider, MD  ALPRAZolam (XANAX) 0.5 MG tablet Take 1 tablet (0.5 mg total) by mouth 2 (two) times daily. Patient taking differently: Take 0.5 mg by mouth 2 (two) times daily as needed.  02/04/16  Yes CHali Marry MD  aspirin 81 MG tablet Take 81 mg by mouth daily as needed for pain.    Yes Historical Provider, MD  dicyclomine (BENTYL) 20 MG tablet Take 0.5-1 tablets (10-20 mg total) by mouth 2 (two) times daily as needed for spasms. 02/13/16  Yes HNelida MeuseIII, MD  lisinopril (PRINIVIL,ZESTRIL) 40 MG tablet TAKE 1 TABLET (40 MG TOTAL) BY MOUTH DAILY. 02/04/16  Yes BLelon Perla MD  metoprolol (LOPRESSOR) 50 MG tablet Take 1.5 tablets (75 mg total) by mouth 2 (two) times daily. 02/04/16  Yes BLelon Perla MD  psyllium (METAMUCIL) 58.6 % packet Take 1 packet by mouth daily.   Yes Historical  Provider, MD  traMADol (ULTRAM) 50 MG tablet TAKE 1 TABLET BY MOUTH EVERY 8 HOURS AS NEEDED FOR PAIN Patient taking differently: TAKE 50 MG BY MOUTH EVERY 8 HOURS AS NEEDED FOR PAIN 02/10/16  Yes Hali Marry, MD  ursodiol (ACTIGALL) 500 MG tablet Take 1 tablet (500 mg total) by mouth daily. 02/13/16  Yes [name], MD  polyethylene glycol powder (GLYCOLAX/MIRALAX) powder Take 1-2 capfuls daily in water Patient not taking: Reported on 03/08/2016 02/04/16   Hali Marry, MD    Physical Exam: Filed Vitals:   03/08/16 2030 03/08/16 2130 03/08/16 2200 03/09/16 0050  BP: 163/70 163/69 143/59   Pulse: 77 85 84   Temp:      TempSrc:      Resp:   20   Weight:    71.8 kg (158 lb 4.6 oz)  SpO2: 98% 100% 100%    General: Not in acute distress HEENT:       Eyes: PERRL, EOMI, no scleral icterus.       ENT: No discharge from the ears and nose, no pharynx injection, no tonsillar enlargement.        Neck: No JVD, no bruit, no mass felt. Heme: No neck lymph node enlargement. Cardiac: S1/S2, RRR, No murmurs, No gallops or rubs. Pulm:  No rales,  wheezing, rhonchi or rubs. Abd: Soft, nondistended, tenderness over RUQ, no rebound pain, no organomegaly, BS present. Ext: No pitting leg edema bilaterally. 2+DP/PT pulse bilaterally. Musculoskeletal: No joint deformities, No joint redness or warmth, no limitation of ROM in spin. Skin: No rashes.  Neuro: Alert, oriented X3, cranial nerves II-XII grossly intact, moves all extremities normally. Psych: Patient is not psychotic, no suicidal or hemocidal ideation.  Labs on Admission:  Basic Metabolic Panel:  Recent Labs Lab 03/08/16 2003  NA 132*  K 3.8  CL 95*  CO2 26  GLUCOSE 140*  BUN 11  CREATININE 0.71  CALCIUM 9.2   Liver Function Tests:  Recent Labs Lab 03/08/16 2003  AST 323*  ALT 234*  ALKPHOS 123  BILITOT 1.8*  PROT 7.0  ALBUMIN 3.9    Recent Labs Lab 03/08/16 2003  LIPASE 21   No results for input(s): AMMONIA in the last 168 hours. CBC:  Recent Labs Lab 03/08/16 2003  WBC 15.8*  HGB 14.6  HCT 42.9  MCV 84.8  PLT 249   Cardiac Enzymes:  Recent Labs Lab 03/08/16 2159  TROPONINI <0.03    BNP (last 3 results)  Recent Labs  03/08/16 2159  BNP 115.5*    ProBNP (last 3 results) No results for input(s): PROBNP in the last 8760 hours.  CBG: No results for input(s): GLUCAP in the last 168 hours.  Radiological Exams on Admission: Ct Chest W Contrast  03/08/2016  CLINICAL DATA:  Mild dyspnea with upper abdominal pain. EXAM: CT CHEST WITH CONTRAST TECHNIQUE: Multidetector CT imaging of the chest was performed during intravenous contrast administration. CONTRAST:  49m ISOVUE-300 IOPAMIDOL (ISOVUE-300) INJECTION 61% COMPARISON:  08/15/2015 FINDINGS: There is stable postsurgical irregularity on the right. There is a noncalcified 5 x 6 mm nodule in the left lower lobe posterior base. This has been present over the past several examinations dating back to at least 02/17/2010, when it measured 5.0 mm. No suspicious nodules are evident. There is  prominent interlobular septal thickening throughout both lungs. This is new from 08/15/2015 and hit likely represents interstitial fluid. The vasculature also appears moderately prominent. This may represent congestive heart failure.  No frank alveolar edema. No effusions. There is mild unchanged pleural thickening of the right posterior hemi thorax. There is no pathologic adenopathy.  Airways are patent. Upper abdomen is notable for pneumobilia, unchanged. There is unchanged dilatation of the thoracic aorta, measuring 4.4 cm in the ascending aorta and 3.8 cm in the descending. No dissection or other acute vascular abnormality. IMPRESSION: 1. Interlobular septal thickening throughout both lungs which is new, likely due to interstitial fluid. This could represent CHF as the vasculature also appears moderately prominent. 2. Stable left lower lobe posterior base nodule. No suspicious nodules. 3. Unchanged dilatation of the thoracic aorta. Electronically Signed   By: Andreas Newport M.D.   On: 03/08/2016 21:44   Ct Abdomen Pelvis W Contrast  03/08/2016  CLINICAL DATA:  Acute onset of mid abdominal pain. Initial encounter. EXAM: CT ABDOMEN AND PELVIS WITH CONTRAST TECHNIQUE: Multidetector CT imaging of the abdomen and pelvis was performed using the standard protocol following bolus administration of intravenous contrast. CONTRAST:  60m ISOVUE-300 IOPAMIDOL (ISOVUE-300) INJECTION 61% COMPARISON:  CTA of the chest, abdomen and pelvis performed 08/15/2015 FINDINGS: Bibasilar scarring is noted, with trace right-sided pleural fluid. A 7 mm nodule is noted at the left lung base. Periportal edema is noted. Diffuse pneumobilia is seen. There is dilatation of the common bile duct to 1.9 cm in diameter. Would correlate for any evidence of postcholecystectomy syndrome. The liver is otherwise grossly unremarkable. The spleen is unremarkable in appearance. The patient is status post cholecystectomy. The pancreas and adrenal  glands are grossly unremarkable. The kidneys are unremarkable in appearance. There is no evidence of hydronephrosis. No renal or ureteral stones are seen. No perinephric stranding is appreciated. No free fluid is identified. The small bowel is unremarkable in appearance. The stomach is within normal limits. No acute vascular abnormalities are seen. There is mild aneurysmal dilatation of the infrarenal abdominal aorta to 3.4 cm in transverse dimension. Scattered calcification is noted along the abdominal aorta and its branches. The patient is status post appendectomy. Scattered diverticulosis is noted along the transverse, descending and sigmoid colon, without evidence of diverticulitis. The bladder is moderately distended and partially filled with contrast. The uterus is grossly unremarkable. Scattered prominent periuterine vessels could reflect pelvic congestion syndrome in the appropriate clinical situation. The ovaries are relatively symmetric. No inguinal lymphadenopathy is seen. No acute osseous abnormalities are identified. IMPRESSION: 1. Periportal edema noted. Diffuse pneumobilia seen. Dilatation of the common bile duct to 1.9 cm in diameter. Would correlate for any evidence of postcholecystectomy syndrome. 2. Mild aneurysmal dilatation of the infrarenal abdominal aorta to 3.4 cm in transverse dimension. Scattered calcification along the abdominal aorta and its branches. Recommend followup by ultrasound in 3 years. This recommendation follows ACR consensus guidelines: White Paper of the ACR Incidental Findings Committee II on Vascular Findings. J Am Coll Radiol 2013; 108:657-8463. Scattered prominent periuterine vessels could reflect pelvic congestion syndrome in the appropriate clinical situation. 4. Bibasilar scarring, with trace right-sided pleural fluid. 7 mm nodule at the left lung base. It is difficult to determine whether this has changed in size since the prior study. Non-contrast chest CT at 6-12  months is recommended. If the nodule is stable at time of repeat CT, then future CT at 18-24 months (from today's scan) is considered optional for low-risk patients, but is recommended for high-risk patients. This recommendation follows the consensus statement: Guidelines for Management of Incidental Pulmonary Nodules Detected on CT Images:From the Fleischner Society 2017; published online before print (  10.1148/[id number]). 5. Scattered diverticulosis along the transverse, descending and sigmoid colon, without evidence of diverticulitis. Electronically Signed   By: Garald Balding M.D.   On: 03/08/2016 23:46    Assessment/Plan Principal Problem:   Abdominal pain Active Problems:   GAD (generalized anxiety disorder)   Essential hypertension, benign   S/P aortic valve replacement   S/P lobectomy of lung   Abdominal aortic aneurysm (HCC)   Ascending aorta dilatation (HCC)   Thoracic aortic aneurysm without rupture (HCC)   HTN (hypertension)   Sepsis (HCC)  Abdominal pain and sepsis: CT-abd/pelvis showed periportal edema, diffuse pneumobilia, dlatation of the common bile duct to 1.9 cm in diameter, indicating common duct obstruction in the biliary infection. Patient has leukocytosis, lactate 2.5 and RR=20, meeting criteria of sepsis. Pending lactate. Hemodynamically stable. GI was consulted by EDP, will see in AM.  -will admit to tele bed  -Cipro and flagyl were started in ED, will continue -will get Procalcitonin and trend lactic acid levels per sepsis protocol. -IVF: 2.5 NS, then 125 cc/h.  -Zofran for nausea and morphine for pain  HTN: -continue metoprolol -hold lisinopril since patient is at risk of developing hypotension secondary to sepsis -IV hydralazine when necessary  Anxiety:  -Continue home medications: Xanax  COPD: stable. Lung clear to auscultation -Albuterol nebulizers when necessary  DVT ppx: SCD  Code Status: DNR Family Communication: None at bed side.    Disposition Plan: Admit to inpatient   Date of Service 03/09/2016    Ivor Costa [facility] Pager [phone]  If 7PM-7AM, please contact night-coverage [email or web address] Password [email or web address] 03/09/2016, 1:14 AM

## 2016-03-09 NOTE — Consult Note (Signed)
Referring Provider: No ref. provider found Primary Care Physician:  Beatrice Lecher, MD Primary Gastroenterologist:  Dr. Loletha Carrow  Reason for Consultation:  ? Cholangitis/CBD stones  HPI: Laurie [name] is a 81 y.o. female who has a history of aortic stenosis status post aortic valve replacement in September 2009. She has a pericardial tissue valve. She also has had a septal myectomy and a right middle lobectomy due to lung cancer. She has a history of COPD, postoperative atrial fibrillation, hypertension, CVA, abdominal aortic aneurysm, iliac aneurysm, and coronary artery disease. In 2005 she was evaluated by Dr. Liane Comber in [place] due to anemia. She had a colonoscopy, upper endoscopy, and pill endoscopy and was found to have AVMs. It was at that time she was diagnosed with aortic stenosis and subsequently had a aortic valve replacement.  In January 2015 she had a laparoscopic cholecystectomy by Dr. Rosendo Gros. She was evaluated in the GI office in May 2016 with ongoing epigastric pain. She had been evaluated by Dr. Stanford Breed for follow-up of her aneurysm and had had a CTA of the abdomen and pelvis that revealed intra-and extrahepatic biliary ductal dilatation with slight increased diameter of the CBD to 1.4 cm with abrupt distal tapering felt to possibly represent a stricture. She underwent an ERCP by Dr. Deatra Ina on 05/08/2015 and was noted to have a large periampullary diverticulum. No stones were noted.  Was feeling well until February of this year when she presented with CBD stones; underwent ERCP on 2/13 by Dr. Loletha Carrow where she had large CBD stones removed and possible cholangitis, sphincterotomy was extended.    Presented back to [facility] ED on 4/9 with abdominal pain that began the same day as presentation.  In ED, patient was found to have WBC 15.8, negative troponin, BNP 115, lipase 21, lactate 2.5, negative urinalysis, tachypnea, no tachycardia, sodium 132, creatinine 0.71. Abnormal liver  function with AST 323, ALT 234, total bilirubin 1.8 (previously had normalized after last ERCP). Oxygen desaturation to 83% which responded to non-breather and nasal cannula and improved to oxygen 100%.   # CT-abd/pelvis: Periportal edema noted. Diffuse pneumobilia seen. Dilatation of the common bile duct to 1.9 cm in diameter; mild aneurysmal dilatation of the infrarenal abdominal aorta to 3.4 cm in transverse dimension. Scattered calcification along the abdominal aorta and its branches; scattered prominent periuterine vessels could reflect pelvic congestion syndrome in the appropriate clinical situation; bibasilar scarring, with trace right-sided pleural fluid, 7 mm nodule at the left lung base; scattered diverticulosis along the transverse, descending and sigmoid colon, without evidence of diverticulitis.  # CT-chest showed interlobular septal thickening throughout both lungs which is new, likely due to interstitial fluid. This could represent CHF as the vasculature also appears moderately prominent; Stable left lower lobe posterior base nodule. Unchanged dilatation of the thoracic aorta.  Total bili continues to increase to 3.0 today, although other LFT's are trending down slightly.  WBC count increased to 27.2 today.  Is on Zosyn.   Past Medical History  Diagnosis Date  . Anemia     iron deficiency  . Shingles 11-09  . COPD (chronic obstructive pulmonary disease) ([place]) 2005    on CXR with R apical scaring  . Atrial fibrillation ([place])     post op  . Hypertension   . Cerebrovascular disease   . S/P aortic valve replacement 08/28/2008    [facility]  . S/P lobectomy of lung 08/28/2008    rt middle lobe  dr. Roxy Manns  . AAA (abdominal aortic aneurysm) ([place])   .  CAD (coronary artery disease)   . Hypercholesterolemia     Hx: of  . Headache(784.0)     Hx: of migraines in the past  . Carcinoma of lung ([place]) 08/28/2008    T2N0 moderately diff. squamous cell CA  . Ascending aorta dilatation [facility])       Past Surgical History  Procedure Laterality Date  . Tonsillectomy and adenoidectomy    . U/s guided aspiration of r breast cyst   2009    at the breast clinic  . Rml removed dr Ricard Dillon for lung cancer  08/28/2008    T2N0 squamous cell CA  . Abdominal surgery    . Aortic valve replacement  08/28/2008    [facility]  . Colonoscopy      \  . Cataract extraction w/ intraocular lens  implant, bilateral Bilateral   . Dilation and curettage of uterus    . Cardiac catheterization    . Appendectomy    . Cholecystectomy N/A 12/13/2013    Procedure: LAPAROSCOPIC CHOLECYSTECTOMY;  Surgeon: ARalene Ok MD;  [facility]  Service: General;  Laterality: N/A;  . Ercp N/A 05/08/2015    Procedure: ENDOSCOPIC RETROGRADE CHOLANGIOPANCREATOGRAPHY (ERCP);  Surgeon: RInda Castle MD;  [facility]  Service: Endoscopy;  Laterality: N/A;  . Ercp N/A 01/13/2016    Procedure: ENDOSCOPIC RETROGRADE CHOLANGIOPANCREATOGRAPHY (ERCP);  Surgeon: HDoran Stabler MD;  [facility];  Service: Endoscopy;  Laterality: N/A;    Prior to Admission medications   Medication Sig Start Date End Date Taking? Authorizing Provider  acetaminophen (TYLENOL) 500 MG tablet Take 500 mg by mouth every 6 (six) hours as needed for moderate pain, fever or headache. Pain    Yes Historical Provider, MD  ALPRAZolam (XANAX) 0.5 MG tablet Take 1 tablet (0.5 mg total) by mouth 2 (two) times daily. Patient taking differently: Take 0.5 mg by mouth 2 (two) times daily as needed.  02/04/16  Yes CHali Marry MD  aspirin 81 MG tablet Take 81 mg by mouth daily as needed for pain.    Yes Historical Provider, MD  dicyclomine (BENTYL) 20 MG tablet Take 0.5-1 tablets (10-20 mg total) by mouth 2 (two) times daily as needed for spasms. 02/13/16  Yes HNelida MeuseIII, MD  lisinopril (PRINIVIL,ZESTRIL) 40 MG tablet TAKE 1 TABLET (40 MG TOTAL) BY MOUTH DAILY. 02/04/16  Yes BLelon Perla MD  metoprolol (LOPRESSOR) 50 MG tablet Take 1.5 tablets (75 mg total) by mouth 2 (two) times daily. 02/04/16  Yes BLelon Perla MD  psyllium (METAMUCIL) 58.6 % packet Take 1 packet by mouth daily.   Yes Historical Provider, MD  traMADol (ULTRAM) 50 MG tablet TAKE 1 TABLET BY MOUTH EVERY 8 HOURS AS NEEDED FOR PAIN Patient taking differently: TAKE 50 MG BY MOUTH EVERY 8 HOURS AS NEEDED FOR PAIN 02/10/16  Yes CHali Marry MD  ursodiol (ACTIGALL) 500 MG tablet Take 1 tablet (500 mg total) by mouth daily. 02/13/16  Yes HNelida MeuseIII, MD  polyethylene glycol powder (GLYCOLAX/MIRALAX) powder Take 1-2 capfuls daily in water Patient not taking: Reported on 03/08/2016 02/04/16   CHali Marry MD    Current Facility-Administered Medications  Medication Dose Route Frequency Provider Last Rate Last Dose  . 0.9 %  sodium chloride infusion   Intravenous Continuous ALacretia Leigh MD 500 mL/hr at 03/09/16 0325 500 mL/hr at 03/09/16 0325  . albuterol (PROVENTIL) (2.5 MG/3ML) 0.083% nebulizer solution  2.5 mg  2.5 mg Nebulization Q4H PRN Ivor Costa, MD      . ALPRAZolam Duanne Moron) tablet 0.5 mg  0.5 mg Oral BID PRN Ivor Costa, MD      . aspirin chewable tablet 81 mg  81 mg Oral Daily PRN Ivor Costa, MD      . dicyclomine (BENTYL) tablet 10-20 mg  10-20 mg Oral BID PRN Ivor Costa, MD      . hydrALAZINE (APRESOLINE) injection 5 mg  5 mg Intravenous Q2H PRN Ivor Costa, MD      . iohexol (OMNIPAQUE) 300 MG/ML solution 25 mL  25 mL Intravenous Once PRN Lacretia Leigh, MD   25 mL at 03/08/16 2317  . metoprolol tartrate (LOPRESSOR) tablet 25 mg  25 mg Oral BID Reyne Dumas, MD      . morphine 2 MG/ML injection 2 mg  2 mg Intravenous Q4H PRN Ivor Costa, MD      . ondansetron [facility]) injection 4 mg  4 mg Intravenous Q8H PRN Ivor Costa, MD      . piperacillin-tazobactam (ZOSYN) IVPB 3.375 g  3.375 g Intravenous Q8H Minda Ditto, RPH      . psyllium (HYDROCIL/METAMUCIL) packet 1 packet  1 packet Oral  Daily Ivor Costa, MD      . sodium chloride flush (NS) 0.9 % injection 3 mL  3 mL Intravenous Q12H Ivor Costa, MD   3 mL at 03/09/16 0155  . traMADol (ULTRAM) tablet 50 mg  50 mg Oral Q6H PRN Ivor Costa, MD      . ursodiol (ACTIGALL) tablet 500 mg  500 mg Oral Daily Ivor Costa, MD        Allergies as of 03/08/2016 - Review Complete 03/08/2016  Allergen Reaction Noted  . Hyoscyamine Other (See Comments) 11/19/2015  . Statins  07/13/2011  . Rofecoxib Rash     Family History  Problem Relation Age of Onset  . Stroke Father   . Diabetes Son     brittle diabetes  . Colon polyps Neg Hx   . Colon cancer Neg Hx   . Esophageal cancer Neg Hx   . Stomach cancer Neg Hx   . Pancreatic cancer Neg Hx   . Kidney disease Neg Hx   . Liver disease Neg Hx     Social History   Social History  . Marital Status: Single    Spouse Name: N/A  . Number of Children: N/A  . Years of Education: N/A   Occupational History  . Not on file.   Social History Main Topics  . Smoking status: Former Smoker -- 40 years  . Smokeless tobacco: Never Used  . Alcohol Use: No  . Drug Use: No  . Sexual Activity: Not on file   Other Topics Concern  . Not on file   Social History Narrative    Review of Systems: Ten point ROS is O/W negative except as mentioned in HPI.  Physical Exam: Vital signs in last 24 hours: Temp:  [97.6 F (36.4 C)-98.5 F (36.9 C)] 98.1 F (36.7 C) (04/10 4967) Pulse Rate:  [61-85] 72 (04/10 0633) Resp:  [14-20] 20 (04/10 0633) BP: (97-163)/(46-70) 119/50 mmHg (04/10 0633) SpO2:  [92 %-100 %] 94 % (04/10 5916) Weight:  [158 lb 4.6 oz (71.8 kg)] 158 lb 4.6 oz (71.8 kg) (04/10 0050) Last BM Date: 03/08/16 General:  Alert, Well-developed, well-nourished, pleasant and cooperative in NAD Head:  Normocephalic and atraumatic. Eyes:  Sclera clear, no icterus.  Conjunctiva pink.  Ears:  Normal auditory acuity. Mouth:  No deformity or lesions.   Lungs:  Clear throughout to auscultation.    No wheezes, crackles, or rhonchi.  Heart:  Regular rate and rhythm; SEM noted. Abdomen:  Soft, non-distended.  BS present.  Some right sided TTP.  Rectal:  Deferred  Msk:  Symmetrical without gross deformities. Pulses:  Normal pulses noted. Extremities:  Without clubbing or edema. Neurologic:  Alert and oriented x 4;  grossly normal neurologically. Skin:  Intact without significant lesions or rashes. Psych:  Alert and cooperative. Normal mood and affect.  Intake/Output from previous day: 04/09 0701 - 04/10 0700 In: -  Out: 300 [Urine:300]  Lab Results:  Recent Labs  03/08/16 2003 03/09/16 0510  WBC 15.8* 27.1*  HGB 14.6 12.1  HCT 42.9 36.6  PLT 249 207   BMET  Recent Labs  03/08/16 2003 03/09/16 0213 03/09/16 0510  NA 132* 130* 132*  K 3.8 4.0 4.1  CL 95* 98* 102  CO2 '26 25 23  '$ GLUCOSE 140* 117* 126*  BUN '11 11 11  '$ CREATININE 0.71 0.81 0.62  CALCIUM 9.2 8.4* 8.1*   LFT  Recent Labs  03/09/16 0510  PROT 5.4*  ALBUMIN 2.9*  AST 176*  ALT 209*  ALKPHOS 106  BILITOT 3.0*   PT/INR  Recent Labs  03/09/16 0213  LABPROT 14.7  INR 1.18   Studies/Results: Ct Chest W Contrast  03/08/2016  CLINICAL DATA:  Mild dyspnea with upper abdominal pain. EXAM: CT CHEST WITH CONTRAST TECHNIQUE: Multidetector CT imaging of the chest was performed during intravenous contrast administration. CONTRAST:  57m ISOVUE-300 IOPAMIDOL (ISOVUE-300) INJECTION 61% COMPARISON:  08/15/2015 FINDINGS: There is stable postsurgical irregularity on the right. There is a noncalcified 5 x 6 mm nodule in the left lower lobe posterior base. This has been present over the past several examinations dating back to at least 02/17/2010, when it measured 5.0 mm. No suspicious nodules are evident. There is prominent interlobular septal thickening throughout both lungs. This is new from 08/15/2015 and hit likely represents interstitial fluid. The vasculature also appears moderately prominent. This may  represent congestive heart failure. No frank alveolar edema. No effusions. There is mild unchanged pleural thickening of the right posterior hemi thorax. There is no pathologic adenopathy.  Airways are patent. Upper abdomen is notable for pneumobilia, unchanged. There is unchanged dilatation of the thoracic aorta, measuring 4.4 cm in the ascending aorta and 3.8 cm in the descending. No dissection or other acute vascular abnormality. IMPRESSION: 1. Interlobular septal thickening throughout both lungs which is new, likely due to interstitial fluid. This could represent CHF as the vasculature also appears moderately prominent. 2. Stable left lower lobe posterior base nodule. No suspicious nodules. 3. Unchanged dilatation of the thoracic aorta. Electronically Signed   By: DAndreas NewportM.D.   On: 03/08/2016 21:44   Ct Abdomen Pelvis W Contrast  03/08/2016  CLINICAL DATA:  Acute onset of mid abdominal pain. Initial encounter. EXAM: CT ABDOMEN AND PELVIS WITH CONTRAST TECHNIQUE: Multidetector CT imaging of the abdomen and pelvis was performed using the standard protocol following bolus administration of intravenous contrast. CONTRAST:  880mISOVUE-300 IOPAMIDOL (ISOVUE-300) INJECTION 61% COMPARISON:  CTA of the chest, abdomen and pelvis performed 08/15/2015 FINDINGS: Bibasilar scarring is noted, with trace right-sided pleural fluid. A 7 mm nodule is noted at the left lung base. Periportal edema is noted. Diffuse pneumobilia is seen. There is dilatation of the common bile duct to 1.9 cm in diameter. Would correlate  for any evidence of postcholecystectomy syndrome. The liver is otherwise grossly unremarkable. The spleen is unremarkable in appearance. The patient is status post cholecystectomy. The pancreas and adrenal glands are grossly unremarkable. The kidneys are unremarkable in appearance. There is no evidence of hydronephrosis. No renal or ureteral stones are seen. No perinephric stranding is appreciated. No free  fluid is identified. The small bowel is unremarkable in appearance. The stomach is within normal limits. No acute vascular abnormalities are seen. There is mild aneurysmal dilatation of the infrarenal abdominal aorta to 3.4 cm in transverse dimension. Scattered calcification is noted along the abdominal aorta and its branches. The patient is status post appendectomy. Scattered diverticulosis is noted along the transverse, descending and sigmoid colon, without evidence of diverticulitis. The bladder is moderately distended and partially filled with contrast. The uterus is grossly unremarkable. Scattered prominent periuterine vessels could reflect pelvic congestion syndrome in the appropriate clinical situation. The ovaries are relatively symmetric. No inguinal lymphadenopathy is seen. No acute osseous abnormalities are identified. IMPRESSION: 1. Periportal edema noted. Diffuse pneumobilia seen. Dilatation of the common bile duct to 1.9 cm in diameter. Would correlate for any evidence of postcholecystectomy syndrome. 2. Mild aneurysmal dilatation of the infrarenal abdominal aorta to 3.4 cm in transverse dimension. Scattered calcification along the abdominal aorta and its branches. Recommend followup by ultrasound in 3 years. This recommendation follows ACR consensus guidelines: White Paper of the ACR Incidental Findings Committee II on Vascular Findings. J Am Coll Radiol 2013; 70:488-891 3. Scattered prominent periuterine vessels could reflect pelvic congestion syndrome in the appropriate clinical situation. 4. Bibasilar scarring, with trace right-sided pleural fluid. 7 mm nodule at the left lung base. It is difficult to determine whether this has changed in size since the prior study. Non-contrast chest CT at 6-12 months is recommended. If the nodule is stable at time of repeat CT, then future CT at 18-24 months (from today's scan) is considered optional for low-risk patients, but is recommended for high-risk  patients. This recommendation follows the consensus statement: Guidelines for Management of Incidental Pulmonary Nodules Detected on CT Images:From the Fleischner Society 2017; published online before print (10.1148/radiol.6945038882). 5. Scattered diverticulosis along the transverse, descending and sigmoid colon, without evidence of diverticulitis. Electronically Signed   By: Garald Balding M.D.   On: 03/08/2016 23:46   IMPRESSION:  -81 year old female with recurrent CBD stones s/p ERCP in 01/2016 who presented with recurrent RUQ abdominal pain, elevated LFT's, leukocytosis, and CT scan showing CBD dilated to 1.9 cm.  Suspect recurrent CBD stones, ? Cholangitis.  Already on antibiotics.  PLAN: -For ERCP this afternoon with Dr. Fuller Plan.  Post-procedural indomethacin ordered.  ZEHR, JESSICA D.  03/09/2016, 8:52 AM  Pager number [phone]      Attending physician's note   I have taken a history, examined the patient and reviewed the chart. I agree with the Advanced Practitioner's note, impression and recommendations. Recurrent CBD stones with ERCP and extension of sphincterotomy and stone extraction in February now with RUQ pain, elevated LFTs, elevated WBC and CT showing dilated CBD to 1.9 cm, periportal edema and pneumobilia. Strongly suspect recurrent/retained CBD stones and possible cholangitis. ERCP today. The risks (including pancreatitis, bleeding, perforation, infection, missed lesions, medication reactions and possible prolonged hospitalization or surgery if complications occur), benefits, and alternatives to ERCP with possible sphincterotomy, possible stone extraction, possible stent placement were discussed with the patient and they consent to proceed. IV antibiotics, post procedure indomethacin. Consider increasing ursodiol dosing.  Lucio Edward, MD [facility] [phone] Mon-Fri 8a-5p [phone] after 5p, weekends, holidays

## 2016-03-09 NOTE — Progress Notes (Signed)
Pharmacy Antibiotic Note  Laurie [name] is a 81 y.o. female admitted on 03/08/2016 with intra-abdominal infection.   Pharmacy has been consulted for Cipro dosing. Abx then changed to Zosyn, pharmacy to assist dosing  Plan: Zosyn 3.375gm q8h- 4 hr infusion F/u cultures  Weight: 158 lb 4.6 oz (71.8 kg)  Temp (24hrs), Avg:98.2 F (36.8 C), Min:97.6 F (36.4 C), Max:98.5 F (36.9 C)   Recent Labs Lab 03/08/16 2003 03/09/16 0213 03/09/16 0510  WBC 15.8*  --  27.1*  CREATININE 0.71 0.81 0.62  LATICACIDVEN  --  2.5* 1.2    Estimated Creatinine Clearance: 56.4 mL/min (by C-G formula based on Cr of 0.62).    Allergies  Allergen Reactions  . Hyoscyamine Other (See Comments)    Mental status changes.  (as of 02/13/16 patient denies any problems with this medication)  . Statins     Muscle aches  . Rofecoxib Rash   Antimicrobials this admission: 4/10 Cipro >>  4/10 4/10 Flagyl >>  4/10 4/10 Zosyn >>  Microbiology results: 4/10 BCx: sent  Thank you for allowing pharmacy to be a part of this patient's care.  Minda Ditto PharmD Pager [phone] 03/09/2016, 7:59 AM

## 2016-03-09 NOTE — Interval H&P Note (Signed)
History and Physical Interval Note:  03/09/2016 12:25 PM  Laurie [name]  has presented today for surgery, with the diagnosis of CBD stones  The various methods of treatment have been discussed with the patient and family. After consideration of risks, benefits and other options for treatment, the patient has consented to  Procedure(s): ENDOSCOPIC RETROGRADE CHOLANGIOPANCREATOGRAPHY (ERCP) (N/A) as a surgical intervention .  The patient's history has been reviewed, patient examined, no change in status, stable for surgery.  I have reviewed the patient's chart and labs.  Questions were answered to the patient's satisfaction.     Pricilla Riffle. Fuller Plan

## 2016-03-09 NOTE — H&P (View-Only) (Signed)
Referring Provider: No ref. provider found Primary Care Physician:  Beatrice Lecher, MD Primary Gastroenterologist:  Dr. Loletha Carrow  Reason for Consultation:  ? Cholangitis/CBD stones  HPI: Laurie [name] is a 81 y.o. female who has a history of aortic stenosis status post aortic valve replacement in September 2009. She has a pericardial tissue valve. She also has had a septal myectomy and a right middle lobectomy due to lung cancer. She has a history of COPD, postoperative atrial fibrillation, hypertension, CVA, abdominal aortic aneurysm, iliac aneurysm, and coronary artery disease. In 2005 she was evaluated by Dr. Liane Comber in [place] due to anemia. She had a colonoscopy, upper endoscopy, and pill endoscopy and was found to have AVMs. It was at that time she was diagnosed with aortic stenosis and subsequently had a aortic valve replacement.  In January 2015 she had a laparoscopic cholecystectomy by Dr. Rosendo Gros. She was evaluated in the GI office in May 2016 with ongoing epigastric pain. She had been evaluated by Dr. Stanford Breed for follow-up of her aneurysm and had had a CTA of the abdomen and pelvis that revealed intra-and extrahepatic biliary ductal dilatation with slight increased diameter of the CBD to 1.4 cm with abrupt distal tapering felt to possibly represent a stricture. She underwent an ERCP by Dr. Deatra Ina on 05/08/2015 and was noted to have a large periampullary diverticulum. No stones were noted.  Was feeling well until February of this year when she presented with CBD stones; underwent ERCP on 2/13 by Dr. Loletha Carrow where she had large CBD stones removed and possible cholangitis, sphincterotomy was extended.    Presented back to [facility] ED on 4/9 with abdominal pain that began the same day as presentation.  In ED, patient was found to have WBC 15.8, negative troponin, BNP 115, lipase 21, lactate 2.5, negative urinalysis, tachypnea, no tachycardia, sodium 132, creatinine 0.71. Abnormal liver  function with AST 323, ALT 234, total bilirubin 1.8 (previously had normalized after last ERCP). Oxygen desaturation to 83% which responded to non-breather and nasal cannula and improved to oxygen 100%.   # CT-abd/pelvis: Periportal edema noted. Diffuse pneumobilia seen. Dilatation of the common bile duct to 1.9 cm in diameter; mild aneurysmal dilatation of the infrarenal abdominal aorta to 3.4 cm in transverse dimension. Scattered calcification along the abdominal aorta and its branches; scattered prominent periuterine vessels could reflect pelvic congestion syndrome in the appropriate clinical situation; bibasilar scarring, with trace right-sided pleural fluid, 7 mm nodule at the left lung base; scattered diverticulosis along the transverse, descending and sigmoid colon, without evidence of diverticulitis.  # CT-chest showed interlobular septal thickening throughout both lungs which is new, likely due to interstitial fluid. This could represent CHF as the vasculature also appears moderately prominent; Stable left lower lobe posterior base nodule. Unchanged dilatation of the thoracic aorta.  Total bili continues to increase to 3.0 today, although other LFT's are trending down slightly.  WBC count increased to 27.2 today.  Is on Zosyn.   Past Medical History  Diagnosis Date  . Anemia     iron deficiency  . Shingles 11-09  . COPD (chronic obstructive pulmonary disease) ([place]) 2005    on CXR with R apical scaring  . Atrial fibrillation ([place])     post op  . Hypertension   . Cerebrovascular disease   . S/P aortic valve replacement 08/28/2008    [facility]  . S/P lobectomy of lung 08/28/2008    rt middle lobe  dr. Roxy Manns  . AAA (abdominal aortic aneurysm) ([place])   .  CAD (coronary artery disease)   . Hypercholesterolemia     Hx: of  . Headache(784.0)     Hx: of migraines in the past  . Carcinoma of lung ([place]) 08/28/2008    T2N0 moderately diff. squamous cell CA  . Ascending aorta dilatation [facility])       Past Surgical History  Procedure Laterality Date  . Tonsillectomy and adenoidectomy    . U/s guided aspiration of r breast cyst   2009    at the breast clinic  . Rml removed dr Ricard Dillon for lung cancer  08/28/2008    T2N0 squamous cell CA  . Abdominal surgery    . Aortic valve replacement  08/28/2008    [facility]  . Colonoscopy      \  . Cataract extraction w/ intraocular lens  implant, bilateral Bilateral   . Dilation and curettage of uterus    . Cardiac catheterization    . Appendectomy    . Cholecystectomy N/A 12/13/2013    Procedure: LAPAROSCOPIC CHOLECYSTECTOMY;  Surgeon: ARalene Ok MD;  [place]  Service: General;  Laterality: N/A;  . Ercp N/A 05/08/2015    Procedure: ENDOSCOPIC RETROGRADE CHOLANGIOPANCREATOGRAPHY (ERCP);  Surgeon: RInda Castle MD;  [place]  Service: Endoscopy;  Laterality: N/A;  . Ercp N/A 01/13/2016    Procedure: ENDOSCOPIC RETROGRADE CHOLANGIOPANCREATOGRAPHY (ERCP);  Surgeon: HDoran Stabler MD;  [facility];  Service: Endoscopy;  Laterality: N/A;    Prior to Admission medications   Medication Sig Start Date End Date Taking? Authorizing Provider  acetaminophen (TYLENOL) 500 MG tablet Take 500 mg by mouth every 6 (six) hours as needed for moderate pain, fever or headache. Pain    Yes Historical Provider, MD  ALPRAZolam (XANAX) 0.5 MG tablet Take 1 tablet (0.5 mg total) by mouth 2 (two) times daily. Patient taking differently: Take 0.5 mg by mouth 2 (two) times daily as needed.  02/04/16  Yes CHali Marry MD  aspirin 81 MG tablet Take 81 mg by mouth daily as needed for pain.    Yes Historical Provider, MD  dicyclomine (BENTYL) 20 MG tablet Take 0.5-1 tablets (10-20 mg total) by mouth 2 (two) times daily as needed for spasms. 02/13/16  Yes HNelida MeuseIII, MD  lisinopril (PRINIVIL,ZESTRIL) 40 MG tablet TAKE 1 TABLET (40 MG TOTAL) BY MOUTH DAILY. 02/04/16  Yes BLelon Perla MD  metoprolol (LOPRESSOR) 50 MG tablet Take 1.5 tablets (75 mg total) by mouth 2 (two) times daily. 02/04/16  Yes BLelon Perla MD  psyllium (METAMUCIL) 58.6 % packet Take 1 packet by mouth daily.   Yes Historical Provider, MD  traMADol (ULTRAM) 50 MG tablet TAKE 1 TABLET BY MOUTH EVERY 8 HOURS AS NEEDED FOR PAIN Patient taking differently: TAKE 50 MG BY MOUTH EVERY 8 HOURS AS NEEDED FOR PAIN 02/10/16  Yes CHali Marry MD  ursodiol (ACTIGALL) 500 MG tablet Take 1 tablet (500 mg total) by mouth daily. 02/13/16  Yes HNelida MeuseIII, MD  polyethylene glycol powder (GLYCOLAX/MIRALAX) powder Take 1-2 capfuls daily in water Patient not taking: Reported on 03/08/2016 02/04/16   CHali Marry MD    Current Facility-Administered Medications  Medication Dose Route Frequency Provider Last Rate Last Dose  . 0.9 %  sodium chloride infusion   Intravenous Continuous ALacretia Leigh MD 500 mL/hr at 03/09/16 0325 500 mL/hr at 03/09/16 0325  . albuterol (PROVENTIL) (2.5 MG/3ML) 0.083% nebulizer solution  2.5 mg  2.5 mg Nebulization Q4H PRN Ivor Costa, MD      . ALPRAZolam Duanne Moron) tablet 0.5 mg  0.5 mg Oral BID PRN Ivor Costa, MD      . aspirin chewable tablet 81 mg  81 mg Oral Daily PRN Ivor Costa, MD      . dicyclomine (BENTYL) tablet 10-20 mg  10-20 mg Oral BID PRN Ivor Costa, MD      . hydrALAZINE (APRESOLINE) injection 5 mg  5 mg Intravenous Q2H PRN Ivor Costa, MD      . iohexol (OMNIPAQUE) 300 MG/ML solution 25 mL  25 mL Intravenous Once PRN Lacretia Leigh, MD   25 mL at 03/08/16 2317  . metoprolol tartrate (LOPRESSOR) tablet 25 mg  25 mg Oral BID Reyne Dumas, MD      . morphine 2 MG/ML injection 2 mg  2 mg Intravenous Q4H PRN Ivor Costa, MD      . ondansetron [facility]) injection 4 mg  4 mg Intravenous Q8H PRN Ivor Costa, MD      . piperacillin-tazobactam (ZOSYN) IVPB 3.375 g  3.375 g Intravenous Q8H Minda Ditto, RPH      . psyllium (HYDROCIL/METAMUCIL) packet 1 packet  1 packet Oral  Daily Ivor Costa, MD      . sodium chloride flush (NS) 0.9 % injection 3 mL  3 mL Intravenous Q12H Ivor Costa, MD   3 mL at 03/09/16 0155  . traMADol (ULTRAM) tablet 50 mg  50 mg Oral Q6H PRN Ivor Costa, MD      . ursodiol (ACTIGALL) tablet 500 mg  500 mg Oral Daily Ivor Costa, MD        Allergies as of 03/08/2016 - Review Complete 03/08/2016  Allergen Reaction Noted  . Hyoscyamine Other (See Comments) 11/19/2015  . Statins  07/13/2011  . Rofecoxib Rash     Family History  Problem Relation Age of Onset  . Stroke Father   . Diabetes Son     brittle diabetes  . Colon polyps Neg Hx   . Colon cancer Neg Hx   . Esophageal cancer Neg Hx   . Stomach cancer Neg Hx   . Pancreatic cancer Neg Hx   . Kidney disease Neg Hx   . Liver disease Neg Hx     Social History   Social History  . Marital Status: Single    Spouse Name: N/A  . Number of Children: N/A  . Years of Education: N/A   Occupational History  . Not on file.   Social History Main Topics  . Smoking status: Former Smoker -- 40 years  . Smokeless tobacco: Never Used  . Alcohol Use: No  . Drug Use: No  . Sexual Activity: Not on file   Other Topics Concern  . Not on file   Social History Narrative    Review of Systems: Ten point ROS is O/W negative except as mentioned in HPI.  Physical Exam: Vital signs in last 24 hours: Temp:  [97.6 F (36.4 C)-98.5 F (36.9 C)] 98.1 F (36.7 C) (04/10 7782) Pulse Rate:  [61-85] 72 (04/10 0633) Resp:  [14-20] 20 (04/10 0633) BP: (97-163)/(46-70) 119/50 mmHg (04/10 0633) SpO2:  [92 %-100 %] 94 % (04/10 4235) Weight:  [158 lb 4.6 oz (71.8 kg)] 158 lb 4.6 oz (71.8 kg) (04/10 0050) Last BM Date: 03/08/16 General:  Alert, Well-developed, well-nourished, pleasant and cooperative in NAD Head:  Normocephalic and atraumatic. Eyes:  Sclera clear, no icterus.  Conjunctiva pink.  Ears:  Normal auditory acuity. Mouth:  No deformity or lesions.   Lungs:  Clear throughout to auscultation.    No wheezes, crackles, or rhonchi.  Heart:  Regular rate and rhythm; SEM noted. Abdomen:  Soft, non-distended.  BS present.  Some right sided TTP.  Rectal:  Deferred  Msk:  Symmetrical without gross deformities. Pulses:  Normal pulses noted. Extremities:  Without clubbing or edema. Neurologic:  Alert and oriented x 4;  grossly normal neurologically. Skin:  Intact without significant lesions or rashes. Psych:  Alert and cooperative. Normal mood and affect.  Intake/Output from previous day: 04/09 0701 - 04/10 0700 In: -  Out: 300 [Urine:300]  Lab Results:  Recent Labs  03/08/16 2003 03/09/16 0510  WBC 15.8* 27.1*  HGB 14.6 12.1  HCT 42.9 36.6  PLT 249 207   BMET  Recent Labs  03/08/16 2003 03/09/16 0213 03/09/16 0510  NA 132* 130* 132*  K 3.8 4.0 4.1  CL 95* 98* 102  CO2 '26 25 23  '$ GLUCOSE 140* 117* 126*  BUN '11 11 11  '$ CREATININE 0.71 0.81 0.62  CALCIUM 9.2 8.4* 8.1*   LFT  Recent Labs  03/09/16 0510  PROT 5.4*  ALBUMIN 2.9*  AST 176*  ALT 209*  ALKPHOS 106  BILITOT 3.0*   PT/INR  Recent Labs  03/09/16 0213  LABPROT 14.7  INR 1.18   Studies/Results: Ct Chest W Contrast  03/08/2016  CLINICAL DATA:  Mild dyspnea with upper abdominal pain. EXAM: CT CHEST WITH CONTRAST TECHNIQUE: Multidetector CT imaging of the chest was performed during intravenous contrast administration. CONTRAST:  62m ISOVUE-300 IOPAMIDOL (ISOVUE-300) INJECTION 61% COMPARISON:  08/15/2015 FINDINGS: There is stable postsurgical irregularity on the right. There is a noncalcified 5 x 6 mm nodule in the left lower lobe posterior base. This has been present over the past several examinations dating back to at least 02/17/2010, when it measured 5.0 mm. No suspicious nodules are evident. There is prominent interlobular septal thickening throughout both lungs. This is new from 08/15/2015 and hit likely represents interstitial fluid. The vasculature also appears moderately prominent. This may  represent congestive heart failure. No frank alveolar edema. No effusions. There is mild unchanged pleural thickening of the right posterior hemi thorax. There is no pathologic adenopathy.  Airways are patent. Upper abdomen is notable for pneumobilia, unchanged. There is unchanged dilatation of the thoracic aorta, measuring 4.4 cm in the ascending aorta and 3.8 cm in the descending. No dissection or other acute vascular abnormality. IMPRESSION: 1. Interlobular septal thickening throughout both lungs which is new, likely due to interstitial fluid. This could represent CHF as the vasculature also appears moderately prominent. 2. Stable left lower lobe posterior base nodule. No suspicious nodules. 3. Unchanged dilatation of the thoracic aorta. Electronically Signed   By: DAndreas NewportM.D.   On: 03/08/2016 21:44   Ct Abdomen Pelvis W Contrast  03/08/2016  CLINICAL DATA:  Acute onset of mid abdominal pain. Initial encounter. EXAM: CT ABDOMEN AND PELVIS WITH CONTRAST TECHNIQUE: Multidetector CT imaging of the abdomen and pelvis was performed using the standard protocol following bolus administration of intravenous contrast. CONTRAST:  838mISOVUE-300 IOPAMIDOL (ISOVUE-300) INJECTION 61% COMPARISON:  CTA of the chest, abdomen and pelvis performed 08/15/2015 FINDINGS: Bibasilar scarring is noted, with trace right-sided pleural fluid. A 7 mm nodule is noted at the left lung base. Periportal edema is noted. Diffuse pneumobilia is seen. There is dilatation of the common bile duct to 1.9 cm in diameter. Would correlate  for any evidence of postcholecystectomy syndrome. The liver is otherwise grossly unremarkable. The spleen is unremarkable in appearance. The patient is status post cholecystectomy. The pancreas and adrenal glands are grossly unremarkable. The kidneys are unremarkable in appearance. There is no evidence of hydronephrosis. No renal or ureteral stones are seen. No perinephric stranding is appreciated. No free  fluid is identified. The small bowel is unremarkable in appearance. The stomach is within normal limits. No acute vascular abnormalities are seen. There is mild aneurysmal dilatation of the infrarenal abdominal aorta to 3.4 cm in transverse dimension. Scattered calcification is noted along the abdominal aorta and its branches. The patient is status post appendectomy. Scattered diverticulosis is noted along the transverse, descending and sigmoid colon, without evidence of diverticulitis. The bladder is moderately distended and partially filled with contrast. The uterus is grossly unremarkable. Scattered prominent periuterine vessels could reflect pelvic congestion syndrome in the appropriate clinical situation. The ovaries are relatively symmetric. No inguinal lymphadenopathy is seen. No acute osseous abnormalities are identified. IMPRESSION: 1. Periportal edema noted. Diffuse pneumobilia seen. Dilatation of the common bile duct to 1.9 cm in diameter. Would correlate for any evidence of postcholecystectomy syndrome. 2. Mild aneurysmal dilatation of the infrarenal abdominal aorta to 3.4 cm in transverse dimension. Scattered calcification along the abdominal aorta and its branches. Recommend followup by ultrasound in 3 years. This recommendation follows ACR consensus guidelines: White Paper of the ACR Incidental Findings Committee II on Vascular Findings. J Am Coll Radiol 2013; 17:001-749 3. Scattered prominent periuterine vessels could reflect pelvic congestion syndrome in the appropriate clinical situation. 4. Bibasilar scarring, with trace right-sided pleural fluid. 7 mm nodule at the left lung base. It is difficult to determine whether this has changed in size since the prior study. Non-contrast chest CT at 6-12 months is recommended. If the nodule is stable at time of repeat CT, then future CT at 18-24 months (from today's scan) is considered optional for low-risk patients, but is recommended for high-risk  patients. This recommendation follows the consensus statement: Guidelines for Management of Incidental Pulmonary Nodules Detected on CT Images:From the Fleischner Society 2017; published online before print (10.1148/radiol.4496759163). 5. Scattered diverticulosis along the transverse, descending and sigmoid colon, without evidence of diverticulitis. Electronically Signed   By: Garald Balding M.D.   On: 03/08/2016 23:46   IMPRESSION:  -80 year old female with recurrent CBD stones s/p ERCP in 01/2016 who presented with recurrent RUQ abdominal pain, elevated LFT's, leukocytosis, and CT scan showing CBD dilated to 1.9 cm.  Suspect recurrent CBD stones, ? Cholangitis.  Already on antibiotics.  PLAN: -For ERCP this afternoon with Dr. Fuller Plan.  Post-procedural indomethacin ordered.  ZEHR, JESSICA D.  03/09/2016, 8:52 AM  Pager number [phone]      Attending physician's note   I have taken a history, examined the patient and reviewed the chart. I agree with the Advanced Practitioner's note, impression and recommendations. Recurrent CBD stones with ERCP and extension of sphincterotomy and stone extraction in February now with RUQ pain, elevated LFTs, elevated WBC and CT showing dilated CBD to 1.9 cm, periportal edema and pneumobilia. Strongly suspect recurrent/retained CBD stones and possible cholangitis. ERCP today. The risks (including pancreatitis, bleeding, perforation, infection, missed lesions, medication reactions and possible prolonged hospitalization or surgery if complications occur), benefits, and alternatives to ERCP with possible sphincterotomy, possible stone extraction, possible stent placement were discussed with the patient and they consent to proceed. IV antibiotics, post procedure indomethacin. Consider increasing ursodiol dosing.  Lucio Edward, MD [facility] [phone] Mon-Fri 8a-5p [phone] after 5p, weekends, holidays

## 2016-03-10 ENCOUNTER — Inpatient Hospital Stay (HOSPITAL_COMMUNITY): Payer: Commercial Managed Care - HMO

## 2016-03-10 ENCOUNTER — Telehealth: Payer: Self-pay | Admitting: Cardiology

## 2016-03-10 DIAGNOSIS — K830 Cholangitis: Secondary | ICD-10-CM

## 2016-03-10 DIAGNOSIS — R1011 Right upper quadrant pain: Secondary | ICD-10-CM

## 2016-03-10 DIAGNOSIS — I4891 Unspecified atrial fibrillation: Secondary | ICD-10-CM | POA: Diagnosis present

## 2016-03-10 DIAGNOSIS — J9601 Acute respiratory failure with hypoxia: Secondary | ICD-10-CM

## 2016-03-10 LAB — CBC
HCT: 33.5 % — ABNORMAL LOW (ref 36.0–46.0)
Hemoglobin: 11.1 g/dL — ABNORMAL LOW (ref 12.0–15.0)
MCH: 28.8 pg (ref 26.0–34.0)
MCHC: 33.1 g/dL (ref 30.0–36.0)
MCV: 87 fL (ref 78.0–100.0)
PLATELETS: 168 10*3/uL (ref 150–400)
RBC: 3.85 MIL/uL — AB (ref 3.87–5.11)
RDW: 13.9 % (ref 11.5–15.5)
WBC: 16.3 10*3/uL — AB (ref 4.0–10.5)

## 2016-03-10 LAB — COMPREHENSIVE METABOLIC PANEL
ALBUMIN: 2.6 g/dL — AB (ref 3.5–5.0)
ALT: 119 U/L — AB (ref 14–54)
AST: 63 U/L — AB (ref 15–41)
Alkaline Phosphatase: 94 U/L (ref 38–126)
Anion gap: 5 (ref 5–15)
BUN: 11 mg/dL (ref 6–20)
CHLORIDE: 105 mmol/L (ref 101–111)
CO2: 25 mmol/L (ref 22–32)
CREATININE: 0.64 mg/dL (ref 0.44–1.00)
Calcium: 8.2 mg/dL — ABNORMAL LOW (ref 8.9–10.3)
GFR calc Af Amer: >60 mL/min (ref >60)
GLUCOSE: 122 mg/dL — AB (ref 65–99)
Potassium: 4.2 mmol/L (ref 3.5–5.1)
Sodium: 135 mmol/L (ref 135–145)
Total Bilirubin: 2.6 mg/dL — ABNORMAL HIGH (ref 0.3–1.2)
Total Protein: 5.1 g/dL — ABNORMAL LOW (ref 6.5–8.1)

## 2016-03-10 LAB — GLUCOSE, CAPILLARY: Glucose-Capillary: 123 mg/dL — ABNORMAL HIGH (ref 65–99)

## 2016-03-10 LAB — TROPONIN I
TROPONIN I: 0.04 ng/mL — AB (ref <0.031)
Troponin I: 0.04 ng/mL — ABNORMAL HIGH (ref <0.031)
Troponin I: 0.04 ng/mL — ABNORMAL HIGH (ref <0.031)

## 2016-03-10 LAB — MRSA PCR SCREENING: MRSA BY PCR: NEGATIVE

## 2016-03-10 MED ORDER — DEXTROSE 5 % IV SOLN
5.0000 mg/h | INTRAVENOUS | Status: DC
  Administered 2016-03-10: 5 mg/h via INTRAVENOUS
  Administered 2016-03-11 (×3): 10 mg/h via INTRAVENOUS
  Filled 2016-03-10 (×4): qty 100

## 2016-03-10 MED ORDER — DILTIAZEM HCL 25 MG/5ML IV SOLN
5.0000 mg | INTRAVENOUS | Status: DC | PRN
  Administered 2016-03-10 (×2): 5 mg via INTRAVENOUS
  Administered 2016-03-10: 10 mg via INTRAVENOUS
  Filled 2016-03-10 (×6): qty 5

## 2016-03-10 MED ORDER — CETYLPYRIDINIUM CHLORIDE 0.05 % MT LIQD: 7.0000 mL | Freq: Two times a day (BID) | OROMUCOSAL | Status: DC

## 2016-03-10 MED ORDER — FUROSEMIDE 10 MG/ML IJ SOLN
20.0000 mg | Freq: Two times a day (BID) | INTRAMUSCULAR | Status: DC
  Administered 2016-03-10 – 2016-03-11 (×3): 20 mg via INTRAVENOUS
  Filled 2016-03-10 (×4): qty 2

## 2016-03-10 MED ORDER — LEVALBUTEROL HCL 0.63 MG/3ML IN NEBU
0.6300 mg | INHALATION_SOLUTION | Freq: Four times a day (QID) | RESPIRATORY_TRACT | Status: DC | PRN
  Administered 2016-03-11 (×2): 0.63 mg via RESPIRATORY_TRACT
  Filled 2016-03-10 (×2): qty 3

## 2016-03-10 MED ORDER — URSODIOL 300 MG PO CAPS
300.0000 mg | ORAL_CAPSULE | Freq: Two times a day (BID) | ORAL | Status: DC
Start: 2016-03-10 — End: 2016-03-14
  Administered 2016-03-10 – 2016-03-14 (×9): 300 mg via ORAL
  Filled 2016-03-10 (×11): qty 1

## 2016-03-10 MED ORDER — FUROSEMIDE 10 MG/ML IJ SOLN
20.0000 mg | Freq: Once | INTRAMUSCULAR | Status: AC
  Administered 2016-03-10: 20 mg via INTRAVENOUS
  Filled 2016-03-10: qty 2

## 2016-03-10 MED ORDER — METOPROLOL TARTRATE 1 MG/ML IV SOLN
2.5000 mg | Freq: Once | INTRAVENOUS | Status: AC
  Administered 2016-03-10: 2.5 mg via INTRAVENOUS
  Filled 2016-03-10: qty 5

## 2016-03-10 MED ORDER — METOPROLOL TARTRATE 25 MG PO TABS
25.0000 mg | ORAL_TABLET | Freq: Once | ORAL | Status: AC
  Administered 2016-03-10: 25 mg via ORAL
  Filled 2016-03-10: qty 1

## 2016-03-10 MED ORDER — METOPROLOL TARTRATE 25 MG PO TABS
50.0000 mg | ORAL_TABLET | Freq: Two times a day (BID) | ORAL | Status: DC
Start: 2016-03-10 — End: 2016-03-12
  Administered 2016-03-10 – 2016-03-11 (×3): 50 mg via ORAL
  Filled 2016-03-10: qty 2
  Filled 2016-03-10: qty 1
  Filled 2016-03-10 (×2): qty 2

## 2016-03-10 NOTE — Progress Notes (Addendum)
Notified Dr. Philbert Riser, on call for cardiology,of pt converting to NSR and having 2.32 and 2.45sec pauses prior to conversion. Caridizem gtt stopped at this time.

## 2016-03-10 NOTE — Progress Notes (Signed)
Report given to Sam in step down unit.  Patient transferred to unit, all belongings sent with patient.

## 2016-03-10 NOTE — Telephone Encounter (Signed)
New message   Pt son is calling to inform Dr.Crenshaw that his mother   Has been admitted into [facility] hospital and she has now been moved to ICU  She is having some heart issues and he wants to speak to the rn about them

## 2016-03-10 NOTE — Progress Notes (Signed)
Progress Note   Subjective  Patient had an ERCP yesterday with removal of debris / sludge / puss, but no obvious stones intact. She feels improved today, abdominal pain is much better. WBC and bilirubin downtrending.    Objective   Vital signs in last 24 hours: Temp:  [97.6 F (36.4 C)-98.4 F (36.9 C)] 97.6 F (36.4 C) (04/11 0605) Pulse Rate:  [71-81] 71 (04/11 0605) Resp:  [15-28] 18 (04/11 0605) BP: (128-148)/(48-106) 128/51 mmHg (04/11 0605) SpO2:  [96 %-98 %] 98 % (04/11 0605) Last BM Date: 03/08/16 General:    white female in NAD Heart:  Regular rate and rhythm; no murmurs Lungs: some coarse BS B at bases Abdomen:  Soft, nontender and nondistended. Normal bowel sounds. Extremities:  Trace to (+) 1 edema B. Neurologic:  Alert and oriented,  grossly normal neurologically. Psych:  Cooperative. Normal mood and affect.  Intake/Output from previous day: 04/10 0701 - 04/11 0700 In: 6381.3 [I.V.:6381.3] Out: 0  Intake/Output this shift:    Lab Results:  Recent Labs  03/08/16 2003 03/09/16 0510 03/10/16 0533  WBC 15.8* 27.1* 16.3*  HGB 14.6 12.1 11.1*  HCT 42.9 36.6 33.5*  PLT 249 207 168   BMET  Recent Labs  03/09/16 0213 03/09/16 0510 03/10/16 0533  NA 130* 132* 135  K 4.0 4.1 4.2  CL 98* 102 105  CO2 '25 23 25  '$ GLUCOSE 117* 126* 122*  BUN '11 11 11  '$ CREATININE 0.81 0.62 0.64  CALCIUM 8.4* 8.1* 8.2*   LFT  Recent Labs  03/10/16 0533  PROT 5.1*  ALBUMIN 2.6*  AST 63*  ALT 119*  ALKPHOS 94  BILITOT 2.6*   PT/INR  Recent Labs  03/09/16 0213  LABPROT 14.7  INR 1.18    Studies/Results: Ct Chest W Contrast  03/08/2016  CLINICAL DATA:  Mild dyspnea with upper abdominal pain. EXAM: CT CHEST WITH CONTRAST TECHNIQUE: Multidetector CT imaging of the chest was performed during intravenous contrast administration. CONTRAST:  28m ISOVUE-300 IOPAMIDOL (ISOVUE-300) INJECTION 61% COMPARISON:  08/15/2015 FINDINGS: There is stable postsurgical  irregularity on the right. There is a noncalcified 5 x 6 mm nodule in the left lower lobe posterior base. This has been present over the past several examinations dating back to at least 02/17/2010, when it measured 5.0 mm. No suspicious nodules are evident. There is prominent interlobular septal thickening throughout both lungs. This is new from 08/15/2015 and hit likely represents interstitial fluid. The vasculature also appears moderately prominent. This may represent congestive heart failure. No frank alveolar edema. No effusions. There is mild unchanged pleural thickening of the right posterior hemi thorax. There is no pathologic adenopathy.  Airways are patent. Upper abdomen is notable for pneumobilia, unchanged. There is unchanged dilatation of the thoracic aorta, measuring 4.4 cm in the ascending aorta and 3.8 cm in the descending. No dissection or other acute vascular abnormality. IMPRESSION: 1. Interlobular septal thickening throughout both lungs which is new, likely due to interstitial fluid. This could represent CHF as the vasculature also appears moderately prominent. 2. Stable left lower lobe posterior base nodule. No suspicious nodules. 3. Unchanged dilatation of the thoracic aorta. Electronically Signed   By: DAndreas NewportM.D.   On: 03/08/2016 21:44   Ct Abdomen Pelvis W Contrast  03/08/2016  CLINICAL DATA:  Acute onset of mid abdominal pain. Initial encounter. EXAM: CT ABDOMEN AND PELVIS WITH CONTRAST TECHNIQUE: Multidetector CT imaging of the abdomen and pelvis was performed using the standard  protocol following bolus administration of intravenous contrast. CONTRAST:  46m ISOVUE-300 IOPAMIDOL (ISOVUE-300) INJECTION 61% COMPARISON:  CTA of the chest, abdomen and pelvis performed 08/15/2015 FINDINGS: Bibasilar scarring is noted, with trace right-sided pleural fluid. A 7 mm nodule is noted at the left lung base. Periportal edema is noted. Diffuse pneumobilia is seen. There is dilatation of the  common bile duct to 1.9 cm in diameter. Would correlate for any evidence of postcholecystectomy syndrome. The liver is otherwise grossly unremarkable. The spleen is unremarkable in appearance. The patient is status post cholecystectomy. The pancreas and adrenal glands are grossly unremarkable. The kidneys are unremarkable in appearance. There is no evidence of hydronephrosis. No renal or ureteral stones are seen. No perinephric stranding is appreciated. No free fluid is identified. The small bowel is unremarkable in appearance. The stomach is within normal limits. No acute vascular abnormalities are seen. There is mild aneurysmal dilatation of the infrarenal abdominal aorta to 3.4 cm in transverse dimension. Scattered calcification is noted along the abdominal aorta and its branches. The patient is status post appendectomy. Scattered diverticulosis is noted along the transverse, descending and sigmoid colon, without evidence of diverticulitis. The bladder is moderately distended and partially filled with contrast. The uterus is grossly unremarkable. Scattered prominent periuterine vessels could reflect pelvic congestion syndrome in the appropriate clinical situation. The ovaries are relatively symmetric. No inguinal lymphadenopathy is seen. No acute osseous abnormalities are identified. IMPRESSION: 1. Periportal edema noted. Diffuse pneumobilia seen. Dilatation of the common bile duct to 1.9 cm in diameter. Would correlate for any evidence of postcholecystectomy syndrome. 2. Mild aneurysmal dilatation of the infrarenal abdominal aorta to 3.4 cm in transverse dimension. Scattered calcification along the abdominal aorta and its branches. Recommend followup by ultrasound in 3 years. This recommendation follows ACR consensus guidelines: White Paper of the ACR Incidental Findings Committee II on Vascular Findings. J Am Coll Radiol 2013; 170:962-8363. Scattered prominent periuterine vessels could reflect pelvic congestion  syndrome in the appropriate clinical situation. 4. Bibasilar scarring, with trace right-sided pleural fluid. 7 mm nodule at the left lung base. It is difficult to determine whether this has changed in size since the prior study. Non-contrast chest CT at 6-12 months is recommended. If the nodule is stable at time of repeat CT, then future CT at 18-24 months (from today's scan) is considered optional for low-risk patients, but is recommended for high-risk patients. This recommendation follows the consensus statement: Guidelines for Management of Incidental Pulmonary Nodules Detected on CT Images:From the Fleischner Society 2017; published online before print (10.1148/[id number]. 5. Scattered diverticulosis along the transverse, descending and sigmoid colon, without evidence of diverticulitis. Electronically Signed   By: JGarald BaldingM.D.   On: 03/08/2016 23:46   Dg Chest Port 1 View  03/10/2016  CLINICAL DATA:  Shortness of breath and wheezing, history of COPD, former smoker, coronary artery disease, valvular heart disease EXAM: PORTABLE CHEST 1 VIEW COMPARISON:  CT scan of the chest of March 08, 2016 FINDINGS: The lungs remain hyperinflated. The interstitial markings are diffusely increased and more conspicuous than on the earlier CT scan. Areas of patchy alveolar opacity are noted in the right upper and lower lobes. There is pleural thickening or pleural fluid along the right lateral thoracic wall. The left lung exhibits no alveolar infiltrate but the interstitial markings are increased diffusely. The cardiac silhouette is top-normal in size. There is mild central pulmonary vascular prominence. The patient has undergone previous median sternotomy. IMPRESSION: COPD with superimposed patchy interstitial and  alveolar pneumonia in the right lung with pleural fluid lying along the convexity of the right lung. Chronic underlying interstitial disease with increased conspicuity due to mild pulmonary interstitial  edema. Electronically Signed   By: David  Martinique M.D.   On: 03/10/2016 07:52   Dg Ercp  03/09/2016  CLINICAL DATA:  Diffuse biliary dilatation the common cholangitis EXAM: ERCP with sphincterotomy and balloon sweep TECHNIQUE: Multiple spot images obtained with the fluoroscopic device and submitted for interpretation post-procedure. FLUOROSCOPY TIME:  Number of Acquired Images:  10 COMPARISON:  03/08/2016 FINDINGS: Limited imaging during the ERCP procedure. There is diffuse severe dilatation of the common bile duct. Intrahepatic biliary dilatation noted. There is also pneumobilia. Balloon sweep performed of the dilated duct system. Access removed. No stent visualized or inserted. IMPRESSION: Limited imaging during the ERCP with balloon sweep and sphincterotomy. Diffuse biliary dilatation noted. Please refer to the ERCP report for further details. These images were submitted for radiologic interpretation only. Please see the procedural report for the amount of contrast and the fluoroscopy time utilized. Electronically Signed   By: Jerilynn Mages.  Shick M.D.   On: 03/09/2016 15:57       Assessment / Plan:   81 y/o female with history of choledocholithiasis s/p ERCP a few months ago, who presented with cholangitis and elevation in liver enzymes. Repeat ERCP done yesterday per Dr. Fuller Plan, debris / sludge / puss removed with balloon sweep although no clear obstructing stone. Since the procedure her WBC and bilirubin / ALT are downtrending. In discussion with her son yesterday, unclear how compliant the patient is with the Ursodiol as an outpatient. The patient reports taking it daily, although it appears she is underdosed. At her weight, the dose of 8-'10mg'$ /kg is recommended for gallstone dissolution, and should be roughly 576 to '720mg'$  per day divided into BID dosing. In this light, recommend Ursodiol at '300mg'$  BID moving forward to hopefully prevent further development of sludge / debris. I would otherwise continue antibiotics  for now, trend LFTs and CBC daily, and we will continue to follow. She can advance her diet as tolerated if pain free. Please call with questions / concerns.  Whitehall Cellar, MD [facility] Pager [phone]   LOS: 1 day   Renelda Loma Armbruster  03/10/2016, 10:07 AM

## 2016-03-10 NOTE — Progress Notes (Addendum)
PROGRESS NOTE  Laurie [name] JOA:[id number] DOB: [birth year] DOA: 03/08/2016 PCP: Beatrice Lecher, MD Outpatient Specialists:    LOS: 1 day   Brief Narrative: Laurie [name] is a 81 y.o. female with PMH of hypertension, hyperlipidemia, COPD, anxiety, atrial fibrillation not on anticoagulants possibly due to old age and high risk of fall, aortic valve replacement, lung cancer with lobectomy 2009, AAA, ascending aortic dilation, S/P cholecystectomy, who presents with abdominal pain.  Assessment & Plan: Principal Problem:   Abdominal pain Active Problems:   GAD (generalized anxiety disorder)   Essential hypertension, benign   S/P aortic valve replacement   S/P lobectomy of lung   Abdominal aortic aneurysm (HCC)   Ascending aorta dilatation (HCC)   Thoracic aortic aneurysm without rupture (HCC)   HTN (hypertension)   Sepsis (HCC)   COPD (chronic obstructive pulmonary disease) (HCC)   RUQ abdominal pain   Elevated LFTs   Biliary calculus  Abdominal pain and sepsis with ascending cholangitis - CT-abd/pelvis showed periportal edema, diffuse pneumobilia, dlatation of the common bile duct to 1.9 cm in diameter, indicating common duct obstruction in the biliary infection - Of note, she was hospitalized in February 2017 with similar problem requiring ERCP at that time as well and had gram-negative bacteremia on cultures for [facility] [place], speciation unavailable, - Gastroenterology consulted, patient underwent an ERCP on 4/10 with removal of debris/pus but without any obvious stones - Abdominal pain improved today, LFTs improving - Advance diet as tolerated - Continue Zosyn, cultures pending  Acute hypoxic respiratory failure with chest pain - Patient found to be having difficulties breathing this morning, new oxygen requirement, lung exam shows perfuse wheezing, she appears fluid overloaded. Discontinue IV fluids, IV Lasix. - chest discomfort, EKG similar to  prior - Chest x-ray showed interstitial edema, we'll provide IV Lasix  Initial troponin 0.04, likely demand, continue to cycle  Addendum 1:14 pm. Called due to A fib with RVR, patient evaluated, asymptomatic. It appears that at home she is on Metoprolol 75 BID, here on 25 BID. Increase to 50 BID. Cardiology consulted.   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    DVT prophylaxis: SCD Code Status: DNR Family Communication: no family bedside Disposition Plan: home when ready  Barriers for discharge: IV antibiotics, hypoxia  Consultants:   GI  Procedures:   None   Antimicrobials:  Zosyn    Subjective: - feeling short bf breath, sitting at the edge of the bed  Objective: Filed Vitals:   03/09/16 1440 03/09/16 1450 03/09/16 2136 03/10/16 0605  BP: 138/54 139/55 128/48 128/51  Pulse: 76 72 74 71  Temp:   98.4 F (36.9 C) 97.6 F (36.4 C)  TempSrc:   Oral Oral  Resp: '15 17 17 18  '$ Weight:      SpO2: 96% 98% 97% 98%    Intake/Output Summary (  Last 24 hours) at 03/10/16 1230 Last data filed at 03/09/16 1900  Gross per 24 hour  Intake 1837.5 ml  Output      0 ml  Net 1837.5 ml   Filed Weights   03/09/16 0050  Weight: 71.8 kg (158 lb 4.6 oz)    Examination: Constitutional: Anxious, tachypneic Filed Vitals:   03/09/16 1440 03/09/16 1450  03/09/16 2136 03/10/16 0605  BP: 138/54 139/55 128/48 128/51  Pulse: 76 72 74 71  Temp:   98.4 F (36.9 C) 97.6 F (36.4 C)  TempSrc:   Oral Oral  Resp: '15 17 17 18  '$ Weight:      SpO2: 96% 98% 97% 98%   Eyes: PERRL ENMT: Mucous membranes are moist.  Respiratory: Diffuse bilateral wheezing, bibasilar crackles. Increased respiratory effort. + accessory muscle use.  Cardiovascular: Regular rate and rhythm, no murmurs / rubs / gallops. No extremity edema. 2+ pedal pulses. No carotid bruits.  Abdomen: no tenderness, Musculoskeletal: no clubbing / cyanosis.  Neurologic: Nonfocal Psychiatric: Anxious   Data Reviewed: I have personally reviewed following labs and imaging studies  CBC:  Recent Labs Lab 03/08/16 2003 03/09/16 0510 03/10/16 0533  WBC 15.8* 27.1* 16.3*  HGB 14.6 12.1 11.1*  HCT 42.9 36.6 33.5*  MCV 84.8 86.5 87.0  PLT 249 207 709   Basic Metabolic Panel:  Recent Labs Lab 03/08/16 2003 03/09/16 0213 03/09/16 0510 03/10/16 0533  NA 132* 130* 132* 135  K 3.8 4.0 4.1 4.2  CL 95* 98* 102 105  CO2 '26 25 23 25  '$ GLUCOSE 140* 117* 126* 122*  BUN '11 11 11 11  '$ CREATININE 0.71 0.81 0.62 0.64  CALCIUM 9.2 8.4* 8.1* 8.2*   GFR: Estimated Creatinine Clearance: 56.4 mL/min (by C-G formula based on Cr of 0.64). Liver Function Tests:  Recent Labs Lab 03/08/16 2003 03/09/16 0213 03/09/16 0510 03/10/16 0533  AST 323* 229* 176* 63*  ALT 234* 243* 209* 119*  ALKPHOS 123 118 106 94  BILITOT 1.8* 2.9* 3.0* 2.6*  PROT 7.0 6.0* 5.4* 5.1*  ALBUMIN 3.9 3.2* 2.9* 2.6*    Recent Labs Lab 03/08/16 2003 03/09/16 0505  LIPASE 21 22   No results for input(s): AMMONIA in the last 168 hours. Coagulation Profile:  Recent Labs Lab 03/09/16 0213  INR 1.18   Cardiac Enzymes:  Recent Labs Lab 03/08/16 2159 03/10/16 0859  TROPONINI <0.03 0.04*   BNP (last 3 results) No results for input(s): PROBNP in the last 8760 hours. HbA1C: No results for input(s):  HGBA1C in the last 72 hours. CBG:  Recent Labs Lab 03/09/16 0728 03/10/16 0812  GLUCAP 108* 123*   Lipid Profile: No results for input(s): CHOL, HDL, LDLCALC, TRIG, CHOLHDL, LDLDIRECT in the last 72 hours. Thyroid Function Tests: No results for input(s): TSH, T4TOTAL, FREET4, T3FREE, THYROIDAB in the last 72 hours. Anemia Panel: No results for input(s): VITAMINB12, FOLATE, FERRITIN, TIBC, IRON, RETICCTPCT in the last 72 hours. Urine analysis:    Component Value Date/Time   COLORURINE YELLOW 03/08/2016 [place] 03/08/2016 2201   LABSPEC 1.027 03/08/2016 2201   PHURINE 7.0 03/08/2016 2201   GLUCOSEU NEGATIVE 03/08/2016 2201   HGBUR NEGATIVE 03/08/2016 2201   HGBUR trace-intact 06/06/2008 0907   BILIRUBINUR NEGATIVE 03/08/2016 2201   BILIRUBINUR neg 03/18/2011 1530   KETONESUR NEGATIVE 03/08/2016 2201   PROTEINUR NEGATIVE 03/08/2016 2201   PROTEINUR trace 03/18/2011 1530   UROBILINOGEN 0.2 07/13/2011 1832   UROBILINOGEN 0.2 03/18/2011 1530   NITRITE  NEGATIVE 03/08/2016 2201   NITRITE neg 03/18/2011 1530   LEUKOCYTESUR NEGATIVE 03/08/2016 2201   Sepsis Labs: Invalid input(s): PROCALCITONIN, LACTICIDVEN  Recent Results (from the past 240 hour(s))  Culture, blood (x 2)     Status: None (Preliminary result)   Collection Time: 03/09/16  2:20 AM  Result Value Ref Range Status   Specimen Description BLOOD LEFT ARM  Final   Special Requests   Final    BOTTLES DRAWN AEROBIC AND ANAEROBIC 5CC Performed at [facility]    Culture PENDING  Incomplete   Report Status PENDING  Incomplete  Culture, blood (x 2)     Status: None (Preliminary result)   Collection Time: 03/09/16  2:25 AM  Result Value Ref Range Status   Specimen Description BLOOD BLOOD LEFT HAND  Final   Special Requests   Final    BOTTLES DRAWN AEROBIC AND ANAEROBIC 5CC Performed at [facility]    Culture PENDING  Incomplete   Report Status PENDING  Incomplete      Radiology  Studies: Ct Chest W Contrast  03/08/2016  CLINICAL DATA:  Mild dyspnea with upper abdominal pain. EXAM: CT CHEST WITH CONTRAST TECHNIQUE: Multidetector CT imaging of the chest was performed during intravenous contrast administration. CONTRAST:  47m ISOVUE-300 IOPAMIDOL (ISOVUE-300) INJECTION 61% COMPARISON:  08/15/2015 FINDINGS: There is stable postsurgical irregularity on the right. There is a noncalcified 5 x 6 mm nodule in the left lower lobe posterior base. This has been present over the past several examinations dating back to at least 02/17/2010, when it measured 5.0 mm. No suspicious nodules are evident. There is prominent interlobular septal thickening throughout both lungs. This is new from 08/15/2015 and hit likely represents interstitial fluid. The vasculature also appears moderately prominent. This may represent congestive heart failure. No frank alveolar edema. No effusions. There is mild unchanged pleural thickening of the right posterior hemi thorax. There is no pathologic adenopathy.  Airways are patent. Upper abdomen is notable for pneumobilia, unchanged. There is unchanged dilatation of the thoracic aorta, measuring 4.4 cm in the ascending aorta and 3.8 cm in the descending. No dissection or other acute vascular abnormality. IMPRESSION: 1. Interlobular septal thickening throughout both lungs which is new, likely due to interstitial fluid. This could represent CHF as the vasculature also appears moderately prominent. 2. Stable left lower lobe posterior base nodule. No suspicious nodules. 3. Unchanged dilatation of the thoracic aorta. Electronically Signed   By: DAndreas NewportM.D.   On: 03/08/2016 21:44   Ct Abdomen Pelvis W Contrast  03/08/2016  CLINICAL DATA:  Acute onset of mid abdominal pain. Initial encounter. EXAM: CT ABDOMEN AND PELVIS WITH CONTRAST TECHNIQUE: Multidetector CT imaging of the abdomen and pelvis was performed using the standard protocol following bolus administration of  intravenous contrast. CONTRAST:  868mISOVUE-300 IOPAMIDOL (ISOVUE-300) INJECTION 61% COMPARISON:  CTA of the chest, abdomen and pelvis performed 08/15/2015 FINDINGS: Bibasilar scarring is noted, with trace right-sided pleural fluid. A 7 mm nodule is noted at the left lung base. Periportal edema is noted. Diffuse pneumobilia is seen. There is dilatation of the common bile duct to 1.9 cm in diameter. Would correlate for any evidence of postcholecystectomy syndrome. The liver is otherwise grossly unremarkable. The spleen is unremarkable in appearance. The patient is status post cholecystectomy. The pancreas and adrenal glands are grossly unremarkable. The kidneys are unremarkable in appearance. There is no evidence of hydronephrosis. No renal or ureteral stones are seen. No perinephric stranding is appreciated. No  free fluid is identified. The small bowel is unremarkable in appearance. The stomach is within normal limits. No acute vascular abnormalities are seen. There is mild aneurysmal dilatation of the infrarenal abdominal aorta to 3.4 cm in transverse dimension. Scattered calcification is noted along the abdominal aorta and its branches. The patient is status post appendectomy. Scattered diverticulosis is noted along the transverse, descending and sigmoid colon, without evidence of diverticulitis. The bladder is moderately distended and partially filled with contrast. The uterus is grossly unremarkable. Scattered prominent periuterine vessels could reflect pelvic congestion syndrome in the appropriate clinical situation. The ovaries are relatively symmetric. No inguinal lymphadenopathy is seen. No acute osseous abnormalities are identified. IMPRESSION: 1. Periportal edema noted. Diffuse pneumobilia seen. Dilatation of the common bile duct to 1.9 cm in diameter. Would correlate for any evidence of postcholecystectomy syndrome. 2. Mild aneurysmal dilatation of the infrarenal abdominal aorta to 3.4 cm in transverse  dimension. Scattered calcification along the abdominal aorta and its branches. Recommend followup by ultrasound in 3 years. This recommendation follows ACR consensus guidelines: White Paper of the ACR Incidental Findings Committee II on Vascular Findings. J Am Coll Radiol 2013; 88:502-774 3. Scattered prominent periuterine vessels could reflect pelvic congestion syndrome in the appropriate clinical situation. 4. Bibasilar scarring, with trace right-sided pleural fluid. 7 mm nodule at the left lung base. It is difficult to determine whether this has changed in size since the prior study. Non-contrast chest CT at 6-12 months is recommended. If the nodule is stable at time of repeat CT, then future CT at 18-24 months (from today's scan) is considered optional for low-risk patients, but is recommended for high-risk patients. This recommendation follows the consensus statement: Guidelines for Management of Incidental Pulmonary Nodules Detected on CT Images:From the Fleischner Society 2017; published online before print (10.1148/radiol.1287867672). 5. Scattered diverticulosis along the transverse, descending and sigmoid colon, without evidence of diverticulitis. Electronically Signed   By: Garald Balding M.D.   On: 03/08/2016 23:46   Dg Chest Port 1 View  03/10/2016  CLINICAL DATA:  Shortness of breath and wheezing, history of COPD, former smoker, coronary artery disease, valvular heart disease EXAM: PORTABLE CHEST 1 VIEW COMPARISON:  CT scan of the chest of March 08, 2016 FINDINGS: The lungs remain hyperinflated. The interstitial markings are diffusely increased and more conspicuous than on the earlier CT scan. Areas of patchy alveolar opacity are noted in the right upper and lower lobes. There is pleural thickening or pleural fluid along the right lateral thoracic wall. The left lung exhibits no alveolar infiltrate but the interstitial markings are increased diffusely. The cardiac silhouette is top-normal in size.  There is mild central pulmonary vascular prominence. The patient has undergone previous median sternotomy. IMPRESSION: COPD with superimposed patchy interstitial and alveolar pneumonia in the right lung with pleural fluid lying along the convexity of the right lung. Chronic underlying interstitial disease with increased conspicuity due to mild pulmonary interstitial edema. Electronically Signed   By: David  Martinique M.D.   On: 03/10/2016 07:52   Dg Ercp  03/09/2016  CLINICAL DATA:  Diffuse biliary dilatation the common cholangitis EXAM: ERCP with sphincterotomy and balloon sweep TECHNIQUE: Multiple spot images obtained with the fluoroscopic device and submitted for interpretation post-procedure. FLUOROSCOPY TIME:  Number of Acquired Images:  10 COMPARISON:  03/08/2016 FINDINGS: Limited imaging during the ERCP procedure. There is diffuse severe dilatation of the common bile duct. Intrahepatic biliary dilatation noted. There is also pneumobilia. Balloon sweep performed of the dilated duct system. Access  removed. No stent visualized or inserted. IMPRESSION: Limited imaging during the ERCP with balloon sweep and sphincterotomy. Diffuse biliary dilatation noted. Please refer to the ERCP report for further details. These images were submitted for radiologic interpretation only. Please see the procedural report for the amount of contrast and the fluoroscopy time utilized. Electronically Signed   By: Jerilynn Mages.  Shick M.D.   On: 03/09/2016 15:57    Scheduled Meds: . loratadine  10 mg Oral Daily  . metoprolol  25 mg Oral BID  . piperacillin-tazobactam (ZOSYN)  IV  3.375 g Intravenous Q8H  . psyllium  1 packet Oral Daily  . sodium chloride flush  3 mL Intravenous Q12H  . ursodiol  300 mg Oral BID   Continuous Infusions:    Marzetta Board, MD, PhD [facility] Pager [phone]  If 7PM-7AM, please contact night-coverage [email or web address] Password [email or web address] 03/10/2016, 12:30 PM

## 2016-03-10 NOTE — Progress Notes (Signed)
OT Cancellation Note  Patient Details Name: Laurie [name] MRN: [id number] DOB: November 26, 1935   Cancelled Treatment:    Reason Eval/Treat Not Completed: Medical issues which prohibited therapy. RN states pt is unstable with increased HR. Cardiologist present. Will check back as schedule allows and as appropriate for therapy. Thank you for the order.   Chrys Racer , MS, OTR/L, CLT Pager: [phone]  03/10/2016, 3:28 PM

## 2016-03-10 NOTE — Telephone Encounter (Signed)
Returned call. Caller aware cardiology rounding on patient. All questions addressed.

## 2016-03-10 NOTE — Consult Note (Signed)
CARDIOLOGY CONSULT NOTE   Patient ID: Laurie [name] MRN: [id number] DOB/AGE: 1935-01-22 81 y.o.  Admit date: 03/08/2016  Primary Physician   [name],CATHERINE, [name] Primary Cardiologist   Laurie [name] Reason for Consultation  Chest discomfort and atrial fib   QAS:[id number] Laurie [name] is Laurie 81 y.o. year old female with Laurie history of hypertension, hyperlipidemia, COPD, anxiety, atrial fibrillation not on anticoagulants possibly due to old age and high risk of fall, aortic valve replacement with Laurie tissue valve, lung cancer with lobectomy 2009, AAA, ascending aortic dilation, cholecystectomy.   Admitted 04/10 with abdominal pain. Seen by GI and ERCP for CBD stones was performed. Cholangitis, CBD sludge and pneumobilia were seen. She was diagnosed with sepsis 2nd ascending cholangitis.    04/11, pt was SOB, wheezing, and was given IV Lasix. This pm, pt went into rapid atrial fib and had some chest pain. Cards was consulted  Laurie [name] stated she felt well until Sunday. She developed the abdominal pain and knew what it was, she had had it before. Her abdominal symptoms have improved, but she is still tender and sore in her RUQ. She also still has some abdominal pain. She is SOB and wheezing, but this is better on the O2. She is not aware of the atrial fib, she does not feel her heart rate being rapid or irregular.  She is not currently having any chest pain, but the abdominal pain is worse when she takes Laurie deep breath. She is not coughing. She has not been wheezing lately, until today.    Past Medical History  Diagnosis Date  . Anemia     iron deficiency  . Shingles 11-09  . COPD (chronic obstructive pulmonary disease) ([place]) 2005    on CXR with Laurie apical scaring  . Atrial fibrillation ([place])     post op  . Hypertension   . Cerebrovascular disease   . S/P aortic valve replacement 08/28/2008    [facility]  . S/P lobectomy of lung 08/28/2008    rt middle lobe  Laurie. Roxy [name]  . AAA (abdominal  aortic aneurysm) ([place])   . CAD (coronary artery disease)   . Hypercholesterolemia     Hx: of  . Headache(784.0)     Hx: of migraines in the past  . Carcinoma of lung ([place]) 08/28/2008    T2N0 moderately diff. squamous cell CA  . Ascending aorta dilatation [facility])      Past Surgical History  Procedure Laterality Date  . Tonsillectomy and adenoidectomy    . U/s guided aspiration of Laurie breast cyst   2009    at the breast clinic  . Rml removed Laurie [name] for lung cancer  08/28/2008    T2N0 squamous cell CA  . Abdominal surgery    . Aortic valve replacement  08/28/2008    [facility]  . Colonoscopy      \  . Cataract extraction w/ intraocular lens  implant, bilateral Bilateral   . Dilation and curettage of uterus    . Cardiac catheterization    . Appendectomy    . Cholecystectomy N/Laurie 12/13/2013    Procedure: LAPAROSCOPIC CHOLECYSTECTOMY;  Surgeon: Laurie [name];  [facility]  Service: General;  Laterality: N/Laurie;  . Ercp N/Laurie 05/08/2015    Procedure: ENDOSCOPIC RETROGRADE CHOLANGIOPANCREATOGRAPHY (ERCP);  Surgeon: RInda Castle [name];  [facility]  Service: Endoscopy;  Laterality: N/Laurie;  . Ercp N/Laurie 01/13/2016    Procedure:  ENDOSCOPIC RETROGRADE CHOLANGIOPANCREATOGRAPHY (ERCP);  Surgeon: Laurie [name];  Location: [facility];  Service: Endoscopy;  Laterality: N/Laurie;    Allergies  Allergen Reactions  . Hyoscyamine Other (See Comments)    Mental status changes.  (as of 02/13/16 patient denies any problems with this medication)  . Statins     Muscle aches  . Rofecoxib Rash    I have reviewed the patient's current medications . loratadine  10 mg Oral Daily  . metoprolol  50 mg Oral BID  . piperacillin-tazobactam (ZOSYN)  IV  3.375 g Intravenous Q8H  . psyllium  1 packet Oral Daily  . sodium chloride flush  3 mL Intravenous Q12H  . ursodiol  300 mg Oral BID     ALPRAZolam, aspirin, dicyclomine, hydrALAZINE, iohexol,  levalbuterol, morphine injection, ondansetron, traMADol  Medication Sig  acetaminophen (TYLENOL) 500 MG tablet Take 500 mg by mouth every 6 (six) hours as needed for moderate pain, fever or headache. Pain   ALPRAZolam (XANAX) 0.5 MG tablet Take 1 tablet (0.5 mg total) by mouth 2 (two) times daily. Patient taking differently: Take 0.5 mg by mouth 2 (two) times daily as needed.   aspirin 81 MG tablet Take 81 mg by mouth daily as needed for pain.   dicyclomine (BENTYL) 20 MG tablet Take 0.5-1 tablets (10-20 mg total) by mouth 2 (two) times daily as needed for spasms.  lisinopril (PRINIVIL,ZESTRIL) 40 MG tablet TAKE 1 TABLET (40 MG TOTAL) BY MOUTH DAILY.  metoprolol (LOPRESSOR) 50 MG tablet Take 1.5 tablets (75 mg total) by mouth 2 (two) times daily.  psyllium (METAMUCIL) 58.6 % packet Take 1 packet by mouth daily.  traMADol (ULTRAM) 50 MG tablet TAKE 1 TABLET BY MOUTH EVERY 8 HOURS AS NEEDED FOR PAIN Patient taking differently: TAKE 50 MG BY MOUTH EVERY 8 HOURS AS NEEDED FOR PAIN  ursodiol (ACTIGALL) 500 MG tablet Take 1 tablet (500 mg total) by mouth daily.  polyethylene glycol powder (GLYCOLAX/MIRALAX) powder Take 1-2 capfuls daily in water Patient not taking: Reported on 03/08/2016     Social History   Social History  . Marital Status: Single    Spouse Name: N/Laurie  . Number of Children: N/Laurie  . Years of Education: N/Laurie   Occupational History  . Not on file.   Social History Main Topics  . Smoking status: Former Smoker -- 40 years  . Smokeless tobacco: Never Used  . Alcohol Use: No  . Drug Use: No  . Sexual Activity: Not on file   Other Topics Concern  . Not on file   Social History Narrative    Family Status  Relation Status Death Age  . Mother Deceased 2    old age  . Father Deceased 50  . Sister      unknown  . Brother Deceased     accident  . Son Alive     lives w/ pt  . Sister      unknown  . Sister      unknown  . Son Alive     lives in [place] w/ 3  girls  . Maternal Grandmother Deceased   . Maternal Grandfather Deceased   . Paternal Grandmother Deceased   . Paternal Grandfather Deceased    Family History  Problem Relation Age of Onset  . Stroke Father   . Diabetes Son     brittle diabetes  . Colon polyps Neg Hx   . Colon cancer Neg Hx   . Esophageal cancer  Neg Hx   . Stomach cancer Neg Hx   . Pancreatic cancer Neg Hx   . Kidney disease Neg Hx   . Liver disease Neg Hx      ROS:  Full 14 point review of systems complete and found to be negative unless listed above.  Physical Exam: Blood pressure 127/74, pulse 145, temperature 98.1 F (36.7 C), temperature source Oral, resp. rate 20, weight 158 lb 4.6 oz (71.8 kg), SpO2 99 %.  General: Well developed, well nourished, elderly  female in mild respiratory distress Head: Eyes PERRLA, No xanthomas. Normocephalic and atraumatic, oropharynx without edema or exudate. Dentition: poor Lungs: insp/exp wheeze, basilar rales Heart: Heart irregular rate and rhythm with S1, S2; soft murmur. pulses are 2+ all 4 extrem.   Neck: No carotid bruits. No lymphadenopathy.  JVD 9 cm. Abdomen: Bowel sounds present, abdomen soft and non-tender without masses or hernias noted. Msk:  No spine or cva tenderness. No weakness, no joint deformities or effusions. Extremities: No clubbing or cyanosis. No edema.  Neuro: Alert and oriented X 3. No focal deficits noted. Psych:  Good affect, responds appropriately Skin: No rashes or lesions noted.  Labs:   Lab Results  Component Value Date   WBC 16.3* 03/10/2016   HGB 11.1* 03/10/2016   HCT 33.5* 03/10/2016   MCV 87.0 03/10/2016   PLT 168 03/10/2016    Recent Labs  03/09/16 0213  INR 1.18    Recent Labs Lab 03/10/16 0533  NA 135  K 4.2  CL 105  CO2 25  BUN 11  CREATININE 0.64  CALCIUM 8.2*  PROT 5.1*  BILITOT 2.6*  ALKPHOS 94  ALT 119*  AST 63*  GLUCOSE 122*  ALBUMIN 2.6*    Recent Labs  03/08/16 2159 03/10/16 0859  TROPONINI  <0.03 0.04*   B NATRIURETIC PEPTIDE  Date/Time Value Ref Range Status  03/08/2016 09:59 PM 115.5* 0.0 - 100.0 pg/mL Final   LIPASE  Date/Time Value Ref Range Status  03/09/2016 05:05 AM 22 11 - 51 U/L Final   Echo: 04/04/2014 - Left ventricle: The cavity size was normal. Wall thickness was increased in Laurie pattern of moderate LVH. Systolic function was normal. The estimated ejection fraction was in the range of 60% to 65%. Wall motion was normal; there were no regional wall motion abnormalities. Doppler parameters are consistent with abnormal left ventricular relaxation (grade 1 diastolic dysfunction). - Aortic valve: There was Laurie bioprosthetic aortic valve. No significant bioprosthetic aortic valve regurgitation or stenosis. Peak gradient: 23m Hg (S). - Mitral valve: No significant regurgitation. - Left atrium: The atrium was mildly dilated. - Right ventricle: The cavity size was normal. Systolic function was normal. - Pulmonary arteries: No complete TR doppler jet so unable to estimate PA systolic pressure. - Inferior vena cava: The vessel was normal in size; the respirophasic diameter changes were in the normal range (= 50%); findings are consistent with normal central venouspressure. Impressions: - Technically difficult study with poor acoustic windows. Normal LV size with moderate LV hypertrophy. EF 60-65%. Normal RV size and systolic function. There was Laurie bioprosthetic aortic valve that appeared to have normal function.  ECG:  03/10/2016 pm Atrial fib, RVR, HR 145  Radiology:  Ct Chest W Contrast 03/08/2016  CLINICAL DATA:  Mild dyspnea with upper abdominal pain. EXAM: CT CHEST WITH CONTRAST TECHNIQUE: Multidetector CT imaging of the chest was performed during intravenous contrast administration. CONTRAST:  861mISOVUE-300 IOPAMIDOL (ISOVUE-300) INJECTION 61% COMPARISON:  08/15/2015 FINDINGS: There is  stable postsurgical irregularity on the  right. There is Laurie noncalcified 5 x 6 mm nodule in the left lower lobe posterior base. This has been present over the past several examinations dating back to at least 02/17/2010, when it measured 5.0 mm. No suspicious nodules are evident. There is prominent interlobular septal thickening throughout both lungs. This is new from 08/15/2015 and hit likely represents interstitial fluid. The vasculature also appears moderately prominent. This may represent congestive heart failure. No frank alveolar edema. No effusions. There is mild unchanged pleural thickening of the right posterior hemi thorax. There is no pathologic adenopathy.  Airways are patent. Upper abdomen is notable for pneumobilia, unchanged. There is unchanged dilatation of the thoracic aorta, measuring 4.4 cm in the ascending aorta and 3.8 cm in the descending. No dissection or other acute vascular abnormality. IMPRESSION: 1. Interlobular septal thickening throughout both lungs which is new, likely due to interstitial fluid. This could represent CHF as the vasculature also appears moderately prominent. 2. Stable left lower lobe posterior base nodule. No suspicious nodules. 3. Unchanged dilatation of the thoracic aorta. Electronically Signed   By: Andreas Newport M.D.   On: 03/08/2016 21:44   Ct Abdomen Pelvis W Contrast 03/08/2016  CLINICAL DATA:  Acute onset of mid abdominal pain. Initial encounter. EXAM: CT ABDOMEN AND PELVIS WITH CONTRAST TECHNIQUE: Multidetector CT imaging of the abdomen and pelvis was performed using the standard protocol following bolus administration of intravenous contrast. CONTRAST:  45m ISOVUE-300 IOPAMIDOL (ISOVUE-300) INJECTION 61% COMPARISON:  CTA of the chest, abdomen and pelvis performed 08/15/2015 FINDINGS: Bibasilar scarring is noted, with trace right-sided pleural fluid. Laurie 7 mm nodule is noted at the left lung base. Periportal edema is noted. Diffuse pneumobilia is seen. There is dilatation of the common bile duct to  1.9 cm in diameter. Would correlate for any evidence of postcholecystectomy syndrome. The liver is otherwise grossly unremarkable. The spleen is unremarkable in appearance. The patient is status post cholecystectomy. The pancreas and adrenal glands are grossly unremarkable. The kidneys are unremarkable in appearance. There is no evidence of hydronephrosis. No renal or ureteral stones are seen. No perinephric stranding is appreciated. No free fluid is identified. The small bowel is unremarkable in appearance. The stomach is within normal limits. No acute vascular abnormalities are seen. There is mild aneurysmal dilatation of the infrarenal abdominal aorta to 3.4 cm in transverse dimension. Scattered calcification is noted along the abdominal aorta and its branches. The patient is status post appendectomy. Scattered diverticulosis is noted along the transverse, descending and sigmoid colon, without evidence of diverticulitis. The bladder is moderately distended and partially filled with contrast. The uterus is grossly unremarkable. Scattered prominent periuterine vessels could reflect pelvic congestion syndrome in the appropriate clinical situation. The ovaries are relatively symmetric. No inguinal lymphadenopathy is seen. No acute osseous abnormalities are identified. IMPRESSION: 1. Periportal edema noted. Diffuse pneumobilia seen. Dilatation of the common bile duct to 1.9 cm in diameter. Would correlate for any evidence of postcholecystectomy syndrome. 2. Mild aneurysmal dilatation of the infrarenal abdominal aorta to 3.4 cm in transverse dimension. Scattered calcification along the abdominal aorta and its branches. Recommend followup by ultrasound in 3 years. This recommendation follows ACR consensus guidelines: White Paper of the ACR Incidental Findings Committee II on Vascular Findings. J Am Coll Radiol 2013; 102:409-7353. Scattered prominent periuterine vessels could reflect pelvic congestion syndrome in the  appropriate clinical situation. 4. Bibasilar scarring, with trace right-sided pleural fluid. 7 mm nodule at the left lung base.  It is difficult to determine whether this has changed in size since the prior study. Non-contrast chest CT at 6-12 months is recommended. If the nodule is stable at time of repeat CT, then future CT at 18-24 months (from today's scan) is considered optional for low-risk patients, but is recommended for high-risk patients. This recommendation follows the consensus statement: Guidelines for Management of Incidental Pulmonary Nodules Detected on CT Images:From the Fleischner Society 2017; published online before print (10.1148/radiol.5643329518). 5. Scattered diverticulosis along the transverse, descending and sigmoid colon, without evidence of diverticulitis. Electronically Signed   By: Garald Balding M.D.   On: 03/08/2016 23:46   Dg Chest Port 1 View 03/10/2016  CLINICAL DATA:  Shortness of breath and wheezing, history of COPD, former smoker, coronary artery disease, valvular heart disease EXAM: PORTABLE CHEST 1 VIEW COMPARISON:  CT scan of the chest of March 08, 2016 FINDINGS: The lungs remain hyperinflated. The interstitial markings are diffusely increased and more conspicuous than on the earlier CT scan. Areas of patchy alveolar opacity are noted in the right upper and lower lobes. There is pleural thickening or pleural fluid along the right lateral thoracic wall. The left lung exhibits no alveolar infiltrate but the interstitial markings are increased diffusely. The cardiac silhouette is top-normal in size. There is mild central pulmonary vascular prominence. The patient has undergone previous median sternotomy. IMPRESSION: COPD with superimposed patchy interstitial and alveolar pneumonia in the right lung with pleural fluid lying along the convexity of the right lung. Chronic underlying interstitial disease with increased conspicuity due to mild pulmonary interstitial edema.  Electronically Signed   By: David  Martinique M.D.   On: 03/10/2016 07:52   Dg Ercp 03/09/2016  CLINICAL DATA:  Diffuse biliary dilatation the common cholangitis EXAM: ERCP with sphincterotomy and balloon sweep TECHNIQUE: Multiple spot images obtained with the fluoroscopic device and submitted for interpretation post-procedure. FLUOROSCOPY TIME:  Number of Acquired Images:  10 COMPARISON:  03/08/2016 FINDINGS: Limited imaging during the ERCP procedure. There is diffuse severe dilatation of the common bile duct. Intrahepatic biliary dilatation noted. There is also pneumobilia. Balloon sweep performed of the dilated duct system. Access removed. No stent visualized or inserted. IMPRESSION: Limited imaging during the ERCP with balloon sweep and sphincterotomy. Diffuse biliary dilatation noted. Please refer to the ERCP report for further details. These images were submitted for radiologic interpretation only. Please see the procedural report for the amount of contrast and the fluoroscopy time utilized. Electronically Signed   By: Jerilynn Mages.  Shick M.D.   On: 03/09/2016 15:57    ASSESSMENT AND PLAN:   The patient was seen today by Laurie Percival Spanish, the patient evaluated and the data reviewed.   1. Chest pain - symptoms are atypical and in the setting of rapid atrial fib - currently, troponin is only minimally elevated, c/w rapid afib. - last cath was 2009 (pre-AVR), minimal coronary plaque - if enzymes do not go much higher, do not think ischemic eval needed  2.   Atrial fibrillation with RVR ([place]) - understandable in the setting of acute illness - home BB was also decreased to avoid hypotension - BB has been increased from 25 mg bid to 50 mg bid, 75 mg bid is home dose. - continue to follow rate, with wheezing, may need to change to Cardizem, at least temporarily. - [name] advise on IV Cardizem, will try 5-10 mg IV as bolus to see if this helps HR - can document duration, would not need TEE to do DCCV if  pt becomes  unstable.  3. Possible acute diastolic CHF - she was not on diuretics PTA - I/O + by 6 L prior to am Lasix 20 mg - BP has been stable, would continue this bid until I/O net even - follow daily weights  Otherwise, per IM Principal Problem:   Abdominal pain Active Problems:   GAD (generalized anxiety disorder)   Essential hypertension, benign   S/P aortic valve replacement   S/P lobectomy of lung    Abdominal aortic aneurysm ([facility])   Ascending aorta dilatation ([facility])   Thoracic aortic aneurysm without rupture ([facility])   HTN (hypertension)   Sepsis ([facility])   COPD (chronic obstructive pulmonary disease) ([facility])   RUQ abdominal pain   Elevated LFTs   Biliary calculus   Acute respiratory failure with hypoxia ([place])  SignedRosaria Ferries, PA-C 03/10/2016 1:54 PM Beeper [phone]  [name]   History and all data above reviewed.  Patient examined.  I agree with the findings as above.  The patient developed increased SOB last night and this morning.  Also with some increased right upper quadrant abdominal pain.  She subsequently has developed atrial fib.  She is up several liters of fluid since admission.  The patient exam reveals QQV:[id number]  ,  Lungs: Decreased breath sounds  ,  Abd: Positive bowel sounds, no rebound no guarding, Ext No edema  .  All available labs, radiology testing, previous records reviewed. Agree with documented assessment and plan. Acute dyspnea:  Likely secondary to increased volume.  We will gently diurese her.  She has Laurie marginal blood pressure.  Also consider further bronchodilators.  I do not think that the atrial fib was the primary event but rather Laurie secondary event.  Atrial fib:  Control pain and work on her breathing.  Discussed with primary team and anticoagulation is currently not an option.  We will consider further need for rate control when she has had treatment of fluid, bronchoconstriction and pain.   Jeneen Rinks Jericka Kadar  5:28 PM  03/10/2016

## 2016-03-11 ENCOUNTER — Encounter (HOSPITAL_COMMUNITY): Payer: Self-pay | Admitting: Gastroenterology

## 2016-03-11 ENCOUNTER — Other Ambulatory Visit (HOSPITAL_COMMUNITY): Payer: Commercial Managed Care - HMO

## 2016-03-11 DIAGNOSIS — K8309 Other cholangitis: Secondary | ICD-10-CM | POA: Diagnosis present

## 2016-03-11 DIAGNOSIS — I77810 Thoracic aortic ectasia: Secondary | ICD-10-CM

## 2016-03-11 DIAGNOSIS — I714 Abdominal aortic aneurysm, without rupture: Secondary | ICD-10-CM

## 2016-03-11 LAB — CBC
HCT: 36.2 % (ref 36.0–46.0)
HEMOGLOBIN: 11.8 g/dL — AB (ref 12.0–15.0)
MCH: 28.2 pg (ref 26.0–34.0)
MCHC: 32.6 g/dL (ref 30.0–36.0)
MCV: 86.6 fL (ref 78.0–100.0)
Platelets: 204 10*3/uL (ref 150–400)
RBC: 4.18 MIL/uL (ref 3.87–5.11)
RDW: 13.9 % (ref 11.5–15.5)
WBC: 14.8 10*3/uL — AB (ref 4.0–10.5)

## 2016-03-11 LAB — COMPREHENSIVE METABOLIC PANEL
ALBUMIN: 2.8 g/dL — AB (ref 3.5–5.0)
ALK PHOS: 103 U/L (ref 38–126)
ALT: 92 U/L — ABNORMAL HIGH (ref 14–54)
ANION GAP: 5 (ref 5–15)
AST: 37 U/L (ref 15–41)
BILIRUBIN TOTAL: 2.1 mg/dL — AB (ref 0.3–1.2)
BUN: 8 mg/dL (ref 6–20)
CALCIUM: 8.3 mg/dL — AB (ref 8.9–10.3)
CO2: 30 mmol/L (ref 22–32)
Chloride: 100 mmol/L — ABNORMAL LOW (ref 101–111)
Creatinine, Ser: 0.67 mg/dL (ref 0.44–1.00)
GFR calc Af Amer: >60 mL/min (ref >60)
GLUCOSE: 111 mg/dL — AB (ref 65–99)
Potassium: 3.9 mmol/L (ref 3.5–5.1)
Sodium: 135 mmol/L (ref 135–145)
TOTAL PROTEIN: 5.6 g/dL — AB (ref 6.5–8.1)

## 2016-03-11 LAB — GLUCOSE, CAPILLARY: GLUCOSE-CAPILLARY: 95 mg/dL (ref 65–99)

## 2016-03-11 LAB — TROPONIN I: Troponin I: 0.03 ng/mL (ref <0.031)

## 2016-03-11 MED ORDER — LEVALBUTEROL HCL 0.63 MG/3ML IN NEBU
0.6300 mg | INHALATION_SOLUTION | Freq: Four times a day (QID) | RESPIRATORY_TRACT | Status: DC
Start: 2016-03-11 — End: 2016-03-12
  Administered 2016-03-11 – 2016-03-12 (×4): 0.63 mg via RESPIRATORY_TRACT
  Filled 2016-03-11 (×6): qty 3

## 2016-03-11 MED ORDER — PIPERACILLIN-TAZOBACTAM 3.375 G IVPB 30 MIN
3.3750 g | Freq: Four times a day (QID) | INTRAVENOUS | Status: DC
  Administered 2016-03-11 – 2016-03-13 (×6): 3.375 g via INTRAVENOUS
  Filled 2016-03-11 (×11): qty 50

## 2016-03-11 NOTE — Progress Notes (Signed)
2240. Patient has one IV site. Cardizem incompatible with Zosyn. Raliegh Ip Schorr paged regarding changing Zosyn run time to run over thirty minutes and pausing Cardizem while Zosyn is running. K. Schorr approved changing run time on the Zosyn to thirty minutes and to pausing the Cardizem during Zosyn administration. Will continue to monitor.

## 2016-03-11 NOTE — Progress Notes (Signed)
PT Cancellation Note  Patient Details Name: CRIMSON DUBBERLY MRN: [id number] DOB: [birth year]   Cancelled Treatment:     Chart  reviewed with events last night and pt on cardizem drip at moment, and also noted OT tried session and pt requested for therapy to check on her tomorrow.    Clide Dales 03/11/2016, 4:59 PM  Clide Dales, PT Pager: [phone] 03/11/2016

## 2016-03-11 NOTE — Progress Notes (Signed)
PROGRESS NOTE  Laurie [name] EFE:[id number] DOB: 1935/11/30 DOA: 03/08/2016 PCP: Beatrice Lecher, MD Outpatient Specialists:    LOS: 2 days    Subjective: Feels much better today, denies any new complaints. Still has some pain in RUQ with tenderness.  Brief Narrative: Laurie [name] is a 81 y.o. female with PMH of hypertension, hyperlipidemia, COPD, anxiety, atrial fibrillation not on anticoagulants possibly due to old age and high risk of fall, aortic valve replacement, lung cancer with lobectomy 2009, AAA, ascending aortic dilation, S/P cholecystectomy, who presents with abdominal pain.  Assessment & Plan: Principal Problem:   Abdominal pain Active Problems:   GAD (generalized anxiety disorder)   Essential hypertension, benign   S/P aortic valve replacement   S/P lobectomy of lung   Abdominal aortic aneurysm (HCC)   Ascending aorta dilatation (HCC)   Thoracic aortic aneurysm without rupture (HCC)   HTN (hypertension)   Sepsis (HCC)   COPD (chronic obstructive pulmonary disease) (HCC)   RUQ abdominal pain   Elevated LFTs   Biliary calculus   Acute respiratory failure with hypoxia (HCC)   Atrial fibrillation with RVR (HCC)   Cholangitis   Abdominal pain and sepsis with ascending cholangitis - CT-abd/pelvis showed periportal edema, diffuse pneumobilia, dlatation of the common bile duct to 1.9 cm in diameter, indicating common duct obstruction in the biliary infection - Of note, she was hospitalized in February 2017 with similar problem requiring ERCP at that time as well and had gram-negative bacteremia on cultures for [facility], was susceptible to Augmentin, Cipro and Zosyn. - Gastroenterology consulted, patient underwent an ERCP on 4/10 with removal of debris/pus but without any obvious stones - Abdominal pain improved today, LFTs improving - Continue Zosyn, blood cultures still NGTD. Diet advanced to regular, tolerating very well.  Atrial  fibrillation with rapid ventricular response Developed A. fib with RVR at heart rate reached 153. Cardiology consulted. Cardiology recommended to control the pain, fluids status and restart her home dose of metoprolol.  Acute hypoxic respiratory failure with chest pain - Patient found to be having difficulties breathing on 03/10/2016, new oxygen requirement, lung exam shows perfuse wheezing, she appears fluid overloaded. Discontinue IV fluids, IV Lasix. - This is likely secondary to the mild interstitial edema seen on the CXR. -Continue IV diuresis.  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5 ml   Filed Weights   03/10/16 1510 03/10/16 1700 03/11/16 0551    Weight: 78.8 kg (173 lb 11.6 oz) 78.1 kg (172 lb 2.9 oz) 77.1 kg (169 lb 15.6 oz)    Examination: Constitutional: Anxious, tachypneic Filed Vitals:   03/11/16 0600 03/11/16 0800 03/11/16 0829 03/11/16 0833  BP: 139/54     Pulse: 71     Temp:  98.6 F (37 C)    TempSrc:  Oral    Resp: 17     Height:      Weight:      SpO2: 97%  98% 98%   Eyes: PERRL ENMT: Mucous membranes are moist.  Respiratory: Diffuse bilateral wheezing, bibasilar crackles. Increased respiratory effort. + accessory muscle use.  Cardiovascular: Regular rate and rhythm, no murmurs / rubs / gallops. No extremity edema. 2+ pedal pulses. No carotid bruits.  Abdomen: no tenderness, Musculoskeletal: no clubbing / cyanosis.  Neurologic: Nonfocal Psychiatric: Anxious   Data Reviewed: I have personally reviewed following labs and imaging studies  CBC:  Recent Labs Lab 03/08/16 2003 03/09/16 0510 03/10/16 0533 03/11/16 0055  WBC 15.8* 27.1* 16.3* 14.8*  HGB 14.6 12.1 11.1* 11.8*  HCT 42.9 36.6 33.5* 36.2  MCV 84.8 86.5 87.0 86.6  PLT 249 207 168 242   Basic Metabolic Panel:  Recent Labs Lab 03/08/16 2003 03/09/16 0213 03/09/16 0510 03/10/16 0533 03/11/16 0055  NA 132* 130* 132* 135 135  K 3.8 4.0 4.1 4.2 3.9  CL 95* 98* 102 105 100*  CO2 '26 25 23 25 30  '$ GLUCOSE 140* 117* 126* 122* 111*  BUN '11 11 11 11 8  '$ CREATININE 0.71 0.81 0.62 0.64 0.67  CALCIUM 9.2 8.4* 8.1* 8.2* 8.3*   GFR: Estimated Creatinine Clearance: 62 mL/min (by C-G formula based on Cr of 0.67). Liver Function Tests:  Recent Labs Lab 03/08/16 2003 03/09/16 0213 03/09/16 0510 03/10/16 0533 03/11/16 0055  AST 323* 229* 176* 63* 37  ALT 234* 243* 209* 119* 92*  ALKPHOS 123 118 106 94 103  BILITOT 1.8* 2.9* 3.0* 2.6* 2.1*  PROT 7.0 6.0* 5.4* 5.1* 5.6*  ALBUMIN 3.9 3.2* 2.9* 2.6* 2.8*    Recent Labs Lab 03/08/16 2003 03/09/16 0505  LIPASE 21 22   No results for input(s): AMMONIA in the last 168 hours. Coagulation  Profile:  Recent Labs Lab 03/09/16 0213  INR 1.18   Cardiac Enzymes:  Recent Labs Lab 03/08/16 2159 03/10/16 0859 03/10/16 1424 03/10/16 1823 03/11/16 0055  TROPONINI <0.03 0.04* 0.04* 0.04* 0.03   BNP (last 3 results) No results for input(s): PROBNP in the last 8760 hours. HbA1C: No results for input(s): HGBA1C in the last 72 hours. CBG:  Recent Labs Lab 03/09/16 0728 03/10/16 0812 03/11/16 0903  GLUCAP 108* 123* 95   Lipid Profile: No results for input(s): CHOL, HDL, LDLCALC, TRIG, CHOLHDL, LDLDIRECT in the last 72 hours. Thyroid Function Tests: No results for input(s): TSH, T4TOTAL, FREET4, T3FREE, THYROIDAB in the last 72 hours. Anemia Panel: No results for input(s): VITAMINB12, FOLATE, FERRITIN, TIBC, IRON, RETICCTPCT in the last 72 hours. Urine analysis:    Component Value Date/Time   COLORURINE YELLOW 03/08/2016 [place]  03/08/2016 2201   LABSPEC 1.027 03/08/2016 2201   PHURINE 7.0 03/08/2016 2201   GLUCOSEU NEGATIVE 03/08/2016 2201   HGBUR NEGATIVE 03/08/2016 2201   HGBUR trace-intact 06/06/2008 [place] 03/08/2016 2201   BILIRUBINUR neg 03/18/2011 [place] 03/08/2016 2201   PROTEINUR NEGATIVE 03/08/2016 2201   PROTEINUR trace 03/18/2011 1530   UROBILINOGEN 0.2 07/13/2011 1832   UROBILINOGEN 0.2 03/18/2011 1530   NITRITE NEGATIVE 03/08/2016 2201   NITRITE neg 03/18/2011 1530   LEUKOCYTESUR NEGATIVE 03/08/2016 2201   Sepsis Labs: Invalid input(s): PROCALCITONIN, LACTICIDVEN  Recent Results (from the past 240 hour(s))  Culture, blood (x 2)     Status: None (Preliminary result)   Collection Time: 03/09/16  2:20 AM  Result Value Ref Range Status   Specimen Description BLOOD LEFT ARM  Final   Special Requests BOTTLES DRAWN AEROBIC AND ANAEROBIC 5CC  Final   Culture   Final    NO GROWTH 1 DAY Performed at [facility]    Report Status PENDING  Incomplete  Culture, blood (x 2)      Status: None (Preliminary result)   Collection Time: 03/09/16  2:25 AM  Result Value Ref Range Status   Specimen Description BLOOD BLOOD LEFT HAND  Final   Special Requests BOTTLES DRAWN AEROBIC AND ANAEROBIC 5CC  Final   Culture   Final    NO GROWTH 1 DAY Performed at [facility]    Report Status PENDING  Incomplete  MRSA PCR Screening     Status: None   Collection Time: 03/10/16  4:45 PM  Result Value Ref Range Status   MRSA by PCR NEGATIVE NEGATIVE Final    Comment:        The GeneXpert MRSA Assay (FDA approved for NASAL specimens only), is one component of a comprehensive MRSA colonization surveillance program. It is not intended to diagnose MRSA infection nor to guide or monitor treatment for MRSA infections.       Radiology Studies: Dg Chest Port 1 View  03/10/2016  CLINICAL DATA:  Shortness of breath and wheezing, history of COPD, former smoker, coronary artery disease, valvular heart disease EXAM: PORTABLE CHEST 1 VIEW COMPARISON:  CT scan of the chest of March 08, 2016 FINDINGS: The lungs remain hyperinflated. The interstitial markings are diffusely increased and more conspicuous than on the earlier CT scan. Areas of patchy alveolar opacity are noted in the right upper and lower lobes. There is pleural thickening or pleural fluid along the right lateral thoracic wall. The left lung exhibits no alveolar infiltrate but the interstitial markings are increased diffusely. The cardiac silhouette is top-normal in size. There is mild central pulmonary vascular prominence. The patient has undergone previous median sternotomy. IMPRESSION: COPD with superimposed patchy interstitial and alveolar pneumonia in the right lung with pleural fluid lying along the convexity of the right lung. Chronic underlying interstitial disease with increased conspicuity due to mild pulmonary interstitial edema. Electronically Signed   By: David  Martinique M.D.   On: 03/10/2016 07:52   Dg  Ercp  03/09/2016  CLINICAL DATA:  Diffuse biliary dilatation the common cholangitis EXAM: ERCP with sphincterotomy and balloon sweep TECHNIQUE: Multiple spot images obtained with the fluoroscopic device and submitted for interpretation post-procedure. FLUOROSCOPY TIME:  Number of Acquired Images:  10 COMPARISON:  03/08/2016 FINDINGS: Limited imaging during the ERCP procedure. There is diffuse severe dilatation of the common bile duct. Intrahepatic biliary dilatation noted. There is also pneumobilia. Balloon sweep performed  of the dilated duct system. Access removed. No stent visualized or inserted. IMPRESSION: Limited imaging during the ERCP with balloon sweep and sphincterotomy. Diffuse biliary dilatation noted. Please refer to the ERCP report for further details. These images were submitted for radiologic interpretation only. Please see the procedural report for the amount of contrast and the fluoroscopy time utilized. Electronically Signed   By: Jerilynn Mages.  Shick M.D.   On: 03/09/2016 15:57    Scheduled Meds: . furosemide  20 mg Intravenous BID  . levalbuterol  0.63 mg Nebulization Q6H  . loratadine  10 mg Oral Daily  . metoprolol  50 mg Oral BID  . piperacillin-tazobactam (ZOSYN)  IV  3.375 g Intravenous Q8H  . psyllium  1 packet Oral Daily  . sodium chloride flush  3 mL Intravenous Q12H  . ursodiol  300 mg Oral BID   Continuous Infusions: . diltiazem (CARDIZEM) infusion 10 mg/hr (03/11/16 0538)     Verlee Monte A, MD [facility] Pager [phone]  If 7PM-7AM, please contact night-coverage [email or web address] Password [facility] 03/11/2016, 12:51 PM

## 2016-03-11 NOTE — Progress Notes (Signed)
OT Cancellation Note  Patient Details Name: Laurie [name] MRN: [id number] DOB: Jul 28, 1935   Cancelled Treatment:    Reason Eval/Treat Not Completed: Other (comment).  Pt prefers therapy check back tomorrow.  Last night when she got up to [facility], she said she had a terrible time catching her breath.  Prefers to stay in bed today.  [address] OTR/L [phone] 03/11/2016 03/11/2016, 1:47 PM

## 2016-03-11 NOTE — Progress Notes (Signed)
Patient Name: Laurie [name] Date of Encounter: 03/11/2016     Principal Problem:   Abdominal pain Active Problems:   GAD (generalized anxiety disorder)   Essential hypertension, benign   S/P aortic valve replacement   S/P lobectomy of lung   Abdominal aortic aneurysm (HCC)   Ascending aorta dilatation (HCC)   Thoracic aortic aneurysm without rupture (HCC)   HTN (hypertension)   Sepsis (HCC)   COPD (chronic obstructive pulmonary disease) (HCC)   RUQ abdominal pain   Elevated LFTs   Biliary calculus   Acute respiratory failure with hypoxia (HCC)   Atrial fibrillation with RVR (HCC)    SUBJECTIVE  Still SOB. No more chest pain. Mild belly pain.   CURRENT MEDS . antiseptic oral rinse  7 mL Mouth Rinse BID  . furosemide  20 mg Intravenous BID  . levalbuterol  0.63 mg Nebulization Q6H  . loratadine  10 mg Oral Daily  . metoprolol  50 mg Oral BID  . piperacillin-tazobactam (ZOSYN)  IV  3.375 g Intravenous Q8H  . psyllium  1 packet Oral Daily  . sodium chloride flush  3 mL Intravenous Q12H  . ursodiol  300 mg Oral BID    OBJECTIVE  Filed Vitals:   03/11/16 0551 03/11/16 0600 03/11/16 0829 03/11/16 0833  BP:  139/54    Pulse:  71    Temp:      TempSrc:      Resp:  17    Height:      Weight: 169 lb 15.6 oz (77.1 kg)     SpO2:  97% 98% 98%    Intake/Output Summary (Last 24 hours) at 03/11/16 0920 Last data filed at 03/11/16 7628  Gross per 24 hour  Intake  197.5 ml  Output   4050 ml  Net -3852.5 ml   Filed Weights   03/10/16 1510 03/10/16 1700 03/11/16 0551  Weight: 173 lb 11.6 oz (78.8 kg) 172 lb 2.9 oz (78.1 kg) 169 lb 15.6 oz (77.1 kg)    PHYSICAL EXAM  General: Well developed, well nourished, elderly female in mild respiratory distress Head: Eyes PERRLA, No xanthomas. Normocephalic and atraumatic, oropharynx without edema or exudate. Dentition: poor Lungs: insp/exp wheeze, basilar rales Heart: Heart irregular rate and rhythm with S1, S2; soft  murmur. pulses are 2+ all 4 extrem.  Neck: No carotid bruits. No lymphadenopathy. JVD 9 cm. Abdomen: Bowel sounds present, abdomen soft and non-tender without masses or hernias noted. Msk: No spine or cva tenderness. No weakness, no joint deformities or effusions. Extremities: No clubbing or cyanosis. No edema.  Neuro: Alert and oriented X 3. No focal deficits noted. Psych: Good affect, responds appropriately Skin: No rashes or lesions noted.  Accessory Clinical Findings  CBC  Recent Labs  03/10/16 0533 03/11/16 0055  WBC 16.3* 14.8*  HGB 11.1* 11.8*  HCT 33.5* 36.2  MCV 87.0 86.6  PLT 168 315   Basic Metabolic Panel  Recent Labs  03/10/16 0533 03/11/16 0055  NA 135 135  K 4.2 3.9  CL 105 100*  CO2 25 30  GLUCOSE 122* 111*  BUN 11 8  CREATININE 0.64 0.67  CALCIUM 8.2* 8.3*   Liver Function Tests  Recent Labs  03/10/16 0533 03/11/16 0055  AST 63* 37  ALT 119* 92*  ALKPHOS 94 103  BILITOT 2.6* 2.1*  PROT 5.1* 5.6*  ALBUMIN 2.6* 2.8*    Recent Labs  03/08/16 2003 03/09/16 0505  LIPASE 21 22   Cardiac Enzymes  Recent Labs  03/10/16 1424 03/10/16 1823 03/11/16 0055  TROPONINI 0.04* 0.04* 0.03    TELE  NSR with PACs and PVCS. Short bursts of afib   Radiology/Studies  Ct Chest W Contrast  03/08/2016  CLINICAL DATA:  Mild dyspnea with upper abdominal pain. EXAM: CT CHEST WITH CONTRAST TECHNIQUE: Multidetector CT imaging of the chest was performed during intravenous contrast administration. CONTRAST:  76m ISOVUE-300 IOPAMIDOL (ISOVUE-300) INJECTION 61% COMPARISON:  08/15/2015 FINDINGS: There is stable postsurgical irregularity on the right. There is a noncalcified 5 x 6 mm nodule in the left lower lobe posterior base. This has been present over the past several examinations dating back to at least 02/17/2010, when it measured 5.0 mm. No suspicious nodules are evident. There is prominent interlobular septal thickening throughout both lungs. This  is new from 08/15/2015 and hit likely represents interstitial fluid. The vasculature also appears moderately prominent. This may represent congestive heart failure. No frank alveolar edema. No effusions. There is mild unchanged pleural thickening of the right posterior hemi thorax. There is no pathologic adenopathy.  Airways are patent. Upper abdomen is notable for pneumobilia, unchanged. There is unchanged dilatation of the thoracic aorta, measuring 4.4 cm in the ascending aorta and 3.8 cm in the descending. No dissection or other acute vascular abnormality. IMPRESSION: 1. Interlobular septal thickening throughout both lungs which is new, likely due to interstitial fluid. This could represent CHF as the vasculature also appears moderately prominent. 2. Stable left lower lobe posterior base nodule. No suspicious nodules. 3. Unchanged dilatation of the thoracic aorta. Electronically Signed   By: DAndreas NewportM.D.   On: 03/08/2016 21:44   Ct Abdomen Pelvis W Contrast  03/08/2016  CLINICAL DATA:  Acute onset of mid abdominal pain. Initial encounter. EXAM: CT ABDOMEN AND PELVIS WITH CONTRAST TECHNIQUE: Multidetector CT imaging of the abdomen and pelvis was performed using the standard protocol following bolus administration of intravenous contrast. CONTRAST:  812mISOVUE-300 IOPAMIDOL (ISOVUE-300) INJECTION 61% COMPARISON:  CTA of the chest, abdomen and pelvis performed 08/15/2015 FINDINGS: Bibasilar scarring is noted, with trace right-sided pleural fluid. A 7 mm nodule is noted at the left lung base. Periportal edema is noted. Diffuse pneumobilia is seen. There is dilatation of the common bile duct to 1.9 cm in diameter. Would correlate for any evidence of postcholecystectomy syndrome. The liver is otherwise grossly unremarkable. The spleen is unremarkable in appearance. The patient is status post cholecystectomy. The pancreas and adrenal glands are grossly unremarkable. The kidneys are unremarkable in  appearance. There is no evidence of hydronephrosis. No renal or ureteral stones are seen. No perinephric stranding is appreciated. No free fluid is identified. The small bowel is unremarkable in appearance. The stomach is within normal limits. No acute vascular abnormalities are seen. There is mild aneurysmal dilatation of the infrarenal abdominal aorta to 3.4 cm in transverse dimension. Scattered calcification is noted along the abdominal aorta and its branches. The patient is status post appendectomy. Scattered diverticulosis is noted along the transverse, descending and sigmoid colon, without evidence of diverticulitis. The bladder is moderately distended and partially filled with contrast. The uterus is grossly unremarkable. Scattered prominent periuterine vessels could reflect pelvic congestion syndrome in the appropriate clinical situation. The ovaries are relatively symmetric. No inguinal lymphadenopathy is seen. No acute osseous abnormalities are identified. IMPRESSION: 1. Periportal edema noted. Diffuse pneumobilia seen. Dilatation of the common bile duct to 1.9 cm in diameter. Would correlate for any evidence of postcholecystectomy syndrome. 2. Mild aneurysmal dilatation of the  infrarenal abdominal aorta to 3.4 cm in transverse dimension. Scattered calcification along the abdominal aorta and its branches. Recommend followup by ultrasound in 3 years. This recommendation follows ACR consensus guidelines: White Paper of the ACR Incidental Findings Committee II on Vascular Findings. J Am Coll Radiol 2013; 31:517-616 3. Scattered prominent periuterine vessels could reflect pelvic congestion syndrome in the appropriate clinical situation. 4. Bibasilar scarring, with trace right-sided pleural fluid. 7 mm nodule at the left lung base. It is difficult to determine whether this has changed in size since the prior study. Non-contrast chest CT at 6-12 months is recommended. If the nodule is stable at time of repeat  CT, then future CT at 18-24 months (from today's scan) is considered optional for low-risk patients, but is recommended for high-risk patients. This recommendation follows the consensus statement: Guidelines for Management of Incidental Pulmonary Nodules Detected on CT Images:From the Fleischner Society 2017; published online before print (10.1148/radiol.0737106269). 5. Scattered diverticulosis along the transverse, descending and sigmoid colon, without evidence of diverticulitis. Electronically Signed   By: Garald Balding M.D.   On: 03/08/2016 23:46   Dg Chest Port 1 View  03/10/2016  CLINICAL DATA:  Shortness of breath and wheezing, history of COPD, former smoker, coronary artery disease, valvular heart disease EXAM: PORTABLE CHEST 1 VIEW COMPARISON:  CT scan of the chest of March 08, 2016 FINDINGS: The lungs remain hyperinflated. The interstitial markings are diffusely increased and more conspicuous than on the earlier CT scan. Areas of patchy alveolar opacity are noted in the right upper and lower lobes. There is pleural thickening or pleural fluid along the right lateral thoracic wall. The left lung exhibits no alveolar infiltrate but the interstitial markings are increased diffusely. The cardiac silhouette is top-normal in size. There is mild central pulmonary vascular prominence. The patient has undergone previous median sternotomy. IMPRESSION: COPD with superimposed patchy interstitial and alveolar pneumonia in the right lung with pleural fluid lying along the convexity of the right lung. Chronic underlying interstitial disease with increased conspicuity due to mild pulmonary interstitial edema. Electronically Signed   By: David  Martinique M.D.   On: 03/10/2016 07:52   Dg Ercp  03/09/2016  CLINICAL DATA:  Diffuse biliary dilatation the common cholangitis EXAM: ERCP with sphincterotomy and balloon sweep TECHNIQUE: Multiple spot images obtained with the fluoroscopic device and submitted for interpretation  post-procedure. FLUOROSCOPY TIME:  Number of Acquired Images:  10 COMPARISON:  03/08/2016 FINDINGS: Limited imaging during the ERCP procedure. There is diffuse severe dilatation of the common bile duct. Intrahepatic biliary dilatation noted. There is also pneumobilia. Balloon sweep performed of the dilated duct system. Access removed. No stent visualized or inserted. IMPRESSION: Limited imaging during the ERCP with balloon sweep and sphincterotomy. Diffuse biliary dilatation noted. Please refer to the ERCP report for further details. These images were submitted for radiologic interpretation only. Please see the procedural report for the amount of contrast and the fluoroscopy time utilized. Electronically Signed   By: Jerilynn Mages.  Shick M.D.   On: 03/09/2016 15:57    ASSESSMENT AND PLAN KIRSTEN SPEARING is a 81 y.o. year old female with a history of hypertension, hyperlipidemia, COPD, anxiety, atrial fibrillation not on anticoagulants possibly due to old age and high risk of fall, aortic valve replacement with a tissue valve, lung cancer with lobectomy 2009, AAA, ascending aortic dilation, cholecystectomy who was admitted to [facility] on 03/09/16 with abdominal pain. Seen by GI and ERCP for CBD stones was performed. Cholangitis, CBD sludge and pneumobilia were  seen. She was diagnosed with sepsis 2nd ascending cholangitis. She went into afib with RVR with chest pain and cardiology was consulted.   Acute on chronic CHF: previously normal LV function on ECHO in 2015. Will update ECHO.  -- Felt to be volume overloaded 2/2 IVFs. CXR with CHF. Started on IV lasix '20mg'$  BID. Neg 3L overnight, but still net positive 2L. Still SOB but feeling better. Continue diuresis  PAF: she is in and out of aib. Currently NSR with PACs and PVCs and short runs of afib. She remains on dilt gtt at 10 mg/hr and Lopressor '50mg'$  BID. HR in 80s. Dilt was stopped late last night due to 2.4 sec pauses. She then went back into afib w/ RVR and it was  restarted. -- CHADSVASC score of at least 6 (CHF, HTN, age, vasc dz, Fsex ). Dr Percival Spanish discussed with primary team and anticoagulation is currently not an option  Elevated troponin: low and flat c/w demand ischemia in the setting of sepsis and afib with RVR. Last trop normal. No more chest pain.   HLD: statin intolerant   GI: Repeat ERCP done 4/10 per Dr. Fuller Plan, debris / sludge / puss removed with balloon sweep although no clear obstructing stone. Since the procedure her WBC and bilirubin / ALT are downtrending. GI signing off.     Judy Pimple PA-C  Pager [phone]  History and all data above reviewed.  Patient examined.  I agree with the findings as above.  Much better today.  Breathing better.  Mild abdominal pain.  Sinus  currently The patient exam reveals COR:RRR  ,  Lungs: Decreased breath sounds improved  ,  Abd: Positive bowel sounds, no rebound no guarding, Ext No edema   .  All available labs, radiology testing, previous records reviewed. Agree with documented assessment and plan. CHF:  Echo pending.  Continue IV diuresis today.  Atrial fib:  Continue IV Cardizem overnight.    Jeneen Rinks Jinnie Onley  12:25 PM  03/11/2016

## 2016-03-11 NOTE — Progress Notes (Signed)
[facility] Progress Note  Subjective:  A little upper abdominal soreness but much improved.  Just finished eating breakfast.  Transferred to [facility] for Afib with RVR yesterday and is on cardizem gtt.  Objective:  Vital signs in last 24 hours: Temp:  [97.2 F (36.2 C)-98.3 F (36.8 C)] 97.8 F (36.6 C) (04/12 0400) Pulse Rate:  [25-165] 71 (04/12 0600) Resp:  [17-40] 17 (04/12 0600) BP: (94-149)/(39-101) 139/54 mmHg (04/12 0600) SpO2:  [93 %-99 %] 98 % (04/12 0833) Weight:  [169 lb 15.6 oz (77.1 kg)-173 lb 11.6 oz (78.8 kg)] 169 lb 15.6 oz (77.1 kg) (04/12 0551) Last BM Date: 03/08/16 General:  Alert, Well-developed, in NAD Heart:  Irregularly irregular. Pulm:  CTAB.  No W/R/R. Abdomen:  Soft, non-distended.  BS present.  Non-tender. Extremities:  Without edema. Neurologic:  Alert and oriented x 4;  grossly normal neurologically. Psych:  Alert and cooperative. Normal mood and affect.  Intake/Output from previous day: 04/11 0701 - 04/12 0700 In: 197.5 [I.V.:97.5; IV Piggyback:100] Out: 3350 [Urine:3350] Intake/Output this shift: Total I/O In: -  Out: 100 [Urine:100]  Lab Results:  Recent Labs  03/09/16 0510 03/10/16 0533 03/11/16 0055  WBC 27.1* 16.3* 14.8*  HGB 12.1 11.1* 11.8*  HCT 36.6 33.5* 36.2  PLT 207 168 204   BMET  Recent Labs  03/09/16 0510 03/10/16 0533 03/11/16 0055  NA 132* 135 135  K 4.1 4.2 3.9  CL 102 105 100*  CO2 '23 25 30  '$ GLUCOSE 126* 122* 111*  BUN '11 11 8  '$ CREATININE 0.62 0.64 0.67  CALCIUM 8.1* 8.2* 8.3*   LFT  Recent Labs  03/11/16 0055  PROT 5.6*  ALBUMIN 2.8*  AST 37  ALT 92*  ALKPHOS 103  BILITOT 2.1*   PT/INR  Recent Labs  03/09/16 0213  LABPROT 14.7  INR 1.18   Dg Chest Port 1 View  03/10/2016  CLINICAL DATA:  Shortness of breath and wheezing, history of COPD, former smoker, coronary artery disease, valvular heart disease EXAM: PORTABLE CHEST 1 VIEW COMPARISON:  CT scan of the chest of March 08, 2016 FINDINGS: The lungs remain hyperinflated. The interstitial markings are diffusely increased and more conspicuous than on the earlier CT scan. Areas of patchy alveolar opacity are noted in the right upper and lower lobes. There is pleural thickening or pleural fluid along the right lateral thoracic wall. The left lung exhibits no alveolar infiltrate but the interstitial markings are increased diffusely. The cardiac silhouette is top-normal in size. There is mild central pulmonary vascular prominence. The patient has undergone previous median sternotomy. IMPRESSION: COPD with superimposed patchy interstitial and alveolar pneumonia in the right lung with pleural fluid lying along the convexity of the right lung. Chronic underlying interstitial disease with increased conspicuity due to mild pulmonary interstitial edema. Electronically Signed   By: David  Martinique M.D.   On: 03/10/2016 07:52   Dg Ercp  03/09/2016  CLINICAL DATA:  Diffuse biliary dilatation the common cholangitis EXAM: ERCP with sphincterotomy and balloon sweep TECHNIQUE: Multiple spot images obtained with the fluoroscopic device and submitted for interpretation post-procedure. FLUOROSCOPY TIME:  Number of Acquired Images:  10 COMPARISON:  03/08/2016 FINDINGS: Limited imaging during the ERCP procedure. There is diffuse severe dilatation of the common bile duct. Intrahepatic biliary dilatation noted. There is also pneumobilia. Balloon sweep performed of the dilated duct system. Access removed. No stent visualized or inserted. IMPRESSION: Limited imaging during the ERCP with balloon sweep and sphincterotomy. Diffuse  biliary dilatation noted. Please refer to the ERCP report for further details. These images were submitted for radiologic interpretation only. Please see the procedural report for the amount of contrast and the fluoroscopy time utilized. Electronically Signed   By: Jerilynn Mages.  Shick M.D.   On: 03/09/2016 15:57    Assessment / Plan: *81 y/o  female with history of choledocholithiasis s/p ERCP a 2 months ago, who presented with cholangitis and elevation in liver enzymes. Repeat ERCP done 4/10 per Dr. Fuller Plan, debris / sludge / puss removed with balloon sweep although no clear obstructing stone. Since the procedure her WBC and bilirubin / ALT are downtrending. In discussion with her son, unclear how compliant the patient is with the Ursodiol as an outpatient. The patient reports taking it daily, although it appears she is underdosed. At her weight, the dose of 8-'10mg'$ /kg is recommended for gallstone dissolution, and should be roughly 576 to '720mg'$  per day divided into BID dosing. In this light, recommend Ursodiol at '300mg'$  BID moving forward to hopefully prevent further development of sludge / debris. I would otherwise continue antibiotics for now, trend LFTs and CBC.  Would transition to PO cipro and flagyl at discharge and complete a 14 day course of antibiotics. *Afib with RVR:  Transferred to [facility] 4/11.  On cardizem gtt.  **Will sign off from a GI standpoint.    LOS: 2 days   Jhoana Upham D.  03/11/2016, 8:54 AM  Pager number [phone]

## 2016-03-11 NOTE — Progress Notes (Signed)
Pt converted back to A-fib from NSR at 0100.HR increasing between 130s-180s. Cardizem drip restarted at 7.'5mg'$ /hr with no change in HR and drip was increased to '10mg'$ /hr at 0147. HR within goal range of 65-105, BP remains stable. Pt resting in bed at this time.

## 2016-03-12 ENCOUNTER — Inpatient Hospital Stay (HOSPITAL_COMMUNITY): Payer: Commercial Managed Care - HMO

## 2016-03-12 DIAGNOSIS — R0600 Dyspnea, unspecified: Secondary | ICD-10-CM | POA: Diagnosis present

## 2016-03-12 DIAGNOSIS — Z954 Presence of other heart-valve replacement: Secondary | ICD-10-CM

## 2016-03-12 DIAGNOSIS — F411 Generalized anxiety disorder: Secondary | ICD-10-CM

## 2016-03-12 DIAGNOSIS — I4891 Unspecified atrial fibrillation: Secondary | ICD-10-CM

## 2016-03-12 DIAGNOSIS — J449 Chronic obstructive pulmonary disease, unspecified: Secondary | ICD-10-CM | POA: Diagnosis present

## 2016-03-12 LAB — GLUCOSE, CAPILLARY: Glucose-Capillary: 104 mg/dL — ABNORMAL HIGH (ref 65–99)

## 2016-03-12 LAB — ECHOCARDIOGRAM COMPLETE
Height: 67 in
Weight: 2638.47 oz

## 2016-03-12 LAB — BASIC METABOLIC PANEL
Anion gap: 9 (ref 5–15)
BUN: 7 mg/dL (ref 6–20)
CO2: 33 mmol/L — AB (ref 22–32)
Calcium: 8.5 mg/dL — ABNORMAL LOW (ref 8.9–10.3)
Chloride: 95 mmol/L — ABNORMAL LOW (ref 101–111)
Creatinine, Ser: 0.68 mg/dL (ref 0.44–1.00)
GLUCOSE: 109 mg/dL — AB (ref 65–99)
POTASSIUM: 3.1 mmol/L — AB (ref 3.5–5.1)
SODIUM: 137 mmol/L (ref 135–145)

## 2016-03-12 MED ORDER — METOPROLOL TARTRATE 50 MG PO TABS
75.0000 mg | ORAL_TABLET | Freq: Two times a day (BID) | ORAL | Status: DC
  Administered 2016-03-12 – 2016-03-14 (×4): 75 mg via ORAL
  Filled 2016-03-12 (×2): qty 1
  Filled 2016-03-12 (×2): qty 3

## 2016-03-12 MED ORDER — POTASSIUM CHLORIDE CRYS ER 20 MEQ PO TBCR
40.0000 meq | EXTENDED_RELEASE_TABLET | Freq: Four times a day (QID) | ORAL | Status: AC
  Administered 2016-03-12 (×2): 40 meq via ORAL
  Filled 2016-03-12 (×2): qty 2

## 2016-03-12 MED ORDER — LEVALBUTEROL HCL 0.63 MG/3ML IN NEBU
0.6300 mg | INHALATION_SOLUTION | Freq: Three times a day (TID) | RESPIRATORY_TRACT | Status: DC
  Administered 2016-03-13: 0.63 mg via RESPIRATORY_TRACT
  Filled 2016-03-12: qty 3

## 2016-03-12 MED ORDER — FUROSEMIDE 10 MG/ML IJ SOLN
20.0000 mg | Freq: Once | INTRAMUSCULAR | Status: AC
  Administered 2016-03-12: 20 mg via INTRAVENOUS

## 2016-03-12 MED ORDER — DILTIAZEM HCL ER COATED BEADS 120 MG PO CP24
240.0000 mg | ORAL_CAPSULE | Freq: Every day | ORAL | Status: DC
  Administered 2016-03-12: 240 mg via ORAL
  Filled 2016-03-12: qty 2

## 2016-03-12 MED ORDER — FUROSEMIDE 10 MG/ML IJ SOLN
INTRAMUSCULAR | Status: AC
  Filled 2016-03-12: qty 2

## 2016-03-12 MED ORDER — AMIODARONE HCL IN DEXTROSE 360-4.14 MG/200ML-% IV SOLN
60.0000 mg/h | INTRAVENOUS | Status: DC
  Administered 2016-03-12 (×2): 60 mg/h via INTRAVENOUS
  Filled 2016-03-12 (×2): qty 200

## 2016-03-12 MED ORDER — DILTIAZEM LOAD VIA INFUSION
10.0000 mg | Freq: Once | INTRAVENOUS | Status: AC
  Administered 2016-03-12: 10 mg via INTRAVENOUS
  Filled 2016-03-12: qty 10

## 2016-03-12 MED ORDER — AMIODARONE LOAD VIA INFUSION
150.0000 mg | Freq: Once | INTRAVENOUS | Status: AC
  Administered 2016-03-12: 150 mg via INTRAVENOUS
  Filled 2016-03-12: qty 83.34

## 2016-03-12 MED ORDER — DEXTROSE 5 % IV SOLN
5.0000 mg/h | INTRAVENOUS | Status: DC
  Administered 2016-03-12: 5 mg/h via INTRAVENOUS
  Administered 2016-03-13: 2.5 mg/h via INTRAVENOUS
  Filled 2016-03-12 (×2): qty 100

## 2016-03-12 MED ORDER — AMIODARONE HCL IN DEXTROSE 360-4.14 MG/200ML-% IV SOLN
30.0000 mg/h | INTRAVENOUS | Status: DC
  Administered 2016-03-13: 30 mg/h via INTRAVENOUS
  Filled 2016-03-12 (×2): qty 200

## 2016-03-12 MED ORDER — MAGNESIUM SULFATE 2 GM/50ML IV SOLN
2.0000 g | Freq: Once | INTRAVENOUS | Status: AC
  Administered 2016-03-12: 2 g via INTRAVENOUS
  Filled 2016-03-12: qty 50

## 2016-03-12 NOTE — Progress Notes (Signed)
    Went back into afib with RVR of cardizem gtt. Will start amio bolus and infusion.    Angelena Form PA-C  [facility]

## 2016-03-12 NOTE — Progress Notes (Signed)
PT Cancellation Note  Patient Details Name: Laurie [name] MRN: [id number] DOB: [birth year]   Cancelled Treatment:    Reason Eval/Treat Not Completed: Medical issues which prohibited therapy (HR 160's. ) Tresa Endo PT [phone]   Claretha Cooper 03/12/2016, 2:26 PM

## 2016-03-12 NOTE — Progress Notes (Signed)
Date:  March 12, 2016 Chart reviewed for concurrent status and case management needs. Will continue to follow patient for changes and needs: iv cardizem drip Velva Harman, BSN, [place], [place]   [phone]

## 2016-03-12 NOTE — Progress Notes (Signed)
Patient Name: Laurie [name] Date of Encounter: 03/12/2016     Principal Problem:   Abdominal pain Active Problems:   GAD (generalized anxiety disorder)   Essential hypertension, benign   S/P aortic valve replacement   S/P lobectomy of lung   Abdominal aortic aneurysm (HCC)   Ascending aorta dilatation (HCC)   Thoracic aortic aneurysm without rupture (HCC)   HTN (hypertension)   Sepsis (HCC)   COPD (chronic obstructive pulmonary disease) (HCC)   RUQ abdominal pain   Elevated LFTs   Biliary calculus   Acute respiratory failure with hypoxia (HCC)   Atrial fibrillation with RVR (HCC)   Cholangitis    SUBJECTIVE  Breathing back to baseline. Feeling well. No CP or SOB. Belly feeling better. SHe is wanting to go home. Says she cant get a ride if she doesn't go to home today. Kids are going to the beach. Discussed that she will need to stay at least today  CURRENT MEDS . furosemide  20 mg Intravenous BID  . levalbuterol  0.63 mg Nebulization TID  . loratadine  10 mg Oral Daily  . magnesium sulfate 1 - 4 g bolus IVPB  2 g Intravenous Once  . metoprolol  50 mg Oral BID  . piperacillin-tazobactam  3.375 g Intravenous 4 times per day  . potassium chloride  40 mEq Oral Q6H  . psyllium  1 packet Oral Daily  . sodium chloride flush  3 mL Intravenous Q12H  . ursodiol  300 mg Oral BID    OBJECTIVE  Filed Vitals:   03/12/16 0600 03/12/16 0730 03/12/16 0844 03/12/16 0847  BP: 139/65     Pulse: 72     Temp:  98.2 F (36.8 C)    TempSrc:  Oral    Resp: 17     Height:      Weight:      SpO2: 94%  96% 96%    Intake/Output Summary (Last 24 hours) at 03/12/16 0921 Last data filed at 03/12/16 0850  Gross per 24 hour  Intake    770 ml  Output   3500 ml  Net  -2730 ml   Filed Weights   03/10/16 1700 03/11/16 0551 03/12/16 0100  Weight: 172 lb 2.9 oz (78.1 kg) 169 lb 15.6 oz (77.1 kg) 164 lb 14.5 oz (74.8 kg)    PHYSICAL EXAM  General: Well developed, well  nourished, elderly female Head: Eyes PERRLA, No xanthomas. Normocephalic and atraumatic, oropharynx without edema or exudate. Dentition: poor Lungs: CTAB Heart: Heart irregular rate and rhythm with S1, S2; soft murmur. pulses are 2+ all 4 extrem.  Neck: No carotid bruits. No lymphadenopathy.no JVD  Abdomen: Bowel sounds present, abdomen soft and non-tender without masses or hernias noted. Msk: No spine or cva tenderness. No weakness, no joint deformities or effusions. Extremities: No clubbing or cyanosis. No edema.  Neuro: Alert and oriented X 3. No focal deficits noted. Psych: Good affect, responds appropriately Skin: No rashes or lesions noted.  Accessory Clinical Findings  CBC  Recent Labs  03/10/16 0533 03/11/16 0055  WBC 16.3* 14.8*  HGB 11.1* 11.8*  HCT 33.5* 36.2  MCV 87.0 86.6  PLT 168 102   Basic Metabolic Panel  Recent Labs  03/11/16 0055 03/12/16 0328  NA 135 137  K 3.9 3.1*  CL 100* 95*  CO2 30 33*  GLUCOSE 111* 109*  BUN 8 7  CREATININE 0.67 0.68  CALCIUM 8.3* 8.5*   Liver Function Tests  Recent Labs  03/10/16 0533 03/11/16 0055  AST 63* 37  ALT 119* 92*  ALKPHOS 94 103  BILITOT 2.6* 2.1*  PROT 5.1* 5.6*  ALBUMIN 2.6* 2.8*   No results for input(s): LIPASE, AMYLASE in the last 72 hours. Cardiac Enzymes  Recent Labs  03/10/16 1424 03/10/16 1823 03/11/16 0055  TROPONINI 0.04* 0.04* 0.03    TELE  NSR with PACs and PVCS. Short bursts of afib   Radiology/Studies  Ct Chest W Contrast  03/08/2016  CLINICAL DATA:  Mild dyspnea with upper abdominal pain. EXAM: CT CHEST WITH CONTRAST TECHNIQUE: Multidetector CT imaging of the chest was performed during intravenous contrast administration. CONTRAST:  37m ISOVUE-300 IOPAMIDOL (ISOVUE-300) INJECTION 61% COMPARISON:  08/15/2015 FINDINGS: There is stable postsurgical irregularity on the right. There is a noncalcified 5 x 6 mm nodule in the left lower lobe posterior base. This has been  present over the past several examinations dating back to at least 02/17/2010, when it measured 5.0 mm. No suspicious nodules are evident. There is prominent interlobular septal thickening throughout both lungs. This is new from 08/15/2015 and hit likely represents interstitial fluid. The vasculature also appears moderately prominent. This may represent congestive heart failure. No frank alveolar edema. No effusions. There is mild unchanged pleural thickening of the right posterior hemi thorax. There is no pathologic adenopathy.  Airways are patent. Upper abdomen is notable for pneumobilia, unchanged. There is unchanged dilatation of the thoracic aorta, measuring 4.4 cm in the ascending aorta and 3.8 cm in the descending. No dissection or other acute vascular abnormality. IMPRESSION: 1. Interlobular septal thickening throughout both lungs which is new, likely due to interstitial fluid. This could represent CHF as the vasculature also appears moderately prominent. 2. Stable left lower lobe posterior base nodule. No suspicious nodules. 3. Unchanged dilatation of the thoracic aorta. Electronically Signed   By: DAndreas NewportM.D.   On: 03/08/2016 21:44   Ct Abdomen Pelvis W Contrast  03/08/2016  CLINICAL DATA:  Acute onset of mid abdominal pain. Initial encounter. EXAM: CT ABDOMEN AND PELVIS WITH CONTRAST TECHNIQUE: Multidetector CT imaging of the abdomen and pelvis was performed using the standard protocol following bolus administration of intravenous contrast. CONTRAST:  811mISOVUE-300 IOPAMIDOL (ISOVUE-300) INJECTION 61% COMPARISON:  CTA of the chest, abdomen and pelvis performed 08/15/2015 FINDINGS: Bibasilar scarring is noted, with trace right-sided pleural fluid. A 7 mm nodule is noted at the left lung base. Periportal edema is noted. Diffuse pneumobilia is seen. There is dilatation of the common bile duct to 1.9 cm in diameter. Would correlate for any evidence of postcholecystectomy syndrome. The liver is  otherwise grossly unremarkable. The spleen is unremarkable in appearance. The patient is status post cholecystectomy. The pancreas and adrenal glands are grossly unremarkable. The kidneys are unremarkable in appearance. There is no evidence of hydronephrosis. No renal or ureteral stones are seen. No perinephric stranding is appreciated. No free fluid is identified. The small bowel is unremarkable in appearance. The stomach is within normal limits. No acute vascular abnormalities are seen. There is mild aneurysmal dilatation of the infrarenal abdominal aorta to 3.4 cm in transverse dimension. Scattered calcification is noted along the abdominal aorta and its branches. The patient is status post appendectomy. Scattered diverticulosis is noted along the transverse, descending and sigmoid colon, without evidence of diverticulitis. The bladder is moderately distended and partially filled with contrast. The uterus is grossly unremarkable. Scattered prominent periuterine vessels could reflect pelvic congestion syndrome in the appropriate clinical situation. The ovaries are relatively symmetric.  No inguinal lymphadenopathy is seen. No acute osseous abnormalities are identified. IMPRESSION: 1. Periportal edema noted. Diffuse pneumobilia seen. Dilatation of the common bile duct to 1.9 cm in diameter. Would correlate for any evidence of postcholecystectomy syndrome. 2. Mild aneurysmal dilatation of the infrarenal abdominal aorta to 3.4 cm in transverse dimension. Scattered calcification along the abdominal aorta and its branches. Recommend followup by ultrasound in 3 years. This recommendation follows ACR consensus guidelines: White Paper of the ACR Incidental Findings Committee II on Vascular Findings. J Am Coll Radiol 2013; 67:893-810 3. Scattered prominent periuterine vessels could reflect pelvic congestion syndrome in the appropriate clinical situation. 4. Bibasilar scarring, with trace right-sided pleural fluid. 7 mm  nodule at the left lung base. It is difficult to determine whether this has changed in size since the prior study. Non-contrast chest CT at 6-12 months is recommended. If the nodule is stable at time of repeat CT, then future CT at 18-24 months (from today's scan) is considered optional for low-risk patients, but is recommended for high-risk patients. This recommendation follows the consensus statement: Guidelines for Management of Incidental Pulmonary Nodules Detected on CT Images:From the Fleischner Society 2017; published online before print (10.1148/radiol.1751025852). 5. Scattered diverticulosis along the transverse, descending and sigmoid colon, without evidence of diverticulitis. Electronically Signed   By: Garald Balding M.D.   On: 03/08/2016 23:46   Dg Chest Port 1 View  03/10/2016  CLINICAL DATA:  Shortness of breath and wheezing, history of COPD, former smoker, coronary artery disease, valvular heart disease EXAM: PORTABLE CHEST 1 VIEW COMPARISON:  CT scan of the chest of March 08, 2016 FINDINGS: The lungs remain hyperinflated. The interstitial markings are diffusely increased and more conspicuous than on the earlier CT scan. Areas of patchy alveolar opacity are noted in the right upper and lower lobes. There is pleural thickening or pleural fluid along the right lateral thoracic wall. The left lung exhibits no alveolar infiltrate but the interstitial markings are increased diffusely. The cardiac silhouette is top-normal in size. There is mild central pulmonary vascular prominence. The patient has undergone previous median sternotomy. IMPRESSION: COPD with superimposed patchy interstitial and alveolar pneumonia in the right lung with pleural fluid lying along the convexity of the right lung. Chronic underlying interstitial disease with increased conspicuity due to mild pulmonary interstitial edema. Electronically Signed   By: David  Martinique M.D.   On: 03/10/2016 07:52   Dg Ercp  03/09/2016  CLINICAL  DATA:  Diffuse biliary dilatation the common cholangitis EXAM: ERCP with sphincterotomy and balloon sweep TECHNIQUE: Multiple spot images obtained with the fluoroscopic device and submitted for interpretation post-procedure. FLUOROSCOPY TIME:  Number of Acquired Images:  10 COMPARISON:  03/08/2016 FINDINGS: Limited imaging during the ERCP procedure. There is diffuse severe dilatation of the common bile duct. Intrahepatic biliary dilatation noted. There is also pneumobilia. Balloon sweep performed of the dilated duct system. Access removed. No stent visualized or inserted. IMPRESSION: Limited imaging during the ERCP with balloon sweep and sphincterotomy. Diffuse biliary dilatation noted. Please refer to the ERCP report for further details. These images were submitted for radiologic interpretation only. Please see the procedural report for the amount of contrast and the fluoroscopy time utilized. Electronically Signed   By: Jerilynn Mages.  Shick M.D.   On: 03/09/2016 15:57    ASSESSMENT AND PLAN Laurie [name] is a 81 y.o. year old female with a history of hypertension, hyperlipidemia, COPD, anxiety, PAF, aortic valve replacement with a tissue valve, lung cancer with lobectomy 2009, AAA,  s/p AVR, ascending aortic dilation, cholecystectomy who was admitted to [facility] on 03/09/16 with abdominal pain. Seen by GI and ERCP for CBD stones was performed. Cholangitis, CBD sludge and pneumobilia were seen. She was diagnosed with sepsis 2nd ascending cholangitis. She went into afib with RVR with chest pain and cardiology was consulted.   Acute on chronic CHF: previously normal LV function on ECHO in 2015. Repeat 2D ECHO in process. Per ECHO tech- LV function looks normal and previous valve replacement looks good -- Felt to be volume overloaded 2/2 IVFs. CXR with CHF. Started on IV lasix '20mg'$  BID. Brisk diuresis. Net neg 501 mL. Breathing back to baseline. Got AM dose IV lasix. I will stop this. I am not sure she will need any long  term maintenance lasix with good LV function.  PAF: she is in and out of aib. Currently NSR with PACs and PVCs and short runs of afib. She remains on dilt gtt at 10 mg/hr and Lopressor '50mg'$  BID. HR in 80s. I will stop dilt gtt and start Cardizem CD '240mg'$ .  -- CHADSVASC score of at least 6 (CHF, HTN, age, vasc dz, Fsex ). Dr Percival Spanish discussed with primary team and anticoagulation is currently not an option. Not sure if still not an anticoagulation candidate with GI issues resolving  Elevated troponin: low and flat c/w demand ischemia in the setting of sepsis and afib with RVR. Last trop normal. No more chest pain.   HLD: statin intolerant   GI: Repeat ERCP done 4/10 per Dr. Fuller Plan, debris / sludge / puss removed with balloon sweep although no clear obstructing stone. Since the procedure her WBC and bilirubin / ALT are downtrending. GI signing off.   Hypokalemia: 2/2 diuresis. This is being supplemented  AS S/p AVR (2009): repeat 2D ECHO pending today.  Carotid artery disease: follow up dopplers due in 10/2016  Dispo: likely home tomorrow or the next day if she continues to do well   Laurie Pimple PA-C  Pager [phone]   History and all data above reviewed.  Patient examined.  I agree with the findings as above.  Breathing better.  No new pain.  Abdominal pain is improved. The patient exam reveals PNT:[id number]  ,  Lungs: Decreased breath sounds with crackles left greater than right base  ,  Abd: Positive bowel sounds, no rebound no guarding, Ext Trace edema   .  All available labs, radiology testing, previous records reviewed. Agree with documented assessment and plan. Afib:  Now sinus and switched to PO Cardizem.  We need to know from GI when the patient can be started on NOAC.    Laurie [name]  11:15 AM  03/12/2016

## 2016-03-12 NOTE — Progress Notes (Signed)
    Patient still with HR 140-160s despite amio bolus and infusion. Per RN, she did better on the dilt gtt. Will stop Cardizem '240mg'$  daily and restart cardizem gtt.    Angelena Form PA-C  [facility]

## 2016-03-12 NOTE — Progress Notes (Signed)
*  PRELIMINARY RESULTS* Echocardiogram 2D Echocardiogram has been performed.  Leavy Cella 03/12/2016, 9:38 AM

## 2016-03-12 NOTE — Progress Notes (Signed)
Pharmacy Antibiotic Note  Laurie [name] is a 81 y.o. female admitted on 03/08/2016 with intra-abdominal infection, sepsis, with ascending cholangitis.   Pharmacy was consulted for Cipro dosing. Abx then changed to Zosyn, pharmacy to assist dosing  Plan: Continue Zosyn 3.375g IV Q8H infused over 4hrs. Follow up renal fxn, culture results, and clinical course.   Height: '5\' 7"'$  (170.2 cm) Weight: 164 lb 14.5 oz (74.8 kg) IBW/kg (Calculated) : 61.6  Temp (24hrs), Avg:98.4 F (36.9 C), Min:97.5 F (36.4 C), Max:99.1 F (37.3 C)   Recent Labs Lab 03/08/16 2003 03/09/16 0213 03/09/16 0510 03/10/16 0533 03/11/16 0055 03/12/16 0328  WBC 15.8*  --  27.1* 16.3* 14.8*  --   CREATININE 0.71 0.81 0.62 0.64 0.67 0.68  LATICACIDVEN  --  2.5* 1.2  --   --   --     Estimated Creatinine Clearance: 61.2 mL/min (by C-G formula based on Cr of 0.68).    Allergies  Allergen Reactions  . Hyoscyamine Other (See Comments)    Mental status changes.  (as of 02/13/16 patient denies any problems with this medication)  . Statins     Muscle aches  . Rofecoxib Rash   Antimicrobials this admission: 4/10 Cipro >>  4/10 4/10 Flagyl >>  4/10 4/10 Zosyn >>  Microbiology results: 4/10 BCx: ngtd 4/11 MRSA PCR: negative  Thank you for allowing pharmacy to be a part of this patient's care.  Gretta Arab PharmD, BCPS Pager [phone] 03/12/2016 12:33 PM

## 2016-03-12 NOTE — Progress Notes (Signed)
PROGRESS NOTE  Laurie [name] TFT:[id number] DOB: 09-16-1935 DOA: 03/08/2016 PCP: Beatrice Lecher, MD Outpatient Specialists:    LOS: 3 days    Subjective: Feels much better, no new complaints. Discussed with cardiology she will be off of Cardizem drip, transferred to telemetry  Brief Narrative: Laurie [name] is a 81 y.o. female with PMH of hypertension, hyperlipidemia, COPD, anxiety, atrial fibrillation not on anticoagulants possibly due to old age and high risk of fall, aortic valve replacement, lung cancer with lobectomy 2009, AAA, ascending aortic dilation, S/P cholecystectomy, who presents with abdominal pain.  Assessment & Plan: Principal Problem:   Abdominal pain Active Problems:   GAD (generalized anxiety disorder)   Essential hypertension, benign   S/P aortic valve replacement   S/P lobectomy of lung   Abdominal aortic aneurysm (HCC)   Ascending aorta dilatation (HCC)   Thoracic aortic aneurysm without rupture (HCC)   HTN (hypertension)   Sepsis (HCC)   COPD (chronic obstructive pulmonary disease) (HCC)   RUQ abdominal pain   Elevated LFTs   Biliary calculus   Acute respiratory failure with hypoxia (HCC)   Atrial fibrillation with RVR (HCC)   Cholangitis   Abdominal pain and sepsis with ascending cholangitis - CT-abd/pelvis showed periportal edema, diffuse pneumobilia, dlatation of the common bile duct to 1.9 cm in diameter, indicating common duct obstruction in the biliary infection - Of note, she was hospitalized in February 2017 with similar problem requiring ERCP at that time as well and had gram-negative bacteremia on cultures for [facility] [place], was susceptible to Augmentin, Cipro and Zosyn. - Gastroenterology consulted, patient underwent an ERCP on 4/10 with removal of debris/pus but without any obvious stones - Abdominal pain improved today, LFTs improving - Continue Zosyn, blood cultures still NGTD. Diet advanced to heart  healthy, she asked for a regular diet.  Atrial fibrillation with rapid ventricular response -Developed A. fib with RVR at heart rate reached 153. Cardiology consulted. -Cardiology recommended to control the pain, fluids status and restart her home dose of metoprolol. -Her rate is controlled, discontinued Cardizem drip switched to oral per cardiology. -CHA2DS2-VASc is at least 6, no anticoagulation for now, likely because of recent sphincterotomy. -I think long-term there is no contraindication for anticoagulation, unless she has frequent falls.  Acute hypoxic respiratory failure with chest pain - Patient found to be having difficulties breathing on 03/10/2016, new oxygen requirement, lung exam shows perfuse wheezing, she appears fluid overloaded. Discontinue IV fluids, IV Lasix. - This is likely secondary to the mild interstitial edema seen on the CXR. -This is resolved, will stop IV diuresis today and wean off of oxygen.  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    DVT prophylaxis: SCD Code Status: DNR Family Communication: no family bedside Disposition Plan: home when ready  Barriers for discharge: IV antibiotics, hypoxia  Consultants:   GI  Procedures:  None   Antimicrobials:  Zosyn     Objective: Filed Vitals:   03/12/16 0730 03/12/16 0844 03/12/16 0847 03/12/16  0956  BP:    140/70  Pulse:      Temp: 98.2 F (36.8 C)     TempSrc: Oral     Resp:      Height:      Weight:      SpO2:  96% 96%     Intake/Output Summary (Last 24 hours) at 03/12/16 1124 Last data filed at 03/12/16 0850  Gross per 24 hour  Intake    770 ml  Output   3500 ml  Net  -2730 ml   Filed Weights   03/10/16 1700 03/11/16 0551 03/12/16 0100  Weight: 78.1 kg (172 lb 2.9 oz) 77.1 kg (169 lb 15.6 oz) 74.8 kg (164 lb 14.5 oz)    Examination: Constitutional: Anxious, tachypneic Filed Vitals:   03/12/16 0730 03/12/16 0844 03/12/16 0847 03/12/16 0956  BP:    140/70  Pulse:      Temp: 98.2 F (36.8 C)     TempSrc: Oral     Resp:      Height:      Weight:      SpO2:  96% 96%    Eyes: PERRL ENMT: Mucous membranes are moist.  Respiratory: Diffuse bilateral wheezing, bibasilar crackles. Increased respiratory effort. + accessory muscle use.  Cardiovascular: Regular rate and rhythm, no murmurs / rubs / gallops. No extremity edema. 2+ pedal pulses. No carotid bruits.  Abdomen: no tenderness, Musculoskeletal: no clubbing / cyanosis.  Neurologic: Nonfocal Psychiatric: Anxious   Data Reviewed: I have personally reviewed following labs and imaging studies  CBC:  Recent Labs Lab 03/08/16 2003 03/09/16 0510 03/10/16 0533 03/11/16 0055  WBC 15.8* 27.1* 16.3* 14.8*  HGB 14.6 12.1 11.1* 11.8*  HCT 42.9 36.6 33.5* 36.2  MCV 84.8 86.5 87.0 86.6  PLT 249 207 168 063   Basic Metabolic Panel:  Recent Labs Lab 03/09/16 0213 03/09/16 0510 03/10/16 0533 03/11/16 0055 03/12/16 0328  NA 130* 132* 135 135 137  K 4.0 4.1 4.2 3.9 3.1*  CL 98* 102 105 100* 95*  CO2 '25 23 25 30 '$ 33*  GLUCOSE 117* 126* 122* 111* 109*  BUN '11 11 11 8 7  '$ CREATININE 0.81 0.62 0.64 0.67 0.68  CALCIUM 8.4* 8.1* 8.2* 8.3* 8.5*   GFR: Estimated Creatinine Clearance: 61.2 mL/min (by C-G formula based on Cr of 0.68). Liver Function Tests:  Recent Labs Lab 03/08/16 2003 03/09/16 0213  03/09/16 0510 03/10/16 0533 03/11/16 0055  AST 323* 229* 176* 63* 37  ALT 234* 243* 209* 119* 92*  ALKPHOS 123 118 106 94 103  BILITOT 1.8* 2.9* 3.0* 2.6* 2.1*  PROT 7.0 6.0* 5.4* 5.1* 5.6*  ALBUMIN 3.9 3.2* 2.9* 2.6* 2.8*    Recent Labs Lab 03/08/16 2003 03/09/16 0505  LIPASE 21 22   No results for input(s): AMMONIA in the last 168 hours. Coagulation Profile:  Recent Labs Lab 03/09/16 0213  INR 1.18   Cardiac Enzymes:  Recent Labs Lab 03/08/16 2159 03/10/16 0859 03/10/16 1424 03/10/16 1823 03/11/16 0055  TROPONINI <0.03 0.04* 0.04* 0.04* 0.03   BNP (last 3 results) No results for input(s): PROBNP in the last 8760 hours. HbA1C: No results for input(s): HGBA1C in the last 72 hours. CBG:  Recent Labs Lab 03/09/16 0728 03/10/16 0812 03/11/16 0903 03/12/16 0807  GLUCAP 108* 123* 95 104*   Lipid Profile: No results for input(s): CHOL,  HDL, LDLCALC, TRIG, CHOLHDL, LDLDIRECT in the last 72 hours. Thyroid Function Tests: No results for input(s): TSH, T4TOTAL, FREET4, T3FREE, THYROIDAB in the last 72 hours. Anemia Panel: No results for input(s): VITAMINB12, FOLATE, FERRITIN, TIBC, IRON, RETICCTPCT in the last 72 hours. Urine analysis:    Component Value Date/Time   COLORURINE YELLOW 03/08/2016 2201   APPEARANCEUR CLEAR 03/08/2016 2201   LABSPEC 1.027 03/08/2016 2201   PHURINE 7.0 03/08/2016 2201   GLUCOSEU NEGATIVE 03/08/2016 2201   HGBUR NEGATIVE 03/08/2016 2201   HGBUR trace-intact 06/06/2008 0907   BILIRUBINUR NEGATIVE 03/08/2016 2201   BILIRUBINUR neg 03/18/2011 1530   KETONESUR NEGATIVE 03/08/2016 2201   PROTEINUR NEGATIVE 03/08/2016 2201   PROTEINUR trace 03/18/2011 1530   UROBILINOGEN 0.2 07/13/2011 1832   UROBILINOGEN 0.2 03/18/2011 1530   NITRITE NEGATIVE 03/08/2016 2201   NITRITE neg 03/18/2011 1530   LEUKOCYTESUR NEGATIVE 03/08/2016 2201   Sepsis Labs: Invalid input(s): PROCALCITONIN, LACTICIDVEN  Recent Results (from the past 240  hour(s))  Culture, blood (x 2)     Status: None (Preliminary result)   Collection Time: 03/09/16  2:20 AM  Result Value Ref Range Status   Specimen Description BLOOD LEFT ARM  Final   Special Requests BOTTLES DRAWN AEROBIC AND ANAEROBIC 5CC  Final   Culture   Final    NO GROWTH 2 DAYS Performed at [facility]    Report Status PENDING  Incomplete  Culture, blood (x 2)     Status: None (Preliminary result)   Collection Time: 03/09/16  2:25 AM  Result Value Ref Range Status   Specimen Description BLOOD BLOOD LEFT HAND  Final   Special Requests BOTTLES DRAWN AEROBIC AND ANAEROBIC 5CC  Final   Culture   Final    NO GROWTH 2 DAYS Performed at [facility]    Report Status PENDING  Incomplete  MRSA PCR Screening     Status: None   Collection Time: 03/10/16  4:45 PM  Result Value Ref Range Status   MRSA by PCR NEGATIVE NEGATIVE Final    Comment:        The GeneXpert MRSA Assay (FDA approved for NASAL specimens only), is one component of a comprehensive MRSA colonization surveillance program. It is not intended to diagnose MRSA infection nor to guide or monitor treatment for MRSA infections.       Radiology Studies: No results found.  Scheduled Meds: . diltiazem  240 mg Oral Daily  . furosemide  20 mg Intravenous BID  . levalbuterol  0.63 mg Nebulization TID  . loratadine  10 mg Oral Daily  . metoprolol  50 mg Oral BID  . piperacillin-tazobactam  3.375 g Intravenous 4 times per day  . potassium chloride  40 mEq Oral Q6H  . psyllium  1 packet Oral Daily  . sodium chloride flush  3 mL Intravenous Q12H  . ursodiol  300 mg Oral BID   Continuous Infusions:     Birdie Hopes, MD [facility] Pager [phone]  If 7PM-7AM, please contact night-coverage [email or web address] Password [facility] 03/12/2016, 11:24 AM

## 2016-03-13 LAB — GLUCOSE, CAPILLARY: Glucose-Capillary: 105 mg/dL — ABNORMAL HIGH (ref 65–99)

## 2016-03-13 LAB — BASIC METABOLIC PANEL
ANION GAP: 8 (ref 5–15)
BUN: 7 mg/dL (ref 6–20)
CALCIUM: 8.8 mg/dL — AB (ref 8.9–10.3)
CO2: 31 mmol/L (ref 22–32)
Chloride: 100 mmol/L — ABNORMAL LOW (ref 101–111)
Creatinine, Ser: 0.71 mg/dL (ref 0.44–1.00)
GFR calc Af Amer: >60 mL/min (ref >60)
GLUCOSE: 115 mg/dL — AB (ref 65–99)
POTASSIUM: 3.4 mmol/L — AB (ref 3.5–5.1)
Sodium: 139 mmol/L (ref 135–145)

## 2016-03-13 LAB — MAGNESIUM: Magnesium: 2 mg/dL (ref 1.7–2.4)

## 2016-03-13 MED ORDER — PIPERACILLIN-TAZOBACTAM 3.375 G IVPB: 3.3750 g | Freq: Three times a day (TID) | INTRAVENOUS | Status: DC

## 2016-03-13 MED ORDER — POLYETHYLENE GLYCOL 3350 17 G PO PACK
17.0000 g | PACK | Freq: Every day | ORAL | Status: DC
  Administered 2016-03-13: 17 g via ORAL
  Filled 2016-03-13 (×3): qty 1

## 2016-03-13 MED ORDER — AMOXICILLIN-POT CLAVULANATE 875-125 MG PO TABS
1.0000 | ORAL_TABLET | Freq: Two times a day (BID) | ORAL | Status: DC
  Administered 2016-03-13 – 2016-03-14 (×2): 1 via ORAL
  Filled 2016-03-13 (×2): qty 1

## 2016-03-13 MED ORDER — RIVAROXABAN 20 MG PO TABS
20.0000 mg | ORAL_TABLET | Freq: Every day | ORAL | Status: DC
  Administered 2016-03-13: 20 mg via ORAL
  Filled 2016-03-13 (×2): qty 1

## 2016-03-13 MED ORDER — LISINOPRIL 20 MG PO TABS
20.0000 mg | ORAL_TABLET | Freq: Every day | ORAL | Status: DC
  Administered 2016-03-13: 20 mg via ORAL
  Filled 2016-03-13: qty 2

## 2016-03-13 MED ORDER — DILTIAZEM HCL ER COATED BEADS 240 MG PO CP24
240.0000 mg | ORAL_CAPSULE | Freq: Every day | ORAL | Status: DC
  Administered 2016-03-13 – 2016-03-14 (×2): 240 mg via ORAL
  Filled 2016-03-13: qty 2
  Filled 2016-03-13: qty 1

## 2016-03-13 MED ORDER — DOCUSATE SODIUM 100 MG PO CAPS
100.0000 mg | ORAL_CAPSULE | Freq: Two times a day (BID) | ORAL | Status: DC
  Administered 2016-03-13 – 2016-03-14 (×2): 100 mg via ORAL
  Filled 2016-03-13 (×2): qty 1

## 2016-03-13 MED ORDER — AMIODARONE HCL 200 MG PO TABS
400.0000 mg | ORAL_TABLET | Freq: Every day | ORAL | Status: DC
  Administered 2016-03-13 – 2016-03-14 (×2): 400 mg via ORAL
  Filled 2016-03-13 (×2): qty 2

## 2016-03-13 MED ORDER — POTASSIUM CHLORIDE CRYS ER 20 MEQ PO TBCR
40.0000 meq | EXTENDED_RELEASE_TABLET | Freq: Four times a day (QID) | ORAL | Status: AC
  Administered 2016-03-13: 40 meq via ORAL
  Filled 2016-03-13: qty 2

## 2016-03-13 NOTE — Evaluation (Signed)
Occupational Therapy Evaluation Patient Details Name: Laurie [name] MRN: [id number] DOB: Apr 22, 1935 Today's Date: 03/13/2016    History of Present Illness 81 yo female admitted with abd pain, sepsis. Hx of HTN, lung cancer, AAA, HTN, cholecystectomy 2015, Afib, anxiety, fall risk, COPD. 4/11 coverted to afib/RVR, transferred to SDU.    Clinical Impression   Pt was admitted for the above. At baseline, she is independent with adls/iadls.  Pt needs min A overall at this time. She will benefit from continued OT. Goals in acute are for supervision level, except for tub transfer.    Follow Up Recommendations  Supervision/Assistance - 24 hour;Home health OT    Equipment Recommendations  None recommended by OT (HHOT can further assess bathroom needs)    Recommendations for Other Services       Precautions / Restrictions Precautions Precautions: Fall Precaution Comments: monitor sats and HR Restrictions Weight Bearing Restrictions: No      Mobility Bed Mobility   Bed Mobility: Supine to Sit     Supine to sit: Supervision Sit to supine: Supervision   General bed mobility comments: for lines  Transfers Overall transfer level: Needs assistance Equipment used: 1 person hand held assist Transfers: Sit to/from Stand Sit to Stand: Min assist         General transfer comment: steadying    Balance           Standing balance support: During functional activity;Single extremity supported Standing balance-Leahy Scale: Poor                              ADL Overall ADL's : Needs assistance/impaired                                       General ADL Comments: pt is able to perform UB adls with set up and LB with min A.  Ambulated around bed to return to bed.  Educated on energy conservation.  Pt believes she will be fine with her commode. Did not practice this session as she was ready to get back to bed     Vision     Perception      Praxis      Pertinent Vitals/Pain Faces Pain Scale: Hurts a little bit Pain Location: abdomen Pain Descriptors / Indicators: Discomfort Pain Intervention(s): Limited activity within patient's tolerance;Monitored during session     Hand Dominance     Extremity/Trunk Assessment Upper Extremity Assessment Upper Extremity Assessment: Overall WFL for tasks assessed           Communication Communication Communication: No difficulties   Cognition Arousal/Alertness: Awake/alert Behavior During Therapy: WFL for tasks assessed/performed Overall Cognitive Status: Within Functional Limits for tasks assessed                     General Comments       Exercises       Shoulder Instructions      Home Living Family/patient expects to be discharged to:: Private residence Living Arrangements: Children Available Help at Discharge: Family               Bathroom Shower/Tub: Tub/shower unit Shower/tub characteristics: Architectural technologist: Standard         Additional Comments: has vanity next to toilet      Prior Functioning/Environment Level of Independence: Independent  Comments: performs IADLs    OT Diagnosis: Generalized weakness   OT Problem List: Decreased activity tolerance;Impaired balance (sitting and/or standing);Decreased knowledge of use of DME or AE;Pain;Cardiopulmonary status limiting activity   OT Treatment/Interventions: Self-care/ADL training;DME and/or AE instruction;Patient/family education;Balance training;Energy conservation    OT Goals(Current goals can be found in the care plan section) Acute Rehab OT Goals Patient Stated Goal: home, hopefully tomorrow OT Goal Formulation: With patient Time For Goal Achievement: 03/20/16 Potential to Achieve Goals: Good ADL Goals Pt Will Transfer to Toilet: with supervision;regular height toilet;ambulating Pt Will Perform Toileting - Clothing Manipulation and hygiene: with supervision;sit  to/from stand Additional ADL Goal #1: pt will complete adl wtih set up, sit to stand and initiate at least one rest break if needed Additional ADL Goal #2: Pt will perform tub transfer with min guard   OT Frequency: Min 2X/week   Barriers to D/C:            Co-evaluation              End of Session    Activity Tolerance: Patient limited by fatigue Patient left: in bed;with call bell/phone within reach;with bed alarm set   Time: 3710-6269 OT Time Calculation (min): 10 min Charges:  OT General Charges $OT Visit: 1 Procedure OT Evaluation $OT Eval Moderate Complexity: 1 Procedure G-Codes:    Caylor Tallarico Mar 28, 2016, 10:01 AM  Lesle Chris, OTR/L [phone] 28-Mar-2016

## 2016-03-13 NOTE — Progress Notes (Signed)
PROGRESS NOTE  Laurie [name] XNA:[id number] DOB: 30-Sep-1935 DOA: 03/08/2016 PCP: Beatrice Lecher, MD Outpatient Specialists:    LOS: 4 days    Subjective: Had A. fib with RVR after discontinuation of the Cardizem drip. Right now Laurie [name] is on metoprolol, Cardizem and amiodarone. Potential discharge in a.m. if heart rate is controlled.  Brief Narrative: Laurie [name] is a 81 y.o. female with PMH of hypertension, hyperlipidemia, COPD, anxiety, atrial fibrillation not on anticoagulants possibly due to old age and high risk of fall, aortic valve replacement, lung cancer with lobectomy 2009, AAA, ascending aortic dilation, S/P cholecystectomy, who presents with abdominal pain.  Assessment & Plan: Principal Problem:   Abdominal pain Active Problems:   GAD (generalized anxiety disorder)   Essential hypertension, benign   S/P aortic valve replacement   S/P lobectomy of lung   Abdominal aortic aneurysm (HCC)   Ascending aorta dilatation (HCC)   Thoracic aortic aneurysm without rupture (HCC)   HTN (hypertension)   Sepsis (HCC)   COPD (chronic obstructive pulmonary disease) (HCC)   RUQ abdominal pain   Elevated LFTs   Biliary calculus   Acute respiratory failure with hypoxia (HCC)   Atrial fibrillation with RVR (HCC)   Cholangitis   Chronic obstructive pulmonary disease (HCC)   Dyspnea   Abdominal pain and sepsis with ascending cholangitis - CT-abd/pelvis showed periportal edema, diffuse pneumobilia, dlatation of the common bile duct to 1.9 cm in diameter, indicating common duct obstruction in the biliary infection - Of note, Laurie [name] was hospitalized in February 2017 with similar problem requiring ERCP at that time as well and had gram-negative bacteremia on cultures for [facility] [place], was susceptible to Augmentin, Cipro and Zosyn. - Gastroenterology consulted, patient underwent an ERCP on 4/10 with removal of debris/pus but without any obvious stones -  Abdominal pain is better, cultures NGTD Laurie [name] still on Zosyn will switch to Augmentin.  Atrial fibrillation with rapid ventricular response -Developed A. fib with RVR at heart rate reached 153. Cardiology consulted. -Laurie [name] was on Cardizem drip, switched to oral Cardizem and amiodarone. Back on her home dose of metoprolol 75 mg -CHA2DS2-VASc is at least 6, started on Xarelto.  Acute hypoxic respiratory failure with chest pain - Patient found to be having difficulties breathing on 03/10/2016, new oxygen requirement, lung exam shows perfuse wheezing, Laurie [name] appears fluid overloaded. Discontinue IV fluids, IV Lasix. - This is likely secondary to the mild interstitial edema seen on the CXR. -This is resolved, will stop IV diuresis today and wean off of oxygen.  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 Magnesium is 2.0.   DVT prophylaxis: SCD Code Status: DNR Family Communication: no family bedside Disposition Plan: home when ready  Barriers for discharge: IV antibiotics, hypoxia  Consultants:   GI  Procedures:   None   Antimicrobials:  Zosyn  Objective: Filed Vitals:   03/13/16 0807 03/13/16 0813 03/13/16 1010 03/13/16 1200  BP:  156/65 144/56 156/64  Pulse:   98 66  Temp:    98.7 F (37.1 C)  TempSrc:    Axillary  Resp:     20  Height:      Weight:      SpO2: 96%   97%    Intake/Output Summary (Last 24 hours) at 03/13/16 1236 Last data filed at 03/13/16 0614  Gross per 24 hour  Intake 709.44 ml  Output   2425 ml  Net -1715.56 ml   Filed Weights   03/11/16 0551 03/12/16 0100 03/13/16 0500  Weight: 77.1 kg (169 lb 15.6 oz) 74.8 kg (164 lb 14.5 oz) 67.8 kg (149 lb 7.6 oz)    Examination: Constitutional: Anxious, tachypneic Filed Vitals:   03/13/16 0807 03/13/16 0813 03/13/16 1010 03/13/16 1200  BP:  156/65 144/56 156/64  Pulse:   98 66  Temp:    98.7 F (37.1 C)  TempSrc:    Axillary  Resp:    20  Height:      Weight:      SpO2: 96%   97%   Eyes: PERRL ENMT: Mucous membranes are moist.  Respiratory: Diffuse bilateral wheezing, bibasilar crackles. Increased respiratory effort. + accessory muscle use.  Cardiovascular: Regular rate and rhythm, no murmurs / rubs / gallops. No extremity edema. 2+ pedal pulses. No carotid bruits.  Abdomen: no tenderness, Musculoskeletal: no clubbing / cyanosis.  Neurologic: Nonfocal Psychiatric: Anxious   Data Reviewed: I have personally reviewed following labs and imaging studies  CBC:  Recent Labs Lab 03/08/16 2003 03/09/16 0510 03/10/16 0533 03/11/16 0055  WBC 15.8* 27.1* 16.3* 14.8*  HGB 14.6 12.1 11.1* 11.8*  HCT 42.9 36.6 33.5* 36.2  MCV 84.8 86.5 87.0 86.6  PLT 249 207 168 789   Basic Metabolic Panel:  Recent Labs Lab 03/09/16 0510 03/10/16 0533 03/11/16 0055 03/12/16 0328 03/13/16 0323  NA 132* 135 135 137 139  K 4.1 4.2 3.9 3.1* 3.4*  CL 102 105 100* 95* 100*  CO2 '23 25 30 '$ 33* 31  GLUCOSE 126* 122* 111* 109* 115*  BUN '11 11 8 7 7  '$ CREATININE 0.62 0.64 0.67 0.68 0.71  CALCIUM 8.1* 8.2* 8.3* 8.5* 8.8*  MG  --   --   --   --  2.0   GFR: Estimated Creatinine Clearance: 56.4 mL/min (by C-G formula based on Cr of 0.71). Liver Function Tests:  Recent Labs Lab 03/08/16 2003 03/09/16 0213 03/09/16 0510 03/10/16 0533  03/11/16 0055  AST 323* 229* 176* 63* 37  ALT 234* 243* 209* 119* 92*  ALKPHOS 123 118 106 94 103  BILITOT 1.8* 2.9* 3.0* 2.6* 2.1*  PROT 7.0 6.0* 5.4* 5.1* 5.6*  ALBUMIN 3.9 3.2* 2.9* 2.6* 2.8*    Recent Labs Lab 03/08/16 2003 03/09/16 0505  LIPASE 21 22   No results for input(s): AMMONIA in the last 168 hours. Coagulation Profile:  Recent Labs Lab 03/09/16 0213  INR 1.18   Cardiac Enzymes:  Recent Labs Lab 03/08/16 2159 03/10/16 0859 03/10/16 1424 03/10/16 1823 03/11/16 0055  TROPONINI <0.03 0.04* 0.04* 0.04* 0.03   BNP (last 3 results) No results for input(s): PROBNP in the last 8760 hours. HbA1C: No results for input(s): HGBA1C in the last 72 hours. CBG:  Recent Labs Lab 03/09/16 0728 03/10/16 0812 03/11/16 0903 03/12/16 0807 03/13/16 0810  GLUCAP 108* 123* 95 104* 105*   Lipid Profile: No  results for input(s): CHOL, HDL, LDLCALC, TRIG, CHOLHDL, LDLDIRECT in the last 72 hours. Thyroid Function Tests: No results for input(s): TSH, T4TOTAL, FREET4, T3FREE, THYROIDAB in the last 72 hours. Anemia Panel: No results for input(s): VITAMINB12, FOLATE, FERRITIN, TIBC, IRON, RETICCTPCT in the last 72 hours. Urine analysis:    Component Value Date/Time   COLORURINE YELLOW 03/08/2016 2201   APPEARANCEUR CLEAR 03/08/2016 2201   LABSPEC 1.027 03/08/2016 2201   PHURINE 7.0 03/08/2016 2201   GLUCOSEU NEGATIVE 03/08/2016 2201   HGBUR NEGATIVE 03/08/2016 2201   HGBUR trace-intact 06/06/2008 0907   BILIRUBINUR NEGATIVE 03/08/2016 2201   BILIRUBINUR neg 03/18/2011 1530   KETONESUR NEGATIVE 03/08/2016 2201   PROTEINUR NEGATIVE 03/08/2016 2201   PROTEINUR trace 03/18/2011 1530   UROBILINOGEN 0.2 07/13/2011 1832   UROBILINOGEN 0.2 03/18/2011 1530   NITRITE NEGATIVE 03/08/2016 2201   NITRITE neg 03/18/2011 1530   LEUKOCYTESUR NEGATIVE 03/08/2016 2201   Sepsis Labs: Invalid input(s): PROCALCITONIN, LACTICIDVEN  Recent Results (from the past 240 hour(s))   Culture, blood (x 2)     Status: None (Preliminary result)   Collection Time: 03/09/16  2:20 AM  Result Value Ref Range Status   Specimen Description BLOOD LEFT ARM  Final   Special Requests BOTTLES DRAWN AEROBIC AND ANAEROBIC 5CC  Final   Culture   Final    NO GROWTH 3 DAYS Performed at [facility]    Report Status PENDING  Incomplete  Culture, blood (x 2)     Status: None (Preliminary result)   Collection Time: 03/09/16  2:25 AM  Result Value Ref Range Status   Specimen Description BLOOD BLOOD LEFT HAND  Final   Special Requests BOTTLES DRAWN AEROBIC AND ANAEROBIC 5CC  Final   Culture   Final    NO GROWTH 3 DAYS Performed at [facility]    Report Status PENDING  Incomplete  MRSA PCR Screening     Status: None   Collection Time: 03/10/16  4:45 PM  Result Value Ref Range Status   MRSA by PCR NEGATIVE NEGATIVE Final    Comment:        The GeneXpert MRSA Assay (FDA approved for NASAL specimens only), is one component of a comprehensive MRSA colonization surveillance program. It is not intended to diagnose MRSA infection nor to guide or monitor treatment for MRSA infections.       Radiology Studies: No results found.  Scheduled Meds: . amiodarone  400 mg Oral Daily  . diltiazem  240 mg Oral Daily  . lisinopril  20 mg Oral Daily  . loratadine  10 mg Oral Daily  . metoprolol  75 mg Oral BID  . piperacillin-tazobactam (ZOSYN)  IV  3.375 g Intravenous Q8H  . potassium chloride  40 mEq Oral Q6H  . psyllium  1 packet Oral Daily  . rivaroxaban  20 mg Oral Q supper  . sodium chloride flush  3 mL Intravenous Q12H  . ursodiol  300 mg Oral BID   Continuous Infusions:     Birdie Hopes, MD [facility] Pager [phone]  If 7PM-7AM, please contact night-coverage [email or web address] Password [facility] 03/13/2016, 12:36 PM

## 2016-03-13 NOTE — Progress Notes (Signed)
Patient Name: Laurie [name] Date of Encounter: 03/13/2016     Principal Problem:   Abdominal pain Active Problems:   GAD (generalized anxiety disorder)   Essential hypertension, benign   S/P aortic valve replacement   S/P lobectomy of lung   Abdominal aortic aneurysm (HCC)   Ascending aorta dilatation (HCC)   Thoracic aortic aneurysm without rupture (HCC)   HTN (hypertension)   Sepsis (HCC)   COPD (chronic obstructive pulmonary disease) (HCC)   RUQ abdominal pain   Elevated LFTs   Biliary calculus   Acute respiratory failure with hypoxia (HCC)   Atrial fibrillation with RVR (HCC)   Cholangitis   Chronic obstructive pulmonary disease (HCC)   Dyspnea    SUBJECTIVE  Breathing back to baseline. Feeling well. No CP or SOB. Discussed starting a NOAC. She is willing to take one.   CURRENT MEDS . levalbuterol  0.63 mg Nebulization TID  . loratadine  10 mg Oral Daily  . metoprolol  75 mg Oral BID  . piperacillin-tazobactam  3.375 g Intravenous 4 times per day  . potassium chloride  40 mEq Oral Q6H  . psyllium  1 packet Oral Daily  . sodium chloride flush  3 mL Intravenous Q12H  . ursodiol  300 mg Oral BID    OBJECTIVE  Filed Vitals:   03/13/16 0300 03/13/16 0407 03/13/16 0500 03/13/16 0602  BP: 137/52 146/65 174/71 157/71  Pulse: 65 66 65 60  Temp:  98.8 F (37.1 C)    TempSrc:  Oral    Resp: '22 19 19 23  '$ Height:      Weight:   149 lb 7.6 oz (67.8 kg)   SpO2: 91% 89% 97% 93%    Intake/Output Summary (Last 24 hours) at 03/13/16 0719 Last data filed at 03/13/16 4496  Gross per 24 hour  Intake 1279.44 ml  Output   3325 ml  Net -2045.56 ml   Filed Weights   03/11/16 0551 03/12/16 0100 03/13/16 0500  Weight: 169 lb 15.6 oz (77.1 kg) 164 lb 14.5 oz (74.8 kg) 149 lb 7.6 oz (67.8 kg)    PHYSICAL EXAM  General: Well developed, well nourished, elderly female Head: Eyes PERRLA, No xanthomas. Normocephalic and atraumatic, oropharynx without edema or  exudate. Dentition: poor Lungs: CTAB Heart: Heart irregular rate and rhythm with S1, S2; soft murmur. pulses are 2+ all 4 extrem.  Neck: No carotid bruits. No lymphadenopathy.no JVD  Abdomen: Bowel sounds present, abdomen soft and non-tender without masses or hernias noted. Msk: No spine or cva tenderness. No weakness, no joint deformities or effusions. Extremities: No clubbing or cyanosis. No edema.  Neuro: Alert and oriented X 3. No focal deficits noted. Psych: Good affect, responds appropriately Skin: No rashes or lesions noted.  Accessory Clinical Findings  CBC  Recent Labs  03/11/16 0055  WBC 14.8*  HGB 11.8*  HCT 36.2  MCV 86.6  PLT 759   Basic Metabolic Panel  Recent Labs  03/12/16 0328 03/13/16 0323  NA 137 139  K 3.1* 3.4*  CL 95* 100*  CO2 33* 31  GLUCOSE 109* 115*  BUN 7 7  CREATININE 0.68 0.71  CALCIUM 8.5* 8.8*  MG  --  2.0   Liver Function Tests  Recent Labs  03/11/16 0055  AST 37  ALT 92*  ALKPHOS 103  BILITOT 2.1*  PROT 5.6*  ALBUMIN 2.8*   No results for input(s): LIPASE, AMYLASE in the last 72 hours. Cardiac Enzymes  Recent Labs  03/10/16  1424 03/10/16 1823 03/11/16 0055  TROPONINI 0.04* 0.04* 0.03    TELE  NSR with PACs and PVCS. Short bursts of afib   Radiology/Studies  Ct Chest W Contrast  03/08/2016  CLINICAL DATA:  Mild dyspnea with upper abdominal pain. EXAM: CT CHEST WITH CONTRAST TECHNIQUE: Multidetector CT imaging of the chest was performed during intravenous contrast administration. CONTRAST:  56m ISOVUE-300 IOPAMIDOL (ISOVUE-300) INJECTION 61% COMPARISON:  08/15/2015 FINDINGS: There is stable postsurgical irregularity on the right. There is a noncalcified 5 x 6 mm nodule in the left lower lobe posterior base. This has been present over the past several examinations dating back to at least 02/17/2010, when it measured 5.0 mm. No suspicious nodules are evident. There is prominent interlobular septal thickening  throughout both lungs. This is new from 08/15/2015 and hit likely represents interstitial fluid. The vasculature also appears moderately prominent. This may represent congestive heart failure. No frank alveolar edema. No effusions. There is mild unchanged pleural thickening of the right posterior hemi thorax. There is no pathologic adenopathy.  Airways are patent. Upper abdomen is notable for pneumobilia, unchanged. There is unchanged dilatation of the thoracic aorta, measuring 4.4 cm in the ascending aorta and 3.8 cm in the descending. No dissection or other acute vascular abnormality. IMPRESSION: 1. Interlobular septal thickening throughout both lungs which is new, likely due to interstitial fluid. This could represent CHF as the vasculature also appears moderately prominent. 2. Stable left lower lobe posterior base nodule. No suspicious nodules. 3. Unchanged dilatation of the thoracic aorta. Electronically Signed   By: DAndreas NewportM.D.   On: 03/08/2016 21:44   Ct Abdomen Pelvis W Contrast  03/08/2016  CLINICAL DATA:  Acute onset of mid abdominal pain. Initial encounter. EXAM: CT ABDOMEN AND PELVIS WITH CONTRAST TECHNIQUE: Multidetector CT imaging of the abdomen and pelvis was performed using the standard protocol following bolus administration of intravenous contrast. CONTRAST:  838mISOVUE-300 IOPAMIDOL (ISOVUE-300) INJECTION 61% COMPARISON:  CTA of the chest, abdomen and pelvis performed 08/15/2015 FINDINGS: Bibasilar scarring is noted, with trace right-sided pleural fluid. A 7 mm nodule is noted at the left lung base. Periportal edema is noted. Diffuse pneumobilia is seen. There is dilatation of the common bile duct to 1.9 cm in diameter. Would correlate for any evidence of postcholecystectomy syndrome. The liver is otherwise grossly unremarkable. The spleen is unremarkable in appearance. The patient is status post cholecystectomy. The pancreas and adrenal glands are grossly unremarkable. The kidneys  are unremarkable in appearance. There is no evidence of hydronephrosis. No renal or ureteral stones are seen. No perinephric stranding is appreciated. No free fluid is identified. The small bowel is unremarkable in appearance. The stomach is within normal limits. No acute vascular abnormalities are seen. There is mild aneurysmal dilatation of the infrarenal abdominal aorta to 3.4 cm in transverse dimension. Scattered calcification is noted along the abdominal aorta and its branches. The patient is status post appendectomy. Scattered diverticulosis is noted along the transverse, descending and sigmoid colon, without evidence of diverticulitis. The bladder is moderately distended and partially filled with contrast. The uterus is grossly unremarkable. Scattered prominent periuterine vessels could reflect pelvic congestion syndrome in the appropriate clinical situation. The ovaries are relatively symmetric. No inguinal lymphadenopathy is seen. No acute osseous abnormalities are identified. IMPRESSION: 1. Periportal edema noted. Diffuse pneumobilia seen. Dilatation of the common bile duct to 1.9 cm in diameter. Would correlate for any evidence of postcholecystectomy syndrome. 2. Mild aneurysmal dilatation of the infrarenal abdominal aorta to  3.4 cm in transverse dimension. Scattered calcification along the abdominal aorta and its branches. Recommend followup by ultrasound in 3 years. This recommendation follows ACR consensus guidelines: White Paper of the ACR Incidental Findings Committee II on Vascular Findings. J Am Coll Radiol 2013; 37:628-315 3. Scattered prominent periuterine vessels could reflect pelvic congestion syndrome in the appropriate clinical situation. 4. Bibasilar scarring, with trace right-sided pleural fluid. 7 mm nodule at the left lung base. It is difficult to determine whether this has changed in size since the prior study. Non-contrast chest CT at 6-12 months is recommended. If the nodule is stable  at time of repeat CT, then future CT at 18-24 months (from today's scan) is considered optional for low-risk patients, but is recommended for high-risk patients. This recommendation follows the consensus statement: Guidelines for Management of Incidental Pulmonary Nodules Detected on CT Images:From the Fleischner Society 2017; published online before print (10.1148/radiol.1761607371). 5. Scattered diverticulosis along the transverse, descending and sigmoid colon, without evidence of diverticulitis. Electronically Signed   By: Garald Balding M.D.   On: 03/08/2016 23:46   Dg Chest Port 1 View  03/10/2016  CLINICAL DATA:  Shortness of breath and wheezing, history of COPD, former smoker, coronary artery disease, valvular heart disease EXAM: PORTABLE CHEST 1 VIEW COMPARISON:  CT scan of the chest of March 08, 2016 FINDINGS: The lungs remain hyperinflated. The interstitial markings are diffusely increased and more conspicuous than on the earlier CT scan. Areas of patchy alveolar opacity are noted in the right upper and lower lobes. There is pleural thickening or pleural fluid along the right lateral thoracic wall. The left lung exhibits no alveolar infiltrate but the interstitial markings are increased diffusely. The cardiac silhouette is top-normal in size. There is mild central pulmonary vascular prominence. The patient has undergone previous median sternotomy. IMPRESSION: COPD with superimposed patchy interstitial and alveolar pneumonia in the right lung with pleural fluid lying along the convexity of the right lung. Chronic underlying interstitial disease with increased conspicuity due to mild pulmonary interstitial edema. Electronically Signed   By: David  Martinique M.D.   On: 03/10/2016 07:52   Dg Ercp  03/09/2016  CLINICAL DATA:  Diffuse biliary dilatation the common cholangitis EXAM: ERCP with sphincterotomy and balloon sweep TECHNIQUE: Multiple spot images obtained with the fluoroscopic device and submitted for  interpretation post-procedure. FLUOROSCOPY TIME:  Number of Acquired Images:  10 COMPARISON:  03/08/2016 FINDINGS: Limited imaging during the ERCP procedure. There is diffuse severe dilatation of the common bile duct. Intrahepatic biliary dilatation noted. There is also pneumobilia. Balloon sweep performed of the dilated duct system. Access removed. No stent visualized or inserted. IMPRESSION: Limited imaging during the ERCP with balloon sweep and sphincterotomy. Diffuse biliary dilatation noted. Please refer to the ERCP report for further details. These images were submitted for radiologic interpretation only. Please see the procedural report for the amount of contrast and the fluoroscopy time utilized. Electronically Signed   By: Jerilynn Mages.  Shick M.D.   On: 03/09/2016 15:57   2D ECHO: 03/12/2016 LV EF: 60% - 65% Study Conclusions - Left ventricle: The cavity size was normal. Wall thickness was  normal. Systolic function was normal. The estimated ejection  fraction was in the range of 60% to 65%. Wall motion was normal;  there were no regional wall motion abnormalities. Features are  consistent with a pseudonormal left ventricular filling pattern,  with concomitant abnormal relaxation and increased filling  pressure (grade 2 diastolic dysfunction). - Aortic valve: Moderately calcified annulus. Moderately  thickened  leaflets. There was very mild stenosis. - Mitral valve: The findings are consistent with mild stenosis.   ASSESSMENT AND PLAN Laurie [name] is a 81 y.o. year old female with a history of hypertension, hyperlipidemia, COPD, anxiety, PAF, aortic valve replacement with a tissue valve, lung cancer with lobectomy 2009, AAA, s/p AVR, ascending aortic dilation, cholecystectomy who was admitted to [facility] on 03/09/16 with abdominal pain. Seen by GI and ERCP for CBD stones was performed. Cholangitis, CBD sludge and pneumobilia were seen. She was diagnosed with sepsis 2nd ascending  cholangitis. She went into afib with RVR with chest pain and cardiology was consulted.   Acute on chronic CHF: previously normal LV function on ECHO in 2015. Repeat 2D ECHO with normal LV function, G2DD, mild AS and mild MS. -- Felt to be volume overloaded 2/2 IVFs. CXR with CHF. Started on IV lasix '20mg'$  BID with brisk diuresis. Now breathing back to baseline and IV lasix discontinued. I don't think she will need to go home on lasix. CHF exacerbation likely from diastolic dysfunction and aggressive IVF treatment.   PAF/afib with RVR: she was in and out of aib. I converted dilt gtt to long acting Cardizem yesterday and she went back into afib with RVR. We started IV amio which did not seem to help. IV dilt added back. Now back in NSR. Now that amio is on board, I will try to again switch her from dilt gtt to '240mg'$  Cardizem. Can switch IV amio to PO '400mg'$  BID. She may need to stay on this at discharge with her intermittent and rapid afib. Continue Lopressor '75mg'$  BID as well.  -- CHADSVASC score of at least 6 (CHF, HTN, age, vasc dz, Fsex ).I have discussed with Dr. Teena Irani and Theodis Aguas PA-C. They have cleared the patient for NOAC from a GI standpoint. I will start Xarelto '20mg'$  daily.   HTN: BP elevated. Will add back her home Linsiopril but at a lower dose (since she is now on Cardizem as well). Continue home Lopressor '75mg'$  BID.   Elevated troponin: low and flat c/w demand ischemia in the setting of sepsis and afib with RVR. Last trop normal. No more chest pain.   HLD: statin intolerant   GI: Repeat ERCP done 4/10 per Dr. Fuller Plan, debris / sludge / puss removed with balloon sweep although no clear obstructing stone. Since the procedure her WBC and bilirubin / ALT are downtrending. GI signed off.   Hypokalemia: 2/2 diuresis. This is being supplemented  AS S/p AVR (2009): repeat 2D ECHO with no mention of prosthetic valve. Mild AS.   Carotid artery disease: follow up dopplers due in  10/2016  Dispo: likely home tomorrow or the next day if she doesn't go back into afib with RVR. She will need close outpatient follow up with cardiology.   Judy Pimple PA-C  Pager [phone]  History and all data above reviewed.  Patient examined.  I agree with the findings as above.  No pain.  No SOB The patient exam reveals COR:RRR  ,  Lungs: Improved breath sounds  ,  Abd: Positive bowel sounds, no rebound no guarding, Ext No edema  .  All available labs, radiology testing, previous records reviewed. Agree with documented assessment and plan. Atrial fib:  Now back in NSR.  Switched to PO amio and Dilt as above.  Now on Xarelto.  Discussed the risks/benefits of this.  She should go home on a amio until we see her  backMinus Breeding  10:39 AM  03/13/2016

## 2016-03-13 NOTE — Progress Notes (Signed)
Report was called and given to [facility] . Patient will be transported there shortly

## 2016-03-13 NOTE — Progress Notes (Signed)
Pt is transported to [place].

## 2016-03-13 NOTE — Progress Notes (Signed)
Physical Therapy Treatment Patient Details Name: Laurie [name] MRN: [id number] DOB: [birth year] Today's Date: 03/13/2016    History of Present Illness 81 yo female admitted with abd pain, sepsis. Hx of HTN, lung cancer, AAA, HTN, cholecystectomy 2015, Afib, anxiety, fall risk, COPD. 4/11 coverted to afib/RVR, transferred to SDU.     PT Comments    The patient remains weak and unsteady. Did not push ambulation  As  Her meds have  Just been changed. Continue mobility with patient as she is able. Plans DC  Home. Son lives w/ patient.  Follow Up Recommendations  Home health PT;Supervision/Assistance - 24 hour     Equipment Recommendations  None recommended by PT    Recommendations for Other Services       Precautions / Restrictions Precautions Precautions: Fall Precaution Comments: monitor sats and HR Restrictions Weight Bearing Restrictions: No    Mobility  Bed Mobility   Bed Mobility: Supine to Sit     Supine to sit: Supervision     General bed mobility comments: for safety  Transfers Overall transfer level: Needs assistance Equipment used: 1 person hand held assist Transfers: Sit to/from Stand Sit to Stand: Min assist         General transfer comment: steady assist  upon standing, wobbly.  Ambulation/Gait Ambulation/Gait assistance: Min assist Ambulation Distance (Feet): 15 Feet Assistive device: 1 person hand held assist (held to bed) Gait Pattern/deviations: Step-through pattern     General Gait Details: unsteady. required intermittent assist to stabilize. sats to 90% on RA, back to 96 on 1 l. HR 77. RN just turned of amio, had PO meds prior.    Stairs            Wheelchair Mobility    Modified Rankin (Stroke Patients Only)       Balance           Standing balance support: During functional activity;Single extremity supported Standing balance-Leahy Scale: Poor                      Cognition Arousal/Alertness:  Awake/alert                          Exercises      General Comments        Pertinent Vitals/Pain Faces Pain Scale: Hurts a little bit Pain Location: abdomen Pain Descriptors / Indicators: Discomfort Pain Intervention(s): Monitored during session    Home Living                      Prior Function            PT Goals (current goals can now be found in the care plan section) Progress towards PT goals: Progressing toward goals    Frequency  Min 3X/week    PT Plan Current plan remains appropriate    Co-evaluation             End of Session   Activity Tolerance: Patient limited by fatigue Patient left: in chair;with call bell/phone within reach;with chair alarm set     Time: 1287-8676 PT Time Calculation (min) (ACUTE ONLY): 25 min  Charges:  $Gait Training: 8-22 mins $Self Care/Home Management: 8-22                    G Codes:      Claretha Cooper 03/13/2016, 9:10 AM Tresa Endo PT [phone]

## 2016-03-14 DIAGNOSIS — R0600 Dyspnea, unspecified: Secondary | ICD-10-CM

## 2016-03-14 DIAGNOSIS — A419 Sepsis, unspecified organism: Principal | ICD-10-CM

## 2016-03-14 DIAGNOSIS — I10 Essential (primary) hypertension: Secondary | ICD-10-CM

## 2016-03-14 LAB — CULTURE, BLOOD (ROUTINE X 2)
CULTURE: NO GROWTH
Culture: NO GROWTH

## 2016-03-14 LAB — BASIC METABOLIC PANEL
Anion gap: 9 (ref 5–15)
BUN: 7 mg/dL (ref 6–20)
CHLORIDE: 102 mmol/L (ref 101–111)
CO2: 28 mmol/L (ref 22–32)
Calcium: 9 mg/dL (ref 8.9–10.3)
Creatinine, Ser: 0.57 mg/dL (ref 0.44–1.00)
GFR calc Af Amer: >60 mL/min (ref >60)
GFR calc non Af Amer: >60 mL/min (ref >60)
Glucose, Bld: 107 mg/dL — ABNORMAL HIGH (ref 65–99)
Potassium: 3.3 mmol/L — ABNORMAL LOW (ref 3.5–5.1)
Sodium: 139 mmol/L (ref 135–145)

## 2016-03-14 LAB — GLUCOSE, CAPILLARY: Glucose-Capillary: 99 mg/dL (ref 65–99)

## 2016-03-14 MED ORDER — AMOXICILLIN-POT CLAVULANATE 875-125 MG PO TABS: 1.0000 | ORAL_TABLET | Freq: Two times a day (BID) | ORAL | Status: DC

## 2016-03-14 MED ORDER — RIVAROXABAN 20 MG PO TABS: 20.0000 mg | ORAL_TABLET | Freq: Every day | ORAL | Status: DC

## 2016-03-14 MED ORDER — POTASSIUM CHLORIDE CRYS ER 10 MEQ PO TBCR: 30.0000 meq | EXTENDED_RELEASE_TABLET | Freq: Once | ORAL | Status: DC

## 2016-03-14 MED ORDER — DILTIAZEM HCL ER COATED BEADS 240 MG PO CP24: 240.0000 mg | ORAL_CAPSULE | Freq: Every day | ORAL | Status: DC

## 2016-03-14 MED ORDER — LISINOPRIL 20 MG PO TABS
40.0000 mg | ORAL_TABLET | Freq: Every day | ORAL | Status: DC
  Administered 2016-03-14: 40 mg via ORAL
  Filled 2016-03-14: qty 2

## 2016-03-14 MED ORDER — AMIODARONE HCL 400 MG PO TABS: 400.0000 mg | ORAL_TABLET | Freq: Every day | ORAL | Status: DC

## 2016-03-14 MED ORDER — URSODIOL 500 MG PO TABS: 500.0000 mg | ORAL_TABLET | Freq: Two times a day (BID) | ORAL | Status: DC

## 2016-03-14 MED ORDER — POTASSIUM CHLORIDE CRYS ER 20 MEQ PO TBCR
40.0000 meq | EXTENDED_RELEASE_TABLET | Freq: Four times a day (QID) | ORAL | Status: DC
  Administered 2016-03-14: 40 meq via ORAL
  Filled 2016-03-14: qty 2

## 2016-03-14 NOTE — Discharge Summary (Signed)
Physician Discharge Summary  JAMILLA GALLI OXB:[id number] DOB: Aug 31, 1935 DOA: 03/08/2016  PCP: Beatrice Lecher, MD  Admit date: 03/08/2016 Discharge date: 03/14/2016  Time spent: 40 minutes  Recommendations for Outpatient Follow-up:  1. Follow-up with primary physician within 1 week.Follow-up with cardiology in 2 weeks. 2. Continue Augmentin for 10 more days to complete a total of 2 weeks of antibiotics. 3. Started on amiodarone, Cardizem, Xarelto and metoprolol continued for new onset atrial fibrillation. 4. Check CBC and CMP in 1 week   Discharge Diagnoses:  Principal Problem:   Abdominal pain Active Problems:   GAD (generalized anxiety disorder)   Essential hypertension, benign   S/P aortic valve replacement   S/P lobectomy of lung   Abdominal aortic aneurysm (HCC)   Ascending aorta dilatation (HCC)   Thoracic aortic aneurysm without rupture (HCC)   HTN (hypertension)   Sepsis (HCC)   COPD (chronic obstructive pulmonary disease) (HCC)   RUQ abdominal pain   Elevated LFTs   Biliary calculus   Acute respiratory failure with hypoxia (HCC)   Atrial fibrillation with RVR (HCC)   Cholangitis   Chronic obstructive pulmonary disease ([place])   Dyspnea   Discharge Condition: Stable  Diet recommendation: Heart healthy  Filed Weights   03/12/16 0100 03/13/16 0500 03/14/16 0645  Weight: 74.8 kg (164 lb 14.5 oz) 67.8 kg (149 lb 7.6 oz) 72.53 kg (159 lb 14.4 oz)    History of present illness:  Laurie [name] is a 81 y.o. female with PMH of hypertension, hyperlipidemia, COPD, anxiety, atrial fibrillation not on anticoagulants possibly due to old age and high risk of fall, aortic valve replacement, lung cancer with lobectomy 2009, AAA, ascending aortic dilation, S/P cholecystectomy, who presents with abdominal pain. The patient reported she started having abdominal pain today. It is located to the right upper quadrant, moderate, constant, nonradiating. It is not associated  with fever, chills, nausea, vomiting or diarrhea. Patient does not have chest pain, shortness breath, symptoms of UTI or unilateral weakness.  In ED, patient was found to have WBC 15.8, negative troponin, BNP 115, lipase 21, lactate 2.5, negative urinalysis, tachypnea, no tachycardia, sodium 132, creatinine 0.71. Abnormal liver function with AST 323, ALT 234, total bilirubin 1.8. Oxygen desaturation to 83% which responded to non-breather and nasal cannula and improved to oxygen 100%. Patient is admitted to inpatient for further intervention treatment. GI was consulted by EDP, with in the morning.  Hospital Course:   Abdominal pain and sepsis with ascending cholangitis - CT-abd/pelvis showed periportal edema, diffuse pneumobilia, dlatation of the common bile duct to 1.9 cm in diameter, indicating common duct obstruction in the biliary infection - Of note, she was hospitalized in February 2017 with similar problem requiring ERCP at that time as well and had gram-negative bacteremia on cultures for [facility], was susceptible to Augmentin, Cipro and Zosyn. - Patient underwent an ERCP on 4/10 with removal of debris/pus but without any obvious stones - Initially she wasn't Zosyn, switched to Augmentin on discharge to complete 2 weeks of antibiotics. - Increased Actigall to 300 mg twice a day.  Atrial fibrillation with rapid ventricular response -Developed A. fib with RVR at heart rate reached 153 (new onset). Cardiology consulted. -Rate required Cardizem drip, switched to oral Cardizem and amiodarone and continued metoprolol at 75 mg. -CHA2DS2-VASc is at least 6, started on Xarelto.  Acute hypoxic respiratory failure with chest pain - Patient found to be having difficulties breathing on 03/10/2016, new oxygen requirement, lung exam shows perfuse  wheezing, she appears fluid overloaded. Discontinue IV fluids, IV Lasix. - This is likely secondary to the mild interstitial edema seen on  the CXR. -This is resolved, IV diuresis discontinued, weaned off of oxygen, oxygen sats mid 90s on RA.  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 Magnesium is 2.0. Given 80 mEq prior to discharge check as outpatient.   Procedures:  Stable  Consultations:  None  Discharge Exam: Filed Vitals:   03/14/16 0005 03/14/16 0645  BP: 153/79 149/71  Pulse: 75 72  Temp:  97.9 F (36.6 C)  Resp:  16  General: Alert and awake, oriented x3, not in any acute distress. HEENT: anicteric sclera, pupils reactive to light and accommodation, EOMI CVS: S1-S2 clear, no murmur rubs or gallops Chest: clear to auscultation bilaterally, no wheezing, rales or rhonchi Abdomen: soft nontender, nondistended, normal bowel sounds, no organomegaly Extremities: no cyanosis, clubbing or edema noted bilaterally Neuro: Cranial nerves II-XII intact, no focal neurological deficits  Discharge Instructions   Discharge Instructions    Diet - low sodium heart healthy    Complete by:  As directed      Increase activity slowly    Complete by:  As directed           Current Discharge Medication List    START taking these medications   Details  amiodarone (PACERONE) 400 MG tablet Take 1 tablet (400 mg total) by mouth daily. Qty: 30 tablet, Refills: 0     amoxicillin-clavulanate (AUGMENTIN) 875-125 MG tablet Take 1 tablet by mouth every 12 (twelve) hours. Qty: 20 tablet, Refills: 0    diltiazem (CARDIZEM CD) 240 MG 24 hr capsule Take 1 capsule (240 mg total) by mouth daily. Qty: 30 capsule, Refills: 0    rivaroxaban (XARELTO) 20 MG TABS tablet Take 1 tablet (20 mg total) by mouth daily with supper. Qty: 30 tablet, Refills: 0      CONTINUE these medications which have CHANGED   Details  ursodiol (ACTIGALL) 500 MG tablet Take 1 tablet (500 mg total) by mouth 2 (two) times daily. Qty: 60 tablet, Refills: 5      CONTINUE these medications which have NOT CHANGED   Details  acetaminophen (TYLENOL) 500 MG tablet Take 500 mg by mouth every 6 (six) hours as needed for moderate pain, fever or headache. Pain     ALPRAZolam (XANAX) 0.5 MG tablet Take 1 tablet (0.5 mg total) by mouth 2 (two) times daily. Qty: 60 tablet, Refills: 1    aspirin 81 MG tablet Take 81 mg by mouth daily as needed for pain.     dicyclomine (BENTYL) 20 MG tablet Take 0.5-1 tablets (10-20 mg total) by mouth 2 (two) times daily as needed for spasms. Qty: 30 tablet, Refills: 3    lisinopril (PRINIVIL,ZESTRIL) 40 MG tablet TAKE 1 TABLET (40 MG TOTAL) BY  MOUTH DAILY. Qty: 90 tablet, Refills: 1    metoprolol (LOPRESSOR) 50 MG tablet Take 1.5 tablets (75 mg total) by mouth 2 (two) times daily. Qty: 270 tablet, Refills: 1    psyllium (METAMUCIL) 58.6 % packet Take 1 packet by mouth daily.    traMADol (ULTRAM) 50 MG tablet TAKE 1 TABLET BY MOUTH EVERY 8 HOURS AS NEEDED FOR PAIN Qty: 60 tablet, Refills: 1    polyethylene glycol powder (GLYCOLAX/MIRALAX) powder Take 1-2 capfuls daily in water Qty: 255 g, Refills: PRN       Allergies  Allergen Reactions  . Hyoscyamine Other (See Comments)    Mental status changes.  (as of 02/13/16 patient denies any problems with this medication)  . Statins     Muscle aches  . Rofecoxib Rash   Follow-up Information    Follow up  with Doran Stabler, MD On 04/21/2016.   Specialty:  Gastroenterology   Why:  11:00 am   Contact information:   [address] [place] [address] [phone]       Follow up with Dorris Carnes, MD In 2 weeks.   Specialty:  Cardiology   Contact information:   [place] [address] [phone]        The results of significant diagnostics from this hospitalization (including imaging, microbiology, ancillary and laboratory) are listed below for reference.    Significant Diagnostic Studies: Ct Chest W Contrast  03/08/2016  CLINICAL DATA:  Mild dyspnea with upper abdominal pain. EXAM: CT CHEST WITH CONTRAST TECHNIQUE: Multidetector CT imaging of the chest was performed during intravenous contrast administration. CONTRAST:  29m ISOVUE-300 IOPAMIDOL (ISOVUE-300) INJECTION 61% COMPARISON:  08/15/2015 FINDINGS: There is stable postsurgical irregularity on the right. There is a noncalcified 5 x 6 mm nodule in the left lower lobe posterior base. This has been present over the past several examinations dating back to at least 02/17/2010, when it measured 5.0 mm. No suspicious nodules are evident. There is prominent interlobular septal thickening throughout both lungs. This is new from 08/15/2015 and hit likely represents interstitial fluid. The vasculature also appears moderately prominent. This may represent congestive heart failure. No frank alveolar edema. No effusions. There is mild unchanged pleural thickening of the right posterior hemi thorax. There is no pathologic adenopathy.  Airways are patent. Upper abdomen is notable for pneumobilia, unchanged. There is unchanged dilatation of the thoracic aorta, measuring 4.4 cm in the ascending aorta and 3.8 cm in the descending. No dissection or other acute vascular abnormality. IMPRESSION: 1. Interlobular septal thickening throughout both lungs which is new, likely due to interstitial fluid. This could represent  CHF as the vasculature also appears moderately prominent. 2. Stable left lower lobe posterior base nodule. No suspicious nodules. 3. Unchanged dilatation of the thoracic aorta. Electronically Signed   By: DAndreas NewportM.D.   On: 03/08/2016 21:44   Ct Abdomen Pelvis W Contrast  03/08/2016  CLINICAL DATA:  Acute onset of mid abdominal pain. Initial encounter. EXAM: CT ABDOMEN AND PELVIS WITH CONTRAST TECHNIQUE: Multidetector CT imaging of the abdomen and pelvis was performed using the standard protocol following bolus administration of intravenous contrast. CONTRAST:  821mISOVUE-300 IOPAMIDOL (ISOVUE-300) INJECTION 61% COMPARISON:  CTA of the chest, abdomen and pelvis performed 08/15/2015 FINDINGS: Bibasilar scarring is noted, with trace right-sided pleural fluid. A 7 mm nodule is noted at the left lung base. Periportal edema is noted. Diffuse pneumobilia is seen. There is dilatation of the  common bile duct to 1.9 cm in diameter. Would correlate for any evidence of postcholecystectomy syndrome. The liver is otherwise grossly unremarkable. The spleen is unremarkable in appearance. The patient is status post cholecystectomy. The pancreas and adrenal glands are grossly unremarkable. The kidneys are unremarkable in appearance. There is no evidence of hydronephrosis. No renal or ureteral stones are seen. No perinephric stranding is appreciated. No free fluid is identified. The small bowel is unremarkable in appearance. The stomach is within normal limits. No acute vascular abnormalities are seen. There is mild aneurysmal dilatation of the infrarenal abdominal aorta to 3.4 cm in transverse dimension. Scattered calcification is noted along the abdominal aorta and its branches. The patient is status post appendectomy. Scattered diverticulosis is noted along the transverse, descending and sigmoid colon, without evidence of diverticulitis. The bladder is moderately distended and partially filled with contrast. The  uterus is grossly unremarkable. Scattered prominent periuterine vessels could reflect pelvic congestion syndrome in the appropriate clinical situation. The ovaries are relatively symmetric. No inguinal lymphadenopathy is seen. No acute osseous abnormalities are identified. IMPRESSION: 1. Periportal edema noted. Diffuse pneumobilia seen. Dilatation of the common bile duct to 1.9 cm in diameter. Would correlate for any evidence of postcholecystectomy syndrome. 2. Mild aneurysmal dilatation of the infrarenal abdominal aorta to 3.4 cm in transverse dimension. Scattered calcification along the abdominal aorta and its branches. Recommend followup by ultrasound in 3 years. This recommendation follows ACR consensus guidelines: White Paper of the ACR Incidental Findings Committee II on Vascular Findings. J Am Coll Radiol 2013; 26:948-546 3. Scattered prominent periuterine vessels could reflect pelvic congestion syndrome in the appropriate clinical situation. 4. Bibasilar scarring, with trace right-sided pleural fluid. 7 mm nodule at the left lung base. It is difficult to determine whether this has changed in size since the prior study. Non-contrast chest CT at 6-12 months is recommended. If the nodule is stable at time of repeat CT, then future CT at 18-24 months (from today's scan) is considered optional for low-risk patients, but is recommended for high-risk patients. This recommendation follows the consensus statement: Guidelines for Management of Incidental Pulmonary Nodules Detected on CT Images:From the Fleischner Society 2017; published online before print (10.1148/radiol.2703500938). 5. Scattered diverticulosis along the transverse, descending and sigmoid colon, without evidence of diverticulitis. Electronically Signed   By: Garald Balding M.D.   On: 03/08/2016 23:46   Dg Chest Port 1 View  03/10/2016  CLINICAL DATA:  Shortness of breath and wheezing, history of COPD, former smoker, coronary artery disease,  valvular heart disease EXAM: PORTABLE CHEST 1 VIEW COMPARISON:  CT scan of the chest of March 08, 2016 FINDINGS: The lungs remain hyperinflated. The interstitial markings are diffusely increased and more conspicuous than on the earlier CT scan. Areas of patchy alveolar opacity are noted in the right upper and lower lobes. There is pleural thickening or pleural fluid along the right lateral thoracic wall. The left lung exhibits no alveolar infiltrate but the interstitial markings are increased diffusely. The cardiac silhouette is top-normal in size. There is mild central pulmonary vascular prominence. The patient has undergone previous median sternotomy. IMPRESSION: COPD with superimposed patchy interstitial and alveolar pneumonia in the right lung with pleural fluid lying along the convexity of the right lung. Chronic underlying interstitial disease with increased conspicuity due to mild pulmonary interstitial edema. Electronically Signed   By: David  Martinique M.D.   On: 03/10/2016 07:52   Dg Ercp  03/09/2016  CLINICAL DATA:  Diffuse biliary dilatation the common cholangitis  EXAM: ERCP with sphincterotomy and balloon sweep TECHNIQUE: Multiple spot images obtained with the fluoroscopic device and submitted for interpretation post-procedure. FLUOROSCOPY TIME:  Number of Acquired Images:  10 COMPARISON:  03/08/2016 FINDINGS: Limited imaging during the ERCP procedure. There is diffuse severe dilatation of the common bile duct. Intrahepatic biliary dilatation noted. There is also pneumobilia. Balloon sweep performed of the dilated duct system. Access removed. No stent visualized or inserted. IMPRESSION: Limited imaging during the ERCP with balloon sweep and sphincterotomy. Diffuse biliary dilatation noted. Please refer to the ERCP report for further details. These images were submitted for radiologic interpretation only. Please see the procedural report for the amount of contrast and the fluoroscopy time utilized.  Electronically Signed   By: Jerilynn Mages.  Shick M.D.   On: 03/09/2016 15:57    Microbiology: Recent Results (from the past 240 hour(s))  Culture, blood (x 2)     Status: None (Preliminary result)   Collection Time: 03/09/16  2:20 AM  Result Value Ref Range Status   Specimen Description BLOOD LEFT ARM  Final   Special Requests BOTTLES DRAWN AEROBIC AND ANAEROBIC 5CC  Final   Culture   Final    NO GROWTH 4 DAYS Performed at [facility]    Report Status PENDING  Incomplete  Culture, blood (x 2)     Status: None (Preliminary result)   Collection Time: 03/09/16  2:25 AM  Result Value Ref Range Status   Specimen Description BLOOD BLOOD LEFT HAND  Final   Special Requests BOTTLES DRAWN AEROBIC AND ANAEROBIC 5CC  Final   Culture   Final    NO GROWTH 4 DAYS Performed at [facility]    Report Status PENDING  Incomplete  MRSA PCR Screening     Status: None   Collection Time: 03/10/16  4:45 PM  Result Value Ref Range Status   MRSA by PCR NEGATIVE NEGATIVE Final    Comment:        The GeneXpert MRSA Assay (FDA approved for NASAL specimens only), is one component of a comprehensive MRSA colonization surveillance program. It is not intended to diagnose MRSA infection nor to guide or monitor treatment for MRSA infections.      Labs: Basic Metabolic Panel:  Recent Labs Lab 03/10/16 0533 03/11/16 0055 03/12/16 0328 03/13/16 0323 03/14/16 0434  NA 135 135 137 139 139  K 4.2 3.9 3.1* 3.4* 3.3*  CL 105 100* 95* 100* 102  CO2 25 30 33* 31 28  GLUCOSE 122* 111* 109* 115* 107*  BUN '11 8 7 7 7  '$ CREATININE 0.64 0.67 0.68 0.71 0.57  CALCIUM 8.2* 8.3* 8.5* 8.8* 9.0  MG  --   --   --  2.0  --    Liver Function Tests:  Recent Labs Lab 03/08/16 2003 03/09/16 0213 03/09/16 0510 03/10/16 0533 03/11/16 0055  AST 323* 229* 176* 63* 37  ALT 234* 243* 209* 119* 92*  ALKPHOS 123 118 106 94 103  BILITOT 1.8* 2.9* 3.0* 2.6* 2.1*  PROT 7.0 6.0* 5.4* 5.1* 5.6*  ALBUMIN 3.9  3.2* 2.9* 2.6* 2.8*    Recent Labs Lab 03/08/16 2003 03/09/16 0505  LIPASE 21 22   No results for input(s): AMMONIA in the last 168 hours. CBC:  Recent Labs Lab 03/08/16 2003 03/09/16 0510 03/10/16 0533 03/11/16 0055  WBC 15.8* 27.1* 16.3* 14.8*  HGB 14.6 12.1 11.1* 11.8*  HCT 42.9 36.6 33.5* 36.2  MCV 84.8 86.5 87.0 86.6  PLT 249 207 168 204  Cardiac Enzymes:  Recent Labs Lab 03/08/16 2159 03/10/16 0859 03/10/16 1424 03/10/16 1823 03/11/16 0055  TROPONINI <0.03 0.04* 0.04* 0.04* 0.03   BNP: BNP (last 3 results)  Recent Labs  03/08/16 2159  BNP 115.5*    ProBNP (last 3 results) No results for input(s): PROBNP in the last 8760 hours.  CBG:  Recent Labs Lab 03/10/16 0812 03/11/16 0903 03/12/16 0807 03/13/16 0810 03/14/16 0737  GLUCAP 123* 95 104* 105* 99       Signed:  Khai Arrona A MD.  [facility] 03/14/2016, 10:46 AM

## 2016-03-14 NOTE — Progress Notes (Addendum)
   Subjective: Pt sleeping comfortably  Does not appear t obw in any discomfort   Objective: Filed Vitals:   03/13/16 2046 03/13/16 2250 03/14/16 0005 03/14/16 0645  BP: 162/75 174/94 153/79 149/71  Pulse: 84 88 75 72  Temp: 97.7 F (36.5 C)   97.9 F (36.6 C)  TempSrc: Oral   Oral  Resp: 18   16  Height:      Weight:    159 lb 14.4 oz (72.53 kg)  SpO2: 93%   93%   Weight change: 10 lb 6.9 oz (4.73 kg) No intake or output data in the 24 hours ending 03/14/16 0717  General: Alert, awake, oriented x3, in no acute distress Neck:  JVP is normal Heart: Regular rate and rhythm, without murmurs, rubs, gallops.  Lungs: Clear to auscultation.  No rales or wheezes. Exemities:  No edema.   Neuro: Grossly intact, nonfocal. Tele:  SR    Lab Results: Results for orders placed or performed during the hospital encounter of 03/08/16 (from the past 24 hour(s))  Glucose, capillary     Status: Abnormal   Collection Time: 03/13/16  8:10 AM  Result Value Ref Range   Glucose-Capillary 105 (H) 65 - 99 mg/dL  Basic metabolic panel     Status: Abnormal   Collection Time: 03/14/16  4:34 AM  Result Value Ref Range   Sodium 139 135 - 145 mmol/L   Potassium 3.3 (L) 3.5 - 5.1 mmol/L   Chloride 102 101 - 111 mmol/L   CO2 28 22 - 32 mmol/L   Glucose, Bld 107 (H) 65 - 99 mg/dL   BUN 7 6 - 20 mg/dL   Creatinine, Ser 0.57 0.44 - 1.00 mg/dL   Calcium 9.0 8.9 - 10.3 mg/dL   GFR calc non Af Amer >60 >60 mL/min   GFR calc Af Amer >60 >60 mL/min   Anion gap 9 5 - 15    Studies/Results: No results found.  Medications:Reviewed   '@PROBHOSP'$ @  1  Acute on chronic diastolic CHF  Volume status is OK  Replete KCL x 1     2  Atrial fib   Currently on Dilt, lopressor, amio 400 qd  and Xarelto  Remains in SR  OK to d/c  WIll make sure she has outpt f/u    3  HTN  BP mildly increased  I would increase lisinopril to 40  Folow as outpt    4.  AS  Sp AVR    5  Vasc ddz.  Follow up dopplers in dec 2017     Hx AAA  Follow as outpt. CAD at cath mild  I would stop ASA now that she is on Xarelto    6  HL INtolerant to statins  Will need to be followed as oupt    Will make sure that pt has outpt f/u    LOS: 5 days   Dorris Carnes 03/14/2016, 7:17 AM

## 2016-03-14 NOTE — Care Management Note (Signed)
Case Management Note  Patient Details  Name: Laurie [name] MRN: [id number] Date of Birth: 1934/12/09  Subjective/Objective:                   Abdominal pain and sepsis with ascending cholangitis/ Atrial fibrillation with rapid ventricular response  Action/Plan:  D/c home  Expected Discharge Date:  03/14/16               Expected Discharge Plan:  Home/Self Care  In-House Referral:  NA  Discharge planning Services  CM Consult  Post Acute Care Choice:  NA Choice offered to:  NA  DME Arranged:    DME Agency:     HH Arranged:  NA HH Agency:     Status of Service:  Completed, signed off  Medicare Important Message Given:    Date Medicare IM Given:    Medicare IM give by:    Date Additional Medicare IM Given:    Additional Medicare Important Message give by:     If discussed at [place] of Stay Meetings, dates discussed:    Additional Comments: Patient developed Atrial Fib w RVR in house started on Xarelto, contacted pharmacy who have consulted/educated the patient on her new medication and was supplying the home kit and a Xarelto 30 day free card.  No CM needs.  Corine Shelter, RN 03/14/2016, 3:16 PM

## 2016-03-16 ENCOUNTER — Telehealth: Payer: Self-pay | Admitting: Cardiology

## 2016-03-16 NOTE — Telephone Encounter (Signed)
Spoke with pt, she is concerned about all the meds she was given at discharge for atrial fib. Explained in detail why each medication is important. She has not gotten any of the meds from the hosp filled. She does not want to take the blood thinner. Explained to pt the risk of stroke related to the atrial fib. She voiced understanding and follow up made for pt to see bc on Wednesday this week.

## 2016-03-16 NOTE — Telephone Encounter (Signed)
Pt needs to ask about medS she got at the hospital-has [phone]

## 2016-03-17 NOTE — Progress Notes (Signed)
HPI: FU atrial fibrillation, CAD, ho aortic stenosis status post aortic valve replacement in September 2009. She had a pericardial tissue valve. She also had a septal myectomy and a right middle lobectomy due to lung CA. Note, she had a 50% mid LAD by catheterization preoperatively. Followup carotid Dopplers in Dec 2016 revealed moderate bilateral plaque with a right approaching 50%. Echocardiogram April 2017 showed normal LV function, grade 2 diastolic dysfunction, prior aortic valve replacement with mean gradient 6 mmHg. Abdominal CT April 2017 showed abdominal aortic aneurysm of 3.4 cm. Chest CT April 2017 showed stable left lower lobe posterior based nodule. Thoracic aorta measured 4.4 cm. Admitted in April 2017 with abdominal pain. Had ERCP (debris, sludge, pus removed) and was treated with antibiotics. She developed atrial fibrillation which was new. She was treated with Cardizem, amiodarone and metoprolol and also initiated on anticoagulation. Since I last saw her, She denies dyspnea, chest pain, Palpitations or syncope. No pedal edema. She has mild dizziness with standing.   Current Outpatient Prescriptions  Medication Sig Dispense Refill  . acetaminophen (TYLENOL) 500 MG tablet Take 500 mg by mouth every 6 (six) hours as needed for moderate pain, fever or headache. Pain     . ALPRAZolam (XANAX) 0.5 MG tablet Take 1 tablet (0.5 mg total) by mouth 2 (two) times daily. (Patient taking differently: Take 0.5 mg by mouth 2 (two) times daily as needed. ) 60 tablet 1  . amiodarone (PACERONE) 400 MG tablet Take 1 tablet (400 mg total) by mouth daily. 30 tablet 0  . amoxicillin-clavulanate (AUGMENTIN) 875-125 MG tablet Take 1 tablet by mouth every 12 (twelve) hours. 20 tablet 0  . aspirin 81 MG tablet Take 81 mg by mouth daily as needed for pain.     Marland Kitchen dicyclomine (BENTYL) 20 MG tablet Take 0.5-1 tablets (10-20 mg total) by mouth 2 (two) times daily as needed for spasms. 30 tablet 3  . diltiazem  (CARDIZEM CD) 240 MG 24 hr capsule Take 1 capsule (240 mg total) by mouth daily. 30 capsule 0  . lisinopril (PRINIVIL,ZESTRIL) 40 MG tablet TAKE 1 TABLET (40 MG TOTAL) BY MOUTH DAILY. 90 tablet 1  . metoprolol (LOPRESSOR) 50 MG tablet Take 1.5 tablets (75 mg total) by mouth 2 (two) times daily. 270 tablet 1  . polyethylene glycol powder (GLYCOLAX/MIRALAX) powder Take 1-2 capfuls daily in water 255 g PRN  . psyllium (METAMUCIL) 58.6 % packet Take 1 packet by mouth daily.    . rivaroxaban (XARELTO) 20 MG TABS tablet Take 1 tablet (20 mg total) by mouth daily with supper. 30 tablet 0  . traMADol (ULTRAM) 50 MG tablet TAKE 1 TABLET BY MOUTH EVERY 8 HOURS AS NEEDED FOR PAIN (Patient taking differently: TAKE 50 MG BY MOUTH EVERY 8 HOURS AS NEEDED FOR PAIN) 60 tablet 1  . ursodiol (ACTIGALL) 500 MG tablet Take 1 tablet (500 mg total) by mouth 2 (two) times daily. 60 tablet 5   No current facility-administered medications for this visit.     Past Medical History  Diagnosis Date  . Anemia     iron deficiency  . Shingles 11-09  . COPD (chronic obstructive pulmonary disease) ([place]) 2005    on CXR with R apical scaring  . Atrial fibrillation ([place])     post op  . Hypertension   . Cerebrovascular disease   . S/P aortic valve replacement 08/28/2008    [facility]  . S/P lobectomy of lung 08/28/2008    rt  middle lobe  dr. Roxy Manns  . AAA (abdominal aortic aneurysm) ([place])   . CAD (coronary artery disease)   . Hypercholesterolemia     Hx: of  . Headache(784.0)     Hx: of migraines in the past  . Carcinoma of lung ([place]) 08/28/2008    T2N0 moderately diff. squamous cell CA  . Ascending aorta dilatation [facility])     Past Surgical History  Procedure Laterality Date  . Tonsillectomy and adenoidectomy    . U/s guided aspiration of r breast cyst   2009    at the breast clinic  . Rml removed dr Ricard Dillon for lung cancer  08/28/2008    T2N0 squamous cell CA  . Abdominal surgery    . Aortic valve replacement   08[facility]28/2008    [facility]  . Colonoscopy      \  . Cataract extraction w/ intraocular lens  implant, bilateral Bilateral   . Dilation and curettage of uterus    . Cardiac catheterization    . Appendectomy    . Cholecystectomy N/A 12/13/2013    Procedure: LAPAROSCOPIC CHOLECYSTECTOMY;  Surgeon: ARalene Ok MD;  [facility]  Service: General;  Laterality: N/A;  . Ercp N/A 05/08/2015    Procedure: ENDOSCOPIC RETROGRADE CHOLANGIOPANCREATOGRAPHY (ERCP);  Surgeon: RInda Castle MD;  [facility]  Service: Endoscopy;  Laterality: N/A;  . Ercp N/A 01/13/2016    Procedure: ENDOSCOPIC RETROGRADE CHOLANGIOPANCREATOGRAPHY (ERCP);  Surgeon: HDoran Stabler MD;  [facility];  Service: Endoscopy;  Laterality: N/A;  . Ercp N/A 03/09/2016    Procedure: ENDOSCOPIC RETROGRADE CHOLANGIOPANCREATOGRAPHY (ERCP);  Surgeon: MLadene Artist MD;  [facility];  Service: Endoscopy;  Laterality: N/A;    Social History   Social History  . Marital Status: Single    Spouse Name: N/A  . Number of Children: N/A  . Years of Education: N/A   Occupational History  . Not on file.   Social History Main Topics  . Smoking status: Former Smoker -- 40 years  . Smokeless tobacco: Never Used  . Alcohol Use: No  . Drug Use: No  . Sexual Activity: Not on file   Other Topics Concern  . Not on file   Social History Narrative    Family History  Problem Relation Age of Onset  . Stroke Father   . Diabetes Son     brittle diabetes  . Colon polyps Neg Hx   . Colon cancer Neg Hx   . Esophageal cancer Neg Hx   . Stomach cancer Neg Hx   . Pancreatic cancer Neg Hx   . Kidney disease Neg Hx   . Liver disease Neg Hx     ROS: Some residualAbdominal soreness but no fevers or chills, productive cough, hemoptysis, dysphasia, odynophagia, melena, hematochezia, dysuria, hematuria, rash, seizure activity, orthopnea, PND, pedal edema,  claudication. Remaining systems are negative.  Physical Exam: Well-developed well-nourished in no acute distress.  Skin is warm and dry.  HEENT is normal.  Neck is supple.  Chest is clear to auscultation with normal expansion.  Cardiovascular exam is regular rate and rhythm. 2/6 systolic murmur left sternal border. No diastolic murmur. Abdominal exam nontender or distended. No masses palpated. Extremities show no edema. neuro grossly intact

## 2016-03-18 ENCOUNTER — Encounter: Payer: Self-pay | Admitting: Cardiology

## 2016-03-18 ENCOUNTER — Ambulatory Visit (INDEPENDENT_AMBULATORY_CARE_PROVIDER_SITE_OTHER): Payer: Commercial Managed Care - HMO | Admitting: Cardiology

## 2016-03-18 VITALS — BP 122/64 | HR 60 | Ht 67.0 in | Wt 159.0 lb

## 2016-03-18 DIAGNOSIS — I77810 Thoracic aortic ectasia: Secondary | ICD-10-CM

## 2016-03-18 DIAGNOSIS — I779 Disorder of arteries and arterioles, unspecified: Secondary | ICD-10-CM

## 2016-03-18 DIAGNOSIS — E785 Hyperlipidemia, unspecified: Secondary | ICD-10-CM | POA: Diagnosis not present

## 2016-03-18 DIAGNOSIS — Z954 Presence of other heart-valve replacement: Secondary | ICD-10-CM | POA: Diagnosis not present

## 2016-03-18 DIAGNOSIS — I4891 Unspecified atrial fibrillation: Secondary | ICD-10-CM

## 2016-03-18 DIAGNOSIS — Z952 Presence of prosthetic heart valve: Secondary | ICD-10-CM

## 2016-03-18 DIAGNOSIS — I10 Essential (primary) hypertension: Secondary | ICD-10-CM | POA: Diagnosis not present

## 2016-03-18 DIAGNOSIS — I723 Aneurysm of iliac artery: Secondary | ICD-10-CM

## 2016-03-18 DIAGNOSIS — I2510 Atherosclerotic heart disease of native coronary artery without angina pectoris: Secondary | ICD-10-CM

## 2016-03-18 DIAGNOSIS — I739 Peripheral vascular disease, unspecified: Secondary | ICD-10-CM

## 2016-03-18 NOTE — Assessment & Plan Note (Signed)
Discontinue aspirin given need for anticoagulation.No statin as she is intolerant.

## 2016-03-18 NOTE — Assessment & Plan Note (Signed)
Plan repeat CTA for thoracic and abdominal aorta in April 2017.

## 2016-03-18 NOTE — Assessment & Plan Note (Signed)
Follow-up CT April 2018.

## 2016-03-18 NOTE — Assessment & Plan Note (Signed)
Follow-up carotid Dopplers December 2017.

## 2016-03-18 NOTE — Assessment & Plan Note (Signed)
Patient remains in sinus rhythm. I think her recent bout of atrial fibrillation was likely due to the stress of her gallbladder process. We will discontinue amiodarone. If she has recurrences we will resume. Continue xarelto. Check hemoglobin and renal function when she returns in 8 weeks.

## 2016-03-18 NOTE — Patient Instructions (Addendum)
Medication Instructions:   STOP AMIODARONE  STOP ASPIRIN  Follow-Up:  Your physician recommends that you schedule a follow-up appointment in: [place]

## 2016-03-18 NOTE — Assessment & Plan Note (Signed)
Blood pressure controlled. Continue present medications. 

## 2016-03-18 NOTE — Assessment & Plan Note (Signed)
Continue diet. Intolerant to statins. 

## 2016-03-18 NOTE — Assessment & Plan Note (Signed)
Continue SBE prophylaxis. 

## 2016-03-25 ENCOUNTER — Telehealth: Payer: Self-pay | Admitting: Gastroenterology

## 2016-03-25 NOTE — Telephone Encounter (Signed)
The pt is complaining of a "bad" feeling when taking ursidol.  She is on 500 mg BID.  She is stating that she can tolerate it once daily.  She also has soreness and redness on her tongue.  She is taking augmentin 875/125 mg 1 q 12 hours for 10 days and has 2 days left of the prescription.  Please advise.

## 2016-03-26 NOTE — Telephone Encounter (Signed)
She can certainly decrease it to once daily if that is all she feels she can tolerate.  If once a day still gives her trouble, just stop it all together.

## 2016-03-26 NOTE — Telephone Encounter (Signed)
Pt has been notified that she can decrease to once daily.  She will stop if this is still to much.  She will keep appt as scheduled.

## 2016-03-31 ENCOUNTER — Telehealth: Payer: Self-pay | Admitting: Cardiology

## 2016-03-31 MED ORDER — DILTIAZEM HCL ER COATED BEADS 180 MG PO CP24: 180.0000 mg | ORAL_CAPSULE | Freq: Every day | ORAL | Status: DC

## 2016-03-31 NOTE — Telephone Encounter (Signed)
Dose change recommendations given, pt voiced understanding. She will pick up at pharmacy today. Aware to give this several days for symptom improvement.

## 2016-03-31 NOTE — Telephone Encounter (Signed)
Randall Hiss (son) is calling because Laurie [name] has been complaining about Dizziness , and her heart rate is low . Please call   Thanks

## 2016-03-31 NOTE — Telephone Encounter (Signed)
Follow UP   Pt is returning the call

## 2016-03-31 NOTE — Telephone Encounter (Signed)
Cut diltiazem dose back to 180 mg daily, will have to send in new Rx, as it only comes in capsule form.  Have her give that about a week to see if symptoms improve

## 2016-03-31 NOTE — Telephone Encounter (Signed)
Patient had recent med changes (saw Dr. Stanford Breed in [place] on 4/19) She is on diltiazem ER '240mg'$  daily (Cartia XT), metoprolol tart '75mg'$  BID - diltiazem dosing is relatively recent  Pt went in last month to hospital & had cholecystectomy. She is also on Actigall for gallstone formation prevention.  Notes that typically in AM, she feels very tired/lightheaded, notes that when PT or HHRN check HR in AM it is low - the other day was 46. She states that the lightheadedness typically resolves early afternoon. Pt thinks she is either getting too much medication or getting dosed differently than she should.  Informed pt I would be glad to seek advice on this - will inquire w/ PharmD.

## 2016-04-06 ENCOUNTER — Encounter: Payer: Self-pay | Admitting: Cardiology

## 2016-04-06 NOTE — Telephone Encounter (Signed)
Follow Up ° °Pt returned call//  °

## 2016-04-06 NOTE — Telephone Encounter (Signed)
Left message for pt to call, I have not called today.

## 2016-04-08 ENCOUNTER — Telehealth: Payer: Self-pay | Admitting: Cardiology

## 2016-04-08 DIAGNOSIS — I4891 Unspecified atrial fibrillation: Secondary | ICD-10-CM

## 2016-04-08 NOTE — Telephone Encounter (Signed)
New message   Pt is calling because she want to speak to the rn about her medication  Pt verbalized that she wants to explain it to the rn  i asked her if it gave her SOB/or any physical issues she said she wants the rn to call her

## 2016-04-08 NOTE — Telephone Encounter (Signed)
F/u   Pt6 returnng phone call.Please call back and discuss.

## 2016-04-08 NOTE — Telephone Encounter (Signed)
LMTCB

## 2016-04-08 NOTE — Telephone Encounter (Signed)
This encounter was created in error - please disregard.

## 2016-04-09 DIAGNOSIS — I4891 Unspecified atrial fibrillation: Secondary | ICD-10-CM | POA: Diagnosis not present

## 2016-04-09 NOTE — Telephone Encounter (Signed)
Spoke with pt, Aware of dr Jacalyn Lefevre recommendations.  She did not want to come to [place], follow up made for [place] at next available. Patient voiced understanding to not stop the xarelto before talking to [place].

## 2016-04-09 NOTE — Telephone Encounter (Signed)
No answer. Left message to call back.   

## 2016-04-09 NOTE — Telephone Encounter (Signed)
Incoming call received from pt. Pt c/o the following symptoms gradually becoming worse over the past 2 weeks since she started taking Xarelto: Headache, decreased appetite, sweating, stomach soreness and feeling swollen. She also says she usually voids more urine than what she is currently. She denies swelling in her lower extremities. Denies SOB, CP or palpitations. She denies black, tarry stools or blood visibly in stool or urine. She's concerned because she has had aortic valve replacement in 2009.  Please advise. Will route to Dr Stanford Breed and Fredia Beets.  Pt would like [place] to call her back at [phone]. Her normal phone line is having problems.

## 2016-04-09 NOTE — Telephone Encounter (Signed)
Check bmet and CBC. PAOV. May need to change to coumadin if pt willing [name]

## 2016-04-09 NOTE — Telephone Encounter (Signed)
FOLLOW UP   Pt is returning call for rn

## 2016-04-10 LAB — CBC
HEMATOCRIT: 43.2 % (ref 35.0–45.0)
HEMOGLOBIN: 15 g/dL (ref 11.7–15.5)
MCH: 29.6 pg (ref 27.0–33.0)
MCHC: 34.7 g/dL (ref 32.0–36.0)
MCV: 85.2 fL (ref 80.0–100.0)
MPV: 10.4 fL (ref 7.5–12.5)
Platelets: 276 10*3/uL (ref 140–400)
RBC: 5.07 MIL/uL (ref 3.80–5.10)
RDW: 13.9 % (ref 11.0–15.0)
WBC: 9.1 10*3/uL (ref 3.8–10.8)

## 2016-04-11 LAB — BASIC METABOLIC PANEL
BUN: 11 mg/dL (ref 7–25)
CALCIUM: 9.2 mg/dL (ref 8.6–10.4)
CO2: 23 mmol/L (ref 20–31)
CREATININE: 0.76 mg/dL (ref 0.60–0.93)
Chloride: 94 mmol/L — ABNORMAL LOW (ref 98–110)
GLUCOSE: 94 mg/dL (ref 65–99)
Potassium: 4.3 mmol/L (ref 3.5–5.3)
SODIUM: 132 mmol/L — AB (ref 135–146)

## 2016-04-21 ENCOUNTER — Encounter: Payer: Self-pay | Admitting: Gastroenterology

## 2016-04-21 ENCOUNTER — Ambulatory Visit (INDEPENDENT_AMBULATORY_CARE_PROVIDER_SITE_OTHER): Payer: Commercial Managed Care - HMO | Admitting: Gastroenterology

## 2016-04-21 ENCOUNTER — Telehealth: Payer: Self-pay | Admitting: Gastroenterology

## 2016-04-21 VITALS — BP 100/68 | HR 55 | Ht 67.0 in | Wt 165.0 lb

## 2016-04-21 DIAGNOSIS — K8050 Calculus of bile duct without cholangitis or cholecystitis without obstruction: Secondary | ICD-10-CM

## 2016-04-21 MED ORDER — TRAMADOL HCL 50 MG PO TABS: 50.0000 mg | ORAL_TABLET | Freq: Three times a day (TID) | ORAL | Status: DC | PRN

## 2016-04-21 NOTE — Progress Notes (Signed)
HPI: FU atrial fibrillation, CAD, ho aortic stenosis status post aortic valve replacement in September 2009. She had a pericardial tissue valve. She also had a septal myectomy and a right middle lobectomy due to lung CA. Note, she had a 50% mid LAD by catheterization preoperatively. Followup carotid Dopplers in Dec 2016 revealed moderate bilateral plaque with a right approaching 50%. Echocardiogram April 2017 showed normal LV function, grade 2 diastolic dysfunction, prior aortic valve replacement with mean gradient 6 mmHg. Abdominal CT April 2017 showed abdominal aortic aneurysm of 3.4 cm. Chest CT April 2017 showed stable left lower lobe posterior based nodule. Thoracic aorta measured 4.4 cm. Admitted in April 2017 with abdominal pain. Had ERCP (debris, sludge, pus removed) and was treated with antibiotics. She developed atrial fibrillation which was new. Since I last saw her, She denies dyspnea, chest pain, palpitations or syncope. No pedal edema. No bleeding.  Current Outpatient Prescriptions  Medication Sig Dispense Refill  . acetaminophen (TYLENOL) 500 MG tablet Take 500 mg by mouth every 6 (six) hours as needed for moderate pain, fever or headache. Pain     . ALPRAZolam (XANAX) 0.5 MG tablet Take 1 tablet (0.5 mg total) by mouth 2 (two) times daily. (Patient taking differently: Take 0.5 mg by mouth 2 (two) times daily as needed. ) 60 tablet 1  . dicyclomine (BENTYL) 20 MG tablet Take 0.5-1 tablets (10-20 mg total) by mouth 2 (two) times daily as needed for spasms. 30 tablet 3  . diltiazem (CARDIZEM CD) 180 MG 24 hr capsule Take 1 capsule (180 mg total) by mouth daily. 30 capsule 3  . lisinopril (PRINIVIL,ZESTRIL) 40 MG tablet TAKE 1 TABLET (40 MG TOTAL) BY MOUTH DAILY. 90 tablet 1  . metoprolol (LOPRESSOR) 50 MG tablet Take 1.5 tablets (75 mg total) by mouth 2 (two) times daily. 270 tablet 1  . rivaroxaban (XARELTO) 20 MG TABS tablet Take 1 tablet (20 mg total) by mouth daily with supper.  30 tablet 0  . traMADol (ULTRAM) 50 MG tablet Take 1 tablet (50 mg total) by mouth every 8 (eight) hours as needed. for pain 60 tablet 0  . ursodiol (ACTIGALL) 500 MG tablet Take 1 tablet (500 mg total) by mouth 2 (two) times daily. 60 tablet 5   No current facility-administered medications for this visit.     Past Medical History  Diagnosis Date  . Anemia     iron deficiency  . Shingles 11-09  . COPD (chronic obstructive pulmonary disease) ([place]) 2005    on CXR with R apical scaring  . Atrial fibrillation ([place])     post op  . Hypertension   . Cerebrovascular disease   . S/P aortic valve replacement 08/28/2008    [facility]  . S/P lobectomy of lung 08/28/2008    rt middle lobe  dr. Roxy Manns  . AAA (abdominal aortic aneurysm) ([place])   . CAD (coronary artery disease)   . Hypercholesterolemia     Hx: of  . Headache(784.0)     Hx: of migraines in the past  . Carcinoma of lung ([place]) 08/28/2008    T2N0 moderately diff. squamous cell CA  . Ascending aorta dilatation [facility])     Past Surgical History  Procedure Laterality Date  . Tonsillectomy and adenoidectomy    . U/s guided aspiration of r breast cyst   2009    at the breast clinic  . Rml removed dr Ricard Dillon for lung cancer  08/28/2008    T2N0 squamous  cell CA  . Abdominal surgery    . Aortic valve replacement  08/28/2008    [facility]  . Colonoscopy      \  . Cataract extraction w/ intraocular lens  implant, bilateral Bilateral   . Dilation and curettage of uterus    . Cardiac catheterization    . Appendectomy    . Cholecystectomy N/A 12/13/2013    Procedure: LAPAROSCOPIC CHOLECYSTECTOMY;  Surgeon: ARalene Ok MD;  [facility]  Service: General;  Laterality: N/A;  . Ercp N/A 05/08/2015    Procedure: ENDOSCOPIC RETROGRADE CHOLANGIOPANCREATOGRAPHY (ERCP);  Surgeon: RInda Castle MD;  [facility] [facility]  Service: Endoscopy;  Laterality: N/A;  . Ercp N/A 01/13/2016    Procedure:  ENDOSCOPIC RETROGRADE CHOLANGIOPANCREATOGRAPHY (ERCP);  Surgeon: HDoran Stabler MD;  [facility];  Service: Endoscopy;  Laterality: N/A;  . Ercp N/A 03/09/2016    Procedure: ENDOSCOPIC RETROGRADE CHOLANGIOPANCREATOGRAPHY (ERCP);  Surgeon: MLadene Artist MD;  [facility];  Service: Endoscopy;  Laterality: N/A;    Social History   Social History  . Marital Status: Single    Spouse Name: N/A  . Number of Children: N/A  . Years of Education: N/A   Occupational History  . Not on file.   Social History Main Topics  . Smoking status: Former Smoker -- 40 years  . Smokeless tobacco: Never Used  . Alcohol Use: No  . Drug Use: No  . Sexual Activity: Not on file   Other Topics Concern  . Not on file   Social History Narrative    Family History  Problem Relation Age of Onset  . Stroke Father   . Diabetes Son     brittle diabetes  . Colon polyps Neg Hx   . Colon cancer Neg Hx   . Esophageal cancer Neg Hx   . Stomach cancer Neg Hx   . Pancreatic cancer Neg Hx   . Kidney disease Neg Hx   . Liver disease Neg Hx     ROS: Patient with new onset bilateral lower extremity claudication but no fevers or chills, productive cough, hemoptysis, dysphasia, odynophagia, melena, hematochezia, dysuria, hematuria, rash, seizure activity, orthopnea, PND, pedal edema. Remaining systems are negative.  Physical Exam: Well-developed well-nourished in no acute distress.  Skin is warm and dry.  HEENT is normal.  Neck is supple.  Chest is clear to auscultation with normal expansion.  Cardiovascular exam is regular rate and rhythm. 2/6 systolic murmur left sternal border. No diastolic murmur. Abdominal exam nontender or distended. No masses palpated. Extremities show no edema. neuro grossly intact

## 2016-04-21 NOTE — Patient Instructions (Signed)
We have sent the following medications to your pharmacy for you to pick up at your convenience: Tramadol  If you are age 79 or older, your body mass index should be between 23-30. Your Body mass index is 25.84 kg/(m^2). If this is out of the aforementioned range listed, please consider follow up with your Primary Care Provider.  If you are age 80 or younger, your body mass index should be between 19-25. Your Body mass index is 25.84 kg/(m^2). If this is out of the aformentioned range listed, please consider follow up with your Primary Care Provider.   Thank you for choosing [facility]  Dr Wilfrid Lund III

## 2016-04-21 NOTE — Progress Notes (Signed)
[facility] Progress Note  Chief Complaint: Choledocholithiais and cholangitis  Subjective History:  Laurie [name] was seen in clinic follow-up after a recent episode of cholangitis and CBD stones. I met her when this occurred in February of this year, an ERCP required extension of her previous sphincterotomy and removal of multiple stones and pus. She came in with another episode in early April with worsening of her right upper quadrant pain. Dr. Fuller Plan performed a repeat ERCP with a balloon sweep removing stones sludge and pus. She went home on a course of antibiotics. Of note, she has been taking ursodiol 500 mg daily since the February admission. A more frequent dosed and that seems to cause abdominal pain. Should also be noted that she has had chronic right upper quadrant pain dating back to her cholecystectomy. In addition, she has pain in the right buttock and in the right calf with ambulation that seems to be getting worse in the last several months.   ROS: Cardiovascular:  no chest pain Respiratory: no dyspnea  The patient's Past Medical, Family and Social History were reviewed and are on file in the EMR.  Objective:  Med list reviewed  Vital signs in last 24 hrs: Filed Vitals:   04/21/16 1112  BP: 100/68  Pulse: 55    Physical Exam   HEENT: sclera anicteric, oral mucosa moist without lesions  Neck: supple, no thyromegaly, JVD or lymphadenopathy  Cardiac: RRR without murmurs, S1S2 heard, no peripheral edema  Pulm: clear to auscultation bilaterally, normal RR and effort noted  Abdomen: soft, mild RUQ tenderness, with active bowel sounds. No guarding or palpable hepatosplenomegaly.  Skin; warm and dry, no jaundice or rash   Labs, CT scan, and ERCP report from recent hospital admission were reviewed.   '@ASSESSMENTPLANBEGIN' @ Assessment: Encounter Diagnosis  Name Primary?  . Calculus of bile duct without obstruction, cholangitis, or cholecystitis Yes    She has a  dilated CBD with a periampullary diverticulum, all of which makes her a set up for future stone formation. The size of sphincterotomy was limited by the periampullary anatomy. She will most like to get future episodes, and it might be difficult to tell when they occur since she has chronic right upper quadrant pain. Nevertheless, her for LFTs start to rise, and certainly if she has shaking chills with these episodes, she requires hospital evaluation. In addition, it sounds like she may be having right-sided sciatica or, less likely, claudication.  Plan: I gave her a supply of tramadol because she reports that it helps her right upper quadrant pain and her sciatica. I gave her 60 tablets with no refills, this can be managed by primary care going forward. I also sent a staph message to her PCP to alert them that she is having this leg pain. She will remain on ursodiol in hopes that it will make the bile less lithogenic. I will see her as needed.  Laurie [name]

## 2016-04-22 ENCOUNTER — Encounter: Payer: Self-pay | Admitting: Cardiology

## 2016-04-22 ENCOUNTER — Ambulatory Visit (INDEPENDENT_AMBULATORY_CARE_PROVIDER_SITE_OTHER): Payer: Commercial Managed Care - HMO | Admitting: Cardiology

## 2016-04-22 VITALS — BP 128/71 | HR 60 | Ht 67.0 in | Wt 164.8 lb

## 2016-04-22 DIAGNOSIS — I714 Abdominal aortic aneurysm, without rupture, unspecified: Secondary | ICD-10-CM

## 2016-04-22 DIAGNOSIS — I2510 Atherosclerotic heart disease of native coronary artery without angina pectoris: Secondary | ICD-10-CM

## 2016-04-22 DIAGNOSIS — I712 Thoracic aortic aneurysm, without rupture, unspecified: Secondary | ICD-10-CM

## 2016-04-22 DIAGNOSIS — I4891 Unspecified atrial fibrillation: Secondary | ICD-10-CM

## 2016-04-22 DIAGNOSIS — I739 Peripheral vascular disease, unspecified: Secondary | ICD-10-CM | POA: Insufficient documentation

## 2016-04-22 DIAGNOSIS — I779 Disorder of arteries and arterioles, unspecified: Secondary | ICD-10-CM | POA: Diagnosis not present

## 2016-04-22 MED ORDER — RIVAROXABAN 20 MG PO TABS: 20.0000 mg | ORAL_TABLET | Freq: Every day | ORAL | Status: DC

## 2016-04-22 NOTE — Assessment & Plan Note (Signed)
Continue diet. Intolerant to statins. 

## 2016-04-22 NOTE — Patient Instructions (Signed)
Medication Instructions:   NO CHANGE  Testing/Procedures:  Your physician has requested that you have a lower extremity arterial duplex. During this test, ultrasound are used to evaluate arterial blood flow in the legs. Allow one hour for this exam. There are no restrictions or special instructions.   Follow-Up:  Your physician wants you to follow-up in: [place] will receive a reminder letter in the mail two months in advance. If you don't receive a letter, please call our office to schedule the follow-up appointment.   If you need a refill on your cardiac medications before your next appointment, please call your pharmacy.

## 2016-04-22 NOTE — Assessment & Plan Note (Signed)
Patient with new onset claudication. Check ABIs with Doppler.

## 2016-04-22 NOTE — Assessment & Plan Note (Signed)
Follow-upCarotid Dopplers December 2017.

## 2016-04-22 NOTE — Assessment & Plan Note (Signed)
No aspirin given need for anticoagulation. Intolerant to statins.

## 2016-04-22 NOTE — Assessment & Plan Note (Signed)
Blood pressure controlled. Continue present medications. 

## 2016-04-22 NOTE — Telephone Encounter (Signed)
Urisodiol 500 mg was refilled by Dr Verlee Monte and was entered as 2 a day. I made a note on the medication list that pt only takes one a day.

## 2016-04-22 NOTE — Assessment & Plan Note (Signed)
Patient remains in sinus rhythm. Continue Cardizem and xarelto.

## 2016-04-22 NOTE — Assessment & Plan Note (Signed)
Plan follow-up CTA April 2018.

## 2016-04-22 NOTE — Assessment & Plan Note (Signed)
Follow-up CTAApril 2018.Marland Kitchen

## 2016-04-22 NOTE — Assessment & Plan Note (Signed)
Follow-up CTA April 2018.

## 2016-04-22 NOTE — Assessment & Plan Note (Signed)
Continue SBE prophylaxis. 

## 2016-04-23 ENCOUNTER — Telehealth: Payer: Self-pay | Admitting: Cardiology

## 2016-04-23 NOTE — Telephone Encounter (Signed)
New message     1. What dental office are you calling from? Son of the pt uncertain  2. What is your office phone and fax number? Son of the pt uncertain  3. What type of procedure is the patient having performed? Tooth extraction  4. What date is procedure scheduled? pending  5. What is your question (ex. Antibiotics prior to procedure, holding medication-we need to know how long dentist wants pt to hold med)? The pt is on blood thinner, the medication is Xarelto needs to know how long to be off blood thinner     Request for surgical clearance:  1. What type of surgery is being performed? Tooth extraction  2. When is this surgery scheduled? pending  3. Are there any medications that need to be held prior to surgery and how long? Xarelto  4. Name of physician performing surgery? Pt son uncertain at this time  5. What is your office phone and fax number? Pt uncertain    The pt's son wants to speak with the nurse cause the pt needs to tooth removed ASAP the tooth is really hurting the son states they need a form send to them so the pt can be seen by a dentist.

## 2016-04-23 NOTE — Telephone Encounter (Signed)
Ok to hold xarelto 2 days before and resume day after, needs sbe prophylaxis [name]

## 2016-04-23 NOTE — Telephone Encounter (Signed)
Returned call. Patient is needing a tooth extracted. Dentist office is [facility].  They are requesting a clearance and instruction if OK to hold Xarelto for extraction. Explained that generally med hold not recommended for single tooth extraction.  Office may be reached at [phone] or by fax at [phone]. They are open on Fridays. Pt plans to go tomorrow for consultation.

## 2016-04-24 NOTE — Telephone Encounter (Signed)
Will fax this note to the number provided. 

## 2016-05-05 ENCOUNTER — Inpatient Hospital Stay (HOSPITAL_COMMUNITY): Admission: RE | Admit: 2016-05-05 | Payer: Commercial Managed Care - HMO | Source: Ambulatory Visit

## 2016-05-18 NOTE — Progress Notes (Signed)
HPI: FU atrial fibrillation, CAD, ho aortic stenosis status post aortic valve replacement in September 2009. She had a pericardial tissue valve. She also had a septal myectomy and a right middle lobectomy due to lung CA. Note, she had a 50% mid LAD by catheterization preoperatively. Followup carotid Dopplers in Dec 2016 revealed moderate bilateral plaque with a right approaching 50%. Echocardiogram April 2017 showed normal LV function, grade 2 diastolic dysfunction, prior aortic valve replacement with mean gradient 6 mmHg. Abdominal CT April 2017 showed abdominal aortic aneurysm of 3.4 cm. Chest CT April 2017 showed stable left lower lobe posterior based nodule. Thoracic aorta measured 4.4 cm. Admitted in April 2017 with abdominal pain. Had ERCP (debris, sludge, pus removed) and was treated with antibiotics. She developed atrial fibrillation which was new. ABIs ordered at last office visi Since I last saw her,   Current Outpatient Prescriptions  Medication Sig Dispense Refill  . acetaminophen (TYLENOL) 500 MG tablet Take 500 mg by mouth every 6 (six) hours as needed for moderate pain, fever or headache. Pain     . ALPRAZolam (XANAX) 0.5 MG tablet Take 1 tablet (0.5 mg total) by mouth 2 (two) times daily. (Patient taking differently: Take 0.5 mg by mouth 2 (two) times daily as needed. ) 60 tablet 1  . dicyclomine (BENTYL) 20 MG tablet Take 0.5-1 tablets (10-20 mg total) by mouth 2 (two) times daily as needed for spasms. 30 tablet 3  . diltiazem (CARDIZEM CD) 180 MG 24 hr capsule Take 1 capsule (180 mg total) by mouth daily. 30 capsule 3  . lisinopril (PRINIVIL,ZESTRIL) 40 MG tablet TAKE 1 TABLET (40 MG TOTAL) BY MOUTH DAILY. 90 tablet 1  . metoprolol (LOPRESSOR) 50 MG tablet Take 1.5 tablets (75 mg total) by mouth 2 (two) times daily. 270 tablet 1  . rivaroxaban (XARELTO) 20 MG TABS tablet Take 1 tablet (20 mg total) by mouth daily with supper. 30 tablet 6  . traMADol (ULTRAM) 50 MG tablet Take  1 tablet (50 mg total) by mouth every 8 (eight) hours as needed. for pain 60 tablet 0  . ursodiol (ACTIGALL) 500 MG tablet Take 1 tablet (500 mg total) by mouth 2 (two) times daily. 60 tablet 5   No current facility-administered medications for this visit.     Past Medical History  Diagnosis Date  . Anemia     iron deficiency  . Shingles 11-09  . COPD (chronic obstructive pulmonary disease) ([place]) 2005    on CXR with R apical scaring  . Atrial fibrillation ([place])     post op  . Hypertension   . Cerebrovascular disease   . S/P aortic valve replacement 08/28/2008    [facility]  . S/P lobectomy of lung 08/28/2008    rt middle lobe  dr. Roxy Manns  . AAA (abdominal aortic aneurysm) ([place])   . CAD (coronary artery disease)   . Hypercholesterolemia     Hx: of  . Headache(784.0)     Hx: of migraines in the past  . Carcinoma of lung ([place]) 08/28/2008    T2N0 moderately diff. squamous cell CA  . Ascending aorta dilatation [facility])     Past Surgical History  Procedure Laterality Date  . Tonsillectomy and adenoidectomy    . U/s guided aspiration of r breast cyst   2009    at the breast clinic  . Rml removed dr Ricard Dillon for lung cancer  08/28/2008    T2N0 squamous cell CA  . Abdominal surgery    .  Aortic valve replacement  08/28/2008    [facility]  . Colonoscopy      \  . Cataract extraction w/ intraocular lens  implant, bilateral Bilateral   . Dilation and curettage of uterus    . Cardiac catheterization    . Appendectomy    . Cholecystectomy N/A 12/13/2013    Procedure: LAPAROSCOPIC CHOLECYSTECTOMY;  Surgeon: ARalene Ok MD;  [facility]  Service: General;  Laterality: N/A;  . Ercp N/A 05/08/2015    Procedure: ENDOSCOPIC RETROGRADE CHOLANGIOPANCREATOGRAPHY (ERCP);  Surgeon: RInda Castle MD;  [facility]  Service: Endoscopy;  Laterality: N/A;  . Ercp N/A 01/13/2016    Procedure: ENDOSCOPIC RETROGRADE CHOLANGIOPANCREATOGRAPHY (ERCP);   Surgeon: HDoran Stabler MD;  [facility];  Service: Endoscopy;  Laterality: N/A;  . Ercp N/A 03/09/2016    Procedure: ENDOSCOPIC RETROGRADE CHOLANGIOPANCREATOGRAPHY (ERCP);  Surgeon: MLadene Artist MD;  [facility];  Service: Endoscopy;  Laterality: N/A;    Social History   Social History  . Marital Status: Single    Spouse Name: N/A  . Number of Children: N/A  . Years of Education: N/A   Occupational History  . Not on file.   Social History Main Topics  . Smoking status: Former Smoker -- 40 years  . Smokeless tobacco: Never Used  . Alcohol Use: No  . Drug Use: No  . Sexual Activity: Not on file   Other Topics Concern  . Not on file   Social History Narrative    Family History  Problem Relation Age of Onset  . Stroke Father   . Diabetes Son     brittle diabetes  . Colon polyps Neg Hx   . Colon cancer Neg Hx   . Esophageal cancer Neg Hx   . Stomach cancer Neg Hx   . Pancreatic cancer Neg Hx   . Kidney disease Neg Hx   . Liver disease Neg Hx     ROS: no fevers or chills, productive cough, hemoptysis, dysphasia, odynophagia, melena, hematochezia, dysuria, hematuria, rash, seizure activity, orthopnea, PND, pedal edema, claudication. Remaining systems are negative.  Physical Exam: Well-developed well-nourished in no acute distress.  Skin is warm and dry.  HEENT is normal.  Neck is supple.  Chest is clear to auscultation with normal expansion.  Cardiovascular exam is regular rate and rhythm.  Abdominal exam nontender or distended. No masses palpated. Extremities show no edema. neuro grossly intact  ECG     This encounter was created in error - please disregard.

## 2016-05-26 ENCOUNTER — Other Ambulatory Visit: Payer: Self-pay | Admitting: Family Medicine

## 2016-05-27 ENCOUNTER — Encounter: Payer: Commercial Managed Care - HMO | Admitting: Cardiology

## 2016-06-08 ENCOUNTER — Telehealth: Payer: Self-pay | Admitting: Cardiology

## 2016-06-08 NOTE — Telephone Encounter (Signed)
She thinks the medicine is making her sick.She feels weak,lightheaded,blood pressure is high and heart rate low. Please call to advise.

## 2016-06-08 NOTE — Telephone Encounter (Signed)
Spoke with pt, she reports weakness, feeling lightheaded and dizziness. Her bp was up to 167/88 over the weekend but today it is 137/75. Her pulse is 66 to 58. She reports this started about one week and is there all the time. The dizziness and lightheadedness gets worse with turning her head. She also reports about one hour after taking the xarelto her stomach will burn but it goes away. Discussed with the pharm md, reassurance given to the pt. It sounds like vertigo is causing the dizziness. She was encouraged to try OTC claritin. Encouraged pt to follow up with PCP.

## 2016-06-10 ENCOUNTER — Other Ambulatory Visit: Payer: Self-pay | Admitting: Family Medicine

## 2016-06-16 ENCOUNTER — Telehealth: Payer: Self-pay | Admitting: Family Medicine

## 2016-06-16 NOTE — Telephone Encounter (Signed)
Called pt and left a voicemail to let her know she is past due for a fu on mood and bp. Thanks

## 2016-06-19 ENCOUNTER — Ambulatory Visit (INDEPENDENT_AMBULATORY_CARE_PROVIDER_SITE_OTHER): Payer: Commercial Managed Care - HMO | Admitting: Family Medicine

## 2016-06-19 ENCOUNTER — Encounter: Payer: Self-pay | Admitting: Family Medicine

## 2016-06-19 VITALS — BP 153/75 | HR 58 | Wt 174.0 lb

## 2016-06-19 DIAGNOSIS — M546 Pain in thoracic spine: Secondary | ICD-10-CM

## 2016-06-19 DIAGNOSIS — R51 Headache: Secondary | ICD-10-CM

## 2016-06-19 DIAGNOSIS — R599 Enlarged lymph nodes, unspecified: Secondary | ICD-10-CM | POA: Diagnosis not present

## 2016-06-19 DIAGNOSIS — M549 Dorsalgia, unspecified: Secondary | ICD-10-CM

## 2016-06-19 DIAGNOSIS — R590 Localized enlarged lymph nodes: Secondary | ICD-10-CM

## 2016-06-19 DIAGNOSIS — R52 Pain, unspecified: Secondary | ICD-10-CM

## 2016-06-19 DIAGNOSIS — R1011 Right upper quadrant pain: Secondary | ICD-10-CM

## 2016-06-19 DIAGNOSIS — R519 Headache, unspecified: Secondary | ICD-10-CM

## 2016-06-19 NOTE — Patient Instructions (Addendum)
Think about physical therapy for you upper back.

## 2016-06-19 NOTE — Progress Notes (Signed)
Subjective:    CC: Bodyaches   HPI:  81 year old female comes in today complaining of pain in the back of her head. She says it stretches from ear to ear. She says she's pressure it started after she was started on Xarelto. She's currently on that status post aortic valve replacement even though it somewhat off label use. But she says she's not interested in switching the medication.  She also continues to have pain between the shoulder blades just above the bra strap. She says the pain is usually worse in the morning when she first gets out of bed. She says sometimes she'll get up to pee at night and because of the pain it's hard to go back to sleep but it's not necessarily waking her up at night. Using a heating pad does seem to help. She feels like activities such as washing dishes, empty the dishwasher, and indenting the washing machine exacerbate her symptoms.  She's also had a lump on the right side of her neck that she noticed recently. She says it's really not tender or bothersome she has had a lot of allergy symptoms recently. And she does have a dog in the house.  He also continues to have some right upper quadrant pain after having her gallbladder removed. She had a stone in the bile duct and had ERCP in April of this year. But she plans to get back in with her GI doctor for this. She's currently on Actigall.   Past medical history, Surgical history, Family history not pertinant except as noted below, Social history, Allergies, and medications have been entered into the medical record, reviewed, and corrections made.   Review of Systems: No fevers, chills, night sweats, weight loss, chest pain, or shortness of breath.   Objective:    General: Well Developed, well nourished, and in no acute distress.  Neuro: Alert and oriented x3, extra-ocular muscles intact, sensation grossly intact.  HEENT: Normocephalic, atraumatic, Mildly swollen right anterior cervical lymph node. It's nontender  no erythema. Approximately 1 cm in size. Skin: Warm and dry, no rashes. Cardiac: Regular rate and rhythm, no murmurs rubs or gallops, no lower extremity edema.  Respiratory: Clear to auscultation bilaterally. Not using accessory muscles, speaking in full sentences. Back: Nontender over the thoracic spine. Nontender over the paraspinous muscles. With normal flexion and extension. Shoulders with normal range of motion.   Impression and Recommendations:   Upper back pain-discussed that the main treatment at this point is physical therapy. Explained the different modalities that they may be able to do for her in addition to working on core strengthening to reduce her pain with activities. I don't think this is related to her thoracic aortic aneurysm as she recently had a CT in April and it was stable at that time. It still measures approximately 4.4 cm. He wants to think about physical therapy. For now given exercises to do for her upper back on her own at home. She may even benefit from massage therapy or a TENS unit.  Right anterior cervical lymphadenopathy-she does have 1 node that's mildly swollen. Suspect this is likely from her recent allergy symptoms. Encouraged her to keep an eye on it over the next couple weeks and let me know if she feels like it's not resolving, or if it's getting larger, or if it becomes tender or red, or she runs a fever.  Right upper quadrant pain with prior history of bile bile duct stone. Did encourage her to follow back up  with her GI.  Headache at the back of the head. Could certainly be coming from her cervical spine. She feels like it may have been triggered by the Xarelto but she's not wanting to change the medication at this time.

## 2016-07-07 ENCOUNTER — Other Ambulatory Visit: Payer: Self-pay | Admitting: Family Medicine

## 2016-07-10 ENCOUNTER — Other Ambulatory Visit: Payer: Self-pay | Admitting: Family Medicine

## 2016-07-15 ENCOUNTER — Other Ambulatory Visit: Payer: Self-pay | Admitting: Cardiology

## 2016-07-15 NOTE — Telephone Encounter (Signed)
Rx(s) sent to pharmacy electronically.  

## 2016-07-23 ENCOUNTER — Other Ambulatory Visit: Payer: Self-pay | Admitting: *Deleted

## 2016-07-23 DIAGNOSIS — I712 Thoracic aortic aneurysm, without rupture, unspecified: Secondary | ICD-10-CM

## 2016-07-28 ENCOUNTER — Other Ambulatory Visit: Payer: Self-pay | Admitting: Cardiology

## 2016-07-28 NOTE — Telephone Encounter (Signed)
Rx request sent to pharmacy.  

## 2016-08-13 ENCOUNTER — Telehealth: Payer: Self-pay

## 2016-08-13 DIAGNOSIS — C342 Malignant neoplasm of middle lobe, bronchus or lung: Secondary | ICD-10-CM

## 2016-08-13 NOTE — Telephone Encounter (Signed)
Referral sent for Cardiac Thoracic Surgeon. [facility]: Rexene Alberts MD Address: [address], [place], [place] [address] Phone: [phone]

## 2016-08-20 ENCOUNTER — Other Ambulatory Visit: Payer: Self-pay | Admitting: Thoracic Surgery (Cardiothoracic Vascular Surgery)

## 2016-08-20 ENCOUNTER — Other Ambulatory Visit: Payer: Self-pay | Admitting: Family Medicine

## 2016-08-20 ENCOUNTER — Other Ambulatory Visit: Payer: Self-pay | Admitting: *Deleted

## 2016-08-20 DIAGNOSIS — I712 Thoracic aortic aneurysm, without rupture, unspecified: Secondary | ICD-10-CM

## 2016-08-20 LAB — CREATININE, SERUM: Creat: 0.73 mg/dL (ref 0.60–0.93)

## 2016-08-20 LAB — BUN: BUN: 10 mg/dL (ref 7–25)

## 2016-08-24 ENCOUNTER — Ambulatory Visit
Admission: RE | Admit: 2016-08-24 | Discharge: 2016-08-24 | Disposition: A | Discharge status: Home / Self Care | Payer: Commercial Managed Care - HMO | Source: Ambulatory Visit | Attending: Thoracic Surgery (Cardiothoracic Vascular Surgery) | Admitting: Thoracic Surgery (Cardiothoracic Vascular Surgery)

## 2016-08-24 ENCOUNTER — Encounter: Payer: Self-pay | Admitting: Thoracic Surgery (Cardiothoracic Vascular Surgery)

## 2016-08-24 ENCOUNTER — Ambulatory Visit (INDEPENDENT_AMBULATORY_CARE_PROVIDER_SITE_OTHER): Payer: Commercial Managed Care - HMO | Admitting: Thoracic Surgery (Cardiothoracic Vascular Surgery)

## 2016-08-24 ENCOUNTER — Other Ambulatory Visit: Payer: Commercial Managed Care - HMO

## 2016-08-24 VITALS — BP 160/80 | HR 64 | Resp 20 | Ht 67.0 in | Wt 169.0 lb

## 2016-08-24 DIAGNOSIS — R911 Solitary pulmonary nodule: Secondary | ICD-10-CM | POA: Diagnosis not present

## 2016-08-24 DIAGNOSIS — I712 Thoracic aortic aneurysm, without rupture, unspecified: Secondary | ICD-10-CM

## 2016-08-24 DIAGNOSIS — I77810 Thoracic aortic ectasia: Secondary | ICD-10-CM | POA: Diagnosis not present

## 2016-08-24 MED ORDER — IOPAMIDOL (ISOVUE-370) INJECTION 76%
75.0000 mL | Freq: Once | INTRAVENOUS | Status: AC | PRN
  Administered 2016-08-24: 75 mL via INTRAVENOUS

## 2016-08-24 NOTE — Progress Notes (Signed)
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 She has also been followed for mild fusiform aneurysmal dilatation of the ascending thoracic aorta. She was last seen here in our office on 08/19/2015. Since then she was hospitalized early last spring with choledochocholelithiasis requiring ERCP.  At that time she also developed atrial fibrillation and she is now chronically anticoagulated using Xarelto.  She continues to follow-up intermittently with Dr. Stanford Breed. She has had a lot of problems with chronic back pain reportedly has gotten considerably worse over the past year. She states that she has been struggling for the last several months because of severe pain in her back which she feels may be related to medical treatment using Actigall and Xarelto. She denies any problems with shortness of breath although she admits that she is not active at all physically. She has not had any bleeding complications. The remainder of her review of systems is noncontributory.   Current Outpatient Prescriptions  Medication Sig Dispense Refill  . acetaminophen (TYLENOL) 500 MG tablet Take 500 mg by mouth every 6 (six) hours as needed for moderate pain, fever or headache. Pain     . ALPRAZolam (XANAX) 0.5 MG tablet TAKE 1 TABLET BY MOUTH TWICE A DAY AS NEEDED --NEEDS APPOINTMENT FOR FUTURE REFILLS 30 tablet 0  . CARTIA XT 180 MG 24 hr capsule TAKE ONE CAPSULE BY MOUTH ONCE DAILY 30 capsule 6  . lisinopril  (PRINIVIL,ZESTRIL) 40 MG tablet TAKE 1 TABLET EVERY DAY 90 tablet 1  . metoprolol (LOPRESSOR) 50 MG tablet TAKE 1 AND 1/2 TABLETS TWICE DAILY 270 tablet 1  . rivaroxaban (XARELTO) 20 MG TABS tablet Take 1 tablet (20 mg total) by mouth daily with supper. 30 tablet 6  . traMADol (ULTRAM) 50 MG tablet Take 1 tablet (50 mg total) by mouth every 8 (eight) hours as needed. 60 tablet 3   No current facility-administered medications for this visit.       Physical Exam:   BP (!) 160/80 (BP Location: Right Arm, Patient Position: Sitting, Cuff Size: Normal)   Pulse 64   Resp 20   Ht '5\' 7"'$  (1.702 m)   Wt 169 lb (76.7 kg)   SpO2 96%   BMI 26.47 kg/m   General:  Mildly obese elderly female who appears somewhat frail  Chest:   Clear to auscultation with symmetrical breath sounds  CV:   Regular rate and rhythm  Incisions:  n/a  Abdomen:  Soft and nontender  Extremities:  Warm and well-perfused    Diagnostic Tests:  CT ANGIOGRAPHY CHEST WITH CONTRAST  TECHNIQUE: Multidetector CT imaging of the chest was performed using the standard protocol during bolus administration of intravenous contrast. Multiplanar CT image reconstructions and MIPs were obtained to evaluate the vascular anatomy.  CONTRAST:  75 cc Isovue 370  COMPARISON:  Chest CT dated 03/08/2016.  FINDINGS: Cardiovascular: Stable mild aneurysmal dilatation of the ascending thoracic aorta measuring 4.2 cm  diameter. Again noted are atherosclerotic changes throughout the thoracic aorta. No aortic dissection. Patient is status post aortic valve replacement.  Heart size is normal. No pericardial effusion. Coronary artery calcifications again noted.  Mediastinum/Nodes: No enlarged mediastinal, hilar, or axillary lymph nodes. Thyroid gland, trachea, and esophagus demonstrate no significant findings.  Lungs/Pleura: Slight interval enlargement of the pulmonary nodule within the posterior aspect of the left lower lobe  (series 5, image 91), now measuring 7 mm. This nodule has slowly increased in size over the previous studies (compared to initial measurements of 3-4 mm in 2014 and 2012).  There are stable postsurgical changes within the right lung. No new lung findings.  Upper Abdomen: Chronic pneumobilia again appreciated within the liver. Left adrenal mass is stable in size dating back to 2012 indicating benignity.  Musculoskeletal: Mild degenerative change within the kyphotic thoracic spine. Median sternotomy wires appear intact and appropriately positioned. No acute or suspicious osseous finding.  Review of the MIP images confirms the above findings.  IMPRESSION: 1. Slowly enlarging pulmonary nodule within the posterior aspect of the left lower lobe, now measuring 7 mm. This has progressively enlarged compared to earlier studies suggesting slow growing neoplastic nodule (measurements of 3-4 mm in 2014 and 2012). This nodule is likely of insufficient size to be further characterized with PET-CT. Recommend surgical consultation for further workup considerations. 2. Stable mild aneurysmal dilatation of the ascending thoracic aorta measuring 4.2 cm diameter. No aortic dissection. 3. Stable postsurgical changes of the right lung. 4. Coronary artery calcifications. 5. Aortic atherosclerosis. 6. Additional chronic/incidental findings detailed above.  These results will be called to the ordering clinician or representative by the Radiologist Assistant, and communication documented in the PACS or zVision Dashboard.   Electronically Signed   By: Franki Cabot M.D.   On: 08/24/2016 15:51    Impression:  I have personally reviewed the patient's follow-up CT angiogram performed today. Patient has stable mild fusiform aneurysmal enlargement of the ascending thoracic aorta with maximum transverse diameter measuring just over 4 cm.  The patient also has an intermediate sized nodule located  in the left lower lobe posteriorly close to the base which has been present on all previous CT scans dating back to 2009. This nodule may have gotten slightly larger over that period of time, although it still measures less than 8 mm in diameter. It is clearly very very slow growing, and it is probably too small to characterize using PET CT imaging.  Options include elective surgical resection versus continued observation.    Plan:  I discussed this finding at length with the patient in the office today. She is not interested in surgery.  Under the circumstances I completely agree with her as morbidity of general anesthesia and video-assisted thoracoscopy for definitive surgical resection would be considerable in this patient.  This nodule has been there for years and may well still be behind, and even if it is a malignancy it is clearly extremely slow growing. Under the circumstances, it might be reasonable to increase the frequency of surveillance.  The patient will return in 6 months for a limited CT scan of the chest to follow-up the left lower lobe lung nodule.    I spent in excess of 15 minutes during the conduct of this office consultation and >50% of this time involved direct face-to-face encounter with the patient for counseling and/or coordination of their care.    Valentina Gu. Roxy Manns, MD 08/24/2016 4:21 PM

## 2016-08-24 NOTE — Patient Instructions (Signed)
Continue all previous medications without any changes at this time  

## 2016-08-31 ENCOUNTER — Encounter: Payer: Self-pay | Admitting: Family Medicine

## 2016-08-31 ENCOUNTER — Ambulatory Visit (INDEPENDENT_AMBULATORY_CARE_PROVIDER_SITE_OTHER): Payer: Commercial Managed Care - HMO | Admitting: Family Medicine

## 2016-08-31 VITALS — BP 136/69 | HR 67 | Wt 170.0 lb

## 2016-08-31 DIAGNOSIS — Z9622 Myringotomy tube(s) status: Secondary | ICD-10-CM

## 2016-08-31 DIAGNOSIS — H9201 Otalgia, right ear: Secondary | ICD-10-CM

## 2016-08-31 DIAGNOSIS — I10 Essential (primary) hypertension: Secondary | ICD-10-CM

## 2016-08-31 DIAGNOSIS — I4891 Unspecified atrial fibrillation: Secondary | ICD-10-CM | POA: Diagnosis not present

## 2016-08-31 DIAGNOSIS — R51 Headache: Secondary | ICD-10-CM

## 2016-08-31 DIAGNOSIS — M542 Cervicalgia: Secondary | ICD-10-CM

## 2016-08-31 DIAGNOSIS — R519 Headache, unspecified: Secondary | ICD-10-CM

## 2016-08-31 NOTE — Progress Notes (Signed)
Subjective:    CC: Headache   HPI:  Says can't hear as well in her right ear.  Her hearing has been getting wrorse per her audiologist.  Says it is sore to lay on her right ear as well. No drainage, fever or chills.  She has been getting a HA at the base of her skull that has been sore and bothersome.   Has a tube in the right ear since age 81. She Has an ENT appt on 10/17.  She reports that her right eye is tender to touch as well but has not been red or swollen or itchy.  HTN - she broughtin  her home blood pressures since it was up last time I saw her. Her home blood pressures look fantastic.   Atrial fibrillation-she says she plans on getting back in with Dr. [name] to see if she still needs to be on the Xarelto. She feels like she doesn't feel well about 2 hours after she takes a dose really like to be able to get off of it.  Past medical history, Surgical history, Family history not pertinant except as noted below, Social history, Allergies, and medications have been entered into the medical record, reviewed, and corrections made.   Review of Systems: No fevers, chills, night sweats, weight loss, chest pain, or shortness of breath.   Objective:    General: Well Developed, well nourished, and in no acute distress.  Neuro: Alert and oriented x3, extra-ocular muscles intact, sensation grossly intact.  HEENT: Normocephalic, atraumatic, right TM intact with tympanostomy tube in place. It does not look blocker clogged. Left TM and canal are clear as well. Oropharynx is clear. No significant cervical lymphadenopathy. Skin: Warm and dry, no rashes. Cardiac: Regular rate and rhythm, no murmurs rubs or gallops, no lower extremity edema.  Respiratory: slight expiratory wheezing bilat. . Not using accessory muscles, speaking in full sentences. Msk: She is mildly tender at the base of the skull on the right side.Normal flexion extension but decreased rotation right and left. She says it  feels very tight when she rotates side to side. Nontender over the cervical spine itself. Shoulders with normal range of motion.   Impression and Recommendations:   Pain at base of skull - Likely muscle skeletal in origin. I really think is coming from her cervical spine. Handout given with some stretches to do on her own at home. Next  Hypertension-home blood pressures look fantastic. Well controlled. Continue current regimen. Follow up in  6 mo.   Right ear pain-unclear etiology. The TM and canal look clear and the tympanic tube is completely open. I don't see any active drainage. No fevers chills or sweats. She has been having some more allergy symptoms recently so certainly this could be causing some increased pressure. She does have the appointment with ENT in about 2 weeks and encouraged her to keep that appointment. Certainly if something gets worse and she can give [place] a call back. Can even consider a oral antihistamine but patient declines.  Atrial fibrillation-certainly she can talk with Dr. Stanford Breed and see what he thinks. At the least he might be able to switch which anticoagulation agent she takes to see if she may have fewer symptoms.

## 2016-09-07 ENCOUNTER — Emergency Department (HOSPITAL_COMMUNITY)
Admission: EM | Admit: 2016-09-07 | Discharge: 2016-09-07 | Disposition: A | Discharge status: Home / Self Care | Payer: Commercial Managed Care - HMO | Attending: Emergency Medicine | Admitting: Emergency Medicine

## 2016-09-07 DIAGNOSIS — I2510 Atherosclerotic heart disease of native coronary artery without angina pectoris: Secondary | ICD-10-CM | POA: Diagnosis not present

## 2016-09-07 DIAGNOSIS — I10 Essential (primary) hypertension: Secondary | ICD-10-CM | POA: Diagnosis not present

## 2016-09-07 DIAGNOSIS — Z8673 Personal history of transient ischemic attack (TIA), and cerebral infarction without residual deficits: Secondary | ICD-10-CM | POA: Diagnosis not present

## 2016-09-07 DIAGNOSIS — Z7901 Long term (current) use of anticoagulants: Secondary | ICD-10-CM | POA: Diagnosis not present

## 2016-09-07 DIAGNOSIS — Z5321 Procedure and treatment not carried out due to patient leaving prior to being seen by health care provider: Secondary | ICD-10-CM | POA: Insufficient documentation

## 2016-09-07 DIAGNOSIS — R1011 Right upper quadrant pain: Secondary | ICD-10-CM | POA: Insufficient documentation

## 2016-09-07 DIAGNOSIS — Z85118 Personal history of other malignant neoplasm of bronchus and lung: Secondary | ICD-10-CM | POA: Diagnosis not present

## 2016-09-07 DIAGNOSIS — Z87891 Personal history of nicotine dependence: Secondary | ICD-10-CM | POA: Diagnosis not present

## 2016-09-07 DIAGNOSIS — J449 Chronic obstructive pulmonary disease, unspecified: Secondary | ICD-10-CM | POA: Diagnosis not present

## 2016-09-07 LAB — CBC
HCT: 46.7 % — ABNORMAL HIGH (ref 36.0–46.0)
Hemoglobin: 15.6 g/dL — ABNORMAL HIGH (ref 12.0–15.0)
MCH: 29.8 pg (ref 26.0–34.0)
MCHC: 33.4 g/dL (ref 30.0–36.0)
MCV: 89.1 fL (ref 78.0–100.0)
PLATELETS: 267 10*3/uL (ref 150–400)
RBC: 5.24 MIL/uL — ABNORMAL HIGH (ref 3.87–5.11)
RDW: 13 % (ref 11.5–15.5)
WBC: 11.7 10*3/uL — ABNORMAL HIGH (ref 4.0–10.5)

## 2016-09-07 LAB — COMPREHENSIVE METABOLIC PANEL
ALK PHOS: 90 U/L (ref 38–126)
ALT: 23 U/L (ref 14–54)
AST: 22 U/L (ref 15–41)
Albumin: 4.1 g/dL (ref 3.5–5.0)
Anion gap: 8 (ref 5–15)
BUN: 15 mg/dL (ref 6–20)
CALCIUM: 9.4 mg/dL (ref 8.9–10.3)
CHLORIDE: 95 mmol/L — AB (ref 101–111)
CO2: 28 mmol/L (ref 22–32)
CREATININE: 0.89 mg/dL (ref 0.44–1.00)
GFR calc non Af Amer: >60 mL/min (ref >60)
Glucose, Bld: 125 mg/dL — ABNORMAL HIGH (ref 65–99)
Potassium: 4.4 mmol/L (ref 3.5–5.1)
SODIUM: 131 mmol/L — AB (ref 135–145)
Total Bilirubin: 0.5 mg/dL (ref 0.3–1.2)
Total Protein: 7.1 g/dL (ref 6.5–8.1)

## 2016-09-07 LAB — LIPASE, BLOOD: LIPASE: 30 U/L (ref 11–51)

## 2016-09-07 NOTE — ED Triage Notes (Signed)
Pt states that she has had her gallbladder taken out but has since had two gallstones block ducts causing her to become septic once. RUQ pain. Alert and oriented. Denies N/V/D.

## 2016-09-08 ENCOUNTER — Telehealth: Payer: Self-pay | Admitting: Gastroenterology

## 2016-09-08 NOTE — Telephone Encounter (Signed)
Patient will try the Tylenol and heating pad. She understands to contact her PCP if she would like to try stronger pain medication. She understands the careful usage of the pain medication and xanax.  Instructed her to go to the ER if she should develop a fever or increasing pain and wait until a physician is able to see her.

## 2016-09-08 NOTE — Telephone Encounter (Addendum)
Yes, I recall her telling me that she has had RUQ pain chronically since her gallbladder surgery years ago.    When she has had bile duct stones in Feb and Apr this year, her Liver chemistry labs were elevated, which they are not on this occasion.  So I think she does not likely have stones in the bile duct at this time.  I recommend a heating pad and tylenol for pain.  If she needs something stronger, must see primary care because of her age and concomitant use of xanax..  I am hopeful that this is just a flare of that post-operative pain and that it will settle down. If the pain escalates despite the pain meds and/or if she develops a fever, she should return to the ED and wait to be seen this time.  Rx for pain meds must be taken with caution since she also has xanax

## 2016-09-08 NOTE — Telephone Encounter (Signed)
Spoke to patient, she is continuing to have RUQ pain. Went to ED yesterday and had labs done. She states she left and went home due to wait to be seen.

## 2016-09-11 ENCOUNTER — Other Ambulatory Visit: Payer: Self-pay | Admitting: Family Medicine

## 2016-09-11 MED ORDER — ALPRAZOLAM 0.5 MG PO TABS: 0.5000 mg | ORAL_TABLET | Freq: Two times a day (BID) | ORAL | 4 refills | Status: DC | PRN

## 2016-09-14 ENCOUNTER — Other Ambulatory Visit: Payer: Self-pay | Admitting: Family Medicine

## 2016-09-15 DIAGNOSIS — H6531 Chronic mucoid otitis media, right ear: Secondary | ICD-10-CM | POA: Diagnosis not present

## 2016-09-15 DIAGNOSIS — H90A31 Mixed conductive and sensorineural hearing loss, unilateral, right ear with restricted hearing on the contralateral side: Secondary | ICD-10-CM | POA: Diagnosis not present

## 2016-09-19 ENCOUNTER — Encounter (HOSPITAL_COMMUNITY): Payer: Self-pay | Admitting: Emergency Medicine

## 2016-09-19 ENCOUNTER — Emergency Department (HOSPITAL_COMMUNITY)
Admission: EM | Admit: 2016-09-19 | Discharge: 2016-09-20 | Disposition: A | Discharge status: Home / Self Care | Payer: Commercial Managed Care - HMO | Attending: Emergency Medicine | Admitting: Emergency Medicine

## 2016-09-19 ENCOUNTER — Emergency Department (HOSPITAL_COMMUNITY): Payer: Commercial Managed Care - HMO

## 2016-09-19 DIAGNOSIS — R1011 Right upper quadrant pain: Secondary | ICD-10-CM | POA: Diagnosis not present

## 2016-09-19 DIAGNOSIS — J449 Chronic obstructive pulmonary disease, unspecified: Secondary | ICD-10-CM | POA: Insufficient documentation

## 2016-09-19 DIAGNOSIS — Z87891 Personal history of nicotine dependence: Secondary | ICD-10-CM | POA: Diagnosis not present

## 2016-09-19 DIAGNOSIS — Z79899 Other long term (current) drug therapy: Secondary | ICD-10-CM | POA: Diagnosis not present

## 2016-09-19 DIAGNOSIS — K5730 Diverticulosis of large intestine without perforation or abscess without bleeding: Secondary | ICD-10-CM | POA: Diagnosis not present

## 2016-09-19 DIAGNOSIS — I10 Essential (primary) hypertension: Secondary | ICD-10-CM | POA: Diagnosis not present

## 2016-09-19 DIAGNOSIS — I2510 Atherosclerotic heart disease of native coronary artery without angina pectoris: Secondary | ICD-10-CM | POA: Insufficient documentation

## 2016-09-19 LAB — COMPREHENSIVE METABOLIC PANEL
ALBUMIN: 4 g/dL (ref 3.5–5.0)
ALK PHOS: 93 U/L (ref 38–126)
ALT: 17 U/L (ref 14–54)
AST: 22 U/L (ref 15–41)
Anion gap: 8 (ref 5–15)
BUN: 12 mg/dL (ref 6–20)
CALCIUM: 9.3 mg/dL (ref 8.9–10.3)
CHLORIDE: 97 mmol/L — AB (ref 101–111)
CO2: 31 mmol/L (ref 22–32)
CREATININE: 0.81 mg/dL (ref 0.44–1.00)
GFR calc Af Amer: >60 mL/min (ref >60)
GFR calc non Af Amer: >60 mL/min (ref >60)
GLUCOSE: 126 mg/dL — AB (ref 65–99)
Potassium: 3.8 mmol/L (ref 3.5–5.1)
SODIUM: 136 mmol/L (ref 135–145)
Total Bilirubin: 0.8 mg/dL (ref 0.3–1.2)
Total Protein: 7.2 g/dL (ref 6.5–8.1)

## 2016-09-19 LAB — URINALYSIS, ROUTINE W REFLEX MICROSCOPIC
BILIRUBIN URINE: NEGATIVE
GLUCOSE, UA: NEGATIVE mg/dL
HGB URINE DIPSTICK: NEGATIVE
Ketones, ur: NEGATIVE mg/dL
Nitrite: NEGATIVE
Protein, ur: NEGATIVE mg/dL
SPECIFIC GRAVITY, URINE: 1.009 (ref 1.005–1.030)
pH: 6.5 (ref 5.0–8.0)

## 2016-09-19 LAB — URINE MICROSCOPIC-ADD ON
BACTERIA UA: NONE SEEN
RBC / HPF: NONE SEEN RBC/hpf (ref 0–5)

## 2016-09-19 LAB — CBC
HCT: 47.1 % — ABNORMAL HIGH (ref 36.0–46.0)
Hemoglobin: 15.7 g/dL — ABNORMAL HIGH (ref 12.0–15.0)
MCH: 29.8 pg (ref 26.0–34.0)
MCHC: 33.3 g/dL (ref 30.0–36.0)
MCV: 89.4 fL (ref 78.0–100.0)
PLATELETS: 276 10*3/uL (ref 150–400)
RBC: 5.27 MIL/uL — AB (ref 3.87–5.11)
RDW: 12.8 % (ref 11.5–15.5)
WBC: 9.4 10*3/uL (ref 4.0–10.5)

## 2016-09-19 LAB — LIPASE, BLOOD: LIPASE: 29 U/L (ref 11–51)

## 2016-09-19 MED ORDER — IOPAMIDOL (ISOVUE-300) INJECTION 61%
100.0000 mL | Freq: Once | INTRAVENOUS | Status: AC | PRN
  Administered 2016-09-19: 100 mL via INTRAVENOUS

## 2016-09-19 MED ORDER — SODIUM CHLORIDE 0.9 % IV BOLUS (SEPSIS)
500.0000 mL | Freq: Once | INTRAVENOUS | Status: AC
  Administered 2016-09-19: 500 mL via INTRAVENOUS

## 2016-09-19 MED ORDER — ONDANSETRON HCL 4 MG/2ML IJ SOLN
4.0000 mg | Freq: Once | INTRAMUSCULAR | Status: AC
  Administered 2016-09-19: 4 mg via INTRAVENOUS
  Filled 2016-09-19: qty 2

## 2016-09-19 MED ORDER — FENTANYL CITRATE (PF) 100 MCG/2ML IJ SOLN
100.0000 ug | Freq: Once | INTRAMUSCULAR | Status: AC
  Administered 2016-09-19: 100 ug via INTRAVENOUS
  Filled 2016-09-19: qty 2

## 2016-09-19 NOTE — ED Provider Notes (Signed)
[facility] DEPT Provider Note   CSN: [id number] Arrival date & time: 09/19/16  1932 By signing my name below, I, Dyke Brackett, attest that this documentation has been prepared under the direction and in the presence of non-physician practitioner, Dewaine Oats PA-C Electronically Signed: Dyke Brackett, Scribe. 09/19/2016. 9:32 PM.   History   Chief Complaint Chief Complaint  Patient presents with  . Abdominal Pain   HPI Laurie [name] is a 81 y.o. female with hx of AAA, CAD, COPD, cholangitis and diverticulosis who presents to the Emergency Department complaining of acute on chronic diffuse abdominal pain, worse in the right side which began this morning. Pt states she usually has some light abdominal pain, but this is much worse than normal. She describes the pain as sore and severe. The pain is exacerbated by eating or drinking. She notes associated nausea and headache. She states yesterday she had three bowel movements that felt like "something scraped my insides." Her gallbladder was removed 2 years ago; she had CBD stones removed  In 02/17. Pt denies CP, vomiting, difficulty urinating, dysuria, diarrhea, or fever.   The history is provided by the patient. No language interpreter was used.   Past Medical History:  Diagnosis Date  . AAA (abdominal aortic aneurysm) ([place])   . Anemia    iron deficiency  . Ascending aorta dilatation (HCC)   . Atrial fibrillation ([place])    post op  . CAD (coronary artery disease)   . Carcinoma of lung ([place]) 08/28/2008   T2N0 moderately diff. squamous cell CA  . Cerebrovascular disease   . COPD (chronic obstructive pulmonary disease) ([place]) 2005   on CXR with R apical scaring  . Headache(784.0)    Hx: of migraines in the past  . Hypercholesterolemia    Hx: of  . Hypertension   . Incidental pulmonary nodule, > 40m and < 877m   LLL  . S/P aortic valve replacement 08/28/2008   owen  . S/P lobectomy of lung 08/28/2008   rt middle lobe   dr. owRoxy Manns. Shingles 11-09    Patient Active Problem List   Diagnosis Date Noted  . Incidental pulmonary nodule, > 71m29mnd < 8mm571m. Claudication (HCC)[place]/24/2017  . Chronic obstructive pulmonary disease (HCC)[place]. Dyspnea   . Cholangitis   . Acute respiratory failure with hypoxia (HCC)[name]. Atrial fibrillation with RVR (HCC)[place]. HTN (hypertension) 03/09/2016  . Abdominal pain 03/09/2016  . COPD (chronic obstructive pulmonary disease) (HCC)[place]. RUQ abdominal pain   . Elevated LFTs   . Biliary calculus   . Pulmonary emphysema (HCC)[place]. Aortic valve disease   . Thoracic aortic aneurysm without rupture (HCC)[place]/19/2016  . Ascending aorta dilatation (HCC)   . Abdominal aortic aneurysm (HCC)[place]/26/2012  . HYPERGLYCEMIA, MILD 11/10/2010  . OSTEOPENIA 09/29/2010  . Coronary atherosclerosis 05/15/2009  . Bilateral carotid artery disease (HCC)[place]/16/2010  . TIA 05/15/2009  . ILIAC ARTERY ANEURYSM 05/15/2009  . GAD (generalized anxiety disorder) 03/20/2009  . THYROID NODULE 01/09/2009  . THYROMEGALY 01/08/2009  . SIALOLITHIASIS 01/08/2009  . MALIGNANT NEOPLASM MIDDLE LOBE BRONCHUS OR LUNG 10/24/2008  . HLD (hyperlipidemia) 10/24/2008  . AORTIC VALVE REPLACEMENT, HX OF 10/24/2008  . S/P aortic valve replacement 08/28/2008  . S/P lobectomy of lung 08/28/2008  . DIVERTICULOSIS OF COLON 06/19/2008  . ANEMIA, IRON DEFICIENCY 06/06/2008  . Essential hypertension, benign 06/06/2008  . GASTROINTESTINAL HEMORRHAGE, HX  OF 06/06/2008    Past Surgical History:  Procedure Laterality Date  . ABDOMINAL SURGERY    . AORTIC VALVE REPLACEMENT  08/28/2008   [facility]  . APPENDECTOMY    . CARDIAC CATHETERIZATION    . CATARACT EXTRACTION W/ INTRAOCULAR LENS  IMPLANT, BILATERAL Bilateral   . CHOLECYSTECTOMY N/A 12/13/2013   Procedure: LAPAROSCOPIC CHOLECYSTECTOMY;  Surgeon: ARalene Ok MD;  [facility]  Service: General;  Laterality: N/A;  .  COLONOSCOPY     \  . DILATION AND CURETTAGE OF UTERUS    . ERCP N/A 05/08/2015   Procedure: ENDOSCOPIC RETROGRADE CHOLANGIOPANCREATOGRAPHY (ERCP);  Surgeon: RInda Castle MD;  [facility]  Service: Endoscopy;  Laterality: N/A;  . ERCP N/A 01/13/2016   Procedure: ENDOSCOPIC RETROGRADE CHOLANGIOPANCREATOGRAPHY (ERCP);  Surgeon: HDoran Stabler MD;  [facility];  Service: Endoscopy;  Laterality: N/A;  . ERCP N/A 03/09/2016   Procedure: ENDOSCOPIC RETROGRADE CHOLANGIOPANCREATOGRAPHY (ERCP);  Surgeon: MLadene Artist MD;  [facility];  Service: Endoscopy;  Laterality: N/A;  . RML removed Dr ORicard Dillonfor lung cancer  08/28/2008   T2N0 squamous cell CA  . TONSILLECTOMY AND ADENOIDECTOMY    . u/s guided aspiration of R breast cyst   2009   at the breast clinic    OB History    No data available      Home Medications    Prior to Admission medications   Medication Sig Start Date End Date Taking? Authorizing Provider  acetaminophen (TYLENOL) 500 MG tablet Take 500 mg by mouth every 6 (six) hours as needed for moderate pain, fever or headache. Pain     Historical Provider, MD  ALPRAZolam (XANAX) 0.5 MG tablet Take 1 tablet (0.5 mg total) by mouth 2 (two) times daily as needed for anxiety. 09/11/16   CHali Marry MD  CARTIA XT 180 MG 24 hr capsule TAKE ONE CAPSULE BY MOUTH ONCE DAILY 07/28/16   BLelon Perla MD  lisinopril (PRINIVIL,ZESTRIL) 40 MG tablet TAKE 1 TABLET EVERY DAY 07/15/16   BLelon Perla MD  metoprolol (LOPRESSOR) 50 MG tablet TAKE 1 AND 1/2 TABLETS TWICE DAILY 07/15/16   BLelon Perla MD  rivaroxaban (XARELTO) 20 MG TABS tablet Take 1 tablet (20 mg total) by mouth daily with supper. 04/22/16   BLelon Perla MD  traMADol (ULTRAM) 50 MG tablet Take 1 tablet (50 mg total) by mouth every 8 (eight) hours as needed. 07/07/16   CHali Marry MD    Family History Family History  Problem Relation Age of Onset  . Stroke  Father   . Diabetes Son     brittle diabetes  . Colon polyps Neg Hx   . Colon cancer Neg Hx   . Esophageal cancer Neg Hx   . Stomach cancer Neg Hx   . Pancreatic cancer Neg Hx   . Kidney disease Neg Hx   . Liver disease Neg Hx     Social History Social History  Substance Use Topics  . Smoking status: Former Smoker    Years: 40.00  . Smokeless tobacco: Never Used  . Alcohol use No   Allergies   Hyoscyamine; Statins; and Rofecoxib  Review of Systems Review of Systems  Constitutional: Negative for fever.  Respiratory: Negative for shortness of breath.   Cardiovascular: Negative for chest pain.  Gastrointestinal: Positive for abdominal pain and nausea. Negative for blood in stool, diarrhea and vomiting.  Genitourinary: Negative  for difficulty urinating and dysuria.  Musculoskeletal: Negative for myalgias.  Skin: Negative for color change and rash.  Neurological: Positive for headaches.   Physical Exam Updated Vital Signs BP 164/89 (BP Location: Left Arm)   Pulse 71   Temp 97.8 F (36.6 C) (Oral)   Resp 18   Ht '5\' 7"'$  (1.702 m)   Wt 170 lb (77.1 kg)   SpO2 95%   BMI 26.63 kg/m   Physical Exam  Constitutional: She is oriented to person, place, and time. She appears well-developed and well-nourished. No distress.  HENT:  Head: Normocephalic and atraumatic.  Moist mucus membranes   Eyes: Conjunctivae are normal.  Cardiovascular: Normal rate and regular rhythm.   Murmur heard. Pulmonary/Chest: Effort normal. She has no wheezes. She has no rales.  Abdominal: She exhibits no distension. There is tenderness (diffusely tender).  Bowel sounds are active  Neurological: She is alert and oriented to person, place, and time.  Skin: Skin is warm and dry.  Psychiatric: She has a normal mood and affect.  Nursing note and vitals reviewed.  ED Treatments / Results  DIAGNOSTIC STUDIES:  Oxygen Saturation is 95% on RA, adequate by my interpretation.    COORDINATION OF  CARE:  9:04 PM Will order CT abdomen. Discussed treatment plan with pt at bedside and pt agreed to plan.  Labs (all labs ordered are listed, but only abnormal results are displayed) Labs Reviewed  COMPREHENSIVE METABOLIC PANEL - Abnormal; Notable for the following:       Result Value   Chloride 97 (*)    Glucose, Bld 126 (*)    All other components within normal limits  CBC - Abnormal; Notable for the following:    RBC 5.27 (*)    Hemoglobin 15.7 (*)    HCT 47.1 (*)    All other components within normal limits  URINALYSIS, ROUTINE W REFLEX MICROSCOPIC (NOT AT [facility]) - Abnormal; Notable for the following:    Leukocytes, UA TRACE (*)    All other components within normal limits  URINE MICROSCOPIC-ADD ON - Abnormal; Notable for the following:    Squamous Epithelial / LPF 0-5 (*)    All other components within normal limits  LIPASE, BLOOD    EKG  EKG Interpretation None       Radiology No results found.  Procedures Procedures (including critical care time)  Medications Ordered in ED Medications - No data to display   Initial Impression / Assessment and Plan / ED Course  I have reviewed the triage vital signs and the nursing notes.  Pertinent labs & imaging results that were available during my care of the patient were reviewed by me and considered in my medical decision making (see chart for details).  Clinical Course    Patient with a history of cholecystectomy and subsequent biliary stone removed presents with RUQ pain. No fever.   Labs are essentially unremarkable - no leukocytosis, no elevated LFTs. CT scan shows interim improvement in biliary dilation since last exam. The patient's pain is managed and there is no vomiting here.   Discussed with GI who advises she can be discharged home with close follow up with Dr. Loletha Carrow in the office for further management. Strict return precautions are given to the patient. Patient and family are comfortable with discharge  home.   Final Clinical Impressions(s) / ED Diagnoses   Final diagnoses:  None   1. RUQ abdominal pain  New Prescriptions New Prescriptions   No medications on file  I personally performed the services described in this documentation, which was scribed in my presence. The recorded information has been reviewed and is accurate.      Charlann Lange, PA-C 09/28/16 [place], MD 09/29/16 [phone]

## 2016-09-19 NOTE — ED Triage Notes (Signed)
Pt reports having abd pain and nausea that started this morning. Pt reports having prior issues with gallstones but has had gallbladder removed.

## 2016-09-20 NOTE — Discharge Instructions (Signed)
Call Dr. Loletha Carrow' office for an appointment early this week for recheck of recurrent pain. Return to the ED with any fever, chills, severe pain, uncontrolled vomiting, or new concern.

## 2016-09-21 ENCOUNTER — Other Ambulatory Visit: Payer: Self-pay

## 2016-09-21 ENCOUNTER — Telehealth: Payer: Self-pay

## 2016-09-21 DIAGNOSIS — R1011 Right upper quadrant pain: Secondary | ICD-10-CM

## 2016-09-21 NOTE — Telephone Encounter (Signed)
Lvm for patient to call back. LFT's are ordered and she has a follow up visit with Dr. Loletha Carrow on 12/8 @ 1:30.

## 2016-09-21 NOTE — Telephone Encounter (Signed)
-----   Message from Manus Gunning, MD sent at 09/21/2016  8:26 AM EDT ----- Laurie [name] this patient is one of Dr. Loletha Carrow, was seen in the ED this weekend. She should have follow up LFTs done this week to ensure stable, and have a follow up with Dr. Loletha Carrow at some point in time. Not urgent if her symptoms resolved. Thanks

## 2016-09-21 NOTE — Telephone Encounter (Signed)
Spoke to patient and she would like to go to her PCP's office to have her labs done. Will fax order to their office. Instructed patient to call if she is not better or symptoms worsen and will get her worked in to see one of the PA's, as her appointment with Dr. Loletha Carrow is not until 12/8.

## 2016-09-22 ENCOUNTER — Encounter: Payer: Self-pay | Admitting: Gastroenterology

## 2016-09-22 DIAGNOSIS — R1011 Right upper quadrant pain: Secondary | ICD-10-CM | POA: Diagnosis not present

## 2016-09-29 ENCOUNTER — Telehealth: Payer: Self-pay | Admitting: Gastroenterology

## 2016-09-29 NOTE — Telephone Encounter (Signed)
Please let her know that her liver labs are normal, which I believe speaks against there being stones in the bile duct. I'll see her as scheduled in the office

## 2016-09-29 NOTE — Telephone Encounter (Signed)
Patient advised of lab results and that Dr. Loletha Carrow will follow up with her as scheduled on 11/06/16.

## 2016-10-02 ENCOUNTER — Telehealth: Payer: Self-pay | Admitting: Cardiology

## 2016-10-02 NOTE — Telephone Encounter (Signed)
New message    Patient calling wants Dr. Stanford Breed to do a test to see if she has Afib. C/o muscle weakness.

## 2016-10-02 NOTE — Telephone Encounter (Signed)
Spoke with pt, she reports feeling weak for several weeks now and feels it is getting worse. She states muscle weakness is listed as a side effect of xarelto. She also reports feeling her heart rate elevate for about one hour after she takes the xarelto. She denies chest pain, she is not sure if she is SOB or not. She only feels her pulse being up after taking the xarelto. Patient ask for an appointment to see dr Stanford Breed sometime this month. She will not go to urgent are or the ER for eval. Follow up scheduled and patient voiced understanding to call if symptoms change. Pt agreed with this plan.

## 2016-10-15 NOTE — Progress Notes (Signed)
HPI: FU atrial fibrillation, CAD, ho aortic stenosis status post aortic valve replacement in September 2009. She had a pericardial tissue valve. She also had a septal myectomy and a right middle lobectomy due to lung CA. Note, she had a 50% mid LAD by catheterization preoperatively. Followup carotid Dopplers in Dec 2016 revealed moderate bilateral plaque with a right approaching 50%. Echocardiogram April 2017 showed normal LV function, grade 2 diastolic dysfunction, prior aortic valve replacement with mean gradient 6 mmHg. Patient developed atrial fibrillation following gallbladder surgery previously. Abdominal CT October 2017 showed 3.9 cm infrarenal abdominal aortic aneurysm. There was a 4 cm aneurysm of the distal descending thoracic aorta. Since I last saw her, she has some dyspnea on exertion but no orthopnea, PND or pedal edema. She complains of back pain, right upper quadrant and right hip pain. Multiple complaints. No bleeding. Recent laboratories show hemoglobin 15.7.  Current Outpatient Prescriptions  Medication Sig Dispense Refill  . acetaminophen (TYLENOL) 500 MG tablet Take 500 mg by mouth every 6 (six) hours as needed for moderate pain, fever or headache. Pain     . ALPRAZolam (XANAX) 0.5 MG tablet Take 1 tablet (0.5 mg total) by mouth 2 (two) times daily as needed for anxiety. 60 tablet 4  . CARTIA XT 180 MG 24 hr capsule TAKE ONE CAPSULE BY MOUTH ONCE DAILY 30 capsule 6  . lisinopril (PRINIVIL,ZESTRIL) 40 MG tablet TAKE 1 TABLET EVERY DAY 90 tablet 1  . metoprolol (LOPRESSOR) 50 MG tablet TAKE 1 AND 1/2 TABLETS TWICE DAILY 270 tablet 1  . pantoprazole (PROTONIX) 40 MG tablet     . rivaroxaban (XARELTO) 20 MG TABS tablet Take 1 tablet (20 mg total) by mouth daily with supper. 30 tablet 6   No current facility-administered medications for this visit.      Past Medical History:  Diagnosis Date  . AAA (abdominal aortic aneurysm) ([place])   . Anemia    iron deficiency  .  Ascending aorta dilatation (HCC)   . Atrial fibrillation ([place])    post op  . CAD (coronary artery disease)   . Carcinoma of lung ([place]) 08/28/2008   T2N0 moderately diff. squamous cell CA  . Cerebrovascular disease   . COPD (chronic obstructive pulmonary disease) ([place]) 2005   on CXR with R apical scaring  . Headache(784.0)    Hx: of migraines in the past  . Hypercholesterolemia    Hx: of  . Hypertension   . Incidental pulmonary nodule, > 63m and < 830m   LLL  . S/P aortic valve replacement 08/28/2008   owen  . S/P lobectomy of lung 08/28/2008   rt middle lobe  dr. owRoxy Manns. Shingles 11-09    Past Surgical History:  Procedure Laterality Date  . ABDOMINAL SURGERY    . AORTIC VALVE REPLACEMENT  [facility]/28/2008   [id number]  . APPENDECTOMY    . CARDIAC CATHETERIZATION    . CATARACT EXTRACTION W/ INTRAOCULAR LENS  IMPLANT, BILATERAL Bilateral   . CHOLECYSTECTOMY N/A 12/13/2013   Procedure: LAPAROSCOPIC CHOLECYSTECTOMY;  Surgeon: ArmRalene OkD;  [facility];  Laterality: N/A;  . COLONOSCOPY     \  . DILATION AND CURETTAGE OF UTERUS    . ERCP N/A 05/08/2015   Procedure: ENDOSCOPIC RETROGRADE CHOLANGIOPANCREATOGRAPHY (ERCP);  Surgeon: RobInda CastleD;  [facility];  Laterality: N/A;  . ERCP N/A 01/13/2016   Procedure:  ENDOSCOPIC RETROGRADE CHOLANGIOPANCREATOGRAPHY (ERCP);  Surgeon: Doran Stabler, MD;  [facility];  Service: Endoscopy;  Laterality: N/A;  . ERCP N/A 03/09/2016   Procedure: ENDOSCOPIC RETROGRADE CHOLANGIOPANCREATOGRAPHY (ERCP);  Surgeon: Ladene Artist, MD;  [facility];  Service: Endoscopy;  Laterality: N/A;  . RML removed Dr Ricard Dillon for lung cancer  08/28/2008   T2N0 squamous cell CA  . TONSILLECTOMY AND ADENOIDECTOMY    . u/s guided aspiration of R breast cyst   2009   at the breast clinic    Social History   Social History  . Marital status:  Single    Spouse name: N/A  . Number of children: N/A  . Years of education: N/A   Occupational History  . Not on file.   Social History Main Topics  . Smoking status: Former Smoker    Years: 40.00  . Smokeless tobacco: Never Used  . Alcohol use No  . Drug use: No  . Sexual activity: Not on file   Other Topics Concern  . Not on file   Social History Narrative  . No narrative on file    Family History  Problem Relation Age of Onset  . Stroke Father   . Diabetes Son     brittle diabetes  . Colon polyps Neg Hx   . Colon cancer Neg Hx   . Esophageal cancer Neg Hx   . Stomach cancer Neg Hx   . Pancreatic cancer Neg Hx   . Kidney disease Neg Hx   . Liver disease Neg Hx     ROS: no fevers or chills, productive cough, hemoptysis, dysphasia, odynophagia, melena, hematochezia, dysuria, hematuria, rash, seizure activity, orthopnea, PND, pedal edema, claudication. Remaining systems are negative.  Physical Exam: Well-developed well-nourished in no acute distress.  Skin is warm and dry.  HEENT is normal.  Neck is supple.  Chest is clear to auscultation with normal expansion.  Cardiovascular exam is regular rate and rhythm. 2/6 systolic murmur LSB Abdominal exam nontender or distended. No masses palpated. Extremities show no edema. neuro grossly intact  ECG-Sinus rhythm at a rate of 64. Left ventricular hypertrophy with repolarization abnormality. No change compared to previous  A/P  1Thoracic aortic aneurysm-follow-up CTA Oct 2018.  2 status post aortic valve replacement-continue SBE prophylaxis.  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  4 hypertension-blood pressure elevated but she follows and controlled at home. Continue present medications.  5 hyperlipidemia-patient is intolerant to statins. Continue diet.  6 coronary artery disease-intolerant to statins. No aspirin given need for anticoagulation.  7 carotid artery disease - Follow-up carotid Dopplers  December 2017.  8 abdominal aortic aneurysm-follow-up CTA Oct 2018.  9 Paroxysmal atrial fibrillation-patient remains in sinus rhythm. Continue beta blocker and Cardizem. Continue xarelto.  [name], MD

## 2016-10-21 ENCOUNTER — Ambulatory Visit (INDEPENDENT_AMBULATORY_CARE_PROVIDER_SITE_OTHER): Payer: Commercial Managed Care - HMO | Admitting: Cardiology

## 2016-10-21 ENCOUNTER — Encounter: Payer: Self-pay | Admitting: Cardiology

## 2016-10-21 VITALS — BP 151/83 | HR 64 | Ht 67.0 in | Wt 169.4 lb

## 2016-10-21 DIAGNOSIS — I714 Abdominal aortic aneurysm, without rupture, unspecified: Secondary | ICD-10-CM

## 2016-10-21 DIAGNOSIS — E7800 Pure hypercholesterolemia, unspecified: Secondary | ICD-10-CM | POA: Diagnosis not present

## 2016-10-21 DIAGNOSIS — I679 Cerebrovascular disease, unspecified: Secondary | ICD-10-CM | POA: Diagnosis not present

## 2016-10-21 DIAGNOSIS — I10 Essential (primary) hypertension: Secondary | ICD-10-CM | POA: Diagnosis not present

## 2016-10-21 DIAGNOSIS — I4891 Unspecified atrial fibrillation: Secondary | ICD-10-CM

## 2016-10-21 DIAGNOSIS — Z952 Presence of prosthetic heart valve: Secondary | ICD-10-CM | POA: Diagnosis not present

## 2016-10-21 DIAGNOSIS — I712 Thoracic aortic aneurysm, without rupture, unspecified: Secondary | ICD-10-CM

## 2016-10-21 NOTE — Patient Instructions (Signed)
Medication Instructions:   NO CHANGE  Testing/Procedures:  Your physician has requested that you have a carotid duplex. This test is an ultrasound of the carotid arteries in your neck. It looks at blood flow through these arteries that supply the brain with blood. Allow one hour for this exam. There are no restrictions or special instructions.    Follow-Up:  Your physician wants you to follow-up in: [place] will receive a reminder letter in the mail two months in advance. If you don't receive a letter, please call our office to schedule the follow-up appointment.   If you need a refill on your cardiac medications before your next appointment, please call your pharmacy.

## 2016-10-25 ENCOUNTER — Emergency Department (HOSPITAL_BASED_OUTPATIENT_CLINIC_OR_DEPARTMENT_OTHER): Payer: Commercial Managed Care - HMO

## 2016-10-25 ENCOUNTER — Emergency Department (HOSPITAL_BASED_OUTPATIENT_CLINIC_OR_DEPARTMENT_OTHER)
Admission: EM | Admit: 2016-10-25 | Discharge: 2016-10-25 | Disposition: A | Discharge status: Home / Self Care | Payer: Commercial Managed Care - HMO | Attending: Emergency Medicine | Admitting: Emergency Medicine

## 2016-10-25 ENCOUNTER — Encounter (HOSPITAL_BASED_OUTPATIENT_CLINIC_OR_DEPARTMENT_OTHER): Payer: Self-pay | Admitting: *Deleted

## 2016-10-25 DIAGNOSIS — H1030 Unspecified acute conjunctivitis, unspecified eye: Secondary | ICD-10-CM

## 2016-10-25 DIAGNOSIS — Z79899 Other long term (current) drug therapy: Secondary | ICD-10-CM | POA: Insufficient documentation

## 2016-10-25 DIAGNOSIS — R51 Headache: Secondary | ICD-10-CM | POA: Insufficient documentation

## 2016-10-25 DIAGNOSIS — J449 Chronic obstructive pulmonary disease, unspecified: Secondary | ICD-10-CM | POA: Insufficient documentation

## 2016-10-25 DIAGNOSIS — Z87891 Personal history of nicotine dependence: Secondary | ICD-10-CM | POA: Insufficient documentation

## 2016-10-25 DIAGNOSIS — Z85118 Personal history of other malignant neoplasm of bronchus and lung: Secondary | ICD-10-CM | POA: Diagnosis not present

## 2016-10-25 DIAGNOSIS — I10 Essential (primary) hypertension: Secondary | ICD-10-CM | POA: Diagnosis not present

## 2016-10-25 DIAGNOSIS — H1031 Unspecified acute conjunctivitis, right eye: Secondary | ICD-10-CM | POA: Insufficient documentation

## 2016-10-25 DIAGNOSIS — G44019 Episodic cluster headache, not intractable: Secondary | ICD-10-CM | POA: Diagnosis not present

## 2016-10-25 DIAGNOSIS — R519 Headache, unspecified: Secondary | ICD-10-CM

## 2016-10-25 DIAGNOSIS — H5711 Ocular pain, right eye: Secondary | ICD-10-CM | POA: Diagnosis present

## 2016-10-25 DIAGNOSIS — I2510 Atherosclerotic heart disease of native coronary artery without angina pectoris: Secondary | ICD-10-CM | POA: Diagnosis not present

## 2016-10-25 MED ORDER — MORPHINE SULFATE (PF) 4 MG/ML IV SOLN
4.0000 mg | Freq: Once | INTRAVENOUS | Status: AC
  Administered 2016-10-25: 4 mg via INTRAVENOUS
  Filled 2016-10-25: qty 1

## 2016-10-25 MED ORDER — MORPHINE SULFATE (PF) 4 MG/ML IV SOLN
INTRAVENOUS | Status: AC
  Filled 2016-10-25: qty 1

## 2016-10-25 MED ORDER — METOCLOPRAMIDE HCL 5 MG/ML IJ SOLN
10.0000 mg | Freq: Once | INTRAMUSCULAR | Status: AC
Start: 2016-10-25 — End: 2016-10-25
  Administered 2016-10-25: 10 mg via INTRAVENOUS
  Filled 2016-10-25: qty 2

## 2016-10-25 MED ORDER — ACETAMINOPHEN 500 MG PO TABS
1000.0000 mg | ORAL_TABLET | Freq: Once | ORAL | Status: AC
  Administered 2016-10-25: 1000 mg via ORAL
  Filled 2016-10-25: qty 2

## 2016-10-25 MED ORDER — SODIUM CHLORIDE 0.9 % IV BOLUS (SEPSIS)
500.0000 mL | Freq: Once | INTRAVENOUS | Status: AC
  Administered 2016-10-25: 500 mL via INTRAVENOUS

## 2016-10-25 MED ORDER — DIPHENHYDRAMINE HCL 50 MG/ML IJ SOLN
25.0000 mg | Freq: Once | INTRAMUSCULAR | Status: AC
  Administered 2016-10-25: 25 mg via INTRAVENOUS
  Filled 2016-10-25: qty 1

## 2016-10-25 MED ORDER — POLYMYXIN B-TRIMETHOPRIM 10000-0.1 UNIT/ML-% OP SOLN: 2.0000 [drp] | OPHTHALMIC | 0 refills | Status: DC

## 2016-10-25 NOTE — ED Notes (Signed)
Refuses visual acuity

## 2016-10-25 NOTE — ED Triage Notes (Signed)
Patient states she woke up this morning with a right sided headache, which is associated with nausea.   States her right eye sclera has been red for one week.  States she also has her chronic RUQ abdominal pain which no one can figure out what is wrong.  States she has had the same pain as today, since having her Gallbladder removed.

## 2016-10-25 NOTE — ED Provider Notes (Signed)
[name] DEPT [facility] Provider Note   CSN: [id number] Arrival date & time: 10/25/16  1002     History   Chief Complaint Chief Complaint  Patient presents with  . Headache    HPI Laurie [name] is a 81 y.o. female.  HPI  Pt presenting with mulitple complaints.  She has new onset headache which is in the right side and posterior of her head.  She noticed this gradually beginning after waking up this morning.  She is somewhat nauseated as well.  No fever/chills.  No changes in vision.  No focal numbness or weakness.  No changes in speech.  She states she occasionally gets headaches but usually they are not in this location.  She also notes that she has had redness and drainage from right eye for approx one week.  Eye feels irritated.  There are no other associated systemic symptoms, there are no other alleviating or modifying factors.   Past Medical History:  Diagnosis Date  . AAA (abdominal aortic aneurysm) ([place])   . Anemia    iron deficiency  . Ascending aorta dilatation (HCC)   . Atrial fibrillation ([place])    post op  . CAD (coronary artery disease)   . Carcinoma of lung ([place]) 08/28/2008   T2N0 moderately diff. squamous cell CA  . Cerebrovascular disease   . COPD (chronic obstructive pulmonary disease) ([place]) 2005   on CXR with R apical scaring  . Headache(784.0)    Hx: of migraines in the past  . Hypercholesterolemia    Hx: of  . Hypertension   . Incidental pulmonary nodule, > 368m and < 877m   LLL  . S/P aortic valve replacement 08/28/2008   Laurie [name]  . S/P lobectomy of lung 08/28/2008   rt middle lobe  dr. owRoxy Manns. Shingles 11-09    Patient Active Problem List   Diagnosis Date Noted  . Incidental pulmonary nodule, > 68m55mnd < 8mm24m. Claudication (HCC)[place]/24/2017  . Chronic obstructive pulmonary disease (HCC)[place]. Dyspnea   . Cholangitis   . Acute respiratory failure with hypoxia (HCC)[place]. Atrial fibrillation with RVR (HCC)[place]. HTN (hypertension)  03/09/2016  . Abdominal pain 03/09/2016  . COPD (chronic obstructive pulmonary disease) (HCC)[place]. RUQ abdominal pain   . Elevated LFTs   . Biliary calculus   . Pulmonary emphysema (HCC)[place]. Aortic valve disease   . Thoracic aortic aneurysm without rupture (HCC)[place]/19/2016  . Ascending aorta dilatation (HCC)   . Abdominal aortic aneurysm (HCC)[place]/26/2012  . HYPERGLYCEMIA, MILD 11/10/2010  . OSTEOPENIA 09/29/2010  . Coronary atherosclerosis 05/15/2009  . Bilateral carotid artery disease (HCC)[place]/16/2010  . TIA 05/15/2009  . ILIAC ARTERY ANEURYSM 05/15/2009  . GAD (generalized anxiety disorder) 03/20/2009  . THYROID NODULE 01/09/2009  . THYROMEGALY 01/08/2009  . SIALOLITHIASIS 01/08/2009  . MALIGNANT NEOPLASM MIDDLE LOBE BRONCHUS OR LUNG 10/24/2008  . HLD (hyperlipidemia) 10/24/2008  . AORTIC VALVE REPLACEMENT, HX OF 10/24/2008  . S/P aortic valve replacement 08/28/2008  . S/P lobectomy of lung 08/28/2008  . DIVERTICULOSIS OF COLON 06/19/2008  . ANEMIA, IRON DEFICIENCY 06/06/2008  . Essential hypertension, benign 06/06/2008  . GASTROINTESTINAL HEMORRHAGE, HX OF 06/06/2008    Past Surgical History:  Procedure Laterality Date  . ABDOMINAL SURGERY    . AORTIC VALVE REPLACEMENT  08/28/2008   [id number] pericardial tissue valve  . APPENDECTOMY    . CARDIAC CATHETERIZATION    .  CATARACT EXTRACTION W/ INTRAOCULAR LENS  IMPLANT, BILATERAL Bilateral   . CHOLECYSTECTOMY N/A 12/13/2013   Procedure: LAPAROSCOPIC CHOLECYSTECTOMY;  Surgeon: Ralene Ok, MD;  Location: [facility];  Service: General;  Laterality: N/A;  . COLONOSCOPY     \  . DILATION AND CURETTAGE OF UTERUS    . ERCP N/A 05/08/2015   Procedure: ENDOSCOPIC RETROGRADE CHOLANGIOPANCREATOGRAPHY (ERCP);  Surgeon: Inda Castle, MD;  Location: [facility];  Service: Endoscopy;  Laterality: N/A;  . ERCP N/A 01/13/2016   Procedure: ENDOSCOPIC RETROGRADE CHOLANGIOPANCREATOGRAPHY (ERCP);  Surgeon: Doran Stabler,  MD;  Location: [facility];  Service: Endoscopy;  Laterality: N/A;  . ERCP N/A 03/09/2016   Procedure: ENDOSCOPIC RETROGRADE CHOLANGIOPANCREATOGRAPHY (ERCP);  Surgeon: Ladene Artist, MD;  Location: [facility];  Service: Endoscopy;  Laterality: N/A;  . RML removed Dr Ricard Dillon for lung cancer  08/28/2008   T2N0 squamous cell CA  . TONSILLECTOMY AND ADENOIDECTOMY    . u/s guided aspiration of R breast cyst   2009   at the breast clinic    OB History    No data available       Home Medications    Prior to Admission medications   Medication Sig Start Date End Date Taking? Authorizing Provider  acetaminophen (TYLENOL) 500 MG tablet Take 500 mg by mouth every 6 (six) hours as needed for moderate pain, fever or headache. Pain     Historical Provider, MD  ALPRAZolam (XANAX) 0.5 MG tablet Take 1 tablet (0.5 mg total) by mouth 2 (two) times daily as needed for anxiety. 09/11/16   Hali Marry, MD  CARTIA XT 180 MG 24 hr capsule TAKE ONE CAPSULE BY MOUTH ONCE DAILY 07/28/16   Lelon Perla, MD  lisinopril (PRINIVIL,ZESTRIL) 40 MG tablet TAKE 1 TABLET EVERY DAY 07/15/16   Lelon Perla, MD  metoprolol (LOPRESSOR) 50 MG tablet TAKE 1 AND 1/2 TABLETS TWICE DAILY 07/15/16   Lelon Perla, MD  pantoprazole (PROTONIX) 40 MG tablet  09/02/16   Historical Provider, MD  rivaroxaban (XARELTO) 20 MG TABS tablet Take 1 tablet (20 mg total) by mouth daily with supper. 04/22/16   Lelon Perla, MD  trimethoprim-polymyxin b (POLYTRIM) ophthalmic solution Place 2 drops into the right eye every 4 (four) hours. 10/25/16   Alfonzo Beers, MD    Family History Family History  Problem Relation Age of Onset  . Stroke Father   . Diabetes Son     brittle diabetes  . Colon polyps Neg Hx   . Colon cancer Neg Hx   . Esophageal cancer Neg Hx   . Stomach cancer Neg Hx   . Pancreatic cancer Neg Hx   . Kidney disease Neg Hx   . Liver disease Neg Hx     Social History Social History  Substance Use  Topics  . Smoking status: Former Smoker    Years: 40.00  . Smokeless tobacco: Never Used  . Alcohol use No     Allergies   Rofecoxib; Hyoscyamine; and Statins   Review of Systems Review of Systems  ROS reviewed and all otherwise negative except for mentioned in HPI   Physical Exam Updated Vital Signs BP 130/68 (BP Location: Right Arm)   Pulse (!) 58   Temp 97.4 F (36.3 C) (Oral)   Resp 16   Ht '5\' 7"'$  (1.702 m)   Wt 169 lb (76.7 kg)   SpO2 91%   BMI 26.47 kg/m  Vitals reviewed Physical Exam  Physical  Examination: General appearance - alert, well appearing, and in no distress Mental status - alert, oriented to person, place, and time Eyes - pupils equal and reactive, extraocular eye movements intact, right conjunctival injection with small amount of prurulent drainage Mouth - mucous membranes moist, pharynx normal without lesions Neck - supple, no significant adenopathy, no midline tenderness Chest - clear to auscultation, no wheezes, rales or rhonchi, symmetric air entry Heart - normal rate, regular rhythm, normal S1, S2, no murmurs, rubs, clicks or gallops Abdomen - soft, nontender, nondistended, no masses or organomegaly Neurological - alert, oriented x 3, no cranial nerve defect, strength 5/5 in extremities x 4, sensation intact,  Extremities - peripheral pulses normal, no pedal edema, no clubbing or cyanosis Skin - normal coloration and turgor, no rashes   ED Treatments / Results  Labs (all labs ordered are listed, but only abnormal results are displayed) Labs Reviewed - No data to display  EKG  EKG Interpretation None       Radiology Ct Head Wo Contrast  Result Date: 10/25/2016 CLINICAL DATA:  Right-sided headache with nausea. EXAM: CT HEAD WITHOUT CONTRAST TECHNIQUE: Contiguous axial images were obtained from the base of the skull through the vertex without intravenous contrast. COMPARISON:  None. FINDINGS: Brain: There is mild generalized age related  parenchymal atrophy with commensurate dilatation of the ventricles and sulci. Minimal chronic small vessel ischemic change noted in the periventricular white matter. There is no mass, hemorrhage, edema or other evidence of acute parenchymal abnormality. No extra-axial hemorrhage. Vascular: No hyperdense vessel or unexpected calcification. Skull: Normal. Negative for fracture or focal lesion. Sinuses/Orbits: Visualized upper paranasal sinuses are clear. Periorbital and retro-orbital soft tissues are unremarkable. Other: None. IMPRESSION: No acute findings.  No intracranial mass, hemorrhage or edema. Electronically Signed   By: Franki Cabot M.D.   On: 10/25/2016 11:07    Procedures Procedures (including critical care time)  Medications Ordered in ED Medications  morphine 4 MG/ML injection (  Not Given 10/25/16 1139)  acetaminophen (TYLENOL) tablet 1,000 mg (1,000 mg Oral Given 10/25/16 1038)  sodium chloride 0.9 % bolus 500 mL (0 mLs Intravenous Stopped 10/25/16 1213)  morphine 4 MG/ML injection 4 mg (4 mg Intravenous Given 10/25/16 1109)  diphenhydrAMINE (BENADRYL) injection 25 mg (25 mg Intravenous Given 10/25/16 1109)  metoCLOPramide (REGLAN) injection 10 mg (10 mg Intravenous Given 10/25/16 1140)     Initial Impression / Assessment and Plan / ED Course  I have reviewed the triage vital signs and the nursing notes.  Pertinent labs & imaging results that were available during my care of the patient were reviewed by me and considered in my medical decision making (see chart for details).  Clinical Course   12:06 PM pt refuses to do the visual acuity screening- I have explained the reasoning for checking her vision, but she states she has had this done in the past and does not want to do it now.  Concerned that insurance will not pay for it.    Pt presenting with headache as well as redness in right eye.  No focal neurologic changes.  No fever, no vomiting.  Head CT reasssuring, headache  improved after meds.  She does not want to pursue the eye findings with visual acuity screening as above.  Pt given rx for polytrim drops.  Discharged with strict return precautions.  Pt agreeable with plan.  Final Clinical Impressions(s) / ED Diagnoses   Final diagnoses:  Nonintractable episodic headache, unspecified headache type  Acute  conjunctivitis, unspecified acute conjunctivitis type, unspecified laterality    New Prescriptions Discharge Medication List as of 10/25/2016 12:10 PM    START taking these medications   Details  trimethoprim-polymyxin b (POLYTRIM) ophthalmic solution Place 2 drops into the right eye every 4 (four) hours., Starting Sun 10/25/2016, Print         Alfonzo Beers, MD 10/25/16 1331

## 2016-10-25 NOTE — Discharge Instructions (Signed)
Return to the ED with any concerns including changes in vision, weakness of arms or legs, difficulty breathing swallowing or speaking, fainting, decreased level of alertness/lethargy, or any other alarming symptoms

## 2016-10-26 ENCOUNTER — Ambulatory Visit (HOSPITAL_BASED_OUTPATIENT_CLINIC_OR_DEPARTMENT_OTHER)
Admission: RE | Admit: 2016-10-26 | Discharge: 2016-10-26 | Disposition: A | Discharge status: Home / Self Care | Payer: Commercial Managed Care - HMO | Source: Ambulatory Visit | Attending: Cardiology | Admitting: Cardiology

## 2016-10-26 DIAGNOSIS — I679 Cerebrovascular disease, unspecified: Secondary | ICD-10-CM | POA: Diagnosis present

## 2016-10-26 DIAGNOSIS — I6523 Occlusion and stenosis of bilateral carotid arteries: Secondary | ICD-10-CM | POA: Diagnosis not present

## 2016-11-03 ENCOUNTER — Encounter (HOSPITAL_COMMUNITY): Payer: Self-pay | Admitting: Cardiology

## 2016-11-06 ENCOUNTER — Encounter: Payer: Self-pay | Admitting: Gastroenterology

## 2016-11-06 ENCOUNTER — Ambulatory Visit (INDEPENDENT_AMBULATORY_CARE_PROVIDER_SITE_OTHER): Payer: Commercial Managed Care - HMO | Admitting: Gastroenterology

## 2016-11-06 ENCOUNTER — Other Ambulatory Visit: Payer: Self-pay

## 2016-11-06 VITALS — BP 134/74 | HR 72 | Ht 66.0 in | Wt 168.4 lb

## 2016-11-06 DIAGNOSIS — R1011 Right upper quadrant pain: Secondary | ICD-10-CM | POA: Diagnosis not present

## 2016-11-06 MED ORDER — ACETAMINOPHEN-CODEINE #3 300-30 MG PO TABS: 1.0000 | ORAL_TABLET | Freq: Four times a day (QID) | ORAL | 0 refills | Status: DC | PRN

## 2016-11-06 NOTE — Progress Notes (Signed)
[facility] Progress Note  Chief Complaint: Epigastric and right upper quadrant pain  Subjective  History:  Laurie [name] follows up after ER visit in October. She had acute worsening of her chronic upper abdominal pain, LFTs were normal. Please see my prior notes for details, she is been complaining of upper abdominal pain ever since her cholecystectomy. It has been challenging because on 2 occasions, in February and April of this year, she had CBD stones with one episode of cholangitis. Liver, and both of those occasions, she had significantly elevated LFTs. She describes epigastric pain and a "knot" that comes and goes as before. She is also bothered by severe right hip pain for which she has seen primary care. Treatment plan is unclear.   ROS: Cardiovascular:  no chest pain Respiratory: no dyspnea  The patient's Past Medical, Family and Social History were reviewed and are on file in the EMR.  Objective:  Med list reviewed  Vital signs in last 24 hrs: Vitals:   11/06/16 1318  BP: 134/74  Pulse: 72    Physical Exam    HEENT: sclera anicteric, oral mucosa moist without lesions  Neck: supple, no thyromegaly, JVD or lymphadenopathy  Cardiac: RRR without murmurs, S1S2 heard, no peripheral edema  Pulm: clear to auscultation bilaterally, normal RR and effort noted  Abdomen: soft, Mild epigastric tenderness, with active bowel sounds. No guarding or palpable hepatosplenomegaly.  Skin; warm and dry, no jaundice or rash  Recent Labs:  Normal LFTs in October    '@ASSESSMENTPLANBEGIN'$ @ Assessment: Encounter Diagnosis  Name Primary?  . RUQ abdominal pain Yes    She has chronic abdominal pain ever since her cholecystectomy, it also appears there is an element of chronic pain syndrome. I do not think she has retained CBD stones at this point. She knows that if she gets fever and/or jaundice along with increased episode of pain she should present to the emergency  department.  Plan: I have given her 15 tablets of Tylenol 3 to hopefully keep her out of the emergency department and also help with her right hip pain. I encouraged her to seek primary care follow-up regarding chronic pain management.   Total time 20 minutes, over half spent in counseling and coordination of care.   [name]   CC: Beatrice Lecher, MD

## 2016-11-06 NOTE — Patient Instructions (Signed)
If you are age 79 or older, your body mass index should be between 23-30. Your Body mass index is 27.18 kg/m. If this is out of the aforementioned range listed, please consider follow up with your Primary Care Provider.  If you are age 33 or younger, your body mass index should be between 19-25. Your Body mass index is 27.18 kg/m. If this is out of the aformentioned range listed, please consider follow up with your Primary Care Provider.   Thank you for choosing [facility]  Dr Wilfrid Lund III

## 2016-11-16 ENCOUNTER — Encounter: Payer: Self-pay | Admitting: Family Medicine

## 2016-11-16 ENCOUNTER — Ambulatory Visit (INDEPENDENT_AMBULATORY_CARE_PROVIDER_SITE_OTHER): Payer: Commercial Managed Care - HMO | Admitting: Family Medicine

## 2016-11-16 VITALS — BP 141/71 | HR 68 | Temp 97.7°F | Wt 170.0 lb

## 2016-11-16 DIAGNOSIS — M25551 Pain in right hip: Secondary | ICD-10-CM

## 2016-11-16 DIAGNOSIS — G8929 Other chronic pain: Secondary | ICD-10-CM | POA: Diagnosis not present

## 2016-11-16 DIAGNOSIS — H10022 Other mucopurulent conjunctivitis, left eye: Secondary | ICD-10-CM

## 2016-11-16 DIAGNOSIS — R1011 Right upper quadrant pain: Secondary | ICD-10-CM | POA: Diagnosis not present

## 2016-11-16 MED ORDER — TRAMADOL HCL 50 MG PO TABS: 50.0000 mg | ORAL_TABLET | Freq: Two times a day (BID) | ORAL | 2 refills | Status: DC | PRN

## 2016-11-16 NOTE — Progress Notes (Signed)
Subjective:    Patient ID: Laurie [name], female    DOB: Sep 25, 1935, 81 y.o.   MRN: [id number]  HPI C/O right sided pelvic pain.  Prior hx of pain in the right buttock as well.  Worse to sit. Better with standing.  She says she feels like it's over the ischial bursa radiating into the groin crease. She denies any vaginal pain or bleeding. She denies any injury or fall.   Pink eye , left - she says it started in her right eye. Started using eye drops and it got better but now in the left eye.  Says wakes up and feels like her eye is crusted together.  No pain or fever.   Still having intermittant RUQ.  She says ever since she had the gallbladder stones removed she still having some intermittent discomfort. She has followed back up with the GI. He feels like she may end up having some chronic intermittent pain with that. Heating pad and she says it does seem to help. She says that he gave her some Tylenol with codeine but says it makes her constipated and she really doesn't like that side effect. She would rather have the tramadol. She says that he recommended that they come from [place] area  Review of Systems  BP (!) 141/71   Pulse 68   Temp 97.7 F (36.5 C) (Oral)   Wt 170 lb (77.1 kg)   BMI 27.44 kg/m     Allergies  Allergen Reactions  . Rofecoxib Anaphylaxis and Hives  . Hyoscyamine Other (See Comments)    Mental status changes.  (as of 02/13/16 patient denies any problems with this medication)  . Statins     Muscle aches    Past Medical History:  Diagnosis Date  . AAA (abdominal aortic aneurysm) ([place])   . Anemia    iron deficiency  . Ascending aorta dilatation (HCC)   . Atrial fibrillation ([place])    post op  . CAD (coronary artery disease)   . Carcinoma of lung ([place]) 08/28/2008   T2N0 moderately diff. squamous cell CA  . Cerebrovascular disease   . COPD (chronic obstructive pulmonary disease) ([place]) 2005   on CXR with R apical scaring  . Headache(784.0)    Hx: of  migraines in the past  . Hypercholesterolemia    Hx: of  . Hypertension   . Incidental pulmonary nodule, > 35m and < 843m   LLL  . S/P aortic valve replacement 08/28/2008   owen  . S/P lobectomy of lung 08/28/2008   rt middle lobe  dr. owRoxy Manns. Shingles 11-09    Past Surgical History:  Procedure Laterality Date  . ABDOMINAL SURGERY    . AORTIC VALVE REPLACEMENT  08/28/2008   [id number]ard[facility]  . APPENDECTOMY    . CARDIAC CATHETERIZATION    . CATARACT EXTRACTION W/ INTRAOCULAR LENS  IMPLANT, BILATERAL Bilateral   . CHOLECYSTECTOMY N/A 12/13/2013   Procedure: LAPAROSCOPIC CHOLECYSTECTOMY;  Surgeon: ArmRalene OkD;  [facility];  Laterality: N/A;  . COLONOSCOPY     \  . DILATION AND CURETTAGE OF UTERUS    . ERCP N/A 05/08/2015   Procedure: ENDOSCOPIC RETROGRADE CHOLANGIOPANCREATOGRAPHY (ERCP);  Surgeon: RobInda CastleD;  [facility];  Laterality: N/A;  . ERCP N/A 01/13/2016   Procedure: ENDOSCOPIC RETROGRADE CHOLANGIOPANCREATOGRAPHY (ERCP);  Surgeon: HenDoran StablerD;  [facility];  Service:  Endoscopy;  Laterality: N/A;  . ERCP N/A 03/09/2016   Procedure: ENDOSCOPIC RETROGRADE CHOLANGIOPANCREATOGRAPHY (ERCP);  Surgeon: Ladene Artist, MD;  Location: [facility];  Service: Endoscopy;  Laterality: N/A;  . RML removed Dr Ricard Dillon for lung cancer  08/28/2008   T2N0 squamous cell CA  . TONSILLECTOMY AND ADENOIDECTOMY    . u/s guided aspiration of R breast cyst   2009   at the breast clinic    Social History   Social History  . Marital status: Single    Spouse name: N/A  . Number of children: N/A  . Years of education: N/A   Occupational History  . Not on file.   Social History Main Topics  . Smoking status: Former Smoker    Years: 40.00  . Smokeless tobacco: Never Used  . Alcohol use No  . Drug use: No  . Sexual activity: Not on file   Other Topics Concern  . Not  on file   Social History Narrative  . No narrative on file    Family History  Problem Relation Age of Onset  . Stroke Father   . Diabetes Son     brittle diabetes  . Colon polyps Neg Hx   . Colon cancer Neg Hx   . Esophageal cancer Neg Hx   . Stomach cancer Neg Hx   . Pancreatic cancer Neg Hx   . Kidney disease Neg Hx   . Liver disease Neg Hx     Outpatient Encounter Prescriptions as of 11/16/2016  Medication Sig  . acetaminophen (TYLENOL) 500 MG tablet Take 500 mg by mouth every 6 (six) hours as needed for moderate pain, fever or headache. Pain   . ALPRAZolam (XANAX) 0.5 MG tablet Take 1 tablet (0.5 mg total) by mouth 2 (two) times daily as needed for anxiety. (Patient taking differently: Take 0.5 mg by mouth as needed for anxiety. )  . CARTIA XT 180 MG 24 hr capsule TAKE ONE CAPSULE BY MOUTH ONCE DAILY  . lisinopril (PRINIVIL,ZESTRIL) 40 MG tablet TAKE 1 TABLET EVERY DAY  . metoprolol (LOPRESSOR) 50 MG tablet TAKE 1 AND 1/2 TABLETS TWICE DAILY  . rivaroxaban (XARELTO) 20 MG TABS tablet Take 1 tablet (20 mg total) by mouth daily with supper.  . traMADol (ULTRAM) 50 MG tablet Take 1 tablet (50 mg total) by mouth 2 (two) times daily as needed.  . [DISCONTINUED] acetaminophen-codeine (TYLENOL #3) 300-30 MG tablet Take 1 tablet by mouth every 6 (six) hours as needed for moderate pain.  . [DISCONTINUED] trimethoprim-polymyxin b (POLYTRIM) ophthalmic solution Place 2 drops into the right eye every 4 (four) hours.   No facility-administered encounter medications on file as of 11/16/2016.          Objective:   Physical Exam  Constitutional: She is oriented to person, place, and time. She appears well-developed and well-nourished.  HENT:  Head: Normocephalic and atraumatic.  Left sclera is injected. EOMI, discharged on exam today.  Eyes: Conjunctivae and EOM are normal.  Cardiovascular: Normal rate.   Pulmonary/Chest: Effort normal.  Musculoskeletal:  Right hip with normal  flexion and extension. She did have some pain with internal and external rotation. They both time she pointed towards the if she'll bursa as her source of pain. Tender directly over the if she'll bursa. Nontender over the right groin crease. Nontender over the trochanteric bursa. Strength is 5 out of 5 in both hips knees and ankles.  Neurological: She is alert and oriented to person, place, and  time.  Skin: Skin is dry. No pallor.  Psychiatric: She has a normal mood and affect. Her behavior is normal.  Vitals reviewed.      Assessment & Plan:  Right hip pain - She complains of pelvic pain but I really believe it's the right hip. Offered to get an x-ray today for further evaluation for degree of arthritis and to rule out fracture. That she has no prior history of trauma. I do think she might benefit from some physical therapy versus an injection. She declined both of those today and said she will let me know. I did give her prescription for tramadol to use as needed.  Pink, eye, left  - she did say she had some drops left over from using her right eye so encouraged her to start using them in her left eye. We can always refill it if she runs out. If it's not improving then she needs to follow-up with her eye doctor.  Recurrent right upper quadrant pain-is been recurrent she had a cholecystectomy. And use tramadol as needed. Try to use sparingly.

## 2016-11-20 ENCOUNTER — Telehealth: Payer: Self-pay | Admitting: *Deleted

## 2016-11-20 MED ORDER — METOPROLOL TARTRATE 50 MG PO TABS: 100.0000 mg | ORAL_TABLET | Freq: Two times a day (BID) | ORAL | 1 refills | Status: DC

## 2016-11-20 NOTE — Telephone Encounter (Signed)
Spoke with pt, she called to report that her bp and pulse has been high. She can only tell me that the bp was 200 on the top and her heart rate was 140. She would like for her bp medications to be adjusted. She reports 4 episodes of elevated bp and pulse this week. She reports he pulse does not get below 60. Advised patient to increase the metoprolol to 100 mg twice daily. Patient voiced understanding to continue to monitor and call if continues to have problems.

## 2016-12-02 ENCOUNTER — Telehealth: Payer: Self-pay | Admitting: Cardiology

## 2016-12-02 NOTE — Telephone Encounter (Signed)
New message     Pt states her HR is really low--in the 50's. Pt is lightheaded.  She thinks one of her medications is "not working".  Please call

## 2016-12-02 NOTE — Telephone Encounter (Signed)
Spoke with pt, she reports since the increase in the metoprolol her heart rate is running 40's to 50's. She feels she maybe in atrial fib but can not tell. She also reports feeling lightheaded most of the time after getting out of bed. She has decreased the metoprolol to 75 mg twice daily, since then her bp is 137/67 with pulse of 60. She reports feeling lightheaded while sitting still in the chair, she has no feelings of being off balance. She reports just feeling bad, she would like to see dr Stanford Breed to discuss her medications. Follow up scheduled

## 2016-12-03 ENCOUNTER — Other Ambulatory Visit: Payer: Self-pay | Admitting: *Deleted

## 2016-12-03 MED ORDER — RIVAROXABAN 20 MG PO TABS: 20.0000 mg | ORAL_TABLET | Freq: Every day | ORAL | 3 refills | Status: DC

## 2016-12-03 NOTE — Telephone Encounter (Signed)
Paperwork for xarelto assistance mailed to patient

## 2016-12-03 NOTE — Progress Notes (Signed)
HPI: FU atrial fibrillation, CAD, ho aortic stenosis status post aortic valve replacement in September 2009. She had a pericardial tissue valve. She also had a septal myectomy and a right middle lobectomy due to lung CA. Note, she had a 50% mid LAD by catheterization preoperatively. Echocardiogram April 2017 showed normal LV function, grade 2 diastolic dysfunction, prior aortic valve replacement with mean gradient 6 mmHg. Patient developed atrial fibrillation following gallbladder surgery previously. Abdominal CT October 2017 showed 3.9 cm infrarenal abdominal aortic aneurysm. There was a 4 cm aneurysm of the distal descending thoracic aorta. Carotid Dopplers November 2017 showed mild bilateral atherosclerosis and no hemodynamically significant stenosis. Since I last saw her, there is no dyspnea, chest pain, palpitations or syncope. She states after her a.m. medications she feels weak. She feels her heart rate may be too slow.  Current Outpatient Prescriptions  Medication Sig Dispense Refill  . acetaminophen (TYLENOL) 500 MG tablet Take 500 mg by mouth every 6 (six) hours as needed for moderate pain, fever or headache. Pain     . ALPRAZolam (XANAX) 0.5 MG tablet Take 1 tablet (0.5 mg total) by mouth 2 (two) times daily as needed for anxiety. (Patient taking differently: Take 0.5 mg by mouth as needed for anxiety. ) 60 tablet 4  . CARTIA XT 180 MG 24 hr capsule TAKE ONE CAPSULE BY MOUTH ONCE DAILY 30 capsule 6  . lisinopril (PRINIVIL,ZESTRIL) 40 MG tablet TAKE 1 TABLET EVERY DAY 90 tablet 1  . metoprolol (LOPRESSOR) 50 MG tablet Take 2 tablets (100 mg total) by mouth 2 (two) times daily. (Patient taking differently: Take 75 mg by mouth 2 (two) times daily. ) 270 tablet 1  . rivaroxaban (XARELTO) 20 MG TABS tablet Take 1 tablet (20 mg total) by mouth daily with supper. 90 tablet 3  . traMADol (ULTRAM) 50 MG tablet Take 1 tablet (50 mg total) by mouth 2 (two) times daily as needed. 60 tablet 2    No current facility-administered medications for this visit.      Past Medical History:  Diagnosis Date  . AAA (abdominal aortic aneurysm) ([place])   . Anemia    iron deficiency  . Ascending aorta dilatation ([facility])   . Atrial fibrillation ([place])    post op  . CAD (coronary artery disease)   . Carcinoma of lung ([place]) 08/28/2008   T2N0 moderately diff. squamous cell CA  . Cerebrovascular disease   . COPD (chronic obstructive pulmonary disease) ([place]) 2005   on CXR with R apical scaring  . Headache(784.0)    Hx: of migraines in the past  . Hypercholesterolemia    Hx: of  . Hypertension   . Incidental pulmonary nodule, > 24m and < 830m   LLL  . S/P aortic valve replacement 08/28/2008   owen  . S/P lobectomy of lung 08/28/2008   rt middle lobe  dr. owRoxy Manns. Shingles 11-09    Past Surgical History:  Procedure Laterality Date  . ABDOMINAL SURGERY    . AORTIC VALVE [facility]  [facility]   [id number]  . APPENDECTOMY    . CARDIAC CATHETERIZATION    . CATARACT EXTRACTION W/ INTRAOCULAR LENS  IMPLANT, BILATERAL Bilateral   . CHOLECYSTECTOMY N/A 12/13/2013   Procedure: LAPAROSCOPIC CHOLECYSTECTOMY;  Surgeon: ArmRalene OkD;  [facility]  Laterality: N/A;  . COLONOSCOPY     \  . DILATION AND CURETTAGE OF UTERUS    .  ERCP N/A 05/08/2015   Procedure: ENDOSCOPIC RETROGRADE CHOLANGIOPANCREATOGRAPHY (ERCP);  Surgeon: Inda Castle, MD;  Location: [place];  Service: Endoscopy;  Laterality: N/A;  . ERCP N/A 01/13/2016   Procedure: ENDOSCOPIC RETROGRADE CHOLANGIOPANCREATOGRAPHY (ERCP);  Surgeon: Doran Stabler, MD;  [facility];  Service: Endoscopy;  Laterality: N/A;  . ERCP N/A 03/09/2016   Procedure: ENDOSCOPIC RETROGRADE CHOLANGIOPANCREATOGRAPHY (ERCP);  Surgeon: Ladene Artist, MD;  Location[facility];  Service: Endoscopy;  Laterality: N/A;  . RML removed Dr Ricard Dillon for lung cancer  08/28/2008    T2N0 squamous cell CA  . TONSILLECTOMY AND ADENOIDECTOMY    . u/s guided aspiration of R breast cyst   2009   at the breast clinic    Social History   Social History  . Marital status: Single    Spouse name: N/A  . Number of children: N/A  . Years of education: N/A   Occupational History  . Not on file.   Social History Main Topics  . Smoking status: Former Smoker    Years: 40.00  . Smokeless tobacco: Never Used  . Alcohol use No  . Drug use: No  . Sexual activity: Not on file   Other Topics Concern  . Not on file   Social History Narrative  . No narrative on file    Family History  Problem Relation Age of Onset  . Stroke Father   . Diabetes Son     brittle diabetes  . Colon polyps Neg Hx   . Colon cancer Neg Hx   . Esophageal cancer Neg Hx   . Stomach cancer Neg Hx   . Pancreatic cancer Neg Hx   . Kidney disease Neg Hx   . Liver disease Neg Hx     ROS: no fevers or chills, productive cough, hemoptysis, dysphasia, odynophagia, melena, hematochezia, dysuria, hematuria, rash, seizure activity, orthopnea, PND, pedal edema, claudication. Remaining systems are negative.  Physical Exam: Well-developed well-nourished in no acute distress.  Skin is warm and dry.  HEENT is normal.  Neck is supple.  Chest is clear to auscultation with normal expansion.  Cardiovascular exam is regular rate and rhythm. 2/6 systolic murmur LSB Abdominal exam nontender or distended. No masses palpated. Extremities show no edema. neuro grossly intact  ECG Sinus rhythm at a rate of 59. Normal axis. Cannot rule out prior septal infarct. Left ventricular hypertrophy with repolarization abnormality.  A/P  1Thoracic aortic aneurysm-follow-up CTA Oct 2018.  2 status post aortic valve replacement-continue SBE prophylaxis.  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  4 hypertension-blood pressure elevated and she is describing some weakness and bradycardia in the morning. I will  continue metoprolol and discontinue Cardizem. Add amlodipine 5 mg daily. Hopefully higher heart rate will improve her symptoms. Follow blood pressure and adjust regimen as needed.  5 hyperlipidemia-patient is intolerant to statins. Continue diet.  6 coronary artery disease-intolerant to statins. No aspirin given need for anticoagulation.  7 carotid artery disease - Mild on most recent dopplers.  8 abdominal aortic aneurysm-follow-up CTA Oct 2018.  9 Paroxysmal atrial fibrillation-patient remains in sinus rhythm. Continue beta blocker and Cardizem. Continue xarelto.  [name], MD  [name], MD

## 2016-12-09 ENCOUNTER — Encounter: Payer: Self-pay | Admitting: Cardiology

## 2016-12-09 ENCOUNTER — Ambulatory Visit (INDEPENDENT_AMBULATORY_CARE_PROVIDER_SITE_OTHER): Payer: Commercial Managed Care - HMO | Admitting: Cardiology

## 2016-12-09 VITALS — BP 144/80 | HR 59 | Ht 67.0 in | Wt 171.0 lb

## 2016-12-09 DIAGNOSIS — I10 Essential (primary) hypertension: Secondary | ICD-10-CM

## 2016-12-09 DIAGNOSIS — I714 Abdominal aortic aneurysm, without rupture, unspecified: Secondary | ICD-10-CM

## 2016-12-09 DIAGNOSIS — Z952 Presence of prosthetic heart valve: Secondary | ICD-10-CM | POA: Diagnosis not present

## 2016-12-09 DIAGNOSIS — I4891 Unspecified atrial fibrillation: Secondary | ICD-10-CM | POA: Diagnosis not present

## 2016-12-09 MED ORDER — AMLODIPINE BESYLATE 5 MG PO TABS: 5.0000 mg | ORAL_TABLET | Freq: Every day | ORAL | 3 refills | Status: DC

## 2016-12-09 NOTE — Patient Instructions (Signed)
Medication Instructions:   STOP CARTIA  START AMLODIPINE 5 MG ONCE DAILY  Follow-Up:  Your physician wants you to follow-up in: 3 [place] will receive a reminder letter in the mail two months in advance. If you don't receive a letter, please call our office to schedule the follow-up appointment.

## 2016-12-11 ENCOUNTER — Encounter: Payer: Self-pay | Admitting: Gastroenterology

## 2016-12-11 DIAGNOSIS — H903 Sensorineural hearing loss, bilateral: Secondary | ICD-10-CM | POA: Diagnosis not present

## 2016-12-18 ENCOUNTER — Ambulatory Visit (INDEPENDENT_AMBULATORY_CARE_PROVIDER_SITE_OTHER): Payer: Commercial Managed Care - HMO

## 2016-12-18 ENCOUNTER — Encounter: Payer: Self-pay | Admitting: Family Medicine

## 2016-12-18 ENCOUNTER — Ambulatory Visit (INDEPENDENT_AMBULATORY_CARE_PROVIDER_SITE_OTHER): Payer: Commercial Managed Care - HMO | Admitting: Family Medicine

## 2016-12-18 VITALS — BP 140/72 | HR 77 | Ht 67.0 in | Wt 176.0 lb

## 2016-12-18 DIAGNOSIS — S299XXA Unspecified injury of thorax, initial encounter: Secondary | ICD-10-CM | POA: Diagnosis not present

## 2016-12-18 DIAGNOSIS — M1611 Unilateral primary osteoarthritis, right hip: Secondary | ICD-10-CM | POA: Diagnosis not present

## 2016-12-18 DIAGNOSIS — R1031 Right lower quadrant pain: Secondary | ICD-10-CM | POA: Diagnosis not present

## 2016-12-18 DIAGNOSIS — M8588 Other specified disorders of bone density and structure, other site: Secondary | ICD-10-CM | POA: Diagnosis not present

## 2016-12-18 DIAGNOSIS — R0781 Pleurodynia: Secondary | ICD-10-CM

## 2016-12-18 MED ORDER — HYDROCODONE-ACETAMINOPHEN 5-325 MG PO TABS: 1.0000 | ORAL_TABLET | Freq: Four times a day (QID) | ORAL | 0 refills | Status: DC | PRN

## 2016-12-18 NOTE — Progress Notes (Addendum)
Subjective:    CC:   HPI: 82 year old female comes in today complaining of Right groin crease pain and she says she feels a long oval-shaped lump. She's concerned that she may have a groin hernia.  She was actually here 4 weeks ago complaining of right hip pain. At that time she says it started in the buttock area and radiated into the groin crease. At that time we discussed getting an x-ray but she declined and just wanted to try the tramadol for pain to see if it got better on its own. Today it seems to be more in the anterior groin. She says it's painful when she tries to lift or abduct that hip. She denies any known injury trauma to the area. She's been using Tylenol and heat on it.  She does want me to look at her right lower ribs as well. Her son's dog came in and around into her and pushed her against the wall with her with a light switch. She feels like this which actually puncture on the right lower ribs and now it's painful to take a deep breath.   Past medical history, Surgical history, Family history not pertinant except as noted below, Social history, Allergies, and medications have been entered into the medical record, reviewed, and corrections made.   Review of Systems: No fevers, chills, night sweats, weight loss, chest pain, or shortness of breath.   Objective:    General: Well Developed, well nourished, and in no acute distress.  Neuro: Alert and oriented x3, extra-ocular muscles intact, sensation grossly intact.  HEENT: Normocephalic, atraumatic  Skin: Warm and dry, no rashes. Cardiac: Regular rate and rhythm, no murmurs rubs or gallops, no lower extremity edema.  Respiratory: Clear to auscultation bilaterally. Not using accessory muscles, speaking in full sentences. Chest: No bruising over the wall. But she is tender over the right lower ribs. Ext: Refill a palpable lump at the right groin crease. Unable to tell if it's true herniation or not. Hip flexion and extension is 5  out of 5 bilaterally. Good strength with abduction and abduction though she did get pain in the groin with abduction with resistance.    Impression and Recommendations:    Right groin pain - We'll start with plain film x-ray as well as ultrasound of the groin. She could certainly have a hernia which could certainly be causing her pain and discomfort. We'll see if we can get evidence of this on the ultrasound for confirmation if not making it to more sophisticated imaging if needed. Because the tramadol is not helping we'll switch to hydrocodone temporarily. Given a 5 day supply and check the [place] registry to make sure that there was no other aberrant use of narcotics.   Right rib pain-we'll get x-ray just to rule out fractures she is having discomfort with taking a deep breath.

## 2016-12-21 ENCOUNTER — Other Ambulatory Visit: Payer: Self-pay | Admitting: Cardiology

## 2016-12-21 ENCOUNTER — Other Ambulatory Visit: Payer: Commercial Managed Care - HMO

## 2016-12-21 ENCOUNTER — Telehealth: Payer: Self-pay | Admitting: *Deleted

## 2016-12-21 DIAGNOSIS — R1031 Right lower quadrant pain: Secondary | ICD-10-CM

## 2016-12-21 NOTE — Telephone Encounter (Signed)
Changed order per imaging to more specific order

## 2017-01-08 ENCOUNTER — Encounter: Payer: Self-pay | Admitting: *Deleted

## 2017-01-08 ENCOUNTER — Emergency Department
Admission: EM | Admit: 2017-01-08 | Discharge: 2017-01-08 | Disposition: A | Discharge status: Home / Self Care | Payer: Commercial Managed Care - HMO | Source: Home / Self Care | Attending: Family Medicine | Admitting: Family Medicine

## 2017-01-08 DIAGNOSIS — H9203 Otalgia, bilateral: Secondary | ICD-10-CM

## 2017-01-08 DIAGNOSIS — R51 Headache: Secondary | ICD-10-CM

## 2017-01-08 DIAGNOSIS — R519 Headache, unspecified: Secondary | ICD-10-CM

## 2017-01-08 DIAGNOSIS — R220 Localized swelling, mass and lump, head: Secondary | ICD-10-CM

## 2017-01-08 MED ORDER — CEFDINIR 300 MG PO CAPS: 300.0000 mg | ORAL_CAPSULE | Freq: Two times a day (BID) | ORAL | 0 refills | Status: DC

## 2017-01-08 NOTE — ED Provider Notes (Signed)
CSN: [id number]     Arrival date & time 01/08/17  1158 History   First MD Initiated Contact with Patient 01/08/17 1227     Chief Complaint  Patient presents with  . Headache  . Facial Swelling   (Consider location/radiation/quality/duration/timing/severity/associated sxs/prior Treatment) HPI  Laurie [name] is a 82 y.o. female presenting to UC with c/o 3 days of generalized head pressure, "roaring" in her ears and bilateral ear pain.  She also reports noticing a small area of redness and swelling on Left side of face that started yesterday. Denies pain or itching to the area.  Denies fever, sore throat, cough, n/v/d. Denies dizziness or change in vision. She notes she was recently fitted for hearing aids prior to symptoms starting but she had to take them out due to the headache and sound in her ears.    Past Medical History:  Diagnosis Date  . AAA (abdominal aortic aneurysm) ([place])   . Anemia    iron deficiency  . Ascending aorta dilatation ([facility])   . Atrial fibrillation ([place])    post op  . CAD (coronary artery disease)   . Carcinoma of lung ([place]) 08/28/2008   T2N0 moderately diff. squamous cell CA  . Cerebrovascular disease   . COPD (chronic obstructive pulmonary disease) ([place]) 2005   on CXR with R apical scaring  . Headache(784.0)    Hx: of migraines in the past  . Hypercholesterolemia    Hx: of  . Hypertension   . Incidental pulmonary nodule, > 81m and < 897m   LLL  . S/P aortic valve replacement 08/28/2008   owen  . S/P lobectomy of lung 08/28/2008   rt middle lobe  dr. owRoxy Manns. Shingles 11-09   Past Surgical History:  Procedure Laterality Date  . ABDOMINAL SURGERY    . AORTIC VALVE REPLACEMENT  [facility]28[facility]   [id number]  . APPENDECTOMY    . CARDIAC CATHETERIZATION    . CATARACT EXTRACTION W/ INTRAOCULAR LENS  IMPLANT, BILATERAL Bilateral   . CHOLECYSTECTOMY N/A 12/13/2013   Procedure: LAPAROSCOPIC CHOLECYSTECTOMY;  Surgeon:  ArmRalene OkD;  [facility];  Laterality: N/A;  . COLONOSCOPY     \  . DILATION AND CURETTAGE OF UTERUS    . ERCP N/A 05/08/2015   Procedure: ENDOSCOPIC RETROGRADE CHOLANGIOPANCREATOGRAPHY (ERCP);  Surgeon: RobInda CastleD;  [facility];  Laterality: N/A;  . ERCP N/A 01/13/2016   Procedure: ENDOSCOPIC RETROGRADE CHOLANGIOPANCREATOGRAPHY (ERCP);  Surgeon: HenDoran StablerD;  [facility];  Service: Endoscopy;  Laterality: N/A;  . ERCP N/A 03/09/2016   Procedure: ENDOSCOPIC RETROGRADE CHOLANGIOPANCREATOGRAPHY (ERCP);  Surgeon: MalLadene ArtistD;  [facility];  Service: Endoscopy;  Laterality: N/A;  . RML removed Dr OweRicard Dillonr lung cancer  08/28/2008   T2N0 squamous cell CA  . TONSILLECTOMY AND ADENOIDECTOMY    . u/s guided aspiration of R breast cyst   2009   at the breast clinic   Family History  Problem Relation Age of Onset  . Stroke Father   . Diabetes Son     brittle diabetes  . Colon polyps Neg Hx   . Colon cancer Neg Hx   . Esophageal cancer Neg Hx   . Stomach cancer Neg Hx   . Pancreatic cancer Neg Hx   . Kidney disease Neg Hx   . Liver disease Neg Hx    Social History  Substance Use Topics  . Smoking status: Former Smoker    Years: 40.00  . Smokeless tobacco: Never Used  . Alcohol use No   OB History    No data available     Review of Systems  Constitutional: Negative for chills and fever.  HENT: Positive for congestion, ear pain and facial swelling. Negative for sore throat, trouble swallowing and voice change.   Respiratory: Negative for cough and shortness of breath.   Cardiovascular: Negative for chest pain and palpitations.  Gastrointestinal: Negative for abdominal pain, diarrhea, nausea and vomiting.  Musculoskeletal: Negative for arthralgias, back pain and myalgias.  Skin: Negative for rash.  Neurological: Positive for headaches. Negative for dizziness and  light-headedness.    Allergies  Rofecoxib; Hyoscyamine; and Statins  Home Medications   Prior to Admission medications   Medication Sig Start Date End Date Taking? Authorizing Provider  acetaminophen (TYLENOL) 500 MG tablet Take 500 mg by mouth every 6 (six) hours as needed for moderate pain, fever or headache. Pain     Historical Provider, MD  ALPRAZolam (XANAX) 0.5 MG tablet Take 1 tablet (0.5 mg total) by mouth 2 (two) times daily as needed for anxiety. Patient taking differently: Take 0.5 mg by mouth as needed for anxiety.  09/11/16   Hali Marry, MD  amLODipine (NORVASC) 5 MG tablet Take 1 tablet (5 mg total) by mouth daily. 12/09/16 03/09/17  Lelon Perla, MD  cefdinir (OMNICEF) 300 MG capsule Take 1 capsule (300 mg total) by mouth 2 (two) times daily. 01/08/17   Noland Fordyce, PA-C  HYDROcodone-acetaminophen (NORCO/VICODIN) 5-325 MG tablet Take 1 tablet by mouth every 6 (six) hours as needed for moderate pain. 12/18/16   Hali Marry, MD  lisinopril (PRINIVIL,ZESTRIL) 40 MG tablet TAKE 1 TABLET EVERY DAY 12/21/16   Lelon Perla, MD  metoprolol (LOPRESSOR) 50 MG tablet TAKE 1 AND 1/2 TABLETS TWICE DAILY 12/21/16   Lelon Perla, MD  rivaroxaban (XARELTO) 20 MG TABS tablet Take 1 tablet (20 mg total) by mouth daily with supper. 12/03/16   Lelon Perla, MD   Meds Ordered and Administered this Visit  Medications - No data to display  BP 118/71 (BP Location: Left Arm)   Pulse 74   Temp 98.2 F (36.8 C) (Oral)   Resp 18   Ht '5\' 6"'$  (1.676 m)   Wt 171 lb (77.6 kg)   SpO2 98%   BMI 27.60 kg/m  No data found.   Physical Exam  Constitutional: She is oriented to person, place, and time. She appears well-developed and well-nourished. No distress.  HENT:  Head: Normocephalic and atraumatic.    Right Ear: Tympanic membrane is injected. Tympanic membrane is not erythematous and not bulging. A middle ear effusion is present.  Left Ear: Tympanic membrane is not  erythematous and not bulging. A middle ear effusion is present.  Nose: Nose normal.  Mouth/Throat: Uvula is midline, oropharynx is clear and moist and mucous membranes are normal.  1cm area of erythema with mild induration. Non-tender. No bleeding or discharge. No fluctuance.  Eyes: EOM are normal. Pupils are equal, round, and reactive to light.  Neck: Normal range of motion. Neck supple.  Cardiovascular: Normal rate and regular rhythm.   Pulmonary/Chest: Effort normal. No stridor. No respiratory distress. She has no wheezes. She has no rales.  Musculoskeletal: Normal range of motion.  Lymphadenopathy:    She has no cervical adenopathy.  Neurological: She is alert and oriented to person, place,  and time. No cranial nerve deficit.  Skin: Skin is warm and dry. She is not diaphoretic.  Psychiatric: She has a normal mood and affect. Her behavior is normal.  Nursing note and vitals reviewed.   Urgent Care Course     Procedures (including critical care time)  Labs Review Labs Reviewed - No data to display  Imaging Review No results found.  Tympanometry: unable to get a clear reading in ears.  MDM   1. Acute ear pain, bilateral   2. Facial swelling   3. Generalized headache    Pt c/o 3 days of mild congestion, generalized headache, and bilateral ear pain. Redness with mild swelling on Left side of face.   Will treat for potential bacterial infection- Cefdinir  F/u with PCP next week if not improving.  Sooner if worsening.     Noland Fordyce, PA-C 01/08/17 1315

## 2017-01-08 NOTE — ED Triage Notes (Signed)
Pt c/o head pressure and a roaring sound in her ear bilaterally x 3 days. She also reports a red spot on her LT cheek with a lump under it x 1 day. Denies fever, nasal congestion, or cough.

## 2017-01-08 NOTE — Discharge Instructions (Signed)
°  The red spot on your face with small amount of swelling is concerning for early cellulitis.  The antibiotic you will be taking should help with ear infection and skin infection.

## 2017-01-14 ENCOUNTER — Ambulatory Visit (INDEPENDENT_AMBULATORY_CARE_PROVIDER_SITE_OTHER): Payer: Commercial Managed Care - HMO | Admitting: Family Medicine

## 2017-01-14 ENCOUNTER — Encounter: Payer: Self-pay | Admitting: Family Medicine

## 2017-01-14 VITALS — BP 158/87 | HR 70 | Temp 98.7°F | Ht 66.0 in | Wt 174.0 lb

## 2017-01-14 DIAGNOSIS — R519 Headache, unspecified: Secondary | ICD-10-CM

## 2017-01-14 DIAGNOSIS — R220 Localized swelling, mass and lump, head: Secondary | ICD-10-CM

## 2017-01-14 DIAGNOSIS — R51 Headache: Secondary | ICD-10-CM

## 2017-01-14 DIAGNOSIS — M542 Cervicalgia: Secondary | ICD-10-CM | POA: Diagnosis not present

## 2017-01-14 NOTE — Progress Notes (Signed)
Subjective:    Patient ID: Laurie [name], female    DOB: May 11, 1935, 82 y.o.   MRN: [id number]  HPI 82 year old female comes in today because she is still not feeling well after recent urgent care visit. She was seen in her urgent care here at the med center in [place] approximately 6 days ago for 3 days of symptoms including headache, bilateral ear pain, and rushing noise in her ears. She also had a small area of redness and swelling on the left side of her face. She was treated with Cefdinir and told to follow-up if her symptoms did not improve within a week.  She says she really doesn't feel any better but she also doesn't feel worse. She says the redness on the left facial cheek has improved but still feel swollen to her. In fact she says both temples are painful and it radiates behind her ears and then down into the posterior occiput area. She actually feels like her left temple and face feel numb. And she says that her neck has been a little bit stiff at times and hard to turn to does feel a little better today. No fevers chills or sweats. She's not had any significant nasal congestion. No sore throat and no cough. She feels like she has had some vision in the last month.  She had she had cataract surgery so just uses glasses for reading but is noticed on the television at their small types growing across the bottom that she's had this when at times.  Review of Systems  BP (!) 158/87   Pulse 70   Temp 98.7 F (37.1 C)   Ht '5\' 6"'$  (1.676 m)   Wt 174 lb (78.9 kg)   SpO2 95%   BMI 28.08 kg/m     Allergies  Allergen Reactions  . Rofecoxib Anaphylaxis and Hives  . Hyoscyamine Other (See Comments)    Mental status changes.  (as of 02/13/16 patient denies any problems with this medication)  . Statins     Muscle aches    Past Medical History:  Diagnosis Date  . AAA (abdominal aortic aneurysm) ([facility])   . Anemia    iron deficiency  . Ascending aorta dilatation ([facility])   .  Atrial fibrillation ([place])    post op  . CAD (coronary artery disease)   . Carcinoma of lung ([facility]) 08/28/2008   T2N0 moderately diff. squamous cell CA  . Cerebrovascular disease   . COPD (chronic obstructive pulmonary disease) ([place]) 2005   on CXR with R apical scaring  . Headache(784.0)    Hx: of migraines in the past  . Hypercholesterolemia    Hx: of  . Hypertension   . Incidental pulmonary nodule, > 21m and < 853m   LLL  . S/P aortic valve replacement 08/28/2008   [facility]  . S/P lobectomy of lung 08/28/2008   rt middle lobe  dr. owRoxy Manns. Shingles 11-09    Past Surgical History:  Procedure Laterality Date  . ABDOMINAL SURGERY    . AORTIC VALVE REPLACEMENT  08/28/2008   [id number]  . APPENDECTOMY    . CARDIAC CATHETERIZATION    . CATARACT EXTRACTION W/ INTRAOCULAR LENS  IMPLANT, BILATERAL Bilateral   . CHOLECYSTECTOMY N/A 12/13/2013   Procedure: LAPAROSCOPIC CHOLECYSTECTOMY;  Surgeon: ArmRalene OkD;  [facility];  Laterality: N/A;  . COLONOSCOPY     \  . DILATION AND CURETTAGE OF  UTERUS    . ERCP N/A 05/08/2015   Procedure: ENDOSCOPIC RETROGRADE CHOLANGIOPANCREATOGRAPHY (ERCP);  Surgeon: Inda Castle, MD;  Location: [facility];  Service: Endoscopy;  Laterality: N/A;  . ERCP N/A 01/13/2016   Procedure: ENDOSCOPIC RETROGRADE CHOLANGIOPANCREATOGRAPHY (ERCP);  Surgeon: Doran Stabler, MD;  Location: [facility];  Service: Endoscopy;  Laterality: N/A;  . ERCP N/A 03/09/2016   Procedure: ENDOSCOPIC RETROGRADE CHOLANGIOPANCREATOGRAPHY (ERCP);  Surgeon: Ladene Artist, MD;  Location: [facility];  Service: Endoscopy;  Laterality: N/A;  . RML removed Dr Ricard Dillon for lung cancer  08/28/2008   T2N0 squamous cell CA  . TONSILLECTOMY AND ADENOIDECTOMY    . u/s guided aspiration of R breast cyst   2009   at the breast clinic    Social History   Social History  . Marital status: Single    Spouse name: N/A  .  Number of children: N/A  . Years of education: N/A   Occupational History  . Not on file.   Social History Main Topics  . Smoking status: Former Smoker    Years: 40.00  . Smokeless tobacco: Never Used  . Alcohol use No  . Drug use: No  . Sexual activity: Not on file   Other Topics Concern  . Not on file   Social History Narrative  . No narrative on file    Family History  Problem Relation Age of Onset  . Stroke Father   . Diabetes Son     brittle diabetes  . Colon polyps Neg Hx   . Colon cancer Neg Hx   . Esophageal cancer Neg Hx   . Stomach cancer Neg Hx   . Pancreatic cancer Neg Hx   . Kidney disease Neg Hx   . Liver disease Neg Hx     Outpatient Encounter Prescriptions as of 01/14/2017  Medication Sig  . acetaminophen (TYLENOL) 500 MG tablet Take 500 mg by mouth every 6 (six) hours as needed for moderate pain, fever or headache. Pain   . ALPRAZolam (XANAX) 0.5 MG tablet Take 1 tablet (0.5 mg total) by mouth 2 (two) times daily as needed for anxiety. (Patient taking differently: Take 0.5 mg by mouth as needed for anxiety. )  . amLODipine (NORVASC) 5 MG tablet Take 1 tablet (5 mg total) by mouth daily.  . cefdinir (OMNICEF) 300 MG capsule Take 1 capsule (300 mg total) by mouth 2 (two) times daily.  Marland Kitchen HYDROcodone-acetaminophen (NORCO/VICODIN) 5-325 MG tablet Take 1 tablet by mouth every 6 (six) hours as needed for moderate pain.  Marland Kitchen lisinopril (PRINIVIL,ZESTRIL) 40 MG tablet TAKE 1 TABLET EVERY DAY  . metoprolol (LOPRESSOR) 50 MG tablet TAKE 1 AND 1/2 TABLETS TWICE DAILY  . rivaroxaban (XARELTO) 20 MG TABS tablet Take 1 tablet (20 mg total) by mouth daily with supper.  . traMADol (ULTRAM) 50 MG tablet TAKE 1 TAB BY MOUTH 2 TIMES DAILY AS NEEDED   No facility-administered encounter medications on file as of 01/14/2017.          Objective:   Physical Exam  Constitutional: She is oriented to person, place, and time. She appears well-developed and well-nourished.   HENT:  Head: Normocephalic and atraumatic.  Right Ear: External ear normal.  Left Ear: External ear normal.  Nose: Nose normal.  Mouth/Throat: Oropharynx is clear and moist.  TMs and canals are clear. She has some mild swelling over the left maxillary facial area. No erythema.  Eyes: Conjunctivae and EOM are normal. Pupils are equal,  round, and reactive to light.  Neck: Neck supple. No thyromegaly present.  Cardiovascular: Normal rate, regular rhythm and normal heart sounds.   Pulmonary/Chest: Effort normal and breath sounds normal. She has no wheezes.  Lymphadenopathy:    She has no cervical adenopathy.  Neurological: She is alert and oriented to person, place, and time.  Skin: Skin is warm and dry.  Psychiatric: She has a normal mood and affect.        Assessment & Plan:  Temple pain-consider temporal arteritis on the differential diagnosis. We'll get a sedimentation rate and a CRP. Consider infection such as sinusitis that she really doesn't have a lot of nasal congestion and her symptoms are bilateral which is a little unusual but will check a CBC with differential. I did offer to do an eye chart exam today because of recent vision changes but she declined.  Neck pain and stiffness-check she feels like that the little bit better today. Encouraged her to continue to work on gentle stretching and using a heating pad as needed  Left facial swelling-she still has just a little bit of swelling of the left facial cheek but no actual erythema and she's nontender on exam over that location but consider maxillary sinusitis. I don't see any evidence of facial cellulitis. If lab work is normal and she's not feeling better consider treating for sinusitis and would change antibiotics.

## 2017-01-15 ENCOUNTER — Ambulatory Visit: Payer: Commercial Managed Care - HMO | Admitting: Family Medicine

## 2017-01-15 LAB — CBC WITH DIFFERENTIAL/PLATELET
BASOS PCT: 1 %
Basophils Absolute: 83 cells/uL (ref 0–200)
EOS PCT: 3 %
Eosinophils Absolute: 249 cells/uL (ref 15–500)
HEMATOCRIT: 46.1 % — AB (ref 35.0–45.0)
HEMOGLOBIN: 15.6 g/dL — AB (ref 11.7–15.5)
LYMPHS ABS: 2075 {cells}/uL (ref 850–3900)
Lymphocytes Relative: 25 %
MCH: 29.9 pg (ref 27.0–33.0)
MCHC: 33.8 g/dL (ref 32.0–36.0)
MCV: 88.5 fL (ref 80.0–100.0)
MONO ABS: 913 {cells}/uL (ref 200–950)
MPV: 10.2 fL (ref 7.5–12.5)
Monocytes Relative: 11 %
NEUTROS ABS: 4980 {cells}/uL (ref 1500–7800)
NEUTROS PCT: 60 %
Platelets: 256 10*3/uL (ref 140–400)
RBC: 5.21 MIL/uL — AB (ref 3.80–5.10)
RDW: 13.3 % (ref 11.0–15.0)
WBC: 8.3 10*3/uL (ref 3.8–10.8)

## 2017-01-15 LAB — C-REACTIVE PROTEIN: CRP: 3.3 mg/L (ref <8.0)

## 2017-01-15 LAB — SEDIMENTATION RATE: Sed Rate: 6 mm/hr (ref 0–30)

## 2017-01-19 ENCOUNTER — Other Ambulatory Visit: Payer: Self-pay | Admitting: *Deleted

## 2017-01-19 ENCOUNTER — Telehealth: Payer: Self-pay | Admitting: Cardiology

## 2017-01-19 DIAGNOSIS — R911 Solitary pulmonary nodule: Secondary | ICD-10-CM

## 2017-01-19 NOTE — Telephone Encounter (Signed)
New message    Pt calling about pain from her head to her neck. She states she has been to PCP and urgent care.  She says she feels a numbing feeling in her jaw and some dizziness. She is requesting a call from RN.

## 2017-01-19 NOTE — Telephone Encounter (Signed)
Returned call to patient-patient states she has been having neck pain and head "pain" x several weeks and has been to Urgent Care and PCP for this and is not "getting answers" (see UC note as well as PCP note).   Patient requesting to see if Dr. Stanford Breed had any recommendations for a new primary doctor within [facility] and if we could refer her.  Patient states she knows this is not our "speciality" but  has a lot of respect for Dr. Stanford Breed and his Nurse Hilda Blades and would like their recommendations if they have any.  Advised I would route to MD and primary RN for  recommendations for PCP but encouraged patient to follow up with current PCP regarding ongoing symptoms (pt had lab work, PCP reached out to her for follow up and patient reports she was not going to call them back).    Patient verbalized understanding.

## 2017-01-21 NOTE — Telephone Encounter (Signed)
Left message for patient, dr copeland at the [facility] is accepting new patients and she can change from [place] to [place] without a problem. She will call back with other concerns.

## 2017-02-15 ENCOUNTER — Telehealth: Payer: Self-pay | Admitting: Family Medicine

## 2017-02-18 ENCOUNTER — Other Ambulatory Visit: Payer: Self-pay | Admitting: Thoracic Surgery (Cardiothoracic Vascular Surgery)

## 2017-02-22 ENCOUNTER — Inpatient Hospital Stay: Admission: RE | Admit: 2017-02-22 | Payer: Commercial Managed Care - HMO | Source: Ambulatory Visit

## 2017-02-22 ENCOUNTER — Ambulatory Visit: Payer: Medicare HMO | Admitting: Thoracic Surgery (Cardiothoracic Vascular Surgery)

## 2017-02-23 ENCOUNTER — Emergency Department (HOSPITAL_COMMUNITY)
Admission: EM | Admit: 2017-02-23 | Discharge: 2017-02-23 | Disposition: A | Discharge status: Home / Self Care | Payer: Medicare HMO | Attending: Emergency Medicine | Admitting: Emergency Medicine

## 2017-02-23 ENCOUNTER — Emergency Department (HOSPITAL_COMMUNITY): Payer: Medicare HMO

## 2017-02-23 ENCOUNTER — Encounter (HOSPITAL_COMMUNITY): Payer: Self-pay

## 2017-02-23 DIAGNOSIS — I2510 Atherosclerotic heart disease of native coronary artery without angina pectoris: Secondary | ICD-10-CM | POA: Diagnosis not present

## 2017-02-23 DIAGNOSIS — I4891 Unspecified atrial fibrillation: Secondary | ICD-10-CM | POA: Insufficient documentation

## 2017-02-23 DIAGNOSIS — R000 Tachycardia, unspecified: Secondary | ICD-10-CM | POA: Diagnosis not present

## 2017-02-23 DIAGNOSIS — Z8673 Personal history of transient ischemic attack (TIA), and cerebral infarction without residual deficits: Secondary | ICD-10-CM | POA: Diagnosis not present

## 2017-02-23 DIAGNOSIS — I10 Essential (primary) hypertension: Secondary | ICD-10-CM | POA: Diagnosis not present

## 2017-02-23 DIAGNOSIS — Z87891 Personal history of nicotine dependence: Secondary | ICD-10-CM | POA: Diagnosis not present

## 2017-02-23 DIAGNOSIS — Z7901 Long term (current) use of anticoagulants: Secondary | ICD-10-CM | POA: Insufficient documentation

## 2017-02-23 DIAGNOSIS — J90 Pleural effusion, not elsewhere classified: Secondary | ICD-10-CM | POA: Diagnosis not present

## 2017-02-23 DIAGNOSIS — Z79899 Other long term (current) drug therapy: Secondary | ICD-10-CM | POA: Diagnosis not present

## 2017-02-23 DIAGNOSIS — J449 Chronic obstructive pulmonary disease, unspecified: Secondary | ICD-10-CM | POA: Diagnosis not present

## 2017-02-23 DIAGNOSIS — R079 Chest pain, unspecified: Secondary | ICD-10-CM | POA: Diagnosis present

## 2017-02-23 LAB — URINALYSIS, ROUTINE W REFLEX MICROSCOPIC
BACTERIA UA: NONE SEEN
Bilirubin Urine: NEGATIVE
GLUCOSE, UA: NEGATIVE mg/dL
Hgb urine dipstick: NEGATIVE
Ketones, ur: NEGATIVE mg/dL
Leukocytes, UA: NEGATIVE
NITRITE: NEGATIVE
PROTEIN: 30 mg/dL — AB
Specific Gravity, Urine: 1.012 (ref 1.005–1.030)
pH: 6 (ref 5.0–8.0)

## 2017-02-23 LAB — I-STAT TROPONIN, ED
TROPONIN I, POC: 0.01 ng/mL (ref 0.00–0.08)
TROPONIN I, POC: 0.02 ng/mL (ref 0.00–0.08)

## 2017-02-23 LAB — CBC WITH DIFFERENTIAL/PLATELET
BASOS ABS: 0 10*3/uL (ref 0.0–0.1)
BASOS PCT: 0 %
EOS ABS: 0.1 10*3/uL (ref 0.0–0.7)
Eosinophils Relative: 1 %
HCT: 50.2 % — ABNORMAL HIGH (ref 36.0–46.0)
Hemoglobin: 17 g/dL — ABNORMAL HIGH (ref 12.0–15.0)
LYMPHS ABS: 1.8 10*3/uL (ref 0.7–4.0)
Lymphocytes Relative: 16 %
MCH: 30.6 pg (ref 26.0–34.0)
MCHC: 33.9 g/dL (ref 30.0–36.0)
MCV: 90.3 fL (ref 78.0–100.0)
MONOS PCT: 11 %
Monocytes Absolute: 1.3 10*3/uL — ABNORMAL HIGH (ref 0.1–1.0)
NEUTROS ABS: 7.9 10*3/uL — AB (ref 1.7–7.7)
NEUTROS PCT: 72 %
Platelets: 292 10*3/uL (ref 150–400)
RBC: 5.56 MIL/uL — AB (ref 3.87–5.11)
RDW: 13.2 % (ref 11.5–15.5)
WBC: 11.1 10*3/uL — ABNORMAL HIGH (ref 4.0–10.5)

## 2017-02-23 LAB — COMPREHENSIVE METABOLIC PANEL
ALBUMIN: 4.2 g/dL (ref 3.5–5.0)
ALK PHOS: 82 U/L (ref 38–126)
ALT: 23 U/L (ref 14–54)
ANION GAP: 11 (ref 5–15)
AST: 29 U/L (ref 15–41)
BILIRUBIN TOTAL: 1.6 mg/dL — AB (ref 0.3–1.2)
BUN: 9 mg/dL (ref 6–20)
CALCIUM: 9.3 mg/dL (ref 8.9–10.3)
CO2: 26 mmol/L (ref 22–32)
Chloride: 94 mmol/L — ABNORMAL LOW (ref 101–111)
Creatinine, Ser: 0.85 mg/dL (ref 0.44–1.00)
GFR calc non Af Amer: >60 mL/min (ref >60)
GLUCOSE: 124 mg/dL — AB (ref 65–99)
Potassium: 4.2 mmol/L (ref 3.5–5.1)
Sodium: 131 mmol/L — ABNORMAL LOW (ref 135–145)
TOTAL PROTEIN: 7.2 g/dL (ref 6.5–8.1)

## 2017-02-23 LAB — MAGNESIUM: Magnesium: 2 mg/dL (ref 1.7–2.4)

## 2017-02-23 MED ORDER — DILTIAZEM LOAD VIA INFUSION
10.0000 mg | Freq: Once | INTRAVENOUS | Status: AC
  Administered 2017-02-23: 10 mg via INTRAVENOUS
  Filled 2017-02-23: qty 10

## 2017-02-23 MED ORDER — AMIODARONE HCL IN DEXTROSE 360-4.14 MG/200ML-% IV SOLN: 30.0000 mg/h | INTRAVENOUS | Status: DC

## 2017-02-23 MED ORDER — DILTIAZEM HCL-DEXTROSE 100-5 MG/100ML-% IV SOLN (PREMIX)
5.0000 mg/h | INTRAVENOUS | Status: DC
  Administered 2017-02-23: 5 mg/h via INTRAVENOUS
  Filled 2017-02-23: qty 100

## 2017-02-23 MED ORDER — FENTANYL CITRATE (PF) 100 MCG/2ML IJ SOLN
INTRAMUSCULAR | Status: DC | PRN
  Administered 2017-02-23: 50 ug via INTRAVENOUS

## 2017-02-23 MED ORDER — PHENYLEPHRINE 40 MCG/ML (10ML) SYRINGE FOR IV PUSH (FOR BLOOD PRESSURE SUPPORT)
PREFILLED_SYRINGE | INTRAVENOUS | Status: AC
  Filled 2017-02-23: qty 10

## 2017-02-23 MED ORDER — AMIODARONE LOAD VIA INFUSION
150.0000 mg | Freq: Once | INTRAVENOUS | Status: DC
  Filled 2017-02-23: qty 83.34

## 2017-02-23 MED ORDER — SODIUM CHLORIDE 0.9 % IV SOLN
Freq: Once | INTRAVENOUS | Status: AC
  Administered 2017-02-23: 1000 mL via INTRAVENOUS

## 2017-02-23 MED ORDER — ETOMIDATE 2 MG/ML IV SOLN
INTRAVENOUS | Status: DC | PRN
  Administered 2017-02-23: 7 mg via INTRAVENOUS

## 2017-02-23 MED ORDER — FENTANYL CITRATE (PF) 100 MCG/2ML IJ SOLN
50.0000 ug | Freq: Once | INTRAMUSCULAR | Status: DC
  Filled 2017-02-23: qty 2

## 2017-02-23 MED ORDER — ETOMIDATE 2 MG/ML IV SOLN
7.0000 mg | Freq: Once | INTRAVENOUS | Status: DC
  Filled 2017-02-23: qty 10

## 2017-02-23 MED ORDER — AMIODARONE HCL IN DEXTROSE 360-4.14 MG/200ML-% IV SOLN: 60.0000 mg/h | INTRAVENOUS | Status: DC

## 2017-02-23 NOTE — ED Provider Notes (Signed)
[facility] DEPT Provider Note   CSN: [id number] Arrival date & time: 02/23/17  7169     History   Chief Complaint Chief Complaint  Patient presents with  . Chest Pain  . Atrial Fibrillation    HPI Laurie [name] is a 82 y.o. female.  HPI   Waking up at 4AM to use the restroom, developed burning left sided chest pain 10/10, and palpitations or feeling of heart racing.  Used CVS blood pressure cuff which showed 678 systolic BP and 938 heart rate. Took metoprolol and lisinopril. Had some symptomatic relief temporarily. Denied other associated symptoms. No confusion, no slurred speech.  Denies SOB. No recent illness, caffeine use.   Past Medical History:  Diagnosis Date  . AAA (abdominal aortic aneurysm) ([place])   . Anemia    iron deficiency  . Ascending aorta dilatation (HCC)   . Atrial fibrillation ([place])    post op  . CAD (coronary artery disease)   . Carcinoma of lung ([place]) 08/28/2008   T2N0 moderately diff. squamous cell CA  . Cerebrovascular disease   . COPD (chronic obstructive pulmonary disease) ([place]) 2005   on CXR with R apical scaring  . Headache(784.0)    Hx: of migraines in the past  . Hypercholesterolemia    Hx: of  . Hypertension   . Incidental pulmonary nodule, > 84m and < 842m   LLL  . S/P aortic valve replacement 08/28/2008   owen  . S/P lobectomy of lung 08/28/2008   rt middle lobe  dr. owRoxy Manns. Shingles 11-09    Patient Active Problem List   Diagnosis Date Noted  . Incidental pulmonary nodule, > 87m58mnd < 8mm37m. Claudication (HCC)[place]/24/2017  . Chronic obstructive pulmonary disease (HCC)[place]. Dyspnea   . Acute respiratory failure with hypoxia (HCC)[place]. Atrial fibrillation with RVR (HCC)[place]. HTN (hypertension) 03/09/2016  . COPD (chronic obstructive pulmonary disease) (HCC)[place]. RUQ abdominal pain   . Biliary calculus   . Pulmonary emphysema (HCC)[place]. Aortic valve disease   . Thoracic aortic aneurysm without rupture (HCC)[place]/19/2016    . Ascending aorta dilatation (HCC)   . Abdominal aortic aneurysm (HCC)[place]/26/2012  . HYPERGLYCEMIA, MILD 11/10/2010  . OSTEOPENIA 09/29/2010  . Coronary atherosclerosis 05/15/2009  . Bilateral carotid artery disease (HCC)[place]/16/2010  . TIA 05/15/2009  . ILIAC ARTERY ANEURYSM 05/15/2009  . GAD (generalized anxiety disorder) 03/20/2009  . THYROID NODULE 01/09/2009  . THYROMEGALY 01/08/2009  . SIALOLITHIASIS 01/08/2009  . MALIGNANT NEOPLASM MIDDLE LOBE BRONCHUS OR LUNG 10/24/2008  . HLD (hyperlipidemia) 10/24/2008  . AORTIC VALVE REPLACEMENT, HX OF 10/24/2008  . S/P aortic valve replacement 08/28/2008  . DIVERTICULOSIS OF COLON 06/19/2008  . ANEMIA, IRON DEFICIENCY 06/06/2008  . Essential hypertension, benign 06/06/2008  . GASTROINTESTINAL HEMORRHAGE, HX OF 06/06/2008    Past Surgical History:  Procedure Laterality Date  . ABDOMINAL SURGERY    . AORTIC VALVE REPLACEMENT  08/28/2008   [id number]  . APPENDECTOMY    . CARDIAC CATHETERIZATION    . CATARACT EXTRACTION W/ INTRAOCULAR LENS  IMPLANT, BILATERAL Bilateral   . CHOLECYSTECTOMY N/A 12/13/2013   Procedure: LAPAROSCOPIC CHOLECYSTECTOMY;  Surgeon: ArmanRalene Ok  [facility];  Laterality: N/A;  . COLONOSCOPY     \  . DILATION AND CURETTAGE OF UTERUS    . ERCP N/A 05/08/2015   Procedure:  ENDOSCOPIC RETROGRADE CHOLANGIOPANCREATOGRAPHY (ERCP);  Surgeon: Inda Castle, MD;  Location: [name];  Service: Endoscopy;  Laterality: N/A;  . ERCP N/A 01/13/2016   Procedure: ENDOSCOPIC RETROGRADE CHOLANGIOPANCREATOGRAPHY (ERCP);  Surgeon: Doran Stabler, MD;  Location: [facility];  Service: Endoscopy;  Laterality: N/A;  . ERCP N/A 03/09/2016   Procedure: ENDOSCOPIC RETROGRADE CHOLANGIOPANCREATOGRAPHY (ERCP);  Surgeon: Ladene Artist, MD;  Location: [facility];  Service: Endoscopy;  Laterality: N/A;  . RML removed Dr Ricard Dillon for lung cancer  08/28/2008   T2N0  squamous cell CA  . TONSILLECTOMY AND ADENOIDECTOMY    . u/s guided aspiration of R breast cyst   2009   at the breast clinic    OB History    No data available       Home Medications    Prior to Admission medications   Medication Sig Start Date End Date Taking? Authorizing Provider  acetaminophen (TYLENOL) 500 MG tablet Take 500 mg by mouth every 6 (six) hours as needed for moderate pain, fever or headache. Pain    Yes Historical Provider, MD  ALPRAZolam (XANAX) 0.5 MG tablet Take 1 tablet (0.5 mg total) by mouth 2 (two) times daily as needed for anxiety. Patient taking differently: Take 0.5 mg by mouth as needed for anxiety.  09/11/16  Yes Hali Marry, MD  amLODipine (NORVASC) 5 MG tablet Take 1 tablet (5 mg total) by mouth daily. 12/09/16 03/09/17 Yes Lelon Perla, MD  lisinopril (PRINIVIL,ZESTRIL) 40 MG tablet TAKE 1 TABLET EVERY DAY 12/21/16  Yes Lelon Perla, MD  metoprolol (LOPRESSOR) 50 MG tablet TAKE 1 AND 1/2 TABLETS TWICE DAILY 12/21/16  Yes Lelon Perla, MD  rivaroxaban (XARELTO) 20 MG TABS tablet Take 1 tablet (20 mg total) by mouth daily with supper. 12/03/16  Yes Lelon Perla, MD  cefdinir (OMNICEF) 300 MG capsule Take 1 capsule (300 mg total) by mouth 2 (two) times daily. Patient not taking: Reported on 02/23/2017 01/08/17   Noland Fordyce, PA-C  HYDROcodone-acetaminophen (NORCO/VICODIN) 5-325 MG tablet Take 1 tablet by mouth every 6 (six) hours as needed for moderate pain. Patient not taking: Reported on 02/23/2017 12/18/16   Hali Marry, MD    Family History Family History  Problem Relation Age of Onset  . Stroke Father   . Diabetes Son     brittle diabetes  . Colon polyps Neg Hx   . Colon cancer Neg Hx   . Esophageal cancer Neg Hx   . Stomach cancer Neg Hx   . Pancreatic cancer Neg Hx   . Kidney disease Neg Hx   . Liver disease Neg Hx     Social History Social History  Substance Use Topics  . Smoking status: Former Smoker     Years: 40.00  . Smokeless tobacco: Never Used  . Alcohol use No     Allergies   Hyoscyamine; Rofecoxib; and Statins   Review of Systems Review of Systems  Constitutional: Negative for fever.  HENT: Negative for sore throat.   Eyes: Negative for visual disturbance.  Respiratory: Negative for cough and shortness of breath.   Cardiovascular: Positive for chest pain and palpitations.  Gastrointestinal: Negative for abdominal pain, nausea and vomiting.  Genitourinary: Negative for difficulty urinating.  Musculoskeletal: Negative for back pain and neck pain.  Skin: Negative for rash.  Neurological: Negative for syncope and headaches.     Physical Exam Updated Vital Signs BP (!) 117/55 (BP Location: Left Arm)   Pulse 61  Temp 98.1 F (36.7 C) (Oral)   Resp 18   SpO2 92%   Physical Exam  Constitutional: She is oriented to person, place, and time. She appears well-developed and well-nourished. No distress.  HENT:  Head: Normocephalic and atraumatic.  Eyes: Conjunctivae and EOM are normal.  Neck: Normal range of motion.  Cardiovascular: Normal heart sounds and intact distal pulses.  An irregularly irregular rhythm present. Tachycardia present.  Exam reveals no gallop and no friction rub.   No murmur heard. Pulmonary/Chest: Effort normal and breath sounds normal. No respiratory distress. She has no wheezes. She has no rales.  Abdominal: Soft. She exhibits no distension. There is no tenderness. There is no guarding.  Musculoskeletal: She exhibits no edema or tenderness.  Neurological: She is alert and oriented to person, place, and time.  Skin: Skin is warm and dry. No rash noted. She is not diaphoretic. No erythema.  Nursing note and vitals reviewed.    ED Treatments / Results  Labs (all labs ordered are listed, but only abnormal results are displayed) Labs Reviewed  CBC WITH DIFFERENTIAL/PLATELET - Abnormal; Notable for the following:       Result Value   WBC 11.1 (*)     RBC 5.56 (*)    Hemoglobin 17.0 (*)    HCT 50.2 (*)    Neutro Abs 7.9 (*)    Monocytes Absolute 1.3 (*)    All other components within normal limits  COMPREHENSIVE METABOLIC PANEL - Abnormal; Notable for the following:    Sodium 131 (*)    Chloride 94 (*)    Glucose, Bld 124 (*)    Total Bilirubin 1.6 (*)    All other components within normal limits  URINALYSIS, ROUTINE W REFLEX MICROSCOPIC - Abnormal; Notable for the following:    Protein, ur 30 (*)    Squamous Epithelial / LPF 0-5 (*)    All other components within normal limits  MAGNESIUM  I-STAT TROPOININ, ED  I-STAT TROPOININ, ED    EKG  EKG Interpretation  Date/Time:  Tuesday February 23 2017 07:35:20 EDT Ventricular Rate:  132 PR Interval:    QRS Duration: 88 QT Interval:  320 QTC Calculation: 475 R Axis:   54 Text Interpretation:  Atrial fibrillation Probable anterior infarct, age indeterminate Since prior ECG, patient is now in atrial fibrillation, rate has increase TW changes similar to prior Confirmed by [facility] MD, [place] ([id number]) on 02/23/2017 7:46:13 AM       Radiology Dg Chest Portable 1 View  Result Date: 02/23/2017 CLINICAL DATA:  Tachycardia. EXAM: PORTABLE CHEST 1 VIEW COMPARISON:  03/10/2016 . FINDINGS: Prior cardiac valve replaced. Cardiomegaly with diffuse bilateral from interstitial prominence, improved from prior exam. Spot costophrenic angles not completely imaged. Small right pleural effusion cannot be excluded, this appears to be improved from prior exam. IMPRESSION: Prior cardiac valve replacement. Interim improvement congestive heart failure with mild residual interstitial edema small right pleural effusion. Electronically Signed   By: Marcello Moores  Register   On: 02/23/2017 08:45    Procedures .Cardioversion Date/Time: 02/23/2017 8:45 PM Performed by: Gareth Morgan Authorized by: Gareth Morgan   Consent:    Consent obtained:  Written   Consent given by:  Patient   Risks discussed:  Death,  induced arrhythmia and pain   Alternatives discussed:  Alternative treatment and rate-control medication Pre-procedure details:    Cardioversion basis:  Emergent   Rhythm:  Atrial fibrillation   Electrode placement:  Anterior-posterior Attempt one:    Cardioversion mode:  Synchronous  Shock (Joules):  150   Shock outcome:  Conversion to normal sinus rhythm Post-procedure details:    Patient status:  Awake   Patient tolerance of procedure:  Tolerated well, no immediate complications .Sedation Date/Time: 02/23/2017 8:46 PM Performed by: Gareth Morgan Authorized by: Gareth Morgan   Consent:    Consent obtained:  Written   Consent given by:  Patient   Risks discussed:  Allergic reaction, dysrhythmia, inadequate sedation, vomiting, respiratory compromise necessitating ventilatory assistance and intubation and prolonged hypoxia resulting in organ damage Universal protocol:    Patient identity confirmation method:  Arm band and verbally with patient Indications:    Procedure performed:  Cardioversion   Procedure necessitating sedation performed by:  Physician performing sedation   Intended level of sedation:  Moderate (conscious sedation) Pre-sedation assessment:    ASA classification: class 3 - patient with severe systemic disease     Neck mobility: normal     Mouth opening:  3 or more finger widths   Mallampati score:  II - soft palate, uvula, fauces visible   Pre-sedation assessments completed and reviewed: airway patency, cardiovascular function, hydration status, mental status, nausea/vomiting and pain level     History of difficult intubation: no     Pre-sedation assessment completed:  02/23/2017 10:22 AM Immediate pre-procedure details:    Reassessment: Patient reassessed immediately prior to procedure     Reviewed: vital signs and relevant labs/tests     Verified: bag valve mask available, emergency equipment available, intubation equipment available, IV patency  confirmed, oxygen available and suction available   Procedure details (see MAR for exact dosages):    Sedation start time:  02/23/2017 10:31 AM   Preoxygenation:  Nasal cannula   Sedation:  Etomidate   Analgesia:  Fentanyl   Intra-procedure monitoring:  Blood pressure monitoring, cardiac monitor, continuous capnometry, continuous pulse oximetry, frequent LOC assessments and frequent vital sign checks   Intra-procedure events: none     Intra-procedure management:  Airway repositioning   Sedation end time:  02/23/2017 10:45 AM Post-procedure details:    Post-sedation assessment completed:  02/23/2017 10:50 AM   Attendance: Constant attendance by certified staff until patient recovered     Recovery: Patient returned to pre-procedure baseline     Post-sedation assessments completed and reviewed: airway patency, cardiovascular function, hydration status, mental status, nausea/vomiting and pain level     Patient is stable for discharge or admission: yes     Patient tolerance:  Tolerated well, no immediate complications   (including critical care time)  Medications Ordered in ED Medications  diltiazem (CARDIZEM) 1 mg/mL load via infusion 10 mg (10 mg Intravenous Bolus from Bag 02/23/17 0823)  0.9 %  sodium chloride infusion ( Intravenous Stopped 02/23/17 0912)    CRITICAL CARE Performed by: Alvino Chapel   Total critical care time: 30 minutes  Critical care time was exclusive of separately billable procedures and treating other patients.  Critical care was necessary to treat or prevent imminent or life-threatening deterioration.  Critical care was time spent personally by me on the following activities: development of treatment plan with patient and/or surrogate as well as nursing, discussions with consultants, evaluation of patient's response to treatment, examination of patient, obtaining history from patient or surrogate, ordering and performing treatments and interventions,  ordering and review of laboratory studies, ordering and review of radiographic studies, pulse oximetry and re-evaluation of patient's condition.   Initial Impression / Assessment and Plan / ED Course  I have reviewed the triage vital  signs and the nursing notes.  Pertinent labs & imaging results that were available during my care of the patient were reviewed by me and considered in my medical decision making (see chart for details).     82 year old female with a history of atrial fibrillation on Xarelto, metoprolol, coronary artery disease, aortic stenosis, status post aortic valve replacement with septal myomectomy and right middle lobectomy due to lung cancer, 4 cm aneurysmal dilation of the descending thoracic aorta, iliac artery aneurysm, hypertension, hyperlipidemia, carotid artery disease who presents with palpitations.  On arrival to the emergency department, the patient is in atrial fibrillation with a rate in the 130s.  Patient heart rate and blood pressure fluctuating from afib to 037Q-964R, BP 83K systolic to 184.  Initially BP 100s and gave diltiazem, however blood pressures decreased without change in HR and this was stopped. BP continued to fluctuate. Discussed with Cardiology and given large fluctuations in blood pressures and HR, hx of intermittent afib, hx of anticoagulation without missing doses, recommend cardioversion.  Feel this is appropriate option at this time given BP instability.  Discussed with pt. Sedated with etomidate, cardioverted to sinus rhythm. Patient observed following procedure without return of afib with RVR. Delta troponin negative. CP likely secondary to afib RVR and resolved with cardioversion (doubt dissection/PE/ACS.)   Recommend close follow up with Cardiology or atrial fibrillation clinic. Patient discharged in stable condition with understanding of reasons to return.   Final Clinical Impressions(s) / ED Diagnoses   Final diagnoses:  Atrial fibrillation  with rapid ventricular response [facility])    New Prescriptions Discharge Medication List as of 02/23/2017  2:31 PM       Gareth Morgan, MD 02/23/17 2057

## 2017-02-23 NOTE — ED Notes (Signed)
Orders changed due to pt VS.

## 2017-02-23 NOTE — ED Notes (Signed)
Pt sleeping with snoring respirations. Rouses easily to voice.

## 2017-02-23 NOTE — ED Notes (Signed)
Pt taking po ice and tolerating well.

## 2017-02-23 NOTE — ED Notes (Signed)
Pt states she understands instructions Home stable with son. Via w/c.

## 2017-02-23 NOTE — ED Notes (Addendum)
Pt synchronized cardioverted at 150J

## 2017-02-23 NOTE — ED Notes (Signed)
Placed pt on bedpan. Pt unable to urinate. A bedside commode was placed at pt's bedside.

## 2017-02-23 NOTE — ED Triage Notes (Signed)
Pt states she woke up around 0430 with chest pain and felt as if she was in afib. Pt has hx of afib, takes xarelto. Pt also appears short of breath with exertion. A&O X4. Pulse rate irregular on arrival.

## 2017-02-23 NOTE — ED Notes (Signed)
Pt hob increased to 30 degrees and watching tv.Alertt, oriented x3, MAEW resp even and non labored.

## 2017-02-24 ENCOUNTER — Telehealth: Payer: Self-pay | Admitting: Cardiology

## 2017-02-24 NOTE — Telephone Encounter (Signed)
Returned call to patient. Seen yx in ED for AFib w RVR, sedated & cardioverted w return to NSR. She was given recommendation to follow up closely w cardiology.  Before discharge, she got instructions & was set up w a visit on Monday 4/2 w A Fib clinic & advised OK to keep current appt w Dr. Stanford Breed on 4/11 in [place].  Pt asked, due to difficulty of driving to [facility], if it's ok to wait to follow up w Dr. Stanford Breed on 4/11. Notes she really doesn't want to see anyone else & would like his advice.  Pt aware I'll route msg.

## 2017-02-24 NOTE — Telephone Encounter (Signed)
PATIENT AWARE . CANCELLED 4/2 [name]

## 2017-02-24 NOTE — Telephone Encounter (Signed)
Ok to fu with me only on 4/11 [facility]

## 2017-02-24 NOTE — Telephone Encounter (Signed)
Follow up   Pt is returning call to [facility].

## 2017-02-24 NOTE — Telephone Encounter (Signed)
Left msg to call.

## 2017-02-24 NOTE — Telephone Encounter (Signed)
New message    Pt called to let us know that she went to the hospital and had to get electroshocked for AFIB. She states she was instructed to call us and let the doctor know and wants to know if she needs to make an appointment or if the doctor thinks she is okay to keep her f/u on April 11th. Requests a call back.

## 2017-02-25 ENCOUNTER — Encounter: Payer: Self-pay | Admitting: Cardiology

## 2017-03-01 ENCOUNTER — Ambulatory Visit (HOSPITAL_COMMUNITY): Payer: Medicare HMO | Admitting: Nurse Practitioner

## 2017-03-02 NOTE — Progress Notes (Signed)
HPI: FU atrial fibrillation, CAD, ho aortic stenosis status post aortic valve replacement in September 2009. She had a pericardial tissue valve. She also had a septal myectomy and a right middle lobectomy due to lung CA. Note, she had a 50% mid LAD by catheterization preoperatively. Echocardiogram April 2017 showed normal LV function, grade 2 diastolic dysfunction, prior aortic valve replacement with mean gradient 6 mmHg. Patient developed atrial fibrillation following gallbladder surgery previously. Abdominal CT October 2017 showed 3.9 cm infrarenal abdominal aortic aneurysm. There was a 4 cm aneurysm of the distal descending thoracic aorta. Carotid Dopplers November 2017 showed mild bilateral atherosclerosis and no hemodynamically significant stenosis. Patient seen in the emergency room March 2018 with recurrent atrial fibrillation and underwent cardioversion. Hgb 17. Since I last saw her, she has mild dyspnea on exertion but no orthopnea, PND, exertional chest pain or syncope. She did have a burning sensation in her chest when she developed atrial fibrillation. She complains of bilateral lower extremity claudication with ambulation.  Current Outpatient Prescriptions  Medication Sig Dispense Refill  . acetaminophen (TYLENOL) 500 MG tablet Take 500 mg by mouth every 6 (six) hours as needed for moderate pain, fever or headache. Pain     . ALPRAZolam (XANAX) 0.5 MG tablet Take 1 tablet (0.5 mg total) by mouth 2 (two) times daily as needed for anxiety. (Patient taking differently: Take 0.5 mg by mouth as needed for anxiety. ) 60 tablet 4  . amLODipine (NORVASC) 5 MG tablet Take 1 tablet (5 mg total) by mouth daily. 180 tablet 3  . lisinopril (PRINIVIL,ZESTRIL) 40 MG tablet TAKE 1 TABLET EVERY DAY 90 tablet 1  . metoprolol (LOPRESSOR) 50 MG tablet TAKE 1 AND 1/2 TABLETS TWICE DAILY 270 tablet 1  . rivaroxaban (XARELTO) 20 MG TABS tablet Take 1 tablet (20 mg total) by mouth daily with supper. 90  tablet 3  . traMADol (ULTRAM) 50 MG tablet TAKE 1 TAB BY MOUTH 2 TIMES DAILY AS NEEDED 60 tablet 2   No current facility-administered medications for this visit.      Past Medical History:  Diagnosis Date  . AAA (abdominal aortic aneurysm) ([place])   . Anemia    iron deficiency  . Ascending aorta dilatation ([facility])   . Atrial fibrillation ([place])    post op  . CAD (coronary artery disease)   . Carcinoma of lung ([place]) 08/28/2008   T2N0 moderately diff. squamous cell CA  . Cerebrovascular disease   . COPD (chronic obstructive pulmonary disease) ([place]) 2005   on CXR with R apical scaring  . Headache(784.0)    Hx: of migraines in the past  . Hypercholesterolemia    Hx: of  . Hypertension   . Incidental pulmonary nodule, > 78m and < 822m   LLL  . S/P aortic valve replacement 08/28/2008   owen  . S/P lobectomy of lung 08/28/2008   rt middle lobe  dr. owRoxy Manns. Shingles 11-09    Past Surgical History:  Procedure Laterality Date  . ABDOMINAL SURGERY    . AORTIC VALVE REPLACEMENT  [facility]/28/2008   [id number]  . APPENDECTOMY    . CARDIAC CATHETERIZATION    . CATARACT EXTRACTION W/ INTRAOCULAR LENS  IMPLANT, BILATERAL Bilateral   . CHOLECYSTECTOMY N/A 12/13/2013   Procedure: LAPAROSCOPIC CHOLECYSTECTOMY;  Surgeon: ArmRalene OkD;  [facility]  Laterality: N/A;  . COLONOSCOPY     \  . DILATION  AND CURETTAGE OF UTERUS    . ERCP N/A 05/08/2015   Procedure: ENDOSCOPIC RETROGRADE CHOLANGIOPANCREATOGRAPHY (ERCP);  Surgeon: Inda Castle, MD;  Location: [facility];  Service: Endoscopy;  Laterality: N/A;  . ERCP N/A 01/13/2016   Procedure: ENDOSCOPIC RETROGRADE CHOLANGIOPANCREATOGRAPHY (ERCP);  Surgeon: Doran Stabler, MD;  Location: [facility];  Service: Endoscopy;  Laterality: N/A;  . ERCP N/A 03/09/2016   Procedure: ENDOSCOPIC RETROGRADE CHOLANGIOPANCREATOGRAPHY (ERCP);  Surgeon: Ladene Artist, MD;  Location: [facility];  Service: Endoscopy;  Laterality: N/A;  . RML removed Dr Ricard Dillon for lung cancer  08/28/2008   T2N0 squamous cell CA  . TONSILLECTOMY AND ADENOIDECTOMY    . u/s guided aspiration of R breast cyst   2009   at the breast clinic    Social History   Social History  . Marital status: Single    Spouse name: N/A  . Number of children: N/A  . Years of education: N/A   Occupational History  . Not on file.   Social History Main Topics  . Smoking status: Former Smoker    Years: 40.00  . Smokeless tobacco: Never Used  . Alcohol use No  . Drug use: No  . Sexual activity: Not on file   Other Topics Concern  . Not on file   Social History Narrative  . No narrative on file    Family History  Problem Relation Age of Onset  . Stroke Father   . Diabetes Son     brittle diabetes  . Colon polyps Neg Hx   . Colon cancer Neg Hx   . Esophageal cancer Neg Hx   . Stomach cancer Neg Hx   . Pancreatic cancer Neg Hx   . Kidney disease Neg Hx   . Liver disease Neg Hx     ROS: Patient complains of neck and head pain but no fevers or chills, productive cough, hemoptysis, dysphasia, odynophagia, melena, hematochezia, dysuria, hematuria, rash, seizure activity, orthopnea, PND. Remaining systems are negative.  Physical Exam: Well-developed well-nourished in no acute distress.  Skin is warm and dry.  HEENT is normal.  Neck is supple.  Chest is clear to auscultation with normal expansion.  Cardiovascular exam is regular rate and rhythm. 2/6 systolic murmur Abdominal exam nontender or distended. No masses palpated. Extremities show trace edema. neuro grossly intact  ECG- Sinus rhythm at a rate of 73. Left ventricular hypertrophy with repolarization abnormality. personally reviewed  A/P  1 Thoracic aortic aneurysm-follow-up CTA Oct2018.  2 status post aortic valve replacement-continue SBE prophylaxis.  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  4 hypertension-blood  pressure elevated. Increase metoprolol to 100 mg BID. Follow blood pressure and adjust regimen as needed.  5 hyperlipidemia-patient is intolerant to statins. Continue diet.  6 coronary artery disease-intolerant to statins. No aspirin given need for anticoagulation.  7 carotid artery disease - Mild on most recent dopplers.  8abdominal aortic aneurysm-follow-up CTA Oct 2018.  9 Paroxysmal atrial fibrillation-patient remains in sinus rhythm s/p recent DCCV. Continue beta blocker (increase metoprolol to 100 BID). Continue xarelto. If she has more frequent episodes in the future will likely need to add an antiarrhythmic most likely amiodarone.  10 claudication-check ABIs with Doppler.  [name], MD

## 2017-03-05 ENCOUNTER — Other Ambulatory Visit: Payer: Self-pay | Admitting: Family Medicine

## 2017-03-09 ENCOUNTER — Other Ambulatory Visit: Payer: Self-pay | Admitting: Family Medicine

## 2017-03-10 ENCOUNTER — Ambulatory Visit (INDEPENDENT_AMBULATORY_CARE_PROVIDER_SITE_OTHER): Payer: Medicare HMO | Admitting: Cardiology

## 2017-03-10 ENCOUNTER — Encounter: Payer: Self-pay | Admitting: Cardiology

## 2017-03-10 VITALS — BP 148/86 | HR 73 | Ht 66.0 in | Wt 180.4 lb

## 2017-03-10 DIAGNOSIS — I712 Thoracic aortic aneurysm, without rupture, unspecified: Secondary | ICD-10-CM

## 2017-03-10 DIAGNOSIS — I10 Essential (primary) hypertension: Secondary | ICD-10-CM

## 2017-03-10 DIAGNOSIS — I714 Abdominal aortic aneurysm, without rupture, unspecified: Secondary | ICD-10-CM

## 2017-03-10 DIAGNOSIS — I4891 Unspecified atrial fibrillation: Secondary | ICD-10-CM

## 2017-03-10 DIAGNOSIS — Z952 Presence of prosthetic heart valve: Secondary | ICD-10-CM

## 2017-03-10 DIAGNOSIS — I739 Peripheral vascular disease, unspecified: Secondary | ICD-10-CM

## 2017-03-10 DIAGNOSIS — I2510 Atherosclerotic heart disease of native coronary artery without angina pectoris: Secondary | ICD-10-CM

## 2017-03-10 MED ORDER — METOPROLOL TARTRATE 100 MG PO TABS: 100.0000 mg | ORAL_TABLET | Freq: Two times a day (BID) | ORAL | 3 refills | Status: DC

## 2017-03-10 NOTE — Patient Instructions (Signed)
Medication Instructions:   INCREASE METOPROLOL TO 100 MG TWICE DAILY= 2 OF THE 50 MG TABLETS TWICE DAILY  Testing/Procedures:  Your physician has requested that you have a lower extremity arterial duplex. During this test, ultrasound are used to evaluate arterial blood flow in the legs. Allow one hour for this exam. There are no restrictions or special instructions.   Follow-Up:   Your physician recommends that you schedule a follow-up appointment in: [place]   If you need a refill on your cardiac medications before your next appointment, please call your pharmacy.

## 2017-03-11 ENCOUNTER — Other Ambulatory Visit: Payer: Self-pay | Admitting: *Deleted

## 2017-03-11 MED ORDER — ALPRAZOLAM 0.5 MG PO TABS: 0.5000 mg | ORAL_TABLET | Freq: Two times a day (BID) | ORAL | 4 refills | Status: DC | PRN

## 2017-03-11 NOTE — Telephone Encounter (Signed)
Can patient have a refill? She has been seen recently but not for anxiety. Please advise.

## 2017-03-11 NOTE — Telephone Encounter (Signed)
I'm sorry, refill for what>

## 2017-03-22 ENCOUNTER — Ambulatory Visit
Admission: RE | Admit: 2017-03-22 | Discharge: 2017-03-22 | Disposition: A | Discharge status: Home / Self Care | Payer: Medicare HMO | Source: Ambulatory Visit | Attending: Thoracic Surgery (Cardiothoracic Vascular Surgery) | Admitting: Thoracic Surgery (Cardiothoracic Vascular Surgery)

## 2017-03-22 ENCOUNTER — Encounter: Payer: Self-pay | Admitting: Thoracic Surgery (Cardiothoracic Vascular Surgery)

## 2017-03-22 ENCOUNTER — Ambulatory Visit (INDEPENDENT_AMBULATORY_CARE_PROVIDER_SITE_OTHER): Payer: Medicare HMO | Admitting: Thoracic Surgery (Cardiothoracic Vascular Surgery)

## 2017-03-22 ENCOUNTER — Other Ambulatory Visit: Payer: Medicare HMO

## 2017-03-22 VITALS — BP 120/68 | HR 66 | Resp 20 | Ht 66.0 in | Wt 180.0 lb

## 2017-03-22 DIAGNOSIS — R911 Solitary pulmonary nodule: Secondary | ICD-10-CM

## 2017-03-22 NOTE — Progress Notes (Signed)
[address]       [place] [address]             [phone]     CARDIOTHORACIC SURGERY OFFICE NOTE  Referring Provider is Stanford Breed, Denice Bors, MD PCP is METHENEY,CATHERINE, MD   HPI:  Patient is a 82 year old female with multiple medical problems who returns to our office today for follow-up and surveillance of an incidental pulmonary nodule noted in the base of the left lung. 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 She has had stable mild aneurysmal enlargement of the ascending thoracic aorta.  She was last seen in our office in September 2017 and CT scan performed at that time revealed question as to whether not the benign-appearing lung nodule seen near the base of the left lung had perhaps increased in size. She returns to our office today for follow-up. She states that she has had some problems with atrial fibrillation over the past several months and a variety of other complaints including generalized malaise and occasional headaches. She specifically denies any chest pain, shortness of breath, or cough.   Current Outpatient Prescriptions  Medication Sig Dispense Refill  . acetaminophen (TYLENOL) 500 MG tablet Take 500 mg by mouth every 6 (six) hours as needed for moderate pain, fever or headache. Pain     . ALPRAZolam (XANAX) 0.5 MG tablet Take 1 tablet (0.5 mg total) by mouth 2 (two) times daily as needed for anxiety. 60 tablet 4  . lisinopril (PRINIVIL,ZESTRIL) 40 MG tablet TAKE 1 TABLET EVERY DAY 90 tablet 1  . metoprolol (LOPRESSOR) 100 MG tablet Take 1 tablet (100 mg total) by mouth 2 (two) times daily. 180 tablet 3  . rivaroxaban (XARELTO) 20 MG TABS tablet Take 1 tablet (20 mg total) by mouth daily with supper. 90 tablet 3  . traMADol  (ULTRAM) 50 MG tablet TAKE 1 TAB BY MOUTH 2 TIMES DAILY AS NEEDED 60 tablet 2  . amLODipine (NORVASC) 5 MG tablet Take 1 tablet (5 mg total) by mouth daily. 180 tablet 3   No current facility-administered medications for this visit.       Physical Exam:   BP 120/68   Pulse 66   Resp 20   Ht '5\' 6"'$  (1.676 m)   Wt 180 lb (81.6 kg)   SpO2 93% Comment: RA  BMI 29.05 kg/m   General:  Well-appearing  Chest:   Clear to auscultation  CV:   Regular rate and rhythm with very soft systolic murmur heard best along the sternal border  Incisions:  n/a  Abdomen:  Soft nontender  Extremities:  Warm and well-perfused  Diagnostic Tests:  CT CHEST WITHOUT CONTRAST  TECHNIQUE: Multidetector CT imaging of the chest was performed following the standard protocol without IV contrast.  COMPARISON:  08/24/2016  FINDINGS: Cardiovascular: No cardiomegaly. Status post aortic valve replacement. Diffuse atherosclerosis. Ascending aortic dilatation measuring up to 42 mm. Distal descending aneurysmal enlargement with unchanged left preferential bulging, 40 mm in maximal diameter.  Mediastinum/Nodes:   No evidence of adenopathy or mass.  Lungs/Pleura: Spiculated nodule in the left lower lobe, 4:88 measures 7 x 4 mm, 6 mm average diameter. The nodule does appear larger than on 2014 and 2012 CT, but axial images at that time were 5 mm, and there  is a degree of slice selection. On coronal reformats, the nodule measures 7 mm throughout scans.  Right middle lobectomy with pleural scarring.  There is no edema, consolidation, effusion, or pneumothorax.  Upper Abdomen: Pneumobilia and common bile duct dilatation. These changes are chronic based on priors.  Chronic left adrenal nodule measuring 19 mm, multi year stability consistent with adenoma.  Musculoskeletal: No acute finding. Suprarenal fusiform abdominal aortic aneurysm measures up to 38 mm on coronal reformats, increased from 2016  when it measured approximately 36 mm in the same plane.  IMPRESSION: 1. The 7 x 4 mm left lower lobe pulmonary nodule is stable. Although size is more prominent than in 2012, this may be related to different slice thickness between these 2 scans. On coronal reformats there is no evident growth since 2012, benign behavior. 2. Right middle lobectomy.  Stable right pleural scarring. 3. Fusiform ascending aortic aneurysm measuring up to 42 mm, unchanged from September 2017. 4. Distal descending fusiform aneurysm measuring up to 4 cm, unchanged. 5. Infrarenal aortic aneurysm measuring 33 mm diameter.   Electronically Signed   By: Monte Fantasia M.D.   On: 03/22/2017 13:41    Impression:  Patient has stable radiographic appearance of benign-appearing incidental pulmonary nodule seen near the base of the left lung. I personally reviewed the patient's recent noncontrast CT scan of the chest. This nodule appears stable and has not changed significantly for many years.  Plan:  Patient will return in 1 year for routine follow-up CT angiogram.  I spent in excess of 15 minutes during the conduct of this office consultation and >50% of this time involved direct face-to-face encounter with the patient for counseling and/or coordination of their care.  Valentina Gu. Roxy Manns, MD 03/22/2017 2:41 PM

## 2017-03-22 NOTE — Patient Instructions (Signed)
Continue all previous medications without any changes at this time  

## 2017-03-29 ENCOUNTER — Ambulatory Visit: Payer: Medicare HMO | Admitting: Thoracic Surgery (Cardiothoracic Vascular Surgery)

## 2017-03-30 ENCOUNTER — Ambulatory Visit (HOSPITAL_COMMUNITY)
Admission: RE | Admit: 2017-03-30 | Discharge: 2017-03-30 | Disposition: A | Discharge status: Home / Self Care | Payer: Medicare HMO | Source: Ambulatory Visit | Attending: Internal Medicine | Admitting: Internal Medicine

## 2017-03-30 DIAGNOSIS — I739 Peripheral vascular disease, unspecified: Secondary | ICD-10-CM | POA: Insufficient documentation

## 2017-04-02 ENCOUNTER — Other Ambulatory Visit: Payer: Self-pay | Admitting: Family Medicine

## 2017-04-05 ENCOUNTER — Other Ambulatory Visit: Payer: Self-pay

## 2017-04-05 MED ORDER — METOPROLOL TARTRATE 100 MG PO TABS: 100.0000 mg | ORAL_TABLET | Freq: Two times a day (BID) | ORAL | 3 refills | Status: DC

## 2017-05-04 ENCOUNTER — Ambulatory Visit (INDEPENDENT_AMBULATORY_CARE_PROVIDER_SITE_OTHER): Payer: Medicare HMO | Admitting: Cardiovascular Disease

## 2017-05-04 VITALS — BP 139/82 | HR 70 | Ht 66.0 in | Wt 184.0 lb

## 2017-05-04 DIAGNOSIS — I739 Peripheral vascular disease, unspecified: Secondary | ICD-10-CM

## 2017-05-04 DIAGNOSIS — I10 Essential (primary) hypertension: Secondary | ICD-10-CM | POA: Diagnosis not present

## 2017-05-04 DIAGNOSIS — I480 Paroxysmal atrial fibrillation: Secondary | ICD-10-CM | POA: Diagnosis not present

## 2017-05-04 NOTE — Progress Notes (Signed)
Cardiology Office Note   Date:  05/04/2017   ID:  Laurie [name], DOB 1935/03/30, MRN [id number]  PCP:  Hali Marry, MD  Cardiologist: Dr. Stanford Breed  Chief Complaint  Patient presents with  . New Evaluation    Dr. Stanford Breed       History of Present Illness: Laurie [name] is a 82 y.o. female who was referred by Dr. Stanford Breed For evaluation and management of peripheral arterial disease. She has known history of paroxysmal atrial fibrillation, coronary artery disease, aortic stenosis status post aortic valve replacement in 2009, lung cancer, thoracic and abdominal aortic aneurysms, mild bilateral carotid disease and recent diagnosis of peripheral arterial disease. She complains of bilateral calf burning sensation after walking about 5 minutes. She reports that the symptoms are worse on the left than the right. The symptoms have been present for about 6 months with gradual worsening. She complains of toe pain at night she does complain of associated shortness of breath but no chest pain. She quit smoking in 2009. She is not diabetic. She underwent noninvasive vascular evaluation which showed an ABI of 0.57 on the right and 0.76 on the left. Waveforms were suggestive of inflow disease. Duplex showed significant distal aortic and bilateral common iliac artery stenosis with moderate left SFA stenosis.   Past Medical History:  Diagnosis Date  . AAA (abdominal aortic aneurysm) ([place])   . Anemia    iron deficiency  . Ascending aorta dilatation ([facility])   . Atrial fibrillation ([place])    post op  . CAD (coronary artery disease)   . Carcinoma of lung ([place]) 08/28/2008   T2N0 moderately diff. squamous cell CA  . Cerebrovascular disease   . COPD (chronic obstructive pulmonary disease) ([place]) 2005   on CXR with R apical scaring  . Headache(784.0)    Hx: of migraines in the past  . Hypercholesterolemia    Hx: of  . Hypertension   . Incidental pulmonary nodule, > 68mm and < 17mm      LLL  . S/P aortic valve replacement 08/28/2008   owen  . S/P lobectomy of lung 08/28/2008   rt middle lobe  dr. Roxy Manns  . Shingles 11-09    Past Surgical History:  Procedure Laterality Date  . ABDOMINAL SURGERY    . AORTIC VALVE REPLACEMENT  08/28/2008   #31mm [facility] Ease pericardial tissue valve  . APPENDECTOMY    . CARDIAC CATHETERIZATION    . CATARACT EXTRACTION W/ INTRAOCULAR LENS  IMPLANT, BILATERAL Bilateral   . CHOLECYSTECTOMY N/A 12/13/2013   Procedure: LAPAROSCOPIC CHOLECYSTECTOMY;  Surgeon: Ralene Ok, MD;  Location: [facility];  Service: General;  Laterality: N/A;  . COLONOSCOPY     \  . DILATION AND CURETTAGE OF UTERUS    . ERCP N/A 05/08/2015   Procedure: ENDOSCOPIC RETROGRADE CHOLANGIOPANCREATOGRAPHY (ERCP);  Surgeon: Inda Castle, MD;  Location: [facility];  Service: Endoscopy;  Laterality: N/A;  . ERCP N/A 01/13/2016   Procedure: ENDOSCOPIC RETROGRADE CHOLANGIOPANCREATOGRAPHY (ERCP);  Surgeon: Doran Stabler, MD;  Location[facility];  Service: Endoscopy;  Laterality: N/A;  . ERCP N/A 03/09/2016   Procedure: ENDOSCOPIC RETROGRADE CHOLANGIOPANCREATOGRAPHY (ERCP);  Surgeon: Ladene Artist, MD;  Location[facility];  Service: Endoscopy;  Laterality: N/A;  . RML removed Dr Ricard Dillon for lung cancer  08/28/2008   T2N0 squamous cell CA  . TONSILLECTOMY AND ADENOIDECTOMY    . u/s guided aspiration of R breast cyst   2009   at the  breast clinic     Current Outpatient Prescriptions  Medication Sig Dispense Refill  . acetaminophen (TYLENOL) 500 MG tablet Take 500 mg by mouth every 6 (six) hours as needed for moderate pain, fever or headache. Pain     . ALPRAZolam (XANAX) 0.5 MG tablet Take 1 tablet (0.5 mg total) by mouth 2 (two) times daily as needed for anxiety. 60 tablet 4  . lisinopril (PRINIVIL,ZESTRIL) 40 MG tablet TAKE 1 TABLET EVERY DAY 90 tablet 1  . metoprolol (LOPRESSOR) 100 MG tablet Take 1 tablet (100 mg total) by mouth 2 (two) times daily.  180 tablet 3  . rivaroxaban (XARELTO) 20 MG TABS tablet Take 1 tablet (20 mg total) by mouth daily with supper. 90 tablet 3  . traMADol (ULTRAM) 50 MG tablet TAKE 1 TAB BY MOUTH 2 TIMES DAILY AS NEEDED 60 tablet 2  . amLODipine (NORVASC) 5 MG tablet Take 1 tablet (5 mg total) by mouth daily. 180 tablet 3   No current facility-administered medications for this visit.     Allergies:   Hyoscyamine; Rofecoxib; and Statins    Social History:  The patient  reports that she has quit smoking. She quit after 40.00 years of use. She has never used smokeless tobacco. She reports that she does not drink alcohol or use drugs.   Family History:  The patient's family history includes Diabetes in her son; Stroke in her father.    ROS:  Please see the history of present illness.   Otherwise, review of systems are positive for none.   All other systems are reviewed and negative.    PHYSICAL EXAM: VS:  BP 139/82 (BP Location: Right Arm, Patient Position: Sitting, Cuff Size: Normal)   Pulse 70   Ht 5\' 6"  (1.676 m)   Wt 184 lb (83.5 kg)   BMI 29.70 kg/m  , BMI Body mass index is 29.7 kg/m. GEN: Well nourished, well developed, in no acute distress  HEENT: normal  Neck: no JVD, carotid bruits, or masses Cardiac: RRR; no murmurs, rubs, or gallops,no edema  Respiratory:  clear to auscultation bilaterally, normal work of breathing GI: soft, nontender, nondistended, + BS MS: no deformity or atrophy  Skin: warm and dry, no rash Neuro:  Strength and sensation are intact Psych: euthymic mood, full affect Vascular: Femoral pulses are +1 bilaterally. Distal pulses are not palpable.  EKG:  EKG is not ordered today.    Recent Labs: 02/23/2017: ALT 23; BUN 9; Creatinine, Ser 0.85; Hemoglobin 17.0; Magnesium 2.0; Platelets 292; Potassium 4.2; Sodium 131    Lipid Panel    Component Value Date/Time   CHOL 291 (H) 09/18/2010 2032   TRIG 308 (H) 09/18/2010 2032   HDL 38 (L) 09/18/2010 2032   CHOLHDL 7.7  Ratio 09/18/2010 2032   VLDL 62 (H) 09/18/2010 2032   LDLCALC 191 (H) 09/18/2010 2032      Wt Readings from Last 3 Encounters:  05/04/17 184 lb (83.5 kg)  03/22/17 180 lb (81.6 kg)  03/10/17 180 lb 6.4 oz (81.8 kg)       PAD Screen 05/04/2017 05/04/2017  Previous PAD dx? No No  Previous surgical procedure? No No  Pain with walking? Yes Yes  Subsides with rest? No No  Feet/toe relief with dangling? Yes Yes  Painful, non-healing ulcers? No No  Extremities discolored? No No      ASSESSMENT AND PLAN:  1.  Peripheral arterial disease with moderate to severe bilateral leg claudication due to aortoiliac disease. I  discussed with her natural history and management of claudication and given severity of her symptoms I discussed the option of proceeding with angiography and possible endovascular intervention. She wants to try a conservative approach initially and I discussed with her the importance of a walking program. We can refer to rehabilitation once the indication is approved. Treatment with a statin should be considered given the presence of peripheral arterial disease.  2. Paroxysmal atrial fibrillation: She seems to be in sinus rhythm.  3. Essential hypertension: Blood pressure is controlled on current medications.    Disposition:   FU with me in 2 months  Signed,  Kathlyn [place], MD  05/04/2017 11:37 AM    [place]

## 2017-05-04 NOTE — Patient Instructions (Signed)
Medication Instructions:  Your physician recommends that you continue on your current medications as directed. Please refer to the Current Medication list given to you today.  Labwork: No new orders.   Testing/Procedures: No new orders.   Follow-Up: Your physician recommends that you schedule a follow-up appointment in: 2 MONTHS with Dr Fletcher Anon   Any Other Special Instructions Will Be Listed Below (If Applicable).     If you need a refill on your cardiac medications before your next appointment, please call your pharmacy.

## 2017-06-03 NOTE — Progress Notes (Signed)
HPI: FU atrial fibrillation, CAD, ho aortic stenosis status post aortic valve replacement in September 2009. She had a pericardial tissue valve; also had a septal myectomy and a right middle lobectomy due to lung CA. Note, she had a 50% mid LAD by catheterization preoperatively. Echocardiogram April 2017 showed normal LV function, grade 2 diastolic dysfunction, prior aortic valve replacement with mean gradient 6 mmHg. Carotid Dopplers November 2017 showed mild bilateral atherosclerosis and no hemodynamically significant stenosis. Patient seen in the emergency room March 2018 with recurrent atrial fibrillation and underwent cardioversion. CTA April 2018 showed stable pulmonary nodule. There was a 42 mm ascending aortic aneurysm and 4 cm distal thoracic aneurysm. There was an abdominal aortic aneurysm measuring 33 mm. Dopplers 5/18: ABI of 0.57 on the right and 0.76 on the left. Waveforms were suggestive of inflow disease. Duplex showed significant distal aortic and bilateral common iliac artery stenosis with moderate left SFA stenosis. Seen by Dr Fletcher Anon and medical therapy recommended for now. Since I last saw her, patient has multiple complaints. She describes shoulder pain, back pain and leg pain. She states she has had 3 episodes of atrial fibrillation described as her chest burning associated with checking her heart rate which is 150. This typically lasts 10 minutes and resolves. She denies exertional chest pain. She does have dyspnea on exertion but no orthopnea, PND or pedal edema.  Current Outpatient Prescriptions  Medication Sig Dispense Refill  . acetaminophen (TYLENOL) 500 MG tablet Take 500 mg by mouth every 6 (six) hours as needed for moderate pain, fever or headache. Pain     . ALPRAZolam (XANAX) 0.5 MG tablet Take 1 tablet (0.5 mg total) by mouth 2 (two) times daily as needed for anxiety. 60 tablet 4  . amLODipine (NORVASC) 5 MG tablet Take 1 tablet (5 mg total) by mouth daily. 180  tablet 3  . lisinopril (PRINIVIL,ZESTRIL) 40 MG tablet TAKE 1 TABLET EVERY DAY 90 tablet 1  . metoprolol (LOPRESSOR) 100 MG tablet Take 1 tablet (100 mg total) by mouth 2 (two) times daily. 180 tablet 3  . rivaroxaban (XARELTO) 20 MG TABS tablet Take 1 tablet (20 mg total) by mouth daily with supper. 90 tablet 3  . traMADol (ULTRAM) 50 MG tablet TAKE 1 TAB BY MOUTH 2 TIMES DAILY AS NEEDED (Patient not taking: Reported on 06/16/2017) 60 tablet 2   No current facility-administered medications for this visit.      Past Medical History:  Diagnosis Date  . AAA (abdominal aortic aneurysm) ([place])   . Anemia    iron deficiency  . Ascending aorta dilatation (HCC)   . Atrial fibrillation ([place])    post op  . CAD (coronary artery disease)   . Carcinoma of lung ([place]) 08/28/2008   T2N0 moderately diff. squamous cell CA  . Cerebrovascular disease   . COPD (chronic obstructive pulmonary disease) ([place]) 2005   on CXR with R apical scaring  . Headache(784.0)    Hx: of migraines in the past  . Hypercholesterolemia    Hx: of  . Hypertension   . Incidental pulmonary nodule, > 23mm and < 46mm    LLL  . S/P aortic valve replacement 08/28/2008   owen  . S/P lobectomy of lung 08/28/2008   rt middle lobe  dr. Roxy Manns  . Shingles 11-09    Past Surgical History:  Procedure Laterality Date  . ABDOMINAL SURGERY    . AORTIC VALVE REPLACEMENT  08/28/2008   #61mm [facility]  Ease pericardial tissue valve  . APPENDECTOMY    . CARDIAC CATHETERIZATION    . CATARACT EXTRACTION W/ INTRAOCULAR LENS  IMPLANT, BILATERAL Bilateral   . CHOLECYSTECTOMY N/A 12/13/2013   Procedure: LAPAROSCOPIC CHOLECYSTECTOMY;  Surgeon: Ralene Ok, MD;  Location: [place];  Service: General;  Laterality: N/A;  . COLONOSCOPY     \  . DILATION AND CURETTAGE OF UTERUS    . ERCP N/A 05/08/2015   Procedure: ENDOSCOPIC RETROGRADE CHOLANGIOPANCREATOGRAPHY (ERCP);  Surgeon: Inda Castle, MD;  Location: [place];  Service: Endoscopy;   Laterality: N/A;  . ERCP N/A 01/13/2016   Procedure: ENDOSCOPIC RETROGRADE CHOLANGIOPANCREATOGRAPHY (ERCP);  Surgeon: Doran Stabler, MD;  Location: [facility];  Service: Endoscopy;  Laterality: N/A;  . ERCP N/A 03/09/2016   Procedure: ENDOSCOPIC RETROGRADE CHOLANGIOPANCREATOGRAPHY (ERCP);  Surgeon: Ladene Artist, MD;  Location: [facility];  Service: Endoscopy;  Laterality: N/A;  . RML removed Dr Ricard Dillon for lung cancer  08/28/2008   T2N0 squamous cell CA  . TONSILLECTOMY AND ADENOIDECTOMY    . u/s guided aspiration of R breast cyst   2009   at the breast clinic    Social History   Social History  . Marital status: Single    Spouse name: N/A  . Number of children: N/A  . Years of education: N/A   Occupational History  . Not on file.   Social History Main Topics  . Smoking status: Former Smoker    Years: 40.00  . Smokeless tobacco: Never Used  . Alcohol use No  . Drug use: No  . Sexual activity: Not on file   Other Topics Concern  . Not on file   Social History Narrative  . No narrative on file    Family History  Problem Relation Age of Onset  . Stroke Father   . Diabetes Son        brittle diabetes  . Colon polyps Neg Hx   . Colon cancer Neg Hx   . Esophageal cancer Neg Hx   . Stomach cancer Neg Hx   . Pancreatic cancer Neg Hx   . Kidney disease Neg Hx   . Liver disease Neg Hx     ROS: Shoulder, back and leg pain but no fevers or chills, productive cough, hemoptysis, dysphasia, odynophagia, melena, hematochezia, dysuria, hematuria, rash, seizure activity, orthopnea, PND, pedal edema. Remaining systems are negative.  Physical Exam: Well-developed well-nourished in no acute distress.  Skin is warm and dry.  HEENT is normal.  Neck is supple.  Chest diminished breath sounds throughout. Cardiovascular exam is regular rate and rhythm. 2/6 systolic murmur left sternal border. Abdominal exam nontender or distended. No masses palpated. Extremities show no  edema. neuro grossly intact   A/P  1 Thoracic aortic aneurysm-follow-up CTA 4/19.  2 status post aortic valve replacement-continue SBE prophylaxis.  3 iliac artery aneurysm-follow-up 4/19.  4 hypertension-blood pressure controlled. However she states her dose of metoprolol has caused stomach upset. I will decrease to 75 mg daily and follow.  Hyperlipidemia-intolerant to statins. Continue diet.  6 coronary artery disease-intolerant to statins. No aspirin given need for anticoagulation.  7 carotid artery disease - Mild on most recent dopplers.  8abdominal aortic aneurysm-follow-up CTA 4/19.  9 Paroxysmal atrial fibrillation-patient remains in sinus rhythm on examination today. However she has had several episodes of atrial fibrillation with heart rate greater than 150. I will add amiodarone 200 mg twice a day for 2 weeks and then 200 mg daily thereafter.  Hopefully this will help maintain sinus rhythm. Continue xarelto and metoprolol (decrease dose as outlined above).  10 claudication-patient has been evaluated by Dr. Fletcher Anon. Plan conservative measures for now. She is intolerant to statins.  [name], MD

## 2017-06-16 ENCOUNTER — Ambulatory Visit (INDEPENDENT_AMBULATORY_CARE_PROVIDER_SITE_OTHER): Payer: Medicare HMO | Admitting: Cardiology

## 2017-06-16 ENCOUNTER — Encounter: Payer: Self-pay | Admitting: Cardiology

## 2017-06-16 VITALS — BP 120/80 | HR 68 | Ht 66.0 in | Wt 184.0 lb

## 2017-06-16 DIAGNOSIS — Z952 Presence of prosthetic heart valve: Secondary | ICD-10-CM

## 2017-06-16 DIAGNOSIS — I480 Paroxysmal atrial fibrillation: Secondary | ICD-10-CM

## 2017-06-16 DIAGNOSIS — I714 Abdominal aortic aneurysm, without rupture, unspecified: Secondary | ICD-10-CM

## 2017-06-16 DIAGNOSIS — I712 Thoracic aortic aneurysm, without rupture, unspecified: Secondary | ICD-10-CM

## 2017-06-16 DIAGNOSIS — I2510 Atherosclerotic heart disease of native coronary artery without angina pectoris: Secondary | ICD-10-CM

## 2017-06-16 DIAGNOSIS — I10 Essential (primary) hypertension: Secondary | ICD-10-CM

## 2017-06-16 MED ORDER — METOPROLOL TARTRATE 75 MG PO TABS: 75.0000 mg | ORAL_TABLET | Freq: Two times a day (BID) | ORAL | 3 refills | Status: DC

## 2017-06-16 MED ORDER — AMIODARONE HCL 200 MG PO TABS: 200.0000 mg | ORAL_TABLET | Freq: Every day | ORAL | 3 refills | Status: DC

## 2017-06-16 NOTE — Patient Instructions (Signed)
Medication Instructions:   DECREASE METOPROLOL TO 75 MG TWICE DAILY  START AMIODARONE 200 MG ONE TABLET TWICE DAILY X 1 WEEK THEN DECREASE TO ONE TABLET ONCE DAILY  Follow-Up:  Your physician recommends that you schedule a follow-up appointment in: [place]   If you need a refill on your cardiac medications before your next appointment, please call your pharmacy.

## 2017-06-17 ENCOUNTER — Other Ambulatory Visit: Payer: Self-pay | Admitting: Family Medicine

## 2017-06-21 ENCOUNTER — Telehealth: Payer: Self-pay | Admitting: Cardiology

## 2017-06-21 NOTE — Telephone Encounter (Signed)
New message    Pt is calling.   Pt c/o medication issue:  1. Name of Medication: amiodarone 200 mg  2. How are you currently taking this medication (dosage and times per day)? 1 tab once daily  3. Are you having a reaction (difficulty breathing--STAT)?   4. What is your medication issue? Pt states that the medication is making her sick. She said she feels bad. Her face hurts, her arm hurts, her heart hurts.

## 2017-06-21 NOTE — Telephone Encounter (Signed)
Amiodarone adjustment done by cardiologist.

## 2017-06-21 NOTE — Telephone Encounter (Signed)
Returned the call to the patient. She stated that she started on Amiodarone 200 mg on Saturday. She is only taking it once daily because it is making her feel bad. She stated that she aches all over and does not want to get out of bed. She does not want to take it anymore. Will route to pharmd and the covering provider for further recommendation.

## 2017-06-24 ENCOUNTER — Encounter (HOSPITAL_COMMUNITY): Payer: Self-pay

## 2017-06-24 ENCOUNTER — Emergency Department (HOSPITAL_COMMUNITY): Payer: Medicare HMO

## 2017-06-24 ENCOUNTER — Emergency Department (HOSPITAL_COMMUNITY)
Admission: EM | Admit: 2017-06-24 | Discharge: 2017-06-25 | Disposition: A | Discharge status: Home / Self Care | Payer: Medicare HMO | Attending: Emergency Medicine | Admitting: Emergency Medicine

## 2017-06-24 DIAGNOSIS — R1031 Right lower quadrant pain: Secondary | ICD-10-CM | POA: Insufficient documentation

## 2017-06-24 DIAGNOSIS — R109 Unspecified abdominal pain: Secondary | ICD-10-CM

## 2017-06-24 DIAGNOSIS — R1084 Generalized abdominal pain: Secondary | ICD-10-CM | POA: Diagnosis present

## 2017-06-24 DIAGNOSIS — R1033 Periumbilical pain: Secondary | ICD-10-CM | POA: Diagnosis not present

## 2017-06-24 DIAGNOSIS — Z8673 Personal history of transient ischemic attack (TIA), and cerebral infarction without residual deficits: Secondary | ICD-10-CM | POA: Insufficient documentation

## 2017-06-24 DIAGNOSIS — J449 Chronic obstructive pulmonary disease, unspecified: Secondary | ICD-10-CM | POA: Insufficient documentation

## 2017-06-24 DIAGNOSIS — Z87891 Personal history of nicotine dependence: Secondary | ICD-10-CM | POA: Diagnosis not present

## 2017-06-24 DIAGNOSIS — K838 Other specified diseases of biliary tract: Secondary | ICD-10-CM | POA: Diagnosis not present

## 2017-06-24 DIAGNOSIS — Z79899 Other long term (current) drug therapy: Secondary | ICD-10-CM | POA: Diagnosis not present

## 2017-06-24 DIAGNOSIS — R062 Wheezing: Secondary | ICD-10-CM | POA: Diagnosis not present

## 2017-06-24 DIAGNOSIS — I10 Essential (primary) hypertension: Secondary | ICD-10-CM | POA: Diagnosis not present

## 2017-06-24 LAB — URINALYSIS, ROUTINE W REFLEX MICROSCOPIC
Bilirubin Urine: NEGATIVE
GLUCOSE, UA: NEGATIVE mg/dL
HGB URINE DIPSTICK: NEGATIVE
Ketones, ur: NEGATIVE mg/dL
NITRITE: NEGATIVE
Protein, ur: NEGATIVE mg/dL
SPECIFIC GRAVITY, URINE: 1.008 (ref 1.005–1.030)
pH: 7 (ref 5.0–8.0)

## 2017-06-24 LAB — COMPREHENSIVE METABOLIC PANEL
ALK PHOS: 77 U/L (ref 38–126)
ALT: 19 U/L (ref 14–54)
ANION GAP: 10 (ref 5–15)
AST: 24 U/L (ref 15–41)
Albumin: 4 g/dL (ref 3.5–5.0)
BILIRUBIN TOTAL: 1 mg/dL (ref 0.3–1.2)
BUN: 9 mg/dL (ref 6–20)
CALCIUM: 9.1 mg/dL (ref 8.9–10.3)
CO2: 25 mmol/L (ref 22–32)
Chloride: 94 mmol/L — ABNORMAL LOW (ref 101–111)
Creatinine, Ser: 0.72 mg/dL (ref 0.44–1.00)
Glucose, Bld: 117 mg/dL — ABNORMAL HIGH (ref 65–99)
POTASSIUM: 4.1 mmol/L (ref 3.5–5.1)
Sodium: 129 mmol/L — ABNORMAL LOW (ref 135–145)
TOTAL PROTEIN: 6.7 g/dL (ref 6.5–8.1)

## 2017-06-24 LAB — CBC
HEMATOCRIT: 47.5 % — AB (ref 36.0–46.0)
HEMOGLOBIN: 15.3 g/dL — AB (ref 12.0–15.0)
MCH: 28.7 pg (ref 26.0–34.0)
MCHC: 32.2 g/dL (ref 30.0–36.0)
MCV: 89.1 fL (ref 78.0–100.0)
Platelets: 223 10*3/uL (ref 150–400)
RBC: 5.33 MIL/uL — ABNORMAL HIGH (ref 3.87–5.11)
RDW: 12.6 % (ref 11.5–15.5)
WBC: 8.2 10*3/uL (ref 4.0–10.5)

## 2017-06-24 LAB — LIPASE, BLOOD: Lipase: 28 U/L (ref 11–51)

## 2017-06-24 MED ORDER — IPRATROPIUM-ALBUTEROL 0.5-2.5 (3) MG/3ML IN SOLN
3.0000 mL | Freq: Once | RESPIRATORY_TRACT | Status: AC
  Administered 2017-06-24: 3 mL via RESPIRATORY_TRACT
  Filled 2017-06-24: qty 3

## 2017-06-24 MED ORDER — ONDANSETRON HCL 4 MG/2ML IJ SOLN
4.0000 mg | Freq: Once | INTRAMUSCULAR | Status: AC
  Administered 2017-06-24: 4 mg via INTRAVENOUS
  Filled 2017-06-24: qty 2

## 2017-06-24 MED ORDER — IOPAMIDOL (ISOVUE-300) INJECTION 61%
INTRAVENOUS | Status: AC
  Administered 2017-06-24: 100 mL
  Filled 2017-06-24: qty 100

## 2017-06-24 MED ORDER — FENTANYL CITRATE (PF) 100 MCG/2ML IJ SOLN
INTRAMUSCULAR | Status: AC
  Filled 2017-06-24: qty 2

## 2017-06-24 MED ORDER — FENTANYL CITRATE (PF) 100 MCG/2ML IJ SOLN
50.0000 ug | Freq: Once | INTRAMUSCULAR | Status: AC
  Administered 2017-06-24: 50 ug via INTRAVENOUS

## 2017-06-24 NOTE — ED Notes (Signed)
Son out in hallway yelling "we've been here for five years and no doctor has seen [place]." Redirected and will attempt to find MD to see pt.

## 2017-06-24 NOTE — Telephone Encounter (Signed)
OK to stay off it until follow up appointment. Please call us if she develops rapid palpitations or otherwise unexpected rapid heart rate. MCr

## 2017-06-24 NOTE — ED Notes (Signed)
Vital signs stable. 

## 2017-06-24 NOTE — ED Notes (Addendum)
Son up to desk at [facility] requesting updates, states "you all need to get my mom discharged or something so she can go home and take a tramadol." Son stated that they had been waiting for four or five hours to be seen. Status time 2 hours and 59 minutes at time of this interaction. Informed family of this and informed that they are next to go back. Son states "we've been next to go back for hours."  Apologized to patient's family and offered to pull to trauma room. Agreeable to this. 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

## 2017-06-24 NOTE — ED Provider Notes (Signed)
[facility] DEPT Provider Note   CSN: [id number] Arrival date & time: 06/24/17  1836     History   Chief Complaint Chief Complaint  Patient presents with  . Flank Pain    HPI Laurie [name] is a 82 y.o. female.  82yo F w/ PMH including AAA, A fib, CAD who presents with abdominal pain. This morning the patient began having right-sided abdominal pain that has become progressively more severe. The pain radiates around to her right side. It is in the same area where she has had problems with her bile duct previously. She also reports a long time history of feeling a bulging area in her right lower abdomen near her groin. She denies any nausea, vomiting, diarrhea, fevers, or urinary symptoms. No shortness of breath or breathing problems, no chest pain, cough, or recent illness.   The history is provided by the patient.    Past Medical History:  Diagnosis Date  . AAA (abdominal aortic aneurysm) ([place])   . Anemia    iron deficiency  . Ascending aorta dilatation (HCC)   . Atrial fibrillation ([place])    post op  . CAD (coronary artery disease)   . Carcinoma of lung ([place]) 08/28/2008   T2N0 moderately diff. squamous cell CA  . Cerebrovascular disease   . COPD (chronic obstructive pulmonary disease) ([place]) 2005   on CXR with R apical scaring  . Headache(784.0)    Hx: of migraines in the past  . Hypercholesterolemia    Hx: of  . Hypertension   . Incidental pulmonary nodule, > 68mm and < 77mm    LLL  . S/P aortic valve replacement 08/28/2008   owen  . S/P lobectomy of lung 08/28/2008   rt middle lobe  dr. Roxy Manns  . Shingles 11-09    Patient Active Problem List   Diagnosis Date Noted  . Incidental pulmonary nodule, > 25mm and < 68mm   . Claudication ([place]) 04/22/2016  . Chronic obstructive pulmonary disease ([place])   . Dyspnea   . Acute respiratory failure with hypoxia ([place])   . Atrial fibrillation with RVR ([place])   . HTN (hypertension) 03/09/2016  . COPD (chronic obstructive  pulmonary disease) ([place])   . RUQ abdominal pain   . Biliary calculus   . Pulmonary emphysema ([place])   . Aortic valve disease   . Thoracic aortic aneurysm without rupture ([place]) 08/19/2015  . Ascending aorta dilatation (HCC)   . Abdominal aortic aneurysm ([place]) 08/26/2011  . HYPERGLYCEMIA, MILD 11/10/2010  . OSTEOPENIA 09/29/2010  . Coronary atherosclerosis 05/15/2009  . Bilateral carotid artery disease ([place]) 05/15/2009  . TIA 05/15/2009  . ILIAC ARTERY ANEURYSM 05/15/2009  . GAD (generalized anxiety disorder) 03/20/2009  . THYROID NODULE 01/09/2009  . THYROMEGALY 01/08/2009  . SIALOLITHIASIS 01/08/2009  . MALIGNANT NEOPLASM MIDDLE LOBE BRONCHUS OR LUNG 10/24/2008  . HLD (hyperlipidemia) 10/24/2008  . AORTIC VALVE REPLACEMENT, HX OF 10/24/2008  . S/P aortic valve replacement 08/28/2008  . DIVERTICULOSIS OF COLON 06/19/2008  . ANEMIA, IRON DEFICIENCY 06/06/2008  . Essential hypertension, benign 06/06/2008  . GASTROINTESTINAL HEMORRHAGE, HX OF 06/06/2008    Past Surgical History:  Procedure Laterality Date  . ABDOMINAL SURGERY    . AORTIC VALVE REPLACEMENT  08/28/2008   #28mm [facility] Ease pericardial tissue valve  . APPENDECTOMY    . CARDIAC CATHETERIZATION    . CATARACT EXTRACTION W/ INTRAOCULAR LENS  IMPLANT, BILATERAL Bilateral   . CHOLECYSTECTOMY N/A 12/13/2013   Procedure: LAPAROSCOPIC CHOLECYSTECTOMY;  Surgeon: Ralene Ok,  MD;  Location: [place];  Service: General;  Laterality: N/A;  . COLONOSCOPY     \  . DILATION AND CURETTAGE OF UTERUS    . ERCP N/A 05/08/2015   Procedure: ENDOSCOPIC RETROGRADE CHOLANGIOPANCREATOGRAPHY (ERCP);  Surgeon: Inda Castle, MD;  Location: [place];  Service: Endoscopy;  Laterality: N/A;  . ERCP N/A 01/13/2016   Procedure: ENDOSCOPIC RETROGRADE CHOLANGIOPANCREATOGRAPHY (ERCP);  Surgeon: Doran Stabler, MD;  Location[facility];  Service: Endoscopy;  Laterality: N/A;  . ERCP N/A 03/09/2016   Procedure: ENDOSCOPIC RETROGRADE  CHOLANGIOPANCREATOGRAPHY (ERCP);  Surgeon: Ladene Artist, MD;  Location[facility];  Service: Endoscopy;  Laterality: N/A;  . RML removed Dr Ricard Dillon for lung cancer  08/28/2008   T2N0 squamous cell CA  . TONSILLECTOMY AND ADENOIDECTOMY    . u/s guided aspiration of R breast cyst   2009   at the breast clinic    OB History    No data available       Home Medications    Prior to Admission medications   Medication Sig Start Date End Date Taking? Authorizing Provider  acetaminophen (TYLENOL) 500 MG tablet Take 500 mg by mouth every 6 (six) hours as needed (for pain).    Yes [provider]  ALPRAZolam (XANAX) 0.5 MG tablet Take 1 tablet (0.5 mg total) by mouth 2 (two) times daily as needed for anxiety. 03/11/17  Yes Hali Marry, MD  amLODipine (NORVASC) 5 MG tablet Take 1 tablet (5 mg total) by mouth daily. 12/09/16 06/24/17 Yes Lelon Perla, MD  lisinopril (PRINIVIL,ZESTRIL) 40 MG tablet TAKE 1 TABLET EVERY DAY Patient taking differently: Take 40 mg by mouth once a day 12/21/16  Yes Crenshaw, Denice Bors, MD  metoprolol tartrate (LOPRESSOR) 50 MG tablet Take 75 mg by mouth 2 (two) times daily. 06/22/17  Yes [provider]  polyethylene glycol powder (GLYCOLAX/MIRALAX) powder Take 17 g by mouth daily as needed for mild constipation.   Yes [provider]  rivaroxaban (XARELTO) 20 MG TABS tablet Take 1 tablet (20 mg total) by mouth daily with supper. 12/03/16  Yes Lelon Perla, MD  traMADol (ULTRAM) 50 MG tablet TAKE 1 TABLET BY MOUTH TWICE A DAY AS NEEDED FOR PAIN Patient taking differently: Take 50 mg by mouth two times a day as needed for pain 06/17/17  Yes Hali Marry, MD  amiodarone (PACERONE) 200 MG tablet Take 1 tablet (200 mg total) by mouth daily. Patient not taking: Reported on 06/24/2017 06/16/17   Lelon Perla, MD  metoprolol tartrate 75 MG TABS Take 75 mg by mouth 2 (two) times daily. Patient not taking: Reported on 06/24/2017  06/16/17   Lelon Perla, MD    Family History Family History  Problem Relation Age of Onset  . Stroke Father   . Diabetes Son        brittle diabetes  . Colon polyps Neg Hx   . Colon cancer Neg Hx   . Esophageal cancer Neg Hx   . Stomach cancer Neg Hx   . Pancreatic cancer Neg Hx   . Kidney disease Neg Hx   . Liver disease Neg Hx     Social History Social History  Substance Use Topics  . Smoking status: Former Smoker    Years: 40.00  . Smokeless tobacco: Never Used  . Alcohol use No     Allergies   Hyoscyamine; Rofecoxib; Statins; and Amiodarone   Review of Systems Review of Systems  All other systems reviewed and are negative except that which was mentioned in HPI   Physical Exam Updated Vital Signs BP (!) 119/51   Pulse 77   Temp 97.8 F (36.6 C) (Oral)   Resp 19   Ht 5\' 6"  (1.676 m)   Wt 83.5 kg (184 lb)   SpO2 (!) 87%   BMI 29.70 kg/m   Physical Exam  Constitutional: She is oriented to person, place, and time. She appears well-developed and well-nourished. No distress.  Uncomfortable, mildly anxious  HENT:  Head: Normocephalic and atraumatic.  Moist mucous membranes  Eyes: Pupils are equal, round, and reactive to light. Conjunctivae are normal.  Neck: Neck supple.  Cardiovascular: Normal rate, regular rhythm and normal heart sounds.   No murmur heard. Pulmonary/Chest: Effort normal and breath sounds normal.  Abdominal: Soft. Bowel sounds are normal. She exhibits no distension and no mass. There is tenderness. There is no rebound and no guarding.  Tenderness of R mid abdomen to right of umbilicus, no bulge or hernia appreciated over RLQ scar or in R groin  Musculoskeletal: She exhibits no edema.  Neurological: She is alert and oriented to person, place, and time.  Fluent speech  Skin: Skin is warm and dry.  Psychiatric: Judgment normal.  anxious  Nursing note and vitals reviewed.    ED Treatments / Results  Labs (all labs ordered are  listed, but only abnormal results are displayed) Labs Reviewed  COMPREHENSIVE METABOLIC PANEL - Abnormal; Notable for the following:       Result Value   Sodium 129 (*)    Chloride 94 (*)    Glucose, Bld 117 (*)    All other components within normal limits  CBC - Abnormal; Notable for the following:    RBC 5.33 (*)    Hemoglobin 15.3 (*)    HCT 47.5 (*)    All other components within normal limits  URINALYSIS, ROUTINE W REFLEX MICROSCOPIC - Abnormal; Notable for the following:    Color, Urine STRAW (*)    Leukocytes, UA SMALL (*)    Bacteria, UA RARE (*)    Squamous Epithelial / LPF 0-5 (*)    All other components within normal limits  LIPASE, BLOOD    EKG  EKG Interpretation None       Radiology Ct Abdomen Pelvis W Contrast  Result Date: 06/25/2017 CLINICAL DATA:  Right lower quadrant abdominal pain with right flank pain starting today. Worse throughout the day. EXAM: CT ABDOMEN AND PELVIS WITH CONTRAST TECHNIQUE: Multidetector CT imaging of the abdomen and pelvis was performed using the standard protocol following bolus administration of intravenous contrast. CONTRAST:  139mL ISOVUE-300 IOPAMIDOL (ISOVUE-300) INJECTION 61% COMPARISON:  09/19/2016 FINDINGS: Lower chest: Emphysematous changes and fibrosis in the lung bases. Small esophageal hiatal hernia. Hepatobiliary: Gallbladder is not identified and may be surgically absent or contracted. Bile ducts are mildly dilated with pneumobilia present. This is likely related to previous surgery. Appearances are similar to previous study. No focal liver lesions identified. Pancreas: Mild dilatation of the pancreatic duct in the head and body region. Appearance is similar to previous study and may indicate previous pancreatitis. No obstructing lesions are identified. No inflammatory changes. Spleen: Normal in size without focal abnormality. Adrenals/Urinary Tract: Left adrenal gland nodule measuring 1.9 cm diameter, similar to prior study.  Kidneys are symmetrical. No solid mass or hydronephrosis. Bladder wall is not thickened and no filling defects are demonstrated. Stomach/Bowel: Stomach, small bowel, and colon are not abnormally distended. Colon  is diffusely stool-filled. No colonic wall thickening. Appendix is surgically absent. Vascular/Lymphatic: Diffuse aortic calcification. Focal dilatation of the abdominal aorta with maximal AP diameter 3.3 cm. No significant change. A ectatic iliac arteries. No significant lymphadenopathy. Reproductive: Uterus and bilateral adnexa are unremarkable. Other: No abdominal wall hernia or abnormality. No abdominopelvic ascites. Musculoskeletal: Degenerative changes in the spine. No destructive bone lesions. IMPRESSION: 1. No acute process demonstrated in the abdomen or pelvis. 2. Emphysematous changes and fibrosis in the lung bases. Small esophageal hiatal hernia. 3. Pneumobilia with mild bile duct dilatation, likely postoperative. 4. Mild dilatation of the pancreatic duct in the head and body, likely postinflammatory. No change since prior study. 5. 1.9 cm left adrenal gland nodule without change. 6. Aortic atherosclerosis with focal ectatic abdominal aorta. Electronically Signed   By: Lucienne Capers M.D.   On: 06/25/2017 00:14    Procedures Procedures (including critical care time)  Medications Ordered in ED Medications  fentaNYL (SUBLIMAZE) injection 50 mcg (50 mcg Intravenous Given 06/24/17 2303)  ondansetron (ZOFRAN) injection 4 mg (4 mg Intravenous Given 06/24/17 2306)  ipratropium-albuterol (DUONEB) 0.5-2.5 (3) MG/3ML nebulizer solution 3 mL (3 mLs Nebulization Given 06/24/17 2307)  iopamidol (ISOVUE-300) 61 % injection (100 mLs  Contrast Given 06/24/17 2347)     Initial Impression / Assessment and Plan / ED Course  I have reviewed the triage vital signs and the nursing notes.  Pertinent labs & imaging results that were available during my care of the patient were reviewed by me and  considered in my medical decision making (see chart for details).     PT w/ 1 day of right sided abdominal pain, has had cholecystectomy but last year had biliary obstruction requiring ERCP. She was anxious and uncomfortable but non-toxic on exam. VS reassuring. She had R mid abdominal tenderness with no peritonitis. I did not appreciate any hernias. Her labs show Na 129, normal WBC. Normal LFTs and lipase. UA not suggestive of infection. Because of complex hx, obtained CT abd pelvis. Gave fentanyl and zofran. Also gave duoneb for wheezing although pt denies any SOB or other Respiratory symptoms.  CT shows pneumobilia and again mild dilatation similar to previous studies, no acute findings. The patient states that her area of pain is the same place that she had during previous episode therefore I contacted gastroenterology and discussed with Dr. Havery Moros, I appreciate his assistance. I also reviewed Dr. Corena Pilgrim note from previous clinic visit, which describes a pretty similar scenario of symptoms. We agreed that because of her reassuring CT scan and completely normal LFTs and bilirubin, it is unlikely that she has any sort of obstructing stone at this point. I will send a message to Dr. Loletha Carrow regarding her presentation and follow-up. I have encouraged patient to schedule follow-up appointment and have also instructed her to follow-up with PCP as I suspect she may have undiagnosed COPD. She has absolutely no respiratory complaints therefore I do not feel she needs steroids at this time but I have provided with albuterol inhaler to use as needed at home. Extensively reviewed return precautions with the patient and her son. They voiced understanding and patient was discharged in satisfactory condition.  Final Clinical Impressions(s) / ED Diagnoses   Final diagnoses:  None    New Prescriptions New Prescriptions   No medications on file     Little, Wenda Overland, MD 06/25/17 0145

## 2017-06-24 NOTE — ED Notes (Signed)
Patient transported to CT 

## 2017-06-24 NOTE — Telephone Encounter (Signed)
Left message to call back  

## 2017-06-24 NOTE — ED Triage Notes (Signed)
Per Pt, Pt is coming from home with complaints of right lower abdominal pain along with right flank pain that started today and has gotten worse throughout the day. Pt denies N/V/D or urinary symptoms.

## 2017-06-25 ENCOUNTER — Emergency Department (HOSPITAL_COMMUNITY): Payer: Medicare HMO

## 2017-06-25 DIAGNOSIS — R109 Unspecified abdominal pain: Secondary | ICD-10-CM | POA: Diagnosis not present

## 2017-06-25 DIAGNOSIS — R062 Wheezing: Secondary | ICD-10-CM | POA: Diagnosis not present

## 2017-06-25 MED ORDER — AEROCHAMBER PLUS FLO-VU LARGE MISC
1.0000 | Freq: Once | Status: AC
  Administered 2017-06-25: 1

## 2017-06-25 MED ORDER — OXYCODONE-ACETAMINOPHEN 5-325 MG PO TABS: 1.0000 | ORAL_TABLET | Freq: Four times a day (QID) | ORAL | 0 refills | Status: DC | PRN

## 2017-06-25 MED ORDER — ALBUTEROL SULFATE HFA 108 (90 BASE) MCG/ACT IN AERS
2.0000 | INHALATION_SPRAY | RESPIRATORY_TRACT | Status: DC | PRN
  Administered 2017-06-25: 2 via RESPIRATORY_TRACT
  Filled 2017-06-25: qty 6.7

## 2017-06-25 MED ORDER — OXYCODONE-ACETAMINOPHEN 5-325 MG PO TABS
2.0000 | ORAL_TABLET | Freq: Once | ORAL | Status: AC
  Administered 2017-06-25: 2 via ORAL
  Filled 2017-06-25: qty 2

## 2017-06-25 MED ORDER — MORPHINE SULFATE (PF) 4 MG/ML IV SOLN: 4.0000 mg | Freq: Once | INTRAVENOUS | Status: DC

## 2017-06-25 NOTE — ED Notes (Signed)
Pt stable, ambulatory, states understanding of discharge instructions 

## 2017-06-26 NOTE — Telephone Encounter (Signed)
Would resume amiodarone 200 mg daily to see if she tolerates as she did before when on this med Laurie [name]

## 2017-06-29 NOTE — Telephone Encounter (Signed)
Spoke with pt, Aware of dr Jacalyn Lefevre recommendations. She will try taking 1/2 tablet for several days and then if able to tolerate she will increase to 1 tablet daily.

## 2017-07-02 ENCOUNTER — Telehealth: Payer: Self-pay | Admitting: *Deleted

## 2017-07-02 MED ORDER — AMOXICILLIN 500 MG PO TABS: 2000.0000 mg | ORAL_TABLET | Freq: Once | ORAL | 0 refills | Status: DC

## 2017-07-02 NOTE — Telephone Encounter (Signed)
Patient is requesting an antibiotic to be sent to her pharmacy. She has a dental procedure next week  and because of aortic valve replacement she has to be on antibiotics before the procedure.

## 2017-07-02 NOTE — Telephone Encounter (Signed)
Patient notified

## 2017-07-02 NOTE — Telephone Encounter (Signed)
Sent to [facility].

## 2017-07-06 ENCOUNTER — Ambulatory Visit: Payer: Medicare HMO | Admitting: Cardiovascular Disease

## 2017-08-09 ENCOUNTER — Telehealth: Payer: Self-pay | Admitting: Cardiology

## 2017-08-09 NOTE — Telephone Encounter (Signed)
Spoke with pt, aware I do not have any samples here in [place]. Will check in [place] when I gi on Wednesday and call her if I can find some.

## 2017-08-09 NOTE — Telephone Encounter (Signed)
New message       Patient calling the office for samples of medication:   1.  What medication and dosage are you requesting samples for?  xarelto 20mg  2.  Are you currently out of this medication? Almost out.  Pt is in the donut hole

## 2017-08-11 MED ORDER — LISINOPRIL 40 MG PO TABS: 40.0000 mg | ORAL_TABLET | Freq: Every day | ORAL | 3 refills | Status: DC

## 2017-08-11 NOTE — Telephone Encounter (Signed)
Spoke with pt, aware no samples available. 

## 2017-08-24 NOTE — Progress Notes (Signed)
HPI: FU atrial fibrillation, CAD, ho aortic stenosis status post aortic valve replacement in September 2009. She had a pericardial tissue valve; also had a septal myectomy and a right middle lobectomy due to lung CA. Note, she had a 50% mid LAD by catheterization preoperatively. Echocardiogram April 2017 showed normal LV function, grade 2 diastolic dysfunction, prior aortic valve replacement with mean gradient 6 mmHg. Carotid Dopplers November 2017 showed mild bilateral atherosclerosis and no hemodynamically significant stenosis. Patient seen in the emergency room March 2018 with recurrent atrial fibrillation and underwent cardioversion. CTA April 2018 showed stable pulmonary nodule. There was a 42 mm ascending aortic aneurysm and 4 cm distal thoracic aneurysm. There was an abdominal aortic aneurysm measuring 33 mm. Dopplers 5/18: ABI of 0.57 on the right and 0.76 on the left. Waveforms were suggestive of inflow disease. Duplex showed significant distal aortic and bilateral common iliac artery stenosis with moderate left SFA stenosis. Seen by Dr Fletcher Anon and medical therapy recommended for now. Amiodarone added at last ov for recurrent atrial fibrillation. Since I last saw her, patient denies dyspnea, chest pain, palpitations or syncope. No bleeding.  Current Outpatient Prescriptions  Medication Sig Dispense Refill  . acetaminophen (TYLENOL) 500 MG tablet Take 500 mg by mouth every 6 (six) hours as needed (for pain).     Marland Kitchen ALPRAZolam (XANAX) 0.5 MG tablet Take 1 tablet (0.5 mg total) by mouth 2 (two) times daily as needed for anxiety. 60 tablet 4  . amiodarone (PACERONE) 200 MG tablet Take 1 tablet (200 mg total) by mouth daily. 97 tablet 3  . amLODipine (NORVASC) 5 MG tablet Take 1 tablet (5 mg total) by mouth daily. 180 tablet 3  . lisinopril (PRINIVIL,ZESTRIL) 40 MG tablet Take 1 tablet (40 mg total) by mouth daily. 90 tablet 3  . metoprolol tartrate 75 MG TABS Take 75 mg by mouth 2 (two) times  daily. 180 tablet 3  . polyethylene glycol powder (GLYCOLAX/MIRALAX) powder Take 17 g by mouth daily as needed for mild constipation.    . rivaroxaban (XARELTO) 20 MG TABS tablet Take 1 tablet (20 mg total) by mouth daily with supper. 90 tablet 3  . traMADol (ULTRAM) 50 MG tablet TAKE 1 TABLET BY MOUTH TWICE A DAY AS NEEDED FOR PAIN (Patient taking differently: Take 50 mg by mouth two times a day as needed for pain) 60 tablet 2   No current facility-administered medications for this visit.      Past Medical History:  Diagnosis Date  . AAA (abdominal aortic aneurysm) ([place])   . Anemia    iron deficiency  . Ascending aorta dilatation ([facility])   . Atrial fibrillation ([place])    post op  . CAD (coronary artery disease)   . Carcinoma of lung ([place]) 08/28/2008   T2N0 moderately diff. squamous cell CA  . Cerebrovascular disease   . COPD (chronic obstructive pulmonary disease) ([place]) 2005   on CXR with R apical scaring  . Headache(784.0)    Hx: of migraines in the past  . Hypercholesterolemia    Hx: of  . Hypertension   . Incidental pulmonary nodule, > 58mm and < 2mm    LLL  . S/P aortic valve replacement 08/28/2008   owen  . S/P lobectomy of lung 08/28/2008   rt middle lobe  dr. Roxy Manns  . Shingles 11-09    Past Surgical History:  Procedure Laterality Date  . ABDOMINAL SURGERY    . AORTIC VALVE REPLACEMENT  08/28/2008   #  17mm [facility] Ease pericardial tissue valve  . APPENDECTOMY    . CARDIAC CATHETERIZATION    . CATARACT EXTRACTION W/ INTRAOCULAR LENS  IMPLANT, BILATERAL Bilateral   . CHOLECYSTECTOMY N/A 12/13/2013   Procedure: LAPAROSCOPIC CHOLECYSTECTOMY;  Surgeon: Ralene Ok, MD;  Location: [place];  Service: General;  Laterality: N/A;  . COLONOSCOPY     \  . DILATION AND CURETTAGE OF UTERUS    . ERCP N/A 05/08/2015   Procedure: ENDOSCOPIC RETROGRADE CHOLANGIOPANCREATOGRAPHY (ERCP);  Surgeon: Inda Castle, MD;  Location: [place];  Service: Endoscopy;  Laterality: N/A;   . ERCP N/A 01/13/2016   Procedure: ENDOSCOPIC RETROGRADE CHOLANGIOPANCREATOGRAPHY (ERCP);  Surgeon: Doran Stabler, MD;  Location: [facility];  Service: Endoscopy;  Laterality: N/A;  . ERCP N/A 03/09/2016   Procedure: ENDOSCOPIC RETROGRADE CHOLANGIOPANCREATOGRAPHY (ERCP);  Surgeon: Ladene Artist, MD;  Location: [facility];  Service: Endoscopy;  Laterality: N/A;  . RML removed Dr Ricard Dillon for lung cancer  08/28/2008   T2N0 squamous cell CA  . TONSILLECTOMY AND ADENOIDECTOMY    . u/s guided aspiration of R breast cyst   2009   at the breast clinic    Social History   Social History  . Marital status: Single    Spouse name: N/A  . Number of children: N/A  . Years of education: N/A   Occupational History  . Not on file.   Social History Main Topics  . Smoking status: Former Smoker    Years: 40.00  . Smokeless tobacco: Never Used  . Alcohol use No  . Drug use: No  . Sexual activity: Not on file   Other Topics Concern  . Not on file   Social History Narrative  . No narrative on file    Family History  Problem Relation Age of Onset  . Stroke Father   . Diabetes Son        brittle diabetes  . Colon polyps Neg Hx   . Colon cancer Neg Hx   . Esophageal cancer Neg Hx   . Stomach cancer Neg Hx   . Pancreatic cancer Neg Hx   . Kidney disease Neg Hx   . Liver disease Neg Hx     ROS: Abdominal pain which is chronic but no fevers or chills, productive cough, hemoptysis, dysphasia, odynophagia, melena, hematochezia, dysuria, hematuria, rash, seizure activity, orthopnea, PND, pedal edema, claudication. Remaining systems are negative.  Physical Exam: Well-developed well-nourished in no acute distress.  Skin is warm and dry.  HEENT is normal.  Neck is supple.  Chest is clear to auscultation with normal expansion.  Cardiovascular exam is regular rate and rhythm. 2/6 systolic murmur LSB Abdominal exam nontender or distended. No masses palpated. Extremities show no  edema. neuro grossly intact  A/P  1 Status post aortic valve replacement-continue SBE prophylaxis.  2 thoracic aortic aneurysm/abdominal aortic aneurysm/iliac aneurysm-follow-up CTA April 2019.  3 hypertension-blood pressure is controlled. Continue present medications.  4 hyperlipidemia-patient is intolerant to statins. Continue diet.  5 coronary artery disease-patient is not on aspirin given need for anticoagulation. Intolerant to statins.  6 paroxysmal atrial fibrillation-patient remains in sinus rhythm on examination today. We will continue with amiodarone. Continue xarelto. Check renal function, hemoglobin, TSH, liver functions and chest x-ray in 2 months.  7 peripheral vascular disease-patient is intolerant to statins and not on aspirin because of need for anticoagulation. Medical therapy recommended by Dr. Fletcher Anon.  [name], MD

## 2017-09-01 ENCOUNTER — Encounter: Payer: Self-pay | Admitting: Cardiology

## 2017-09-01 ENCOUNTER — Ambulatory Visit (INDEPENDENT_AMBULATORY_CARE_PROVIDER_SITE_OTHER): Payer: Medicare HMO | Admitting: Cardiology

## 2017-09-01 VITALS — BP 125/74 | HR 60 | Ht 66.0 in | Wt 182.8 lb

## 2017-09-01 DIAGNOSIS — I2510 Atherosclerotic heart disease of native coronary artery without angina pectoris: Secondary | ICD-10-CM

## 2017-09-01 DIAGNOSIS — I10 Essential (primary) hypertension: Secondary | ICD-10-CM | POA: Diagnosis not present

## 2017-09-01 DIAGNOSIS — E7800 Pure hypercholesterolemia, unspecified: Secondary | ICD-10-CM

## 2017-09-01 DIAGNOSIS — I480 Paroxysmal atrial fibrillation: Secondary | ICD-10-CM | POA: Diagnosis not present

## 2017-09-01 NOTE — Telephone Encounter (Signed)
Issue resolved.

## 2017-09-01 NOTE — Patient Instructions (Signed)
Medication Instructions:   NO CHANGE  Labwork:  Your physician recommends that you return for lab work in: 2 MONTHS  Testing/Procedures:  A chest x-ray takes a picture of the organs and structures inside the chest, including the heart, lungs, and blood vessels. This test can show several things, including, whether the heart is enlarges; whether fluid is building up in the lungs; and whether pacemaker / defibrillator leads are still in place. IN 2 MONTHS  Follow-Up:  Your physician wants you to follow-up in: [place] will receive a reminder letter in the mail two months in advance. If you don't receive a letter, please call our office to schedule the follow-up appointment.   If you need a refill on your cardiac medications before your next appointment, please call your pharmacy.

## 2017-09-02 DIAGNOSIS — I480 Paroxysmal atrial fibrillation: Secondary | ICD-10-CM | POA: Diagnosis not present

## 2017-09-02 LAB — CBC
HEMATOCRIT: 46.2 % — AB (ref 35.0–45.0)
Hemoglobin: 15.7 g/dL — ABNORMAL HIGH (ref 11.7–15.5)
MCH: 29.4 pg (ref 27.0–33.0)
MCHC: 34 g/dL (ref 32.0–36.0)
MCV: 86.5 fL (ref 80.0–100.0)
MPV: 10.9 fL (ref 7.5–12.5)
Platelets: 233 10*3/uL (ref 140–400)
RBC: 5.34 10*6/uL — ABNORMAL HIGH (ref 3.80–5.10)
RDW: 12.6 % (ref 11.0–15.0)
WBC: 8.5 10*3/uL (ref 3.8–10.8)

## 2017-09-02 LAB — HEPATIC FUNCTION PANEL
AG RATIO: 1.6 (calc) (ref 1.0–2.5)
ALT: 17 U/L (ref 6–29)
AST: 17 U/L (ref 10–35)
Albumin: 3.9 g/dL (ref 3.6–5.1)
Alkaline phosphatase (APISO): 82 U/L (ref 33–130)
BILIRUBIN DIRECT: 0.1 mg/dL (ref 0.0–0.2)
BILIRUBIN INDIRECT: 0.7 mg/dL (ref 0.2–1.2)
GLOBULIN: 2.4 g/dL (ref 1.9–3.7)
Total Bilirubin: 0.8 mg/dL (ref 0.2–1.2)
Total Protein: 6.3 g/dL (ref 6.1–8.1)

## 2017-09-02 LAB — BASIC METABOLIC PANEL
BUN: 8 mg/dL (ref 7–25)
CHLORIDE: 96 mmol/L — AB (ref 98–110)
CO2: 28 mmol/L (ref 20–32)
CREATININE: 0.76 mg/dL (ref 0.60–0.88)
Calcium: 8.9 mg/dL (ref 8.6–10.4)
Glucose, Bld: 124 mg/dL — ABNORMAL HIGH (ref 65–99)
POTASSIUM: 4.1 mmol/L (ref 3.5–5.3)
Sodium: 132 mmol/L — ABNORMAL LOW (ref 135–146)

## 2017-09-02 LAB — TSH: TSH: 1.9 m[IU]/L (ref 0.40–4.50)

## 2017-09-07 ENCOUNTER — Other Ambulatory Visit: Payer: Self-pay | Admitting: Family Medicine

## 2017-09-10 ENCOUNTER — Telehealth: Payer: Self-pay | Admitting: Cardiology

## 2017-09-10 NOTE — Telephone Encounter (Signed)
Patient called in to clarify when she needed her CXR. Advised that her CXR is due in 2 months (Dec 2018).

## 2017-09-10 NOTE — Telephone Encounter (Signed)
°  New Prob  Has some questions regarding her discharge paperwork from last visit. Please call.

## 2017-09-20 ENCOUNTER — Ambulatory Visit (INDEPENDENT_AMBULATORY_CARE_PROVIDER_SITE_OTHER): Payer: Medicare HMO | Admitting: Family Medicine

## 2017-09-20 ENCOUNTER — Other Ambulatory Visit (HOSPITAL_COMMUNITY)
Admission: RE | Admit: 2017-09-20 | Discharge: 2017-09-20 | Disposition: A | Discharge status: Home / Self Care | Payer: Medicare HMO | Source: Ambulatory Visit | Attending: Family Medicine | Admitting: Family Medicine

## 2017-09-20 ENCOUNTER — Encounter: Payer: Self-pay | Admitting: Family Medicine

## 2017-09-20 VITALS — BP 127/64 | HR 61 | Ht 66.0 in | Wt 184.0 lb

## 2017-09-20 DIAGNOSIS — Z23 Encounter for immunization: Secondary | ICD-10-CM

## 2017-09-20 DIAGNOSIS — R102 Pelvic and perineal pain: Secondary | ICD-10-CM | POA: Diagnosis not present

## 2017-09-20 DIAGNOSIS — N760 Acute vaginitis: Secondary | ICD-10-CM | POA: Diagnosis not present

## 2017-09-20 DIAGNOSIS — R87610 Atypical squamous cells of undetermined significance on cytologic smear of cervix (ASC-US): Secondary | ICD-10-CM | POA: Insufficient documentation

## 2017-09-20 DIAGNOSIS — R3911 Hesitancy of micturition: Secondary | ICD-10-CM

## 2017-09-20 DIAGNOSIS — R319 Hematuria, unspecified: Secondary | ICD-10-CM | POA: Diagnosis not present

## 2017-09-20 LAB — POCT URINALYSIS DIPSTICK
Bilirubin, UA: NEGATIVE
GLUCOSE UA: NEGATIVE
KETONES UA: NEGATIVE
Nitrite, UA: NEGATIVE
Protein, UA: NEGATIVE
SPEC GRAV UA: 1.015 (ref 1.010–1.025)
Urobilinogen, UA: 2 E.U./dL — AB
pH, UA: 7 (ref 5.0–8.0)

## 2017-09-20 MED ORDER — TRAMADOL HCL 50 MG PO TABS: 50.0000 mg | ORAL_TABLET | Freq: Three times a day (TID) | ORAL | 2 refills | Status: DC | PRN

## 2017-09-20 NOTE — Progress Notes (Signed)
Subjective:    Patient ID: Laurie [name], female    DOB: 1935/02/27, 82 y.o.   MRN: [id number]  HPI  82 year old comes in today complaining of lower abdominal pressure radiating into the pelvis.  She says this been going on for weeks and may be even over a month or 2.  She is not exactly sure.  She is also had intermittent vaginal discharge which she describes as slimy and has noticed an occasional vaginal odor which is also new for her.  She denies any blood in the urine or stool.  She does not have a lot of urinary frequency during the daytime but notices that when she does urinate it seems to be more of a dribble instead of a steady stream.  She has not had any dysuria unless she just tries to hold her urine.  No fevers chills or sweats.  She says her overall abdominal area has been somewhat tender.  She also says that she has had very foul-smelling gas for a while.  She really was not able to quantify it whether or not it was months or even up to a year.  She has not tried any over-the-counter treatments.  She wondered if she could have a yeast infection.  Last Pap smear was 7 years ago in 2011.  Also complains of pain in the right anterior groin crease that radiates down into the right labia and pelvic bone.   Review of Systems  BP 127/64   Pulse 61   Ht 5\' 6"  (1.676 m)   Wt 184 lb (83.5 kg)   SpO2 93%   BMI 29.70 kg/m     Allergies  Allergen Reactions  . Hyoscyamine Other (See Comments)    Mental status changes.  (as of 02/13/16 patient denies any problems with this medication)  . Rofecoxib Anaphylaxis and Hives  . Statins Other (See Comments)    Muscle aches    Past Medical History:  Diagnosis Date  . AAA (abdominal aortic aneurysm) ([place])   . Anemia    iron deficiency  . Ascending aorta dilatation ([facility])   . Atrial fibrillation ([place])    post op  . CAD (coronary artery disease)   . Carcinoma of lung ([place]) 08/28/2008   T2N0 moderately diff. squamous cell CA  .  Cerebrovascular disease   . COPD (chronic obstructive pulmonary disease) ([place]) 2005   on CXR with R apical scaring  . Headache(784.0)    Hx: of migraines in the past  . Hypercholesterolemia    Hx: of  . Hypertension   . Incidental pulmonary nodule, > 81mm and < 70mm    LLL  . S/P aortic valve replacement 08/28/2008   [facility]en  . S/P lobectomy of lung 08/28/2008   rt middle lobe  dr. Roxy Manns  . Shingles 11-09    Past Surgical History:  Procedure Laterality Date  . ABDOMINAL SURGERY    . AORTIC VALVE REPLACEMENT  08/28/2008   #68mm [facility] Ease pericardial tissue valve  . APPENDECTOMY    . CARDIAC CATHETERIZATION    . CATARACT EXTRACTION W/ INTRAOCULAR LENS  IMPLANT, BILATERAL Bilateral   . CHOLECYSTECTOMY N/A 12/13/2013   Procedure: LAPAROSCOPIC CHOLECYSTECTOMY;  Surgeon: Ralene Ok, MD;  Location: [place];  Service: General;  Laterality: N/A;  . COLONOSCOPY     \  . DILATION AND CURETTAGE OF UTERUS    . ERCP N/A 05/08/2015   Procedure: ENDOSCOPIC RETROGRADE CHOLANGIOPANCREATOGRAPHY (ERCP);  Surgeon: Inda Castle, MD;  Location: [facility];  Service: Endoscopy;  Laterality: N/A;  . ERCP N/A 01/13/2016   Procedure: ENDOSCOPIC RETROGRADE CHOLANGIOPANCREATOGRAPHY (ERCP);  Surgeon: Doran Stabler, MD;  Location: [facility];  Service: Endoscopy;  Laterality: N/A;  . ERCP N/A 03/09/2016   Procedure: ENDOSCOPIC RETROGRADE CHOLANGIOPANCREATOGRAPHY (ERCP);  Surgeon: Ladene Artist, MD;  Location: [facility];  Service: Endoscopy;  Laterality: N/A;  . RML removed Dr Ricard Dillon for lung cancer  08/28/2008   T2N0 squamous cell CA  . TONSILLECTOMY AND ADENOIDECTOMY    . u/s guided aspiration of R breast cyst   2009   at the breast clinic    Social History   Social History  . Marital status: Single    Spouse name: N/A  . Number of children: N/A  . Years of education: N/A   Occupational History  . Not on file.   Social History Main Topics  . Smoking status: Former Smoker     Years: 40.00  . Smokeless tobacco: Never Used  . Alcohol use No  . Drug use: No  . Sexual activity: Not on file   Other Topics Concern  . Not on file   Social History Narrative  . No narrative on file    Family History  Problem Relation Age of Onset  . Stroke Father   . Diabetes Son        brittle diabetes  . Colon polyps Neg Hx   . Colon cancer Neg Hx   . Esophageal cancer Neg Hx   . Stomach cancer Neg Hx   . Pancreatic cancer Neg Hx   . Kidney disease Neg Hx   . Liver disease Neg Hx     Outpatient Encounter Prescriptions as of 09/20/2017  Medication Sig  . acetaminophen (TYLENOL) 500 MG tablet Take 500 mg by mouth every 6 (six) hours as needed (for pain).   Marland Kitchen ALPRAZolam (XANAX) 0.5 MG tablet TAKE 1 TABLET BY MOUTH TWICE A DAY  . amiodarone (PACERONE) 200 MG tablet Take 1 tablet (200 mg total) by mouth daily.  Marland Kitchen amLODipine (NORVASC) 5 MG tablet Take 1 tablet (5 mg total) by mouth daily.  Marland Kitchen lisinopril (PRINIVIL,ZESTRIL) 40 MG tablet Take 1 tablet (40 mg total) by mouth daily.  . metoprolol tartrate 75 MG TABS Take 75 mg by mouth 2 (two) times daily.  . polyethylene glycol powder (GLYCOLAX/MIRALAX) powder Take 17 g by mouth daily as needed for mild constipation.  . rivaroxaban (XARELTO) 20 MG TABS tablet Take 1 tablet (20 mg total) by mouth daily with supper.  . traMADol (ULTRAM) 50 MG tablet Take 1 tablet (50 mg total) by mouth 3 (three) times daily as needed. for pain  . [DISCONTINUED] traMADol (ULTRAM) 50 MG tablet TAKE 1 TABLET BY MOUTH TWICE A DAY AS NEEDED FOR PAIN (Patient taking differently: Take 50 mg by mouth two times a day as needed for pain)   No facility-administered encounter medications on file as of 09/20/2017.         Objective:   Physical Exam  Constitutional: She is oriented to person, place, and time. She appears well-developed and well-nourished.  HENT:  Head: Normocephalic and atraumatic.  Cardiovascular: Normal rate, regular rhythm and normal  heart sounds.   Pulmonary/Chest: Effort normal and breath sounds normal.  Abdominal: Soft. Bowel sounds are normal. She exhibits distension. She exhibits no mass. There is tenderness. There is no rebound and no guarding. Hernia confirmed negative in the right inguinal area and confirmed negative in the  left inguinal area.  He has some firmness over the mid abdomen as well as some tenderness in the left lower quadrant near the umbilicus.  I was unable to palpate a distinct hernia in the right groin.  Genitourinary: Vagina normal. Cervix exhibits discharge. Cervix exhibits no motion tenderness and no friability.  Genitourinary Comments: Clear thin d/c  Lymphadenopathy:       Right: No inguinal adenopathy present.       Left: No inguinal adenopathy present.  Neurological: She is alert and oriented to person, place, and time.  Skin: Skin is warm and dry.  Psychiatric: She has a normal mood and affect. Her behavior is normal.       Assessment & Plan:  Lower abdominal/pelvic pain-she does have some firmness firmness in the lower abdomen.  Recommend further imaging with ultrasound at this point.  Wet prep performed.  Will call results once available.  Also have her do a urinalysis to rule out UTI.  She did have a CT of abdomen and pelvis June 24, 2017.  Did not show any concerning acute features.  Just showed some emphysematous changes and fibrosis at the base of the lungs.  As well as some mild bile duct and pancreatic duct dilatation.  Has a 1.9 cm left adrenal gland nodule but there was no significant change.  Pain in the right groin crease-initially suspected groin herniation but was unable to actually palpate a distinct hernia.  Also consider osteoarthritis.  Foul smelling stool -start with abdominal ultrasound.  Consider further workup with stool culture.

## 2017-09-21 LAB — WET PREP FOR TRICH, YEAST, CLUE
MICRO NUMBER:: 81178296
Specimen Quality: ADEQUATE

## 2017-09-21 LAB — URINALYSIS, MICROSCOPIC ONLY
BACTERIA UA: NONE SEEN /HPF
Hyaline Cast: NONE SEEN /LPF

## 2017-09-21 MED ORDER — FLUCONAZOLE 150 MG PO TABS: 150.0000 mg | ORAL_TABLET | Freq: Once | ORAL | 0 refills | Status: AC

## 2017-09-21 NOTE — Addendum Note (Signed)
Addended by: Beatrice Lecher D on: 09/21/2017 08:21 AM   Modules accepted: Orders

## 2017-09-27 ENCOUNTER — Ambulatory Visit (INDEPENDENT_AMBULATORY_CARE_PROVIDER_SITE_OTHER): Payer: Medicare HMO

## 2017-09-27 DIAGNOSIS — R102 Pelvic and perineal pain: Secondary | ICD-10-CM

## 2017-09-27 DIAGNOSIS — N760 Acute vaginitis: Secondary | ICD-10-CM

## 2017-09-27 LAB — CYTOLOGY - PAP
Diagnosis: UNDETERMINED — AB
HPV (WINDOPATH): DETECTED — AB
HPV 16/18/45 genotyping: POSITIVE — AB

## 2017-09-28 ENCOUNTER — Telehealth: Payer: Self-pay | Admitting: Family Medicine

## 2017-09-28 DIAGNOSIS — R102 Pelvic and perineal pain: Secondary | ICD-10-CM

## 2017-09-28 DIAGNOSIS — R87619 Unspecified abnormal cytological findings in specimens from cervix uteri: Secondary | ICD-10-CM

## 2017-09-28 NOTE — Telephone Encounter (Signed)
Please place referral fow down the hall for abnormal pap and pelvic pain

## 2017-09-28 NOTE — Telephone Encounter (Signed)
Patient called adv that she would like the referral for Ob/Gyn down the hall. Thanks

## 2017-09-29 NOTE — Telephone Encounter (Signed)
Pt advised.

## 2017-10-03 IMAGING — CT CT CHEST W/O CM
3 of 4 series · 16 of 30 positions shown, 18 images · non-contrast
Comparison: 08/24/2016

CLINICAL DATA: Follow-up left lung nodule.

EXAM:
CT CHEST WITHOUT CONTRAST
TECHNIQUE: Multidetector CT imaging of the chest was performed following the
standard protocol without IV contrast.

[Series 3: chest w/o · axial · non-contrast · 0.70mm/px · z∈[-262,-2]mm · 7 of 139 slices shown, 9 images]
[im 18/139  mediastinal]
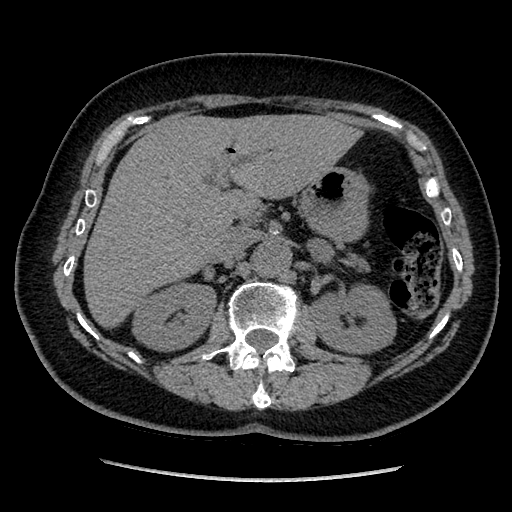
[im 18/139  lung]
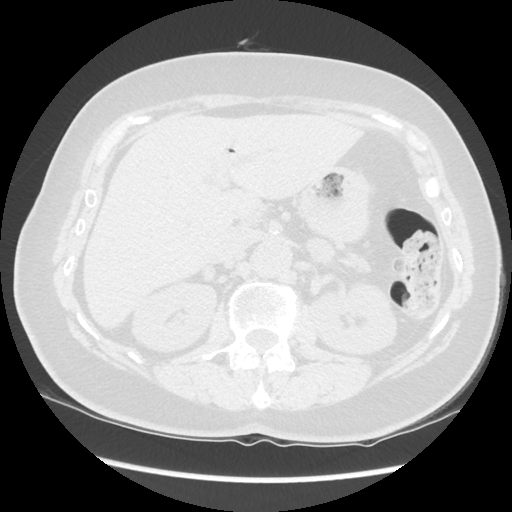
[im 35/139  lung]
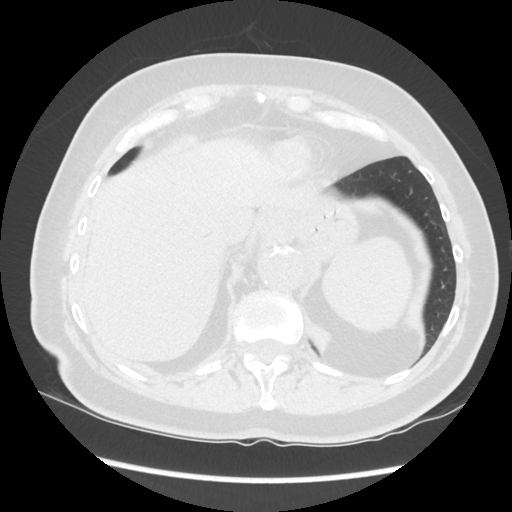
[im 52/139  lung]
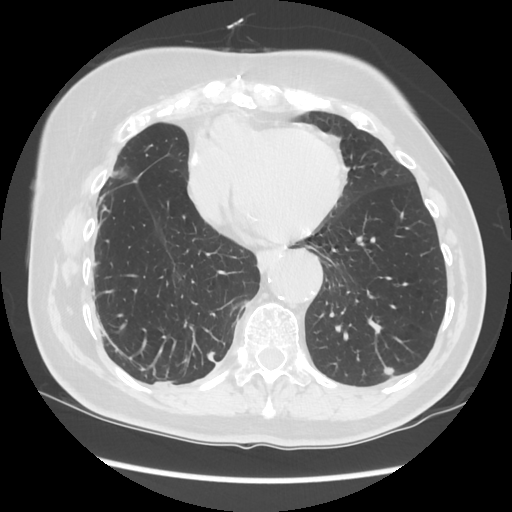
[im 70/139  lung]
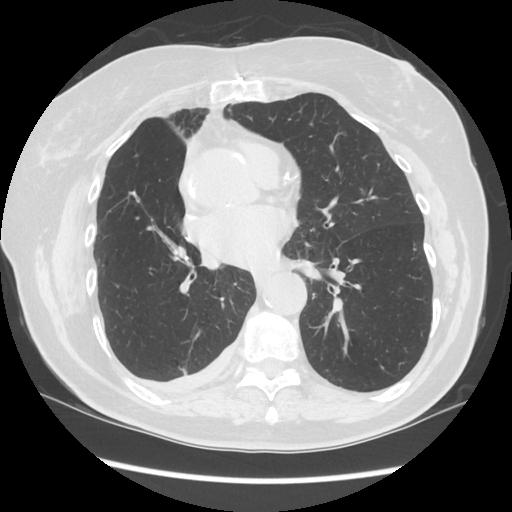
[im 87/139  mediastinal]
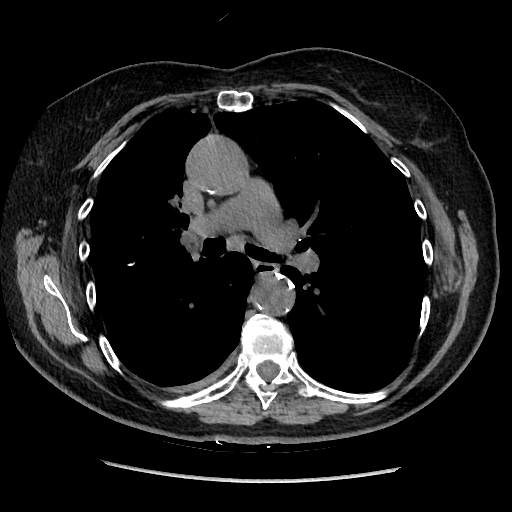
[im 87/139  lung]
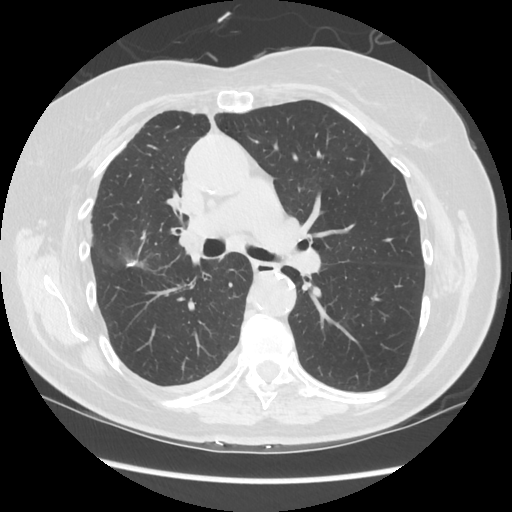
[im 104/139  lung]
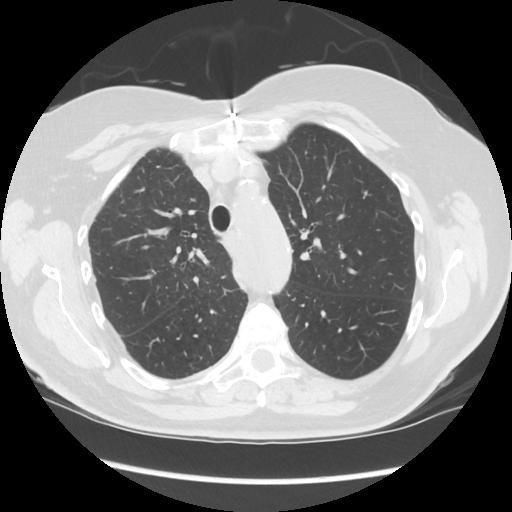
[im 121/139  lung]
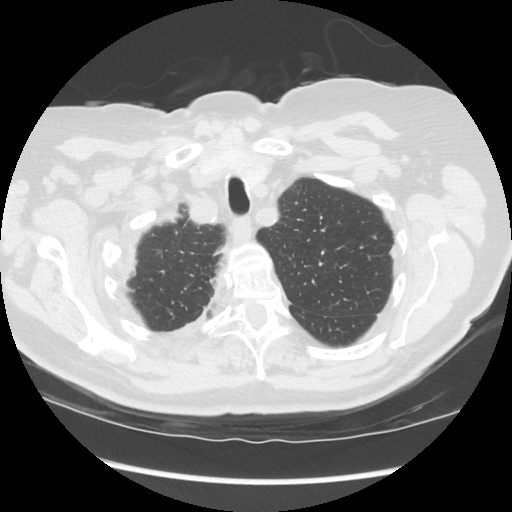

[Series 4: lung windows · axial · 0.70mm/px · z∈[-264,-2]mm · 7 of 141 slices shown]
[im 18/141  lung]
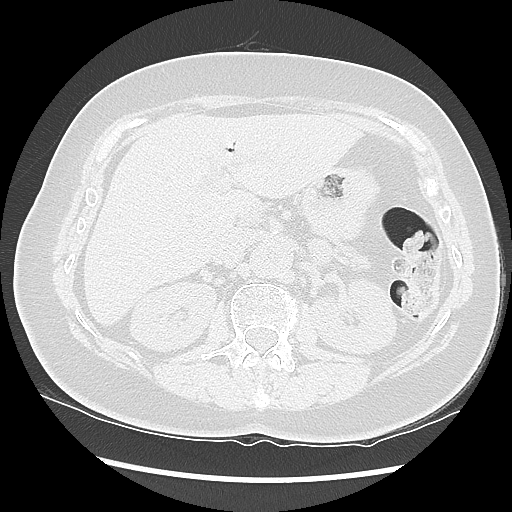
[im 36/141  lung]
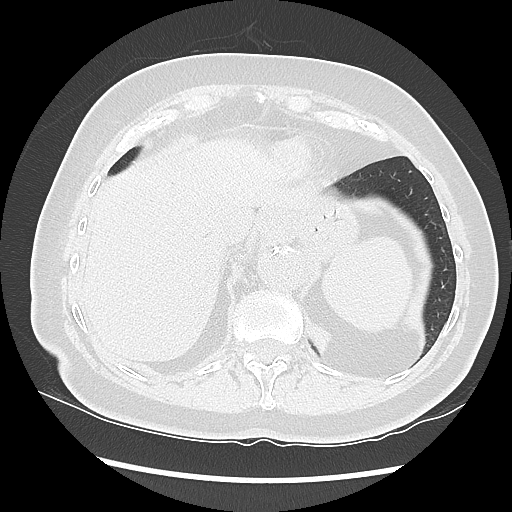
[im 53/141  lung]
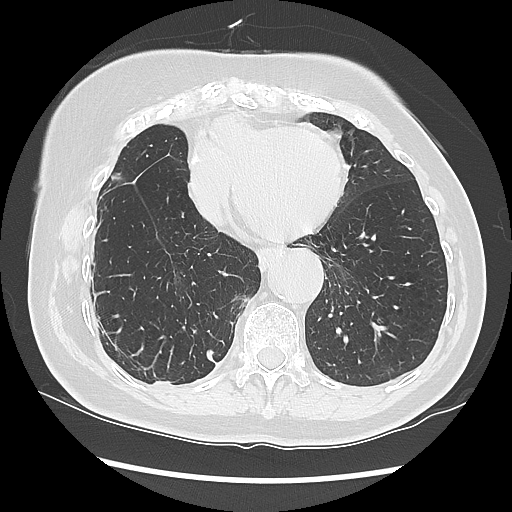
[im 71/141  lung]
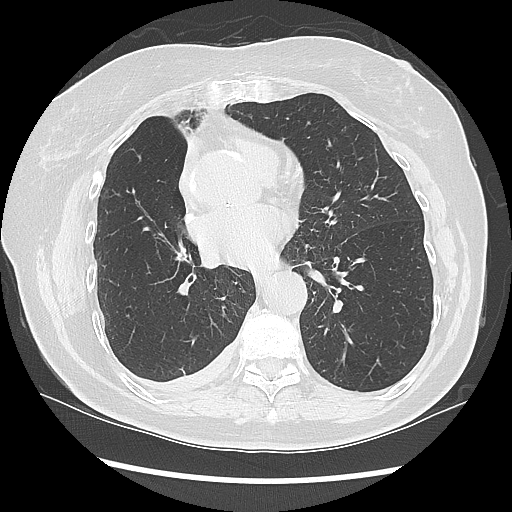
[im 88/141  lung]
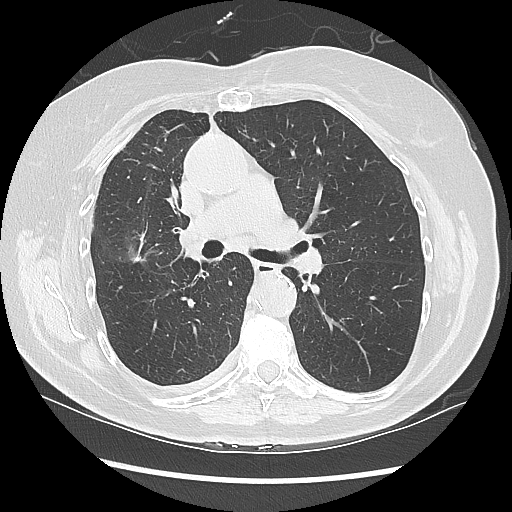
[im 106/141  lung]
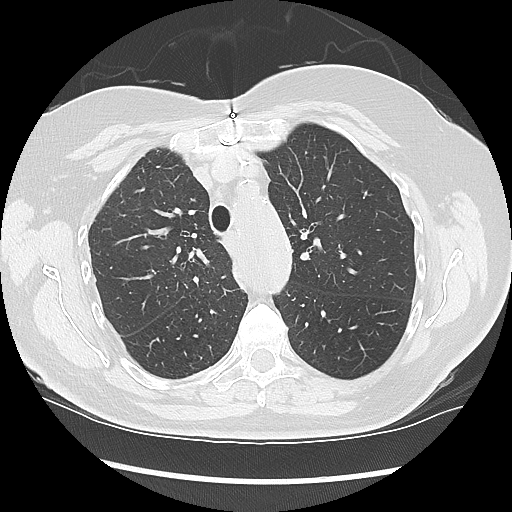
[im 123/141  lung]
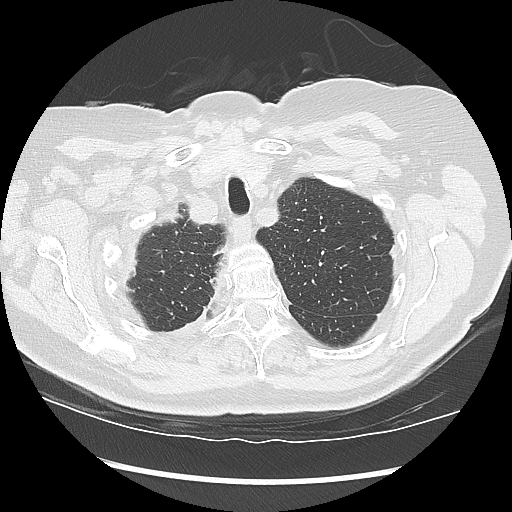

[Series 602: sagittal body · sagittal · 0.70mm/px · 2 of 145 slices shown]
[im 17/145  mediastinal]
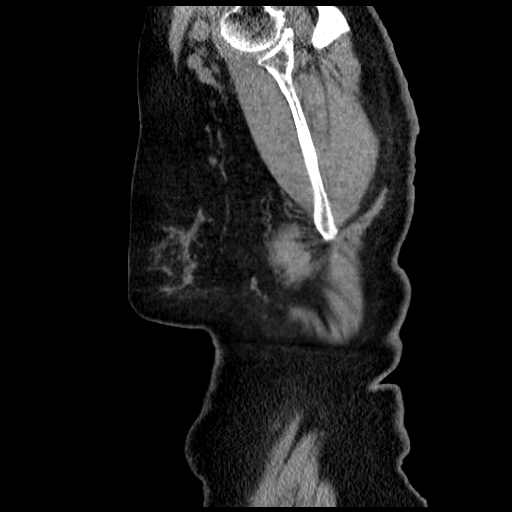
[im 33/145  mediastinal]
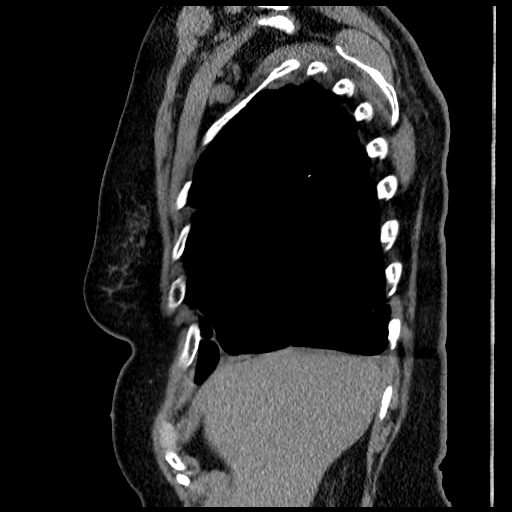

[16 of 30 positions shown; findings below may reference images not displayed]

FINDINGS: Cardiovascular: No cardiomegaly. Status post aortic valve
replacement. Diffuse atherosclerosis. Ascending aortic dilatation
measuring up to 42 mm. Distal descending aneurysmal enlargement with
unchanged left preferential bulging, 40 mm in maximal diameter.

Mediastinum/Nodes:   No evidence of adenopathy or mass.

Lungs/Pleura: Spiculated nodule in the left lower lobe, [DATE]
measures 7 x 4 mm, 6 mm average diameter. The nodule does appear
larger than on 8372 and 4964 CT, but axial images at that time were
5 mm, and there is a degree of slice selection. On coronal
reformats, the nodule measures 7 mm throughout scans.

Right middle lobectomy with pleural scarring.

There is no edema, consolidation, effusion, or pneumothorax.

Upper Abdomen: Pneumobilia and common bile duct dilatation. These
changes are chronic based on priors.

Chronic left adrenal nodule measuring 19 mm, multi year stability
consistent with adenoma.

Musculoskeletal: No acute finding. Suprarenal fusiform abdominal
aortic aneurysm measures up to 38 mm on coronal reformats, increased
from 9379 when it measured approximately 36 mm in the same plane.
IMPRESSION: 1. The 7 x 4 mm left lower lobe pulmonary nodule is stable. Although
size is more prominent than in 4964, this may be related to
different slice thickness between these 2 scans. On coronal
reformats there is no evident growth since 4964, benign behavior.
2. Right middle lobectomy.  Stable right pleural scarring.
3. Fusiform ascending aortic aneurysm measuring up to 42 mm,
unchanged from July 2016.
4. Distal descending fusiform aneurysm measuring up to 4 cm,
unchanged.
5. Infrarenal aortic aneurysm measuring 33 mm diameter.

## 2017-10-08 ENCOUNTER — Other Ambulatory Visit: Payer: Self-pay | Admitting: Family Medicine

## 2017-10-11 ENCOUNTER — Telehealth: Payer: Self-pay

## 2017-10-11 NOTE — Telephone Encounter (Signed)
Called pt left message to call office to schedule appointment. Received referral from Dr. Joellyn Quails office.

## 2017-10-19 ENCOUNTER — Ambulatory Visit (INDEPENDENT_AMBULATORY_CARE_PROVIDER_SITE_OTHER): Payer: Medicare HMO

## 2017-10-19 DIAGNOSIS — J449 Chronic obstructive pulmonary disease, unspecified: Secondary | ICD-10-CM

## 2017-10-19 DIAGNOSIS — I480 Paroxysmal atrial fibrillation: Secondary | ICD-10-CM

## 2017-10-19 DIAGNOSIS — R918 Other nonspecific abnormal finding of lung field: Secondary | ICD-10-CM | POA: Diagnosis not present

## 2017-10-29 ENCOUNTER — Telehealth: Payer: Self-pay | Admitting: Cardiology

## 2017-10-29 MED ORDER — METOPROLOL TARTRATE 25 MG PO TABS: 75.0000 mg | ORAL_TABLET | Freq: Two times a day (BID) | ORAL | 3 refills | Status: DC

## 2017-10-29 NOTE — Telephone Encounter (Signed)
ptcalling to get med changes

## 2017-10-29 NOTE — Telephone Encounter (Signed)
Returned call to pt informed that Dr Stanford Breed is [place] next week. She states that she having issues filling the metoprolol 75mg  and cutting the 50's in 1/2. Called the pharmacy they suggest to change to 25mg  tab 3 pills BID. S/w Dr [place] as Dr Stanford Breed is not in the office and DOD is unavailable and he states that this is fine to change. E-rx sent to requested pharmacy. Called pt back and informed her to start the new medication rx 3-25mg  . She states that she does not want to take 75mg  BID and she states that she would like to change down to 50mg  BID she states that she has not been taking her BP or HR lately or since she has changed to the 75mg  daily but she states that she knows that it is fine and this is why she wants to decrease medication further to the 50mg  BID, is this ok? She states that she will take her BP & HR BID for the next couple weeks to show him that numbers are good enough to change. She will call back and talk to [place] on Monday.

## 2017-10-29 NOTE — Telephone Encounter (Signed)
Pt calling regarding not being able to fins Toprol 75mg  and the 50's are too hard to cut in half and she fells she is not getting her correct dose, asking if she can go back to 50mg ? Pls advise

## 2017-11-02 MED ORDER — METOPROLOL TARTRATE 50 MG PO TABS: 75.0000 mg | ORAL_TABLET | Freq: Two times a day (BID) | ORAL | 3 refills | Status: DC

## 2017-11-02 MED ORDER — LISINOPRIL 40 MG PO TABS: 40.0000 mg | ORAL_TABLET | Freq: Every day | ORAL | 3 refills | Status: DC

## 2017-11-02 NOTE — Telephone Encounter (Signed)
Left message for pt to call.

## 2017-11-02 NOTE — Telephone Encounter (Signed)
Spoke with pt, her bp on the 75 mg of the metoprolol has been 129/70 59, 130/67 59 and 130/68 60. Explained to patient she needs to stay on the 75 mg dosage because her bp and pulse a great. Patient voiced understanding but prefers to take 1/2 of the 50 mg tablet instead of 3 of the 25 mg tablets. New script sent to the pharmacy

## 2017-11-04 ENCOUNTER — Ambulatory Visit (INDEPENDENT_AMBULATORY_CARE_PROVIDER_SITE_OTHER): Payer: Medicare HMO | Admitting: Obstetrics and Gynecology

## 2017-11-04 ENCOUNTER — Encounter: Payer: Self-pay | Admitting: Obstetrics and Gynecology

## 2017-11-04 VITALS — BP 159/83 | HR 63 | Resp 16 | Ht 66.0 in | Wt 180.0 lb

## 2017-11-04 DIAGNOSIS — R87610 Atypical squamous cells of undetermined significance on cytologic smear of cervix (ASC-US): Secondary | ICD-10-CM

## 2017-11-04 DIAGNOSIS — R87810 Cervical high risk human papillomavirus (HPV) DNA test positive: Secondary | ICD-10-CM | POA: Diagnosis not present

## 2017-11-04 DIAGNOSIS — N870 Mild cervical dysplasia: Secondary | ICD-10-CM | POA: Diagnosis not present

## 2017-11-04 NOTE — Progress Notes (Signed)
82 yo P2 with ASCUS +HPV on 08/2017 pap smear here for colposcopy  Patient given informed consent, signed copy in the chart, time out was performed.  Placed in lithotomy position. Cervix viewed with speculum and colposcope after application of acetic acid.   Colposcopy adequate?  No TZ not visualized Acetowhite lesions? Yes 7 o'clock Punctation? no Mosaicism?  no Abnormal vasculature?  no Biopsies? 7 o'clock ECC? yes  COMMENTS:  Patient was given post procedure instructions.  She will return in 2 weeks for results.  Mora Bellman, MD

## 2017-11-10 ENCOUNTER — Other Ambulatory Visit: Payer: Self-pay | Admitting: Family Medicine

## 2017-11-10 ENCOUNTER — Telehealth: Payer: Self-pay | Admitting: *Deleted

## 2017-11-10 DIAGNOSIS — F411 Generalized anxiety disorder: Secondary | ICD-10-CM

## 2017-11-10 NOTE — Telephone Encounter (Signed)
LM on pt's voicemail of biopsy results and repeat pap in 1 year.

## 2017-11-10 NOTE — Telephone Encounter (Signed)
-----   Message from Mora Bellman, MD sent at 11/09/2017  5:54 PM EST ----- Please inform patient of cervical biopsy consistent with pap smear. Plan is to repeat pap smear in 1 year  Thanks  Peggy

## 2017-11-12 ENCOUNTER — Telehealth: Payer: Self-pay

## 2017-11-12 NOTE — Telephone Encounter (Signed)
Patient called about getting an antibiotic for a toothache. I called and left a message advising patient to go to Urgent Care.

## 2017-12-07 ENCOUNTER — Other Ambulatory Visit: Payer: Self-pay | Admitting: *Deleted

## 2017-12-07 MED ORDER — RIVAROXABAN 20 MG PO TABS: 20.0000 mg | ORAL_TABLET | Freq: Every day | ORAL | 3 refills | Status: DC

## 2017-12-07 NOTE — Telephone Encounter (Signed)
Patient called needing refill on Xarelto [facility] Previous Rx looks as thought printed instead of going via computer

## 2017-12-15 ENCOUNTER — Other Ambulatory Visit: Payer: Self-pay | Admitting: Family Medicine

## 2017-12-15 DIAGNOSIS — F411 Generalized anxiety disorder: Secondary | ICD-10-CM

## 2017-12-15 NOTE — Telephone Encounter (Signed)
Please call patient: I did refill the alprazolam but just remind her she really needs to be using this sparingly.  Soaks twice a day as needed not on a maintenance basis and encouraged her to use half at times when she feels that she does not need a whole tab.  As we discussed previously chronic use increases her risk of dementia and for falls and fractures.  So I really do want her using this medication as minimally as possible.

## 2017-12-17 NOTE — Telephone Encounter (Signed)
Pt given recommendations. She voiced understanding. Reminded of f/u appt on Monday.Laurie [name] [place]

## 2017-12-20 ENCOUNTER — Ambulatory Visit: Payer: Medicare HMO

## 2018-01-06 ENCOUNTER — Telehealth: Payer: Self-pay | Admitting: Cardiology

## 2018-01-06 NOTE — Telephone Encounter (Signed)
Patient complaining of SOB that gets worse with activity. Patient stated she has some weakness, and swelling in her ankles and feet. Patient stated her BP is fine and her HR stays in the 50's. Patient would like to see Dr. Stanford Breed in [place]. Informed patient that she probably needs to see someone sooner, and could she come to one of our other offices. Patient refuses to go to another office. Informed patient that her message would be sent to Dr. Stanford Breed and his nurse for advisement, but if her SOB gets worse to go the urgent care or ED. Patient stated she did not want to do that either.

## 2018-01-06 NOTE — Telephone Encounter (Signed)
Fu in [place] next available [employer]

## 2018-01-06 NOTE — Telephone Encounter (Signed)
New message  Pt c/o Shortness Of Breath: STAT if SOB developed within the last 24 hours or pt is noticeably SOB on the phone  1. Are you currently SOB (can you hear that pt is SOB on the phone)? Yes  2. How long have you been experiencing SOB? she's had it for a while but its became worse in the past couple of weeks  3. Are you SOB when sitting or when up moving around? both  4. Are you currently experiencing any other symptoms? no

## 2018-01-07 NOTE — Telephone Encounter (Signed)
Left message for patient to call and schedule follow up in the [place] office in march if she wants to be seen sooner than the opening in [place].

## 2018-01-11 ENCOUNTER — Ambulatory Visit (INDEPENDENT_AMBULATORY_CARE_PROVIDER_SITE_OTHER): Payer: Medicare HMO | Admitting: Family Medicine

## 2018-01-11 ENCOUNTER — Encounter: Payer: Self-pay | Admitting: Family Medicine

## 2018-01-11 VITALS — BP 136/84 | HR 82 | Ht 66.0 in | Wt 181.0 lb

## 2018-01-11 DIAGNOSIS — F411 Generalized anxiety disorder: Secondary | ICD-10-CM

## 2018-01-11 DIAGNOSIS — J439 Emphysema, unspecified: Secondary | ICD-10-CM | POA: Diagnosis not present

## 2018-01-11 DIAGNOSIS — R0602 Shortness of breath: Secondary | ICD-10-CM | POA: Diagnosis not present

## 2018-01-11 DIAGNOSIS — J449 Chronic obstructive pulmonary disease, unspecified: Secondary | ICD-10-CM

## 2018-01-11 DIAGNOSIS — I10 Essential (primary) hypertension: Secondary | ICD-10-CM | POA: Diagnosis not present

## 2018-01-11 MED ORDER — PREDNISONE 20 MG PO TABS
40.0000 mg | ORAL_TABLET | Freq: Every day | ORAL | 0 refills | Status: DC
Start: 2018-01-11 — End: 2018-02-25

## 2018-01-11 NOTE — Progress Notes (Signed)
Subjective:    CC: COPD   HPI:  F/U COPD - Having more SOB recently that is worse with activity x 3 weeks.  She has had some swelling in her ankles but reports her BP has been normal.  No chest pain but has had some left axillary pain.  She denies any cough or congestion.  No upper respiratory symptoms.  She is been checking her blood pressures at home and most the time her systolic is running in the 130s.  Her pulse is staying in the upper 50s to low 60s most days.  She also notes that over the last couple days her stomach has started to feel sore again.  She wonders if she could actually have diverticulitis.  Though she has not had a fever.  Hypertension- Pt denies chest pain, SOB, dizziness, or heart palpitations.  Taking meds as directed w/o problems.  Denies medication side effects.    F/U GAD -uses her Xanax occasionally.  She needs form completed for DMV.    Past medical history, Surgical history, Family history not pertinant except as noted below, Social history, Allergies, and medications have been entered into the medical record, reviewed, and corrections made.   Review of Systems: No fevers, chills, night sweats, weight loss, chest pain, or shortness of breath.   Objective:    General: Well Developed, well nourished, and in no acute distress.  Neuro: Alert and oriented x3, extra-ocular muscles intact, sensation grossly intact.  HEENT: Normocephalic, atraumatic  Skin: Warm and dry, no rashes. Cardiac: Regular rate and rhythm, no murmurs rubs or gallops, no lower extremity edema.  Respiratory: diffuse rhonchi. Not using accessory muscles, speaking in full sentences.   Impression and Recommendations:    Shortness of breath-she definitely had some coarse diffuse breath sounds on exam today as well as some rhonchi.  I do wonder if some of this could be more pulmonary in nature.  She is not having any chest pain and blood pressures and pulse have been relatively stable.  EKG today  shows normal sinus rhythm with rate of 58 bpm.  Some left ventricular hypertrophy.  Inverted T waves in the lateral leads.  Unchanged from previous.  I really do feel that the shortness of breath is most likely related to her lungs versus a cardiac issue.  She thinks she has an albuterol inhaler at home that she will look for this evening and encouraged her to start doing 2 puffs up to 3 times a day as well as putting her in about 5 days of prednisone.  I want to see her back in 2-3 days to make sure that she is improving.  GAD -  STable.  Refilled her medication last month.  HTN - Well controlled. Continue current regimen. Follow up in  4 months.    COPD -with 6-minute walk test she only dropped to 90%.  She did feel short of breath and increased rate of breathing but she only dropped to 90%. She thinks she has an albuterol inhaler at home that she will look for this evening and encouraged her to start doing 2 puffs up to 3 times a day as well as putting her in about 5 days of prednisone.  I want to see her back in 2-3 days to make sure that she is improving.

## 2018-01-11 NOTE — Patient Instructions (Signed)
Try to find your albuterol ( ventolin, proventil, or proair).  Start 2 puffs 3 times a day. I am also going to send over prescription for prednisone. Please call us back if you cannot find her albuterol.

## 2018-01-13 ENCOUNTER — Other Ambulatory Visit: Payer: Self-pay | Admitting: Family Medicine

## 2018-01-13 ENCOUNTER — Ambulatory Visit (INDEPENDENT_AMBULATORY_CARE_PROVIDER_SITE_OTHER): Payer: Medicare HMO

## 2018-01-13 ENCOUNTER — Ambulatory Visit (INDEPENDENT_AMBULATORY_CARE_PROVIDER_SITE_OTHER): Payer: Medicare HMO | Admitting: Family Medicine

## 2018-01-13 ENCOUNTER — Encounter: Payer: Self-pay | Admitting: Family Medicine

## 2018-01-13 VITALS — BP 141/70 | HR 67 | Ht 66.0 in | Wt 180.0 lb

## 2018-01-13 DIAGNOSIS — J439 Emphysema, unspecified: Secondary | ICD-10-CM | POA: Diagnosis not present

## 2018-01-13 DIAGNOSIS — R0602 Shortness of breath: Secondary | ICD-10-CM | POA: Diagnosis not present

## 2018-01-13 DIAGNOSIS — J449 Chronic obstructive pulmonary disease, unspecified: Secondary | ICD-10-CM

## 2018-01-13 DIAGNOSIS — R221 Localized swelling, mass and lump, neck: Secondary | ICD-10-CM

## 2018-01-13 DIAGNOSIS — E049 Nontoxic goiter, unspecified: Secondary | ICD-10-CM | POA: Diagnosis not present

## 2018-01-13 DIAGNOSIS — R51 Headache: Secondary | ICD-10-CM | POA: Diagnosis not present

## 2018-01-13 DIAGNOSIS — R519 Headache, unspecified: Secondary | ICD-10-CM

## 2018-01-13 DIAGNOSIS — R590 Localized enlarged lymph nodes: Secondary | ICD-10-CM | POA: Diagnosis not present

## 2018-01-13 LAB — COMPLETE METABOLIC PANEL WITH GFR
AG RATIO: 2 (calc) (ref 1.0–2.5)
ALKALINE PHOSPHATASE (APISO): 83 U/L (ref 33–130)
ALT: 16 U/L (ref 6–29)
AST: 15 U/L (ref 10–35)
Albumin: 4.2 g/dL (ref 3.6–5.1)
BUN: 9 mg/dL (ref 7–25)
CALCIUM: 9.2 mg/dL (ref 8.6–10.4)
CHLORIDE: 95 mmol/L — AB (ref 98–110)
CO2: 30 mmol/L (ref 20–32)
Creat: 0.84 mg/dL (ref 0.60–0.88)
GFR, Est African American: 76 mL/min/{1.73_m2} (ref >=60)
GFR, Est Non African American: 66 mL/min/{1.73_m2} (ref >=60)
GLUCOSE: 146 mg/dL — AB (ref 65–99)
Globulin: 2.1 g/dL (calc) (ref 1.9–3.7)
POTASSIUM: 3.9 mmol/L (ref 3.5–5.3)
SODIUM: 131 mmol/L — AB (ref 135–146)
Total Bilirubin: 0.5 mg/dL (ref 0.2–1.2)
Total Protein: 6.3 g/dL (ref 6.1–8.1)

## 2018-01-13 LAB — CBC
HEMATOCRIT: 44.1 % (ref 35.0–45.0)
Hemoglobin: 15.3 g/dL (ref 11.7–15.5)
MCH: 30.5 pg (ref 27.0–33.0)
MCHC: 34.7 g/dL (ref 32.0–36.0)
MCV: 88 fL (ref 80.0–100.0)
MPV: 10.9 fL (ref 7.5–12.5)
Platelets: 264 10*3/uL (ref 140–400)
RBC: 5.01 10*6/uL (ref 3.80–5.10)
RDW: 12 % (ref 11.0–15.0)
WBC: 13.5 10*3/uL — ABNORMAL HIGH (ref 3.8–10.8)

## 2018-01-13 LAB — TSH: TSH: 1.64 m[IU]/L (ref 0.40–4.50)

## 2018-01-13 MED ORDER — KETOROLAC TROMETHAMINE 60 MG/2ML IM SOLN
60.0000 mg | Freq: Once | INTRAMUSCULAR | Status: AC
  Administered 2018-01-13: 60 mg via INTRAMUSCULAR

## 2018-01-13 NOTE — Progress Notes (Addendum)
Subjective:    Patient ID: Laurie [name], female    DOB: [birth year], 83 y.o.   MRN: [id number]  HPI F/U COPD - she was seen 2 days ago for shortness of breath.  She had some coarse diffuse breath sounds on exam and with a 6-minute walk test she was dropping to 90%.  I put her on a course of prednisone and encouraged her to pick up her albuterol inhaler.  She did take the prednisone but was not sure how to use the inhaler so did not use it.  Overall she does feel like her breathing is a little bit better.  But now she is concerned about headaches.    She is been getting some headaches over the left side of her head radiating from the temple front to behind her ear.  This morning it was quite severe in fact she said she almost went to the emergency department.  She is also having some slight tenderness and pain on the left side of her neck.  She says she is felt a lump there for quite a while but feels like it has been getting a little bigger and at times says it almost like a sharp pain in that area.  The area that she is pointing to is over the distal sternocleidomastoid muscle.    Says yesterday her stomach hurt. She says  It feels like "someone scraped her gut out" and then she had a very foul smelling BM.  She thinks some food may have rotten in her gut.   But today it actually feels a little bit better.  No change in the stool or bowel movement.   Review of Systems  BP (!) 141/70   Pulse 67   Ht 5\' 6"  (1.676 m)   Wt 180 lb (81.6 kg)   SpO2 90%   BMI 29.05 kg/m     Allergies  Allergen Reactions  . Hyoscyamine Other (See Comments)    Mental status changes.  (as of 02/13/16 patient denies any problems with this medication)  . Rofecoxib Anaphylaxis and Hives  . Statins Other (See Comments)    Muscle aches    Past Medical History:  Diagnosis Date  . AAA (abdominal aortic aneurysm) ([place])   . Anemia    iron deficiency  . Ascending aorta dilatation ([facility])   . Atrial fibrillation  ([place])    post op  . CAD (coronary artery disease)   . Carcinoma of lung ([place]) 08/28/2008   T2N0 moderately diff. squamous cell CA  . Cerebrovascular disease   . COPD (chronic obstructive pulmonary disease) ([place]) 2005   on CXR with R apical scaring  . Headache(784.0)    Hx: of migraines in the past  . Hypercholesterolemia    Hx: of  . Hypertension   . Incidental pulmonary nodule, > 45mm and < 18mm    LLL  . S/P aortic valve replacement 08/28/2008   [facility]en  . S/P lobectomy of lung 08/28/2008   rt middle lobe  dr. Roxy Manns  . Shingles 11-09    Past Surgical History:  Procedure Laterality Date  . ABDOMINAL SURGERY    . AORTIC VALVE REPLACEMENT  08/28/2008   #21mm [facility] Ease pericardial tissue valve  . APPENDECTOMY    . CARDIAC CATHETERIZATION    . CATARACT EXTRACTION W/ INTRAOCULAR LENS  IMPLANT, BILATERAL Bilateral   . CHOLECYSTECTOMY N/A 12/13/2013   Procedure: LAPAROSCOPIC CHOLECYSTECTOMY;  Surgeon: Ralene Ok, MD;  Location: [facility];  Service:  General;  Laterality: N/A;  . COLONOSCOPY     \  . DILATION AND CURETTAGE OF UTERUS    . ERCP N/A 05/08/2015   Procedure: ENDOSCOPIC RETROGRADE CHOLANGIOPANCREATOGRAPHY (ERCP);  Surgeon: Inda Castle, MD;  Location: [facility];  Service: Endoscopy;  Laterality: N/A;  . ERCP N/A 01/13/2016   Procedure: ENDOSCOPIC RETROGRADE CHOLANGIOPANCREATOGRAPHY (ERCP);  Surgeon: Doran Stabler, MD;  Location: [facility];  Service: Endoscopy;  Laterality: N/A;  . ERCP N/A 03/09/2016   Procedure: ENDOSCOPIC RETROGRADE CHOLANGIOPANCREATOGRAPHY (ERCP);  Surgeon: Ladene Artist, MD;  Location: [facility];  Service: Endoscopy;  Laterality: N/A;  . RML removed Dr Ricard Dillon for lung cancer  08/28/2008   T2N0 squamous cell CA  . TONSILLECTOMY AND ADENOIDECTOMY    . u/s guided aspiration of R breast cyst   2009   at the breast clinic    Social History   Socioeconomic History  . Marital status: Single    Spouse name: Not on file  . Number of  children: Not on file  . Years of education: Not on file  . Highest education level: Not on file  Social Needs  . Financial resource strain: Not on file  . Food insecurity - worry: Not on file  . Food insecurity - inability: Not on file  . Transportation needs - medical: Not on file  . Transportation needs - non-medical: Not on file  Occupational History  . Not on file  Tobacco Use  . Smoking status: Former Smoker    Years: 40.00  . Smokeless tobacco: Never Used  Substance and Sexual Activity  . Alcohol use: No    Alcohol/week: 0.0 oz  . Drug use: No  . Sexual activity: Not on file  Other Topics Concern  . Not on file  Social History Narrative  . Not on file    Family History  Problem Relation Age of Onset  . Stroke Father   . Diabetes Son        brittle diabetes  . Colon polyps Neg Hx   . Colon cancer Neg Hx   . Esophageal cancer Neg Hx   . Stomach cancer Neg Hx   . Pancreatic cancer Neg Hx   . Kidney disease Neg Hx   . Liver disease Neg Hx     Outpatient Encounter Medications as of 01/13/2018  Medication Sig  . acetaminophen (TYLENOL) 500 MG tablet Take 500 mg by mouth every 6 (six) hours as needed (for pain).   Marland Kitchen ALPRAZolam (XANAX) 0.5 MG tablet Take 0.5-10 tablets (0.25-5 mg total) by mouth 2 (two) times daily as needed.  Marland Kitchen amiodarone (PACERONE) 200 MG tablet Take 1 tablet (200 mg total) by mouth daily.  Marland Kitchen lisinopril (PRINIVIL,ZESTRIL) 40 MG tablet Take 1 tablet (40 mg total) by mouth daily.  . metoprolol tartrate (LOPRESSOR) 50 MG tablet Take 1.5 tablets (75 mg total) by mouth 2 (two) times daily.  . predniSONE (DELTASONE) 20 MG tablet Take 2 tablets (40 mg total) by mouth daily with breakfast.  . rivaroxaban (XARELTO) 20 MG TABS tablet Take 1 tablet (20 mg total) by mouth daily with supper.  . traMADol (ULTRAM) 50 MG tablet Take 1 tablet (50 mg total) by mouth 3 (three) times daily as needed. for pain  . [EXPIRED] ketorolac (TORADOL) injection 60 mg    No  facility-administered encounter medications on file as of 01/13/2018.           Objective:   Physical Exam  Constitutional: She is  oriented to person, place, and time. She appears well-developed and well-nourished.  HENT:  Head: Normocephalic and atraumatic.  Right Ear: External ear normal.  Left Ear: External ear normal.  Nose: Nose normal.  Mouth/Throat: Oropharynx is clear and moist.  Left TM and canal is clear.   Eyes: Conjunctivae and EOM are normal. Pupils are equal, round, and reactive to light.  Neck: Neck supple. No thyromegaly present.  Unable to palpate a distinct lump on her neck.  Where she is pointing is over the distal end of the sternomastoid.  Cardiovascular: Normal rate, regular rhythm and normal heart sounds.  Pulmonary/Chest: Effort normal and breath sounds normal. She has no wheezes.  Diffuse rhonchi at the bases bilaerally.   Lymphadenopathy:    She has no cervical adenopathy.  Neurological: She is alert and oriented to person, place, and time.  Skin: Skin is warm and dry.  Psychiatric: She has a normal mood and affect.         Assessment & Plan:  COPD exacerbation -showed her how to use the albuterol inhaler today here in the office.  Should use 2 puffs every 6 hours while she is awake during the day and finish out the prednisone.  Make sure to take the prednisone with some food and water or a glass of milk.  If her stomach is hurting then she may need to stop it prematurely.  Left-sided neck pain and "mass"  near the sternocleidomastoid.  Unclear etiology.  I do not feel a distinct palpable mass but she is concerned that it has been present for a while and is sore and tender at times.  We will schedule for ultrasound for further evaluation. She did go for a CBC.    Left Sided headache-unclear etiology at this point.  Given Toradol given for acute relief today.  Unclear if could be a side effect of the prednisone though it sounds like she is been having  these headaches for a while.

## 2018-01-13 NOTE — Telephone Encounter (Signed)
Routed to pcp for signature.Elouise Munroe, [place]

## 2018-01-14 ENCOUNTER — Other Ambulatory Visit: Payer: Self-pay | Admitting: *Deleted

## 2018-01-14 DIAGNOSIS — R51 Headache: Principal | ICD-10-CM

## 2018-01-14 DIAGNOSIS — R519 Headache, unspecified: Secondary | ICD-10-CM

## 2018-01-14 NOTE — Progress Notes (Unsigned)
eck

## 2018-01-17 DIAGNOSIS — R51 Headache: Secondary | ICD-10-CM | POA: Diagnosis not present

## 2018-01-18 ENCOUNTER — Encounter (HOSPITAL_BASED_OUTPATIENT_CLINIC_OR_DEPARTMENT_OTHER): Payer: Self-pay

## 2018-01-18 ENCOUNTER — Ambulatory Visit (HOSPITAL_BASED_OUTPATIENT_CLINIC_OR_DEPARTMENT_OTHER)
Admission: RE | Admit: 2018-01-18 | Discharge: 2018-01-18 | Disposition: A | Discharge status: Home / Self Care | Payer: Medicare HMO | Source: Ambulatory Visit | Attending: Family Medicine | Admitting: Family Medicine

## 2018-01-18 DIAGNOSIS — R221 Localized swelling, mass and lump, neck: Secondary | ICD-10-CM | POA: Insufficient documentation

## 2018-01-18 LAB — SEDIMENTATION RATE: SED RATE: 2 mm/h (ref 0–30)

## 2018-01-18 LAB — C-REACTIVE PROTEIN: CRP: 0.8 mg/L (ref <8.0)

## 2018-01-18 MED ORDER — IOPAMIDOL (ISOVUE-300) INJECTION 61%
100.0000 mL | Freq: Once | INTRAVENOUS | Status: AC | PRN
  Administered 2018-01-18: 75 mL via INTRAVENOUS

## 2018-01-21 ENCOUNTER — Other Ambulatory Visit: Payer: Self-pay | Admitting: Family Medicine

## 2018-01-21 MED ORDER — AZITHROMYCIN 250 MG PO TABS: ORAL_TABLET | ORAL | 0 refills | Status: AC

## 2018-02-14 NOTE — Progress Notes (Signed)
HPI: FU atrial fibrillation, CAD, ho aortic stenosis status post aortic valve replacement in September 2009. She had a pericardial tissue valve; also had a septal myectomy and a right middle lobectomy due to lung CA. Note, she had a 50% mid LAD by catheterization preoperatively. Echocardiogram April 2017 showed normal LV function, grade 2 diastolic dysfunction, prior aortic valve replacement with mean gradient 6 mmHg. Carotid Dopplers November 2017 showed mild bilateral atherosclerosis and no hemodynamically significant stenosis. Patient seen in the emergency room March 2018 with recurrent atrial fibrillation and underwent cardioversion. CTA April 2018 showed stable pulmonary nodule. There was a 42 mm ascending aortic aneurysm and 4 cm distal thoracic aneurysm. There was an abdominal aortic aneurysm measuring 33 mm. Dopplers 5/18: ABI of 0.57 on the right and 0.76 on the left. Waveforms were suggestive of inflow disease. Duplex showed significant distal aortic and bilateral common iliac artery stenosis with moderate left SFA stenosis.Seen by Dr Fletcher Anon and medical therapy recommended for now. Amiodarone added at last ov for recurrent atrial fibrillation. Since I last saw her, patient describes occasional dyspnea.  No chest pain.  She has had problems with back and neck pain.  No pedal edema or syncope.  She feels her heart "beat".  Current Outpatient Medications  Medication Sig Dispense Refill  . acetaminophen (TYLENOL) 500 MG tablet Take 500 mg by mouth every 6 (six) hours as needed (for pain).     Marland Kitchen ALPRAZolam (XANAX) 0.5 MG tablet TAKE 0.5-1 TABLETS (0.25-5 MG TOTAL) BY MOUTH 2 (TWO) TIMES DAILY AS NEEDED. 60 tablet 1  . amiodarone (PACERONE) 200 MG tablet Take 1 tablet (200 mg total) by mouth daily. 97 tablet 3  . lisinopril (PRINIVIL,ZESTRIL) 40 MG tablet Take 1 tablet (40 mg total) by mouth daily. 90 tablet 3  . predniSONE (DELTASONE) 20 MG tablet Take 2 tablets (40 mg total) by mouth daily  with breakfast. 10 tablet 0  . rivaroxaban (XARELTO) 20 MG TABS tablet Take 1 tablet (20 mg total) by mouth daily with supper. 90 tablet 3  . traMADol (ULTRAM) 50 MG tablet TAKE 1 TABLET BY MOUTH 3 TIMES DAILY AS NEEDED FOR PAIN 90 tablet 2  . metoprolol tartrate (LOPRESSOR) 50 MG tablet Take 1.5 tablets (75 mg total) by mouth 2 (two) times daily. 90 tablet 3   No current facility-administered medications for this visit.      Past Medical History:  Diagnosis Date  . AAA (abdominal aortic aneurysm) ([place])   . Anemia    iron deficiency  . Ascending aorta dilatation ([facility])   . Atrial fibrillation ([place])    post op  . CAD (coronary artery disease)   . Carcinoma of lung ([place]) 08/28/2008   T2N0 moderately diff. squamous cell CA  . Cerebrovascular disease   . COPD (chronic obstructive pulmonary disease) ([place]) 2005   on CXR with R apical scaring  . Headache(784.0)    Hx: of migraines in the past  . Hypercholesterolemia    Hx: of  . Hypertension   . Incidental pulmonary nodule, > 66mm and < 93mm    LLL  . S/P aortic valve replacement 08/28/2008   [facility]  . S/P lobectomy of lung 08/28/2008   rt middle lobe  dr. Roxy Manns  . Shingles 11-09    Past Surgical History:  Procedure Laterality Date  . ABDOMINAL SURGERY    . AORTIC VALVE REPLACEMENT  08/28/2008   #74mm [facility] Ease pericardial tissue valve  . APPENDECTOMY    .  CARDIAC CATHETERIZATION    . CATARACT EXTRACTION W/ INTRAOCULAR LENS  IMPLANT, BILATERAL Bilateral   . CHOLECYSTECTOMY N/A 12/13/2013   Procedure: LAPAROSCOPIC CHOLECYSTECTOMY;  Surgeon: Ralene Ok, MD;  Location: [facility];  Service: General;  Laterality: N/A;  . COLONOSCOPY     \  . DILATION AND CURETTAGE OF UTERUS    . ERCP N/A 05/08/2015   Procedure: ENDOSCOPIC RETROGRADE CHOLANGIOPANCREATOGRAPHY (ERCP);  Surgeon: Inda Castle, MD;  Location: [facility];  Service: Endoscopy;  Laterality: N/A;  . ERCP N/A 01/13/2016   Procedure: ENDOSCOPIC RETROGRADE  CHOLANGIOPANCREATOGRAPHY (ERCP);  Surgeon: Doran Stabler, MD;  Location[facility];  Service: Endoscopy;  Laterality: N/A;  . ERCP N/A 03/09/2016   Procedure: ENDOSCOPIC RETROGRADE CHOLANGIOPANCREATOGRAPHY (ERCP);  Surgeon: Ladene Artist, MD;  Location: [facility];  Service: Endoscopy;  Laterality: N/A;  . RML removed Dr Ricard Dillon for lung cancer  08/28/2008   T2N0 squamous cell CA  . TONSILLECTOMY AND ADENOIDECTOMY    . u/s guided aspiration of R breast cyst   2009   at the breast clinic    Social History   Socioeconomic History  . Marital status: Single    Spouse name: Not on file  . Number of children: Not on file  . Years of education: Not on file  . Highest education level: Not on file  Occupational History  . Not on file  Social Needs  . Financial resource strain: Not on file  . Food insecurity:    Worry: Not on file    Inability: Not on file  . Transportation needs:    Medical: Not on file    Non-medical: Not on file  Tobacco Use  . Smoking status: Former Smoker    Years: 40.00  . Smokeless tobacco: Never Used  Substance and Sexual Activity  . Alcohol use: No    Alcohol/week: 0.0 oz  . Drug use: No  . Sexual activity: Not on file  Lifestyle  . Physical activity:    Days per week: Not on file    Minutes per session: Not on file  . Stress: Not on file  Relationships  . Social connections:    Talks on phone: Not on file    Gets together: Not on file    Attends religious service: Not on file    Active member of club or organization: Not on file    Attends meetings of clubs or organizations: Not on file    Relationship status: Not on file  . Intimate partner violence:    Fear of current or ex partner: Not on file    Emotionally abused: Not on file    Physically abused: Not on file    Forced sexual activity: Not on file  Other Topics Concern  . Not on file  Social History Narrative  . Not on file    Family History  Problem Relation Age of Onset  .  Stroke Father   . Diabetes Son        brittle diabetes  . Colon polyps Neg Hx   . Colon cancer Neg Hx   . Esophageal cancer Neg Hx   . Stomach cancer Neg Hx   . Pancreatic cancer Neg Hx   . Kidney disease Neg Hx   . Liver disease Neg Hx     ROS: Fatigue but no fevers or chills, productive cough, hemoptysis, dysphasia, odynophagia, melena, hematochezia, dysuria, hematuria, rash, seizure activity, orthopnea, PND, pedal edema, claudication. Remaining systems are negative.  Physical Exam: Well-developed well-nourished in no acute distress.  Skin is warm and dry.  HEENT is normal.  Neck is supple.  Chest is clear to auscultation with normal expansion.  Cardiovascular exam is bradycardic and regular rhythm.  2/6 systolic murmur left sternal border. Abdominal exam nontender or distended. No masses palpated. Extremities show no edema. neuro grossly intact  Electrocardiogram shows marked sinus bradycardia at a rate of 38.  First-degree AV block.  Left ventricular hypertrophy with repolarization abnormality.  A/P  1 prior aortic valve replacement-continue SBE prophylaxis.  2 thoracic aortic aneurysm, abdominal aortic aneurysm, iliac aneurysm-plan follow-up CTA April 2019.  3 hypertension-blood pressure is controlled but patient is markedly bradycardic and feels fatigued.  She is not having syncope.  I will hold metoprolol tonight.  She will then decrease from 75 mg twice daily to 25 mg twice daily.  I will have her seen on Friday to make sure that her heart rate is stable.  We will also need to follow her blood pressure given reduced dose of beta-blocker.  4 hyperlipidemia-continue diet.  Patient intolerant to statins.  5 coronary artery disease-patient is not on aspirin given need for anticoagulation.  She is intolerant to statins.  No chest pain.  6 paroxysmal atrial fibrillation-plan to continue on amiodarone.  Decrease metoprolol as outlined.  Check chest x-ray.  Continue Xarelto.      7 peripheral vascular disease-medical therapy has been recommended by Dr. Fletcher Anon.  She is not on aspirin given need for Xarelto.  She is not on statin as she is intolerant.  [name], MD

## 2018-02-16 ENCOUNTER — Other Ambulatory Visit: Payer: Self-pay | Admitting: Family Medicine

## 2018-02-16 DIAGNOSIS — F411 Generalized anxiety disorder: Secondary | ICD-10-CM

## 2018-02-18 ENCOUNTER — Other Ambulatory Visit: Payer: Self-pay | Admitting: *Deleted

## 2018-02-18 DIAGNOSIS — R911 Solitary pulmonary nodule: Secondary | ICD-10-CM

## 2018-02-23 ENCOUNTER — Ambulatory Visit (INDEPENDENT_AMBULATORY_CARE_PROVIDER_SITE_OTHER): Payer: Medicare HMO | Admitting: Cardiology

## 2018-02-23 ENCOUNTER — Encounter: Payer: Self-pay | Admitting: Cardiology

## 2018-02-23 ENCOUNTER — Ambulatory Visit (INDEPENDENT_AMBULATORY_CARE_PROVIDER_SITE_OTHER): Payer: Medicare HMO

## 2018-02-23 VITALS — BP 131/75 | HR 60 | Ht 66.0 in | Wt 179.0 lb

## 2018-02-23 DIAGNOSIS — I77810 Thoracic aortic ectasia: Secondary | ICD-10-CM | POA: Diagnosis not present

## 2018-02-23 DIAGNOSIS — I4891 Unspecified atrial fibrillation: Secondary | ICD-10-CM | POA: Diagnosis not present

## 2018-02-23 DIAGNOSIS — I10 Essential (primary) hypertension: Secondary | ICD-10-CM

## 2018-02-23 DIAGNOSIS — R911 Solitary pulmonary nodule: Secondary | ICD-10-CM

## 2018-02-23 DIAGNOSIS — I359 Nonrheumatic aortic valve disorder, unspecified: Secondary | ICD-10-CM | POA: Diagnosis not present

## 2018-02-23 DIAGNOSIS — J449 Chronic obstructive pulmonary disease, unspecified: Secondary | ICD-10-CM

## 2018-02-23 DIAGNOSIS — R05 Cough: Secondary | ICD-10-CM | POA: Diagnosis not present

## 2018-02-23 MED ORDER — METOPROLOL TARTRATE 25 MG PO TABS: 25.0000 mg | ORAL_TABLET | Freq: Two times a day (BID) | ORAL | 9 refills | Status: DC

## 2018-02-23 NOTE — Patient Instructions (Addendum)
Medication Instructions:  Your physician has recommended you make the following change in your medication:  1. Hold PM dose of Toprol tonight 2. Tomorrow decrease Toprol one tablet (25 mg ) am and one tablet (25mg  ) pm.    Labwork: -None  Testing/Procedures: A chest x-ray takes a picture of the organs and structures inside the chest, including the heart, lungs, and blood vessels. This test can show several things, including, whether the heart is enlarges; whether fluid is building up in the lungs; and whether pacemaker / defibrillator leads are still in place.   Follow-Up: Your physician recommends that you keep your scheduled  follow-up appointment on Friday Your physician recommends that you keep your scheduled  follow-up appointment with Dr. Stanford Breed.   Any Other Special Instructions Will Be Listed Below (If Applicable). Patient will call to let us know dose of Amlodipine pt is taking.    If you need a refill on your cardiac medications before your next appointment, please call your pharmacy.

## 2018-02-24 ENCOUNTER — Telehealth: Payer: Self-pay | Admitting: Cardiology

## 2018-02-24 NOTE — Telephone Encounter (Signed)
New message ° °Pt verbalized that she is returning call for RN °

## 2018-02-24 NOTE — Telephone Encounter (Signed)
SPOKE TO PATIENT RESULT GIVEN FOR CHEST XRAY  .  PATIENT STATES THAT AMLODIPINE 5 MG WAS NOT ON HER AVS  AT APPOINTMENT YESTERDAY .    RN REVIEWED MEDICATION WAS DISCONTINUED . PLACED BACK ON MEDIATION LIST

## 2018-02-25 ENCOUNTER — Encounter: Payer: Self-pay | Admitting: Cardiology

## 2018-02-25 ENCOUNTER — Ambulatory Visit: Payer: Medicare HMO | Admitting: Cardiology

## 2018-02-25 VITALS — BP 120/70 | HR 73 | Ht 66.0 in | Wt 181.4 lb

## 2018-02-25 DIAGNOSIS — R001 Bradycardia, unspecified: Secondary | ICD-10-CM

## 2018-02-25 DIAGNOSIS — Z952 Presence of prosthetic heart valve: Secondary | ICD-10-CM

## 2018-02-25 DIAGNOSIS — I480 Paroxysmal atrial fibrillation: Secondary | ICD-10-CM | POA: Diagnosis not present

## 2018-02-25 DIAGNOSIS — I359 Nonrheumatic aortic valve disorder, unspecified: Secondary | ICD-10-CM

## 2018-02-25 DIAGNOSIS — I714 Abdominal aortic aneurysm, without rupture, unspecified: Secondary | ICD-10-CM

## 2018-02-25 NOTE — Patient Instructions (Signed)
Medication Instructions:  Your physician recommends that you continue on your current medications as directed. Please refer to the Current Medication list given to you today.   Labwork: None  Testing/Procedures: You had an EKG today.  Follow-Up: Your physician recommends that you call Dr. Jacalyn Lefevre office to schedule a follow-up appointment.   Any Other Special Instructions Will Be Listed Below (If Applicable).     If you need a refill on your cardiac medications before your next appointment, please call your pharmacy.

## 2018-02-25 NOTE — Progress Notes (Signed)
Cardiology Office Note:    Date:  02/25/2018   ID:  Laurie [name], DOB [birth year], MRN [id number]  PCP:  Hali Marry, MD  Cardiologist:  Jenne Campus, MD    Referring MD: Hali Marry, *   Chief Complaint  Patient presents with  . Heart Rate per Dr. Stanford Breed  Doing much better  History of Present Illness:    Laurie [name] is a 83 y.o. female with quite complex past medical history and full details please look at excellent note by Dr. Stanford Breed.  He is so her just 2 days ago for regular follow-up and he noted her heart rate being only 38.  She was asked to follow-up with him within 2 days however she was not able to get to another office that is why she ended up in my office today. Marland Kitchen Appropriate changes in her medication has been made her metoprolol has been reduced from 75 twice daily to 25 twice a day.  She maintained on amiodarone for prevention of recurrences of atrial fibrillation.  Since that time she is doing much better she states she got much more energy and yesterday she was able to clean her house.  However today in the morning try to distract her son who was her caretaker died suddenly this morning.  Obviously she is very disturbed by that.  Past Medical History:  Diagnosis Date  . AAA (abdominal aortic aneurysm) ([place])   . Anemia    iron deficiency  . Ascending aorta dilatation (HCC)   . Atrial fibrillation ([place])    post op  . CAD (coronary artery disease)   . Carcinoma of lung ([place]) 08/28/2008   T2N0 moderately diff. squamous cell CA  . Cerebrovascular disease   . COPD (chronic obstructive pulmonary disease) ([place]) 2005   on CXR with R apical scaring  . Headache(784.0)    Hx: of migraines in the past  . Hypercholesterolemia    Hx: of  . Hypertension   . Incidental pulmonary nodule, > 54mm and < 90mm    LLL  . S/P aortic valve replacement 08/28/2008   [facility]  . S/P lobectomy of lung 08/28/2008   rt middle lobe  dr. Roxy Manns  . Shingles  11-09    Past Surgical History:  Procedure Laterality Date  . ABDOMINAL SURGERY    . AORTIC VALVE REPLACEMENT  08/28/2008   #30mm [facility] Ease pericardial tissue valve  . APPENDECTOMY    . CARDIAC CATHETERIZATION    . CATARACT EXTRACTION W/ INTRAOCULAR LENS  IMPLANT, BILATERAL Bilateral   . CHOLECYSTECTOMY N/A 12/13/2013   Procedure: LAPAROSCOPIC CHOLECYSTECTOMY;  Surgeon: Ralene Ok, MD;  Location: [place];  Service: General;  Laterality: N/A;  . COLONOSCOPY     \  . DILATION AND CURETTAGE OF UTERUS    . ERCP N/A 05/08/2015   Procedure: ENDOSCOPIC RETROGRADE CHOLANGIOPANCREATOGRAPHY (ERCP);  Surgeon: Inda Castle, MD;  Location: [place];  Service: Endoscopy;  Laterality: N/A;  . ERCP N/A 01/13/2016   Procedure: ENDOSCOPIC RETROGRADE CHOLANGIOPANCREATOGRAPHY (ERCP);  Surgeon: Doran Stabler, MD;  Location[facility];  Service: Endoscopy;  Laterality: N/A;  . ERCP N/A 03/09/2016   Procedure: ENDOSCOPIC RETROGRADE CHOLANGIOPANCREATOGRAPHY (ERCP);  Surgeon: Ladene Artist, MD;  Location: [facility];  Service: Endoscopy;  Laterality: N/A;  . RML removed Dr Ricard Dillon for lung cancer  08/28/2008   T2N0 squamous cell CA  . TONSILLECTOMY AND ADENOIDECTOMY    . u/s guided aspiration of R breast cyst  2009   at the breast clinic    Current Medications: Current Meds  Medication Sig  . acetaminophen (TYLENOL) 500 MG tablet Take 500 mg by mouth every 6 (six) hours as needed (for pain).   Marland Kitchen ALPRAZolam (XANAX) 0.5 MG tablet TAKE 0.5-1 TABLETS (0.25-5 MG TOTAL) BY MOUTH 2 (TWO) TIMES DAILY AS NEEDED.  Marland Kitchen amiodarone (PACERONE) 200 MG tablet Take 1 tablet (200 mg total) by mouth daily.  Marland Kitchen amLODipine (NORVASC) 5 MG tablet Take 5 mg by mouth daily.  Marland Kitchen lisinopril (PRINIVIL,ZESTRIL) 40 MG tablet Take 1 tablet (40 mg total) by mouth daily.  . metoprolol tartrate (LOPRESSOR) 25 MG tablet Take 1 tablet (25 mg total) by mouth 2 (two) times daily.  . rivaroxaban (XARELTO) 20 MG TABS  tablet Take 1 tablet (20 mg total) by mouth daily with supper.  . traMADol (ULTRAM) 50 MG tablet TAKE 1 TABLET BY MOUTH 3 TIMES DAILY AS NEEDED FOR PAIN     Allergies:   Hyoscyamine; Rofecoxib; and Statins   Social History   Socioeconomic History  . Marital status: Single    Spouse name: Not on file  . Number of children: Not on file  . Years of education: Not on file  . Highest education level: Not on file  Occupational History  . Not on file  Social Needs  . Financial resource strain: Not on file  . Food insecurity:    Worry: Not on file    Inability: Not on file  . Transportation needs:    Medical: Not on file    Non-medical: Not on file  Tobacco Use  . Smoking status: Former Smoker    Years: 40.00  . Smokeless tobacco: Never Used  Substance and Sexual Activity  . Alcohol use: No    Alcohol/week: 0.0 oz  . Drug use: No  . Sexual activity: Not on file  Lifestyle  . Physical activity:    Days per week: Not on file    Minutes per session: Not on file  . Stress: Not on file  Relationships  . Social connections:    Talks on phone: Not on file    Gets together: Not on file    Attends religious service: Not on file    Active member of club or organization: Not on file    Attends meetings of clubs or organizations: Not on file    Relationship status: Not on file  Other Topics Concern  . Not on file  Social History Narrative  . Not on file     Family History: The patient's family history includes Diabetes in her son; Stroke in her father. There is no history of Colon polyps, Colon cancer, Esophageal cancer, Stomach cancer, Pancreatic cancer, Kidney disease, or Liver disease. ROS:   Please see the history of present illness.    All 14 point review of systems negative except as described per history of present illness  EKGs/Labs/Other Studies Reviewed:      Recent Labs: 01/13/2018: ALT 16; BUN 9; Creat 0.84; Hemoglobin 15.3; Platelets 264; Potassium 3.9; Sodium  131; TSH 1.64  Recent Lipid Panel    Component Value Date/Time   CHOL 291 (H) 09/18/2010 2032   TRIG 308 (H) 09/18/2010 2032   HDL 38 (L) 09/18/2010 2032   CHOLHDL 7.7 Ratio 09/18/2010 2032   VLDL 62 (H) 09/18/2010 2032   LDLCALC 191 (H) 09/18/2010 2032    Physical Exam:    VS:  BP 120/70   Pulse 73  Ht 5\' 6"  (1.676 m)   Wt 181 lb 6.4 oz (82.3 kg)   SpO2 95%   BMI 29.28 kg/m     Wt Readings from Last 3 Encounters:  02/25/18 181 lb 6.4 oz (82.3 kg)  02/23/18 179 lb (81.2 kg)  01/13/18 180 lb (81.6 kg)     GEN:  Well nourished, well developed in no acute distress HEENT: Normal NECK: No JVD; No carotid bruits LYMPHATICS: No lymphadenopathy CARDIAC: RRR, folic ejection murmur 9-4/5 best heard at the right upper portion of the sternum, no rubs, no gallops RESPIRATORY:  Clear to auscultation without rales, wheezing or rhonchi  ABDOMEN: Soft, non-tender, non-distended MUSCULOSKELETAL:  No edema; No deformity  SKIN: Warm and dry LOWER EXTREMITIES: no swelling NEUROLOGIC:  Alert and oriented x 3 PSYCHIATRIC:  Normal affect   ASSESSMENT:    1. Abdominal aortic aneurysm (AAA) without rupture ([place])   2. Aortic valve disease   3. Paroxysmal atrial fibrillation (HCC)   4. Sinus bradycardia   5. S/P aortic valve replacement    PLAN:    In order of problems listed above:  1. Sinus bradycardia: Much improved.  EKG today showed normal sinus rhythm borderline first-degree AV block left ventricle hypertrophy with repolarization abnormalities.  We will continue present management with metoprolol 25 twice daily.  He is feeling much better when smaller dose of beta-blocker. 2. Aortic valve disease: Stable status post aortic valve replacement with bioprosthesis with minimal gradient. 3. Axis small atrial fibrillation: Anticoagulated which is very appropriate.  No recent recurrences of this arrhythmia.  Continue present management.  She will follow-up with her primary cardiologist as  scheduled.   Medication Adjustments/Labs and Tests Ordered: Current medicines are reviewed at length with the patient today.  Concerns regarding medicines are outlined above.  No orders of the defined types were placed in this encounter.  Medication changes: No orders of the defined types were placed in this encounter.   Signed, Park Liter, MD, [facility] 02/25/2018 3:11 PM    [place]

## 2018-03-01 ENCOUNTER — Telehealth: Payer: Self-pay | Admitting: Cardiovascular Disease

## 2018-03-01 MED ORDER — AMLODIPINE BESYLATE 5 MG PO TABS: 5.0000 mg | ORAL_TABLET | Freq: Every day | ORAL | 11 refills | Status: DC

## 2018-03-01 NOTE — Telephone Encounter (Signed)
New Message:     *STAT* If patient is at the pharmacy, call can be transferred to refill team.   1. Which medications need to be refilled? (please list name of each medication and dose if known) amLODipine (NORVASC) 5 MG tablet  2. Which pharmacy/location (including street and city if local pharmacy) is medication to be sent to? [id number] - [place], [place] - [address]  3. Do they need a 30 day or 90 day supply? [place]

## 2018-03-01 NOTE — Telephone Encounter (Signed)
Rx(s) sent to pharmacy electronically.  

## 2018-03-21 ENCOUNTER — Ambulatory Visit: Payer: Medicare HMO | Admitting: Thoracic Surgery (Cardiothoracic Vascular Surgery)

## 2018-03-21 ENCOUNTER — Other Ambulatory Visit: Payer: Medicare HMO

## 2018-03-28 ENCOUNTER — Telehealth: Payer: Self-pay | Admitting: Cardiology

## 2018-03-28 NOTE — Telephone Encounter (Signed)
New message:     STAT if HR is under 50 or over 120 (normal HR is 60-100 beats per minute)  1) What is your heart rate? 47  2) Do you have a log of your heart rate readings (document readings)? 42 yesterday  3) Do you have any other symptoms? No    Pt thinks her medication is the reason for her heart drop

## 2018-03-28 NOTE — Telephone Encounter (Signed)
Received call from patient complaining of low HR.  States last week she noticed her HR running in the 40s around 11 am and last for approximately 30 mins.   This has occurred 3 mornings since last week.  HR today 42-47, she took her AM dose of metoprolol.    Reports feeling fatigued and lightheaded.   Reports BP "good" this am, but is not in the same room as her readings.   She did decrease her metoprolol to 25mg  BID.  Wondering if this needs to be decreased further.  She states after 30 mins her HR increases back to 60s.     Advised I would route to Dr. Stanford Breed for review/recommendations.   She is aware and verbalized understanding.

## 2018-03-28 NOTE — Telephone Encounter (Signed)
DC metoprolol and follow HR and BP [name]

## 2018-03-29 NOTE — Telephone Encounter (Signed)
Spoke with pt, Aware of dr crenshaw's recommendations.  °

## 2018-04-08 ENCOUNTER — Ambulatory Visit (INDEPENDENT_AMBULATORY_CARE_PROVIDER_SITE_OTHER): Payer: Medicare HMO | Admitting: Family Medicine

## 2018-04-08 ENCOUNTER — Encounter: Payer: Self-pay | Admitting: Family Medicine

## 2018-04-08 ENCOUNTER — Telehealth: Payer: Self-pay | Admitting: Family Medicine

## 2018-04-08 VITALS — BP 153/64 | HR 84 | Ht 66.0 in | Wt 183.0 lb

## 2018-04-08 DIAGNOSIS — R87622 Low grade squamous intraepithelial lesion on cytologic smear of vagina (LGSIL): Secondary | ICD-10-CM

## 2018-04-08 DIAGNOSIS — R8290 Unspecified abnormal findings in urine: Secondary | ICD-10-CM | POA: Diagnosis not present

## 2018-04-08 DIAGNOSIS — R87612 Low grade squamous intraepithelial lesion on cytologic smear of cervix (LGSIL): Secondary | ICD-10-CM

## 2018-04-08 DIAGNOSIS — K6289 Other specified diseases of anus and rectum: Secondary | ICD-10-CM | POA: Diagnosis not present

## 2018-04-08 DIAGNOSIS — R102 Pelvic and perineal pain: Secondary | ICD-10-CM

## 2018-04-08 DIAGNOSIS — N760 Acute vaginitis: Secondary | ICD-10-CM | POA: Diagnosis not present

## 2018-04-08 LAB — WET PREP FOR TRICH, YEAST, CLUE
MICRO NUMBER: 90573157
Specimen Quality: ADEQUATE

## 2018-04-08 MED ORDER — NYSTATIN 100000 UNIT/GM EX CREA: 1.0000 "application " | TOPICAL_CREAM | Freq: Two times a day (BID) | CUTANEOUS | 0 refills | Status: DC

## 2018-04-08 NOTE — Progress Notes (Signed)
Subjective:    Patient ID: Laurie [name], female    DOB: Aug 22, 1935, 83 y.o.   MRN: [id number]  HPI 83 year old female comes in today complaining of pelvic pressure and pain that is been going on for a while but she started experiencing more vaginal odor and irritation around the rectal area this week.  She denies any change in frequency of urination or dysuria and no change in color but what she has noticed is what looks like on most bubbles in her urine that she describes as looking mucousy. Says at night she was feels like there is something pushing or pressing on her bladder that makes her feel like she needs to urinate more frequently.  She says she tries to drink water all day long but for whatever reason for frequently urinates at night instead of during the day.  Did have a Pap smear showing ASCUS as well.  Actually previously seen her for pelvic pain back in the fall.  We did an ultrasound which showed a normal-appearing uterus and they were unable to visualize the ovaries.  No there were no masses seen.  Then saw OB/GYN Dr. Mora Bellman who felt that some of her pain was more GI related versus OB/GYN related and did do a possible PE in December.  He did have a low-grade squamous lesion on pathology.  Review of Systems  BP (!) 153/64   Pulse 84   Ht 5\' 6"  (1.676 m)   Wt 183 lb (83 kg)   SpO2 95%   BMI 29.54 kg/m     Allergies  Allergen Reactions  . Hyoscyamine Other (See Comments)    Mental status changes.  (as of 02/13/16 patient denies any problems with this medication)  . Rofecoxib Anaphylaxis and Hives  . Statins Other (See Comments)    Muscle aches    Past Medical History:  Diagnosis Date  . AAA (abdominal aortic aneurysm) ([place])   . Anemia    iron deficiency  . Ascending aorta dilatation (HCC)   . Atrial fibrillation ([place])    post op  . CAD (coronary artery disease)   . Carcinoma of lung ([place]) 08/28/2008   T2N0 moderately diff. squamous cell CA  .  Cerebrovascular disease   . COPD (chronic obstructive pulmonary disease) ([place]) 2005   on CXR with R apical scaring  . Headache(784.0)    Hx: of migraines in the past  . Hypercholesterolemia    Hx: of  . Hypertension   . Incidental pulmonary nodule, > 29mm and < 18mm    LLL  . S/P aortic valve replacement 08/28/2008   [facility]  . S/P lobectomy of lung 08/28/2008   rt middle lobe  dr. Roxy Manns  . Shingles 11-09    Past Surgical History:  Procedure Laterality Date  . ABDOMINAL SURGERY    . AORTIC VALVE REPLACEMENT  08/28/2008   #53mm [facility] Ease pericardial tissue valve  . APPENDECTOMY    . CARDIAC CATHETERIZATION    . CATARACT EXTRACTION W/ INTRAOCULAR LENS  IMPLANT, BILATERAL Bilateral   . CHOLECYSTECTOMY N/A 12/13/2013   Procedure: LAPAROSCOPIC CHOLECYSTECTOMY;  Surgeon: Ralene Ok, MD;  Location: [place];  Service: General;  Laterality: N/A;  . COLONOSCOPY     \  . DILATION AND CURETTAGE OF UTERUS    . ERCP N/A 05/08/2015   Procedure: ENDOSCOPIC RETROGRADE CHOLANGIOPANCREATOGRAPHY (ERCP);  Surgeon: Inda Castle, MD;  Location: [place];  Service: Endoscopy;  Laterality: N/A;  . ERCP N/A 01/13/2016  Procedure: ENDOSCOPIC RETROGRADE CHOLANGIOPANCREATOGRAPHY (ERCP);  Surgeon: Doran Stabler, MD;  Location: [facility];  Service: Endoscopy;  Laterality: N/A;  . ERCP N/A 03/09/2016   Procedure: ENDOSCOPIC RETROGRADE CHOLANGIOPANCREATOGRAPHY (ERCP);  Surgeon: Ladene Artist, MD;  Location[facility];  Service: Endoscopy;  Laterality: N/A;  . RML removed Dr Ricard Dillon for lung cancer  08/28/2008   T2N0 squamous cell CA  . TONSILLECTOMY AND ADENOIDECTOMY    . u/s guided aspiration of R breast cyst   2009   at the breast clinic    Social History   Socioeconomic History  . Marital status: Single    Spouse name: Not on file  . Number of children: Not on file  . Years of education: Not on file  . Highest education level: Not on file  Occupational History  . Not on file   Social Needs  . Financial resource strain: Not on file  . Food insecurity:    Worry: Not on file    Inability: Not on file  . Transportation needs:    Medical: Not on file    Non-medical: Not on file  Tobacco Use  . Smoking status: Former Smoker    Years: 40.00  . Smokeless tobacco: Never Used  Substance and Sexual Activity  . Alcohol use: No    Alcohol/week: 0.0 oz  . Drug use: No  . Sexual activity: Not on file  Lifestyle  . Physical activity:    Days per week: Not on file    Minutes per session: Not on file  . Stress: Not on file  Relationships  . Social connections:    Talks on phone: Not on file    Gets together: Not on file    Attends religious service: Not on file    Active member of club or organization: Not on file    Attends meetings of clubs or organizations: Not on file    Relationship status: Not on file  . Intimate partner violence:    Fear of current or ex partner: Not on file    Emotionally abused: Not on file    Physically abused: Not on file    Forced sexual activity: Not on file  Other Topics Concern  . Not on file  Social History Narrative  . Not on file    Family History  Problem Relation Age of Onset  . Stroke Father   . Diabetes Son        brittle diabetes  . Colon polyps Neg Hx   . Colon cancer Neg Hx   . Esophageal cancer Neg Hx   . Stomach cancer Neg Hx   . Pancreatic cancer Neg Hx   . Kidney disease Neg Hx   . Liver disease Neg Hx     Outpatient Encounter Medications as of 04/08/2018  Medication Sig  . acetaminophen (TYLENOL) 500 MG tablet Take 500 mg by mouth every 6 (six) hours as needed (for pain).   Marland Kitchen ALPRAZolam (XANAX) 0.5 MG tablet TAKE 0.5-1 TABLETS (0.25-5 MG TOTAL) BY MOUTH 2 (TWO) TIMES DAILY AS NEEDED.  Marland Kitchen amiodarone (PACERONE) 200 MG tablet Take 1 tablet (200 mg total) by mouth daily.  Marland Kitchen amLODipine (NORVASC) 5 MG tablet Take 1 tablet (5 mg total) by mouth daily.  Marland Kitchen lisinopril (PRINIVIL,ZESTRIL) 40 MG tablet Take 1  tablet (40 mg total) by mouth daily.  . rivaroxaban (XARELTO) 20 MG TABS tablet Take 1 tablet (20 mg total) by mouth daily with supper.  . traMADol Veatrice Bourbon)  50 MG tablet TAKE 1 TABLET BY MOUTH 3 TIMES DAILY AS NEEDED FOR PAIN  . nystatin cream (MYCOSTATIN) Apply 1 application topically 2 (two) times daily. To perineal area   No facility-administered encounter medications on file as of 04/08/2018.          Objective:   Physical Exam  Constitutional: She is oriented to person, place, and time. She appears well-developed and well-nourished.  HENT:  Head: Normocephalic and atraumatic.  Eyes: Conjunctivae and EOM are normal.  Cardiovascular: Normal rate.  Pulmonary/Chest: Effort normal.  Genitourinary:  Genitourinary Comments: External genitalia examined.  I do not see any distinct distinct rash or swelling.  She did have a little bit of thin white discharge at the opening of the vagina which I did swab for wet prep.  Did not see any distinct rash around the rectal area as well and no bleeding or irritation.  Neurological: She is alert and oriented to person, place, and time.  Skin: Skin is dry. No pallor.  Psychiatric: She has a normal mood and affect. Her behavior is normal.  Vitals reviewed.       Assessment & Plan:  Vaginal odor-we will check a wet prep for possible BV.  Rectal irritation-we will treat with nystatin.  Though I did not see an abnormal looking rash.  Abnormal urine-we will do a urinalysis with microscopic review to look for excess protein as well as casts.  Pelvic pain/pressure-unclear etiology but she had similar symptoms back in the fall.  For now we will just rule out infection and will check the urine for abnormalities.  Grade squamous lesion on cervix-we will try to follow back up with OB/GYN to confirm when they wanted to see her back.

## 2018-04-08 NOTE — Telephone Encounter (Signed)
Patient had an abnormal cervical biopsy on colposcopy in December.  I did not see when she is to return to OB/GYN and just wanted to help her keep track of when she is to return.  Beatrice Lecher, MD

## 2018-04-08 NOTE — Telephone Encounter (Signed)
Left VM for Pt to return clinic call.  

## 2018-04-09 LAB — URINALYSIS, MICROSCOPIC ONLY
Bacteria, UA: NONE SEEN /HPF
RBC / HPF: NONE SEEN /HPF (ref 0–2)

## 2018-04-11 ENCOUNTER — Other Ambulatory Visit: Payer: Self-pay | Admitting: *Deleted

## 2018-04-11 DIAGNOSIS — R8290 Unspecified abnormal findings in urine: Secondary | ICD-10-CM

## 2018-04-18 ENCOUNTER — Ambulatory Visit: Payer: Medicare HMO | Admitting: Thoracic Surgery (Cardiothoracic Vascular Surgery)

## 2018-04-18 ENCOUNTER — Other Ambulatory Visit: Payer: Self-pay

## 2018-04-18 ENCOUNTER — Encounter: Payer: Self-pay | Admitting: Thoracic Surgery (Cardiothoracic Vascular Surgery)

## 2018-04-18 ENCOUNTER — Ambulatory Visit
Admission: RE | Admit: 2018-04-18 | Discharge: 2018-04-18 | Disposition: A | Discharge status: Home / Self Care | Payer: Medicare HMO | Source: Ambulatory Visit | Attending: Thoracic Surgery (Cardiothoracic Vascular Surgery) | Admitting: Thoracic Surgery (Cardiothoracic Vascular Surgery)

## 2018-04-18 VITALS — BP 144/78 | HR 78 | Resp 18 | Ht 66.0 in | Wt 183.0 lb

## 2018-04-18 DIAGNOSIS — Z902 Acquired absence of lung [part of]: Secondary | ICD-10-CM | POA: Diagnosis not present

## 2018-04-18 DIAGNOSIS — I712 Thoracic aortic aneurysm, without rupture, unspecified: Secondary | ICD-10-CM

## 2018-04-18 DIAGNOSIS — Z952 Presence of prosthetic heart valve: Secondary | ICD-10-CM | POA: Diagnosis not present

## 2018-04-18 DIAGNOSIS — D381 Neoplasm of uncertain behavior of trachea, bronchus and lung: Secondary | ICD-10-CM

## 2018-04-18 DIAGNOSIS — R911 Solitary pulmonary nodule: Secondary | ICD-10-CM

## 2018-04-18 MED ORDER — IOPAMIDOL (ISOVUE-300) INJECTION 61%
75.0000 mL | Freq: Once | INTRAVENOUS | Status: AC | PRN
  Administered 2018-04-18: 75 mL via INTRAVENOUS

## 2018-04-18 NOTE — Progress Notes (Signed)
[address]       [place] [address]             [phone]     CARDIOTHORACIC SURGERY OFFICE NOTE  Referring Provider is Stanford Breed, Denice Bors, MD PCP is Hali Marry, MD   HPI:  Patient is an 83 and nearly 83 year old female with multiple medical problems who returns to our office today for follow-up and surveillance of an incidental pulmonary nodule noted in the base of the left lung.  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 She has had stable mild aneurysmal enlargement of the ascending thoracic aorta and a small infrarenal aortic aneurysm.  She was last seen in our office on March 22, 2017.  Since then she has remained clinically stable.  Last follow-up echocardiogram performed March 12, 2016 revealed normal left ventricular systolic function with ejection fraction estimated 60 to 65%.  Her aortic valve prosthesis was functioning normally with peak velocity across the aortic valve reported 1.5 m/s corresponding to mean transvalvular gradient estimated only 6 mmHg.  There is no aortic insufficiency.  She continues to follow-up intermittently with Dr. Stanford Breed.  She complains that she has had problems scheduling appointments at [facility].  She previously had been on metoprolol but this has been stopped secondary to bradycardia.  She has chronic back pain.  She denies any chest pain either with activity at rest.  She states that she only gets short of breath with more strenuous exertion.  She otherwise denies any significant exertional shortness of breath   Current Outpatient Medications  Medication Sig Dispense Refill  . acetaminophen (TYLENOL) 500 MG tablet Take 500 mg by mouth every 6 (six) hours as needed (for pain).     Marland Kitchen ALPRAZolam  (XANAX) 0.5 MG tablet TAKE 0.5-1 TABLETS (0.25-5 MG TOTAL) BY MOUTH 2 (TWO) TIMES DAILY AS NEEDED. 60 tablet 1  . amiodarone (PACERONE) 200 MG tablet Take 1 tablet (200 mg total) by mouth daily. 97 tablet 3  . amLODipine (NORVASC) 5 MG tablet Take 1 tablet (5 mg total) by mouth daily. 30 tablet 11  . lisinopril (PRINIVIL,ZESTRIL) 40 MG tablet Take 1 tablet (40 mg total) by mouth daily. 90 tablet 3  . nystatin cream (MYCOSTATIN) Apply 1 application topically 2 (two) times daily. To perineal area 30 g 0  . rivaroxaban (XARELTO) 20 MG TABS tablet Take 1 tablet (20 mg total) by mouth daily with supper. 90 tablet 3  . traMADol (ULTRAM) 50 MG tablet TAKE 1 TABLET BY MOUTH 3 TIMES DAILY AS NEEDED FOR PAIN 90 tablet 2   No current facility-administered medications for this visit.       Physical Exam:   BP (!) 144/78 (BP Location: Left Arm, Patient Position: Sitting, Cuff Size: Large)   Pulse 78   Resp 18   Ht 5\' 6"  (1.676 m)   Wt 183 lb (83 kg)   SpO2 94% Comment: ON RA  BMI 29.54 kg/m   General:  Elderly but well-appearing  Chest:   Clear to auscultation  CV:   Regular rate and rhythm with soft systolic murmur heard best along the right sternal border  Incisions:  n/a  Abdomen:  Soft nontender  Extremities:  Warm and well-perfused  Diagnostic Tests:  CT CHEST WITH CONTRAST  TECHNIQUE: Multidetector CT imaging of the chest was performed during intravenous contrast administration.  CONTRAST:  54mL ISOVUE-300 IOPAMIDOL (ISOVUE-300) INJECTION 61%  COMPARISON:  03/22/2017  FINDINGS: Cardiovascular: Aortic and three-vessel coronary artery atherosclerosis. Unchanged aneurysmal dilatation of the ascending aorta to a maximal diameter 4.7 cm. Unchanged aneurysmal dilatation of the distal descending thoracic aorta to 4.0 cm maximal diameter. Status post aortic valve replacement. Normal heart size. No pericardial effusion.  Mediastinum/Nodes: No enlarged axillary, mediastinal, or  hilar lymph nodes. Unremarkable thyroid. Small sliding hiatal hernia.  Lungs/Pleura: There is no pleural effusion or pneumothorax. Sequelae of right middle lobectomy are again identified. Right lung scarring is unchanged. A 7 x 5 mm (mean 6 mm) left lower lobe nodule is unchanged from both the most recent prior examination as well as an older study from 08/28/2013. No new or suspicious nodules are identified. There is mild centrilobular emphysema.  Upper Abdomen: Mild chronic biliary dilatation and pneumobilia are again partially visualized. A 2.0 x 1.4 cm left adrenal nodule is unchanged and likely represents an adenoma. Fusiform aneurysmal dilatation of the infrarenal abdominal aorta is partially visualized and measures up to 3.7 cm in diameter, unchanged.  Musculoskeletal: No acute osseous abnormality or suspicious osseous lesion.  IMPRESSION: 1. Unchanged 6 mm left lower lobe lung nodule with stability over time compatible with benignity. 2. No new or suspicious lung nodule. Prior right middle lobectomy with right lung scarring. 3. Unchanged 4.7 cm ascending and 4.0 cm descending thoracic aortic aneurysms. 4. 3.7 cm infrarenal abdominal aortic aneurysm, similar to prior though incompletely imaged.  Aortic Atherosclerosis (ICD10-I70.0). Emphysema (ICD10-J43.9). Aortic aneurysm NOS (ICD10-I71.9).   Electronically Signed   By: Logan Bores M.D.   On: 04/18/2018 12:54   Impression:  I have personally reviewed the patient's follow-up CT scan performed earlier today.  Stable radiographic appearance of benign-appearing nodule in the basilar segments of the left lower lobe.  This nodule has been stable for the past 10 years and probably does not require any further long-term surveillance.  The patient also has stable mild aneurysmal enlargement of the ascending thoracic aorta and a small infrarenal aortic aneurysm.    Plan:  I have discussed results of the patient's  CT scan at length with the patient in the office today.  We both agree there is probably no reason to continue to check CT scans of the benign-appearing nodule in the left lung.  We also discussed whether or not to continue surveillance of her thoracic aorta or abdominal aorta.  The patient understands there is a small chance that these could slowly enlarged over time, but she feels that it would be unlikely that we would choose to intervene in any way given her age and slowly declining health status.  Under the circumstances I have offered to bring her back in 2 years for follow-up CT scan, but she would rather hold off and only return in the future should specific problems or questions arise.  She will continue to follow-up with Dr. Stanford Breed who may or may not choose to order any type of follow-up imaging in the long-term future depending upon her clinical condition.  The patient has been reminded regarding the importance of dental hygiene and the lifelong need for antibiotic prophylaxis for all dental cleanings and other related invasive procedures because of her bioprosthetic aortic valve.  All questions answered.  She will call and return to our office in the future only should specific problems or questions arise.   I spent  in excess of 15 minutes during the conduct of this office consultation and >50% of this time involved direct face-to-face encounter with the patient for counseling and/or coordination of their care.   Laurie [name]. Roxy Manns, MD 04/18/2018 1:13 PM

## 2018-04-18 NOTE — Patient Instructions (Signed)
Continue all previous medications without any changes at this time  

## 2018-04-21 NOTE — Telephone Encounter (Signed)
Spoke with Pt, she was advised by GYN that her cervical biopsy was normal.

## 2018-04-21 NOTE — Telephone Encounter (Signed)
Can we call her again or mail lette.

## 2018-04-22 NOTE — Progress Notes (Signed)
HPI: FU atrial fibrillation, CAD, ho aortic stenosis status post aortic valve replacement in September 2009. She had a pericardial tissue valve; also had a septal myectomy and a right middle lobectomy due to lung CA. Note, she had a 50% mid LAD by catheterization preoperatively. Echocardiogram April 2017 showed normal LV function, grade 2 diastolic dysfunction, prior aortic valve replacement with mean gradient 6 mmHg. Carotid Dopplers November 2017 showed mild bilateral atherosclerosis and no hemodynamically significant stenosis. Patient seen in the emergency room March 2018 with recurrent atrial fibrillation and underwent cardioversion. Dopplers 5/18: ABI of 0.57 on the right and 0.76 on the left. Waveforms were suggestive of inflow disease. Duplex showed significant distal aortic and bilateral common iliac artery stenosis with moderate left SFA stenosis.Seen by Dr Fletcher Anon and medical therapy recommended for now.Amiodarone added at previous ov for recurrent atrial fibrillation. Noted to be bradycardic at previous office visit and metoprolol decreased.  Note she lost her son recently.  Chest CT May 2019 showed 4.7 cm ascending in 4 cm descending thoracic aortic aneurysm.  3.7 cm infrarenal abdominal aortic aneurysm. Since I last saw her,  she denies chest pain, palpitations or syncope.  Mild dyspnea on exertion.  Current Outpatient Medications  Medication Sig Dispense Refill  . acetaminophen (TYLENOL) 500 MG tablet Take 500 mg by mouth every 6 (six) hours as needed (for pain).     Marland Kitchen ALPRAZolam (XANAX) 0.5 MG tablet TAKE 0.5-1 TABLETS (0.25-5 MG TOTAL) BY MOUTH 2 (TWO) TIMES DAILY AS NEEDED. 60 tablet 1  . amiodarone (PACERONE) 200 MG tablet Take 1 tablet (200 mg total) by mouth daily. 97 tablet 3  . amLODipine (NORVASC) 5 MG tablet Take 1 tablet (5 mg total) by mouth daily. 30 tablet 11  . lisinopril (PRINIVIL,ZESTRIL) 40 MG tablet Take 1 tablet (40 mg total) by mouth daily. 90 tablet 3  .  nystatin cream (MYCOSTATIN) Apply 1 application topically 2 (two) times daily. To perineal area 30 g 0  . rivaroxaban (XARELTO) 20 MG TABS tablet Take 1 tablet (20 mg total) by mouth daily with supper. 90 tablet 3  . traMADol (ULTRAM) 50 MG tablet TAKE 1 TABLET BY MOUTH 3 TIMES DAILY AS NEEDED FOR PAIN 90 tablet 2   No current facility-administered medications for this visit.      Past Medical History:  Diagnosis Date  . AAA (abdominal aortic aneurysm) ([place])   . Anemia    iron deficiency  . Ascending aorta dilatation ([facility])   . Atrial fibrillation ([place])    post op  . CAD (coronary artery disease)   . Carcinoma of lung ([place]) 08/28/2008   T2N0 moderately diff. squamous cell CA  . Cerebrovascular disease   . COPD (chronic obstructive pulmonary disease) ([place]) 2005   on CXR with R apical scaring  . Headache(784.0)    Hx: of migraines in the past  . Hypercholesterolemia    Hx: of  . Hypertension   . Incidental pulmonary nodule, > 59mm and < 56mm    LLL  . S/P aortic valve replacement 08/28/2008   [facility]  . S/P lobectomy of lung 08/28/2008   rt middle lobe  dr. Roxy Manns  . Shingles 11-09    Past Surgical History:  Procedure Laterality Date  . ABDOMINAL SURGERY    . AORTIC VALVE REPLACEMENT  08/28/2008   #44mm [facility] Ease pericardial tissue valve  . APPENDECTOMY    . CARDIAC CATHETERIZATION    . CATARACT EXTRACTION W/ INTRAOCULAR LENS  IMPLANT, BILATERAL Bilateral   . CHOLECYSTECTOMY N/A 12/13/2013   Procedure: LAPAROSCOPIC CHOLECYSTECTOMY;  Surgeon: Ralene Ok, MD;  Location: [place];  Service: General;  Laterality: N/A;  . COLONOSCOPY     \  . DILATION AND CURETTAGE OF UTERUS    . ERCP N/A 05/08/2015   Procedure: ENDOSCOPIC RETROGRADE CHOLANGIOPANCREATOGRAPHY (ERCP);  Surgeon: Inda Castle, MD;  Location: [place];  Service: Endoscopy;  Laterality: N/A;  . ERCP N/A 01/13/2016   Procedure: ENDOSCOPIC RETROGRADE CHOLANGIOPANCREATOGRAPHY (ERCP);  Surgeon: Doran Stabler, MD;  Location: [facility];  Service: Endoscopy;  Laterality: N/A;  . ERCP N/A 03/09/2016   Procedure: ENDOSCOPIC RETROGRADE CHOLANGIOPANCREATOGRAPHY (ERCP);  Surgeon: Ladene Artist, MD;  Location: [facility];  Service: Endoscopy;  Laterality: N/A;  . RML removed Dr Ricard Dillon for lung cancer  08/28/2008   T2N0 squamous cell CA  . TONSILLECTOMY AND ADENOIDECTOMY    . u/s guided aspiration of R breast cyst   2009   at the breast clinic    Social History   Socioeconomic History  . Marital status: Single    Spouse name: Not on file  . Number of children: Not on file  . Years of education: Not on file  . Highest education level: Not on file  Occupational History  . Not on file  Social Needs  . Financial resource strain: Not on file  . Food insecurity:    Worry: Not on file    Inability: Not on file  . Transportation needs:    Medical: Not on file    Non-medical: Not on file  Tobacco Use  . Smoking status: Former Smoker    Years: 40.00  . Smokeless tobacco: Never Used  Substance and Sexual Activity  . Alcohol use: No    Alcohol/week: 0.0 oz  . Drug use: No  . Sexual activity: Not on file  Lifestyle  . Physical activity:    Days per week: Not on file    Minutes per session: Not on file  . Stress: Not on file  Relationships  . Social connections:    Talks on phone: Not on file    Gets together: Not on file    Attends religious service: Not on file    Active member of club or organization: Not on file    Attends meetings of clubs or organizations: Not on file    Relationship status: Not on file  . Intimate partner violence:    Fear of current or ex partner: Not on file    Emotionally abused: Not on file    Physically abused: Not on file    Forced sexual activity: Not on file  Other Topics Concern  . Not on file  Social History Narrative  . Not on file    Family History  Problem Relation Age of Onset  . Stroke Father   . Diabetes Son        brittle diabetes   . Colon polyps Neg Hx   . Colon cancer Neg Hx   . Esophageal cancer Neg Hx   . Stomach cancer Neg Hx   . Pancreatic cancer Neg Hx   . Kidney disease Neg Hx   . Liver disease Neg Hx     ROS: no fevers or chills, productive cough, hemoptysis, dysphasia, odynophagia, melena, hematochezia, dysuria, hematuria, rash, seizure activity, orthopnea, PND, pedal edema, claudication. Remaining systems are negative.  Physical Exam: Well-developed well-nourished in no acute distress.  Skin is warm and dry.  HEENT is normal.  Neck is supple.  Chest mild expiratory wheeze Cardiovascular exam is regular rate and rhythm.  2/6 systolic murmur left sternal border Abdominal exam nontender or distended. No masses palpated. Extremities show no edema. neuro grossly intact   A/P  1 status post aortic valve replacement-continue SBE prophylaxis.  2 paroxysmal atrial fibrillation-patient remains in sinus rhythm on examination.  Continue amiodarone.  Continue Xarelto.  3 hypertension-blood pressure is controlled. Beta-blocker discontinued because of bradycardia.  Increase amlodipine to 10 mg daily and follow.  4 Hyperlipidemia-continue diet.  Intolerant to statins.  5 coronary artery disease-patient is not having chest pain.  Continue medical therapy.  She is not on aspirin given need for anticoagulation.  She is intolerant to statins.  6 peripheral vascular disease-medical therapy recommended by Dr. Fletcher Anon.  7 thoracic, abdominal and iliac aneurysms-I have discussed aneurysms with patient.  She states she would not pursue surgical intervention if indicated in the future as she is "too old".  We will therefore not pursue further imaging and she is in agreement.  [name], MD

## 2018-04-26 ENCOUNTER — Other Ambulatory Visit: Payer: Self-pay | Admitting: Family Medicine

## 2018-04-26 DIAGNOSIS — R8290 Unspecified abnormal findings in urine: Secondary | ICD-10-CM | POA: Diagnosis not present

## 2018-04-26 DIAGNOSIS — F411 Generalized anxiety disorder: Secondary | ICD-10-CM

## 2018-04-27 ENCOUNTER — Ambulatory Visit: Payer: Medicare HMO | Admitting: Cardiology

## 2018-04-27 ENCOUNTER — Encounter: Payer: Self-pay | Admitting: Cardiology

## 2018-04-27 VITALS — BP 160/86 | HR 76 | Ht 66.0 in | Wt 183.4 lb

## 2018-04-27 DIAGNOSIS — I10 Essential (primary) hypertension: Secondary | ICD-10-CM | POA: Diagnosis not present

## 2018-04-27 DIAGNOSIS — I480 Paroxysmal atrial fibrillation: Secondary | ICD-10-CM | POA: Diagnosis not present

## 2018-04-27 DIAGNOSIS — I359 Nonrheumatic aortic valve disorder, unspecified: Secondary | ICD-10-CM | POA: Diagnosis not present

## 2018-04-27 LAB — BASIC METABOLIC PANEL
BUN/Creatinine Ratio: 14 (calc) (ref 6–22)
BUN: 13 mg/dL (ref 7–25)
CHLORIDE: 97 mmol/L — AB (ref 98–110)
CO2: 26 mmol/L (ref 20–32)
Calcium: 9.3 mg/dL (ref 8.6–10.4)
Creat: 0.94 mg/dL — ABNORMAL HIGH (ref 0.60–0.88)
Glucose, Bld: 97 mg/dL (ref 65–99)
POTASSIUM: 4.2 mmol/L (ref 3.5–5.3)
SODIUM: 134 mmol/L — AB (ref 135–146)

## 2018-04-27 LAB — URINALYSIS, MICROSCOPIC ONLY: BACTERIA UA: NONE SEEN /HPF

## 2018-04-27 MED ORDER — AMLODIPINE BESYLATE 10 MG PO TABS: 10.0000 mg | ORAL_TABLET | Freq: Every day | ORAL | 3 refills | Status: DC

## 2018-04-27 NOTE — Patient Instructions (Signed)
Medication Instructions:   INCREASE AMLODIPINE TO 10 MG ONCE DAILY= 2 OF THE 5 MG TABLETS ONCE DAILY  Follow-Up:  Your physician wants you to follow-up in: [place] will receive a reminder letter in the mail two months in advance. If you don't receive a letter, please call our office to schedule the follow-up appointment.   If you need a refill on your cardiac medications before your next appointment, please call your pharmacy.

## 2018-05-10 ENCOUNTER — Telehealth: Payer: Self-pay | Admitting: Cardiology

## 2018-05-10 MED ORDER — AMLODIPINE BESYLATE 5 MG PO TABS: 5.0000 mg | ORAL_TABLET | Freq: Every day | ORAL | 3 refills | Status: DC

## 2018-05-10 NOTE — Telephone Encounter (Signed)
New Message:       Pt is calling in reference to her medicine but states it is personal.

## 2018-05-10 NOTE — Telephone Encounter (Signed)
Spoke with pt, she would like to decrease the amlodipine to 5 mg once daily. She will track her bp and pulse and let us know of problems.

## 2018-05-10 NOTE — Telephone Encounter (Signed)
Returned call to patient.She stated since amlodipine was increased to 10 mg daily and metoprolol was stopped she feels worse.Stated since the change B/P 133/71,109/68,91/60.Pulse [id number].Stated she has 5 more days of amlodipine 5 mg.She wanted to ask Dr.Crenshaw if she can go back to metoprolol 25 mg twice a day and amlodipine 5 mg daily.Advised Dr.Crenshaw is out of office.I will send message to his RN Hilda Blades.

## 2018-05-19 ENCOUNTER — Other Ambulatory Visit: Payer: Self-pay | Admitting: Family Medicine

## 2018-05-31 ENCOUNTER — Telehealth: Payer: Self-pay | Admitting: *Deleted

## 2018-05-31 NOTE — Telephone Encounter (Signed)
Spoke with pt, she is going to try B12, compression hose and elevating her legs before taking the furosemide . She will call back if symptoms do not improve.

## 2018-05-31 NOTE — Telephone Encounter (Signed)
Patient called this morning to report swelling in her feet. She reports her feet hurt because they are so puffy. She has SOB when walking longer distances and with doing laundry or strenuous chores in the home. She denies orthopnea. She deos not notice her heart racing. Will forward for dr Stanford Breed review

## 2018-05-31 NOTE — Telephone Encounter (Signed)
Lasix 20 mg pod as needed [name]

## 2018-06-07 ENCOUNTER — Other Ambulatory Visit: Payer: Self-pay | Admitting: Cardiology

## 2018-06-30 ENCOUNTER — Telehealth: Payer: Self-pay | Admitting: Cardiology

## 2018-06-30 DIAGNOSIS — R0602 Shortness of breath: Secondary | ICD-10-CM

## 2018-06-30 MED ORDER — FUROSEMIDE 20 MG PO TABS: 20.0000 mg | ORAL_TABLET | Freq: Every day | ORAL | 3 refills | Status: DC

## 2018-06-30 NOTE — Telephone Encounter (Signed)
New problem  Pt c/o swelling: STAT is pt has developed SOB within 24 hours  1) How much weight have you gained and in what time span? None  2) If swelling, where is the swelling located? Ankles and legs  3) Are you currently taking a fluid pill? No  4) Are you currently SOB? Sometimes  5) Do you have a log of your daily weights (if so, list)? No  6) Have you gained 3 pounds in a day or 5 pounds in a week? No  7) Have you traveled recently? No

## 2018-06-30 NOTE — Telephone Encounter (Signed)
Spoke with pt who reports she is still experiencing swelling in her feet and ankles. She states at night they really hurt and it to the point now that her shoes are leaving indentations. She reports SOB only with exertion or when walking long distance. Routing to Dr. Stanford Breed for recommendation.

## 2018-06-30 NOTE — Telephone Encounter (Signed)
Change lasix to 20 mg daily, bmet one week [name]

## 2018-06-30 NOTE — Telephone Encounter (Signed)
Spoke with pt, Aware of dr crenshaw's recommendations. New script sent to the pharmacy and Lab orders mailed to the pt  

## 2018-07-07 ENCOUNTER — Other Ambulatory Visit: Payer: Self-pay | Admitting: Family Medicine

## 2018-07-07 DIAGNOSIS — F411 Generalized anxiety disorder: Secondary | ICD-10-CM

## 2018-07-07 NOTE — Telephone Encounter (Signed)
Pt looked up in [employer]. Last fill was 06/02/18 #60 for 30 days.Will fwd to pcp for signature.Elouise Munroe, [place]

## 2018-07-14 DIAGNOSIS — R0602 Shortness of breath: Secondary | ICD-10-CM | POA: Diagnosis not present

## 2018-07-14 LAB — BASIC METABOLIC PANEL
BUN/Creatinine Ratio: 9 (calc) (ref 6–22)
BUN: 12 mg/dL (ref 7–25)
CALCIUM: 9.2 mg/dL (ref 8.6–10.4)
CO2: 29 mmol/L (ref 20–32)
Chloride: 96 mmol/L — ABNORMAL LOW (ref 98–110)
Creat: 1.28 mg/dL — ABNORMAL HIGH (ref 0.60–0.88)
Glucose, Bld: 103 mg/dL — ABNORMAL HIGH (ref 65–99)
Potassium: 4.1 mmol/L (ref 3.5–5.3)
Sodium: 133 mmol/L — ABNORMAL LOW (ref 135–146)

## 2018-07-28 DIAGNOSIS — Z79899 Other long term (current) drug therapy: Secondary | ICD-10-CM | POA: Diagnosis not present

## 2018-07-28 DIAGNOSIS — I480 Paroxysmal atrial fibrillation: Secondary | ICD-10-CM | POA: Diagnosis not present

## 2018-08-11 ENCOUNTER — Encounter

## 2018-08-11 ENCOUNTER — Encounter: Payer: Self-pay | Admitting: Gastroenterology

## 2018-08-11 ENCOUNTER — Ambulatory Visit: Payer: Medicare HMO | Admitting: Gastroenterology

## 2018-08-11 VITALS — BP 118/64 | HR 80 | Ht 66.0 in | Wt 180.5 lb

## 2018-08-11 DIAGNOSIS — R1031 Right lower quadrant pain: Secondary | ICD-10-CM

## 2018-08-11 DIAGNOSIS — R1011 Right upper quadrant pain: Secondary | ICD-10-CM

## 2018-08-11 DIAGNOSIS — K5909 Other constipation: Secondary | ICD-10-CM | POA: Diagnosis not present

## 2018-08-11 MED ORDER — POLYETHYLENE GLYCOL 3350 17 GM/SCOOP PO POWD: 17.0000 g | Freq: Every day | ORAL | 2 refills | Status: DC

## 2018-08-11 NOTE — Progress Notes (Signed)
[facility] Progress Note  Chief Complaint: Abdominal pain  Subjective  History:  Laurie [name] was last seen December 2017 for chronic right upper quadrant pain that is been going on since her cholecystectomy many years ago.  She has had at least 2 episodes of choledocholithiasis, one with cholangitis within the last few years, most recently April 2017.  She is here for ongoing right-sided abdominal pain.  She describes 2 separate areas, the right upper and right lower quadrant.  The right upper quadrant pain is been constant since her cholecystectomy years ago.  She has right lower quadrant pain that occurs most days, usually early in the morning, or worse if she lays on the right side or moves around much.  She tends toward constipation for many years.  She is quite ruminative about this abdominal pain, and says it radiates into her groin and sometimes right lower back.  Primary care did a CT scan abdomen July 2018 because of this abdominal pain, and Laurie [name] wonders if there may been diverticulitis on it.  She apparently then saw gynecology in follow-up.  ROS: Cardiovascular:  no chest pain Respiratory: no dyspnea Chronic right hip pain for which she takes tramadol or Tylenol. Remainder of systems negative except as above The patient's Past Medical, Family and Social History were reviewed and are on file in the EMR.  She had an aortic pericardial tissue valve replacement in September 2009, right middle lobectomy due to lung cancer, echocardiogram April 2017 with normal LV function, grade 2 diastolic dysfunction, paroxysmal atrial fibrillation on anticoagulation Medically managed coronary artery disease COPD Objective:  Med list reviewed  Current Outpatient Medications:  .  acetaminophen (TYLENOL) 500 MG tablet, Take 500 mg by mouth every 6 (six) hours as needed (for pain). , Disp: , Rfl:  .  ALPRAZolam (XANAX) 0.5 MG tablet, TAKE 0.5-1 TABLETS (0.25-5 MG TOTAL) BY MOUTH 2 (TWO)  TIMES DAILY AS NEEDED., Disp: 60 tablet, Rfl: 1 .  amiodarone (PACERONE) 200 MG tablet, TAKE 1 TABLET BY MOUTH EVERY DAY, Disp: 90 tablet, Rfl: 1 .  amLODipine (NORVASC) 5 MG tablet, Take 1 tablet (5 mg total) by mouth daily., Disp: 90 tablet, Rfl: 3 .  Docusate Sodium (STOOL SOFTENER LAXATIVE PO), Take by mouth as needed., Disp: , Rfl:  .  furosemide (LASIX) 20 MG tablet, Take 1 tablet (20 mg total) by mouth daily., Disp: 90 tablet, Rfl: 3 .  lisinopril (PRINIVIL,ZESTRIL) 40 MG tablet, Take 1 tablet (40 mg total) by mouth daily., Disp: 90 tablet, Rfl: 3 .  rivaroxaban (XARELTO) 20 MG TABS tablet, Take 1 tablet (20 mg total) by mouth daily with supper., Disp: 90 tablet, Rfl: 3 .  traMADol (ULTRAM) 50 MG tablet, TAKE 1 TABLET BY MOUTH THREE TIMES A DAY AS NEEDED FOR PAIN, Disp: 90 tablet, Rfl: 2 .  polyethylene glycol powder (GLYCOLAX/MIRALAX) powder, Take 17 g by mouth daily., Disp: 250 g, Rfl: 2   Vital signs in last 24 hrs: Vitals:   08/11/18 1322  BP: 118/64  Pulse: 80    Physical Exam  Well-appearing elderly woman.,  Good muscle mass  HEENT: sclera anicteric, oral mucosa moist without lesions  Neck: supple, no thyromegaly, JVD or lymphadenopathy  Cardiac: RRR without murmurs, S1S2 heard, no peripheral edema  Pulm: clear to auscultation bilaterally, normal RR and effort noted  Abdomen: soft, RUQ and RLQ tenderness (which is worse with leg raise, also tenderness right groin), with active bowel sounds. No guarding or palpable hepatosplenomegaly.  Skin; warm and dry, no jaundice or rash  Recent Labs:  Last LFTs on file from February 2019 were normal  Radiologic studies:  CTAP 05/2017  COMPARISON:  09/19/2016   FINDINGS: Lower chest: Emphysematous changes and fibrosis in the lung bases. Small esophageal hiatal hernia.   Hepatobiliary: Gallbladder is not identified and may be surgically absent or contracted. Bile ducts are mildly dilated with pneumobilia present. This  is likely related to previous surgery. Appearances are similar to previous study. No focal liver lesions identified.   Pancreas: Mild dilatation of the pancreatic duct in the head and body region. Appearance is similar to previous study and may indicate previous pancreatitis. No obstructing lesions are identified. No inflammatory changes.   Spleen: Normal in size without focal abnormality.   Adrenals/Urinary Tract: Left adrenal gland nodule measuring 1.9 cm diameter, similar to prior study. Kidneys are symmetrical. No solid mass or hydronephrosis. Bladder wall is not thickened and no filling defects are demonstrated.   Stomach/Bowel: Stomach, small bowel, and colon are not abnormally distended. Colon is diffusely stool-filled. No colonic wall thickening. Appendix is surgically absent.   Vascular/Lymphatic: Diffuse aortic calcification. Focal dilatation of the abdominal aorta with maximal AP diameter 3.3 cm. No significant change. A ectatic iliac arteries. No significant lymphadenopathy.   Reproductive: Uterus and bilateral adnexa are unremarkable.   Other: No abdominal wall hernia or abnormality. No abdominopelvic ascites.   Musculoskeletal: Degenerative changes in the spine. No destructive bone lesions.   IMPRESSION: 1. No acute process demonstrated in the abdomen or pelvis. 2. Emphysematous changes and fibrosis in the lung bases. Small esophageal hiatal hernia. 3. Pneumobilia with mild bile duct dilatation, likely postoperative. 4. Mild dilatation of the pancreatic duct in the head and body, likely postinflammatory. No change since prior study. 5. 1.9 cm left adrenal gland nodule without change. 6. Aortic atherosclerosis with focal ectatic abdominal aorta.     Electronically Signed   By: Lucienne Capers M.D.   On: 06/25/2017 00:14      @ASSESSMENTPLANBEGIN @ Assessment: Encounter Diagnoses  Name Primary?  . RLQ abdominal pain Yes  . RUQ pain   . Chronic  constipation    Laurie [name] has long-standing right upper quadrant pain since cholecystectomy.  She has had a couple occasions of choledocholithiasis, once with cholangitis.  When that occurs, she gets elevated LFTs, jaundice and fever.  I do not think it is likely she has choledocholithiasis now.  I certainly would not pursue ERCP with a low clinical suspicion.  She has multiple chronic pain complaints, including centered around the right hip.  I wonder if she may have pain radiated into the groin and possibly abdominal wall.  It is not clear to me she has a lower digestive cause for this pain.  Constipation could be contributing, and suspect that is the only thing I might be able to treat at this point.   Plan: GlycoLax powder 1 capful daily Follow-up with primary care regarding chronic pain management Present to hospital if she has escalating right upper quadrant pain associated with fever or jaundice. See me as needed  Total time 25 minutes, over half spent face-to-face with patient in counseling and coordination of care.   [name]

## 2018-08-11 NOTE — Patient Instructions (Addendum)
If you are age 81 or older, your body mass index should be between 23-30. Your Body mass index is 29.13 kg/m. If this is out of the aforementioned range listed, please consider follow up with your Primary Care Provider.  If you are age 62 or younger, your body mass index should be between 19-25. Your Body mass index is 29.13 kg/m. If this is out of the aformentioned range listed, please consider follow up with your Primary Care Provider.   A Miralax Rx was sent to the pharmacy.   It was a pleasure to see you today!  Dr. Loletha Carrow

## 2018-08-24 ENCOUNTER — Encounter: Payer: Self-pay | Admitting: Family Medicine

## 2018-08-24 ENCOUNTER — Ambulatory Visit (INDEPENDENT_AMBULATORY_CARE_PROVIDER_SITE_OTHER): Payer: Medicare HMO

## 2018-08-24 ENCOUNTER — Ambulatory Visit (INDEPENDENT_AMBULATORY_CARE_PROVIDER_SITE_OTHER): Payer: Medicare HMO | Admitting: Family Medicine

## 2018-08-24 VITALS — BP 121/62 | HR 75 | Ht 66.0 in | Wt 180.0 lb

## 2018-08-24 DIAGNOSIS — M5431 Sciatica, right side: Secondary | ICD-10-CM

## 2018-08-24 DIAGNOSIS — M79671 Pain in right foot: Secondary | ICD-10-CM

## 2018-08-24 DIAGNOSIS — I700 Atherosclerosis of aorta: Secondary | ICD-10-CM

## 2018-08-24 DIAGNOSIS — D509 Iron deficiency anemia, unspecified: Secondary | ICD-10-CM | POA: Diagnosis not present

## 2018-08-24 DIAGNOSIS — M79662 Pain in left lower leg: Secondary | ICD-10-CM

## 2018-08-24 DIAGNOSIS — M79661 Pain in right lower leg: Secondary | ICD-10-CM

## 2018-08-24 DIAGNOSIS — R1011 Right upper quadrant pain: Secondary | ICD-10-CM | POA: Diagnosis not present

## 2018-08-24 DIAGNOSIS — M79672 Pain in left foot: Secondary | ICD-10-CM | POA: Diagnosis not present

## 2018-08-24 DIAGNOSIS — M5136 Other intervertebral disc degeneration, lumbar region: Secondary | ICD-10-CM | POA: Diagnosis not present

## 2018-08-24 DIAGNOSIS — M47816 Spondylosis without myelopathy or radiculopathy, lumbar region: Secondary | ICD-10-CM | POA: Diagnosis not present

## 2018-08-24 NOTE — Progress Notes (Signed)
Subjective:    Patient ID: Laurie [name], female    DOB: 1935/11/06, 83 y.o.   MRN: [id number]  HPI 83 year old female comes in today with a couple of musculoskeletal complaints.  She says that for several months now she is been having some pain in her calves.  She originally started Xarelto she was actually having a cold sensation in both calves but now they actually feel hot.  Is not really a burning sensation and more of a hot feeling and somewhat achy.  She has not been getting any cramps in the muscles.  She also reports that she is been having pain in the arch of both feet going predominantly along the entire length of the first and second metatarsal as well as some pain across the top of the foot bilaterally.  Also going on for several months.  She says it is painful enough that is actually waking her up in the middle the night.  She is not currently taking any medication has not tried heat or ice or soaking her feet.  She says it is painful to walk on them and even changing shoewear has not really made a big difference.  She denies any cramping in the arch of her feet she does have a history of peripheral arterial disease of her lower extremities that was diagnosed about 2 years ago.  He also complains of right lateral hip pain.  That she is pointing just a little bit posterior to the trochanteric bursa and says it radiates down her buttock area into the upper outer thigh.  She says sometimes she will rub her outer thigh and rub that area and actually feels better.  To bother her more at night when she tries to lay on her side.   Review of Systems  BP 121/62   Pulse 75   Ht 5\' 6"  (1.676 m)   Wt 180 lb (81.6 kg)   SpO2 96%   BMI 29.05 kg/m     Allergies  Allergen Reactions  . Hyoscyamine Other (See Comments)    Mental status changes.  (as of 02/13/16 patient denies any problems with this medication)  . Rofecoxib Anaphylaxis and Hives  . Statins Other (See Comments)    Muscle  aches    Past Medical History:  Diagnosis Date  . AAA (abdominal aortic aneurysm) ([place])   . Anemia    iron deficiency  . Ascending aorta dilatation ([facility])   . Atrial fibrillation ([place])    post op  . CAD (coronary artery disease)   . Carcinoma of lung ([place]) 08/28/2008   T2N0 moderately diff. squamous cell CA  . Cerebrovascular disease   . COPD (chronic obstructive pulmonary disease) ([facility]) 2005   on CXR with R apical scaring  . Headache(784.0)    Hx: of migraines in the past  . Hypercholesterolemia    Hx: of  . Hypertension   . Incidental pulmonary nodule, > 77mm and < 75mm    LLL  . S/P aortic valve replacement 08/28/2008   [facility]  . S/P lobectomy of lung 08/28/2008   rt middle lobe  dr. Roxy Manns  . Shingles 11-09    Past Surgical History:  Procedure Laterality Date  . ABDOMINAL SURGERY    . AORTIC VALVE REPLACEMENT  08/28/2008   #8mm [facility] Ease pericardial tissue valve  . APPENDECTOMY    . CARDIAC CATHETERIZATION    . CATARACT EXTRACTION W/ INTRAOCULAR LENS  IMPLANT, BILATERAL Bilateral   . CHOLECYSTECTOMY N/A  12/13/2013   Procedure: LAPAROSCOPIC CHOLECYSTECTOMY;  Surgeon: Ralene Ok, MD;  Location: [facility];  Service: General;  Laterality: N/A;  . COLONOSCOPY     \  . DILATION AND CURETTAGE OF UTERUS    . ERCP N/A 05/08/2015   Procedure: ENDOSCOPIC RETROGRADE CHOLANGIOPANCREATOGRAPHY (ERCP);  Surgeon: Inda Castle, MD;  Location: [facility];  Service: Endoscopy;  Laterality: N/A;  . ERCP N/A 01/13/2016   Procedure: ENDOSCOPIC RETROGRADE CHOLANGIOPANCREATOGRAPHY (ERCP);  Surgeon: Doran Stabler, MD;  Location: [facility];  Service: Endoscopy;  Laterality: N/A;  . ERCP N/A 03/09/2016   Procedure: ENDOSCOPIC RETROGRADE CHOLANGIOPANCREATOGRAPHY (ERCP);  Surgeon: Ladene Artist, MD;  Location: [facility];  Service: Endoscopy;  Laterality: N/A;  . RML removed Dr Ricard Dillon for lung cancer  08/28/2008   T2N0 squamous cell CA  . TONSILLECTOMY AND ADENOIDECTOMY    .  u/s guided aspiration of R breast cyst   2009   at the breast clinic    Social History   Socioeconomic History  . Marital status: Single    Spouse name: Not on file  . Number of children: Not on file  . Years of education: Not on file  . Highest education level: Not on file  Occupational History  . Not on file  Social Needs  . Financial resource strain: Not on file  . Food insecurity:    Worry: Not on file    Inability: Not on file  . Transportation needs:    Medical: Not on file    Non-medical: Not on file  Tobacco Use  . Smoking status: Former Smoker    Years: 40.00  . Smokeless tobacco: Never Used  Substance and Sexual Activity  . Alcohol use: No    Alcohol/week: 0.0 standard drinks  . Drug use: No  . Sexual activity: Not on file  Lifestyle  . Physical activity:    Days per week: Not on file    Minutes per session: Not on file  . Stress: Not on file  Relationships  . Social connections:    Talks on phone: Not on file    Gets together: Not on file    Attends religious service: Not on file    Active member of club or organization: Not on file    Attends meetings of clubs or organizations: Not on file    Relationship status: Not on file  . Intimate partner violence:    Fear of current or ex partner: Not on file    Emotionally abused: Not on file    Physically abused: Not on file    Forced sexual activity: Not on file  Other Topics Concern  . Not on file  Social History Narrative  . Not on file    Family History  Problem Relation Age of Onset  . Stroke Father   . Diabetes Son        brittle diabetes  . Colon polyps Neg Hx   . Colon cancer Neg Hx   . Esophageal cancer Neg Hx   . Stomach cancer Neg Hx   . Pancreatic cancer Neg Hx   . Kidney disease Neg Hx   . Liver disease Neg Hx     Outpatient Encounter Medications as of 08/24/2018  Medication Sig  . acetaminophen (TYLENOL) 500 MG tablet Take 500 mg by mouth every 6 (six) hours as needed (for pain).    Marland Kitchen ALPRAZolam (XANAX) 0.5 MG tablet TAKE 0.5-1 TABLETS (0.25-5 MG TOTAL) BY MOUTH 2 (TWO) TIMES  DAILY AS NEEDED.  Marland Kitchen amiodarone (PACERONE) 200 MG tablet TAKE 1 TABLET BY MOUTH EVERY DAY  . amLODipine (NORVASC) 5 MG tablet Take 1 tablet (5 mg total) by mouth daily.  Mariane Baumgarten Sodium (STOOL SOFTENER LAXATIVE PO) Take by mouth as needed.  . furosemide (LASIX) 20 MG tablet Take 1 tablet (20 mg total) by mouth daily.  Marland Kitchen lisinopril (PRINIVIL,ZESTRIL) 40 MG tablet Take 1 tablet (40 mg total) by mouth daily.  . polyethylene glycol powder (GLYCOLAX/MIRALAX) powder Take 17 g by mouth daily.  . rivaroxaban (XARELTO) 20 MG TABS tablet Take 1 tablet (20 mg total) by mouth daily with supper.  . traMADol (ULTRAM) 50 MG tablet TAKE 1 TABLET BY MOUTH THREE TIMES A DAY AS NEEDED FOR PAIN   No facility-administered encounter medications on file as of 08/24/2018.          Objective:   Physical Exam  Constitutional: She is oriented to person, place, and time. She appears well-developed and well-nourished.  HENT:  Head: Normocephalic and atraumatic.  Eyes: Conjunctivae and EOM are normal.  Cardiovascular: Normal rate.  Pulmonary/Chest: Effort normal.  Musculoskeletal:  Both ankles and feet with normal range of motion.  No significant swelling erythema or rash etc.  She is just a little bit tender over the first metatarsals bilaterally.  Nontender over the distal heads of the metatarsals.  Drink is 5 out of 5 in both directions.  On her right hip nontender over the trochanteric bursa.  Just a little bit tender over the right SI joint.  Hip and knee flexion and extension are normal.  Neurological: She is alert and oriented to person, place, and time.  Skin: Skin is dry. No pallor.  Psychiatric: She has a normal mood and affect. Her behavior is normal.  Vitals reviewed.      Assessment & Plan:  Bilateral foot pain -unclear etiology.  Her pain is actually quite symmetric which is interesting is mostly over  the arch and the distal foot but not over the distal metatarsal heads.  Do not see any swelling or trauma or injury or bruising on exam.  She does have fairly high arches.  It looks like she is wearing fairly good supportive shoe wear today.  I like to start with x-rays just to rule out any type of stress fracture since she is having pain with walking has been persistent for months.  Also consider neuropathy.  Though she is not having any burning, numbness or tingling it really is just more of a deep ache.  Consider checking for B12 deficiency iron deficiency etc.  Lateral calf pain-again more of an aching and a hot feeling but not really a burning sensation or numbness or tingling.  Again we will check for B12 deficiency anemia etc.  It is a little bit unusual.  No significant swelling on exam.  Right sciatic pain-I do think this is probably coming from her back and not so much the hip joint.  She is really not having any pain over the groin crease or the trochanteric bursa.  Recommend x-rays of her lumbar spine we have gotten hip right x-rays in the past but not of her back.

## 2018-08-25 LAB — CBC
HCT: 42.3 % (ref 35.0–45.0)
HEMOGLOBIN: 14.6 g/dL (ref 11.7–15.5)
MCH: 29.7 pg (ref 27.0–33.0)
MCHC: 34.5 g/dL (ref 32.0–36.0)
MCV: 86.2 fL (ref 80.0–100.0)
MPV: 10.9 fL (ref 7.5–12.5)
Platelets: 284 10*3/uL (ref 140–400)
RBC: 4.91 10*6/uL (ref 3.80–5.10)
RDW: 12 % (ref 11.0–15.0)
WBC: 6.7 10*3/uL (ref 3.8–10.8)

## 2018-08-25 LAB — ADVANCED WRITTEN NOTIFICATION (AWN) TEST REFUSAL: AWN TEST REFUSED: 457899927

## 2018-08-25 LAB — MAGNESIUM: MAGNESIUM: 2.1 mg/dL (ref 1.5–2.5)

## 2018-09-15 ENCOUNTER — Other Ambulatory Visit: Payer: Self-pay | Admitting: Family Medicine

## 2018-09-19 ENCOUNTER — Other Ambulatory Visit: Payer: Self-pay | Admitting: Family Medicine

## 2018-09-19 DIAGNOSIS — F411 Generalized anxiety disorder: Secondary | ICD-10-CM

## 2018-09-26 ENCOUNTER — Encounter: Payer: Self-pay | Admitting: Family Medicine

## 2018-09-26 ENCOUNTER — Ambulatory Visit (INDEPENDENT_AMBULATORY_CARE_PROVIDER_SITE_OTHER): Payer: Medicare HMO | Admitting: Family Medicine

## 2018-09-26 VITALS — BP 117/56 | HR 76 | Wt 178.0 lb

## 2018-09-26 DIAGNOSIS — M542 Cervicalgia: Secondary | ICD-10-CM

## 2018-09-26 DIAGNOSIS — M76891 Other specified enthesopathies of right lower limb, excluding foot: Secondary | ICD-10-CM | POA: Diagnosis not present

## 2018-09-26 DIAGNOSIS — M76819 Anterior tibial syndrome, unspecified leg: Secondary | ICD-10-CM | POA: Diagnosis not present

## 2018-09-26 MED ORDER — DICLOFENAC SODIUM 1 % TD GEL: 2.0000 g | Freq: Four times a day (QID) | TRANSDERMAL | 11 refills | Status: DC

## 2018-09-26 NOTE — Patient Instructions (Signed)
Thank you for coming in today. Attend PT.  Use the scaphoid pads in your shoes.  You can buy more from [email or web address] or [employer].  Size Medium.    Do the foot going up exercises with the band slowly.   Use the diclofenac gel for pain as needed.   Recheck with me in 1 month or sooner if needed.   For the scalp we will keep an eye on it.

## 2018-09-26 NOTE — Progress Notes (Signed)
Laurie [name] is a 83 y.o. female who presents to [facility] today for neck soreness, bilateral foot pain, and right hip pain. Her newest concern today is neck "soreness" that began two weeks ago and has worsened over the past week. This bothers her with head movement. Last night, she woke up scratching the back of her neck and sweating. Her daughter-in-law noticed that the back of her neck has been pink-to-red. She has been taking Tylenol and occasional tramadol for pain. She denies vision changes and headaches.   Laurie [name] was seen by Dr. Madilyn Fireman ~1 month ago for bilateral foot pain and right hip pain. Bilateral feet x-rays did not reveal any fractures or acute abnormalities. X-ray of lumbar spine showed degenerative changes in lumbar spine. Today, Laurie [name] notes that her pain has not improved. She has foot pain, no matter what shoes she wears, and the pain is so severe at night, that she is unable to move in bed.   Foot pain is located at the medial midfoot near navicular or proximal first metatarsal.  Pain is worse with activity and somewhat better with rest.  Hip pain is located at the posterior lateral right hip worse with activity somewhat better with rest.  As noted above she does have pain at rest at bedtime as well.  She denies any injury.  She has had some work-up with x-rays of her L-spine, and feet which did not show any acute fractures but did show some degenerative changes.   ROS:  As above  Exam:  BP (!) 117/56   Pulse 76   Wt 178 lb (80.7 kg)   BMI 28.73 kg/m  General: Well Developed, well nourished, and in no acute distress.  Neuro/Psych: Alert and oriented x3, extra-ocular muscles intact, able to move all 4 extremities, sensation grossly intact. Skin: Warm and dry, no rashes noted.  Respiratory: Not using accessory muscles, speaking in full sentences, trachea midline.  Cardiovascular: Pulses palpable, no  extremity edema. Abdomen: Does not appear distended. MSK:  Cervical spine:  Pink-to-red blotchy appearance on back of neck and past her hair line.  No tenderness to palpation of the cervical spine.  Limited neck extension. Limited neck bending side to side.  Triceps, biceps, triceps, interosseous strength intact. Biceps and brachioradialis reflexes 2+.  Right hip: Point tenderness to palpation over right ischial tuberosity.  Normal ROM.  Hip abduction strength 4/5.  Right foot: Cavus appearing foot with mild prominent navicular at the medial aspect of the foot.  Otherwise normal-appearing Normal foot and ankle motion. Tender to palpation at insertion of the tibialis anterior tendon. With standing foot pronates medially.  Left foot: Again slight cavus appearing foot with mildly prominent navicular. Normal foot and ankle motion. Tender palpation insertion tibiotalar tendon. Pronation with foot standing.  Pain is present with resisted foot dorsiflexion bilaterally.  Lab and Radiology Results CLINICAL DATA:  Bilateral foot pain.  EXAM: RIGHT FOOT COMPLETE - 3+ VIEW  COMPARISON:  Left foot 08/24/2018  FINDINGS: Mild hallux valgus deformity. Negative for fracture or dislocation. No focal soft tissue abnormality. Minimal spurring in the calcaneus.  IMPRESSION: No acute abnormality to the right foot.  Mild hallux valgus deformity.   Electronically Signed   By: Markus Daft M.D.   On: 08/24/2018 16:29   CLINICAL DATA:  Bilateral foot pain.  EXAM: LEFT FOOT - COMPLETE 3+ VIEW  COMPARISON:  Right foot 08/24/2018  FINDINGS: Negative for fracture or dislocation. Alignment of  foot is within normal limits. Minimal spurring along the plantar aspect of the calcaneus. No focal soft tissue abnormality. No significant arthropathy.  IMPRESSION: No acute abnormality.   Electronically Signed   By: Markus Daft M.D.   On: 08/24/2018 16:32     CLINICAL DATA:   Right-sided sciatica, low back pain  EXAM: LUMBAR SPINE - COMPLETE 4+ VIEW  COMPARISON:  None.  FINDINGS: There are 5 nonrib bearing lumbar-type vertebral bodies.  The vertebral body heights are maintained. There is generalized osteopenia.  The alignment is anatomic. There is no static listhesis. There is no spondylolysis.  There is no acute fracture.  There is degenerative disease with disc height loss at L1-2, L2-3, L4-5 and L5-S1. There is bilateral facet arthropathy throughout the lumbar spine most severe at L5-S1.  There is mild osteoarthritis of bilateral sacroiliac joints.  There is a sclerotic bone lesion in the right ilium likely reflecting a bone island.  There is abdominal aortic atherosclerosis. There is an abdominal aortic aneurysm measuring approximately 3.3 cm in greatest AP diameter.  IMPRESSION: 1. Diffuse lumbar spine spondylosis as described above. 2. 3.3 cm Abdominal Aortic Aneurysm (ICD10-I71.9). Recommend follow-up aortic ultrasound in 3 years. This recommendation follows ACR consensus guidelines: White Paper of the ACR Incidental Findings Committee II on Vascular Findings. J Am Coll Radiol 2013; 10:789-794.   Electronically Signed   By: Kathreen Devoid   On: 08/24/2018 16:31 I personally (independently) visualized and performed the interpretation of the images attached in this note.   Assessment and Plan: 83 y.o. female with  Cervical spine spasm: Discussed with pt that this is most likely due to degenerative disease.  Mild erythema at the posterior neck likely unrelated.  Plan for physical therapy and heating pad.  Recheck in 1 month return sooner if needed.  Tibial anterior tendonitis: Most likely secondary to high arches and pronation of foot. Patient had improved symptoms with scaphoid pads.  Plan to continue and use scaphoid pads in her shoes. Will start PT and band exercises. Diclofenac gel as needed for pain. Follow-up in one  month.   Right hip rotator tendonitis: Will start PT and follow-up in one month.     Orders Placed This Encounter  Procedures  . Ambulatory referral to Physical Therapy    Referral Priority:   Routine    Referral Type:   Physical Medicine    Referral Reason:   Specialty Services Required    Requested Specialty:   Physical Therapy   Meds ordered this encounter  Medications  . diclofenac sodium (VOLTAREN) 1 % GEL    Sig: Apply 2 g topically 4 (four) times daily. To affected joint.    Dispense:  100 g    Refill:  11    Historical information moved to improve visibility of documentation.  Past Medical History:  Diagnosis Date  . AAA (abdominal aortic aneurysm) ([facility])   . Anemia    iron deficiency  . Ascending aorta dilatation ([facility])   . Atrial fibrillation ([facility])    post op  . CAD (coronary artery disease)   . Carcinoma of lung ([facility]) 08/28/2008   T2N0 moderately diff. squamous cell CA  . Cerebrovascular disease   . COPD (chronic obstructive pulmonary disease) ([facility]) 2005   on CXR with R apical scaring  . Headache(784.0)    Hx: of migraines in the past  . Hypercholesterolemia    Hx: of  . Hypertension   . Incidental pulmonary nodule, > 17mm and < 65mm  LLL  . S/P aortic valve replacement 08/28/2008   [facility]  . S/P lobectomy of lung 08/28/2008   rt middle lobe  dr. Roxy Manns  . Shingles 11-09   Past Surgical History:  Procedure Laterality Date  . ABDOMINAL SURGERY    . AORTIC VALVE REPLACEMENT  08/28/2008   #65mm [facility] Ease pericardial tissue valve  . APPENDECTOMY    . CARDIAC CATHETERIZATION    . CATARACT EXTRACTION W/ INTRAOCULAR LENS  IMPLANT, BILATERAL Bilateral   . CHOLECYSTECTOMY N/A 12/13/2013   Procedure: LAPAROSCOPIC CHOLECYSTECTOMY;  Surgeon: Ralene Ok, MD;  Location: [facility];  Service: General;  Laterality: N/A;  . COLONOSCOPY     \  . DILATION AND CURETTAGE OF UTERUS    . ERCP N/A 05/08/2015   Procedure: ENDOSCOPIC RETROGRADE  CHOLANGIOPANCREATOGRAPHY (ERCP);  Surgeon: Inda Castle, MD;  Location: [facility];  Service: Endoscopy;  Laterality: N/A;  . ERCP N/A 01/13/2016   Procedure: ENDOSCOPIC RETROGRADE CHOLANGIOPANCREATOGRAPHY (ERCP);  Surgeon: Doran Stabler, MD;  Location: [facility];  Service: Endoscopy;  Laterality: N/A;  . ERCP N/A 03/09/2016   Procedure: ENDOSCOPIC RETROGRADE CHOLANGIOPANCREATOGRAPHY (ERCP);  Surgeon: Ladene Artist, MD;  Location: [facility];  Service: Endoscopy;  Laterality: N/A;  . RML removed Dr Ricard Dillon for lung cancer  08/28/2008   T2N0 squamous cell CA  . TONSILLECTOMY AND ADENOIDECTOMY    . u/s guided aspiration of R breast cyst   2009   at the breast clinic   Social History   Tobacco Use  . Smoking status: Former Smoker    Years: 40.00  . Smokeless tobacco: Never Used  Substance Use Topics  . Alcohol use: No    Alcohol/week: 0.0 standard drinks   family history includes Diabetes in her son; Stroke in her father.  Medications: Current Outpatient Medications  Medication Sig Dispense Refill  . acetaminophen (TYLENOL) 500 MG tablet Take 500 mg by mouth every 6 (six) hours as needed (for pain).     Marland Kitchen ALPRAZolam (XANAX) 0.5 MG tablet TAKE 0.5-1 TABLETS (0.25-5 MG TOTAL) BY MOUTH 2 (TWO) TIMES DAILY AS NEEDED. 60 tablet 0  . amiodarone (PACERONE) 200 MG tablet TAKE 1 TABLET BY MOUTH EVERY DAY 90 tablet 1  . amLODipine (NORVASC) 5 MG tablet Take 1 tablet (5 mg total) by mouth daily. 90 tablet 3  . Docusate Sodium (STOOL SOFTENER LAXATIVE PO) Take by mouth as needed.    . furosemide (LASIX) 20 MG tablet Take 1 tablet (20 mg total) by mouth daily. 90 tablet 3  . lisinopril (PRINIVIL,ZESTRIL) 40 MG tablet Take 1 tablet (40 mg total) by mouth daily. 90 tablet 3  . polyethylene glycol powder (GLYCOLAX/MIRALAX) powder Take 17 g by mouth daily. 250 g 2  . rivaroxaban (XARELTO) 20 MG TABS tablet Take 1 tablet (20 mg total) by mouth daily with supper. 90 tablet 3  . traMADol  (ULTRAM) 50 MG tablet TAKE 1 TABLET BY MOUTH THREE TIMES A DAY AS NEEDED FOR PAIN 90 tablet 2  . diclofenac sodium (VOLTAREN) 1 % GEL Apply 2 g topically 4 (four) times daily. To affected joint. 100 g 11   No current facility-administered medications for this visit.    Allergies  Allergen Reactions  . Hyoscyamine Other (See Comments)    Mental status changes.  (as of 02/13/16 patient denies any problems with this medication)  . Rofecoxib Anaphylaxis and Hives  . Statins Other (See Comments)    Muscle aches      Discussed  warning signs or symptoms. Please see discharge instructions. Patient expresses understanding.  I personally was present and performed or re-performed the history, physical exam and medical decision-making activities of this service and have verified that the service and findings are accurately documented in the student's note. ___________________________________________ Lynne Leader M.D., ABFM., CAQSM. Primary Care and Sports Medicine Adjunct Instructor of [employer]

## 2018-10-18 ENCOUNTER — Other Ambulatory Visit: Payer: Self-pay | Admitting: Family Medicine

## 2018-10-18 DIAGNOSIS — F411 Generalized anxiety disorder: Secondary | ICD-10-CM

## 2018-10-20 ENCOUNTER — Other Ambulatory Visit: Payer: Self-pay

## 2018-10-20 DIAGNOSIS — F411 Generalized anxiety disorder: Secondary | ICD-10-CM

## 2018-10-20 MED ORDER — ALPRAZOLAM 0.5 MG PO TABS: ORAL_TABLET | ORAL | 0 refills | Status: DC

## 2018-10-20 NOTE — Telephone Encounter (Signed)
Patient requests a refill on Xanax.

## 2018-10-24 ENCOUNTER — Ambulatory Visit: Payer: Medicare HMO | Admitting: Family Medicine

## 2018-11-04 ENCOUNTER — Other Ambulatory Visit: Payer: Self-pay | Admitting: Cardiology

## 2018-11-16 ENCOUNTER — Other Ambulatory Visit: Payer: Self-pay | Admitting: Cardiology

## 2018-11-17 ENCOUNTER — Other Ambulatory Visit: Payer: Self-pay | Admitting: Family Medicine

## 2018-11-17 ENCOUNTER — Telehealth: Payer: Self-pay | Admitting: Cardiology

## 2018-11-17 DIAGNOSIS — F411 Generalized anxiety disorder: Secondary | ICD-10-CM

## 2018-11-17 NOTE — Telephone Encounter (Signed)
° ° °  Pt c/o BP issue: STAT if pt c/o blurred vision, one-sided weakness or slurred speech  1. What are your last 5 BP readings? 124/104 HR 123, 113/104  2. Are you having any other symptoms (ex. Dizziness, headache, blurred vision, passed out)? NO  3. What is your BP issue? Patient has concerns about HR and BP, requesting call from nurse. Patient states she has no symptoms

## 2018-11-17 NOTE — Telephone Encounter (Signed)
Laurie [name] has a follow up on 12/16/18.

## 2018-11-17 NOTE — Telephone Encounter (Signed)
Patient took BP again and had 127/101, HR is now 109   Patient states she feels better, will continue to monitor and call tomorrow if HR increases

## 2018-11-21 ENCOUNTER — Telehealth: Payer: Self-pay | Admitting: Cardiology

## 2018-11-21 NOTE — Telephone Encounter (Signed)
Spoke with pt, she reports her bp is elevated to 145/118 this morning and her heart rate is 100 bpm. She will take metoprolol 25 mg now and then continue to monitor and call me back. She reports it will be fine one day and then elevated the next. Aware she can take amlodipine 5 mg twice daily for continued elevation of bp. Patient voiced understanding

## 2018-11-21 NOTE — Telephone Encounter (Signed)
New message   Pt c/o BP issue: STAT if pt c/o blurred vision, one-sided weakness or slurred speech  1. What are your last 5 BP readings? 145/118bp   Hr 100   2. Are you having any other symptoms (ex. Dizziness, headache, blurred vision, passed out)? Lightheaded   3. What is your BP issue? Patient's b/p is elevated

## 2018-12-01 ENCOUNTER — Emergency Department (INDEPENDENT_AMBULATORY_CARE_PROVIDER_SITE_OTHER)
Admission: EM | Admit: 2018-12-01 | Discharge: 2018-12-01 | Disposition: A | Discharge status: Home / Self Care | Payer: Medicare HMO | Source: Home / Self Care | Attending: Family Medicine | Admitting: Family Medicine

## 2018-12-01 ENCOUNTER — Other Ambulatory Visit: Payer: Self-pay

## 2018-12-01 ENCOUNTER — Emergency Department (INDEPENDENT_AMBULATORY_CARE_PROVIDER_SITE_OTHER): Payer: Medicare HMO

## 2018-12-01 DIAGNOSIS — J181 Lobar pneumonia, unspecified organism: Secondary | ICD-10-CM | POA: Diagnosis not present

## 2018-12-01 DIAGNOSIS — J189 Pneumonia, unspecified organism: Secondary | ICD-10-CM

## 2018-12-01 DIAGNOSIS — J984 Other disorders of lung: Secondary | ICD-10-CM | POA: Diagnosis not present

## 2018-12-01 DIAGNOSIS — R05 Cough: Secondary | ICD-10-CM | POA: Diagnosis not present

## 2018-12-01 MED ORDER — IPRATROPIUM-ALBUTEROL 0.5-2.5 (3) MG/3ML IN SOLN
3.0000 mL | Freq: Once | RESPIRATORY_TRACT | Status: AC
  Administered 2018-12-01: 3 mL via RESPIRATORY_TRACT

## 2018-12-01 MED ORDER — PREDNISONE 20 MG PO TABS: ORAL_TABLET | ORAL | 0 refills | Status: DC

## 2018-12-01 MED ORDER — ALBUTEROL SULFATE (2.5 MG/3ML) 0.083% IN NEBU: 2.5000 mg | INHALATION_SOLUTION | RESPIRATORY_TRACT | 0 refills | Status: AC | PRN

## 2018-12-01 MED ORDER — AMOXICILLIN-POT CLAVULANATE 875-125 MG PO TABS: 1.0000 | ORAL_TABLET | Freq: Two times a day (BID) | ORAL | 0 refills | Status: DC

## 2018-12-01 MED ORDER — CEFTRIAXONE SODIUM 1 G IJ SOLR
1.0000 g | Freq: Once | INTRAMUSCULAR | Status: AC
  Administered 2018-12-01: 1 g via INTRAMUSCULAR

## 2018-12-01 MED ORDER — METHYLPREDNISOLONE SODIUM SUCC 40 MG IJ SOLR
80.0000 mg | Freq: Once | INTRAMUSCULAR | Status: AC
  Administered 2018-12-01: 80 mg via INTRAMUSCULAR

## 2018-12-01 NOTE — Discharge Instructions (Addendum)
°  It is highly recommended that you go to the hospital this evening for continued monitoring and treatment of your low oxygen readings.  Your oxygen level is in the high 80s at times but it is preferred to be above 90%.   Do not hesitate to call 911 or go to the closest hospital if you change your mind this evening or tomorrow morning.    You were given a shot of solumedrol (a steroid) today to help with inflammation in your lungs to help you breath better and to help with your cough.  You have been prescribed 5 days of prednisone, an oral steroid.  You may start this medication tomorrow with breakfast.    Please take antibiotics as prescribed and be sure to complete entire course even if you start to feel better to ensure infection does not come back.  Please follow up with family medicine in 1 week if not improving, sooner if worsening.

## 2018-12-01 NOTE — ED Provider Notes (Signed)
[name]    CSN: [id number] Arrival date & time: 12/01/18  1544     History   Chief Complaint Chief Complaint  Patient presents with  . Cough    HPI Laurie [name] is a 84 y.o. female.   HPI Laurie [name] is a 83 y.o. female presenting to UC with c/o 2-3 days of cough, chest congestion with tightness, fatigue, and poor appetite. Her daughter-in-law brought her for evaluation. Hx of COPD but she does not have inhalers due to cost concerns.  She did receive the flu and pneumonia vaccines. Denies n/v/d. Denies fever but she has had chills.   Past Medical History:  Diagnosis Date  . AAA (abdominal aortic aneurysm) ([place])   . Anemia    iron deficiency  . Ascending aorta dilatation (HCC)   . Atrial fibrillation ([place])    post op  . CAD (coronary artery disease)   . Carcinoma of lung ([place]) 08/28/2008   T2N0 moderately diff. squamous cell CA  . Cerebrovascular disease   . COPD (chronic obstructive pulmonary disease) ([place]) 2005   on CXR with R apical scaring  . Headache(784.0)    Hx: of migraines in the past  . Hypercholesterolemia    Hx: of  . Hypertension   . Incidental pulmonary nodule, > 70mm and < 9mm    LLL  . S/P aortic valve replacement 08/28/2008   [facility]  . S/P lobectomy of lung 08/28/2008   rt middle lobe  dr. Roxy Manns  . Shingles 11-09    Patient Active Problem List   Diagnosis Date Noted  . Low grade squamous intraepithelial lesion (LGSIL) on cervicovaginal cytologic smear 04/08/2018  . Paroxysmal atrial fibrillation ([place]) 02/25/2018  . Sinus bradycardia 02/25/2018  . Incidental pulmonary nodule, > 63mm and < 51mm   . Claudication ([place]) 04/22/2016  . Dyspnea   . Atrial fibrillation with RVR ([place])   . COPD (chronic obstructive pulmonary disease) ([place])   . RUQ abdominal pain   . Biliary calculus   . Pulmonary emphysema ([place])   . Aortic valve disease   . Thoracic aortic aneurysm without rupture ([place]) 08/19/2015  . Ascending aorta  dilatation (H[facility])   . Abdominal aortic aneurysm ([place]) 08/26/2011  . HYPERGLYCEMIA, MILD 11/10/2010  . OSTEOPENIA 09/29/2010  . Coronary atherosclerosis 05/15/2009  . Bilateral carotid artery disease ([place]) 05/15/2009  . TIA 05/15/2009  . ILIAC ARTERY ANEURYSM 05/15/2009  . GAD (generalized anxiety disorder) 03/20/2009  . THYROID NODULE 01/09/2009  . THYROMEGALY 01/08/2009  . SIALOLITHIASIS 01/08/2009  . MALIGNANT NEOPLASM MIDDLE LOBE BRONCHUS OR LUNG 10/24/2008  . HLD (hyperlipidemia) 10/24/2008  . AORTIC VALVE REPLACEMENT, HX OF 10/24/2008  . S/P aortic valve replacement 08/28/2008  . DIVERTICULOSIS OF COLON 06/19/2008  . ANEMIA, IRON DEFICIENCY 06/06/2008  . Essential hypertension, benign 06/06/2008  . GASTROINTESTINAL HEMORRHAGE, HX OF 06/06/2008    Past Surgical History:  Procedure Laterality Date  . ABDOMINAL SURGERY    . AORTIC VALVE REPLACEMENT  08/28/2008   #23mm [facility] I[facility] Ease pericardial tissue valve  . APPENDECTOMY    . CARDIAC CATHETERIZATION    . CATARACT EXTRACTION W/ INTRAOCULAR LENS  IMPLANT, BILATERAL Bilateral   . CHOLECYSTECTOMY N/A 12/13/2013   Procedure: LAPAROSCOPIC CHOLECYSTECTOMY;  Surgeon: Ralene Ok, MD;  Location: [facility];  Service: General;  Laterality: N/A;  . COLONOSCOPY     \  . DILATION AND CURETTAGE OF UTERUS    . ERCP N/A 05/08/2015   Procedure: ENDOSCOPIC RETROGRADE CHOLANGIOPANCREATOGRAPHY (ERCP);  Surgeon: Inda Castle, MD;  Location: [facility];  Service: Endoscopy;  Laterality: N/A;  . ERCP N/A 01/13/2016   Procedure: ENDOSCOPIC RETROGRADE CHOLANGIOPANCREATOGRAPHY (ERCP);  Surgeon: Doran Stabler, MD;  Location[facility];  Service: Endoscopy;  Laterality: N/A;  . ERCP N/A 03/09/2016   Procedure: ENDOSCOPIC RETROGRADE CHOLANGIOPANCREATOGRAPHY (ERCP);  Surgeon: Ladene Artist, MD;  Location: [facility];  Service: Endoscopy;  Laterality: N/A;  . RML removed Dr Ricard Dillon for lung cancer  08/28/2008   T2N0 squamous cell CA  .  TONSILLECTOMY AND ADENOIDECTOMY    . u/s guided aspiration of R breast cyst   2009   at the breast clinic    OB History    Gravida  2   Para  2   Term      Preterm      AB      Living        SAB      TAB      Ectopic      Multiple      Live Births  2            Home Medications    Prior to Admission medications   Medication Sig Start Date End Date Taking? Authorizing Provider  acetaminophen (TYLENOL) 500 MG tablet Take 500 mg by mouth every 6 (six) hours as needed (for pain).     [provider]  albuterol (PROVENTIL) (2.5 MG/3ML) 0.083% nebulizer solution Take 3 mLs (2.5 mg total) by nebulization every 4 (four) hours as needed for wheezing or shortness of breath. 12/01/18   Noe Gens, PA-C  ALPRAZolam Duanne Moron) 0.25 MG tablet TAKE 1-2 TABLETS (0.25MG  TO 0.5MG  TOTAL) BY MOUTH TWICE DAILY AS NEEDED 11/18/18   Hali Marry, MD  amiodarone (PACERONE) 200 MG tablet TAKE 1 TABLET BY MOUTH EVERY DAY 06/07/18   Lelon Perla, MD  amLODipine (NORVASC) 5 MG tablet Take 1 tablet (5 mg total) by mouth daily. 05/10/18   Lelon Perla, MD  amoxicillin-clavulanate (AUGMENTIN) 875-125 MG tablet Take 1 tablet by mouth 2 (two) times daily. One po bid x 7 days 12/01/18   Noe Gens, PA-C  diclofenac sodium (VOLTAREN) 1 % GEL Apply 2 g topically 4 (four) times daily. To affected joint. 09/26/18   Gregor Hams, MD  Docusate Sodium (STOOL SOFTENER LAXATIVE PO) Take by mouth as needed.    [provider]  furosemide (LASIX) 20 MG tablet Take 1 tablet (20 mg total) by mouth daily. 06/30/18 09/28/18  Lelon Perla, MD  lisinopril (PRINIVIL,ZESTRIL) 40 MG tablet TAKE 1 TABLET BY MOUTH EVERY DAY 11/07/18   Lelon Perla, MD  polyethylene glycol powder (GLYCOLAX/MIRALAX) powder Take 17 g by mouth daily. 08/11/18   Doran Stabler, MD  predniSONE (DELTASONE) 20 MG tablet 3 tabs po day one, then 2 po daily x 4 days 12/01/18   Noe Gens, PA-C    traMADol (ULTRAM) 50 MG tablet TAKE 1 TABLET BY MOUTH THREE TIMES A DAY AS NEEDED FOR PAIN 09/15/18   Hali Marry, MD  XARELTO 20 MG TABS tablet TAKE 1 TABLET (20 MG TOTAL) BY MOUTH DAILY WITH SUPPER. 11/17/18   Stanford Breed Denice Bors, MD    Family History Family History  Problem Relation Age of Onset  . Stroke Father   . Diabetes Son        brittle diabetes  . Colon polyps Neg Hx   . Colon cancer Neg  Hx   . Esophageal cancer Neg Hx   . Stomach cancer Neg Hx   . Pancreatic cancer Neg Hx   . Kidney disease Neg Hx   . Liver disease Neg Hx     Social History Social History   Tobacco Use  . Smoking status: Former Smoker    Years: 40.00  . Smokeless tobacco: Never Used  Substance Use Topics  . Alcohol use: No    Alcohol/week: 0.0 standard drinks  . Drug use: No     Allergies   Hyoscyamine; Rofecoxib; and Statins   Review of Systems Review of Systems  Constitutional: Positive for chills. Negative for fever.  HENT: Positive for congestion. Negative for ear pain, sore throat, trouble swallowing and voice change.   Respiratory: Positive for cough and chest tightness. Negative for shortness of breath.   Cardiovascular: Negative for chest pain and palpitations.  Gastrointestinal: Negative for abdominal pain, diarrhea, nausea and vomiting.  Musculoskeletal: Negative for arthralgias, back pain and myalgias.  Skin: Negative for rash.     Physical Exam Triage Vital Signs ED Triage Vitals  Enc Vitals Group     BP      Pulse      Resp      Temp      Temp src      SpO2      Weight      Height      Head Circumference      Peak Flow      Pain Score      Pain Loc      Pain Edu?      Excl. in [place]?    No data found.  Updated Vital Signs BP 105/66 (BP Location: Right Arm)   Pulse 79   Temp 98.1 F (36.7 C) (Oral)   Resp 20   Ht 5\' 6"  (1.676 m)   Wt 175 lb (79.4 kg)   SpO2 90%   BMI 28.25 kg/m   Visual Acuity Right Eye Distance:   Left Eye Distance:    Bilateral Distance:    Right Eye Near:   Left Eye Near:    Bilateral Near:     Physical Exam Vitals signs and nursing note reviewed.  Constitutional:      Appearance: Normal appearance. She is well-developed.  HENT:     Head: Normocephalic and atraumatic.     Right Ear: Tympanic membrane normal.     Left Ear: Tympanic membrane normal.     Nose: Nose normal.     Right Sinus: No maxillary sinus tenderness or frontal sinus tenderness.     Left Sinus: No maxillary sinus tenderness or frontal sinus tenderness.     Mouth/Throat:     Lips: Pink.     Mouth: Mucous membranes are moist.     Pharynx: Oropharynx is clear. Uvula midline.  Neck:     Musculoskeletal: Normal range of motion.  Cardiovascular:     Rate and Rhythm: Normal rate and regular rhythm.  Pulmonary:     Effort: Pulmonary effort is normal.     Breath sounds: No stridor. Wheezing and rhonchi present.     Comments: Diffuse wheeze and rhonchi.  Slightly winded after 2-3 sentences.  Musculoskeletal: Normal range of motion.  Skin:    General: Skin is warm and dry.  Neurological:     Mental Status: She is alert and oriented to person, place, and time.  Psychiatric:        Behavior: Behavior normal.  UC Treatments / Results  Labs (all labs ordered are listed, but only abnormal results are displayed) Labs Reviewed - No data to display  EKG None  Radiology Dg Chest 2 View  Result Date: 12/01/2018 CLINICAL DATA:  Patient with cough. EXAM: CHEST - 2 VIEW COMPARISON:  Chest radiograph 02/23/2018 FINDINGS: Stable cardiac and mediastinal contours status post median sternotomy. Aortic atherosclerosis. Minimal heterogeneous opacities left lung base. Right mid lung scarring. No pleural effusion or pneumothorax. Pulmonary hyperinflation. Thoracic spine degenerative changes. IMPRESSION: Minimal heterogeneous opacities left lung base may represent atelectasis or infection. Followup PA and lateral chest X-ray is recommended  in 3-4 weeks following trial of antibiotic therapy to ensure resolution and exclude underlying malignancy. Electronically Signed   By: Lovey Newcomer M.D.   On: 12/01/2018 16:49    Procedures Procedures (including critical care time)  Medications Ordered in UC Medications  ipratropium-albuterol (DUONEB) 0.5-2.5 (3) MG/3ML nebulizer solution 3 mL (3 mLs Nebulization Given 12/01/18 1733)  methylPREDNISolone sodium succinate (SOLU-MEDROL) 40 mg/mL injection 80 mg (80 mg Intramuscular Given 12/01/18 1733)  cefTRIAXone (ROCEPHIN) injection 1 g (1 g Intramuscular Given 12/01/18 1733)    Initial Impression / Assessment and Plan / UC Course  I have reviewed the triage vital signs and the nursing notes.  Pertinent labs & imaging results that were available during my care of the patient were reviewed by me and considered in my medical decision making (see chart for details).     Discussed imaging with pt and daughter-in-law.  Duoneb, solumedrol and Rocephin given in UC Lung sounds improved moderately. O2 Sat remained around 89-90% even during ambulation. Pt states she feels "okay" and feels comfortable being discharged home.   Consulted with Dr. Assunta Found who also recommends having pt go to the emergency department for likely admission for monitoring of O2 Sat. Pt refusing to go to the hospital. Pt has a dog at home she needs to tend to. Daughter in [place] offered to have her stay over at her house, pt declined. Family agree to frequently check on pt.   Final Clinical Impressions(s) / UC Diagnoses   Final diagnoses:  Community acquired pneumonia of left lower lobe of lung [facility])     Discharge Instructions      It is highly recommended that you go to the hospital this evening for continued monitoring and treatment of your low oxygen readings.  Your oxygen level is in the high 80s at times but it is preferred to be above 90%.   Do not hesitate to call 911 or go to the closest hospital if you change your  mind this evening or tomorrow morning.    You were given a shot of solumedrol (a steroid) today to help with inflammation in your lungs to help you breath better and to help with your cough.  You have been prescribed 5 days of prednisone, an oral steroid.  You may start this medication tomorrow with breakfast.    Please take antibiotics as prescribed and be sure to complete entire course even if you start to feel better to ensure infection does not come back.  Please follow up with family medicine in 1 week if not improving, sooner if worsening.     ED Prescriptions    Medication Sig Dispense Auth. Provider   predniSONE (DELTASONE) 20 MG tablet 3 tabs po day one, then 2 po daily x 4 days 11 tablet Masaichi Kracht O, PA-C   albuterol (PROVENTIL) (2.5 MG/3ML) 0.083% nebulizer solution Take 3  mLs (2.5 mg total) by nebulization every 4 (four) hours as needed for wheezing or shortness of breath. 30 vial Leeroy Cha O, PA-C   amoxicillin-clavulanate (AUGMENTIN) 875-125 MG tablet Take 1 tablet by mouth 2 (two) times daily. One po bid x 7 days 14 tablet Noe Gens, [place]     Controlled Substance Prescriptions [place] Controlled Substance Registry consulted? Not Applicable   Tyrell Antonio 12/01/18 1906

## 2018-12-01 NOTE — ED Triage Notes (Signed)
Chest congestion and cough, poor appetite, fatigue.  Feels like chest is tight, productive cough, not "much color"

## 2018-12-02 ENCOUNTER — Telehealth: Payer: Self-pay

## 2018-12-02 NOTE — Telephone Encounter (Signed)
Spoke with daughter in law who was here with her last night.  O2 sat is 90-91%.  She has picked up all the medication, and is going to get the NEB today.  Went over sx to take patient to ER.  Will follow up as needed.

## 2018-12-04 ENCOUNTER — Telehealth: Payer: Self-pay | Admitting: Cardiology

## 2018-12-04 NOTE — Telephone Encounter (Signed)
Ms Laurie [name] called the answering service and I called her back.  She states that she was diagnosed with pneumonia on 12/01/2018 and is taking an antibiotic, prednisone and nebulizer treatments last night she had about a 1 hour episode of A. fib with RVR during which time her rate was about 150 and her chest was burning.  This morning she had another episode of fast heart rate and chest burning that lasted about 2 hours.  About a week ago she was having some breakthrough A. fib and was told by our office to go back to taking her old metoprolol 50 mg, 1/2 pill.  Currently her rate is normal and she feels fine.  She feels like the prednisone and breathing treatments are contributing and wanted to stop her prednisone.  I advised her to go ahead and decrease her dose to the next taper and only use the nebulizer if she needs it.  She agrees.  She will call the office tomorrow if she continues to have problems with breakthrough A. Fib.

## 2018-12-05 ENCOUNTER — Encounter (HOSPITAL_COMMUNITY): Payer: Self-pay | Admitting: Emergency Medicine

## 2018-12-05 ENCOUNTER — Emergency Department (HOSPITAL_COMMUNITY): Payer: Medicare HMO

## 2018-12-05 ENCOUNTER — Other Ambulatory Visit: Payer: Self-pay

## 2018-12-05 ENCOUNTER — Encounter (HOSPITAL_COMMUNITY)
Admission: EM | Disposition: A | Discharge status: Home / Self Care | Payer: Self-pay | Source: Home / Self Care | Attending: Emergency Medicine

## 2018-12-05 ENCOUNTER — Ambulatory Visit (HOSPITAL_COMMUNITY)
Admission: EM | Admit: 2018-12-05 | Discharge: 2018-12-06 | Disposition: A | Discharge status: Home / Self Care | Payer: Medicare HMO | Attending: Internal Medicine | Admitting: Internal Medicine

## 2018-12-05 ENCOUNTER — Emergency Department (INDEPENDENT_AMBULATORY_CARE_PROVIDER_SITE_OTHER)
Admission: EM | Admit: 2018-12-05 | Discharge: 2018-12-05 | Disposition: A | Discharge status: Home / Self Care | Payer: Medicare HMO | Source: Home / Self Care | Attending: Emergency Medicine | Admitting: Emergency Medicine

## 2018-12-05 ENCOUNTER — Telehealth: Payer: Self-pay | Admitting: Cardiology

## 2018-12-05 DIAGNOSIS — I480 Paroxysmal atrial fibrillation: Secondary | ICD-10-CM | POA: Diagnosis not present

## 2018-12-05 DIAGNOSIS — J189 Pneumonia, unspecified organism: Secondary | ICD-10-CM | POA: Diagnosis not present

## 2018-12-05 DIAGNOSIS — R0600 Dyspnea, unspecified: Secondary | ICD-10-CM

## 2018-12-05 DIAGNOSIS — J449 Chronic obstructive pulmonary disease, unspecified: Secondary | ICD-10-CM | POA: Insufficient documentation

## 2018-12-05 DIAGNOSIS — E7800 Pure hypercholesterolemia, unspecified: Secondary | ICD-10-CM | POA: Diagnosis not present

## 2018-12-05 DIAGNOSIS — I4891 Unspecified atrial fibrillation: Secondary | ICD-10-CM | POA: Diagnosis not present

## 2018-12-05 DIAGNOSIS — Z902 Acquired absence of lung [part of]: Secondary | ICD-10-CM | POA: Insufficient documentation

## 2018-12-05 DIAGNOSIS — I959 Hypotension, unspecified: Secondary | ICD-10-CM | POA: Diagnosis not present

## 2018-12-05 DIAGNOSIS — I495 Sick sinus syndrome: Secondary | ICD-10-CM | POA: Diagnosis present

## 2018-12-05 DIAGNOSIS — E785 Hyperlipidemia, unspecified: Secondary | ICD-10-CM | POA: Diagnosis not present

## 2018-12-05 DIAGNOSIS — I712 Thoracic aortic aneurysm, without rupture: Secondary | ICD-10-CM | POA: Insufficient documentation

## 2018-12-05 DIAGNOSIS — J181 Lobar pneumonia, unspecified organism: Secondary | ICD-10-CM

## 2018-12-05 DIAGNOSIS — E041 Nontoxic single thyroid nodule: Secondary | ICD-10-CM | POA: Diagnosis not present

## 2018-12-05 DIAGNOSIS — M8580 Other specified disorders of bone density and structure, unspecified site: Secondary | ICD-10-CM | POA: Insufficient documentation

## 2018-12-05 DIAGNOSIS — D509 Iron deficiency anemia, unspecified: Secondary | ICD-10-CM | POA: Insufficient documentation

## 2018-12-05 DIAGNOSIS — Z823 Family history of stroke: Secondary | ICD-10-CM | POA: Diagnosis not present

## 2018-12-05 DIAGNOSIS — I700 Atherosclerosis of aorta: Secondary | ICD-10-CM | POA: Diagnosis not present

## 2018-12-05 DIAGNOSIS — I10 Essential (primary) hypertension: Secondary | ICD-10-CM | POA: Diagnosis not present

## 2018-12-05 DIAGNOSIS — Z955 Presence of coronary angioplasty implant and graft: Secondary | ICD-10-CM | POA: Diagnosis not present

## 2018-12-05 DIAGNOSIS — Z87891 Personal history of nicotine dependence: Secondary | ICD-10-CM | POA: Insufficient documentation

## 2018-12-05 DIAGNOSIS — I714 Abdominal aortic aneurysm, without rupture: Secondary | ICD-10-CM | POA: Diagnosis not present

## 2018-12-05 DIAGNOSIS — Z888 Allergy status to other drugs, medicaments and biological substances status: Secondary | ICD-10-CM | POA: Insufficient documentation

## 2018-12-05 DIAGNOSIS — Z85118 Personal history of other malignant neoplasm of bronchus and lung: Secondary | ICD-10-CM | POA: Insufficient documentation

## 2018-12-05 DIAGNOSIS — Z953 Presence of xenogenic heart valve: Secondary | ICD-10-CM | POA: Diagnosis not present

## 2018-12-05 DIAGNOSIS — Z79899 Other long term (current) drug therapy: Secondary | ICD-10-CM | POA: Insufficient documentation

## 2018-12-05 DIAGNOSIS — Z950 Presence of cardiac pacemaker: Secondary | ICD-10-CM

## 2018-12-05 DIAGNOSIS — D72829 Elevated white blood cell count, unspecified: Secondary | ICD-10-CM | POA: Diagnosis not present

## 2018-12-05 DIAGNOSIS — Z7901 Long term (current) use of anticoagulants: Secondary | ICD-10-CM | POA: Insufficient documentation

## 2018-12-05 DIAGNOSIS — R42 Dizziness and giddiness: Secondary | ICD-10-CM | POA: Diagnosis not present

## 2018-12-05 DIAGNOSIS — R000 Tachycardia, unspecified: Secondary | ICD-10-CM | POA: Diagnosis not present

## 2018-12-05 DIAGNOSIS — I2510 Atherosclerotic heart disease of native coronary artery without angina pectoris: Secondary | ICD-10-CM | POA: Insufficient documentation

## 2018-12-05 HISTORY — PX: PACEMAKER IMPLANT: EP1218

## 2018-12-05 LAB — BASIC METABOLIC PANEL
ANION GAP: 7 (ref 5–15)
BUN: 16 mg/dL (ref 8–23)
CO2: 27 mmol/L (ref 22–32)
Calcium: 8.8 mg/dL — ABNORMAL LOW (ref 8.9–10.3)
Chloride: 97 mmol/L — ABNORMAL LOW (ref 98–111)
Creatinine, Ser: 1.33 mg/dL — ABNORMAL HIGH (ref 0.44–1.00)
GFR calc Af Amer: 43 mL/min — ABNORMAL LOW (ref >60)
GFR calc non Af Amer: 37 mL/min — ABNORMAL LOW (ref >60)
Glucose, Bld: 90 mg/dL (ref 70–99)
Potassium: 3.8 mmol/L (ref 3.5–5.1)
Sodium: 131 mmol/L — ABNORMAL LOW (ref 135–145)

## 2018-12-05 LAB — CBC
HCT: 41.6 % (ref 36.0–46.0)
Hemoglobin: 13.4 g/dL (ref 12.0–15.0)
MCH: 29.3 pg (ref 26.0–34.0)
MCHC: 32.2 g/dL (ref 30.0–36.0)
MCV: 90.8 fL (ref 80.0–100.0)
NRBC: 0 % (ref 0.0–0.2)
Platelets: 388 10*3/uL (ref 150–400)
RBC: 4.58 MIL/uL (ref 3.87–5.11)
RDW: 13 % (ref 11.5–15.5)
WBC: 18.1 10*3/uL — ABNORMAL HIGH (ref 4.0–10.5)

## 2018-12-05 LAB — MAGNESIUM: Magnesium: 2.1 mg/dL (ref 1.7–2.4)

## 2018-12-05 LAB — I-STAT TROPONIN, ED: Troponin i, poc: 0.04 ng/mL (ref 0.00–0.08)

## 2018-12-05 LAB — PHOSPHORUS: Phosphorus: 3.3 mg/dL (ref 2.5–4.6)

## 2018-12-05 SURGERY — PACEMAKER IMPLANT

## 2018-12-05 MED ORDER — LIDOCAINE HCL (PF) 1 % IJ SOLN
INTRAMUSCULAR | Status: DC | PRN
  Administered 2018-12-05: 45 mL

## 2018-12-05 MED ORDER — METOPROLOL TARTRATE 5 MG/5ML IV SOLN
INTRAVENOUS | Status: DC | PRN
  Administered 2018-12-05: 5 mg via INTRAVENOUS

## 2018-12-05 MED ORDER — MIDAZOLAM HCL 5 MG/5ML IJ SOLN
INTRAMUSCULAR | Status: AC
  Filled 2018-12-05: qty 5

## 2018-12-05 MED ORDER — SODIUM CHLORIDE 0.9% FLUSH: 3.0000 mL | Freq: Two times a day (BID) | INTRAVENOUS | Status: DC

## 2018-12-05 MED ORDER — SODIUM CHLORIDE 0.9% FLUSH: 3.0000 mL | INTRAVENOUS | Status: DC | PRN

## 2018-12-05 MED ORDER — SODIUM CHLORIDE 0.9 % IV SOLN: 250.0000 mL | INTRAVENOUS | Status: DC

## 2018-12-05 MED ORDER — MIDAZOLAM HCL 5 MG/5ML IJ SOLN
INTRAMUSCULAR | Status: DC | PRN
  Administered 2018-12-05 (×3): 1 mg via INTRAVENOUS

## 2018-12-05 MED ORDER — ACETAMINOPHEN 325 MG PO TABS: 325.0000 mg | ORAL_TABLET | ORAL | Status: DC | PRN

## 2018-12-05 MED ORDER — FENTANYL CITRATE (PF) 100 MCG/2ML IJ SOLN
INTRAMUSCULAR | Status: DC | PRN
  Administered 2018-12-05 (×3): 12.5 ug via INTRAVENOUS

## 2018-12-05 MED ORDER — SODIUM CHLORIDE 0.9 % IV SOLN: INTRAVENOUS | Status: DC

## 2018-12-05 MED ORDER — AMIODARONE HCL 200 MG PO TABS
200.0000 mg | ORAL_TABLET | Freq: Every day | ORAL | Status: DC
  Administered 2018-12-06: 200 mg via ORAL
  Filled 2018-12-05 (×2): qty 1

## 2018-12-05 MED ORDER — FUROSEMIDE 20 MG PO TABS
20.0000 mg | ORAL_TABLET | Freq: Every day | ORAL | Status: DC
  Administered 2018-12-06: 20 mg via ORAL
  Filled 2018-12-05: qty 1

## 2018-12-05 MED ORDER — CHLORHEXIDINE GLUCONATE 4 % EX LIQD
60.0000 mL | Freq: Once | CUTANEOUS | Status: DC
  Filled 2018-12-05: qty 60

## 2018-12-05 MED ORDER — LISINOPRIL 40 MG PO TABS
40.0000 mg | ORAL_TABLET | Freq: Every day | ORAL | Status: DC
  Administered 2018-12-06: 40 mg via ORAL
  Filled 2018-12-05: qty 1

## 2018-12-05 MED ORDER — TRAMADOL HCL 50 MG PO TABS
50.0000 mg | ORAL_TABLET | Freq: Three times a day (TID) | ORAL | Status: DC | PRN
  Administered 2018-12-05 – 2018-12-06 (×3): 50 mg via ORAL
  Filled 2018-12-05 (×3): qty 1

## 2018-12-05 MED ORDER — HEPARIN (PORCINE) IN NACL 1000-0.9 UT/500ML-% IV SOLN
INTRAVENOUS | Status: AC
  Filled 2018-12-05: qty 500

## 2018-12-05 MED ORDER — SODIUM CHLORIDE 0.9 % IV BOLUS: 1000.0000 mL | Freq: Once | INTRAVENOUS | Status: DC

## 2018-12-05 MED ORDER — ACETAMINOPHEN 500 MG PO TABS: 500.0000 mg | ORAL_TABLET | Freq: Four times a day (QID) | ORAL | Status: DC | PRN

## 2018-12-05 MED ORDER — CEFAZOLIN SODIUM-DEXTROSE 2-4 GM/100ML-% IV SOLN
2.0000 g | INTRAVENOUS | Status: AC
  Administered 2018-12-05: 2 g via INTRAVENOUS
  Filled 2018-12-05: qty 100

## 2018-12-05 MED ORDER — METOPROLOL TARTRATE 5 MG/5ML IV SOLN
INTRAVENOUS | Status: AC
  Filled 2018-12-05: qty 5

## 2018-12-05 MED ORDER — AMOXICILLIN-POT CLAVULANATE 875-125 MG PO TABS
1.0000 | ORAL_TABLET | Freq: Two times a day (BID) | ORAL | Status: DC
  Administered 2018-12-05 – 2018-12-06 (×2): 1 via ORAL
  Filled 2018-12-05 (×2): qty 1

## 2018-12-05 MED ORDER — SODIUM CHLORIDE 0.9 % IV SOLN
INTRAVENOUS | Status: AC
  Filled 2018-12-05: qty 2

## 2018-12-05 MED ORDER — AMLODIPINE BESYLATE 2.5 MG PO TABS
5.0000 mg | ORAL_TABLET | Freq: Every day | ORAL | Status: DC
  Filled 2018-12-05: qty 2

## 2018-12-05 MED ORDER — FENTANYL CITRATE (PF) 100 MCG/2ML IJ SOLN
INTRAMUSCULAR | Status: AC
  Filled 2018-12-05: qty 2

## 2018-12-05 MED ORDER — CEFAZOLIN SODIUM-DEXTROSE 2-4 GM/100ML-% IV SOLN
INTRAVENOUS | Status: AC
  Filled 2018-12-05: qty 100

## 2018-12-05 MED ORDER — SODIUM CHLORIDE 0.9 % IV SOLN
80.0000 mg | INTRAVENOUS | Status: AC
  Administered 2018-12-05: 80 mg

## 2018-12-05 MED ORDER — ALBUTEROL SULFATE (2.5 MG/3ML) 0.083% IN NEBU: 2.5000 mg | INHALATION_SOLUTION | RESPIRATORY_TRACT | Status: DC | PRN

## 2018-12-05 MED ORDER — HEPARIN (PORCINE) IN NACL 1000-0.9 UT/500ML-% IV SOLN
INTRAVENOUS | Status: DC | PRN
  Administered 2018-12-05: 500 mL

## 2018-12-05 MED ORDER — ONDANSETRON HCL 4 MG/2ML IJ SOLN: 4.0000 mg | Freq: Four times a day (QID) | INTRAMUSCULAR | Status: DC | PRN

## 2018-12-05 MED ORDER — CEFAZOLIN SODIUM-DEXTROSE 1-4 GM/50ML-% IV SOLN
1.0000 g | Freq: Three times a day (TID) | INTRAVENOUS | Status: AC
  Administered 2018-12-05 – 2018-12-06 (×3): 1 g via INTRAVENOUS
  Filled 2018-12-05 (×3): qty 50

## 2018-12-05 MED ORDER — LIDOCAINE HCL (PF) 1 % IJ SOLN
INTRAMUSCULAR | Status: AC
  Filled 2018-12-05: qty 60

## 2018-12-05 MED ORDER — SODIUM CHLORIDE 0.9 % IV BOLUS (SEPSIS)
500.0000 mL | Freq: Once | INTRAVENOUS | Status: AC
  Administered 2018-12-05: 500 mL via INTRAVENOUS

## 2018-12-05 MED ORDER — POLYETHYLENE GLYCOL 3350 17 GM/SCOOP PO POWD
17.0000 g | Freq: Every day | ORAL | Status: DC
  Filled 2018-12-05: qty 255

## 2018-12-05 MED ORDER — POLYETHYLENE GLYCOL 3350 17 G PO PACK
17.0000 g | PACK | Freq: Every day | ORAL | Status: DC
  Administered 2018-12-06: 17 g via ORAL
  Filled 2018-12-05 (×2): qty 1

## 2018-12-05 SURGICAL SUPPLY — 8 items
CABLE SURGICAL S-101-97-12 (CABLE) ×2 IMPLANT
KIT MICROPUNCTURE NIT STIFF (SHEATH) ×1 IMPLANT
LEAD TENDRIL MRI 46CM LPA1200M (Lead) ×1 IMPLANT
LEAD TENDRIL MRI 52CM LPA1200M (Lead) ×1 IMPLANT
PACEMAKER ASSURITY DR-RF (Pacemaker) ×1 IMPLANT
PAD PRO RADIOLUCENT 2001M-C (PAD) ×2 IMPLANT
SHEATH CLASSIC 8F (SHEATH) ×2 IMPLANT
TRAY PACEMAKER INSERTION (PACKS) ×2 IMPLANT

## 2018-12-05 NOTE — ED Triage Notes (Signed)
Pt feels like her heart is racing.  She said that the last 2 nights she has felt her heart racing as she is going to bed.

## 2018-12-05 NOTE — ED Triage Notes (Signed)
Pt complains generalized weakness and was taken to urgent care.  Pt has recent history of pneumonia.  Pt is in AFIB with rates as high as 133 down to 77.  Pt is AOx4 NAD noted at this time. Pt on Eliquis and recently started on Metoprolol x1week

## 2018-12-05 NOTE — H&P (Addendum)
H&P   Patient ID: Laurie [name] MRN: [id number]; DOB: Nov 30, 1935  Admit date: 12/05/2018 Date of Consult: 12/05/2018  Primary Care Provider: Hali Marry, MD Primary Cardiologist: Dr. Stanford Breed Primary Electrophysiologist:  None    Patient Profile:   Laurie [name] is a 84 y.o. female with a hx of bVHD w/bioprosthetic AVR and septal myomectomy in 2009, lung cancer s/p RMLobectomy, COPD, HTN, no known obstructive CAD, PVD with duplex that showed significant distal aortic and bilateral common iliac artery stenosis with moderate left SFA stenosis, has been evaluated by Dr Fletcher Anon recommended medical therapy, Chest CT May 2019 showed 4.7 cm ascending in 4 cm descending thoracic aortic aneurysm.  3.7 cm infrarenal abdominal aortic aneurysm, the patient not wanting to ci=onsider surgical/invasive therapies, not planned for further w/u, and paroxysmal AFib  who is being seen today for the evaluation of AFib w/RVR at the request of Dr. Venora Maples.  History of Present Illness:   Ms. Sharpless last saw Dr. Stanford Breed May 2019, at that time feeling well, noted maintaining SR on amiodarone, and historically BB had been reduced (and appears ultimately stopped) 2/2 bradycardia.  No changes were made to her therapy.  Most recently 12/01/2018, she was seen at an [facility] with c/o cough, chest congestion, fatigue, poor appetite and chest tightness, CXR suggested infection vs atelectasis, BP was 105/66, afebrile, HR 79, and RA sat 90%, treated with duoneb, solumedrol and dose of Rocefin with improvement in SOB, sats hanging 89-90% and recommended ER for further evaluation/management though she declined and Rx prednisone dose pack, albuterol neb, and Augmentin.  Yesterday she called our service with c/o increased palpitations/fast HR she felt the steroid and nebulizer were causing, she was instructed to reduce the steroid to the next taper dose and use nebs PRN only.  And resume her prior metoprolol 50mg  1/2 tab  (daily presumably).  She comes to the ER today with c/o "tachycardia"  BP initially soft 88-90's thought after IVF maintaining 110-120SBP, she is afebrile, 95% on RA  She reports last night could tell she was out of rhythm and her HR fast with symptoms and by her pulse-ox.  She spent the evening relaxing and eventually went to sleep, this Am she woke feeling the same, with burning in her chest fast HR, and generally fatigued and came in.  She reports she has been back in 25mg  of the metoprolol about a week now after d/w RN in the office noting irregularity in her pulse.  LABS K+ 3.8 BUN/Creat 16/1.33 poc Trop 0.04 WBC 18.1 H/H 13/41 Plts 388  AAD hx: Amiodarone, looks like since 2018  Past Medical History:  Diagnosis Date  . AAA (abdominal aortic aneurysm) ([place])   . Anemia    iron deficiency  . Ascending aorta dilatation ([facility])   . Atrial fibrillation ([place])    post op  . CAD (coronary artery disease)   . Carcinoma of lung ([place]) 08/28/2008   T2N0 moderately diff. squamous cell CA  . Cerebrovascular disease   . COPD (chronic obstructive pulmonary disease) ([place]) 2005   on CXR with R apical scaring  . Headache(784.0)    Hx: of migraines in the past  . Hypercholesterolemia    Hx: of  . Hypertension   . Incidental pulmonary nodule, > 110mm and < 69mm    LLL  . S/P aortic valve replacement 08/28/2008   [facility]  . S/P lobectomy of lung 08/28/2008   rt middle lobe  dr. Roxy Manns  . Shingles 11-09  Past Surgical History:  Procedure Laterality Date  . ABDOMINAL SURGERY    . AORTIC VALVE REPLACEMENT  08/28/2008   #83mm [facility] Ease pericardial tissue valve  . APPENDECTOMY    . CARDIAC CATHETERIZATION    . CATARACT EXTRACTION W/ INTRAOCULAR LENS  IMPLANT, BILATERAL Bilateral   . CHOLECYSTECTOMY N/A 12/13/2013   Procedure: LAPAROSCOPIC CHOLECYSTECTOMY;  Surgeon: Ralene Ok, MD;  Location: [place];  Service: General;  Laterality: N/A;  . COLONOSCOPY     \  . DILATION AND  CURETTAGE OF UTERUS    . ERCP N/A 05/08/2015   Procedure: ENDOSCOPIC RETROGRADE CHOLANGIOPANCREATOGRAPHY (ERCP);  Surgeon: Inda Castle, MD;  Location: [place];  Service: Endoscopy;  Laterality: N/A;  . ERCP N/A 01/13/2016   Procedure: ENDOSCOPIC RETROGRADE CHOLANGIOPANCREATOGRAPHY (ERCP);  Surgeon: Doran Stabler, MD;  Location: [facility];  Service: Endoscopy;  Laterality: N/A;  . ERCP N/A 03/09/2016   Procedure: ENDOSCOPIC RETROGRADE CHOLANGIOPANCREATOGRAPHY (ERCP);  Surgeon: Ladene Artist, MD;  Location: [facility];  Service: Endoscopy;  Laterality: N/A;  . RML removed Dr Ricard Dillon for lung cancer  08/28/2008   T2N0 squamous cell CA  . TONSILLECTOMY AND ADENOIDECTOMY    . u/s guided aspiration of R breast cyst   2009   at the breast clinic     Home Medications:  Prior to Admission medications   Medication Sig Start Date End Date Taking? Authorizing Provider  acetaminophen (TYLENOL) 500 MG tablet Take 500 mg by mouth every 6 (six) hours as needed (for pain).     [provider]  albuterol (PROVENTIL) (2.5 MG/3ML) 0.083% nebulizer solution Take 3 mLs (2.5 mg total) by nebulization every 4 (four) hours as needed for wheezing or shortness of breath. 12/01/18   Noe Gens, PA-C  ALPRAZolam Duanne Moron) 0.25 MG tablet TAKE 1-2 TABLETS (0.25MG  TO 0.5MG  TOTAL) BY MOUTH TWICE DAILY AS NEEDED 11/18/18   Hali Marry, MD  amiodarone (PACERONE) 200 MG tablet TAKE 1 TABLET BY MOUTH EVERY DAY 06/07/18   Lelon Perla, MD  amLODipine (NORVASC) 5 MG tablet Take 1 tablet (5 mg total) by mouth daily. 05/10/18   Lelon Perla, MD  amoxicillin-clavulanate (AUGMENTIN) 875-125 MG tablet Take 1 tablet by mouth 2 (two) times daily. One po bid x 7 days 12/01/18   Noe Gens, PA-C  diclofenac sodium (VOLTAREN) 1 % GEL Apply 2 g topically 4 (four) times daily. To affected joint. 09/26/18   Gregor Hams, MD  Docusate Sodium (STOOL SOFTENER LAXATIVE PO) Take by mouth as needed.     [provider]  furosemide (LASIX) 20 MG tablet Take 1 tablet (20 mg total) by mouth daily. 06/30/18 09/28/18  Lelon Perla, MD  lisinopril (PRINIVIL,ZESTRIL) 40 MG tablet TAKE 1 TABLET BY MOUTH EVERY DAY 11/07/18   Lelon Perla, MD  polyethylene glycol powder (GLYCOLAX/MIRALAX) powder Take 17 g by mouth daily. 08/11/18   Doran Stabler, MD  predniSONE (DELTASONE) 20 MG tablet 3 tabs po day one, then 2 po daily x 4 days 12/01/18   Noe Gens, PA-C  traMADol (ULTRAM) 50 MG tablet TAKE 1 TABLET BY MOUTH THREE TIMES A DAY AS NEEDED FOR PAIN 09/15/18   Hali Marry, MD  XARELTO 20 MG TABS tablet TAKE 1 TABLET (20 MG TOTAL) BY MOUTH DAILY WITH SUPPER. 11/17/18   Lelon Perla, MD    Inpatient Medications: Scheduled Meds:  Continuous Infusions: . sodium chloride     PRN  Meds:   Allergies:    Allergies  Allergen Reactions  . Hyoscyamine Other (See Comments)    Mental status changes.  (as of 02/13/16 patient denies any problems with this medication)  . Rofecoxib Anaphylaxis and Hives  . Statins Other (See Comments)    Muscle aches    Social History:   Social History   Socioeconomic History  . Marital status: Single    Spouse name: Not on file  . Number of children: Not on file  . Years of education: Not on file  . Highest education level: Not on file  Occupational History  . Not on file  Social Needs  . Financial resource strain: Not on file  . Food insecurity:    Worry: Not on file    Inability: Not on file  . Transportation needs:    Medical: Not on file    Non-medical: Not on file  Tobacco Use  . Smoking status: Former Smoker    Years: 40.00  . Smokeless tobacco: Never Used  Substance and Sexual Activity  . Alcohol use: No    Alcohol/week: 0.0 standard drinks  . Drug use: No  . Sexual activity: Not on file  Lifestyle  . Physical activity:    Days per week: Not on file    Minutes per session: Not on file  . Stress: Not on file    Relationships  . Social connections:    Talks on phone: Not on file    Gets together: Not on file    Attends religious service: Not on file    Active member of club or organization: Not on file    Attends meetings of clubs or organizations: Not on file    Relationship status: Not on file  . Intimate partner violence:    Fear of current or ex partner: Not on file    Emotionally abused: Not on file    Physically abused: Not on file    Forced sexual activity: Not on file  Other Topics Concern  . Not on file  Social History Narrative  . Not on file    Family History:   Family History  Problem Relation Age of Onset  . Stroke Father   . Diabetes Son        brittle diabetes  . Colon polyps Neg Hx   . Colon cancer Neg Hx   . Esophageal cancer Neg Hx   . Stomach cancer Neg Hx   . Pancreatic cancer Neg Hx   . Kidney disease Neg Hx   . Liver disease Neg Hx      ROS:  Please see the history of present illness.  All other ROS reviewed and negative.     Physical Exam/Data:   Vitals:   12/05/18 1208 12/05/18 1209  BP: 90/65   Pulse: (!) 105   Resp: 18   Temp: 98.3 F (36.8 C)   TempSrc: Oral   SpO2: 95%   Weight:  77.1 kg  Height:  5\' 6"  (1.676 m)   No intake or output data in the 24 hours ending 12/05/18 1417 Filed Weights   12/05/18 1209  Weight: 77.1 kg   Body mass index is 27.44 kg/m.  General:  Well nourished, well developed, in no acute distress HEENT: normal Lymph: no adenopathy Neck: no JVD Endocrine:  No thryomegaly Vascular: No carotid bruits Cardiac:  RRR with extrasystoles; 1/6 SM, no gallops or rubs Lungs:  Clear with slight mid-end exp wheezes b/l, no rhonchi or  rales  Abd: soft, nontender Ext: no edema Musculoskeletal:  No deformities, age appropriate atrophy Skin: warm and dry  Neuro:  No gross focal abnormalities noted Psych:  Normal affect   EKG:  The EKG was personally reviewed and demonstrates:    AFib 127bpm, post termination pause  2.4seconds >> 2 sinus beats >> AF  Telemetry:  Telemetry was personally reviewed and demonstrates:   AFib 130's >< SR, she is more recently maintaining SR for longer durations 60's, she has post termination pauses as long as 2.6 seconds  Relevant CV Studies:  03/12/16: TTE Study Conclusions - Left ventricle: The cavity size was normal. Wall thickness was   normal. Systolic function was normal. The estimated ejection   fraction was in the range of 60% to 65%. Wall motion was normal;   there were no regional wall motion abnormalities. Features are   consistent with a pseudonormal left ventricular filling pattern,   with concomitant abnormal relaxation and increased filling   pressure (grade 2 diastolic dysfunction). - Aortic valve: Moderately calcified annulus. Moderately thickened   leaflets. There was very mild stenosis. - Mitral valve: The findings are consistent with mild stenosis.  Laboratory Data:  Chemistry Recent Labs  Lab 12/05/18 1227  NA 131*  K 3.8  CL 97*  CO2 27  GLUCOSE 90  BUN 16  CREATININE 1.33*  CALCIUM 8.8*  GFRNONAA 37*  GFRAA 43*  ANIONGAP 7    No results for input(s): PROT, ALBUMIN, AST, ALT, ALKPHOS, BILITOT in the last 168 hours. Hematology Recent Labs  Lab 12/05/18 1227  WBC 18.1*  RBC 4.58  HGB 13.4  HCT 41.6  MCV 90.8  MCH 29.3  MCHC 32.2  RDW 13.0  PLT 388   Cardiac EnzymesNo results for input(s): TROPONINI in the last 168 hours.  Recent Labs  Lab 12/05/18 1234  TROPIPOC 0.04    BNPNo results for input(s): BNP, PROBNP in the last 168 hours.  DDimer No results for input(s): DDIMER in the last 168 hours.  Radiology/Studies:   Dg Chest 2 View Result Date: 12/01/2018 CLINICAL DATA:  Patient with cough. EXAM: CHEST - 2 VIEW COMPARISON:  Chest radiograph 02/23/2018 FINDINGS: Stable cardiac and mediastinal contours status post median sternotomy. Aortic atherosclerosis. Minimal heterogeneous opacities left lung base. Right mid lung  scarring. No pleural effusion or pneumothorax. Pulmonary hyperinflation. Thoracic spine degenerative changes. IMPRESSION: Minimal heterogeneous opacities left lung base may represent atelectasis or infection. Followup PA and lateral chest X-ray is recommended in 3-4 weeks following trial of antibiotic therapy to ensure resolution and exclude underlying malignancy. Electronically Signed   By: Lovey Newcomer M.D.   On: 12/01/2018 16:49    Dg Chest Portable 1 View Result Date: 12/05/2018 CLINICAL DATA:  84 year old female with history of generalized weakness. Recent history of pneumonia. Patient is in atrial fibrillation. EXAM: PORTABLE CHEST 1 VIEW COMPARISON:  Chest x-ray 12/01/2018. FINDINGS: Suture line in the right mid lung, presumably from prior right middle lobectomy. Lung volumes are normal. No consolidative airspace disease. No pleural effusions. No pneumothorax. No pulmonary nodule or mass noted. Pulmonary vasculature and the cardiomediastinal silhouette are within normal limits. Atherosclerosis in the thoracic aorta. Status post median sternotomy for aortic valve replacement. IMPRESSION: 1. No radiographic evidence of acute cardiopulmonary disease. 2. Aortic atherosclerosis. Electronically Signed   By: [name] M.D.   On: 12/05/2018 12:54    Assessment and Plan:   1. Tachy-brady syndrome     She reports very fleeting episode of lightheaded/dizziness  that she has not given much thought or worry about, no near syncope or syncope      She has known h/o bradycardia requiring stopping of her BB, as well as AF w/RVR, and post termination pauses      Recommend PPM      Dr. Lovena Le has seen and examined the patient, discussed rational for PPM implant recommendation, the procedure, it's potential risks and benefits.  She is agreeable to proceed     Had a boost drink about 0800, nothing since        2. Paroxysmal AFib     Symptomatic with RVR aware of her heart racing/chest burning      CHA2DS2Vasc is 5, on xarelto, reports excellent compliance     Cal crCl by today's lab is 40, her baseline Creat looks about 1.0 and would be appropriate for 20mg  dose.     She is likely a little dry with recent pneumonia, and RVR acutely as well as less then usual intake     Follow BMET in AM  3. Leukocytosis     She is afebrile without symptoms of illness, no ongoing symptoms of pneumonia     12/01/2018 started on prednisone dose pack, and nebs for suspect pneumonia, steroids likely etiology of her WBC     Dr. Lovena Le is aware, OK to proceed with PPM       For questions or updates, please contact [facility] Please consult [email or web address] for contact info under     Signed, Baldwin Jamaica, PA-C  12/05/2018 2:17 PM  EP Attending  Patient seen and examined. Agree with above. The patient presents with symptomatic atrial fib with an RVR and periods of sinus bradycardia with post-termination pauses of almost 3 seconds and RVR in the 130-140 range associated with palpitations and sob. She has not had frank syncope but has fleeting episodes where she feels as if she might pass out. She is on maximal medical therapy with amiodarone 200 mg daily for rhythm control. Her exam reveals a pleasant elderly woman, NAD with IRIR rhythm and no edema. No edema. Neuro is non-focal.  A/P 1. Sinus node dysfunction - I have discussed the indications/risks/benefits/goals/expectations of PPM insertion and she is willing to proceed. I discussed the case with Dr. London Sheer who is her primary cardiologist. 2. PAF - she has been maintained in NSR on amiodarone with some breakthrough episodes.   Mikle Bosworth.D.

## 2018-12-05 NOTE — Telephone Encounter (Signed)
Spoke with patient of Dr. Stanford Breed who spoke with Gae Bon NP yesterday. She reports high HR AF and low oxygen levels (70s). She is audibly SOB on the phone. She was treated for PNA on 1/2. Patient reports her HR will sustain high for several hours at a time over the past few days. She reports her O2 was low this AM and will sometimes not pick up - could be d/t HR/Afib or actual low reading. Advised that patient seek ED eval or UC (such as [facility]) eval for acute symptoms as high HR AF and low O2 cannot be managed in an office setting as well as in an ED. She voiced understanding and agreed w/plan.

## 2018-12-05 NOTE — Telephone Encounter (Signed)
New message   Pt c/o medication issue:  1. Name of Medication: metoprolol 25 mg   2. How are you currently taking this medication (dosage and times per day)? 1 time daily  3. Are you having a reaction (difficulty breathing--STAT)? No   4. What is your medication issue? Patient wants to know if she can increase the medication? Patient states that hr is 155 at this time. Oxygen level is 70

## 2018-12-05 NOTE — ED Provider Notes (Addendum)
[name]    CSN: [id number] Arrival date & time: 12/05/18  1022     History   Chief Complaint Chief Complaint  Patient presents with  . Tachycardia    HPI ADALIDA GARVER is a 84 y.o. female.   HPI Daughter-in-law brings her into [facility] urgent care.  Was seen here Thursday for left lower lobe pneumonia.  Complains of worsening palpitations and lightheadedness and shortness of breath.  Longstanding history of atrial fib, cardioverted in the past.  Dr. Stanford Breed is her cardiologist.  Patient mentions that metoprolol 25 mg daily was added by Dr.  Stanford Breed about 10 days ago, but not noted in epic EMR. Past Medical History:  Diagnosis Date  . AAA (abdominal aortic aneurysm) ([place])   . Anemia    iron deficiency  . Ascending aorta dilatation (HCC)   . Atrial fibrillation ([place])    post op  . CAD (coronary artery disease)   . Carcinoma of lung ([place]) 08/28/2008   T2N0 moderately diff. squamous cell CA  . Cerebrovascular disease   . COPD (chronic obstructive pulmonary disease) ([place]) 2005   on CXR with R apical scaring  . Headache(784.0)    Hx: of migraines in the past  . Hypercholesterolemia    Hx: of  . Hypertension   . Incidental pulmonary nodule, > 30mm and < 68mm    LLL  . S/P aortic valve replacement 08/28/2008   [facility]  . S/P lobectomy of lung 08/28/2008   rt middle lobe  dr. Roxy Manns  . Shingles 11-09    Patient Active Problem List   Diagnosis Date Noted  . Low grade squamous intraepithelial lesion (LGSIL) on cervicovaginal cytologic smear 04/08/2018  . Paroxysmal atrial fibrillation ([place]) 02/25/2018  . Sinus bradycardia 02/25/2018  . Incidental pulmonary nodule, > 55mm and < 96mm   . Claudication ([place]) 04/22/2016  . Dyspnea   . Atrial fibrillation with RVR ([place])   . COPD (chronic obstructive pulmonary disease) ([place])   . RUQ abdominal pain   . Biliary calculus   . Pulmonary emphysema ([place])   . Aortic valve disease   . Thoracic aortic aneurysm  without rupture ([place]) 08/19/2015  . Ascending aorta dilatation (HCC)   . Abdominal aortic aneurysm ([place]) 08/26/2011  . HYPERGLYCEMIA, MILD 11/10/2010  . OSTEOPENIA 09/29/2010  . Coronary atherosclerosis 05/15/2009  . Bilateral carotid artery disease ([place]) 05/15/2009  . TIA 05/15/2009  . ILIAC ARTERY ANEURYSM 05/15/2009  . GAD (generalized anxiety disorder) 03/20/2009  . THYROID NODULE 01/09/2009  . THYROMEGALY 01/08/2009  . SIALOLITHIASIS 01/08/2009  . MALIGNANT NEOPLASM MIDDLE LOBE BRONCHUS OR LUNG 10/24/2008  . HLD (hyperlipidemia) 10/24/2008  . AORTIC VALVE REPLACEMENT, HX OF 10/24/2008  . S/P aortic valve replacement 08/28/2008  . DIVERTICULOSIS OF COLON 06/19/2008  . ANEMIA, IRON DEFICIENCY 06/06/2008  . Essential hypertension, benign 06/06/2008  . GASTROINTESTINAL HEMORRHAGE, HX OF 06/06/2008    Past Surgical History:  Procedure Laterality Date  . ABDOMINAL SURGERY    . AORTIC VALVE REPLACEMENT  08/28/2008   #[facility] Ease pericardial tissue valve  . APPENDECTOMY    . CARDIAC CATHETERIZATION    . CATARACT EXTRACTION W/ INTRAOCULAR LENS  IMPLANT, BILATERAL Bilateral   . CHOLECYSTECTOMY N/A 12/13/2013   Procedure: LAPAROSCOPIC CHOLECYSTECTOMY;  Surgeon: Ralene Ok, MD;  Location: [facility];  Service: General;  Laterality: N/A;  . COLONOSCOPY     \  . DILATION AND CURETTAGE OF UTERUS    . ERCP N/A 05/08/2015   Procedure: ENDOSCOPIC  RETROGRADE CHOLANGIOPANCREATOGRAPHY (ERCP);  Surgeon: Inda Castle, MD;  Location: [facility];  Service: Endoscopy;  Laterality: N/A;  . ERCP N/A 01/13/2016   Procedure: ENDOSCOPIC RETROGRADE CHOLANGIOPANCREATOGRAPHY (ERCP);  Surgeon: Doran Stabler, MD;  Location: [facility];  Service: Endoscopy;  Laterality: N/A;  . ERCP N/A 03/09/2016   Procedure: ENDOSCOPIC RETROGRADE CHOLANGIOPANCREATOGRAPHY (ERCP);  Surgeon: Ladene Artist, MD;  Location: [facility];  Service: Endoscopy;  Laterality: N/A;  . RML removed Dr Ricard Dillon for  lung cancer  08/28/2008   T2N0 squamous cell CA  . TONSILLECTOMY AND ADENOIDECTOMY    . u/s guided aspiration of R breast cyst   2009   at the breast clinic    OB History    Gravida  2   Para  2   Term      Preterm      AB      Living        SAB      TAB      Ectopic      Multiple      Live Births  2            Home Medications    Prior to Admission medications   Medication Sig Start Date End Date Taking? Authorizing Provider  acetaminophen (TYLENOL) 500 MG tablet Take 500 mg by mouth every 6 (six) hours as needed (for pain).     [provider]  albuterol (PROVENTIL) (2.5 MG/3ML) 0.083% nebulizer solution Take 3 mLs (2.5 mg total) by nebulization every 4 (four) hours as needed for wheezing or shortness of breath. 12/01/18   Noe Gens, PA-C  ALPRAZolam Duanne Moron) 0.25 MG tablet TAKE 1-2 TABLETS (0.25MG  TO 0.5MG  TOTAL) BY MOUTH TWICE DAILY AS NEEDED 11/18/18   Hali Marry, MD  amiodarone (PACERONE) 200 MG tablet TAKE 1 TABLET BY MOUTH EVERY DAY 06/07/18   Lelon Perla, MD  amLODipine (NORVASC) 5 MG tablet Take 1 tablet (5 mg total) by mouth daily. 05/10/18   Lelon Perla, MD  amoxicillin-clavulanate (AUGMENTIN) 875-125 MG tablet Take 1 tablet by mouth 2 (two) times daily. One po bid x 7 days 12/01/18   Noe Gens, PA-C  diclofenac sodium (VOLTAREN) 1 % GEL Apply 2 g topically 4 (four) times daily. To affected joint. 09/26/18   Gregor Hams, MD  Docusate Sodium (STOOL SOFTENER LAXATIVE PO) Take by mouth as needed.    [provider]  furosemide (LASIX) 20 MG tablet Take 1 tablet (20 mg total) by mouth daily. 06/30/18 09/28/18  Lelon Perla, MD  lisinopril (PRINIVIL,ZESTRIL) 40 MG tablet TAKE 1 TABLET BY MOUTH EVERY DAY 11/07/18   Lelon Perla, MD  polyethylene glycol powder (GLYCOLAX/MIRALAX) powder Take 17 g by mouth daily. 08/11/18   Doran Stabler, MD  predniSONE (DELTASONE) 20 MG tablet 3 tabs po day one, then 2 po  daily x 4 days 12/01/18   Noe Gens, PA-C  traMADol (ULTRAM) 50 MG tablet TAKE 1 TABLET BY MOUTH THREE TIMES A DAY AS NEEDED FOR PAIN 09/15/18   Hali Marry, MD  XARELTO 20 MG TABS tablet TAKE 1 TABLET (20 MG TOTAL) BY MOUTH DAILY WITH SUPPER. 11/17/18   Stanford Breed Denice Bors, MD    Family History Family History  Problem Relation Age of Onset  . Stroke Father   . Diabetes Son        brittle diabetes  . Colon polyps Neg Hx   .  Colon cancer Neg Hx   . Esophageal cancer Neg Hx   . Stomach cancer Neg Hx   . Pancreatic cancer Neg Hx   . Kidney disease Neg Hx   . Liver disease Neg Hx     Social History Social History   Tobacco Use  . Smoking status: Former Smoker    Years: 40.00  . Smokeless tobacco: Never Used  Substance Use Topics  . Alcohol use: No    Alcohol/week: 0.0 standard drinks  . Drug use: No   Former smoker  Allergies   Hyoscyamine; Rofecoxib; and Statins   Review of Systems Review of Systems  Constitutional: Positive for fatigue and fever.  Respiratory: Positive for cough and shortness of breath.   Cardiovascular: Positive for palpitations.  Gastrointestinal: Negative for abdominal pain.  Skin: Negative for rash.  Psychiatric/Behavioral: Negative for confusion.  Pertinent items noted in HPI and remainder of comprehensive ROS otherwise negative for any new symptoms    Physical Exam Triage Vital Signs ED Triage Vitals [12/05/18 1049]  Enc Vitals Group     BP (!) 88/63     Pulse Rate (!) 129     Resp 20     Temp      Temp src      SpO2 97 %     Weight      Height      Head Circumference      Peak Flow      Pain Score 0     Pain Loc      Pain Edu?      Excl. in [place]?    No data found.  Updated Vital Signs BP (!) 88/63 (BP Location: Right Arm)   Pulse (!) 129   Resp 20   SpO2 97%  Pulse ox varies 91-96.  Physical Exam Vitals signs and nursing note reviewed.  Constitutional:      Comments: Dyspneic, acutely ill-appearing female.   Somewhat confused but she responds to questions.  HENT:     Mouth/Throat:     Mouth: Mucous membranes are moist.  Eyes:     General: No scleral icterus.    Pupils: Pupils are equal, round, and reactive to light.  Neck:     Musculoskeletal: No neck rigidity.  Cardiovascular:     Rate and Rhythm: Tachycardia present. Rhythm irregular.     Pulses: Normal pulses.  Pulmonary:     Comments: Mild crackles left base.  O2 saturation varies 90-96.  On room air Neurological:     Mental Status: She is alert.    Dyspneic Rales present left base Heart: Irregularly irregular, variable rate between 60 and 130.   UC Treatments / Results  Labs (all labs ordered are listed, but only abnormal results are displayed) Labs Reviewed - No data to display  EKG None EKG here in urgent care shows atrial fib with rapid ventricular response.    Then on EMS monitor, variable rate between 60 and 130. Radiology No results found.  Procedures Procedures (including critical care time)  Medications Ordered in UC Medications - No data to display  Initial Impression / Assessment and Plan / UC Course  I have reviewed the triage vital signs and the nursing notes.  Pertinent labs & imaging results that were available during my care of the patient were reviewed by me and considered in my medical decision making (see chart for details).      Final Clinical Impressions(s) / UC Diagnoses   Final diagnoses:  Atrial fibrillation with rapid ventricular response (HCC)  Dyspnea, unspecified type  Community acquired pneumonia of left lower lobe of lung (HCC)  Hypotension, unspecified hypotension type  And recent diagnosis left lower lobe pneumonia. Hypotension. Patient needs evaluation and treatment at emergency room and see cardiologist at, because of intermittent atrial fib.  Rapid ventricular response.   EMS called and arrived, started IV, patient to be transported to [facility] emergency room stat for further  evaluation and treatment.  Dr. Stanford Breed is her cardiologist.  Jacqulyn Cane, MD Urgent Care Physician [facility]    Jacqulyn Cane, MD 12/05/18 [place]    Jacqulyn Cane, MD 12/05/18 1128

## 2018-12-06 ENCOUNTER — Encounter (HOSPITAL_COMMUNITY): Payer: Self-pay | Admitting: Internal Medicine

## 2018-12-06 ENCOUNTER — Ambulatory Visit (HOSPITAL_COMMUNITY): Payer: Medicare HMO

## 2018-12-06 DIAGNOSIS — E041 Nontoxic single thyroid nodule: Secondary | ICD-10-CM | POA: Diagnosis not present

## 2018-12-06 DIAGNOSIS — Z87891 Personal history of nicotine dependence: Secondary | ICD-10-CM | POA: Diagnosis not present

## 2018-12-06 DIAGNOSIS — I10 Essential (primary) hypertension: Secondary | ICD-10-CM | POA: Diagnosis not present

## 2018-12-06 DIAGNOSIS — I495 Sick sinus syndrome: Secondary | ICD-10-CM | POA: Diagnosis not present

## 2018-12-06 DIAGNOSIS — J9811 Atelectasis: Secondary | ICD-10-CM | POA: Diagnosis not present

## 2018-12-06 DIAGNOSIS — E785 Hyperlipidemia, unspecified: Secondary | ICD-10-CM | POA: Diagnosis not present

## 2018-12-06 DIAGNOSIS — I480 Paroxysmal atrial fibrillation: Secondary | ICD-10-CM | POA: Diagnosis not present

## 2018-12-06 DIAGNOSIS — D509 Iron deficiency anemia, unspecified: Secondary | ICD-10-CM | POA: Diagnosis not present

## 2018-12-06 DIAGNOSIS — E7800 Pure hypercholesterolemia, unspecified: Secondary | ICD-10-CM | POA: Diagnosis not present

## 2018-12-06 DIAGNOSIS — D72829 Elevated white blood cell count, unspecified: Secondary | ICD-10-CM | POA: Diagnosis not present

## 2018-12-06 DIAGNOSIS — Z950 Presence of cardiac pacemaker: Secondary | ICD-10-CM | POA: Diagnosis not present

## 2018-12-06 LAB — BASIC METABOLIC PANEL
Anion gap: 8 (ref 5–15)
BUN: 15 mg/dL (ref 8–23)
CO2: 25 mmol/L (ref 22–32)
Calcium: 8.4 mg/dL — ABNORMAL LOW (ref 8.9–10.3)
Chloride: 97 mmol/L — ABNORMAL LOW (ref 98–111)
Creatinine, Ser: 1.06 mg/dL — ABNORMAL HIGH (ref 0.44–1.00)
GFR calc Af Amer: 57 mL/min — ABNORMAL LOW (ref >60)
GFR calc non Af Amer: 49 mL/min — ABNORMAL LOW (ref >60)
Glucose, Bld: 91 mg/dL (ref 70–99)
Potassium: 4.1 mmol/L (ref 3.5–5.1)
Sodium: 130 mmol/L — ABNORMAL LOW (ref 135–145)

## 2018-12-06 MED ORDER — ALPRAZOLAM 0.25 MG PO TABS
0.2500 mg | ORAL_TABLET | Freq: Two times a day (BID) | ORAL | Status: DC | PRN
  Administered 2018-12-06: 0.25 mg via ORAL
  Filled 2018-12-06 (×2): qty 1

## 2018-12-06 MED ORDER — METOPROLOL TARTRATE 50 MG PO TABS: 25.0000 mg | ORAL_TABLET | Freq: Two times a day (BID) | ORAL | 6 refills | Status: DC

## 2018-12-06 NOTE — Discharge Summary (Addendum)
ELECTROPHYSIOLOGY PROCEDURE DISCHARGE SUMMARY    Patient ID: Laurie [name],  MRN: [id number], DOB/AGE: 03-23-1935 84 y.o.  Admit date: 12/05/2018 Discharge date: 12/06/2018  Primary Care Physician: Hali Marry, MD  Primary Cardiologist: Dr. Stanford Breed Electrophysiologist: new, Dr. Lovena Le  Primary Discharge Diagnosis:  1. AFib w/RVR 2. Tachy-brady syndrome  Secondary Discharge Diagnosis:  1. VHD      Remote bioprosthetic AVR     Also h/o septal myomectomy 2. Lung Ca     Remote RMLobectomy 3. COPD 4. Recent pneumonia 5. HTN 6. PVD   Allergies  Allergen Reactions  . Hyoscyamine Other (See Comments)    Mental status changes.  (as of 02/13/16 patient denies any problems with this medication)  . Rofecoxib Anaphylaxis and Hives  . Statins Other (See Comments)    Muscle aches     Procedures This Admission:  1.  Implantation of a SJM dual chamber PPM on 12/05/18 by Dr Lovena Le.  The patient received a St. Jude (serial number [id number]) pacemaker, [employer] Jude (serial number [id number]) right atrial lead and a [employer]. Jude (serial number [id number]) right ventricular lead  There were no immediate post procedure complications. 2.  CXR on 12/06/18 demonstrated no pneumothorax status post device implantation.   Brief HPI: Laurie [name] is a 84 y.o. female was admitted via the ER yesterday with symptomatic AFib w/RVR and observed post termination pauses, known h/o bradycardia requiring discontinuation of her BB in the past. Recommended for PPM for tachy-brady.   Hospital Course:  The patient's PMHx is noted above.  Recently she sought care at an [facility] c/o cough, chest congestion, fatigue, poor appetite and chest tightness, CXR suggested infection vs atelectasis, BP was 105/66, afebrile, HR 79, and RA sat 90%, treated with duoneb, solumedrol and dose of Rocefin with improvement in SOB, sats hanging 89-90% and recommended ER for further evaluation/management though she declined and  Rx prednisone dose pack, albuterol neb, and Augmentin.  She subsequently noted her HR elevated suspected to be 2/2 new meds, she had a week prior resumed her prior metoprolol 25mg  daily with some fluctuation in her pulse.   She developed fast heart rate, knew she was in AFib again, went to be hoping would resolve, woke with ongoing symptoms and came in.  Initially her BP soft 88-90's though after IVF bolus this resolved.  poc Trop was negative, labs noted for WBC of 18.1  The patient was afebrile, no ongoing symptoms of illness, did not appear acutely ill in any way, and this was felt most likely 2/2 to her prednisone dose pack she was taking/nebs.  She has remained afebrile.  The patient was noted to have periods of AFib with RVR to 130's and SR 60's, with post termination pauses of >2.5seconds.  She has history of bradycardia requiring BB to be stopped historically and maintained chronically with amiodarone alone.  She is felt to have tachy-brady, Dr. Lovena Le recommended pacing, the patient was agreeable.  She was admitted and underwent implantation of a PPM with details as outlined above.  She was monitored on telemetry overnight which demonstrated SR, A paced rhythm, no further AF.  She was given metoprolol 5mg  IV post pacer implant, quieted her AF down nicely and will be resumed on Lopressor at 25mg  BID dosing.   Left chest was without hematoma or ecchymosis.  The device was interrogated and found to be functioning normally.  CXR was obtained and demonstrated no pneumothorax status post device implantation, also no infiltrate.  Wound care, arm mobility, and restrictions were reviewed with the patient.  The patient feels well this morning, some implant mild-mod site discomfort, discussed Tylenol per bottle directions, no CP or SOB, she was examined by Dr. Lovena Le and considered stable for discharge to home.   The patient was instructed to resume her prednisone dose pack and complete it as originally  instructed as well as her Augmentin antibiotic and not to resume her xarelto until Thursday 12/08/2018 evening.  BMET this AM with Creat back to her baseline, 1.06, with a Calc CreatCl of 52, she will remain on 20mg  daily dose   Physical Exam: Vitals:   12/05/18 2002 12/05/18 2350 12/06/18 0257 12/06/18 0452  BP: 115/63 139/70  (!) 152/78  Pulse: 63 66  73  Resp:  20    Temp:  98.1 F (36.7 C)  98.6 F (37 C)  TempSrc:  Oral  Oral  SpO2:  91%  95%  Weight:   78.8 kg   Height:        GEN- The patient is well appearing, alert and oriented x 3 today.   HEENT: normocephalic, atraumatic; sclera clear, conjunctiva pink; hearing intact; oropharynx clear; neck supple, no JVP Lungs- CTA, normal work of breathing.  No wheezes, rales, rhonchi Heart- Regular rate and rhythm, no murmurs, rubs or gallops, PMI not laterally displaced GI- soft, non-tender, non-distended Extremities- no clubbing, cyanosis, or edema MS- no significant deformity or atrophy Skin- warm and dry, no rash or lesion, left chest without hematoma/ecchymosis Psych- euthymic mood, full affect Neuro- no gross deficits   Labs:   Lab Results  Component Value Date   WBC 18.1 (H) 12/05/2018   HGB 13.4 12/05/2018   HCT 41.6 12/05/2018   MCV 90.8 12/05/2018   PLT 388 12/05/2018    Recent Labs  Lab 12/06/18 0503  NA 130*  K 4.1  CL 97*  CO2 25  BUN 15  CREATININE 1.06*  CALCIUM 8.4*  GLUCOSE 91    Discharge Medications:  Allergies as of 12/06/2018      Reactions   Hyoscyamine Other (See Comments)   Mental status changes.  (as of 02/13/16 patient denies any problems with this medication)   Rofecoxib Anaphylaxis, Hives   Statins Other (See Comments)   Muscle aches      Medication List    TAKE these medications   acetaminophen 500 MG tablet Commonly known as:  TYLENOL Take 500 mg by mouth daily as needed (pain).   albuterol (2.5 MG/3ML) 0.083% nebulizer solution Commonly known as:  PROVENTIL Take 3 mLs  (2.5 mg total) by nebulization every 4 (four) hours as needed for wheezing or shortness of breath.   ALPRAZolam 0.25 MG tablet Commonly known as:  XANAX TAKE 1-2 TABLETS (0.25MG  TO 0.5MG  TOTAL) BY MOUTH TWICE DAILY AS NEEDED What changed:    how much to take  how to take this  when to take this  reasons to take this  additional instructions   amiodarone 200 MG tablet Commonly known as:  PACERONE TAKE 1 TABLET BY MOUTH EVERY DAY What changed:  when to take this   amLODipine 5 MG tablet Commonly known as:  NORVASC Take 1 tablet (5 mg total) by mouth daily. What changed:  when to take this   amoxicillin-clavulanate 875-125 MG tablet Commonly known as:  AUGMENTIN Take 1 tablet by mouth 2 (two) times daily. One po bid x 7 days What changed:  additional instructions Notes to patient:  Resume and complete  this prescription as originally written   b complex vitamins tablet Take 1 tablet by mouth daily with supper.   diclofenac sodium 1 % Gel Commonly known as:  VOLTAREN Apply 2 g topically 4 (four) times daily. To affected joint.   furosemide 20 MG tablet Commonly known as:  LASIX Take 1 tablet (20 mg total) by mouth daily.   lactose free nutrition Liqd Take 237 mLs by mouth daily.   lisinopril 40 MG tablet Commonly known as:  PRINIVIL,ZESTRIL TAKE 1 TABLET BY MOUTH EVERY DAY What changed:  when to take this   metoprolol tartrate 50 MG tablet Commonly known as:  LOPRESSOR Take 0.5 tablets (25 mg total) by mouth 2 (two) times daily. What changed:  when to take this   polyethylene glycol powder powder Commonly known as:  GLYCOLAX/MIRALAX Take 17 g by mouth daily. What changed:    when to take this  reasons to take this   predniSONE 10 MG tablet Commonly known as:  DELTASONE Take 40-60 mg by mouth See admin instructions. Started 12/04/18- take 6 tablets (60 mg) by mouth 1st day, then take 4 tablets (40 mg) daily for four days. Notes to patient:  Resume and  complete this prescription as was originally written   STOOL SOFTENER LAXATIVE PO Take 1 capsule by mouth at bedtime as needed (constipation). Notes to patient:  Resume and complete this prescription as originally written   traMADol 50 MG tablet Commonly known as:  ULTRAM TAKE 1 TABLET BY MOUTH THREE TIMES A DAY AS NEEDED FOR PAIN What changed:  See the new instructions.   XARELTO 20 MG Tabs tablet Generic drug:  rivaroxaban TAKE 1 TABLET (20 MG TOTAL) BY MOUTH DAILY WITH SUPPER. What changed:  See the new instructions. Notes to patient:  Do not resume until Thursday 12/08/2018 evening       Disposition:  Home Discharge Instructions    Diet - low sodium heart healthy   Complete by:  As directed    Increase activity slowly   Complete by:  As directed      Follow-up Information    Hali Marry, MD.   Specialty:  Family Medicine Contact information: [address] [place] [address] [phone]        [facility] Follow up.   Specialty:  Cardiology Why:  12/21/2018 @ 11:30AM, wound check visit Contact information: [address] [place]       Evans Lance, MD Follow up.   Specialty:  Cardiology Why:  03/08/2019 @ 1:45PM Contact information: [address] [address] [phone]           Duration of Discharge Encounter: Greater than 30 minutes including physician time.  Venetia Night, PA-C 12/06/2018 9:53 AM  EP Attending  Patient seen and examined. Agree with above. The patient is doing well after PPM insertion. I have asked her to hold her anti-coagulation for 2 days. Her PPM interogation under my direction demonstrates normal DDD PM function. CXR looks good. She will followup with [place] in 1-2 weeks as per usual. She will restart her beta blocker.  Mikle Bosworth.D.

## 2018-12-06 NOTE — ED Provider Notes (Signed)
[facility] Provider Note   CSN: [id number] Arrival date & time: 12/05/18  1201     History   Chief Complaint Chief Complaint  Patient presents with  . Atrial Fibrillation    HPI Laurie [name] is a 84 y.o. female.  HPI Patient is an 84 year old female with a history of paroxysmal atrial fibrillation who presents to the emergency department as a direct referral from urgent care where she was found to be in A. fib with RVR.  She states last week she had upper respiratory symptoms and was with antibiotics and steroids as well as bronchodilators for a pneumonia.  She feels like her respiratory symptoms are improving but today reported increasing palpitations shortness of breath and lightheadedness without syncope.  No anterior chest discomfort.  No unilateral leg swelling.  Compliant with her medications including her chronic anticoagulation.  Presents to the emergency department with A. fib with RVR with significant electrical pauses.   Past Medical History:  Diagnosis Date  . AAA (abdominal aortic aneurysm) ([place])   . Anemia    iron deficiency  . Ascending aorta dilatation (HCC)   . Atrial fibrillation ([place])    post op  . CAD (coronary artery disease)   . Carcinoma of lung ([place]) 08/28/2008   T2N0 moderately diff. squamous cell CA  . Cerebrovascular disease   . COPD (chronic obstructive pulmonary disease) ([place]) 2005   on CXR with R apical scaring  . Headache(784.0)    Hx: of migraines in the past  . Hypercholesterolemia    Hx: of  . Hypertension   . Incidental pulmonary nodule, > 28mm and < 51mm    LLL  . S/P aortic valve replacement 08/28/2008   [facility]  . S/P lobectomy of lung 08/28/2008   rt middle lobe  dr. Roxy Manns  . Shingles 11-09    Patient Active Problem List   Diagnosis Date Noted  . Tachy-brady syndrome ([place]) 12/05/2018  . Low grade squamous intraepithelial lesion (LGSIL) on cervicovaginal cytologic smear 04/08/2018  . Paroxysmal  atrial fibrillation ([name]) 02/25/2018  . Sinus bradycardia 02/25/2018  . Incidental pulmonary nodule, > 41mm and < 23mm   . Claudication ([place]) 04/22/2016  . Dyspnea   . Atrial fibrillation with RVR ([place])   . COPD (chronic obstructive pulmonary disease) ([place])   . RUQ abdominal pain   . Biliary calculus   . Pulmonary emphysema ([place])   . Aortic valve disease   . Thoracic aortic aneurysm without rupture ([place]) 08/19/2015  . Ascending aorta dilatation (HCC)   . Abdominal aortic aneurysm ([place]) 08/26/2011  . HYPERGLYCEMIA, MILD 11/10/2010  . OSTEOPENIA 09/29/2010  . Coronary atherosclerosis 05/15/2009  . Bilateral carotid artery disease ([place]) 05/15/2009  . TIA 05/15/2009  . ILIAC ARTERY ANEURYSM 05/15/2009  . GAD (generalized anxiety disorder) 03/20/2009  . THYROID NODULE 01/09/2009  . THYROMEGALY 01/08/2009  . SIALOLITHIASIS 01/08/2009  . MALIGNANT NEOPLASM MIDDLE LOBE BRONCHUS OR LUNG 10/24/2008  . HLD (hyperlipidemia) 10/24/2008  . AORTIC VALVE REPLACEMENT, HX OF 10/24/2008  . S/P aortic valve replacement 08/28/2008  . DIVERTICULOSIS OF COLON 06/19/2008  . ANEMIA, IRON DEFICIENCY 06/06/2008  . Essential hypertension, benign 06/06/2008  . GASTROINTESTINAL HEMORRHAGE, HX OF 06/06/2008    Past Surgical History:  Procedure Laterality Date  . ABDOMINAL SURGERY    . AORTIC VALVE REPLACEMENT  08/28/2008   #86mm [facility] Ease pericardial tissue valve  . APPENDECTOMY    . CARDIAC CATHETERIZATION    . CATARACT EXTRACTION W/  INTRAOCULAR LENS  IMPLANT, BILATERAL Bilateral   . CHOLECYSTECTOMY N/A 12/13/2013   Procedure: LAPAROSCOPIC CHOLECYSTECTOMY;  Surgeon: Ralene Ok, MD;  Location: [facility];  Service: General;  Laterality: N/A;  . COLONOSCOPY     \  . DILATION AND CURETTAGE OF UTERUS    . ERCP N/A 05/08/2015   Procedure: ENDOSCOPIC RETROGRADE CHOLANGIOPANCREATOGRAPHY (ERCP);  Surgeon: Inda Castle, MD;  Location: [facility];  Service: Endoscopy;  Laterality: N/A;  . ERCP N/A  01/13/2016   Procedure: ENDOSCOPIC RETROGRADE CHOLANGIOPANCREATOGRAPHY (ERCP);  Surgeon: Doran Stabler, MD;  Location[facility];  Service: Endoscopy;  Laterality: N/A;  . ERCP N/A 03/09/2016   Procedure: ENDOSCOPIC RETROGRADE CHOLANGIOPANCREATOGRAPHY (ERCP);  Surgeon: Ladene Artist, MD;  Location: [facility];  Service: Endoscopy;  Laterality: N/A;  . PACEMAKER IMPLANT N/A 12/05/2018   Procedure: PACEMAKER IMPLANT;  Surgeon: Evans Lance, MD;  Location: [facility];  Service: Cardiovascular;  Laterality: N/A;  . RML removed Dr Ricard Dillon for lung cancer  08/28/2008   T2N0 squamous cell CA  . TONSILLECTOMY AND ADENOIDECTOMY    . u/s guided aspiration of R breast cyst   2009   at the breast clinic     OB History    Gravida  2   Para  2   Term      Preterm      AB      Living        SAB      TAB      Ectopic      Multiple      Live Births  2            Home Medications    Prior to Admission medications   Medication Sig Start Date End Date Taking? Authorizing Provider  acetaminophen (TYLENOL) 500 MG tablet Take 500 mg by mouth daily as needed (pain).    Yes [provider]  albuterol (PROVENTIL) (2.5 MG/3ML) 0.083% nebulizer solution Take 3 mLs (2.5 mg total) by nebulization every 4 (four) hours as needed for wheezing or shortness of breath. 12/01/18  Yes Phelps, Erin O, PA-C  ALPRAZolam (XANAX) 0.25 MG tablet TAKE 1-2 TABLETS (0.25MG  TO 0.5MG  TOTAL) BY MOUTH TWICE DAILY AS NEEDED Patient taking differently: Take 0.25 mg by mouth 2 (two) times daily as needed for anxiety or sleep.  11/18/18  Yes Hali Marry, MD  amiodarone (PACERONE) 200 MG tablet TAKE 1 TABLET BY MOUTH EVERY DAY Patient taking differently: Take 200 mg by mouth daily before breakfast.  06/07/18  Yes Crenshaw, Denice Bors, MD  amLODipine (NORVASC) 5 MG tablet Take 1 tablet (5 mg total) by mouth daily. Patient taking differently: Take 5 mg by mouth daily after supper.  05/10/18   Yes Lelon Perla, MD  amoxicillin-clavulanate (AUGMENTIN) 875-125 MG tablet Take 1 tablet by mouth 2 (two) times daily. One po bid x 7 days Patient taking differently: Take 1 tablet by mouth 2 (two) times daily.  12/01/18  Yes Phelps, Erin O, PA-C  b complex vitamins tablet Take 1 tablet by mouth daily with supper.   Yes [provider]  Docusate Sodium (STOOL SOFTENER LAXATIVE PO) Take 1 capsule by mouth at bedtime as needed (constipation).    Yes [provider]  lactose free nutrition (BOOST) LIQD Take 237 mLs by mouth daily.   Yes [provider]  lisinopril (PRINIVIL,ZESTRIL) 40 MG tablet TAKE 1 TABLET BY MOUTH EVERY DAY Patient taking differently: Take 40 mg  by mouth daily before breakfast.  11/07/18  Yes Crenshaw, Denice Bors, MD  metoprolol tartrate (LOPRESSOR) 50 MG tablet Take 25 mg by mouth daily before breakfast.   Yes [provider]  polyethylene glycol powder (GLYCOLAX/MIRALAX) powder Take 17 g by mouth daily. Patient taking differently: Take 17 g by mouth daily as needed (constipation).  08/11/18  Yes Danis, Kirke Corin, MD  predniSONE (DELTASONE) 10 MG tablet Take 40-60 mg by mouth See admin instructions. Started 12/04/18- take 6 tablets (60 mg) by mouth 1st day, then take 4 tablets (40 mg) daily for four days.   Yes [provider]  traMADol (ULTRAM) 50 MG tablet TAKE 1 TABLET BY MOUTH THREE TIMES A DAY AS NEEDED FOR PAIN Patient taking differently: Take 50 mg by mouth daily as needed for severe pain.  09/15/18  Yes Metheney, Rene Kocher, MD  XARELTO 20 MG TABS tablet TAKE 1 TABLET (20 MG TOTAL) BY MOUTH DAILY WITH SUPPER. Patient taking differently: Take 20 mg by mouth daily after supper. High risk med: Anticoagulant.  Crushed Xarelto can be given down a G-tube but NOT a J-Tube. 11/17/18  Yes Lelon Perla, MD  diclofenac sodium (VOLTAREN) 1 % GEL Apply 2 g topically 4 (four) times daily. To affected joint. Patient not taking: Reported  on 12/05/2018 09/26/18   Gregor Hams, MD  furosemide (LASIX) 20 MG tablet Take 1 tablet (20 mg total) by mouth daily. Patient not taking: Reported on 12/05/2018 06/30/18 02/03/19  Lelon Perla, MD    Family History Family History  Problem Relation Age of Onset  . Stroke Father   . Diabetes Son        brittle diabetes  . Colon polyps Neg Hx   . Colon cancer Neg Hx   . Esophageal cancer Neg Hx   . Stomach cancer Neg Hx   . Pancreatic cancer Neg Hx   . Kidney disease Neg Hx   . Liver disease Neg Hx     Social History Social History   Tobacco Use  . Smoking status: Former Smoker    Years: 40.00  . Smokeless tobacco: Never Used  Substance Use Topics  . Alcohol use: No    Alcohol/week: 0.0 standard drinks  . Drug use: No     Allergies   Hyoscyamine; Rofecoxib; and Statins   Review of Systems Review of Systems  All other systems reviewed and are negative.    Physical Exam Updated Vital Signs BP (!) 152/78 (BP Location: Right Arm)   Pulse 73   Temp 98.6 F (37 C) (Oral)   Resp 20   Ht 5\' 6"  (1.676 m)   Wt 78.8 kg   SpO2 95%   BMI 28.04 kg/m   Physical Exam Vitals signs and nursing note reviewed.  Constitutional:      General: She is not in acute distress.    Appearance: She is well-developed.  HENT:     Head: Normocephalic and atraumatic.  Neck:     Musculoskeletal: Normal range of motion.  Cardiovascular:     Rate and Rhythm: Tachycardia present. Rhythm irregular.     Heart sounds: Normal heart sounds.  Pulmonary:     Effort: Pulmonary effort is normal.     Breath sounds: Normal breath sounds.  Abdominal:     General: There is no distension.     Palpations: Abdomen is soft.     Tenderness: There is no abdominal tenderness.  Musculoskeletal: Normal range of motion.  Skin:  General: Skin is warm and dry.  Neurological:     Mental Status: She is alert and oriented to person, place, and time.  Psychiatric:        Judgment: Judgment normal.       ED Treatments / Results  Labs (all labs ordered are listed, but only abnormal results are displayed) Labs Reviewed  CBC - Abnormal; Notable for the following components:      Result Value   WBC 18.1 (*)    All other components within normal limits  BASIC METABOLIC PANEL - Abnormal; Notable for the following components:   Sodium 131 (*)    Chloride 97 (*)    Creatinine, Ser 1.33 (*)    Calcium 8.8 (*)    GFR calc non Af Amer 37 (*)    GFR calc Af Amer 43 (*)    All other components within normal limits  BASIC METABOLIC PANEL - Abnormal; Notable for the following components:   Sodium 130 (*)    Chloride 97 (*)    Creatinine, Ser 1.06 (*)    Calcium 8.4 (*)    GFR calc non Af Amer 49 (*)    GFR calc Af Amer 57 (*)    All other components within normal limits  CULTURE, BLOOD (ROUTINE X 2)  CULTURE, BLOOD (ROUTINE X 2)  MAGNESIUM  PHOSPHORUS  I-STAT TROPONIN, ED    EKG EKG Interpretation  Date/Time:  Monday December 05 2018 12:06:02 EST Ventricular Rate:  127 PR Interval:    QRS Duration: 88 QT Interval:  326 QTC Calculation: 474 R Axis:   54 Text Interpretation:  Atrial fibrillation Probable LVH with secondary repol abnrm electrical pause noted no other signsificant change Confirmed by Jola Schmidt [phone]) on 12/05/2018 12:54:42 PM   Radiology Dg Chest Portable 1 View  Result Date: 12/05/2018 CLINICAL DATA:  84 year old female with history of generalized weakness. Recent history of pneumonia. Patient is in atrial fibrillation. EXAM: PORTABLE CHEST 1 VIEW COMPARISON:  Chest x-ray 12/01/2018. FINDINGS: Suture line in the right mid lung, presumably from prior right middle lobectomy. Lung volumes are normal. No consolidative airspace disease. No pleural effusions. No pneumothorax. No pulmonary nodule or mass noted. Pulmonary vasculature and the cardiomediastinal silhouette are within normal limits. Atherosclerosis in the thoracic aorta. Status post median sternotomy for  aortic valve replacement. IMPRESSION: 1. No radiographic evidence of acute cardiopulmonary disease. 2. Aortic atherosclerosis. Electronically Signed   By: [name] M.D.   On: 12/05/2018 12:54    Procedures .Critical Care Performed by: Jola Schmidt, MD Authorized by: Jola Schmidt, MD   Critical care provider statement:    Critical care time (minutes):  38   Critical care was time spent personally by me on the following activities:  Discussions with consultants, evaluation of patient's response to treatment, examination of patient, ordering and performing treatments and interventions, ordering and review of laboratory studies, ordering and review of radiographic studies, pulse oximetry, re-evaluation of patient's condition, obtaining history from patient or surrogate and review of old charts   (including critical care time)  Medications Ordered in ED Medications  amLODipine (NORVASC) tablet 5 mg (5 mg Oral Not Given 12/05/18 1735)  amiodarone (PACERONE) tablet 200 mg (200 mg Oral Not Given 12/05/18 1735)  furosemide (LASIX) tablet 20 mg (20 mg Oral Not Given 12/05/18 1736)  lisinopril (PRINIVIL,ZESTRIL) tablet 40 mg (40 mg Oral Not Given 12/05/18 1736)  albuterol (PROVENTIL) (2.5 MG/3ML) 0.083% nebulizer solution 2.5 mg (has no administration in time range)  amoxicillin-clavulanate (AUGMENTIN) 875-125 MG per tablet 1 tablet (1 tablet Oral Given 12/05/18 2247)  acetaminophen (TYLENOL) tablet 325-650 mg (has no administration in time range)  ondansetron (ZOFRAN) injection 4 mg (has no administration in time range)  ceFAZolin (ANCEF) IVPB 1 g/50 mL premix (1 g Intravenous New Bag/Given 12/06/18 0515)  traMADol (ULTRAM) tablet 50 mg (50 mg Oral Given 12/06/18 0326)  polyethylene glycol (MIRALAX / GLYCOLAX) packet 17 g (17 g Oral Not Given 12/05/18 2251)  ALPRAZolam (XANAX) tablet 0.25 mg (0.25 mg Oral Given 12/06/18 0329)  sodium chloride 0.9 % bolus 500 mL (0 mLs Intravenous Stopped 12/05/18 1415)    gentamicin (GARAMYCIN) 80 mg in sodium chloride 0.9 % 500 mL irrigation (80 mg Irrigation Given 12/05/18 1557)  ceFAZolin (ANCEF) IVPB 2g/100 mL premix (0 g Intravenous Stopped 12/05/18 1542)     Initial Impression / Assessment and Plan / ED Course  I have reviewed the triage vital signs and the nursing notes.  Pertinent labs & imaging results that were available during my care of the patient were reviewed by me and considered in my medical decision making (see chart for details).     Patient here with what seems to be tachybradycardia syndrome with symptomatic complaints of lightheadedness as well as hypotension in the emergency department.  She has A. fib with RVR with prolonged electrical pauses.  Difficult to manage secondary to her rapid A. fib followed by pauses and therefore beta-blocker AV nodal agents are probably not warranted at this time.  Stat electrophysiology consultation for possible need for temporary versus permanent pacemaker given her hypotension lightheadedness today.  Seen in the emergency department by electrophysiology and thought to have tachybradycardia syndrome and felt as though she would benefit from permanent pacemaker placement.  Continue to be observed closely in the emergency department.  Blood pressure improving with IV fluids.  Patient taken to the cardiac suite by electrophysiology  Final Clinical Impressions(s) / ED Diagnoses   Final diagnoses:  Atrial fibrillation with rapid ventricular response ([place])  Tachybradycardia syndrome  ED Discharge Orders    None       Jola Schmidt, MD 12/06/18 [phone]

## 2018-12-06 NOTE — Plan of Care (Signed)
  Problem: Education: Goal: Knowledge of cardiac device and self-care will improve Outcome: Progressing   Problem: Cardiac: Goal: Ability to achieve and maintain adequate cardiopulmonary perfusion will improve Outcome: Progressing   Problem: Clinical Measurements: Goal: Ability to maintain clinical measurements within normal limits will improve Outcome: Progressing Goal: Will remain free from infection Outcome: Progressing Goal: Diagnostic test results will improve Outcome: Progressing

## 2018-12-06 NOTE — Discharge Instructions (Signed)
° ° °  Supplemental Discharge Instructions for  Pacemaker/Defibrillator Patients  Activity No heavy lifting or vigorous activity with your left/right arm for 6 to 8 weeks.  Do not raise your left/right arm above your head for one week.  Gradually raise your affected arm as drawn below.              12/09/18                    12/10/18                    12/11/18                  12/12/18 __   NO DRIVING for  1 week  ; you may begin driving on  07/24/7492 .  WOUND CARE - Keep the wound area clean and dry.  Do not get this area wet for one week. No showers for one week; you may shower on 12/12/2018    . - The tape/steri-strips on your wound will fall off; do not pull them off.  No bandage is needed on the site.  DO  NOT apply any creams, oils, or ointments to the wound area. - If you notice any drainage or discharge from the wound, any swelling or bruising at the site, or you develop a fever > 101? F after you are discharged home, call the office at once.  Special Instructions - You are still able to use cellular telephones; use the ear opposite the side where you have your pacemaker/defibrillator.  Avoid carrying your cellular phone near your device. - When traveling through airports, show security personnel your identification card to avoid being screened in the metal detectors.  Ask the security personnel to use the hand wand. - Avoid arc welding equipment, MRI testing (magnetic resonance imaging), TENS units (transcutaneous nerve stimulators).  Call the office for questions about other devices. - Avoid electrical appliances that are in poor condition or are not properly grounded. - Microwave ovens are safe to be near or to operate.

## 2018-12-06 NOTE — Consult Note (Signed)
   [facility] CM Inpatient Consult   12/06/2018  Laurie [name] 11-06-1935 [id number]  Patient screened for potential [facility] Management services.Met with the patient at the bedside.  Patient hard of hearing and placed her hearing aides in and still had some difficulty hearing.  Explained to patient of [place] Management's role in post hospital needs in  patient's [facility] Medicare plan and patient endorses Dr. Beatrice Lecher, MD as her primary care provider. This office is listed to provide the transition of care follow up.   Patient states she has a daughter that helps her and will provide her transportation home.  Patient did not have any community care management needs. Patient has a new Pacemaker and a little sore at site.  She states she has a 24 hour nurse line magnet on her refrigerator at home already.  She did accept another brochure and magnet.  She had concerns for her insurance card and asked if some one could make copies for the billing.  Notified Network engineer of patient's request and admitting was contacted. Will assign patient to [facility] call follow up.  Please place a [facility] Care Management consult or for questions contact:   Natividad Brood, RN BSN [facility] Liaison  [phone] business mobile phone Toll free office [phone]

## 2018-12-07 ENCOUNTER — Other Ambulatory Visit: Payer: Self-pay

## 2018-12-10 LAB — CULTURE, BLOOD (ROUTINE X 2)
Culture: NO GROWTH
Culture: NO GROWTH
SPECIAL REQUESTS: ADEQUATE

## 2018-12-12 ENCOUNTER — Telehealth: Payer: Self-pay | Admitting: Internal Medicine

## 2018-12-12 MED ORDER — DILTIAZEM HCL ER COATED BEADS 180 MG PO CP24: 180.0000 mg | ORAL_CAPSULE | Freq: Every day | ORAL | 3 refills | Status: DC

## 2018-12-12 NOTE — Telephone Encounter (Signed)
New Message:   Patient calling, because she is having some concerns about her pacemaker that was put in last week. Patient states that, there's a lump under where she was cut. Please call patient.

## 2018-12-12 NOTE — Telephone Encounter (Signed)
Pt states at the bottom of her incision near her arm it hurts a lot. It has a lump on the area. She also states that the medicine she is now on makes her sick. She wants to know what she should do.

## 2018-12-12 NOTE — Telephone Encounter (Signed)
Spoke with pt asked if there appeared to be a "baseball" sized lump sitting where her incision site is pt denied swelling of that size pt stated that if was just raised advised pt that some swelling was to be expected. Asked pt if she was taking the Tylenol for pain pt stated that she was but she doesn't like to take medication unless she has to. Advised to try taking Tyelnol Q6hrs as directed to keep pain from getting beyond the point of control. Advised pt that Dr. Lovena Le had recommended pt to stop the Lopressor and start Cardizem 180mg  daily verified pharmacy to send in Rx.

## 2018-12-13 ENCOUNTER — Telehealth: Payer: Self-pay

## 2018-12-13 NOTE — Telephone Encounter (Signed)
-----   Message from Evans Lance, MD sent at 12/12/2018  7:38 PM EST ----- Cultures are negative

## 2018-12-13 NOTE — Telephone Encounter (Signed)
Pt advised her culture results form the hosp.. pt is requesting to have her Pacemaker follow up at a more convenient location for her since she lives in [place]... I offered her to see Dr. Curt Bears in [place] possibly.. but advised will need to have her 12/21/2018 wound check here at [facility] as well as her 91 day follow up with Dr. Lovena Le on 03/08/2019 and at that visit we can talk with Dr. Lovena Le about changing over to Dr. Curt Bears for future visits.. pt verbalized understanding and agreed.

## 2018-12-16 ENCOUNTER — Encounter: Payer: Self-pay | Admitting: Family Medicine

## 2018-12-16 ENCOUNTER — Ambulatory Visit (INDEPENDENT_AMBULATORY_CARE_PROVIDER_SITE_OTHER): Payer: Medicare HMO | Admitting: Family Medicine

## 2018-12-16 VITALS — BP 121/57 | HR 59 | Ht 66.0 in | Wt 173.0 lb

## 2018-12-16 DIAGNOSIS — I480 Paroxysmal atrial fibrillation: Secondary | ICD-10-CM | POA: Diagnosis not present

## 2018-12-16 DIAGNOSIS — I2510 Atherosclerotic heart disease of native coronary artery without angina pectoris: Secondary | ICD-10-CM

## 2018-12-16 DIAGNOSIS — J449 Chronic obstructive pulmonary disease, unspecified: Secondary | ICD-10-CM

## 2018-12-16 DIAGNOSIS — I10 Essential (primary) hypertension: Secondary | ICD-10-CM | POA: Diagnosis not present

## 2018-12-16 DIAGNOSIS — I495 Sick sinus syndrome: Secondary | ICD-10-CM

## 2018-12-16 NOTE — Assessment & Plan Note (Signed)
Table.  Status post lung resection.  She is not had any recent flares or exacerbations.

## 2018-12-16 NOTE — Assessment & Plan Note (Signed)
Rate is well controlled.  Blood pressures well controlled.  Currently on amiodarone and diltiazem.

## 2018-12-16 NOTE — Assessment & Plan Note (Signed)
BP at goal today on current regimen.  Did not tolerate metoprolol well so we will add to intolerance list.  She has tried metoprolol twice now.

## 2018-12-16 NOTE — Progress Notes (Signed)
Established Patient Office Visit  Subjective:  Patient ID: Laurie [name], female    DOB: 12/28/1934  Age: 84 y.o. MRN: [id number]  CC:  Chief Complaint  Patient presents with  . mood    HPI Laurie [name] presents for hospital follow-up.  She was admitted to [facility] on January 6 and then discharged home the following day.  She was diagnosed with atrial fibrillation with RVR and tachybradycardia syndrome.  She had a pacemaker implanted on the sixth by Dr. Lovena Le.  She had no immediate post procedure complications.  Before her admission she had actually been diagnosed with pneumonia at a urgent care on January 2 and was still on prednisone and Augmentin.  She actually said by the time she was admitted for her heart she actually felt much better in regards to the pneumonia.  She feels like she is back to baseline she did complete the antibiotics and the prednisone.  She has not had any persistent cough.  Since the pacemaker placement she is had a little bit of soreness and swelling under that left axilla.  She says she will notice it a little bit more first thing in the morning and she thinks it might be because of the way she is sleeping.  She was initially restarted on metoprolol before discharge from the hospital but said she felt extremely tired and weak and became bradycardic on it and so called them and they stopped it.  She was changed to diltiazem 180 mg and says so far she is actually been tolerating that pretty well.  Past Medical History:  Diagnosis Date  . AAA (abdominal aortic aneurysm) ([place])   . Anemia    iron deficiency  . Ascending aorta dilatation (HCC)   . Atrial fibrillation ([place])    post op  . CAD (coronary artery disease)   . Carcinoma of lung ([place]) 08/28/2008   T2N0 moderately diff. squamous cell CA  . Cerebrovascular disease   . COPD (chronic obstructive pulmonary disease) ([place]) 2005   on CXR with R apical scaring  . Headache(784.0)    Hx: of  migraines in the past  . Hypercholesterolemia    Hx: of  . Hypertension   . Incidental pulmonary nodule, > 22mm and < 90mm    LLL  . S/P aortic valve replacement 08/28/2008   [facility]  . S/P lobectomy of lung 08/28/2008   rt middle lobe  dr. Roxy Manns  . Shingles 11-09    Past Surgical History:  Procedure Laterality Date  . ABDOMINAL SURGERY    . AORTIC VALVE REPLACEMENT  08/28/2008   #24mm [facility] Ease pericardial tissue valve  . APPENDECTOMY    . CARDIAC CATHETERIZATION    . CATARACT EXTRACTION W/ INTRAOCULAR LENS  IMPLANT, BILATERAL Bilateral   . CHOLECYSTECTOMY N/A 12/13/2013   Procedure: LAPAROSCOPIC CHOLECYSTECTOMY;  Surgeon: Ralene Ok, MD;  Location: [place];  Service: General;  Laterality: N/A;  . COLONOSCOPY     \  . DILATION AND CURETTAGE OF UTERUS    . ERCP N/A 05/08/2015   Procedure: ENDOSCOPIC RETROGRADE CHOLANGIOPANCREATOGRAPHY (ERCP);  Surgeon: Inda Castle, MD;  Location: [place];  Service: Endoscopy;  Laterality: N/A;  . ERCP N/A 01/13/2016   Procedure: ENDOSCOPIC RETROGRADE CHOLANGIOPANCREATOGRAPHY (ERCP);  Surgeon: Doran Stabler, MD;  Location: [facility];  Service: Endoscopy;  Laterality: N/A;  . ERCP N/A 03/09/2016   Procedure: ENDOSCOPIC RETROGRADE CHOLANGIOPANCREATOGRAPHY (ERCP);  Surgeon: Ladene Artist, MD;  Location: [facility];  Service: Endoscopy;  Laterality: N/A;  . PACEMAKER IMPLANT N/A 12/05/2018   Procedure: PACEMAKER IMPLANT;  Surgeon: Evans Lance, MD;  Location: [place] [facility];  Service: Cardiovascular;  Laterality: N/A;  . RML removed Dr Ricard Dillon for lung cancer  08/28/2008   T2N0 squamous cell CA  . TONSILLECTOMY AND ADENOIDECTOMY    . u/s guided aspiration of R breast cyst   2009   at the breast clinic    Family History  Problem Relation Age of Onset  . Stroke Father   . Diabetes Son        brittle diabetes  . Colon polyps Neg Hx   . Colon cancer Neg Hx   . Esophageal cancer Neg Hx   . Stomach cancer Neg Hx   .  Pancreatic cancer Neg Hx   . Kidney disease Neg Hx   . Liver disease Neg Hx     Social History   Socioeconomic History  . Marital status: Single    Spouse name: Not on file  . Number of children: Not on file  . Years of education: Not on file  . Highest education level: Not on file  Occupational History  . Not on file  Social Needs  . Financial resource strain: Not on file  . Food insecurity:    Worry: Not on file    Inability: Not on file  . Transportation needs:    Medical: Not on file    Non-medical: Not on file  Tobacco Use  . Smoking status: Former Smoker    Years: 40.00  . Smokeless tobacco: Never Used  Substance and Sexual Activity  . Alcohol use: No    Alcohol/week: 0.0 standard drinks  . Drug use: No  . Sexual activity: Not on file  Lifestyle  . Physical activity:    Days per week: Not on file    Minutes per session: Not on file  . Stress: Not on file  Relationships  . Social connections:    Talks on phone: Not on file    Gets together: Not on file    Attends religious service: Not on file    Active member of club or organization: Not on file    Attends meetings of clubs or organizations: Not on file    Relationship status: Not on file  . Intimate partner violence:    Fear of current or ex partner: Not on file    Emotionally abused: Not on file    Physically abused: Not on file    Forced sexual activity: Not on file  Other Topics Concern  . Not on file  Social History Narrative  . Not on file    Outpatient Medications Prior to Visit  Medication Sig Dispense Refill  . acetaminophen (TYLENOL) 500 MG tablet Take 500 mg by mouth daily as needed (pain).     Marland Kitchen albuterol (PROVENTIL) (2.5 MG/3ML) 0.083% nebulizer solution Take 3 mLs (2.5 mg total) by nebulization every 4 (four) hours as needed for wheezing or shortness of breath. 30 vial 0  . ALPRAZolam (XANAX) 0.25 MG tablet TAKE 1-2 TABLETS (0.25MG  TO 0.5MG  TOTAL) BY MOUTH TWICE DAILY AS NEEDED (Patient  taking differently: Take 0.25 mg by mouth 2 (two) times daily as needed for anxiety or sleep. ) 120 tablet 0  . amiodarone (PACERONE) 200 MG tablet TAKE 1 TABLET BY MOUTH EVERY DAY (Patient taking differently: Take 200 mg by mouth daily before breakfast. ) 90 tablet 1  . amLODipine (NORVASC) 5  MG tablet Take 1 tablet (5 mg total) by mouth daily. (Patient taking differently: Take 5 mg by mouth daily after supper. ) 90 tablet 3  . b complex vitamins tablet Take 1 tablet by mouth daily with supper.    . diltiazem (CARDIZEM CD) 180 MG 24 hr capsule Take 1 capsule (180 mg total) by mouth daily for 30 days. 90 capsule 3  . Docusate Sodium (STOOL SOFTENER LAXATIVE PO) Take 1 capsule by mouth at bedtime as needed (constipation).     . furosemide (LASIX) 20 MG tablet Take 1 tablet (20 mg total) by mouth daily. 90 tablet 3  . lisinopril (PRINIVIL,ZESTRIL) 40 MG tablet TAKE 1 TABLET BY MOUTH EVERY DAY (Patient taking differently: Take 40 mg by mouth daily before breakfast. ) 90 tablet 3  . polyethylene glycol powder (GLYCOLAX/MIRALAX) powder Take 17 g by mouth daily. (Patient taking differently: Take 17 g by mouth daily as needed (constipation). ) 250 g 2  . traMADol (ULTRAM) 50 MG tablet TAKE 1 TABLET BY MOUTH THREE TIMES A DAY AS NEEDED FOR PAIN (Patient taking differently: Take 50 mg by mouth daily as needed for severe pain. ) 90 tablet 2  . XARELTO 20 MG TABS tablet TAKE 1 TABLET (20 MG TOTAL) BY MOUTH DAILY WITH SUPPER. (Patient taking differently: Take 20 mg by mouth daily after supper. High risk med: Anticoagulant.  Crushed Xarelto can be given down a G-tube but NOT a J-Tube.) 90 tablet 1  . amoxicillin-clavulanate (AUGMENTIN) 875-125 MG tablet Take 1 tablet by mouth 2 (two) times daily. One po bid x 7 days (Patient taking differently: Take 1 tablet by mouth 2 (two) times daily. ) 14 tablet 0  . diclofenac sodium (VOLTAREN) 1 % GEL Apply 2 g topically 4 (four) times daily. To affected joint. (Patient not  taking: Reported on 12/05/2018) 100 g 11  . lactose free nutrition (BOOST) LIQD Take 237 mLs by mouth daily.    . predniSONE (DELTASONE) 10 MG tablet Take 40-60 mg by mouth See admin instructions. Started 12/04/18- take 6 tablets (60 mg) by mouth 1st day, then take 4 tablets (40 mg) daily for four days.     No facility-administered medications prior to visit.     Allergies  Allergen Reactions  . Hyoscyamine Other (See Comments)    Mental status changes.  (as of 02/13/16 patient denies any problems with this medication)  . Rofecoxib Anaphylaxis and Hives  . Statins Other (See Comments)    Muscle aches  . Metoprolol Other (See Comments)    Extreme fatigue, weakness and brady    ROS Review of Systems    Objective:    Physical Exam  Constitutional: She is oriented to person, place, and time. She appears well-developed and well-nourished.  HENT:  Head: Normocephalic and atraumatic.  Cardiovascular: Normal rate and regular rhythm.  3/6 systolic ejection murmur.  Pulmonary/Chest: Effort normal and breath sounds normal.  Neurological: She is alert and oriented to person, place, and time.  Skin: Skin is warm and dry.  Psychiatric: She has a normal mood and affect. Her behavior is normal.    BP (!) 121/57   Pulse (!) 59   Ht 5\' 6"  (1.676 m)   Wt 173 lb (78.5 kg)   SpO2 97%   BMI 27.92 kg/m  Wt Readings from Last 3 Encounters:  12/16/18 173 lb (78.5 kg)  12/06/18 173 lb 11.2 oz (78.8 kg)  12/01/18 175 lb (79.4 kg)     There are no  preventive care reminders to display for this patient.  There are no preventive care reminders to display for this patient.  Lab Results  Component Value Date   TSH 1.64 01/13/2018   Lab Results  Component Value Date   WBC 18.1 (H) 12/05/2018   HGB 13.4 12/05/2018   HCT 41.6 12/05/2018   MCV 90.8 12/05/2018   PLT 388 12/05/2018   Lab Results  Component Value Date   NA 130 (L) 12/06/2018   K 4.1 12/06/2018   CO2 25 12/06/2018   GLUCOSE  91 12/06/2018   BUN 15 12/06/2018   CREATININE 1.06 (H) 12/06/2018   BILITOT 0.5 01/13/2018   ALKPHOS 77 06/24/2017   AST 15 01/13/2018   ALT 16 01/13/2018   PROT 6.3 01/13/2018   ALBUMIN 4.0 06/24/2017   CALCIUM 8.4 (L) 12/06/2018   ANIONGAP 8 12/06/2018   GFR 87.46 04/25/2015   Lab Results  Component Value Date   CHOL 291 (H) 09/18/2010   Lab Results  Component Value Date   HDL 38 (L) 09/18/2010   Lab Results  Component Value Date   LDLCALC 191 (H) 09/18/2010   Lab Results  Component Value Date   TRIG 308 (H) 09/18/2010   Lab Results  Component Value Date   CHOLHDL 7.7 Ratio 09/18/2010   Lab Results  Component Value Date   HGBA1C 6.2 (H) 04/18/2015      Assessment & Plan:   Problem List Items Addressed This Visit      Cardiovascular and Mediastinum   Tachy-brady syndrome ([place])    Status post pacemaker placement.  She is doing well she still just has a little soreness and swelling in the axilla.  Gave her reassurance.  She does have a pacer check coming up as well as an appointment in the spring with Dr. Lovena Le.      Paroxysmal atrial fibrillation (HCC)    Rate is well controlled.  Blood pressures well controlled.  Currently on amiodarone and diltiazem.      Essential hypertension, benign - Primary    BP at goal today on current regimen.  Did not tolerate metoprolol well so we will add to intolerance list.  She has tried metoprolol twice now.      Coronary atherosclerosis     Respiratory   COPD (chronic obstructive pulmonary disease) ([place])    Table.  Status post lung resection.  She is not had any recent flares or exacerbations.         No orders of the defined types were placed in this encounter.   Follow-up: Return in about 4 months (around 04/16/2019) for COPD and BP.    Beatrice Lecher, MD

## 2018-12-16 NOTE — Assessment & Plan Note (Signed)
Status post pacemaker placement.  She is doing well she still just has a little soreness and swelling in the axilla.  Gave her reassurance.  She does have a pacer check coming up as well as an appointment in the spring with Dr. Lovena Le.

## 2018-12-17 ENCOUNTER — Emergency Department (HOSPITAL_COMMUNITY): Payer: Medicare HMO

## 2018-12-17 ENCOUNTER — Encounter (HOSPITAL_COMMUNITY): Payer: Self-pay | Admitting: Emergency Medicine

## 2018-12-17 ENCOUNTER — Inpatient Hospital Stay (HOSPITAL_COMMUNITY)
Admission: EM | Admit: 2018-12-17 | Discharge: 2018-12-20 | DRG: 183 | Disposition: A | Discharge status: Skilled Nursing Facility | Payer: Medicare HMO | Attending: Internal Medicine | Admitting: Internal Medicine

## 2018-12-17 ENCOUNTER — Other Ambulatory Visit: Payer: Self-pay

## 2018-12-17 DIAGNOSIS — I480 Paroxysmal atrial fibrillation: Secondary | ICD-10-CM | POA: Diagnosis present

## 2018-12-17 DIAGNOSIS — I959 Hypotension, unspecified: Secondary | ICD-10-CM

## 2018-12-17 DIAGNOSIS — S2241XB Multiple fractures of ribs, right side, initial encounter for open fracture: Secondary | ICD-10-CM | POA: Diagnosis not present

## 2018-12-17 DIAGNOSIS — M25511 Pain in right shoulder: Secondary | ICD-10-CM | POA: Diagnosis present

## 2018-12-17 DIAGNOSIS — W19XXXA Unspecified fall, initial encounter: Secondary | ICD-10-CM | POA: Diagnosis present

## 2018-12-17 DIAGNOSIS — J449 Chronic obstructive pulmonary disease, unspecified: Secondary | ICD-10-CM | POA: Diagnosis present

## 2018-12-17 DIAGNOSIS — Z79891 Long term (current) use of opiate analgesic: Secondary | ICD-10-CM

## 2018-12-17 DIAGNOSIS — J9601 Acute respiratory failure with hypoxia: Secondary | ICD-10-CM | POA: Diagnosis present

## 2018-12-17 DIAGNOSIS — Z952 Presence of prosthetic heart valve: Secondary | ICD-10-CM

## 2018-12-17 DIAGNOSIS — F411 Generalized anxiety disorder: Secondary | ICD-10-CM

## 2018-12-17 DIAGNOSIS — S4991XA Unspecified injury of right shoulder and upper arm, initial encounter: Secondary | ICD-10-CM | POA: Diagnosis not present

## 2018-12-17 DIAGNOSIS — W010XXA Fall on same level from slipping, tripping and stumbling without subsequent striking against object, initial encounter: Secondary | ICD-10-CM | POA: Diagnosis present

## 2018-12-17 DIAGNOSIS — I5032 Chronic diastolic (congestive) heart failure: Secondary | ICD-10-CM | POA: Diagnosis not present

## 2018-12-17 DIAGNOSIS — D72829 Elevated white blood cell count, unspecified: Secondary | ICD-10-CM | POA: Diagnosis not present

## 2018-12-17 DIAGNOSIS — M6281 Muscle weakness (generalized): Secondary | ICD-10-CM | POA: Diagnosis not present

## 2018-12-17 DIAGNOSIS — E785 Hyperlipidemia, unspecified: Secondary | ICD-10-CM | POA: Diagnosis present

## 2018-12-17 DIAGNOSIS — Z888 Allergy status to other drugs, medicaments and biological substances status: Secondary | ICD-10-CM

## 2018-12-17 DIAGNOSIS — I712 Thoracic aortic aneurysm, without rupture: Secondary | ICD-10-CM | POA: Diagnosis present

## 2018-12-17 DIAGNOSIS — Z87891 Personal history of nicotine dependence: Secondary | ICD-10-CM

## 2018-12-17 DIAGNOSIS — R0902 Hypoxemia: Secondary | ICD-10-CM

## 2018-12-17 DIAGNOSIS — Z743 Need for continuous supervision: Secondary | ICD-10-CM | POA: Diagnosis not present

## 2018-12-17 DIAGNOSIS — S2241XA Multiple fractures of ribs, right side, initial encounter for closed fracture: Principal | ICD-10-CM

## 2018-12-17 DIAGNOSIS — Y92003 Bedroom of unspecified non-institutional (private) residence as the place of occurrence of the external cause: Secondary | ICD-10-CM | POA: Diagnosis not present

## 2018-12-17 DIAGNOSIS — Z79899 Other long term (current) drug therapy: Secondary | ICD-10-CM

## 2018-12-17 DIAGNOSIS — Z9089 Acquired absence of other organs: Secondary | ICD-10-CM

## 2018-12-17 DIAGNOSIS — S0990XA Unspecified injury of head, initial encounter: Secondary | ICD-10-CM | POA: Diagnosis not present

## 2018-12-17 DIAGNOSIS — Z902 Acquired absence of lung [part of]: Secondary | ICD-10-CM

## 2018-12-17 DIAGNOSIS — Z950 Presence of cardiac pacemaker: Secondary | ICD-10-CM | POA: Diagnosis not present

## 2018-12-17 DIAGNOSIS — Z7901 Long term (current) use of anticoagulants: Secondary | ICD-10-CM

## 2018-12-17 DIAGNOSIS — R2681 Unsteadiness on feet: Secondary | ICD-10-CM | POA: Diagnosis not present

## 2018-12-17 DIAGNOSIS — Z833 Family history of diabetes mellitus: Secondary | ICD-10-CM

## 2018-12-17 DIAGNOSIS — N179 Acute kidney failure, unspecified: Secondary | ICD-10-CM | POA: Diagnosis not present

## 2018-12-17 DIAGNOSIS — D509 Iron deficiency anemia, unspecified: Secondary | ICD-10-CM | POA: Diagnosis not present

## 2018-12-17 DIAGNOSIS — Z9841 Cataract extraction status, right eye: Secondary | ICD-10-CM

## 2018-12-17 DIAGNOSIS — Z8619 Personal history of other infectious and parasitic diseases: Secondary | ICD-10-CM

## 2018-12-17 DIAGNOSIS — I2510 Atherosclerotic heart disease of native coronary artery without angina pectoris: Secondary | ICD-10-CM | POA: Diagnosis present

## 2018-12-17 DIAGNOSIS — Z9049 Acquired absence of other specified parts of digestive tract: Secondary | ICD-10-CM

## 2018-12-17 DIAGNOSIS — M549 Dorsalgia, unspecified: Secondary | ICD-10-CM | POA: Diagnosis not present

## 2018-12-17 DIAGNOSIS — R52 Pain, unspecified: Secondary | ICD-10-CM | POA: Diagnosis not present

## 2018-12-17 DIAGNOSIS — R279 Unspecified lack of coordination: Secondary | ICD-10-CM | POA: Diagnosis not present

## 2018-12-17 DIAGNOSIS — Z85118 Personal history of other malignant neoplasm of bronchus and lung: Secondary | ICD-10-CM

## 2018-12-17 DIAGNOSIS — S2239XA Fracture of one rib, unspecified side, initial encounter for closed fracture: Secondary | ICD-10-CM

## 2018-12-17 DIAGNOSIS — I110 Hypertensive heart disease with heart failure: Secondary | ICD-10-CM | POA: Diagnosis not present

## 2018-12-17 DIAGNOSIS — R1084 Generalized abdominal pain: Secondary | ICD-10-CM | POA: Diagnosis not present

## 2018-12-17 DIAGNOSIS — S299XXA Unspecified injury of thorax, initial encounter: Secondary | ICD-10-CM | POA: Diagnosis not present

## 2018-12-17 DIAGNOSIS — Z961 Presence of intraocular lens: Secondary | ICD-10-CM | POA: Diagnosis present

## 2018-12-17 DIAGNOSIS — Z8673 Personal history of transient ischemic attack (TIA), and cerebral infarction without residual deficits: Secondary | ICD-10-CM

## 2018-12-17 DIAGNOSIS — J90 Pleural effusion, not elsewhere classified: Secondary | ICD-10-CM

## 2018-12-17 DIAGNOSIS — S199XXA Unspecified injury of neck, initial encounter: Secondary | ICD-10-CM | POA: Diagnosis not present

## 2018-12-17 DIAGNOSIS — Z9842 Cataract extraction status, left eye: Secondary | ICD-10-CM

## 2018-12-17 DIAGNOSIS — I482 Chronic atrial fibrillation, unspecified: Secondary | ICD-10-CM | POA: Diagnosis not present

## 2018-12-17 DIAGNOSIS — M25519 Pain in unspecified shoulder: Secondary | ICD-10-CM | POA: Diagnosis not present

## 2018-12-17 DIAGNOSIS — S2241XD Multiple fractures of ribs, right side, subsequent encounter for fracture with routine healing: Secondary | ICD-10-CM | POA: Diagnosis not present

## 2018-12-17 DIAGNOSIS — Z823 Family history of stroke: Secondary | ICD-10-CM

## 2018-12-17 DIAGNOSIS — R278 Other lack of coordination: Secondary | ICD-10-CM | POA: Diagnosis not present

## 2018-12-17 DIAGNOSIS — M542 Cervicalgia: Secondary | ICD-10-CM | POA: Diagnosis not present

## 2018-12-17 DIAGNOSIS — R41841 Cognitive communication deficit: Secondary | ICD-10-CM | POA: Diagnosis not present

## 2018-12-17 LAB — CBC WITH DIFFERENTIAL/PLATELET
ABS IMMATURE GRANULOCYTES: 0.19 10*3/uL — AB (ref 0.00–0.07)
BASOS PCT: 0 %
Basophils Absolute: 0.1 10*3/uL (ref 0.0–0.1)
Eosinophils Absolute: 0 10*3/uL (ref 0.0–0.5)
Eosinophils Relative: 0 %
HCT: 46.1 % — ABNORMAL HIGH (ref 36.0–46.0)
HEMOGLOBIN: 14.3 g/dL (ref 12.0–15.0)
Immature Granulocytes: 1 %
Lymphocytes Relative: 3 %
Lymphs Abs: 0.6 10*3/uL — ABNORMAL LOW (ref 0.7–4.0)
MCH: 28.1 pg (ref 26.0–34.0)
MCHC: 31 g/dL (ref 30.0–36.0)
MCV: 90.6 fL (ref 80.0–100.0)
Monocytes Absolute: 2.2 10*3/uL — ABNORMAL HIGH (ref 0.1–1.0)
Monocytes Relative: 10 %
NEUTROS ABS: 19.4 10*3/uL — AB (ref 1.7–7.7)
Neutrophils Relative %: 86 %
Platelets: 269 10*3/uL (ref 150–400)
RBC: 5.09 MIL/uL (ref 3.87–5.11)
RDW: 13 % (ref 11.5–15.5)
WBC: 22.4 10*3/uL — ABNORMAL HIGH (ref 4.0–10.5)
nRBC: 0 % (ref 0.0–0.2)

## 2018-12-17 LAB — COMPREHENSIVE METABOLIC PANEL
ALK PHOS: 71 U/L (ref 38–126)
ALT: 33 U/L (ref 0–44)
AST: 44 U/L — ABNORMAL HIGH (ref 15–41)
Albumin: 3 g/dL — ABNORMAL LOW (ref 3.5–5.0)
Anion gap: 15 (ref 5–15)
BUN: 12 mg/dL (ref 8–23)
CO2: 20 mmol/L — ABNORMAL LOW (ref 22–32)
CREATININE: 1.62 mg/dL — AB (ref 0.44–1.00)
Calcium: 9.1 mg/dL (ref 8.9–10.3)
Chloride: 98 mmol/L (ref 98–111)
GFR calc Af Amer: 34 mL/min — ABNORMAL LOW (ref >60)
GFR calc non Af Amer: 29 mL/min — ABNORMAL LOW (ref >60)
Glucose, Bld: 159 mg/dL — ABNORMAL HIGH (ref 70–99)
Potassium: 4.1 mmol/L (ref 3.5–5.1)
Sodium: 133 mmol/L — ABNORMAL LOW (ref 135–145)
Total Bilirubin: 0.7 mg/dL (ref 0.3–1.2)
Total Protein: 5.3 g/dL — ABNORMAL LOW (ref 6.5–8.1)

## 2018-12-17 LAB — URINALYSIS, ROUTINE W REFLEX MICROSCOPIC
GLUCOSE, UA: NEGATIVE mg/dL
HGB URINE DIPSTICK: NEGATIVE
Leukocytes, UA: NEGATIVE
Nitrite: NEGATIVE
Protein, ur: 100 mg/dL — AB
Specific Gravity, Urine: 1.015 (ref 1.005–1.030)
pH: 6 (ref 5.0–8.0)

## 2018-12-17 LAB — URINALYSIS, MICROSCOPIC (REFLEX): RBC / HPF: NONE SEEN RBC/hpf (ref 0–5)

## 2018-12-17 MED ORDER — RIVAROXABAN 15 MG PO TABS
15.0000 mg | ORAL_TABLET | Freq: Every day | ORAL | Status: DC
  Administered 2018-12-18 – 2018-12-19 (×2): 15 mg via ORAL
  Filled 2018-12-17 (×2): qty 1

## 2018-12-17 MED ORDER — FENTANYL CITRATE (PF) 100 MCG/2ML IJ SOLN
50.0000 ug | Freq: Once | INTRAMUSCULAR | Status: AC
  Administered 2018-12-17: 50 ug via INTRAVENOUS
  Filled 2018-12-17: qty 2

## 2018-12-17 MED ORDER — POLYETHYLENE GLYCOL 3350 17 GM/SCOOP PO POWD
17.0000 g | Freq: Every day | ORAL | Status: DC | PRN
  Filled 2018-12-17: qty 255

## 2018-12-17 MED ORDER — ONDANSETRON HCL 4 MG/2ML IJ SOLN: 4.0000 mg | Freq: Four times a day (QID) | INTRAMUSCULAR | Status: DC | PRN

## 2018-12-17 MED ORDER — ALBUTEROL SULFATE (2.5 MG/3ML) 0.083% IN NEBU: 2.5000 mg | INHALATION_SOLUTION | RESPIRATORY_TRACT | Status: DC | PRN

## 2018-12-17 MED ORDER — ACETAMINOPHEN 325 MG PO TABS
650.0000 mg | ORAL_TABLET | Freq: Four times a day (QID) | ORAL | Status: DC | PRN
  Administered 2018-12-20: 650 mg via ORAL
  Filled 2018-12-17 (×4): qty 2

## 2018-12-17 MED ORDER — TRAMADOL HCL 50 MG PO TABS
50.0000 mg | ORAL_TABLET | Freq: Four times a day (QID) | ORAL | Status: DC | PRN
  Administered 2018-12-17 – 2018-12-20 (×6): 50 mg via ORAL
  Filled 2018-12-17 (×6): qty 1

## 2018-12-17 MED ORDER — FENTANYL CITRATE (PF) 100 MCG/2ML IJ SOLN
25.0000 ug | INTRAMUSCULAR | Status: DC | PRN
  Administered 2018-12-17: 25 ug via INTRAVENOUS
  Filled 2018-12-17: qty 2

## 2018-12-17 MED ORDER — SODIUM CHLORIDE 0.9 % IV SOLN
INTRAVENOUS | Status: AC
  Administered 2018-12-17: 15:00:00 via INTRAVENOUS

## 2018-12-17 MED ORDER — DOCUSATE SODIUM 100 MG PO CAPS: 100.0000 mg | ORAL_CAPSULE | Freq: Every day | ORAL | Status: DC | PRN

## 2018-12-17 MED ORDER — LIDOCAINE 5 % EX PTCH
1.0000 | MEDICATED_PATCH | CUTANEOUS | Status: DC
  Administered 2018-12-17 – 2018-12-19 (×3): 1 via TRANSDERMAL
  Filled 2018-12-17 (×4): qty 1

## 2018-12-17 MED ORDER — ONDANSETRON HCL 4 MG PO TABS: 4.0000 mg | ORAL_TABLET | Freq: Four times a day (QID) | ORAL | Status: DC | PRN

## 2018-12-17 MED ORDER — B COMPLEX-C PO TABS
1.0000 | ORAL_TABLET | Freq: Every day | ORAL | Status: DC
  Administered 2018-12-17 – 2018-12-20 (×5): 1 via ORAL
  Filled 2018-12-17 (×4): qty 1

## 2018-12-17 MED ORDER — B COMPLEX PO TABS: 1.0000 | ORAL_TABLET | Freq: Every day | ORAL | Status: DC

## 2018-12-17 MED ORDER — SODIUM CHLORIDE 0.9 % IV BOLUS (SEPSIS)
1000.0000 mL | Freq: Once | INTRAVENOUS | Status: AC
  Administered 2018-12-17: 1000 mL via INTRAVENOUS

## 2018-12-17 MED ORDER — DILTIAZEM HCL ER COATED BEADS 180 MG PO CP24
180.0000 mg | ORAL_CAPSULE | Freq: Every day | ORAL | Status: DC
  Administered 2018-12-18 – 2018-12-20 (×3): 180 mg via ORAL
  Filled 2018-12-17 (×3): qty 1

## 2018-12-17 MED ORDER — ALPRAZOLAM 0.25 MG PO TABS: 0.2500 mg | ORAL_TABLET | Freq: Two times a day (BID) | ORAL | Status: DC | PRN

## 2018-12-17 MED ORDER — ONDANSETRON HCL 4 MG/2ML IJ SOLN
4.0000 mg | Freq: Once | INTRAMUSCULAR | Status: AC
  Administered 2018-12-17: 4 mg via INTRAVENOUS
  Filled 2018-12-17: qty 2

## 2018-12-17 MED ORDER — ACETAMINOPHEN 650 MG RE SUPP: 650.0000 mg | Freq: Four times a day (QID) | RECTAL | Status: DC | PRN

## 2018-12-17 MED ORDER — AMIODARONE HCL 200 MG PO TABS
200.0000 mg | ORAL_TABLET | Freq: Every day | ORAL | Status: DC
  Administered 2018-12-18 – 2018-12-20 (×3): 200 mg via ORAL
  Filled 2018-12-17 (×3): qty 1

## 2018-12-17 NOTE — H&P (Addendum)
History and Physical    DOA: 12/17/2018  PCP: Hali Marry, MD  Patient coming from: home  Chief Complaint: Fall  HPI: Laurie [name] is a 84 y.o. female with history h/o thoracic aortic aneurysm, paroxysmal atrial fibrillation, COPD, hypertension, hyperlipidemia, bovine aortic valve replacement on Xarelto who presents to the emergency department after a fall.  Patient lives alone and at baseline ambulates independently without the need for cane or walker.  She recalls going to bed feeling fine at 11 PM last night.  Little after midnight she felt the urge to use the bathroom and got out of her bed when her leg got trapped in the sheets/bed and she fell onto her right side.  She denies hitting her head but is complaining of right shoulder pain 6/10 as well as right lateral chest wall pain at 10/10.  Trauma work-up in the ED including CT cervical spine, thoracic spine, CT head negative for any acute injuries/findings.  CT chest however shows acute posterolateral sixth and seventh rib fractures with no pneumothorax.  Trace right pleural effusion and stable 4.6 cm thoracic aortic aneurysm reported.  On arrival, patient's blood pressure was apparently low in the 80s and receivedIV fluid bolus.Patient also received IV fentanyl 50 mcg and her blood pressure appears to be in the low 100s now and is rating her pain level at 8/10 along the ribs.  Additionally, patient was noted to be hypoxic requiring 2 L nasal cannula.  Lab work shows leukocytosis of 22,000, AKI with creatinine up to 1.6.  She is requested to be admitted for observation/pain management and possible splinting related hypoxia.Of note, patient underwent pacemaker placement 10 days back and her anticoagulation was held for a week prior to procedure.  Patient reports resuming anticoagulation on postop day 6.  She denies any signs of acute bleeding and lab work in the ED showed stable hemoglobin at 15.   Review of Systems: As per HPI  otherwise 10 point review of systems negative.    Past Medical History:  Diagnosis Date  . AAA (abdominal aortic aneurysm) ([place])   . Anemia    iron deficiency  . Ascending aorta dilatation ([facility])   . Atrial fibrillation ([place])    post op  . CAD (coronary artery disease)   . Carcinoma of lung ([place]) 08/28/2008   T2N0 moderately diff. squamous cell CA  . Cerebrovascular disease   . COPD (chronic obstructive pulmonary disease) ([place]) 2005   on CXR with R apical scaring  . Headache(784.0)    Hx: of migraines in the past  . Hypercholesterolemia    Hx: of  . Hypertension   . Incidental pulmonary nodule, > 51mm and < 52mm    LLL  . S/P aortic valve replacement 08/28/2008   [facility]  . S/P lobectomy of lung 08/28/2008   rt middle lobe  dr. Roxy Manns  . Shingles 11-09    Past Surgical History:  Procedure Laterality Date  . ABDOMINAL SURGERY    . AORTIC VALVE REPLACEMENT  08/28/2008   #72mm [facility] Ease pericardial tissue valve  . APPENDECTOMY    . CARDIAC CATHETERIZATION    . CATARACT EXTRACTION W/ INTRAOCULAR LENS  IMPLANT, BILATERAL Bilateral   . CHOLECYSTECTOMY N/A 12/13/2013   Procedure: LAPAROSCOPIC CHOLECYSTECTOMY;  Surgeon: Ralene Ok, MD;  Location: [facility];  Service: General;  Laterality: N/A;  . COLONOSCOPY     \  . DILATION AND CURETTAGE OF UTERUS    . ERCP N/A 05/08/2015   Procedure:  ENDOSCOPIC RETROGRADE CHOLANGIOPANCREATOGRAPHY (ERCP);  Surgeon: Inda Castle, MD;  Location: [place];  Service: Endoscopy;  Laterality: N/A;  . ERCP N/A 01/13/2016   Procedure: ENDOSCOPIC RETROGRADE CHOLANGIOPANCREATOGRAPHY (ERCP);  Surgeon: Doran Stabler, MD;  Location: [facility];  Service: Endoscopy;  Laterality: N/A;  . ERCP N/A 03/09/2016   Procedure: ENDOSCOPIC RETROGRADE CHOLANGIOPANCREATOGRAPHY (ERCP);  Surgeon: Ladene Artist, MD;  Location: [facility];  Service: Endoscopy;  Laterality: N/A;  . PACEMAKER IMPLANT N/A 12/05/2018   Procedure: PACEMAKER IMPLANT;  Surgeon:  Evans Lance, MD;  Location: [facility];  Service: Cardiovascular;  Laterality: N/A;  . RML removed Dr Ricard Dillon for lung cancer  08/28/2008   T2N0 squamous cell CA  . TONSILLECTOMY AND ADENOIDECTOMY    . u/s guided aspiration of R breast cyst   2009   at the breast clinic    Social history:  reports that she has quit smoking. She quit after 40.00 years of use. She has never used smokeless tobacco. She reports that she does not drink alcohol or use drugs.   Allergies  Allergen Reactions  . Hyoscyamine Other (See Comments)    Mental status changes.  (as of 02/13/16 patient denies any problems with this medication)  . Rofecoxib Anaphylaxis and Hives  . Statins Other (See Comments)    Muscle aches  . Metoprolol Other (See Comments)    Extreme fatigue, weakness and brady    Family History  Problem Relation Age of Onset  . Stroke Father   . Diabetes Son        brittle diabetes  . Colon polyps Neg Hx   . Colon cancer Neg Hx   . Esophageal cancer Neg Hx   . Stomach cancer Neg Hx   . Pancreatic cancer Neg Hx   . Kidney disease Neg Hx   . Liver disease Neg Hx       Prior to Admission medications   Medication Sig Start Date End Date Taking? Authorizing Provider  albuterol (PROVENTIL) (2.5 MG/3ML) 0.083% nebulizer solution Take 3 mLs (2.5 mg total) by nebulization every 4 (four) hours as needed for wheezing or shortness of breath. 12/01/18  Yes Phelps, Erin O, PA-C  ALPRAZolam (XANAX) 0.25 MG tablet TAKE 1-2 TABLETS (0.25MG  TO 0.5MG  TOTAL) BY MOUTH TWICE DAILY AS NEEDED Patient taking differently: Take 0.25 mg by mouth 2 (two) times daily as needed for anxiety or sleep.  11/18/18  Yes Hali Marry, MD  amiodarone (PACERONE) 200 MG tablet TAKE 1 TABLET BY MOUTH EVERY DAY Patient taking differently: Take 200 mg by mouth daily before breakfast.  06/07/18  Yes Crenshaw, Denice Bors, MD  amLODipine (NORVASC) 5 MG tablet Take 1 tablet (5 mg total) by mouth daily. Patient taking  differently: Take 5 mg by mouth daily after supper.  05/10/18  Yes Lelon Perla, MD  b complex vitamins tablet Take 1 tablet by mouth daily with supper.   Yes [provider]  diltiazem (CARDIZEM CD) 180 MG 24 hr capsule Take 1 capsule (180 mg total) by mouth daily for 30 days. 12/12/18 01/11/19 Yes Evans Lance, MD  Docusate Sodium (STOOL SOFTENER LAXATIVE PO) Take 1 capsule by mouth at bedtime as needed (constipation).    Yes [provider]  furosemide (LASIX) 20 MG tablet Take 1 tablet (20 mg total) by mouth daily. 06/30/18 02/03/19 Yes Lelon Perla, MD  lisinopril (PRINIVIL,ZESTRIL) 40 MG tablet TAKE 1 TABLET BY MOUTH EVERY DAY Patient taking differently: Take  40 mg by mouth daily before breakfast.  11/07/18  Yes Crenshaw, Denice Bors, MD  metoprolol tartrate (LOPRESSOR) 50 MG tablet Take 50 mg by mouth daily.   Yes [provider]  polyethylene glycol powder (GLYCOLAX/MIRALAX) powder Take 17 g by mouth daily. Patient taking differently: Take 17 g by mouth daily as needed (constipation).  08/11/18  Yes Danis, Kirke Corin, MD  traMADol (ULTRAM) 50 MG tablet TAKE 1 TABLET BY MOUTH THREE TIMES A DAY AS NEEDED FOR PAIN Patient taking differently: Take 50 mg by mouth daily as needed for severe pain.  09/15/18  Yes Metheney, Rene Kocher, MD  XARELTO 20 MG TABS tablet TAKE 1 TABLET (20 MG TOTAL) BY MOUTH DAILY WITH SUPPER. Patient taking differently: Take 20 mg by mouth daily after supper. High risk med: Anticoagulant.  Crushed Xarelto can be given down a G-tube but NOT a J-Tube. 11/17/18  Yes Lelon Perla, MD    Physical Exam: Vitals:   12/17/18 1230 12/17/18 1246 12/17/18 1300 12/17/18 1315  BP: (!) 103/48 (!) 115/47 (!) 122/50 (!) 112/48  Pulse: 60 60 60 60  Resp: 16 19 17 17   Temp:      TempSrc:      SpO2: 92% 91% 92% 91%    Constitutional: Elderly female who appears to be in moderate distress due to rib pain Eyes: PERRL, lids and conjunctivae  normal ENMT: Mucous membranes are dry. Posterior pharynx clear of any exudate or lesions.Normal dentition.  Neck: normal, supple, no masses, no thyromegaly Respiratory: clear to auscultation bilaterally but decreased respiratory effort due to pain, no wheezing, no crackles. Normal respiratory effort. No accessory muscle use.  Cardiovascular: Regular rate and rhythm, no murmurs / rubs / gallops. No extremity edema. 2+ pedal pulses. No carotid bruits.  Abdomen: no tenderness, no masses palpated. No hepatosplenomegaly. Bowel sounds positive.  Musculoskeletal: Decreased range of motion along the right shoulder joint, tenderness along right lateral chest wall Neurologic: CN 2-12 grossly intact. Sensation intact, DTR normal. Strength 4/5 in all extremities except right upper extremity range of motion limited by pain Psychiatric: Normal judgment and insight. Alert and oriented x 3. Normal mood.  SKIN/catheters: no rashes, lesions, ulcers. No induration  Labs on Admission: I have personally reviewed following labs and imaging studies  CBC: Recent Labs  Lab 12/17/18 0745  WBC 22.4*  NEUTROABS 19.4*  HGB 14.3  HCT 46.1*  MCV 90.6  PLT 175   Basic Metabolic Panel: Recent Labs  Lab 12/17/18 0745  NA 133*  K 4.1  CL 98  CO2 20*  GLUCOSE 159*  BUN 12  CREATININE 1.62*  CALCIUM 9.1   GFR: Estimated Creatinine Clearance: 28.8 mL/min (A) (by C-G formula based on SCr of 1.62 mg/dL (H)). Liver Function Tests: Recent Labs  Lab 12/17/18 0745  AST 44*  ALT 33  ALKPHOS 71  BILITOT 0.7  PROT 5.3*  ALBUMIN 3.0*   No results for input(s): LIPASE, AMYLASE in the last 168 hours. No results for input(s): AMMONIA in the last 168 hours. Coagulation Profile: No results for input(s): INR, PROTIME in the last 168 hours. Cardiac Enzymes: No results for input(s): CKTOTAL, CKMB, CKMBINDEX, TROPONINI in the last 168 hours. BNP (last 3 results) No results for input(s): PROBNP in the last 8760  hours. HbA1C: No results for input(s): HGBA1C in the last 72 hours. CBG: No results for input(s): GLUCAP in the last 168 hours. Lipid Profile: No results for input(s): CHOL, HDL, LDLCALC, TRIG, CHOLHDL, LDLDIRECT in  the last 72 hours. Thyroid Function Tests: No results for input(s): TSH, T4TOTAL, FREET4, T3FREE, THYROIDAB in the last 72 hours. Anemia Panel: No results for input(s): VITAMINB12, FOLATE, FERRITIN, TIBC, IRON, RETICCTPCT in the last 72 hours. Urine analysis:    Component Value Date/Time   COLORURINE YELLOW (A) 12/17/2018 1057   APPEARANCEUR CLOUDY (A) 12/17/2018 1057   LABSPEC 1.015 12/17/2018 1057   PHURINE 6.0 12/17/2018 1057   GLUCOSEU NEGATIVE 12/17/2018 1057   HGBUR NEGATIVE 12/17/2018 1057   HGBUR trace-intact 06/06/2008 0907   BILIRUBINUR SMALL (A) 12/17/2018 1057   BILIRUBINUR neg 09/20/2017 1633   KETONESUR TRACE (A) 12/17/2018 1057   PROTEINUR 100 (A) 12/17/2018 1057   UROBILINOGEN 2.0 (A) 09/20/2017 1633   UROBILINOGEN 0.2 07/13/2011 1832   NITRITE NEGATIVE 12/17/2018 1057   LEUKOCYTESUR NEGATIVE 12/17/2018 1057    Radiological Exams on Admission: Dg Shoulder Right  Result Date: 12/17/2018 CLINICAL DATA:  Status post fall.  Right shoulder pain. EXAM: RIGHT SHOULDER - 2+ VIEW COMPARISON:  None. FINDINGS: There is no fracture or dislocation. The glenohumeral joint is normal. There are mild degenerative changes of the acromioclavicular joint. IMPRESSION: No acute osseous injury of the left shoulder. Electronically Signed   By: Kathreen Devoid   On: 12/17/2018 09:59   Ct Head Wo Contrast  Result Date: 12/17/2018 CLINICAL DATA:  Fall EXAM: CT HEAD WITHOUT CONTRAST CT CERVICAL SPINE WITHOUT CONTRAST TECHNIQUE: Multidetector CT imaging of the head and cervical spine was performed following the standard protocol without intravenous contrast. Multiplanar CT image reconstructions of the cervical spine were also generated. COMPARISON:  CT head 10/25/2016 FINDINGS: CT  HEAD FINDINGS Brain: Mild atrophy.  Negative for  acute infarct, hemorrhage, mass. Vascular: Negative for hyperdense vessel Skull: Negative for skull fracture Sinuses/Orbits: Mild mucosal edema paranasal sinuses. Right mastoid effusion. Bilateral cataract surgery Other: None CT CERVICAL SPINE FINDINGS Alignment: 2 mm anterolisthesis C4-5 and C7-T1 Skull base and vertebrae: Negative for fracture Soft tissues and spinal canal: Negative for mass or soft tissue swelling. Carotid artery calcification bilaterally. Disc levels: Multilevel disc and facet degeneration throughout the cervical spine. Disc degeneration and spurring causing foraminal stenosis bilaterally Upper chest: Right apical circumferential pleural thickening unchanged from 04/18/2018. Other: None IMPRESSION: 1. No acute intracranial abnormality 2. Negative for cervical spine fracture. Multilevel degenerative change. Electronically Signed   By: Franchot Gallo M.D.   On: 12/17/2018 07:32   Ct Chest Wo Contrast  Result Date: 12/17/2018 CLINICAL DATA:  Fall with right chest wall pain. History of right middle lobectomy for lung cancer in 2009. EXAM: CT CHEST WITHOUT CONTRAST TECHNIQUE: Multidetector CT imaging of the chest was performed following the standard protocol without IV contrast. COMPARISON:  12/06/2018 chest radiograph. 04/18/2018 chest CT. FINDINGS: Cardiovascular: Normal heart size. No significant pericardial effusion/thickening. Aortic valve prosthesis is in place. Three-vessel coronary atherosclerosis. Atherosclerotic thoracic aorta with stable 4.6 cm ascending thoracic aortic aneurysm. Stable ectatic lower descending thoracic aorta. No evidence of acute intramural hematoma in the thoracic aorta. Top-normal caliber main pulmonary artery (3.1 cm diameter). Mediastinum/Nodes: No discrete thyroid nodules. Unremarkable esophagus. No pathologically enlarged axillary, mediastinal or hilar lymph nodes, noting limited sensitivity for the detection of  hilar adenopathy on this noncontrast study. Lungs/Pleura: No pneumothorax. Trace basilar right pleural effusion. Stable posterior right pleural thickening with scattered pleural calcification. No left pleural effusion. Status post right middle lobectomy. Parenchymal bands throughout the basilar right lower lobe. No acute consolidative airspace disease or lung masses. A few scattered solid pulmonary nodules  in both lungs, largest 7 mm in posterior left lower lobe (series 6/image 124), all stable since 03/22/2017 chest CT, considered benign. No new significant pulmonary nodules. Upper abdomen: Scattered pneumobilia in left liver lobe, unchanged. Musculoskeletal: No aggressive appearing focal osseous lesions. Intact sternotomy wires. Acute posterolateral right sixth and seventh rib fractures. IMPRESSION: 1. Acute posterolateral right sixth and seventh rib fractures. No pneumothorax. Trace basilar right pleural effusion. 2. Chronic pleural-parenchymal scarring in the lower right chest. 3. Stable 4.6 cm ascending thoracic aortic aneurysm. Recommend semi-annual imaging followup by CTA or MRA and referral to cardiothoracic surgery if not already obtained. This recommendation follows 2010 ACCF/AHA/AATS/ACR/ASA/SCA/SCAI/SIR/STS/SVM Guidelines for the Diagnosis and Management of Patients With Thoracic Aortic Disease. Circulation. 2010; 121: L275-T700. Aortic aneurysm NOS (ICD10-I71.9) 4. Three-vessel coronary atherosclerosis. Aortic Atherosclerosis (ICD10-I70.0). Electronically Signed   By: Ilona Sorrel M.D.   On: 12/17/2018 07:28   Ct Cervical Spine Wo Contrast  Result Date: 12/17/2018 CLINICAL DATA:  Fall EXAM: CT HEAD WITHOUT CONTRAST CT CERVICAL SPINE WITHOUT CONTRAST TECHNIQUE: Multidetector CT imaging of the head and cervical spine was performed following the standard protocol without intravenous contrast. Multiplanar CT image reconstructions of the cervical spine were also generated. COMPARISON:  CT head 10/25/2016  FINDINGS: CT HEAD FINDINGS Brain: Mild atrophy.  Negative for  acute infarct, hemorrhage, mass. Vascular: Negative for hyperdense vessel Skull: Negative for skull fracture Sinuses/Orbits: Mild mucosal edema paranasal sinuses. Right mastoid effusion. Bilateral cataract surgery Other: None CT CERVICAL SPINE FINDINGS Alignment: 2 mm anterolisthesis C4-5 and C7-T1 Skull base and vertebrae: Negative for fracture Soft tissues and spinal canal: Negative for mass or soft tissue swelling. Carotid artery calcification bilaterally. Disc levels: Multilevel disc and facet degeneration throughout the cervical spine. Disc degeneration and spurring causing foraminal stenosis bilaterally Upper chest: Right apical circumferential pleural thickening unchanged from 04/18/2018. Other: None IMPRESSION: 1. No acute intracranial abnormality 2. Negative for cervical spine fracture. Multilevel degenerative change. Electronically Signed   By: Franchot Gallo M.D.   On: 12/17/2018 07:32   Ct Thoracic Spine Wo Contrast  Result Date: 12/17/2018 CLINICAL DATA:  Fall.  Back pain EXAM: CT THORACIC SPINE WITHOUT CONTRAST TECHNIQUE: Multidetector CT images of the thoracic were obtained using the standard protocol without intravenous contrast. COMPARISON:  CT chest 04/18/2018 FINDINGS: Alignment: Mild anterolisthesis T1-2, T2-3, T3-4. Accentuated dorsal kyphosis. Vertebrae: Negative for fracture or Paraspinal and other soft tissues: Advanced atherosclerotic thoracic aorta. Aortic valve replacement. COPD with bronchial thickening. Right middle lobe scarring and postoperative change. 6 mm left lower lobe nodule stable from prior studies and appearing benign. Disc levels: Multilevel disc degeneration throughout the thoracic spine. No significant spinal stenosis. IMPRESSION: Negative for fracture Electronically Signed   By: Franchot Gallo M.D.   On: 12/17/2018 07:23    EKG: Independently reviewed.  Sinus rhythm with nonspecific ST-T changes similar  to prior EKG     Assessment and Plan:   1.  Mechanical fall, right-sided rib fractures: Pain control with IV fentanyl and lidocaine patch.  Resume tramadol for moderate pain.  Watch for delirium/sedation in this elderly female requiring high doses of pain medications.  Check orthostatic blood pressure when more stable and awake to ambulate.  PT evaluation.  No evidence of pneumothorax on CT chest but not sure if trace right pleural effusion could represent mild hemothorax in the setting of anticoagulation/rib fractures.  Monitor hemoglobin and respiratory status.  Resume anticoagulation in a.m. if low suspicion for hemothorax.  2.  Hypotension/AKI: Present on admission.  Hold  Lasix and other antihypertensives especially Norvasc.  IV hydration.  Not sure if she has underlying orthostatic hypotension as she denies any dizzy spells at baseline.  Labs in a.m.  3.  Chronic atrial fibrillation: Resume amiodarone, diltiazem with holding parameters.  Will hold metoprolol.  Resume anticoagulation in a.m. as described in problem #1.  Status post pacemaker for pauses per patient report.  4.  Acute versus chronic hypoxic respiratory failure: Patient requiring 2 L nasal cannula to maintain O2 sat greater than 92%.  Could be related to splinting/rib fractures.  Incentive spirometry.  She does have a history of lung CA status post surgery and imaging studies show chronic scarring in the right apex.  Not sure if she has hypoxia at baseline.  5.  History of lung CA, squamous cell T2 N0: Status post surgery/lobectomy right middle lobe  6.  Right shoulder pain: No acute fractures on imaging.  Pain management.  7.  Leukocytosis:?  Reactive.  Denies any upper respiratory symptoms or dysuria.  No evidence of infiltrates on chest x-ray or CT chest.  No report of diarrhea.  Repeat labs in a.m.  Afebrile.  8. Thoracic aortic aneurysm: Appears stable  DVT prophylaxis: On chronic anticoagulation  Code Status: Full  code  Family Communication: Discussed with patient. Health care proxy would be her son Consults called: None Admission status:  Patient admitted as observation as anticipated LOS less than 2 midnights    Guilford Shi MD [facility] Pager [phone]  If 7PM-7AM, please contact night-coverage [email or web address] Password [facility]  12/17/2018, 2:34 PM

## 2018-12-17 NOTE — ED Notes (Signed)
Patient transported to X-ray 

## 2018-12-17 NOTE — ED Notes (Signed)
ED TO INPATIENT HANDOFF REPORT  Name/Age/Gender Laurie [name] 84 y.o. female  Code Status    Code Status Orders  (From admission, onward)         Start     Ordered   12/17/18 1241  Full code  Continuous     12/17/18 1245        Code Status History    Date Active Date Inactive Code Status Order ID Comments User Context   12/05/2018 1727 12/06/2018 1609 Full Code [id number]  Evans Lance, MD Inpatient   03/09/2016 0041 03/14/2016 1853 DNR [id number]  Ivor Costa, MD ED   01/12/2016 1551 01/16/2016 1633 Full Code [id number]  Bonnielee Haff, MD Inpatient      Home/SNF/Other Home  Chief Complaint Fall  Level of Care/Admitting Diagnosis ED Disposition    ED Disposition Condition [place]: [facility] [[id number]]  Level of Care: Medical Telemetry [104]  I expect the patient will be discharged within 24 hours: No (not a candidate for 5C-Observation unit)  Diagnosis: Fall [290176]  Admitting Physician: Guilford Shi [[id number]]  Attending Physician: Guilford Shi [phone]  PT Class (Do Not Modify): Observation [104]  PT Acc Code (Do Not Modify): Observation [10022]       Medical History Past Medical History:  Diagnosis Date  . AAA (abdominal aortic aneurysm) ([place])   . Anemia    iron deficiency  . Ascending aorta dilatation (H[facility])   . Atrial fibrillation ([place])    post op  . CAD (coronary artery disease)   . Carcinoma of lung ([place]) 08/28/2008   T2N0 moderately diff. squamous cell CA  . Cerebrovascular disease   . COPD (chronic obstructive pulmonary disease) ([place]) 2005   on CXR with R apical scaring  . Headache(784.0)    Hx: of migraines in the past  . Hypercholesterolemia    Hx: of  . Hypertension   . Incidental pulmonary nodule, > 53mm and < 33mm    LLL  . S/P aortic valve replacement 08/28/2008   owen  . S/P lobectomy of lung 08/28/2008   rt middle lobe  dr. Roxy Manns  . Shingles 11-09    Allergies Allergies   Allergen Reactions  . Hyoscyamine Other (See Comments)    Mental status changes.  (as of 02/13/16 patient denies any problems with this medication)  . Rofecoxib Anaphylaxis and Hives  . Statins Other (See Comments)    Muscle aches  . Metoprolol Other (See Comments)    Extreme fatigue, weakness and brady    IV Location/Drains/Wounds Patient Lines/Drains/Airways Status   Active Line/Drains/Airways    Name:   Placement date:   Placement time:   Site:   Days:   Peripheral IV 12/17/18 Left Hand   12/17/18    0552    Hand   less than 1   Incision (Closed) 12/05/18 Chest Left;Upper   12/05/18    -     12          Labs/Imaging Results for orders placed or performed during the hospital encounter of 12/17/18 (from the past 48 hour(s))  CBC with Differential     Status: Abnormal   Collection Time: 12/17/18  7:45 AM  Result Value Ref Range   WBC 22.4 (H) 4.0 - 10.5 K/uL   RBC 5.09 3.87 - 5.11 MIL/uL   Hemoglobin 14.3 12.0 - 15.0 g/dL   HCT 46.1 (H) 36.0 - 46.0 %   MCV 90.6 80.0 - 100.0 fL  MCH 28.1 26.0 - 34.0 pg   MCHC 31.0 30.0 - 36.0 g/dL   RDW 13.0 11.5 - 15.5 %   Platelets 269 150 - 400 K/uL   nRBC 0.0 0.0 - 0.2 %   Neutrophils Relative % 86 %   Neutro Abs 19.4 (H) 1.7 - 7.7 K/uL   Lymphocytes Relative 3 %   Lymphs Abs 0.6 (L) 0.7 - 4.0 K/uL   Monocytes Relative 10 %   Monocytes Absolute 2.2 (H) 0.1 - 1.0 K/uL   Eosinophils Relative 0 %   Eosinophils Absolute 0.0 0.0 - 0.5 K/uL   Basophils Relative 0 %   Basophils Absolute 0.1 0.0 - 0.1 K/uL   Immature Granulocytes 1 %   Abs Immature Granulocytes 0.19 (H) 0.00 - 0.07 K/uL    Comment: Performed at [facility] [address], [place], [place] [address]  Comprehensive metabolic panel     Status: Abnormal   Collection Time: 12/17/18  7:45 AM  Result Value Ref Range   Sodium 133 (L) 135 - 145 mmol/L   Potassium 4.1 3.5 - 5.1 mmol/L   Chloride 98 98 - 111 mmol/L   CO2 20 (L) 22 - 32 mmol/L   Glucose, Bld 159 (H)  70 - 99 mg/dL   BUN 12 8 - 23 mg/dL   Creatinine, Ser 1.62 (H) 0.44 - 1.00 mg/dL   Calcium 9.1 8.9 - 10.3 mg/dL   Total Protein 5.3 (L) 6.5 - 8.1 g/dL   Albumin 3.0 (L) 3.5 - 5.0 g/dL   AST 44 (H) 15 - 41 U/L   ALT 33 0 - 44 U/L   Alkaline Phosphatase 71 38 - 126 U/L   Total Bilirubin 0.7 0.3 - 1.2 mg/dL   GFR calc non Af Amer 29 (L) >60 mL/min   GFR calc Af Amer 34 (L) >60 mL/min   Anion gap 15 5 - 15    Comment: Performed at [facility], [place] [address] [place], [place] [address]  Urinalysis, Routine w reflex microscopic     Status: Abnormal   Collection Time: 12/17/18 10:57 AM  Result Value Ref Range   Color, Urine YELLOW (A) YELLOW    Comment: BIOCHEMICALS MAY BE AFFECTED BY COLOR CORRECTED ON 01/18 AT 1143: PREVIOUSLY REPORTED AS YELLOW    APPearance CLOUDY (A) CLEAR   Specific Gravity, Urine 1.015 1.005 - 1.030   pH 6.0 5.0 - 8.0   Glucose, UA NEGATIVE NEGATIVE mg/dL   Hgb urine dipstick NEGATIVE NEGATIVE   Bilirubin Urine SMALL (A) NEGATIVE   Ketones, ur TRACE (A) NEGATIVE mg/dL   Protein, ur 100 (A) NEGATIVE mg/dL   Nitrite NEGATIVE NEGATIVE   Leukocytes, UA NEGATIVE NEGATIVE    Comment: Performed at [facility] [address] [place], [place] [address]  Urinalysis, Microscopic (reflex)     Status: Abnormal   Collection Time: 12/17/18 10:57 AM  Result Value Ref Range   RBC / HPF NONE SEEN 0 - 5 RBC/hpf   WBC, UA 6-10 0 - 5 WBC/hpf   Bacteria, UA FEW (A) NONE SEEN   Squamous Epithelial / LPF 6-10 0 - 5    Comment: Performed at [facility], [place] [address] [place], [place] [address]   Dg Shoulder Right  Result Date: 12/17/2018 CLINICAL DATA:  Status post fall.  Right shoulder pain. EXAM: RIGHT SHOULDER - 2+ VIEW COMPARISON:  None. FINDINGS: There is no fracture or dislocation. The glenohumeral joint is normal.  There are mild degenerative changes of the acromioclavicular joint. IMPRESSION: No acute osseous injury of the left shoulder.  Electronically Signed   By: Kathreen Devoid   On: 12/17/2018 09:59   Ct Head Wo Contrast  Result Date: 12/17/2018 CLINICAL DATA:  Fall EXAM: CT HEAD WITHOUT CONTRAST CT CERVICAL SPINE WITHOUT CONTRAST TECHNIQUE: Multidetector CT imaging of the head and cervical spine was performed following the standard protocol without intravenous contrast. Multiplanar CT image reconstructions of the cervical spine were also generated. COMPARISON:  CT head 10/25/2016 FINDINGS: CT HEAD FINDINGS Brain: Mild atrophy.  Negative for  acute infarct, hemorrhage, mass. Vascular: Negative for hyperdense vessel Skull: Negative for skull fracture Sinuses/Orbits: Mild mucosal edema paranasal sinuses. Right mastoid effusion. Bilateral cataract surgery Other: None CT CERVICAL SPINE FINDINGS Alignment: 2 mm anterolisthesis C4-5 and C7-T1 Skull base and vertebrae: Negative for fracture Soft tissues and spinal canal: Negative for mass or soft tissue swelling. Carotid artery calcification bilaterally. Disc levels: Multilevel disc and facet degeneration throughout the cervical spine. Disc degeneration and spurring causing foraminal stenosis bilaterally Upper chest: Right apical circumferential pleural thickening unchanged from 04/18/2018. Other: None IMPRESSION: 1. No acute intracranial abnormality 2. Negative for cervical spine fracture. Multilevel degenerative change. Electronically Signed   By: Franchot Gallo M.D.   On: 12/17/2018 07:32   Ct Chest Wo Contrast  Result Date: 12/17/2018 CLINICAL DATA:  Fall with right chest wall pain. History of right middle lobectomy for lung cancer in 2009. EXAM: CT CHEST WITHOUT CONTRAST TECHNIQUE: Multidetector CT imaging of the chest was performed following the standard protocol without IV contrast. COMPARISON:  12/06/2018 chest radiograph. 04/18/2018 chest CT. FINDINGS: Cardiovascular: Normal heart size. No significant pericardial effusion/thickening. Aortic valve prosthesis is in place. Three-vessel  coronary atherosclerosis. Atherosclerotic thoracic aorta with stable 4.6 cm ascending thoracic aortic aneurysm. Stable ectatic lower descending thoracic aorta. No evidence of acute intramural hematoma in the thoracic aorta. Top-normal caliber main pulmonary artery (3.1 cm diameter). Mediastinum/Nodes: No discrete thyroid nodules. Unremarkable esophagus. No pathologically enlarged axillary, mediastinal or hilar lymph nodes, noting limited sensitivity for the detection of hilar adenopathy on this noncontrast study. Lungs/Pleura: No pneumothorax. Trace basilar right pleural effusion. Stable posterior right pleural thickening with scattered pleural calcification. No left pleural effusion. Status post right middle lobectomy. Parenchymal bands throughout the basilar right lower lobe. No acute consolidative airspace disease or lung masses. A few scattered solid pulmonary nodules in both lungs, largest 7 mm in posterior left lower lobe (series 6/image 124), all stable since 03/22/2017 chest CT, considered benign. No new significant pulmonary nodules. Upper abdomen: Scattered pneumobilia in left liver lobe, unchanged. Musculoskeletal: No aggressive appearing focal osseous lesions. Intact sternotomy wires. Acute posterolateral right sixth and seventh rib fractures. IMPRESSION: 1. Acute posterolateral right sixth and seventh rib fractures. No pneumothorax. Trace basilar right pleural effusion. 2. Chronic pleural-parenchymal scarring in the lower right chest. 3. Stable 4.6 cm ascending thoracic aortic aneurysm. Recommend semi-annual imaging followup by CTA or MRA and referral to cardiothoracic surgery if not already obtained. This recommendation follows 2010 ACCF/AHA/AATS/ACR/ASA/SCA/SCAI/SIR/STS/SVM Guidelines for the Diagnosis and Management of Patients With Thoracic Aortic Disease. Circulation. 2010; 121: T465-K812. Aortic aneurysm NOS (ICD10-I71.9) 4. Three-vessel coronary atherosclerosis. Aortic Atherosclerosis  (ICD10-I70.0). Electronically Signed   By: Ilona Sorrel M.D.   On: 12/17/2018 07:28   Ct Cervical Spine Wo Contrast  Result Date: 12/17/2018 CLINICAL DATA:  Fall EXAM: CT HEAD WITHOUT CONTRAST CT CERVICAL SPINE WITHOUT CONTRAST TECHNIQUE: Multidetector CT imaging of the head and cervical spine  was performed following the standard protocol without intravenous contrast. Multiplanar CT image reconstructions of the cervical spine were also generated. COMPARISON:  CT head 10/25/2016 FINDINGS: CT HEAD FINDINGS Brain: Mild atrophy.  Negative for  acute infarct, hemorrhage, mass. Vascular: Negative for hyperdense vessel Skull: Negative for skull fracture Sinuses/Orbits: Mild mucosal edema paranasal sinuses. Right mastoid effusion. Bilateral cataract surgery Other: None CT CERVICAL SPINE FINDINGS Alignment: 2 mm anterolisthesis C4-5 and C7-T1 Skull base and vertebrae: Negative for fracture Soft tissues and spinal canal: Negative for mass or soft tissue swelling. Carotid artery calcification bilaterally. Disc levels: Multilevel disc and facet degeneration throughout the cervical spine. Disc degeneration and spurring causing foraminal stenosis bilaterally Upper chest: Right apical circumferential pleural thickening unchanged from 04/18/2018. Other: None IMPRESSION: 1. No acute intracranial abnormality 2. Negative for cervical spine fracture. Multilevel degenerative change. Electronically Signed   By: Franchot Gallo M.D.   On: 12/17/2018 07:32   Ct Thoracic Spine Wo Contrast  Result Date: 12/17/2018 CLINICAL DATA:  Fall.  Back pain EXAM: CT THORACIC SPINE WITHOUT CONTRAST TECHNIQUE: Multidetector CT images of the thoracic were obtained using the standard protocol without intravenous contrast. COMPARISON:  CT chest 04/18/2018 FINDINGS: Alignment: Mild anterolisthesis T1-2, T2-3, T3-4. Accentuated dorsal kyphosis. Vertebrae: Negative for fracture or Paraspinal and other soft tissues: Advanced atherosclerotic thoracic  aorta. Aortic valve replacement. COPD with bronchial thickening. Right middle lobe scarring and postoperative change. 6 mm left lower lobe nodule stable from prior studies and appearing benign. Disc levels: Multilevel disc degeneration throughout the thoracic spine. No significant spinal stenosis. IMPRESSION: Negative for fracture Electronically Signed   By: Franchot Gallo M.D.   On: 12/17/2018 07:23   EKG Interpretation  Date/Time:  Saturday December 17 2018 07:30:32 EST Ventricular Rate:  60 PR Interval:    QRS Duration: 101 QT Interval:  497 QTC Calculation: 497 R Axis:   56 Text Interpretation:  Sinus rhythm Nonspecific repol abnormality, diffuse leads No significant change since last tracing Confirmed by Pryor Curia [phone]) on 12/17/2018 7:37:15 AM   Pending Labs Unresulted Labs (From admission, onward)    Start     Ordered   12/18/18 5400  Basic metabolic panel  Tomorrow morning,   R     12/17/18 1245   12/18/18 0500  CBC  Tomorrow morning,   R     12/17/18 1245          Vitals/Pain Today's Vitals   12/17/18 1200 12/17/18 1215 12/17/18 1230 12/17/18 1246  BP: (!) 108/48 (!) 106/48 (!) 103/48 (!) 115/47  Pulse: (!) 59 (!) 59 60 60  Resp: 17 17 16 19   Temp:      TempSrc:      SpO2: 92% (!) 87% 92% 91%  PainSc:        Isolation Precautions No active isolations  Medications Medications  traMADol (ULTRAM) tablet 50 mg (has no administration in time range)  amiodarone (PACERONE) tablet 200 mg (has no administration in time range)  diltiazem (CARDIZEM CD) 24 hr capsule 180 mg (has no administration in time range)  ALPRAZolam (XANAX) tablet 0.25 mg (has no administration in time range)  docusate sodium (COLACE) capsule 100 mg (has no administration in time range)  polyethylene glycol powder (GLYCOLAX/MIRALAX) container 17 g (has no administration in time range)  b complex vitamins tablet 1 tablet (has no administration in time range)  albuterol (PROVENTIL) (2.5 MG/3ML)  0.083% nebulizer solution 2.5 mg (has no administration in time range)  rivaroxaban (XARELTO) tablet 20 mg (  has no administration in time range)  0.9 %  sodium chloride infusion (has no administration in time range)  acetaminophen (TYLENOL) tablet 650 mg (has no administration in time range)    Or  acetaminophen (TYLENOL) suppository 650 mg (has no administration in time range)  ondansetron (ZOFRAN) tablet 4 mg (has no administration in time range)    Or  ondansetron (ZOFRAN) injection 4 mg (has no administration in time range)  fentaNYL (SUBLIMAZE) injection 25 mcg (has no administration in time range)  lidocaine (LIDODERM) 5 % 1 patch (has no administration in time range)  fentaNYL (SUBLIMAZE) injection 50 mcg (50 mcg Intravenous Given 12/17/18 0753)  ondansetron (ZOFRAN) injection 4 mg (4 mg Intravenous Given 12/17/18 0754)  sodium chloride 0.9 % bolus 1,000 mL (0 mLs Intravenous Stopped 12/17/18 0840)    Mobility walks with device

## 2018-12-17 NOTE — ED Provider Notes (Addendum)
TIME SEEN: 6:08 AM  CHIEF COMPLAINT: Fall  HPI: Patient is an 84 year old female with history of aortic aneurysm, paroxysmal atrial fibrillation, COPD, hypertension, hyperlipidemia, aortic valve replacement on Xarelto who presents to the emergency department with a fall.  States that she got up out of bed and her feet slipped out from under her and she fell onto her right side.  Not sure if she hit her head.  No loss of consciousness.  Complaining of lower neck pain, thoracic back pain, right shoulder pain, right lateral rib pain.  No shortness of breath, abdominal pain, numbness, tingling or focal weakness.  Blood pressure noted to be low here in the emergency department.  No recent fevers, cough, vomiting or diarrhea.  Patient lives at home alone.  She does not use a cane or walker to ambulate.  ROS: See HPI Constitutional: no fever  Eyes: no drainage  ENT: no runny nose   Cardiovascular: Right lateral chest pain  Resp: no SOB  GI: no vomiting GU: no dysuria Integumentary: no rash  Allergy: no hives  Musculoskeletal: no leg swelling  Neurological: no slurred speech ROS otherwise negative  PAST MEDICAL HISTORY/PAST SURGICAL HISTORY:  Past Medical History:  Diagnosis Date  . AAA (abdominal aortic aneurysm) ([place])   . Anemia    iron deficiency  . Ascending aorta dilatation ([facility])   . Atrial fibrillation ([place])    post op  . CAD (coronary artery disease)   . Carcinoma of lung ([place]) 08/28/2008   T2N0 moderately diff. squamous cell CA  . Cerebrovascular disease   . COPD (chronic obstructive pulmonary disease) ([place]) 2005   on CXR with R apical scaring  . Headache(784.0)    Hx: of migraines in the past  . Hypercholesterolemia    Hx: of  . Hypertension   . Incidental pulmonary nodule, > 53mm and < 79mm    LLL  . S/P aortic valve replacement 08/28/2008   owen  . S/P lobectomy of lung 08/28/2008   rt middle lobe  dr. Roxy Manns  . Shingles 11-09    MEDICATIONS:  Prior to Admission  medications   Medication Sig Start Date End Date Taking? Authorizing Provider  acetaminophen (TYLENOL) 500 MG tablet Take 500 mg by mouth daily as needed (pain).     [provider]  albuterol (PROVENTIL) (2.5 MG/3ML) 0.083% nebulizer solution Take 3 mLs (2.5 mg total) by nebulization every 4 (four) hours as needed for wheezing or shortness of breath. 12/01/18   Noe Gens, PA-C  ALPRAZolam (XANAX) 0.25 MG tablet TAKE 1-2 TABLETS (0.25MG  TO 0.5MG  TOTAL) BY MOUTH TWICE DAILY AS NEEDED Patient taking differently: Take 0.25 mg by mouth 2 (two) times daily as needed for anxiety or sleep.  11/18/18   Hali Marry, MD  amiodarone (PACERONE) 200 MG tablet TAKE 1 TABLET BY MOUTH EVERY DAY Patient taking differently: Take 200 mg by mouth daily before breakfast.  06/07/18   Lelon Perla, MD  amLODipine (NORVASC) 5 MG tablet Take 1 tablet (5 mg total) by mouth daily. Patient taking differently: Take 5 mg by mouth daily after supper.  05/10/18   Lelon Perla, MD  b complex vitamins tablet Take 1 tablet by mouth daily with supper.    [provider]  diltiazem (CARDIZEM CD) 180 MG 24 hr capsule Take 1 capsule (180 mg total) by mouth daily for 30 days. 12/12/18 01/11/19  Evans Lance, MD  Docusate Sodium (STOOL SOFTENER LAXATIVE PO) Take 1 capsule by  mouth at bedtime as needed (constipation).     [provider]  furosemide (LASIX) 20 MG tablet Take 1 tablet (20 mg total) by mouth daily. 06/30/18 02/03/19  Lelon Perla, MD  lisinopril (PRINIVIL,ZESTRIL) 40 MG tablet TAKE 1 TABLET BY MOUTH EVERY DAY Patient taking differently: Take 40 mg by mouth daily before breakfast.  11/07/18   Lelon Perla, MD  polyethylene glycol powder (GLYCOLAX/MIRALAX) powder Take 17 g by mouth daily. Patient taking differently: Take 17 g by mouth daily as needed (constipation).  08/11/18   Doran Stabler, MD  traMADol (ULTRAM) 50 MG tablet TAKE 1 TABLET BY MOUTH THREE TIMES A DAY AS  NEEDED FOR PAIN Patient taking differently: Take 50 mg by mouth daily as needed for severe pain.  09/15/18   Hali Marry, MD  XARELTO 20 MG TABS tablet TAKE 1 TABLET (20 MG TOTAL) BY MOUTH DAILY WITH SUPPER. Patient taking differently: Take 20 mg by mouth daily after supper. High risk med: Anticoagulant.  Crushed Xarelto can be given down a G-tube but NOT a J-Tube. 11/17/18   Lelon Perla, MD    ALLERGIES:  Allergies  Allergen Reactions  . Hyoscyamine Other (See Comments)    Mental status changes.  (as of 02/13/16 patient denies any problems with this medication)  . Rofecoxib Anaphylaxis and Hives  . Statins Other (See Comments)    Muscle aches  . Metoprolol Other (See Comments)    Extreme fatigue, weakness and brady    SOCIAL HISTORY:  Social History   Tobacco Use  . Smoking status: Former Smoker    Years: 40.00  . Smokeless tobacco: Never Used  Substance Use Topics  . Alcohol use: No    Alcohol/week: 0.0 standard drinks    FAMILY HISTORY: Family History  Problem Relation Age of Onset  . Stroke Father   . Diabetes Son        brittle diabetes  . Colon polyps Neg Hx   . Colon cancer Neg Hx   . Esophageal cancer Neg Hx   . Stomach cancer Neg Hx   . Pancreatic cancer Neg Hx   . Kidney disease Neg Hx   . Liver disease Neg Hx     EXAM: BP (!) 152/127 (BP Location: Left Arm)   Pulse 65   Temp 98.4 F (36.9 C) (Oral)   Resp 20   SpO2 98%  CONSTITUTIONAL: Alert and oriented and responds appropriately to questions.  Elderly, appears uncomfortable HEAD: Normocephalic; atraumatic EYES: Conjunctivae clear, PERRL, EOMI ENT: normal nose; no rhinorrhea; dry mucous membranes; pharynx without lesions noted; no dental injury; no septal hematoma NECK: Supple, no meningismus, no LAD; no midline step-off or deformity but patient is tender to palpation over the lower cervical spine CARD: RRR; S1 and S2 appreciated; no murmurs, no clicks, no rubs, no gallops RESP:  Normal chest excursion without splinting or tachypnea; breath sounds clear and equal bilaterally; no wheezes, no rhonchi, no rales; no hypoxia or respiratory distress CHEST:  chest wall stable, no crepitus or ecchymosis or deformity, or to palpation over the right lateral ribs no flail chest ABD/GI: Normal bowel sounds; non-distended; soft, non-tender, no rebound, no guarding; no ecchymosis or other lesions noted PELVIS:  stable, nontender to palpation BACK:  The back appears normal and is to palpation over the upper thoracic spine without step-off or deformity, no lumbar spine tenderness  EXT: Patient is tender to palpation over the right scapula without deformity, swelling or ecchymosis.  Also tender to palpation over the lateral right shoulder without loss of fullness or deformity.  No tenderness over the rest of the right arm.  She has 2+ radial and DP pulses bilaterally.  No tenderness in the left upper extremity or bilateral lower extremities. SKIN: Normal color for age and race; warm NEURO: Moves all extremities equally normal sensation diffusely PSYCH: The patient's mood and manner are appropriate. Grooming and personal hygiene are appropriate.  MEDICAL DECISION MAKING: Patient is an 84 year old female who had a mechanical fall at home.  Will obtain imaging of head, neck, thoracic spine, chest, right shoulder.  Found to be hypotensive here.  No infectious symptoms.  No history of bleeding.  Is on Xarelto.  Will obtain labs, urine and give IV fluids.  Will give pain and nausea medicine.  ED PROGRESS: CT scan show 2 rib fractures with no pneumothorax.  She has known history of thoracic aortic aneurysm.  Labs, urine, x-ray of the right shoulder pending.  Signed out to Dr. Zenia Resides to follow-up on patient's work-up for further disposition.  On reevaluation, patient reports her pain is improved.  Her sats are 86% on room air at rest.  I suspect this is from COPD and splinting.  She does not wear oxygen  chronically.  She will need admission for her new oxygen requirement and for pulmonary toilet for her to rib fractures.  I have ordered incentive spirometry.  Blood pressure is improving with IV hydration.  Again she denies any recent bleeding, infectious symptoms.  Afebrile here.  Rectal temperature was 99.3.  Labs, urine pending.   I reviewed all nursing notes, vitals, pertinent previous records, EKGs, lab and urine results, imaging (as available).      EKG Interpretation  Date/Time:  Saturday December 17 2018 07:30:32 EST Ventricular Rate:  60 PR Interval:    QRS Duration: 101 QT Interval:  497 QTC Calculation: 497 R Axis:   56 Text Interpretation:  Sinus rhythm Nonspecific repol abnormality, diffuse leads No significant change since last tracing Confirmed by Pryor Curia [id number]) on 12/17/2018 7:37:15 AM        CRITICAL CARE Performed by: Cyril Mourning Shanterria Franta   Total critical care time: 45 minutes  Critical care time was exclusive of separately billable procedures and treating other patients.  Critical care was necessary to treat or prevent imminent or life-threatening deterioration.  Critical care was time spent personally by me on the following activities: development of treatment plan with patient and/or surrogate as well as nursing, discussions with consultants, evaluation of patient's response to treatment, examination of patient, obtaining history from patient or surrogate, ordering and performing treatments and interventions, ordering and review of laboratory studies, ordering and review of radiographic studies, pulse oximetry and re-evaluation of patient's condition.     Ulysee Fyock, Delice Bison, DO 12/17/18 [place], Delice Bison, DO 12/17/18 [phone]

## 2018-12-17 NOTE — ED Triage Notes (Signed)
  Patient BIB EMS after mechanical fall at home.  Patient states she was getting up to go to the bathroom and lost her footing and fell on her R shoulder.  Patient states she did not hit her head.  Patient recently had pacemaker placed two weeks ago.  Patient is complaining of 10/10 R shoulder pain.  Patient states she is also constipated.  Patient is A&O x4.

## 2018-12-18 DIAGNOSIS — I482 Chronic atrial fibrillation, unspecified: Secondary | ICD-10-CM

## 2018-12-18 DIAGNOSIS — Z7901 Long term (current) use of anticoagulants: Secondary | ICD-10-CM

## 2018-12-18 LAB — BASIC METABOLIC PANEL
Anion gap: 8 (ref 5–15)
BUN: 12 mg/dL (ref 8–23)
CO2: 26 mmol/L (ref 22–32)
CREATININE: 1.18 mg/dL — AB (ref 0.44–1.00)
Calcium: 8.4 mg/dL — ABNORMAL LOW (ref 8.9–10.3)
Chloride: 97 mmol/L — ABNORMAL LOW (ref 98–111)
GFR calc non Af Amer: 43 mL/min — ABNORMAL LOW (ref >60)
GFR, EST AFRICAN AMERICAN: 50 mL/min — AB (ref >60)
Glucose, Bld: 102 mg/dL — ABNORMAL HIGH (ref 70–99)
Potassium: 4.5 mmol/L (ref 3.5–5.1)
SODIUM: 131 mmol/L — AB (ref 135–145)

## 2018-12-18 LAB — CBC
HCT: 37.5 % (ref 36.0–46.0)
Hemoglobin: 12 g/dL (ref 12.0–15.0)
MCH: 28.1 pg (ref 26.0–34.0)
MCHC: 32 g/dL (ref 30.0–36.0)
MCV: 87.8 fL (ref 80.0–100.0)
Platelets: 194 10*3/uL (ref 150–400)
RBC: 4.27 MIL/uL (ref 3.87–5.11)
RDW: 13.3 % (ref 11.5–15.5)
WBC: 12.7 10*3/uL — ABNORMAL HIGH (ref 4.0–10.5)
nRBC: 0 % (ref 0.0–0.2)

## 2018-12-18 LAB — HEMOGLOBIN AND HEMATOCRIT, BLOOD
HCT: 39.1 % (ref 36.0–46.0)
Hemoglobin: 12.5 g/dL (ref 12.0–15.0)

## 2018-12-18 MED ORDER — ACETAMINOPHEN 325 MG PO TABS
650.0000 mg | ORAL_TABLET | ORAL | Status: DC | PRN
  Administered 2018-12-18 (×2): 650 mg via ORAL

## 2018-12-18 NOTE — Evaluation (Addendum)
Occupational Therapy Evaluation Patient Details Name: Laurie [name] MRN: [id number] DOB: [birth year] Today's Date: 12/18/2018    History of Present Illness 84 y.o. female with history h/o thoracic aortic aneurysm, paroxysmal atrial fibrillation, COPD, hypertension, hyperlipidemia, bovine aortic valve replacement on Xarelto who presents to the emergency department after a fall.   Clinical Impression   PTA, pt was living alone and was independent with ADLs and light IADLs including driving; pt's daughter-in-law brings prepared meals. Pt currently requiring Min-Mod A for UB ADLs, Mod-Max A for LB ADLs, and Min A +2 for functional transfers. Pt with decreased balance, strength, and activity tolerance due to pain. During session, SpO2 dropping to 84% on RA; placing pt on 2L O2 and SpO2 stayed in low 90s. Pt would benefit from further acute OT to facilitate safe dc. Recommend dc to SNF for further OT to optimize safety, independence with ADLs, and return to PLOF.      Follow Up Recommendations  SNF;Supervision/Assistance - 24 hour    Equipment Recommendations  Other (comment)(Defer to next venue)    Recommendations for Other Services PT consult     Precautions / Restrictions Precautions Precautions: Other (comment) Precaution Comments: Right rib fx Restrictions Weight Bearing Restrictions: Yes LUE Weight Bearing: Non weight bearing Other Position/Activity Restrictions: Per MD note, right shoulder no fxs      Mobility Bed Mobility Overal bed mobility: Needs Assistance Bed Mobility: Rolling;Sidelying to Sit Rolling: Min assist Sidelying to sit: Mod assist;HOB elevated       General bed mobility comments: Min A for rolling and then Mod A to elevate trunk into upright posture. Limited by pain  Transfers Overall transfer level: Needs assistance Equipment used: 2 person hand held assist Transfers: Sit to/from Stand Sit to Stand: Min assist;+2 physical assistance;+2  safety/equipment         General transfer comment: Min A +2 for power up into standing from EOB and BSC.     Balance Overall balance assessment: Needs assistance Sitting-balance support: No upper extremity supported;Feet supported Sitting balance-Leahy Scale: Fair     Standing balance support: Bilateral upper extremity supported;During functional activity Standing balance-Leahy Scale: Poor Standing balance comment: Reliant on UE support                           ADL either performed or assessed with clinical judgement   ADL Overall ADL's : Needs assistance/impaired Eating/Feeding: Independent;Sitting   Grooming: Wash/dry hands;Supervision/safety;Sitting   Upper Body Bathing: Minimal assistance;Sitting   Lower Body Bathing: Moderate assistance;Sit to/from stand   Upper Body Dressing : Moderate assistance;Sitting   Lower Body Dressing: Maximal assistance;Sit to/from stand Lower Body Dressing Details (indicate cue type and reason): Max A for donning socks as pt with pain during forward bending.  Toilet Transfer: Minimal assistance;+2 for physical assistance;Stand-pivot;BSC Toilet Transfer Details (indicate cue type and reason): Min A for power up into standing. Min A for maintaining balance with two person hand held A Toileting- Clothing Manipulation and Hygiene: Minimal assistance;+2 for safety/equipment;Sit to/from stand Toileting - Clothing Manipulation Details (indicate cue type and reason): Min A for posterior lean and maintaining balance. Pt performing peri care after urination     Functional mobility during ADLs: Minimal assistance;+2 for physical assistance(short distance) General ADL Comments: Pt presenting with decreased balance, ROM at RUE, and decreasedc activity tolerance due to pain     Vision Baseline Vision/History: Wears glasses Wears Glasses: Reading only Patient Visual Report: No change from baseline  Perception     Praxis       Pertinent Vitals/Pain Pain Assessment: Faces Faces Pain Scale: Hurts even more Pain Location: Right ribs >  right shoulder Pain Descriptors / Indicators: Constant;Discomfort;Grimacing;Guarding;Sharp Pain Intervention(s): Monitored during session;Limited activity within patient's tolerance;Repositioned     Hand Dominance Right   Extremity/Trunk Assessment Upper Extremity Assessment Upper Extremity Assessment: RUE deficits/detail RUE Deficits / Details: Shoulder pain with ROM. No acute fractures on imaging. Able to perform AAROM 0-130* RUE: Unable to fully assess due to pain RUE Coordination: decreased gross motor   Lower Extremity Assessment Lower Extremity Assessment: Defer to PT evaluation   Cervical / Trunk Assessment Cervical / Trunk Assessment: Other exceptions Cervical / Trunk Exceptions: Right rib fxs   Communication Communication Communication: No difficulties   Cognition Arousal/Alertness: Awake/alert Behavior During Therapy: WFL for tasks assessed/performed Overall Cognitive Status: Within Functional Limits for tasks assessed                                     General Comments  SpO2 droppign to 84% on RA during bed mobility. Placed on 2L O2 and she stayed 92-91 during session    Exercises     Shoulder Kraemer expects to be discharged to:: Private residence Living Arrangements: Alone Available Help at Discharge: Family Type of Home: House Home Access: Stairs to enter Technical brewer of Steps: 2 Entrance Stairs-Rails: Left Home Layout: One level     Bathroom Shower/Tub: Teacher, early years/pre: [place]: Environmental consultant - 2 wheels;Cane - single point          Prior Functioning/Environment Level of Independence: Independent        Comments: ADLs, IADLs, driving, and has a black lab. Daughter-in-law brings food over. Pt reports she loves to read        OT  Problem List: Decreased strength;Decreased range of motion;Impaired balance (sitting and/or standing);Decreased activity tolerance;Decreased knowledge of use of DME or AE;Decreased knowledge of precautions;Pain      OT Treatment/Interventions: Self-care/ADL training;Therapeutic exercise;Energy conservation;DME and/or AE instruction;Therapeutic activities;Patient/family education    OT Goals(Current goals can be found in the care plan section) Acute Rehab OT Goals Patient Stated Goal: "Return home to my dog" OT Goal Formulation: With patient Time For Goal Achievement: 01/01/19 Potential to Achieve Goals: Good  OT Frequency: Min 2X/week   Barriers to D/C: Decreased caregiver support  Lives alone. Unsure if son can stay at dc       Co-evaluation              AM-PAC OT "6 Clicks" Daily Activity     Outcome Measure Help from another person eating meals?: None Help from another person taking care of personal grooming?: A Little Help from another person toileting, which includes using toliet, bedpan, or urinal?: A Little Help from another person bathing (including washing, rinsing, drying)?: A Lot Help from another person to put on and taking off regular upper body clothing?: A Lot Help from another person to put on and taking off regular lower body clothing?: A Lot 6 Click Score: 16   End of Session Equipment Utilized During Treatment: Oxygen Nurse Communication: Mobility status;Other (comment)(SpO2)  Activity Tolerance: Patient tolerated treatment well Patient left: in chair;with call bell/phone within reach;with chair alarm set  OT Visit Diagnosis: Unsteadiness on feet (R26.81);Other abnormalities of gait  and mobility (R26.89);Muscle weakness (generalized) (M62.81);Pain Pain - Right/Left: Right Pain - part of body: (Ribs)                Time: 1324-4010 OT Time Calculation (min): 33 min Charges:  OT General Charges $OT Visit: 1 Visit OT Evaluation $OT Eval Moderate  Complexity: [place], OTR/L Acute Rehab Pager: [phone] Office: [place] 12/18/2018, 12:49 PM

## 2018-12-18 NOTE — Progress Notes (Signed)
PROGRESS NOTE    JENNIPHER [name]  LGX:[id number] DOB: 04-Jul-1935 DOA: 12/17/2018 PCP: Hali Marry, MD   Brief Narrative:  Laurie [name] is a 84 y.o. female with history h/o thoracic aortic aneurysm, paroxysmal atrial fibrillation, COPD, hypertension, hyperlipidemia, bovine aortic valve replacement on Xarelto who presents to the emergency department after a fall.  Patient lives alone and at baseline ambulates independently without the need for cane or walker.  She recalls going to bed feeling fine at 11 PM last night.  Little after midnight she felt the urge to use the bathroom and got out of her bed when her leg got trapped in the sheets/bed and she fell onto her right side.  She denies hitting her head but is complaining of right shoulder pain 6/10 as well as right lateral chest wall pain at 10/10.  Trauma work-up in the ED including CT cervical spine, thoracic spine, CT head negative for any acute injuries/findings.  CT chest however shows acute posterolateral sixth and seventh rib fractures with no pneumothorax.  Trace right pleural effusion and stable 4.6 cm thoracic aortic aneurysm reported.  On arrival, patient's blood pressure was apparently low in the 80s and receivedIV fluid bolus.Patient also received IV fentanyl 50 mcg and her blood pressure appears to be in the low 100s now and is rating her pain level at 8/10 along the ribs.  Additionally, patient was noted to be hypoxic requiring 2 L nasal cannula.  Lab work shows leukocytosis of 22,000, AKI with creatinine up to 1.6.  She is requested to be admitted for observation/pain management and possible splinting related hypoxia.Of note, patient underwent pacemaker placement 10 days back and her anticoagulation was held for a week prior to procedure.  Patient reports resuming anticoagulation on postop day 6.  She denies any signs of acute bleeding and lab work in the ED showed stable hemoglobin at 15.   Assessment & Plan:     Active Problems:   Fall   1.  Mechanical fall, right-sided rib fractures:  - stop IV fentanyl - start PO ibuprofen - lidocaine patch to area - tramadol resumed - PT eval ordered - no on oxygen currently - will restart anticoagulation today if afternoon H/H stable - slight decrease in H/H likely dilutional  2.  Hypotension/AKI:  - Present on admission - amiodarone given this am - patient reports occasional dizziness at home - PT eval ordered - improvement in BUN/ Cr this am   3.  Chronic atrial fibrillation:  - Resume amiodarone, diltiazem with holding parameters - consider resuming metoprolol if patient HR and BP continues to stay within normal limits - Status post pacemaker for pauses per patient report. - repeating H/H this afternoon - if H/H stable will restart anticoagulation  4.  Acute versus chronic hypoxic respiratory failure:  - Patient initially requiring 2 L nasal cannula to maintain O2 sat greater than 92% - Could be related to splinting/rib fractures - Incentive spirometry - holding IV fentanyl  5.  History of lung CA, squamous cell T2 N0: Status post surgery/lobectomy right middle lobe  6.  Right shoulder pain: No acute fractures on imaging.  Pain management.  7.  Leukocytosis:?  - improvement in WBC on morning labs - likely Reactive  8. Thoracic aortic aneurysm: Appears stable  DVT prophylaxis: On chronic anticoagulation  Code Status: Full code  Family Communication: Discussed with patient. Health care proxy would be her son Consults called: None Admission status:  Patient admitted as observation as  anticipated LOS less than 2 midnights    Procedures:   None  Antimicrobials:   None    Subjective: Patient reports feeling heart is beating out of her chest.  Denies chest pain.  States this is how her heart feels in atrial fibrillation.  Reports significant pain in chest at site of fractures.  No problems reported with eating and  drinking.  Lives alone.  Objective: Vitals:   12/17/18 1315 12/17/18 1400 12/17/18 2042 12/18/18 0526  BP: (!) 112/48 (!) 114/55 (!) 118/57 116/61  Pulse: 60 60 72 73  Resp: 17 18    Temp:  99.2 F (37.3 C) 98.2 F (36.8 C)   TempSrc:  Oral Oral Oral  SpO2: 91% 92% 91% 91%    Intake/Output Summary (Last 24 hours) at 12/18/2018 0905 Last data filed at 12/18/2018 5009 Gross per 24 hour  Intake 1150.74 ml  Output -  Net 1150.74 ml   There were no vitals filed for this visit.  Examination:  General exam: Appears calm and comfortable  Respiratory system: increased respiratory effort 2/2 pain, rales at bases of right ung, tenderness on palpation of right mid axillary line on rib 5-7 Cardiovascular system: S1 & S2 heard, RRR. No JVD, murmurs, rubs, gallops or clicks. No pedal edema. Gastrointestinal system: Abdomen is nondistended, soft and nontender. No organomegaly or masses felt. Normal bowel sounds heard. Central nervous system: Alert and oriented. No focal neurological deficits. Extremities: Symmetric 5 x 5 power. Skin: No rashes, lesions or ulcers Psychiatry: Judgement and insight appear normal. Mood & affect appropriate.     Data Reviewed: I have personally reviewed following labs and imaging studies  CBC: Recent Labs  Lab 12/17/18 0745 12/18/18 0515  WBC 22.4* 12.7*  NEUTROABS 19.4*  --   HGB 14.3 12.0  HCT 46.1* 37.5  MCV 90.6 87.8  PLT 269 381   Basic Metabolic Panel: Recent Labs  Lab 12/17/18 0745 12/18/18 0515  NA 133* 131*  K 4.1 4.5  CL 98 97*  CO2 20* 26  GLUCOSE 159* 102*  BUN 12 12  CREATININE 1.62* 1.18*  CALCIUM 9.1 8.4*   GFR: Estimated Creatinine Clearance: 39.5 mL/min (A) (by C-G formula based on SCr of 1.18 mg/dL (H)). Liver Function Tests: Recent Labs  Lab 12/17/18 0745  AST 44*  ALT 33  ALKPHOS 71  BILITOT 0.7  PROT 5.3*  ALBUMIN 3.0*   No results for input(s): LIPASE, AMYLASE in the last 168 hours. No results for  input(s): AMMONIA in the last 168 hours. Coagulation Profile: No results for input(s): INR, PROTIME in the last 168 hours. Cardiac Enzymes: No results for input(s): CKTOTAL, CKMB, CKMBINDEX, TROPONINI in the last 168 hours. BNP (last 3 results) No results for input(s): PROBNP in the last 8760 hours. HbA1C: No results for input(s): HGBA1C in the last 72 hours. CBG: No results for input(s): GLUCAP in the last 168 hours. Lipid Profile: No results for input(s): CHOL, HDL, LDLCALC, TRIG, CHOLHDL, LDLDIRECT in the last 72 hours. Thyroid Function Tests: No results for input(s): TSH, T4TOTAL, FREET4, T3FREE, THYROIDAB in the last 72 hours. Anemia Panel: No results for input(s): VITAMINB12, FOLATE, FERRITIN, TIBC, IRON, RETICCTPCT in the last 72 hours. Sepsis Labs: No results for input(s): PROCALCITON, LATICACIDVEN in the last 168 hours.  No results found for this or any previous visit (from the past 240 hour(s)).       Radiology Studies: Dg Shoulder Right  Result Date: 12/17/2018 CLINICAL DATA:  Status post fall.  Right shoulder pain. EXAM: RIGHT SHOULDER - 2+ VIEW COMPARISON:  None. FINDINGS: There is no fracture or dislocation. The glenohumeral joint is normal. There are mild degenerative changes of the acromioclavicular joint. IMPRESSION: No acute osseous injury of the left shoulder. Electronically Signed   By: Kathreen Devoid   On: 12/17/2018 09:59   Ct Head Wo Contrast  Result Date: 12/17/2018 CLINICAL DATA:  Fall EXAM: CT HEAD WITHOUT CONTRAST CT CERVICAL SPINE WITHOUT CONTRAST TECHNIQUE: Multidetector CT imaging of the head and cervical spine was performed following the standard protocol without intravenous contrast. Multiplanar CT image reconstructions of the cervical spine were also generated. COMPARISON:  CT head 10/25/2016 FINDINGS: CT HEAD FINDINGS Brain: Mild atrophy.  Negative for  acute infarct, hemorrhage, mass. Vascular: Negative for hyperdense vessel Skull: Negative for skull  fracture Sinuses/Orbits: Mild mucosal edema paranasal sinuses. Right mastoid effusion. Bilateral cataract surgery Other: None CT CERVICAL SPINE FINDINGS Alignment: 2 mm anterolisthesis C4-5 and C7-T1 Skull base and vertebrae: Negative for fracture Soft tissues and spinal canal: Negative for mass or soft tissue swelling. Carotid artery calcification bilaterally. Disc levels: Multilevel disc and facet degeneration throughout the cervical spine. Disc degeneration and spurring causing foraminal stenosis bilaterally Upper chest: Right apical circumferential pleural thickening unchanged from 04/18/2018. Other: None IMPRESSION: 1. No acute intracranial abnormality 2. Negative for cervical spine fracture. Multilevel degenerative change. Electronically Signed   By: Franchot Gallo M.D.   On: 12/17/2018 07:32   Ct Chest Wo Contrast  Result Date: 12/17/2018 CLINICAL DATA:  Fall with right chest wall pain. History of right middle lobectomy for lung cancer in 2009. EXAM: CT CHEST WITHOUT CONTRAST TECHNIQUE: Multidetector CT imaging of the chest was performed following the standard protocol without IV contrast. COMPARISON:  12/06/2018 chest radiograph. 04/18/2018 chest CT. FINDINGS: Cardiovascular: Normal heart size. No significant pericardial effusion/thickening. Aortic valve prosthesis is in place. Three-vessel coronary atherosclerosis. Atherosclerotic thoracic aorta with stable 4.6 cm ascending thoracic aortic aneurysm. Stable ectatic lower descending thoracic aorta. No evidence of acute intramural hematoma in the thoracic aorta. Top-normal caliber main pulmonary artery (3.1 cm diameter). Mediastinum/Nodes: No discrete thyroid nodules. Unremarkable esophagus. No pathologically enlarged axillary, mediastinal or hilar lymph nodes, noting limited sensitivity for the detection of hilar adenopathy on this noncontrast study. Lungs/Pleura: No pneumothorax. Trace basilar right pleural effusion. Stable posterior right pleural  thickening with scattered pleural calcification. No left pleural effusion. Status post right middle lobectomy. Parenchymal bands throughout the basilar right lower lobe. No acute consolidative airspace disease or lung masses. A few scattered solid pulmonary nodules in both lungs, largest 7 mm in posterior left lower lobe (series 6/image 124), all stable since 03/22/2017 chest CT, considered benign. No new significant pulmonary nodules. Upper abdomen: Scattered pneumobilia in left liver lobe, unchanged. Musculoskeletal: No aggressive appearing focal osseous lesions. Intact sternotomy wires. Acute posterolateral right sixth and seventh rib fractures. IMPRESSION: 1. Acute posterolateral right sixth and seventh rib fractures. No pneumothorax. Trace basilar right pleural effusion. 2. Chronic pleural-parenchymal scarring in the lower right chest. 3. Stable 4.6 cm ascending thoracic aortic aneurysm. Recommend semi-annual imaging followup by CTA or MRA and referral to cardiothoracic surgery if not already obtained. This recommendation follows 2010 ACCF/AHA/AATS/ACR/ASA/SCA/SCAI/SIR/STS/SVM Guidelines for the Diagnosis and Management of Patients With Thoracic Aortic Disease. Circulation. 2010; 121: B353-G992. Aortic aneurysm NOS (ICD10-I71.9) 4. Three-vessel coronary atherosclerosis. Aortic Atherosclerosis (ICD10-I70.0). Electronically Signed   By: Ilona Sorrel M.D.   On: 12/17/2018 07:28   Ct Cervical Spine Wo Contrast  Result Date: 12/17/2018 CLINICAL  DATA:  Fall EXAM: CT HEAD WITHOUT CONTRAST CT CERVICAL SPINE WITHOUT CONTRAST TECHNIQUE: Multidetector CT imaging of the head and cervical spine was performed following the standard protocol without intravenous contrast. Multiplanar CT image reconstructions of the cervical spine were also generated. COMPARISON:  CT head 10/25/2016 FINDINGS: CT HEAD FINDINGS Brain: Mild atrophy.  Negative for  acute infarct, hemorrhage, mass. Vascular: Negative for hyperdense vessel Skull:  Negative for skull fracture Sinuses/Orbits: Mild mucosal edema paranasal sinuses. Right mastoid effusion. Bilateral cataract surgery Other: None CT CERVICAL SPINE FINDINGS Alignment: 2 mm anterolisthesis C4-5 and C7-T1 Skull base and vertebrae: Negative for fracture Soft tissues and spinal canal: Negative for mass or soft tissue swelling. Carotid artery calcification bilaterally. Disc levels: Multilevel disc and facet degeneration throughout the cervical spine. Disc degeneration and spurring causing foraminal stenosis bilaterally Upper chest: Right apical circumferential pleural thickening unchanged from 04/18/2018. Other: None IMPRESSION: 1. No acute intracranial abnormality 2. Negative for cervical spine fracture. Multilevel degenerative change. Electronically Signed   By: Franchot Gallo M.D.   On: 12/17/2018 07:32   Ct Thoracic Spine Wo Contrast  Result Date: 12/17/2018 CLINICAL DATA:  Fall.  Back pain EXAM: CT THORACIC SPINE WITHOUT CONTRAST TECHNIQUE: Multidetector CT images of the thoracic were obtained using the standard protocol without intravenous contrast. COMPARISON:  CT chest 04/18/2018 FINDINGS: Alignment: Mild anterolisthesis T1-2, T2-3, T3-4. Accentuated dorsal kyphosis. Vertebrae: Negative for fracture or Paraspinal and other soft tissues: Advanced atherosclerotic thoracic aorta. Aortic valve replacement. COPD with bronchial thickening. Right middle lobe scarring and postoperative change. 6 mm left lower lobe nodule stable from prior studies and appearing benign. Disc levels: Multilevel disc degeneration throughout the thoracic spine. No significant spinal stenosis. IMPRESSION: Negative for fracture Electronically Signed   By: Franchot Gallo M.D.   On: 12/17/2018 07:23        Scheduled Meds: . amiodarone  200 mg Oral QAC breakfast  . B-complex with vitamin C  1 tablet Oral Q supper  . diltiazem  180 mg Oral Daily  . lidocaine  1 patch Transdermal Q24H  . Rivaroxaban  15 mg Oral QPC  supper   Continuous Infusions:   LOS: 0 days    Time spent: 30 minutes    Loretha Stapler, MD [facility] Pager [phone]  If 7PM-7AM, please contact night-coverage [email or web address] Password [email or web address] 12/18/2018, 9:05 AM

## 2018-12-18 NOTE — Progress Notes (Addendum)
Telemetry order originally discontinued at [name]. Patient was still on monitor at [facility]. RN was called by telemetry representative and notified that the patient was having runs of SVT, PVCs, and that her pacer was spiking. RN notified MD on call. After notification telemetry order has been discontinued per Dr. Lenoria Chime order @ [phone]. Patient currently in sinus rhythm. Central Telemetry updated. Nursing will continue to monitor.

## 2018-12-18 NOTE — Evaluation (Signed)
Physical Therapy Evaluation Patient Details Name: Laurie [name] MRN: [id number] DOB: 1935-08-13 Today's Date: 12/18/2018   History of Present Illness  84 y.o. female with history h/o thoracic aortic aneurysm, paroxysmal atrial fibrillation, COPD, hypertension, hyperlipidemia, bovine aortic valve replacement on Xarelto who presents to the emergency department after a fall.  Clinical Impression  PTA pt living alone independent in mobility, driving and able to perform ADLs, and limited iADLs. Family provides hot meals. Pt currently limited in safe mobility by increased pain with movement, especial R shoulder and trunk due to rib fx. Pt requires modA for bed mobility and minAx2 for transfers and ambulation of 2 feet. PT recommending SNF level rehab prior to returning home for safety. PT will continue to follow acutely.    Follow Up Recommendations SNF    Equipment Recommendations  Other (comment)(TBD at next venue)       Precautions / Restrictions Precautions Precautions: Other (comment) Precaution Comments: Right rib fx Restrictions Weight Bearing Restrictions: No LUE Weight Bearing: Non weight bearing Other Position/Activity Restrictions: Per MD note, right shoulder no fxs      Mobility  Bed Mobility Overal bed mobility: Needs Assistance Bed Mobility: Rolling;Sidelying to Sit Rolling: Min assist Sidelying to sit: Mod assist;HOB elevated       General bed mobility comments: Min A for rolling and then Mod A to elevate trunk into upright posture. Limited by pain  Transfers Overall transfer level: Needs assistance Equipment used: 2 person hand held assist Transfers: Sit to/from Stand Sit to Stand: Min assist;+2 physical assistance;+2 safety/equipment         General transfer comment: Min A +2 for power up into standing from EOB and BSC.   Ambulation/Gait Ambulation/Gait assistance: Min assist;+2 physical assistance Gait Distance (Feet): 2 Feet(2x2 feet) Assistive  device: 2 person hand held assist Gait Pattern/deviations: Step-through pattern;Decreased step length - right;Decreased step length - left;Shuffle;Antalgic;Trunk flexed Gait velocity: slowed Gait velocity interpretation: <1.31 ft/sec, indicative of household ambulator General Gait Details: minAx2 for HHA to ambulate 2 feet from bed to [facility] and 2 feet from [facility] to recliner, vc for upright posture      Balance Overall balance assessment: Needs assistance Sitting-balance support: No upper extremity supported;Feet supported Sitting balance-Leahy Scale: Fair     Standing balance support: Bilateral upper extremity supported;During functional activity Standing balance-Leahy Scale: Poor Standing balance comment: Reliant on UE support                             Pertinent Vitals/Pain Pain Assessment: Faces Faces Pain Scale: Hurts even more Pain Location: Right ribs >  right shoulder Pain Descriptors / Indicators: Constant;Discomfort;Grimacing;Guarding;Sharp Pain Intervention(s): Monitored during session;Limited activity within patient's tolerance;Repositioned    Home Living Family/patient expects to be discharged to:: Private residence Living Arrangements: Alone Available Help at Discharge: Family Type of Home: House Home Access: Stairs to enter Entrance Stairs-Rails: Left Entrance Stairs-Number of Steps: 2 Home Layout: One level Home Equipment: Environmental consultant - 2 wheels;Cane - single point      Prior Function Level of Independence: Independent         Comments: ADLs, IADLs, driving, and has a black lab. Daughter-in-law brings food over. Pt reports she loves to read     Hand Dominance   Dominant Hand: Right    Extremity/Trunk Assessment   Upper Extremity Assessment Upper Extremity Assessment: Defer to OT evaluation RUE Deficits / Details: Shoulder pain with ROM. No acute fractures on imaging.  Able to perform AAROM 0-130* RUE: Unable to fully assess due to pain RUE  Coordination: decreased gross motor    Lower Extremity Assessment Lower Extremity Assessment: Generalized weakness;Overall [facility] for tasks assessed    Cervical / Trunk Assessment Cervical / Trunk Assessment: Other exceptions Cervical / Trunk Exceptions: Right rib fxs  Communication   Communication: No difficulties  Cognition Arousal/Alertness: Awake/alert Behavior During Therapy: WFL for tasks assessed/performed Overall Cognitive Status: Within Functional Limits for tasks assessed                                        General Comments General comments (skin integrity, edema, etc.): SpO2 dropping to 84% on RA during bed mobility. Placed on 2L O2 and she stayed 92-91 during session     Assessment/Plan    PT Assessment Patient needs continued PT services  PT Problem List Decreased strength;Decreased range of motion;Decreased activity tolerance;Decreased balance;Decreased mobility;Pain       PT Treatment Interventions DME instruction;Gait training;Functional mobility training;Therapeutic activities;Therapeutic exercise;Balance training;Cognitive remediation;Patient/family education    PT Goals (Current goals can be found in the Care Plan section)  Acute Rehab PT Goals Patient Stated Goal: "Return home to my dog" PT Goal Formulation: With patient Time For Goal Achievement: 01/01/19 Potential to Achieve Goals: Fair    Frequency Min 3X/week   Barriers to discharge Decreased caregiver support      Co-evaluation PT/OT/SLP Co-Evaluation/Treatment: Yes Reason for Co-Treatment: Complexity of the patient's impairments (multi-system involvement);For patient/therapist safety PT goals addressed during session: Mobility/safety with mobility;Balance;Strengthening/ROM         AM-PAC PT "6 Clicks" Mobility  Outcome Measure Help needed turning from your back to your side while in a flat bed without using bedrails?: A Little Help needed moving from lying on your back  to sitting on the side of a flat bed without using bedrails?: A Lot Help needed moving to and from a bed to a chair (including a wheelchair)?: A Little Help needed standing up from a chair using your arms (e.g., wheelchair or bedside chair)?: A Little Help needed to walk in hospital room?: A Lot Help needed climbing 3-5 steps with a railing? : Total 6 Click Score: 14    End of Session Equipment Utilized During Treatment: Oxygen Activity Tolerance: Patient limited by pain Patient left: in chair;with call bell/phone within reach;with chair alarm set Nurse Communication: Mobility status;Other (comment)(placed pt on 2L O2 via [place]) PT Visit Diagnosis: Unsteadiness on feet (R26.81);Other abnormalities of gait and mobility (R26.89);History of falling (Z91.81);Difficulty in walking, not elsewhere classified (R26.2);Pain Pain - Right/Left: Right Pain - part of body: Shoulder(ribs)    Time: 7680-8811 PT Time Calculation (min) (ACUTE ONLY): 33 min   Charges:   PT Evaluation $PT Eval Moderate Complexity: 1 Mod          Jerardo Costabile B. Migdalia Dk PT, DPT Acute Rehabilitation Services Pager [phone] Office [phone]   [place] 12/18/2018, 1:22 PM

## 2018-12-19 ENCOUNTER — Observation Stay (HOSPITAL_COMMUNITY): Payer: Medicare HMO

## 2018-12-19 DIAGNOSIS — Z950 Presence of cardiac pacemaker: Secondary | ICD-10-CM | POA: Diagnosis not present

## 2018-12-19 DIAGNOSIS — J9601 Acute respiratory failure with hypoxia: Secondary | ICD-10-CM | POA: Diagnosis present

## 2018-12-19 DIAGNOSIS — I2510 Atherosclerotic heart disease of native coronary artery without angina pectoris: Secondary | ICD-10-CM | POA: Diagnosis present

## 2018-12-19 DIAGNOSIS — Z8673 Personal history of transient ischemic attack (TIA), and cerebral infarction without residual deficits: Secondary | ICD-10-CM | POA: Diagnosis not present

## 2018-12-19 DIAGNOSIS — M25511 Pain in right shoulder: Secondary | ICD-10-CM | POA: Diagnosis present

## 2018-12-19 DIAGNOSIS — Z85118 Personal history of other malignant neoplasm of bronchus and lung: Secondary | ICD-10-CM | POA: Diagnosis not present

## 2018-12-19 DIAGNOSIS — Z9841 Cataract extraction status, right eye: Secondary | ICD-10-CM | POA: Diagnosis not present

## 2018-12-19 DIAGNOSIS — Z9049 Acquired absence of other specified parts of digestive tract: Secondary | ICD-10-CM | POA: Diagnosis not present

## 2018-12-19 DIAGNOSIS — R0902 Hypoxemia: Secondary | ICD-10-CM | POA: Diagnosis present

## 2018-12-19 DIAGNOSIS — I480 Paroxysmal atrial fibrillation: Secondary | ICD-10-CM | POA: Diagnosis present

## 2018-12-19 DIAGNOSIS — S2241XA Multiple fractures of ribs, right side, initial encounter for closed fracture: Secondary | ICD-10-CM | POA: Diagnosis present

## 2018-12-19 DIAGNOSIS — E785 Hyperlipidemia, unspecified: Secondary | ICD-10-CM | POA: Diagnosis present

## 2018-12-19 DIAGNOSIS — J449 Chronic obstructive pulmonary disease, unspecified: Secondary | ICD-10-CM | POA: Diagnosis present

## 2018-12-19 DIAGNOSIS — Z902 Acquired absence of lung [part of]: Secondary | ICD-10-CM | POA: Diagnosis not present

## 2018-12-19 DIAGNOSIS — I712 Thoracic aortic aneurysm, without rupture: Secondary | ICD-10-CM | POA: Diagnosis present

## 2018-12-19 DIAGNOSIS — Y92003 Bedroom of unspecified non-institutional (private) residence as the place of occurrence of the external cause: Secondary | ICD-10-CM | POA: Diagnosis not present

## 2018-12-19 DIAGNOSIS — I5032 Chronic diastolic (congestive) heart failure: Secondary | ICD-10-CM | POA: Diagnosis present

## 2018-12-19 DIAGNOSIS — Z9089 Acquired absence of other organs: Secondary | ICD-10-CM | POA: Diagnosis not present

## 2018-12-19 DIAGNOSIS — Z952 Presence of prosthetic heart valve: Secondary | ICD-10-CM | POA: Diagnosis not present

## 2018-12-19 DIAGNOSIS — Z961 Presence of intraocular lens: Secondary | ICD-10-CM | POA: Diagnosis present

## 2018-12-19 DIAGNOSIS — Z9842 Cataract extraction status, left eye: Secondary | ICD-10-CM | POA: Diagnosis not present

## 2018-12-19 DIAGNOSIS — W19XXXA Unspecified fall, initial encounter: Secondary | ICD-10-CM | POA: Diagnosis not present

## 2018-12-19 DIAGNOSIS — N179 Acute kidney failure, unspecified: Secondary | ICD-10-CM | POA: Diagnosis present

## 2018-12-19 DIAGNOSIS — I959 Hypotension, unspecified: Secondary | ICD-10-CM | POA: Diagnosis present

## 2018-12-19 DIAGNOSIS — W010XXA Fall on same level from slipping, tripping and stumbling without subsequent striking against object, initial encounter: Secondary | ICD-10-CM | POA: Diagnosis present

## 2018-12-19 DIAGNOSIS — D509 Iron deficiency anemia, unspecified: Secondary | ICD-10-CM | POA: Diagnosis present

## 2018-12-19 DIAGNOSIS — S2239XA Fracture of one rib, unspecified side, initial encounter for closed fracture: Secondary | ICD-10-CM | POA: Diagnosis present

## 2018-12-19 DIAGNOSIS — Z87891 Personal history of nicotine dependence: Secondary | ICD-10-CM | POA: Diagnosis not present

## 2018-12-19 DIAGNOSIS — I110 Hypertensive heart disease with heart failure: Secondary | ICD-10-CM | POA: Diagnosis present

## 2018-12-19 MED ORDER — POLYETHYLENE GLYCOL 3350 17 G PO PACK
17.0000 g | PACK | Freq: Every day | ORAL | Status: DC
  Administered 2018-12-19 – 2018-12-20 (×2): 17 g via ORAL
  Filled 2018-12-19 (×2): qty 1

## 2018-12-19 MED ORDER — SENNOSIDES-DOCUSATE SODIUM 8.6-50 MG PO TABS
1.0000 | ORAL_TABLET | Freq: Two times a day (BID) | ORAL | Status: DC
  Administered 2018-12-19 – 2018-12-20 (×3): 1 via ORAL
  Filled 2018-12-19 (×3): qty 1

## 2018-12-19 MED ORDER — POLYETHYLENE GLYCOL 3350 17 GM/SCOOP PO POWD
17.0000 g | Freq: Every day | ORAL | Status: DC
  Filled 2018-12-19 (×2): qty 255

## 2018-12-19 MED ORDER — OXYCODONE HCL 5 MG PO TABS
5.0000 mg | ORAL_TABLET | ORAL | Status: DC | PRN
  Administered 2018-12-19 – 2018-12-20 (×3): 5 mg via ORAL
  Filled 2018-12-19 (×3): qty 1

## 2018-12-19 NOTE — Clinical Social Work Note (Signed)
Clinical Social Work Assessment  Patient Details  Name: Laurie [name] MRN: [id number] Date of Birth: 1935/10/20  Date of referral:  12/19/18               Reason for consult:  Discharge Planning                Permission sought to share information with:  Case Manager Permission granted to share information::  Yes, Verbal Permission Granted  Name::     Laurie [name]  Agency::     Relationship::     Contact Information:     Housing/Transportation Living arrangements for the past 2 months:  Single Family Home Source of Information:  Patient Patient Interpreter Needed:  None Criminal Activity/Legal Involvement Pertinent to Current Situation/Hospitalization:  No - Comment as needed Significant Relationships:  Adult Children Lives with:  Self Do you feel safe going back to the place where you live?  Yes Need for family participation in patient care:  No (Coment)  Care giving concerns:    CSW met with the patient at bedside. Patient was alert and oriented. Patient had questions about possibly going to inpatient rehab.  CSW explained that when the physical therapist completes their evaluation, they recommend the patient for the certain criteria that they meet. CSW explained that if the patient was not recommended for CIR then she is not an eligible candidate. CSW explained that the patient is being recommended for a skilled nursing facility. Patient is familiar with a skilled nursing facility. She did have some questions about inpatient rehab. CSW explained the process of skilled nursing and the process. Patient had concerns about her health. She stated that she had open heart surgery and now has a Psychologist, forensic. She stated that some days that she does not feel well. Patient wanted to consult her son about going to a skilled nursing facility. Patient wanted CSW to call her son and explain what the CSW discussed with the patient.   Patient does not have a preference on a facility. Patient asked for a  skilled nursing facility list. CSW spoke with the patient's son.    Will need to start insurance authorization. MD has cleared the patient.    Social Worker assessment / plan:    CSW Laurie [name] has completed the FL2 note and CSW Laurie [name] completed the patient assessment. CSW spoke with the patients son and provided him with information regarding skilled nursing facility placement for his mother. He will be coming up to the hospital later to visit with his mother and they will be deciding a facility for his mother.    Employment status:  Retired Nurse, adult PT Recommendations:  [place] / Referral to community resources:  [place]  Patient/Family's Response to care:  Patient is understanding of current medical condition. Is accepting of her condition. Is willing to go to a skilled nursing facility to receive additional resources.   Patient/Family's Understanding of and Emotional Response to Diagnosis, Current Treatment, and Prognosis:  Family is understanding and supportive of the patient.   Emotional Assessment Appearance:  Appears stated age Attitude/Demeanor/Rapport:  Unable to Assess Affect (typically observed):  Unable to Assess Orientation:  Oriented to Self, Oriented to Place, Oriented to  Time Alcohol / Substance use:  Not Applicable Psych involvement (Current and /or in the community):  No (Comment)  Discharge Needs  Concerns to be addressed:  Discharge Planning Concerns Readmission within the last 30 days:  No Current discharge  risk:  None Barriers to Discharge:  [place] 12/19/2018, 1:04 PM

## 2018-12-19 NOTE — Plan of Care (Signed)
Problem: Education: Goal: Knowledge of General Education information will improve Description Including pain rating scale, medication(s)/side effects and non-pharmacologic comfort measures Outcome: Progressing   Problem: Health Behavior/Discharge Planning: Goal: Ability to manage health-related needs will improve Outcome: Progressing   Problem: Clinical Measurements: Goal: Respiratory complications will improve Outcome: Progressing   Problem: Nutrition: Goal: Adequate nutrition will be maintained Outcome: Progressing   Problem: Coping: Goal: Level of anxiety will decrease Outcome: Progressing   Problem: Pain Managment: Goal: General experience of comfort will improve Outcome: Progressing   Problem: Skin Integrity: Goal: Risk for impaired skin integrity will decrease Outcome: Progressing

## 2018-12-19 NOTE — Discharge Instructions (Signed)

## 2018-12-19 NOTE — NC FL2 (Signed)
[place] LEVEL OF CARE SCREENING TOOL     IDENTIFICATION  Patient Name: Laurie [name] Birthdate: 09-05-1935 Sex: female Admission Date (Current Location): 12/17/2018  [facility] and [place] Number:  [employer] and Address:  [facility] [place], [place] [address], [place], [place] [address]      Provider Number: [id number]  Attending Physician Name and Address:  Dessa Phi, DO  Relative Name and Phone Number:  [name] (son) [phone]    Current Level of Care: Hospital Recommended Level of Care: [place] Prior Approval Number:    Date Approved/Denied:   PASRR Number: [id number]  Discharge Plan: SNF    Current Diagnoses: Patient Active Problem List   Diagnosis Date Noted  . Fall 12/17/2018  . Tachy-brady syndrome ([place]) 12/05/2018  . Low grade squamous intraepithelial lesion (LGSIL) on cervicovaginal cytologic smear 04/08/2018  . Paroxysmal atrial fibrillation ([place]) 02/25/2018  . Sinus bradycardia 02/25/2018  . Incidental pulmonary nodule, > 82mm and < 18mm   . Claudication ([place]) 04/22/2016  . Dyspnea   . Atrial fibrillation with RVR ([place])   . COPD (chronic obstructive pulmonary disease) ([place])   . RUQ abdominal pain   . Biliary calculus   . Pulmonary emphysema ([place])   . Aortic valve disease   . Thoracic aortic aneurysm without rupture ([place]) 08/19/2015  . Ascending aorta dilatation (H[facility])   . Abdominal aortic aneurysm ([place]) 08/26/2011  . HYPERGLYCEMIA, MILD 11/10/2010  . OSTEOPENIA 09/29/2010  . Coronary atherosclerosis 05/15/2009  . Bilateral carotid artery disease ([place]) 05/15/2009  . TIA 05/15/2009  . ILIAC ARTERY ANEURYSM 05/15/2009  . GAD (generalized anxiety disorder) 03/20/2009  . THYROID NODULE 01/09/2009  . THYROMEGALY 01/08/2009  . SIALOLITHIASIS 01/08/2009  . MALIGNANT NEOPLASM MIDDLE LOBE BRONCHUS OR LUNG 10/24/2008  . HLD (hyperlipidemia) 10/24/2008  . AORTIC VALVE REPLACEMENT, HX OF  10/24/2008  . S/P aortic valve replacement 08/28/2008  . DIVERTICULOSIS OF COLON 06/19/2008  . ANEMIA, IRON DEFICIENCY 06/06/2008  . Essential hypertension, benign 06/06/2008  . GASTROINTESTINAL HEMORRHAGE, HX OF 06/06/2008    Orientation RESPIRATION BLADDER Height & Weight     Self, Time, Situation, Place  O2(nasal cannula 3L/min) Continent Weight: 78.5 kg Height:  5\' 6"  (167.6 cm)  BEHAVIORAL SYMPTOMS/MOOD NEUROLOGICAL BOWEL NUTRITION STATUS      Continent Diet(see discharge summary)  AMBULATORY STATUS COMMUNICATION OF NEEDS Skin   Extensive Assist Verbally                         Personal Care Assistance Level of Assistance  Bathing, Feeding, Dressing, Total care Bathing Assistance: Maximum assistance Feeding assistance: Independent Dressing Assistance: Maximum assistance Total Care Assistance: Maximum assistance   Functional Limitations Info  Sight, Hearing, Speech Sight Info: Impaired Hearing Info: Impaired Speech Info: Adequate    SPECIAL CARE FACTORS FREQUENCY  PT (By licensed PT), OT (By licensed OT)     PT Frequency: min 5x weekly OT Frequency: min 5x weekly            Contractures Contractures Info: Not present    Additional Factors Info  Allergies, Code Status Code Status Info: full Allergies Info: Hyoscamine, Rofecoxib, Statins, Metoprolol           Current Medications (12/19/2018):  This is the current hospital active medication list Current Facility-Administered Medications  Medication Dose Route Frequency Provider Last Rate Last Dose  . acetaminophen (TYLENOL) tablet 650 mg  650 mg Oral Q6H PRN [place] Shi,  MD       Or  . acetaminophen (TYLENOL) suppository 650 mg  650 mg Rectal Q6H PRN Gu[place] Shi, MD      . acetaminophen (TYLENOL) tablet 650 mg  650 mg Oral Q4H PRN Eber Jones, MD   650 mg at 12/18/18 1545  . albuterol (PROVENTIL) (2.5 MG/3ML) 0.083% nebulizer solution 2.5 mg  2.5 mg Nebulization Q4H PRN Guilford Shi, MD      . ALPRAZolam Duanne Moron) tablet 0.25 mg  0.25 mg Oral BID PRN Guilford Shi, MD      . amiodarone (PACERONE) tablet 200 mg  200 mg Oral QAC breakfast Guilford Shi, MD   200 mg at 12/19/18 0804  . B-complex with vitamin C tablet 1 tablet  1 tablet Oral Q supper Guilford Shi, MD   1 tablet at 12/18/18 1716  . diltiazem (CARDIZEM CD) 24 hr capsule 180 mg  180 mg Oral Daily Guilford Shi, MD   180 mg at 12/19/18 1030  . lidocaine (LIDODERM) 5 % 1 patch  1 patch Transdermal Q24H Guilford Shi, MD   1 patch at 12/18/18 1429  . ondansetron (ZOFRAN) tablet 4 mg  4 mg Oral Q6H PRN Guilford Shi, MD       Or  . ondansetron (ZOFRAN) injection 4 mg  4 mg Intravenous Q6H PRN Guilford Shi, MD      . oxyCODONE (Oxy IR/ROXICODONE) immediate release tablet 5 mg  5 mg Oral Q3H PRN Dessa Phi, DO   5 mg at 12/19/18 1030  . polyethylene glycol powder (GLYCOLAX/MIRALAX) container 17 g  17 g Oral Daily Dessa Phi, DO      . Rivaroxaban (XARELTO) tablet 15 mg  15 mg Oral QPC supper Guilford Shi, MD   15 mg at 12/18/18 1716  . senna-docusate (Senokot-S) tablet 1 tablet  1 tablet Oral BID Dessa Phi, DO   1 tablet at 12/19/18 1030  . traMADol (ULTRAM) tablet 50 mg  50 mg Oral Q6H PRN Gu[place] Shi, MD   50 mg at 12/19/18 2081     Discharge Medications: Please see discharge summary for a list of discharge medications.  Relevant Imaging Results:  Relevant Lab Results:   Additional Information SSN: [id number]  Alberteen Sam, LCSW

## 2018-12-19 NOTE — Progress Notes (Addendum)
PROGRESS NOTE    Laurie [name]  JXB:[id number] DOB: May 03, 1935 DOA: 12/17/2018 PCP: Laurie Marry, MD     Brief Narrative:  Laurie [name] a 84 y.o.femalewith history h/othoracicaortic aneurysm, paroxysmal atrial fibrillation, COPD, hypertension, hyperlipidemia,bovineaortic valve replacement on Xarelto who presents to the emergency department aftera fall.Patient lives alone and at baseline ambulates independently without the need for cane or walker. She recalls going to bed feeling fine at 11 PM last night. Little after midnight she felt the urge to use the bathroom and got out of her bed when her leg got trapped in the sheets/bedand she fell onto her right side. She denies hitting her head but is complaining of right shoulder pain 6/10 as well as right lateral chest wall pain at 10/10. Trauma work-up in the ED including CT cervical spine, thoracic spine, CT head negative for any acute injuries/findings. CT chest however shows acute posterolateral sixth and seventh rib fractures with no pneumothorax. Traceright pleural effusion and stable 4.6 cm thoracic aortic aneurysm reported. On arrival, patient's blood pressure was apparently low in the 80s and received IV fluid bolus. Patient also received IV fentanyl 50 mcg and her blood pressure appears to be in the low 100s now and is rating her pain level at 8/10 along the ribs. Additionally, patient was noted to be hypoxic requiring 2 L nasal cannula. Lab work shows leukocytosis of 22,000, AKI with creatinine up to 1.6.  New events last 24 hours / Subjective: Patient states that she is not feeling well.  States that she has been urinating all night, continues to have severe pain on the right side where the rib fractures are, still continuing to require nasal cannula O2.  Her mobility is not at baseline, PT OT recommending skilled nursing facility placement.  Assessment & Plan:   Active Problems:   Fall  Right  sixth and seventh rib fractures after mechanical fall at home Continues to have pain Encouraged incentive spirometer Chest x-ray obtained and reviewed independently, she does have bibasilar atelectasis PT OT recommending skilled nursing facility placement  Acute hypoxemic respiratory failure Currently on nasal cannula O2, wean as able  AKI Improved Cr, stop IVF   Chronic atrial fibrillation Continue amiodarone, diltiazem, Xarelto  History of squamous cell lung cancer Status post surgery/lobectomy right middle lobe     DVT prophylaxis: Xarelto Code Status: Full code Family Communication: No family at bedside Disposition Plan: Pending improvement in pain, respiratory status.  Skilled nursing facility recommended  PT/OT evaluation performed.  SNF recommended.  SNF is appropriate as the patient has received 2 days of hospital care and is felt to need rehab services to restore this patient to the prior level of function to achieve safe transition back to home care given her acute rib fractures after fall.  This patient needs rehab services for at least 5 days/week and skilled nursing services daily to facilitate this transition.  Rehab is being requested as the most appropriate discharge option for this patient and is not felt to be for custodial care as evidenced by her baseline independence prior to admission.    Consultants:   None  Procedures:   None   Antimicrobials:  Anti-infectives (From admission, onward)   None       Objective: Vitals:   12/18/18 2013 12/18/18 2133 12/19/18 0430 12/19/18 1030  BP: 131/61 (!) 145/71 132/86 135/61  Pulse: 60 63 72 63  Resp:  16 20   Temp: 98.5 F (36.9 C) 98.5 F (36.9  C) 98.2 F (36.8 C)   TempSrc: Oral Oral Oral   SpO2: 91% 100% 92%   Weight:      Height:        Intake/Output Summary (Last 24 hours) at 12/19/2018 1252 Last data filed at 12/19/2018 0900 Gross per 24 hour  Intake 720 ml  Output 951 ml  Net -231 ml    Filed Weights   12/18/18 1444  Weight: 78.5 kg    Examination:  General exam: Appears calm  Respiratory system: Inspiratory effort limited by rib pain Cardiovascular system: S1 & S2 heard. No JVD, murmurs, rubs, gallops or clicks. No pedal edema. Gastrointestinal system: Abdomen is nondistended, soft and nontender. No organomegaly or masses felt. Normal bowel sounds heard. Central nervous system: Alert and oriented. No focal neurological deficits. Extremities: Symmetric 5 x 5 power. Skin: No rashes, lesions or ulcers Psychiatry: Judgement and insight appear normal. Mood & affect appropriate.   Data Reviewed: I have personally reviewed following labs and imaging studies  CBC: Recent Labs  Lab 12/17/18 0745 12/18/18 0515 12/18/18 1218  WBC 22.4* 12.7*  --   NEUTROABS 19.4*  --   --   HGB 14.3 12.0 12.5  HCT 46.1* 37.5 39.1  MCV 90.6 87.8  --   PLT 269 194  --    Basic Metabolic Panel: Recent Labs  Lab 12/17/18 0745 12/18/18 0515  NA 133* 131*  K 4.1 4.5  CL 98 97*  CO2 20* 26  GLUCOSE 159* 102*  BUN 12 12  CREATININE 1.62* 1.18*  CALCIUM 9.1 8.4*   GFR: Estimated Creatinine Clearance: 39.5 mL/min (A) (by C-G formula based on SCr of 1.18 mg/dL (H)). Liver Function Tests: Recent Labs  Lab 12/17/18 0745  AST 44*  ALT 33  ALKPHOS 71  BILITOT 0.7  PROT 5.3*  ALBUMIN 3.0*   No results for input(s): LIPASE, AMYLASE in the last 168 hours. No results for input(s): AMMONIA in the last 168 hours. Coagulation Profile: No results for input(s): INR, PROTIME in the last 168 hours. Cardiac Enzymes: No results for input(s): CKTOTAL, CKMB, CKMBINDEX, TROPONINI in the last 168 hours. BNP (last 3 results) No results for input(s): PROBNP in the last 8760 hours. HbA1C: No results for input(s): HGBA1C in the last 72 hours. CBG: No results for input(s): GLUCAP in the last 168 hours. Lipid Profile: No results for input(s): CHOL, HDL, LDLCALC, TRIG, CHOLHDL, LDLDIRECT in  the last 72 hours. Thyroid Function Tests: No results for input(s): TSH, T4TOTAL, FREET4, T3FREE, THYROIDAB in the last 72 hours. Anemia Panel: No results for input(s): VITAMINB12, FOLATE, FERRITIN, TIBC, IRON, RETICCTPCT in the last 72 hours. Sepsis Labs: No results for input(s): PROCALCITON, LATICACIDVEN in the last 168 hours.  No results found for this or any previous visit (from the past 240 hour(s)).     Radiology Studies: Dg Chest 2 View  Result Date: 12/19/2018 CLINICAL DATA:  Right-sided rib fracture.  Pleural effusion. EXAM: CHEST - 2 VIEW COMPARISON:  12/06/2018; chest CT-12/17/2018 FINDINGS: Grossly unchanged cardiac silhouette and mediastinal contours post median sternotomy and valve replacement. Stable position of support apparatus. Acute, minimally displaced fractures involving the posterolateral aspects of the right sixth and seventh ribs with suspected slight increase in trace right-sided pleural effusion. Suspected development of a trace left-sided pleural effusion. Mild pulmonary venous congestion without frank evidence of edema. Slight worsening bibasilar opacities favored to represent atelectasis. Grossly unchanged biapical pleuroparenchymal thickening, right greater than left. No pneumothorax. Regional soft tissues appear normal.  No radiopaque foreign body. IMPRESSION: 1. Acute minimally displaced fractures involving the posterolateral aspects of the right sixth and seventh ribs with slight increase in persistent trace right-sided pleural effusion. No pneumothorax. 2. Interval development of a trace left-sided pleural effusion and worsening bibasilar opacities, likely atelectasis. Electronically Signed   By: Sandi Mariscal M.D.   On: 12/19/2018 11:20      Scheduled Meds: . amiodarone  200 mg Oral QAC breakfast  . B-complex with vitamin C  1 tablet Oral Q supper  . diltiazem  180 mg Oral Daily  . lidocaine  1 patch Transdermal Q24H  . polyethylene glycol  17 g Oral Daily    . Rivaroxaban  15 mg Oral QPC supper  . senna-docusate  1 tablet Oral BID   Continuous Infusions:   LOS: 0 days    Time spent: 35 minutes   Dessa Phi, DO [facility] [email or web address] 12/19/2018, 12:52 PM

## 2018-12-19 NOTE — Progress Notes (Signed)
[place] started insurance auth today through [employer].   [place], [place]

## 2018-12-20 DIAGNOSIS — N390 Urinary tract infection, site not specified: Secondary | ICD-10-CM | POA: Diagnosis not present

## 2018-12-20 DIAGNOSIS — J984 Other disorders of lung: Secondary | ICD-10-CM | POA: Diagnosis not present

## 2018-12-20 DIAGNOSIS — R279 Unspecified lack of coordination: Secondary | ICD-10-CM | POA: Diagnosis not present

## 2018-12-20 DIAGNOSIS — I5032 Chronic diastolic (congestive) heart failure: Secondary | ICD-10-CM | POA: Diagnosis not present

## 2018-12-20 DIAGNOSIS — Z743 Need for continuous supervision: Secondary | ICD-10-CM | POA: Diagnosis not present

## 2018-12-20 DIAGNOSIS — S2241XB Multiple fractures of ribs, right side, initial encounter for open fracture: Secondary | ICD-10-CM | POA: Diagnosis not present

## 2018-12-20 DIAGNOSIS — S2241XA Multiple fractures of ribs, right side, initial encounter for closed fracture: Secondary | ICD-10-CM | POA: Diagnosis not present

## 2018-12-20 DIAGNOSIS — I712 Thoracic aortic aneurysm, without rupture: Secondary | ICD-10-CM | POA: Diagnosis not present

## 2018-12-20 DIAGNOSIS — F3489 Other specified persistent mood disorders: Secondary | ICD-10-CM | POA: Diagnosis not present

## 2018-12-20 DIAGNOSIS — R2681 Unsteadiness on feet: Secondary | ICD-10-CM | POA: Diagnosis not present

## 2018-12-20 DIAGNOSIS — M6281 Muscle weakness (generalized): Secondary | ICD-10-CM | POA: Diagnosis not present

## 2018-12-20 DIAGNOSIS — E871 Hypo-osmolality and hyponatremia: Secondary | ICD-10-CM | POA: Diagnosis not present

## 2018-12-20 DIAGNOSIS — J9601 Acute respiratory failure with hypoxia: Secondary | ICD-10-CM | POA: Diagnosis not present

## 2018-12-20 DIAGNOSIS — S2241XD Multiple fractures of ribs, right side, subsequent encounter for fracture with routine healing: Secondary | ICD-10-CM | POA: Diagnosis not present

## 2018-12-20 DIAGNOSIS — I480 Paroxysmal atrial fibrillation: Secondary | ICD-10-CM | POA: Diagnosis not present

## 2018-12-20 DIAGNOSIS — R41841 Cognitive communication deficit: Secondary | ICD-10-CM | POA: Diagnosis not present

## 2018-12-20 DIAGNOSIS — R0989 Other specified symptoms and signs involving the circulatory and respiratory systems: Secondary | ICD-10-CM | POA: Diagnosis not present

## 2018-12-20 DIAGNOSIS — R278 Other lack of coordination: Secondary | ICD-10-CM | POA: Diagnosis not present

## 2018-12-20 DIAGNOSIS — W19XXXA Unspecified fall, initial encounter: Secondary | ICD-10-CM | POA: Diagnosis not present

## 2018-12-20 LAB — BASIC METABOLIC PANEL
Anion gap: 12 (ref 5–15)
BUN: 6 mg/dL — ABNORMAL LOW (ref 8–23)
CALCIUM: 8.8 mg/dL — AB (ref 8.9–10.3)
CO2: 24 mmol/L (ref 22–32)
Chloride: 93 mmol/L — ABNORMAL LOW (ref 98–111)
Creatinine, Ser: 0.79 mg/dL (ref 0.44–1.00)
GFR calc Af Amer: >60 mL/min (ref >60)
GFR calc non Af Amer: >60 mL/min (ref >60)
Glucose, Bld: 91 mg/dL (ref 70–99)
Potassium: 4 mmol/L (ref 3.5–5.1)
Sodium: 129 mmol/L — ABNORMAL LOW (ref 135–145)

## 2018-12-20 LAB — CBC
HCT: 37.6 % (ref 36.0–46.0)
Hemoglobin: 12.4 g/dL (ref 12.0–15.0)
MCH: 28.4 pg (ref 26.0–34.0)
MCHC: 33 g/dL (ref 30.0–36.0)
MCV: 86.2 fL (ref 80.0–100.0)
Platelets: 181 10*3/uL (ref 150–400)
RBC: 4.36 MIL/uL (ref 3.87–5.11)
RDW: 12.9 % (ref 11.5–15.5)
WBC: 10.1 10*3/uL (ref 4.0–10.5)
nRBC: 0 % (ref 0.0–0.2)

## 2018-12-20 MED ORDER — RIVAROXABAN 20 MG PO TABS
20.0000 mg | ORAL_TABLET | Freq: Every day | ORAL | Status: DC
  Administered 2018-12-20: 20 mg via ORAL
  Filled 2018-12-20: qty 1

## 2018-12-20 MED ORDER — OXYCODONE HCL 5 MG PO TABS: 5.0000 mg | ORAL_TABLET | ORAL | 0 refills | Status: AC | PRN

## 2018-12-20 MED ORDER — LIDOCAINE 5 % EX PTCH: 1.0000 | MEDICATED_PATCH | CUTANEOUS | 0 refills | Status: DC

## 2018-12-20 MED ORDER — ALPRAZOLAM 0.25 MG PO TABS: ORAL_TABLET | ORAL | 0 refills | Status: DC

## 2018-12-20 MED ORDER — AMLODIPINE BESYLATE 5 MG PO TABS: 5.0000 mg | ORAL_TABLET | Freq: Every day | ORAL | Status: DC

## 2018-12-20 MED ORDER — METOPROLOL TARTRATE 50 MG PO TABS: 50.0000 mg | ORAL_TABLET | Freq: Every day | ORAL | Status: DC

## 2018-12-20 NOTE — Progress Notes (Signed)
Report called to Quillian Quince at [facility]. Pt made aware of imminent discharge.

## 2018-12-20 NOTE — Progress Notes (Signed)
Physical Therapy Treatment Patient Details Name: Laurie [name] MRN: [id number] DOB: October 12, 1935 Today's Date: 12/20/2018    History of Present Illness 84 y.o. female with history h/o thoracic aortic aneurysm, paroxysmal atrial fibrillation, COPD, hypertension, hyperlipidemia, bovine aortic valve replacement on Xarelto who presents to the emergency department after a fall. CT chest shows acute posterolateral sixth and seventh rib fxs with no pneumothorax. CT of right shoulder shows no acute fx.     PT Comments    Pt is willing to work with therapy and is making good progress towards her goals, however continues to be limited in safe mobility by increase R shoulder pain with both AROM and PROM, as well as R sided pain with movement due to R rib fx. Pt is min Ax2 for transfers and min Ax2 progressing to minAx1 for HHA with ambulation of 150 feet. D/c plan continues to remain appropriate for progressing pain reduced mobility. PT will continue to follow acutely.   Follow Up Recommendations  SNF     Equipment Recommendations  Other (comment)(TBD at next venue)    Recommendations for Other Services       Precautions / Restrictions Precautions Precautions: Other (comment) Precaution Comments: Right rib fx Restrictions Weight Bearing Restrictions: No Other Position/Activity Restrictions: Per MD note, right shoulder no fxs    Mobility  Bed Mobility               General bed mobility comments: Sitting at EOB performing bath upon arrival  Transfers Overall transfer level: Needs assistance Equipment used: 2 person hand held assist Transfers: Sit to/from Stand Sit to Stand: Min assist;+2 physical assistance;+2 safety/equipment         General transfer comment: Min A for power up into standing. Pt holding pillow for rib pain and right shoulder pain. +2 for safety  Ambulation/Gait Ambulation/Gait assistance: Min assist;+2 physical assistance Gait Distance (Feet): 150  Feet Assistive device: 2 person hand held assist;1 person hand held assist Gait Pattern/deviations: Step-through pattern;Decreased step length - right;Decreased step length - left;Shuffle;Antalgic;Trunk flexed Gait velocity: slowed Gait velocity interpretation: <1.8 ft/sec, indicate of risk for recurrent falls General Gait Details: minAx2 HHA progressing to minAx1 HHA for ambulation in hallway, vc for increased knee flexion to clear feet, able to decrease shuffling as gait progressed         Balance Overall balance assessment: Needs assistance Sitting-balance support: No upper extremity supported;Feet supported Sitting balance-Leahy Scale: Fair     Standing balance support: Bilateral upper extremity supported;During functional activity Standing balance-Leahy Scale: Poor Standing balance comment: reliant on physical A                            Cognition Arousal/Alertness: Awake/alert Behavior During Therapy: WFL for tasks assessed/performed Overall Cognitive Status: Within Functional Limits for tasks assessed                                        Exercises General Exercises - Upper Extremity Wrist Flexion: AROM;Right;10 reps;Seated Wrist Extension: AROM;Right;10 reps;Seated    General Comments General comments (skin integrity, edema, etc.): SpO2 97% on RA, Pt with increased R shoulder AROM/PROM pain throughout session. No fxs indicated on imaging, but pt may need ortho consult for possible soft tissue damage/ rotator cuff injury      Pertinent Vitals/Pain Pain Assessment: 0-10 Pain Score: 10-Worst pain ever Pain  Location: Right shoulder Pain Descriptors / Indicators: Constant;Discomfort;Grimacing;Guarding;Sharp Pain Intervention(s): Monitored during session;Limited activity within patient's tolerance;Repositioned           PT Goals (current goals can now be found in the care plan section) Acute Rehab PT Goals Patient Stated Goal: "Return  home to my dog" PT Goal Formulation: With patient Time For Goal Achievement: 01/01/19 Potential to Achieve Goals: Fair Progress towards PT goals: Progressing toward goals    Frequency    Min 3X/week      PT Plan Current plan remains appropriate    Co-evaluation PT/OT/SLP Co-Evaluation/Treatment: Yes Reason for Co-Treatment: Complexity of the patient's impairments (multi-system involvement);For patient/therapist safety PT goals addressed during session: Mobility/safety with mobility;Balance OT goals addressed during session: ADL's and self-care      AM-PAC PT "6 Clicks" Mobility   Outcome Measure  Help needed turning from your back to your side while in a flat bed without using bedrails?: A Little Help needed moving from lying on your back to sitting on the side of a flat bed without using bedrails?: A Lot Help needed moving to and from a bed to a chair (including a wheelchair)?: A Little Help needed standing up from a chair using your arms (e.g., wheelchair or bedside chair)?: A Little Help needed to walk in hospital room?: A Little Help needed climbing 3-5 steps with a railing? : A Lot 6 Click Score: 16    End of Session   Activity Tolerance: Patient limited by pain Patient left: in chair;with call bell/phone within reach;with nursing/sitter in room Nurse Communication: Mobility status PT Visit Diagnosis: Unsteadiness on feet (R26.81);Other abnormalities of gait and mobility (R26.89);History of falling (Z91.81);Difficulty in walking, not elsewhere classified (R26.2);Pain Pain - Right/Left: Right Pain - part of body: Shoulder(ribs)     Time: 0301-4996 PT Time Calculation (min) (ACUTE ONLY): 26 min  Charges:  $Gait Training: 8-22 mins                     Sanjay Broadfoot B. Migdalia Dk PT, DPT Acute Rehabilitation Services Pager [phone] Office [phone]    [place] 12/20/2018, 12:40 PM

## 2018-12-20 NOTE — Clinical Social Work Placement (Signed)
   CLINICAL SOCIAL WORK PLACEMENT  NOTE  Date:  12/20/2018  Patient Details  Name: Laurie [name] MRN: [id number] Date of Birth: 1935-06-03  Clinical Social Work is seeking post-discharge placement for this patient at the [place] level of care (*CSW will initial, date and re-position this form in  chart as items are completed):  Yes   Patient/family provided with [place] Work Department's list of facilities offering this level of care within the geographic area requested by the patient (or if unable, by the patient's family).  Yes   Patient/family informed of their freedom to choose among providers that offer the needed level of care, that participate in Medicare, Medicaid or managed care program needed by the patient, have an available bed and are willing to accept the patient.  Yes   Patient/family informed of [place]'s ownership interest in [facility] and [facility], as well as of the fact that they are under no obligation to receive care at these facilities.  PASRR submitted to EDS on 12/19/18     PASRR number received on 12/19/18     Existing PASRR number confirmed on       FL2 transmitted to all facilities in geographic area requested by pt/family on 12/19/18     FL2 transmitted to all facilities within larger geographic area on       Patient informed that his/her managed care company has contracts with or will negotiate with certain facilities, including the following:        Yes   Patient/family informed of bed offers received.  Patient chooses bed at [facility]     Physician recommends and patient chooses bed at      Patient to be transferred to [facility] on 12/20/18.  Patient to be transferred to facility by [facility]     Patient family notified on 12/20/18 of transfer.  Name of family member notified:  Thayer Headings     PHYSICIAN       Additional Comment:     _______________________________________________ Benard Halsted, LCSW 12/20/2018, 2:18 PM

## 2018-12-20 NOTE — Discharge Summary (Signed)
Physician Discharge Summary  Laurie [name]:[id number] DOB: 1935-08-02 DOA: 12/17/2018  PCP: Hali Marry, MD  Admit date: 12/17/2018 Discharge date: 12/20/2018  Admitted From: Home Disposition:  SNF   Recommendations for Outpatient Follow-up:  1. Follow up with PCP in 1 week  Discharge Condition: Stable CODE STATUS: Full  Diet recommendation: Heart healthy   Brief/Interim Summary: Laurie Hirt Wellbornis a 84 y.o.femalewith history h/othoracicaortic aneurysm, paroxysmal atrial fibrillation, COPD, hypertension, hyperlipidemia,bovineaortic valve replacement on Xarelto who presents to the emergency department aftera fall.Patient lives alone and at baseline ambulates independently without the need for cane or walker. She recalls going to bed feeling fine at 11 PM last night. Little after midnight she felt the urge to use the bathroom and got out of her bed when her leg got trapped in the sheets/bedand she fell onto her right side. She denies hitting her head but is complaining of right shoulder pain 6/10 as well as right lateral chest wall pain at 10/10. Trauma work-up in the ED including CT cervical spine, thoracic spine, CT head negative for any acute injuries/findings. CT chest however shows acute posterolateral sixth and seventh rib fractures with no pneumothorax. Traceright pleural effusion and stable 4.6 cm thoracic aortic aneurysm reported. On arrival, patient's blood pressure was apparently low in the 80s and received IV fluid bolus. Patient also received IV fentanyl 50 mcg and her blood pressure appears to be in the low 100s now and is rating her pain level at 8/10 along the ribs. Additionally, patient was noted to be hypoxic requiring 2 L nasal cannula. Lab work shows leukocytosis of 22,000, AKI with creatinine up to 1.6.  She received IV fluids with improvement in leukocytosis, acute kidney injury.  Oxygen was weaned off and she was on room air on day of  discharge.  PT OT evaluated patient, recommending skilled nursing facility placement.  If patient's right shoulder pain continues to worsen despite therapy, would recommend outpatient orthopedic surgery evaluation.  Right sixth and seventh rib fractures after mechanical fall at home Chest x-ray obtained and reviewed independently, she does have bibasilar atelectasis. Encouraged incentive spirometer PT OT recommending skilled nursing facility placement  Acute hypoxemic respiratory failure Now on room air  AKI Resolved  Chronic atrial fibrillation Continue amiodarone, diltiazem, metoprolol, Xarelto  Chronic diastolic HF Not in acute exacerbation. Resume lasix  HTN Resume lisinopril, norvasc   History of squamous cell lung cancer Status post surgery/lobectomy right middle lobe  Stable 4.6 cm ascending thoracic aortic aneurysm Recommend semi-annual imaging followup by CTA or MRA and referral to cardiothoracic surgery if not already obtained.     Discharge Instructions  Discharge Instructions    Diet - low sodium heart healthy   Complete by:  As directed    Increase activity slowly   Complete by:  As directed      Allergies as of 12/20/2018      Reactions   Hyoscyamine Other (See Comments)   Mental status changes.  (as of 02/13/16 patient denies any problems with this medication)   Rofecoxib Anaphylaxis, Hives   Statins Other (See Comments)   Muscle aches   Metoprolol Other (See Comments)   Extreme fatigue, weakness and brady      Medication List    STOP taking these medications   traMADol 50 MG tablet Commonly known as:  ULTRAM     TAKE these medications   albuterol (2.5 MG/3ML) 0.083% nebulizer solution Commonly known as:  PROVENTIL Take 3 mLs (2.5 mg total)  by nebulization every 4 (four) hours as needed for wheezing or shortness of breath.   ALPRAZolam 0.25 MG tablet Commonly known as:  XANAX TAKE 1-2 TABLETS (0.25MG  TO 0.5MG  TOTAL) BY MOUTH TWICE DAILY  AS NEEDED What changed:    how much to take  how to take this  when to take this  reasons to take this  additional instructions   amiodarone 200 MG tablet Commonly known as:  PACERONE TAKE 1 TABLET BY MOUTH EVERY DAY What changed:  when to take this   amLODipine 5 MG tablet Commonly known as:  NORVASC Take 1 tablet (5 mg total) by mouth daily. What changed:  when to take this   b complex vitamins tablet Take 1 tablet by mouth daily with supper.   diltiazem 180 MG 24 hr capsule Commonly known as:  CARDIZEM CD Take 1 capsule (180 mg total) by mouth daily for 30 days.   furosemide 20 MG tablet Commonly known as:  LASIX Take 1 tablet (20 mg total) by mouth daily.   lidocaine 5 % Commonly known as:  LIDODERM Place 1 patch onto the skin daily. Remove & Discard patch within 12 hours or as directed by MD   lisinopril 40 MG tablet Commonly known as:  PRINIVIL,ZESTRIL TAKE 1 TABLET BY MOUTH EVERY DAY What changed:  when to take this   metoprolol tartrate 50 MG tablet Commonly known as:  LOPRESSOR Take 50 mg by mouth daily.   oxyCODONE 5 MG immediate release tablet Commonly known as:  Oxy IR/ROXICODONE Take 1 tablet (5 mg total) by mouth every 4 (four) hours as needed for up to 5 days for moderate pain or severe pain.   polyethylene glycol powder powder Commonly known as:  GLYCOLAX/MIRALAX Take 17 g by mouth daily. What changed:    when to take this  reasons to take this   STOOL SOFTENER LAXATIVE PO Take 1 capsule by mouth at bedtime as needed (constipation).   XARELTO 20 MG Tabs tablet Generic drug:  rivaroxaban TAKE 1 TABLET (20 MG TOTAL) BY MOUTH DAILY WITH SUPPER. What changed:  See the new instructions.       Contact information for follow-up providers    Hali Marry, MD. Schedule an appointment as soon as possible for a visit in 1 week(s).   Specialty:  Family Medicine Contact information: [address] [place]  [address] [phone]            Contact information for after-discharge care    Destination    [facility] .   Service:  Skilled Nursing Contact information: [place] [address] [phone]                 Allergies  Allergen Reactions  . Hyoscyamine Other (See Comments)    Mental status changes.  (as of 02/13/16 patient denies any problems with this medication)  . Rofecoxib Anaphylaxis and Hives  . Statins Other (See Comments)    Muscle aches  . Metoprolol Other (See Comments)    Extreme fatigue, weakness and brady    Consultations:  None    Procedures/Studies: Dg Chest 2 View  Result Date: 12/19/2018 CLINICAL DATA:  Right-sided rib fracture.  Pleural effusion. EXAM: CHEST - 2 VIEW COMPARISON:  12/06/2018; chest CT-12/17/2018 FINDINGS: Grossly unchanged cardiac silhouette and mediastinal contours post median sternotomy and valve replacement. Stable position of support apparatus. Acute, minimally displaced fractures involving the posterolateral aspects of the  right sixth and seventh ribs with suspected slight increase in trace right-sided pleural effusion. Suspected development of a trace left-sided pleural effusion. Mild pulmonary venous congestion without frank evidence of edema. Slight worsening bibasilar opacities favored to represent atelectasis. Grossly unchanged biapical pleuroparenchymal thickening, right greater than left. No pneumothorax. Regional soft tissues appear normal.  No radiopaque foreign body. IMPRESSION: 1. Acute minimally displaced fractures involving the posterolateral aspects of the right sixth and seventh ribs with slight increase in persistent trace right-sided pleural effusion. No pneumothorax. 2. Interval development of a trace left-sided pleural effusion and worsening bibasilar opacities, likely atelectasis. Electronically Signed   By: Sandi Mariscal M.D.   On: 12/19/2018 11:20   Dg Shoulder  Right  Result Date: 12/17/2018 CLINICAL DATA:  Status post fall.  Right shoulder pain. EXAM: RIGHT SHOULDER - 2+ VIEW COMPARISON:  None. FINDINGS: There is no fracture or dislocation. The glenohumeral joint is normal. There are mild degenerative changes of the acromioclavicular joint. IMPRESSION: No acute osseous injury of the left shoulder. Electronically Signed   By: Kathreen Devoid   On: 12/17/2018 09:59   Ct Head Wo Contrast  Result Date: 12/17/2018 CLINICAL DATA:  Fall EXAM: CT HEAD WITHOUT CONTRAST CT CERVICAL SPINE WITHOUT CONTRAST TECHNIQUE: Multidetector CT imaging of the head and cervical spine was performed following the standard protocol without intravenous contrast. Multiplanar CT image reconstructions of the cervical spine were also generated. COMPARISON:  CT head 10/25/2016 FINDINGS: CT HEAD FINDINGS Brain: Mild atrophy.  Negative for  acute infarct, hemorrhage, mass. Vascular: Negative for hyperdense vessel Skull: Negative for skull fracture Sinuses/Orbits: Mild mucosal edema paranasal sinuses. Right mastoid effusion. Bilateral cataract surgery Other: None CT CERVICAL SPINE FINDINGS Alignment: 2 mm anterolisthesis C4-5 and C7-T1 Skull base and vertebrae: Negative for fracture Soft tissues and spinal canal: Negative for mass or soft tissue swelling. Carotid artery calcification bilaterally. Disc levels: Multilevel disc and facet degeneration throughout the cervical spine. Disc degeneration and spurring causing foraminal stenosis bilaterally Upper chest: Right apical circumferential pleural thickening unchanged from 04/18/2018. Other: None IMPRESSION: 1. No acute intracranial abnormality 2. Negative for cervical spine fracture. Multilevel degenerative change. Electronically Signed   By: Franchot Gallo M.D.   On: 12/17/2018 07:32   Ct Chest Wo Contrast  Result Date: 12/17/2018 CLINICAL DATA:  Fall with right chest wall pain. History of right middle lobectomy for lung cancer in 2009. EXAM: CT  CHEST WITHOUT CONTRAST TECHNIQUE: Multidetector CT imaging of the chest was performed following the standard protocol without IV contrast. COMPARISON:  12/06/2018 chest radiograph. 04/18/2018 chest CT. FINDINGS: Cardiovascular: Normal heart size. No significant pericardial effusion/thickening. Aortic valve prosthesis is in place. Three-vessel coronary atherosclerosis. Atherosclerotic thoracic aorta with stable 4.6 cm ascending thoracic aortic aneurysm. Stable ectatic lower descending thoracic aorta. No evidence of acute intramural hematoma in the thoracic aorta. Top-normal caliber main pulmonary artery (3.1 cm diameter). Mediastinum/Nodes: No discrete thyroid nodules. Unremarkable esophagus. No pathologically enlarged axillary, mediastinal or hilar lymph nodes, noting limited sensitivity for the detection of hilar adenopathy on this noncontrast study. Lungs/Pleura: No pneumothorax. Trace basilar right pleural effusion. Stable posterior right pleural thickening with scattered pleural calcification. No left pleural effusion. Status post right middle lobectomy. Parenchymal bands throughout the basilar right lower lobe. No acute consolidative airspace disease or lung masses. A few scattered solid pulmonary nodules in both lungs, largest 7 mm in posterior left lower lobe (series 6/image 124), all stable since 03/22/2017 chest CT, considered benign. No new significant pulmonary nodules. Upper abdomen: Scattered  pneumobilia in left liver lobe, unchanged. Musculoskeletal: No aggressive appearing focal osseous lesions. Intact sternotomy wires. Acute posterolateral right sixth and seventh rib fractures. IMPRESSION: 1. Acute posterolateral right sixth and seventh rib fractures. No pneumothorax. Trace basilar right pleural effusion. 2. Chronic pleural-parenchymal scarring in the lower right chest. 3. Stable 4.6 cm ascending thoracic aortic aneurysm. Recommend semi-annual imaging followup by CTA or MRA and referral to  cardiothoracic surgery if not already obtained. This recommendation follows 2010 ACCF/AHA/AATS/ACR/ASA/SCA/SCAI/SIR/STS/SVM Guidelines for the Diagnosis and Management of Patients With Thoracic Aortic Disease. Circulation. 2010; 121: D220-U542. Aortic aneurysm NOS (ICD10-I71.9) 4. Three-vessel coronary atherosclerosis. Aortic Atherosclerosis (ICD10-I70.0). Electronically Signed   By: Ilona Sorrel M.D.   On: 12/17/2018 07:28   Ct Cervical Spine Wo Contrast  Result Date: 12/17/2018 CLINICAL DATA:  Fall EXAM: CT HEAD WITHOUT CONTRAST CT CERVICAL SPINE WITHOUT CONTRAST TECHNIQUE: Multidetector CT imaging of the head and cervical spine was performed following the standard protocol without intravenous contrast. Multiplanar CT image reconstructions of the cervical spine were also generated. COMPARISON:  CT head 10/25/2016 FINDINGS: CT HEAD FINDINGS Brain: Mild atrophy.  Negative for  acute infarct, hemorrhage, mass. Vascular: Negative for hyperdense vessel Skull: Negative for skull fracture Sinuses/Orbits: Mild mucosal edema paranasal sinuses. Right mastoid effusion. Bilateral cataract surgery Other: None CT CERVICAL SPINE FINDINGS Alignment: 2 mm anterolisthesis C4-5 and C7-T1 Skull base and vertebrae: Negative for fracture Soft tissues and spinal canal: Negative for mass or soft tissue swelling. Carotid artery calcification bilaterally. Disc levels: Multilevel disc and facet degeneration throughout the cervical spine. Disc degeneration and spurring causing foraminal stenosis bilaterally Upper chest: Right apical circumferential pleural thickening unchanged from 04/18/2018. Other: None IMPRESSION: 1. No acute intracranial abnormality 2. Negative for cervical spine fracture. Multilevel degenerative change. Electronically Signed   By: Franchot Gallo M.D.   On: 12/17/2018 07:32   Ct Thoracic Spine Wo Contrast  Result Date: 12/17/2018 CLINICAL DATA:  Fall.  Back pain EXAM: CT THORACIC SPINE WITHOUT CONTRAST TECHNIQUE:  Multidetector CT images of the thoracic were obtained using the standard protocol without intravenous contrast. COMPARISON:  CT chest 04/18/2018 FINDINGS: Alignment: Mild anterolisthesis T1-2, T2-3, T3-4. Accentuated dorsal kyphosis. Vertebrae: Negative for fracture or Paraspinal and other soft tissues: Advanced atherosclerotic thoracic aorta. Aortic valve replacement. COPD with bronchial thickening. Right middle lobe scarring and postoperative change. 6 mm left lower lobe nodule stable from prior studies and appearing benign. Disc levels: Multilevel disc degeneration throughout the thoracic spine. No significant spinal stenosis. IMPRESSION: Negative for fracture Electronically Signed   By: Franchot Gallo M.D.   On: 12/17/2018 07:23     Discharge Exam: Vitals:   12/20/18 0522 12/20/18 1246  BP: (!) 145/73 127/68  Pulse: 65 69  Resp: 16 18  Temp: 98.3 F (36.8 C) 98.1 F (36.7 C)  SpO2: 98% 98%    General: Pt is alert, awake, not in acute distress Cardiovascular: RRR, S1/S2 +, no rubs, no gallops Respiratory: CTA bilaterally, no wheezing, no rhonchi Abdominal: Soft, NT, ND, bowel sounds + Extremities: no edema, no cyanosis    The results of significant diagnostics from this hospitalization (including imaging, microbiology, ancillary and laboratory) are listed below for reference.     Microbiology: No results found for this or any previous visit (from the past 240 hour(s)).   Labs: BNP (last 3 results) No results for input(s): BNP in the last 8760 hours. Basic Metabolic Panel: Recent Labs  Lab 12/17/18 0745 12/18/18 0515 12/20/18 0300  NA 133* 131* 129*  K  4.1 4.5 4.0  CL 98 97* 93*  CO2 20* 26 24  GLUCOSE 159* 102* 91  BUN 12 12 6*  CREATININE 1.62* 1.18* 0.79  CALCIUM 9.1 8.4* 8.8*   Liver Function Tests: Recent Labs  Lab 12/17/18 0745  AST 44*  ALT 33  ALKPHOS 71  BILITOT 0.7  PROT 5.3*  ALBUMIN 3.0*   No results for input(s): LIPASE, AMYLASE in the last  168 hours. No results for input(s): AMMONIA in the last 168 hours. CBC: Recent Labs  Lab 12/17/18 0745 12/18/18 0515 12/18/18 1218 12/20/18 0300  WBC 22.4* 12.7*  --  10.1  NEUTROABS 19.4*  --   --   --   HGB 14.3 12.0 12.5 12.4  HCT 46.1* 37.5 39.1 37.6  MCV 90.6 87.8  --  86.2  PLT 269 194  --  181   Cardiac Enzymes: No results for input(s): CKTOTAL, CKMB, CKMBINDEX, TROPONINI in the last 168 hours. BNP: Invalid input(s): POCBNP CBG: No results for input(s): GLUCAP in the last 168 hours. D-Dimer No results for input(s): DDIMER in the last 72 hours. Hgb A1c No results for input(s): HGBA1C in the last 72 hours. Lipid Profile No results for input(s): CHOL, HDL, LDLCALC, TRIG, CHOLHDL, LDLDIRECT in the last 72 hours. Thyroid function studies No results for input(s): TSH, T4TOTAL, T3FREE, THYROIDAB in the last 72 hours.  Invalid input(s): FREET3 Anemia work up No results for input(s): VITAMINB12, FOLATE, FERRITIN, TIBC, IRON, RETICCTPCT in the last 72 hours. Urinalysis    Component Value Date/Time   COLORURINE YELLOW (A) 12/17/2018 1057   APPEARANCEUR CLOUDY (A) 12/17/2018 1057   LABSPEC 1.015 12/17/2018 1057   PHURINE 6.0 12/17/2018 1057   GLUCOSEU NEGATIVE 12/17/2018 1057   HGBUR NEGATIVE 12/17/2018 1057   HGBUR trace-intact 06/06/2008 0907   BILIRUBINUR SMALL (A) 12/17/2018 1057   BILIRUBINUR neg 09/20/2017 1633   KETONESUR TRACE (A) 12/17/2018 1057   PROTEINUR 100 (A) 12/17/2018 1057   UROBILINOGEN 2.0 (A) 09/20/2017 1633   UROBILINOGEN 0.2 07/13/2011 1832   NITRITE NEGATIVE 12/17/2018 1057   LEUKOCYTESUR NEGATIVE 12/17/2018 1057   Sepsis Labs Invalid input(s): PROCALCITONIN,  WBC,  LACTICIDVEN Microbiology No results found for this or any previous visit (from the past 240 hour(s)).   Patient was seen and examined on the day of discharge and was found to be in stable condition. Time coordinating discharge: 35 minutes including assessment and coordination of  care, as well as examination of the patient.   SIGNED:  Dessa Phi, DO [facility] [email or web address] 12/20/2018, 1:02 PM

## 2018-12-20 NOTE — Progress Notes (Signed)
Occupational Therapy Treatment Patient Details Name: Laurie [name] MRN: [id number] DOB: 1934-12-15 Today's Date: 12/20/2018    History of present illness 84 y.o. female with history h/o thoracic aortic aneurysm, paroxysmal atrial fibrillation, COPD, hypertension, hyperlipidemia, bovine aortic valve replacement on Xarelto who presents to the emergency department after a fall. CT chest shows acute posterolateral sixth and seventh rib fxs with no pneumothorax. CT of right shoulder shows no acute fx.    OT comments  Pt progressing towards established OT goals and performed sponge bath with Min A. Pt requiring Max A for donning bedroom slippers due to pain with bending forward. Pt continues to report significant pain at right shoulder and presents with limited AROM/PROM as seen during ADLs and testing. Pt continues to be motivated to participate in therapy and return to PLOF. Feel pt would benefit from a sling for comfort and to decrease pain; RN notified. Continue to recommend dc to SNF and will continue to follow acutely as admitted.     Follow Up Recommendations  SNF;Supervision/Assistance - 24 hour    Equipment Recommendations  Other (comment)(Defer to next venue)    Recommendations for Other Services PT consult    Precautions / Restrictions Precautions Precautions: Other (comment) Precaution Comments: Right rib fx Restrictions Weight Bearing Restrictions: No Other Position/Activity Restrictions: Per MD note, right shoulder no fxs       Mobility Bed Mobility               General bed mobility comments: Sitting at EOB performing bath upon arrival  Transfers Overall transfer level: Needs assistance Equipment used: 2 person hand held assist Transfers: Sit to/from Stand Sit to Stand: Min assist;+2 physical assistance;+2 safety/equipment         General transfer comment: Min A for power up into standing. Pt holding pillow for rib pain and right shoulder pain. +2 for  safety    Balance Overall balance assessment: Needs assistance Sitting-balance support: No upper extremity supported;Feet supported Sitting balance-Leahy Scale: Fair     Standing balance support: Bilateral upper extremity supported;During functional activity Standing balance-Leahy Scale: Poor Standing balance comment: reliant on physical A                           ADL either performed or assessed with clinical judgement   ADL Overall ADL's : Needs assistance/impaired         Upper Body Bathing: Minimal assistance;Sitting Upper Body Bathing Details (indicate cue type and reason): Min A for bathing her back. Pt using compensatory techniques for to wash under axillary areas. Lower Body Bathing: Minimal assistance;Maximal assistance;Sit to/from stand Lower Body Bathing Details (indicate cue type and reason): Pt requiring Max A for washing her feet. With Min A for standing balance, pt able to wash peri care with LUE Upper Body Dressing : Minimal assistance;Sitting Upper Body Dressing Details (indicate cue type and reason): Providing education on compensatory tehcniques for UB dressing. Requiring Min A for managing gown but able to demosntrate tehcniques Lower Body Dressing: Maximal assistance;Sit to/from stand Lower Body Dressing Details (indicate cue type and reason): Max A for donning slippers Toilet Transfer: Minimal assistance;+2 for safety/equipment;Ambulation(simulated to recliner) Armed forces technical officer Details (indicate cue type and reason): Min A for power up and safe descent         Functional mobility during ADLs: Minimal assistance;Min guard;+2 for safety/equipment General ADL Comments: Pt continues to report significant pain at her right shoulder     Vision  Perception     Praxis      Cognition Arousal/Alertness: Awake/alert Behavior During Therapy: WFL for tasks assessed/performed Overall Cognitive Status: Within Functional Limits for tasks  assessed                                          Exercises Exercises: General Upper Extremity General Exercises - Upper Extremity Wrist Flexion: AROM;Right;10 reps;Seated Wrist Extension: AROM;Right;10 reps;Seated   Shoulder Instructions       General Comments SpO2 97% on RA. Feel pt would benefit from ortho consult due to continued significant pain at right shoulder. Contacted attending [place], DO.     Pertinent Vitals/ Pain       Pain Assessment: 0-10 Pain Score: 10-Worst pain ever Pain Location: Right shoulder Pain Descriptors / Indicators: Constant;Discomfort;Grimacing;Guarding;Sharp Pain Intervention(s): Monitored during session;Limited activity within patient's tolerance;Repositioned  Home Living                                          Prior Functioning/Environment              Frequency  Min 2X/week        Progress Toward Goals  OT Goals(current goals can now be found in the care plan section)  Progress towards OT goals: Progressing toward goals  Acute Rehab OT Goals Patient Stated Goal: "Return home to my dog" OT Goal Formulation: With patient Time For Goal Achievement: 01/01/19 Potential to Achieve Goals: Good ADL Goals Pt Will Perform Upper Body Dressing: with set-up;with supervision;sitting Pt Will Perform Lower Body Dressing: with min guard assist;sit to/from stand;with adaptive equipment Pt Will Transfer to Toilet: with min guard assist;bedside commode;ambulating Pt Will Perform Toileting - Clothing Manipulation and hygiene: with min guard assist;sit to/from stand Additional ADL Goal #1: Pt will perform bed mobility with supervision in preparation for ADLs  Plan Discharge plan remains appropriate    Co-evaluation    PT/OT/SLP Co-Evaluation/Treatment: Yes Reason for Co-Treatment: Complexity of the patient's impairments (multi-system involvement);For patient/therapist safety;To address functional/ADL transfers    OT goals addressed during session: ADL's and self-care      AM-PAC OT "6 Clicks" Daily Activity     Outcome Measure   Help from another person eating meals?: None Help from another person taking care of personal grooming?: A Little Help from another person toileting, which includes using toliet, bedpan, or urinal?: A Little Help from another person bathing (including washing, rinsing, drying)?: A Little Help from another person to put on and taking off regular upper body clothing?: A Little Help from another person to put on and taking off regular lower body clothing?: A Lot 6 Click Score: 18    End of Session    OT Visit Diagnosis: Unsteadiness on feet (R26.81);Other abnormalities of gait and mobility (R26.89);Muscle weakness (generalized) (M62.81);Pain Pain - Right/Left: Right Pain - part of body: (Ribs)   Activity Tolerance Patient tolerated treatment well;Patient limited by pain   Patient Left in chair;with call bell/phone within reach;with chair alarm set   Nurse Communication Mobility status;Patient requests pain meds;Other (comment)(Order sling for comfort)        Time: 1610-9604 OT Time Calculation (min): 28 min  Charges: OT General Charges $OT Visit: 1 Visit OT Treatments $Self Care/Home Management : 8-22 mins  Dniya Neuhaus MSOT, OTR/L  Acute Rehab Pager: [phone] Office: [place] 12/20/2018, 11:09 AM

## 2018-12-20 NOTE — Progress Notes (Signed)
Patient will DC to: [place] date: 12/20/2018 Family notified: Thayer Headings and son Transport by: Domenica Reamer   Per MD patient ready for DC to [place]. RN, patient, patient's family, and facility notified of DC. Discharge Summary and FL2 sent to facility. RN to call report prior to discharge [phone] Room 1205P). DC packet on chart. Ambulance transport requested for patient.   CSW will sign off for now as social work intervention is no longer needed. Please consult [place] again if new needs arise.  Cedric Fishman, LCSW Clinical Social Worker [phone]

## 2018-12-20 NOTE — Progress Notes (Addendum)
Occupational Therapy Treatment Patient Details Name: Laurie [name] MRN: [id number] DOB: 02/08/1935 Today's Date: 12/20/2018    History of present illness 84 y.o. female with history h/o thoracic aortic aneurysm, paroxysmal atrial fibrillation, COPD, hypertension, hyperlipidemia, bovine aortic valve replacement on Xarelto who presents to the emergency department after a fall. CT chest shows acute posterolateral sixth and seventh rib fxs with no pneumothorax. CT of right shoulder shows no acute fx.    OT comments  Returned for second visit to provided education on sling management. Educating pt on sling donning/doffing, positioning, and wear during functional mobility. Pt requiring Mod A for sling adjustments. Pt able to perform functional mobility with single hand held A presenting with increased tolerance. Continue to recommend dc to SNF and will continue to follow acutely as admitted.    Follow Up Recommendations  SNF;Supervision/Assistance - 24 hour    Equipment Recommendations  Other (comment)(Defer to next venue)    Recommendations for Other Services PT consult    Precautions / Restrictions Precautions Precautions: Other (comment) Precaution Comments: Right rib fx Restrictions Weight Bearing Restrictions: No Other Position/Activity Restrictions: Per MD note, right shoulder no fxs       Mobility Bed Mobility               General bed mobility comments: Sitting in recliner upon arrival  Transfers Overall transfer level: Needs assistance Equipment used: 1 person hand held assist Transfers: Sit to/from Stand Sit to Stand: Min guard;Min assist         General transfer comment: Min GUard A for safety. Single hand held A for dyanmic standing balance    Balance Overall balance assessment: Needs assistance Sitting-balance support: No upper extremity supported;Feet supported Sitting balance-Leahy Scale: Fair     Standing balance support: Bilateral upper  extremity supported;During functional activity Standing balance-Leahy Scale: Fair Standing balance comment: Able to maintain static standing                           ADL either performed or assessed with clinical judgement   ADL Overall ADL's : Needs assistance/impaired                 Upper Body Dressing : Moderate assistance;Sitting Upper Body Dressing Details (indicate cue type and reason): Providing education for managing sling. Educating pt to wear sling when mobilizing for comfort and to reduce pain. Pt requiring Mod A for adjusting sling.      Toilet Transfer: Min guard;Ambulation;Minimal assistance(simulated to recliner) Armed forces technical officer Details (indicate cue type and reason): Min Guard A for safety and Min A for single hand held A in mobility         Functional mobility during ADLs: Minimal assistance;Min guard(single hand held A) General ADL Comments: Returning for second visit to educate pt on sling management.      Vision       Perception     Praxis      Cognition Arousal/Alertness: Awake/alert Behavior During Therapy: WFL for tasks assessed/performed Overall Cognitive Status: Within Functional Limits for tasks assessed                                          Exercises     Shoulder Instructions       General Comments Discussing importance for ROM at right UE    Pertinent Vitals/ Pain  Pain Assessment: Faces Faces Pain Scale: Hurts whole lot Pain Location: Right shoulder Pain Descriptors / Indicators: Constant;Discomfort;Grimacing;Guarding;Sharp Pain Intervention(s): Monitored during session;Limited activity within patient's tolerance;Repositioned;Premedicated before session  Home Living                                          Prior Functioning/Environment              Frequency  Min 2X/week        Progress Toward Goals  OT Goals(current goals can now be found in the care plan  section)  Progress towards OT goals: Progressing toward goals  Acute Rehab OT Goals Patient Stated Goal: "Return home to my dog" OT Goal Formulation: With patient Time For Goal Achievement: 01/01/19 Potential to Achieve Goals: Good ADL Goals Pt Will Perform Upper Body Dressing: with set-up;with supervision;sitting Pt Will Perform Lower Body Dressing: with min guard assist;sit to/from stand;with adaptive equipment Pt Will Transfer to Toilet: with min guard assist;bedside commode;ambulating Pt Will Perform Toileting - Clothing Manipulation and hygiene: with min guard assist;sit to/from stand Additional ADL Goal #1: Pt will perform bed mobility with supervision in preparation for ADLs  Plan Discharge plan remains appropriate    Co-evaluation                 AM-PAC OT "6 Clicks" Daily Activity     Outcome Measure   Help from another person eating meals?: None Help from another person taking care of personal grooming?: A Little Help from another person toileting, which includes using toliet, bedpan, or urinal?: A Little Help from another person bathing (including washing, rinsing, drying)?: A Little Help from another person to put on and taking off regular upper body clothing?: A Little Help from another person to put on and taking off regular lower body clothing?: A Lot 6 Click Score: 18    End of Session Equipment Utilized During Treatment: Other (comment)(Sling)  OT Visit Diagnosis: Unsteadiness on feet (R26.81);Other abnormalities of gait and mobility (R26.89);Muscle weakness (generalized) (M62.81);Pain Pain - Right/Left: Right Pain - part of body: (Ribs)   Activity Tolerance Patient tolerated treatment well   Patient Left in chair;with call bell/phone within reach   Nurse Communication Mobility status        Time: 7209-4709 OT Time Calculation (min): 9 min  Charges: OT General Charges $OT Visit: 1 Visit OT Treatments $Self Care/Home Management : 8-22  mins  Arya Luttrull MSOT, OTR/L Acute Rehab Pager: [phone] Office: [place] 12/20/2018, 5:11 PM

## 2018-12-20 NOTE — Consult Note (Signed)
            [facility] CM Primary Care Navigator  12/20/2018  Laurie [name] [birth year] [id number]   Attempt to see patientat the bedside to identify possible discharge needs butshe [facility]already [facility]  Per MD note,patientpresented to the ER after a fall. (right sixth and seventh rib fractures after mechanical fall at home, acute hypoxemic respiratory failure, HTN)  Patient has discharge instruction to follow-upwith primary care provider in 1 week.    For additional questions please contact:  Edwena Felty A. Shemika Robbs, BSN, RN-BC [facility] PRIMARY CARE Navigator Cell: [phone]

## 2018-12-21 ENCOUNTER — Ambulatory Visit: Payer: Medicare HMO

## 2018-12-21 DIAGNOSIS — S2241XB Multiple fractures of ribs, right side, initial encounter for open fracture: Secondary | ICD-10-CM | POA: Diagnosis not present

## 2018-12-21 DIAGNOSIS — I480 Paroxysmal atrial fibrillation: Secondary | ICD-10-CM | POA: Diagnosis not present

## 2018-12-21 DIAGNOSIS — I712 Thoracic aortic aneurysm, without rupture: Secondary | ICD-10-CM | POA: Diagnosis not present

## 2018-12-21 DIAGNOSIS — J984 Other disorders of lung: Secondary | ICD-10-CM | POA: Diagnosis not present

## 2018-12-22 ENCOUNTER — Other Ambulatory Visit: Payer: Self-pay

## 2018-12-22 DIAGNOSIS — E871 Hypo-osmolality and hyponatremia: Secondary | ICD-10-CM | POA: Diagnosis not present

## 2018-12-22 DIAGNOSIS — I5032 Chronic diastolic (congestive) heart failure: Secondary | ICD-10-CM | POA: Diagnosis not present

## 2018-12-22 DIAGNOSIS — S2241XB Multiple fractures of ribs, right side, initial encounter for open fracture: Secondary | ICD-10-CM | POA: Diagnosis not present

## 2018-12-22 NOTE — Patient Outreach (Signed)
[facility]) Care Management  12/22/2018  Laurie [name] March 29, 1935 [id number]     Transition of Care Referral  Referral Date: 12/22/2018 Referral Source: [facility] Discharge Report Date of Admission: 12/17/2018 Diagnosis: fall Date of Discharge: 12/20/2018 Facility: [place]    Referral received. Upon chat review, patient noted to have discharged from hospital to [facility] for rehab.     Plan: RN CM will close out referral at this time as patient remains in a facility.   Enzo Montgomery, RN,BSN,CCM [place] Management Telephonic Care Management Coordinator Direct Phone: [phone] Toll Free: [phone] Fax: [phone]

## 2018-12-23 ENCOUNTER — Telehealth: Payer: Self-pay | Admitting: Cardiology

## 2018-12-23 NOTE — Telephone Encounter (Signed)
New Message    Patient returning phone call

## 2018-12-23 NOTE — Telephone Encounter (Signed)
Attempted to contact patient back regarding medications. Someone picked up, but did not say anything,. Will attempt at another time.

## 2018-12-23 NOTE — Telephone Encounter (Signed)
Patient called, states that she is at a facility now and they are just giving her the medications and she is unsure of what all the medications are, and she feels bad. After discussing with patient the medications on her list. It was noted that patient was to stop Metorpolol and start Cardizem, patients med list was fixed. I advised patient to ask facility what medications she was taking, as she had a right to know, and if they needed an updated list to contact our office. Patient verbalized understanding.

## 2018-12-23 NOTE — Telephone Encounter (Signed)
New message    Pt is in [facility] and stated she picked up medications and dont know what they are. I asked her what her concerns are and what medications and she said she doesn't know. I tried to get as much information but nothing. She just sad she feels worse and the nurse that gave her medications doesn't know what they are either.

## 2018-12-26 DIAGNOSIS — R0989 Other specified symptoms and signs involving the circulatory and respiratory systems: Secondary | ICD-10-CM | POA: Diagnosis not present

## 2018-12-26 DIAGNOSIS — E871 Hypo-osmolality and hyponatremia: Secondary | ICD-10-CM | POA: Diagnosis not present

## 2018-12-26 DIAGNOSIS — I480 Paroxysmal atrial fibrillation: Secondary | ICD-10-CM | POA: Diagnosis not present

## 2018-12-27 DIAGNOSIS — I5032 Chronic diastolic (congestive) heart failure: Secondary | ICD-10-CM | POA: Diagnosis not present

## 2018-12-27 DIAGNOSIS — I480 Paroxysmal atrial fibrillation: Secondary | ICD-10-CM | POA: Diagnosis not present

## 2018-12-27 DIAGNOSIS — E871 Hypo-osmolality and hyponatremia: Secondary | ICD-10-CM | POA: Diagnosis not present

## 2018-12-27 DIAGNOSIS — S2241XB Multiple fractures of ribs, right side, initial encounter for open fracture: Secondary | ICD-10-CM | POA: Diagnosis not present

## 2018-12-29 DIAGNOSIS — I712 Thoracic aortic aneurysm, without rupture: Secondary | ICD-10-CM | POA: Diagnosis not present

## 2018-12-29 DIAGNOSIS — F3489 Other specified persistent mood disorders: Secondary | ICD-10-CM | POA: Diagnosis not present

## 2018-12-29 DIAGNOSIS — I480 Paroxysmal atrial fibrillation: Secondary | ICD-10-CM | POA: Diagnosis not present

## 2018-12-29 DIAGNOSIS — J984 Other disorders of lung: Secondary | ICD-10-CM | POA: Diagnosis not present

## 2019-01-04 ENCOUNTER — Other Ambulatory Visit: Payer: Self-pay | Admitting: *Deleted

## 2019-01-04 NOTE — Patient Outreach (Signed)
Laurie [name]) Care Management  01/04/2019  Laurie [name] [birth year] [id number]   Subjective: Telephone call to patient's home  / mobile number, no answer, left HIPAA compliant voicemail message, and requested call back.     Objective: Per KPN (Knowledge Performance Now, point of care tool) and chart review, patient hospitalized 12/17/2018 -12/20/2018 for Right sixth and seventh rib fractures after mechanical fall at home.   Discharge from hospital to [facility] and Rehab, discharged from rehab on 12/29/2018.     Patient also has a history of COPD, chronic diastolic congestive heart failure, hypertension, hyperlipidemia, and atrial fibrillation.        Assessment: Received [employer] Transition of care follow up referral on 01/03/2019.   Transition of care follow up pending patient contact.       Plan: RNCM will send unsuccessful outreach letter, Laurie [name] pamphlet, will call patient for 2nd telephone outreach attempt within 4 business days, transition of care follow up, and proceed with case closure, within 10 business days if no return call.       Laurie [name], BSN, [place] Management [facility] Telephonic CM Phone: [phone] Fax: [phone]

## 2019-01-06 ENCOUNTER — Other Ambulatory Visit: Payer: Self-pay | Admitting: *Deleted

## 2019-01-06 NOTE — Patient Outreach (Signed)
[facility]) Care Management  01/06/2019  Laurie [name] [birth year] [id number]   Subjective: Telephone call to patient's home / mobile number, spoke with patient, and HIPAA verified.  Discussed [place] Transition of care follow up, patient voiced understanding, and is in agreement to follow up at a later time.   Patient states she does not hear well, wants this RNCM to call back on 01/09/2019 morning when daughter-in- law is available to assist her with answering questions, not feeling well this morning, will follow up with primary MD as needed, does not feel like talking, and has called primary MD's office to verify [place] Management services.   Patient states she was discharged from [facility] and [place] on 12/29/2018 because she was unable to participate in therapies.   States she does not have home health services at that time, does not feel that her insurance will pay for any other services.    RNCM advised [facility] Care Management services are at no charge to patient, advised patient that coverage of other services would be determine by insurance company,  and patient voiced understanding.  RNCM advised patient will call back at a later time to complete transition of care assessment, patient voiced understanding, and is in agreement.      Objective: Per KPN (Knowledge Performance Now, point of care tool) and chart review, patient hospitalized 12/17/2018 -12/20/2018 for Right sixth and seventh rib fractures after mechanical fall at home. Discharge from hospital to [facility] and Rehab, discharged from rehab on 12/29/2018.     Patient also has a history of COPD, chronic diastolic congestive heart failure, hypertension, hyperlipidemia, and atrial fibrillation.        Assessment: Received [employer] Transition of care follow up referral on 01/03/2019.   Transition of care follow up pending patient contact.       Plan: RNCM has sent unsuccessful outreach  letter, [facility] pamphlet, will call patient for 3rd telephone outreach attempt within 4 business days, transition of care follow up, and proceed with case closure, within 10 business days if no return call.      Aleks Nawrot H. Annia Friendly, BSN, [place] Management [facility] Telephonic CM Phone: [phone] Fax: [phone]

## 2019-01-09 ENCOUNTER — Other Ambulatory Visit: Payer: Self-pay | Admitting: *Deleted

## 2019-01-09 NOTE — Patient Outreach (Addendum)
[facility]) Care Management  01/09/2019  Laurie [name] [birth year] [id number]   Subjective: Telephone call to patient's home  / mobile number times 2, no answer, left HIPAA compliant voicemail message, and requested call back.    Objective:Per KPN (Knowledge Performance Now, point of care tool) and chart review,patient hospitalized 12/17/2018 -12/20/2018 forRight sixth and seventh rib fractures after mechanical fall at home. Discharge from hospital to [facility] and Rehab, discharged from rehab on 12/29/2018. Patient also has a history of COPD, chronic diastolic congestive heart failure, hypertension, hyperlipidemia, and atrial fibrillation.      Assessment: Received [employer] Transition of care follow up referral on 01/03/2019.Transition of care follow up pending patient contact.      Plan:RNCM has sent unsuccessful outreach letter, [facility] pamphlet, will proceed with case closure, within 10 business days if no return call.      Carman Essick H. Annia Friendly, BSN, [place] Management [facility] Telephonic CM Phone: [phone] Fax: [phone]

## 2019-01-10 ENCOUNTER — Other Ambulatory Visit: Payer: Self-pay | Admitting: *Deleted

## 2019-01-10 NOTE — Patient Outreach (Signed)
[facility]) Care Management  01/10/2019  Laurie [name] 11-13-1935 [id number]   Subjective: Received voicemail from patient states she is returning call, has received a letter from this RNCM, needing assistance, and requested call back. Telephone call to patient's home  / mobile number, no answer, left HIPAA compliant voicemail message, and requested call back. Telephone call from  patient's home / mobile number, spoke with patient, and HIPAA verified.  Discussed [facility] Care Management [employer] of care follow up, patient voiced understanding, and is in agreement to follow up at a later time.   States she does not hear well, requested call back when daughter-in-law is available on 01/17/2019 at 10:00am at patient's home, and declined to complete assessment at this time due to daughter -in-law being out of town.   RNCM advised patient will call back on 01/17/2019 per her request if available not on a call with another patient, RNCM will not be doing in person visit, will only do telephonic assessment, patient voices understanding, if patient does no receive a call at 10:00am on 01/17/2019, patient will call this RNCM.       Objective:Per KPN (Knowledge Performance Now, point of care tool) and chart review,patient hospitalized 12/17/2018 -12/20/2018 forRight sixth and seventh rib fractures after mechanical fall at home. Discharge from hospital to [facility] and Rehab, discharged from rehab on 12/29/2018. Patient also has a history of COPD, chronic diastolic congestive heart failure, hypertension, hyperlipidemia, and atrial fibrillation.      Assessment: Received [employer] Transition of care follow up referral on 01/03/2019.Transition of care follow up pending patient contact.      Plan:RNCMhassentunsuccessful outreach letter, [facility] pamphlet, will proceed with case closure, within 10 business days if no return call.      Laurie [name] H. Annia Friendly, BSN, [place] Management [facility] Telephonic CM Phone: [phone] Fax: [phone]

## 2019-01-12 ENCOUNTER — Encounter: Payer: Self-pay | Admitting: *Deleted

## 2019-01-12 ENCOUNTER — Other Ambulatory Visit: Payer: Self-pay

## 2019-01-12 ENCOUNTER — Emergency Department (INDEPENDENT_AMBULATORY_CARE_PROVIDER_SITE_OTHER)
Admission: EM | Admit: 2019-01-12 | Discharge: 2019-01-12 | Disposition: A | Discharge status: Home / Self Care | Payer: Medicare HMO | Source: Home / Self Care

## 2019-01-12 DIAGNOSIS — R300 Dysuria: Secondary | ICD-10-CM

## 2019-01-12 DIAGNOSIS — N3000 Acute cystitis without hematuria: Secondary | ICD-10-CM | POA: Diagnosis not present

## 2019-01-12 LAB — POCT URINALYSIS DIP (MANUAL ENTRY)
Bilirubin, UA: NEGATIVE
Glucose, UA: NEGATIVE mg/dL
Ketones, POC UA: NEGATIVE mg/dL
NITRITE UA: POSITIVE — AB
Protein Ur, POC: NEGATIVE mg/dL
Spec Grav, UA: 1.01 (ref 1.010–1.025)
UROBILINOGEN UA: 0.2 U/dL
pH, UA: 5.5 (ref 5.0–8.0)

## 2019-01-12 MED ORDER — CEPHALEXIN 500 MG PO CAPS: 500.0000 mg | ORAL_CAPSULE | Freq: Four times a day (QID) | ORAL | 0 refills | Status: DC

## 2019-01-12 NOTE — ED Triage Notes (Signed)
Pt c/o lower abd pain and dysuria x 1 wk. She took OxyContin at 1545 today.

## 2019-01-12 NOTE — ED Provider Notes (Signed)
Laurie [name] CARE    CSN: [id number] Arrival date & time: 01/12/19  1825     History   Chief Complaint Chief Complaint  Patient presents with  . Abdominal Pain  . Dysuria    HPI Laurie [name] is a 84 y.o. female.   The history is provided by the patient. No language interpreter was used.  Abdominal Pain  Pain location:  Suprapubic Pain quality: aching   Pain radiates to:  Does not radiate Pain severity:  Mild Onset quality:  Gradual Timing:  Constant Progression:  Worsening Chronicity:  New Relieved by:  Nothing Worsened by:  Nothing Ineffective treatments:  None tried Associated symptoms: dysuria   Risk factors: being elderly   Dysuria  Associated symptoms: abdominal pain   Pt complains of burning with urination.  No fever,   Past Medical History:  Diagnosis Date  . AAA (abdominal aortic aneurysm) ([place])   . Anemia    iron deficiency  . Ascending aorta dilatation (HCC)   . Atrial fibrillation ([place])    post op  . CAD (coronary artery disease)   . Carcinoma of lung ([place]) 08/28/2008   T2N0 moderately diff. squamous cell CA  . Cerebrovascular disease   . COPD (chronic obstructive pulmonary disease) ([place]) 2005   on CXR with R apical scaring  . Headache(784.0)    Hx: of migraines in the past  . Hypercholesterolemia    Hx: of  . Hypertension   . Incidental pulmonary nodule, > 98mm and < 9mm    LLL  . S/P aortic valve replacement 08/28/2008   [facility]  . S/P lobectomy of lung 08/28/2008   rt middle lobe  dr. Roxy Manns  . Shingles 11-09    Patient Active Problem List   Diagnosis Date Noted  . Rib fracture 12/19/2018  . Fall 12/17/2018  . Tachy-brady syndrome ([place]) 12/05/2018  . Low grade squamous intraepithelial lesion (LGSIL) on cervicovaginal cytologic smear 04/08/2018  . Paroxysmal atrial fibrillation ([place]) 02/25/2018  . Sinus bradycardia 02/25/2018  . Incidental pulmonary nodule, > 80mm and < 49mm   . Claudication ([place]) 04/22/2016  . Dyspnea     . Atrial fibrillation with RVR ([place])   . COPD (chronic obstructive pulmonary disease) ([place])   . RUQ abdominal pain   . Biliary calculus   . Pulmonary emphysema ([place])   . Aortic valve disease   . Thoracic aortic aneurysm without rupture ([place]) 08/19/2015  . Ascending aorta dilatation (HCC)   . Abdominal aortic aneurysm ([place]) 08/26/2011  . HYPERGLYCEMIA, MILD 11/10/2010  . OSTEOPENIA 09/29/2010  . Coronary atherosclerosis 05/15/2009  . Bilateral carotid artery disease ([place]) 05/15/2009  . TIA 05/15/2009  . ILIAC ARTERY ANEURYSM 05/15/2009  . GAD (generalized anxiety disorder) 03/20/2009  . THYROID NODULE 01/09/2009  . THYROMEGALY 01/08/2009  . SIALOLITHIASIS 01/08/2009  . MALIGNANT NEOPLASM MIDDLE LOBE BRONCHUS OR LUNG 10/24/2008  . HLD (hyperlipidemia) 10/24/2008  . AORTIC VALVE REPLACEMENT, HX OF 10/24/2008  . S/P aortic valve replacement 08/28/2008  . DIVERTICULOSIS OF COLON 06/19/2008  . ANEMIA, IRON DEFICIENCY 06/06/2008  . Essential hypertension, benign 06/06/2008  . GASTROINTESTINAL HEMORRHAGE, HX OF 06/06/2008    Past Surgical History:  Procedure Laterality Date  . ABDOMINAL SURGERY    . AORTIC VALVE REPLACEMENT  08/28/2008   #68mm [facility] Ease pericardial tissue valve  . APPENDECTOMY    . CARDIAC CATHETERIZATION    . CATARACT EXTRACTION W/ INTRAOCULAR LENS  IMPLANT, BILATERAL Bilateral   . CHOLECYSTECTOMY N/A 12/13/2013   Procedure:  LAPAROSCOPIC CHOLECYSTECTOMY;  Surgeon: Ralene Ok, MD;  Location: [facility];  Service: General;  Laterality: N/A;  . COLONOSCOPY     \  . DILATION AND CURETTAGE OF UTERUS    . ERCP N/A 05/08/2015   Procedure: ENDOSCOPIC RETROGRADE CHOLANGIOPANCREATOGRAPHY (ERCP);  Surgeon: Inda Castle, MD;  Location: [facility] [facility];  Service: Endoscopy;  Laterality: N/A;  . ERCP N/A 01/13/2016   Procedure: ENDOSCOPIC RETROGRADE CHOLANGIOPANCREATOGRAPHY (ERCP);  Surgeon: Doran Stabler, MD;  Location[facility];  Service: Endoscopy;   Laterality: N/A;  . ERCP N/A 03/09/2016   Procedure: ENDOSCOPIC RETROGRADE CHOLANGIOPANCREATOGRAPHY (ERCP);  Surgeon: Ladene Artist, MD;  Location: [facility];  Service: Endoscopy;  Laterality: N/A;  . PACEMAKER IMPLANT N/A 12/05/2018   Procedure: PACEMAKER IMPLANT;  Surgeon: Evans Lance, MD;  Location: [facility];  Service: Cardiovascular;  Laterality: N/A;  . RML removed Dr Ricard Dillon for lung cancer  08/28/2008   T2N0 squamous cell CA  . TONSILLECTOMY AND ADENOIDECTOMY    . u/s guided aspiration of R breast cyst   2009   at the breast clinic    OB History    Gravida  2   Para  2   Term      Preterm      AB      Living        SAB      TAB      Ectopic      Multiple      Live Births  2            Home Medications    Prior to Admission medications   Medication Sig Start Date End Date Taking? Authorizing Provider  albuterol (PROVENTIL) (2.5 MG/3ML) 0.083% nebulizer solution Take 3 mLs (2.5 mg total) by nebulization every 4 (four) hours as needed for wheezing or shortness of breath. 12/01/18   Noe Gens, PA-C  ALPRAZolam Duanne Moron) 0.25 MG tablet TAKE 1-2 TABLETS (0.25MG  TO 0.5MG  TOTAL) BY MOUTH TWICE DAILY AS NEEDED 12/20/18   Dessa Phi, DO  amiodarone (PACERONE) 200 MG tablet TAKE 1 TABLET BY MOUTH EVERY DAY Patient taking differently: Take 200 mg by mouth daily before breakfast.  06/07/18   Lelon Perla, MD  amLODipine (NORVASC) 5 MG tablet Take 1 tablet (5 mg total) by mouth daily. Patient taking differently: Take 5 mg by mouth daily after supper.  05/10/18   Lelon Perla, MD  b complex vitamins tablet Take 1 tablet by mouth daily with supper.    [provider]  cephALEXin (KEFLEX) 500 MG capsule Take 1 capsule (500 mg total) by mouth 4 (four) times daily for 10 days. 01/12/19 01/22/19  Fransico Meadow, PA-C  diltiazem (CARDIZEM CD) 180 MG 24 hr capsule Take 1 capsule (180 mg total) by mouth daily for 30 days. 12/12/18 01/11/19  Evans Lance, MD  Docusate Sodium (STOOL SOFTENER LAXATIVE PO) Take 1 capsule by mouth at bedtime as needed (constipation).     [provider]  furosemide (LASIX) 20 MG tablet Take 1 tablet (20 mg total) by mouth daily. 06/30/18 02/03/19  Lelon Perla, MD  lidocaine (LIDODERM) 5 % Place 1 patch onto the skin daily. Remove & Discard patch within 12 hours or as directed by MD 12/20/18   Dessa Phi, DO  lisinopril (PRINIVIL,ZESTRIL) 40 MG tablet TAKE 1 TABLET BY MOUTH EVERY DAY Patient taking differently: Take 40 mg by mouth daily before breakfast.  11/07/18  Lelon Perla, MD  polyethylene glycol powder (GLYCOLAX/MIRALAX) powder Take 17 g by mouth daily. Patient taking differently: Take 17 g by mouth daily as needed (constipation).  08/11/18   Danis, Kirke Corin, MD  XARELTO 20 MG TABS tablet TAKE 1 TABLET (20 MG TOTAL) BY MOUTH DAILY WITH SUPPER. Patient taking differently: Take 20 mg by mouth daily after supper. High risk med: Anticoagulant.  Crushed Xarelto can be given down a G-tube but NOT a J-Tube. 11/17/18   Lelon Perla, MD    Family History Family History  Problem Relation Age of Onset  . Stroke Father   . Diabetes Son        brittle diabetes  . Colon polyps Neg Hx   . Colon cancer Neg Hx   . Esophageal cancer Neg Hx   . Stomach cancer Neg Hx   . Pancreatic cancer Neg Hx   . Kidney disease Neg Hx   . Liver disease Neg Hx     Social History Social History   Tobacco Use  . Smoking status: Former Smoker    Years: 40.00  . Smokeless tobacco: Never Used  Substance Use Topics  . Alcohol use: No    Alcohol/week: 0.0 standard drinks  . Drug use: No     Allergies   Hyoscyamine; Rofecoxib; Statins; and Metoprolol   Review of Systems Review of Systems  Gastrointestinal: Positive for abdominal pain.  Genitourinary: Positive for dysuria.  All other systems reviewed and are negative.    Physical Exam Triage Vital Signs ED Triage Vitals  Enc Vitals  Group     BP 01/12/19 1848 132/67     Pulse Rate 01/12/19 1848 74     Resp 01/12/19 1848 18     Temp 01/12/19 1848 97.6 F (36.4 C)     Temp Source 01/12/19 1848 Oral     SpO2 01/12/19 1848 92 %     Weight 01/12/19 1849 172 lb (78 kg)     Height 01/12/19 1849 5\' 6"  (1.676 m)     Head Circumference --      Peak Flow --      Pain Score 01/12/19 1848 0     Pain Loc --      Pain Edu? --      Excl. in [place]? --    No data found.  Updated Vital Signs BP 132/67 (BP Location: Right Arm)   Pulse 74   Temp 97.6 F (36.4 C) (Oral)   Resp 18   Ht 5\' 6"  (1.676 m)   Wt 78 kg   SpO2 92%   BMI 27.76 kg/m   Visual Acuity Right Eye Distance:   Left Eye Distance:   Bilateral Distance:    Right Eye Near:   Left Eye Near:    Bilateral Near:     Physical Exam Vitals signs and nursing note reviewed.  Constitutional:      Appearance: She is well-developed.  HENT:     Head: Normocephalic.  Neck:     Musculoskeletal: Normal range of motion.  Cardiovascular:     Rate and Rhythm: Normal rate.  Pulmonary:     Effort: Pulmonary effort is normal.  Abdominal:     General: Abdomen is flat. Bowel sounds are normal. There is no distension.     Tenderness: There is abdominal tenderness in the suprapubic area.     Hernia: No hernia is present.  Musculoskeletal: Normal range of motion.  Neurological:     Mental  Status: She is alert and oriented to person, place, and time.      UC Treatments / Results  Labs (all labs ordered are listed, but only abnormal results are displayed) Labs Reviewed  POCT URINALYSIS DIP (MANUAL ENTRY) - Abnormal; Notable for the following components:      Result Value   Blood, UA trace-intact (*)    Nitrite, UA Positive (*)    Leukocytes, UA Small (1+) (*)    All other components within normal limits  URINE CULTURE    EKG None  Radiology No results found.  Procedures Procedures (including critical care time)  Medications Ordered in UC Medications -  No data to display  Initial Impression / Assessment and Plan / UC Course  I have reviewed the triage vital signs and the nursing notes.  Pertinent labs & imaging results that were available during my care of the patient were reviewed by me and considered in my medical decision making (see chart for details).     MDM urine culture ordered.  I will start pt on keflex.   Final Clinical Impressions(s) / UC Diagnoses   Final diagnoses:  Acute cystitis without hematuria  Dysuria     Discharge Instructions     Return if any problems.     ED Prescriptions    Medication Sig Dispense Auth. Provider   cephALEXin (KEFLEX) 500 MG capsule Take 1 capsule (500 mg total) by mouth 4 (four) times daily for 10 days. 28 capsule Fransico Meadow, [place]     Controlled Substance Prescriptions [place] Controlled Substance Registry consulted? Not Applicable  An After Visit Summary was printed and given to the patient.    Fransico Meadow, [place] 01/12/19 1900

## 2019-01-12 NOTE — Discharge Instructions (Signed)
Return if any problems.

## 2019-01-13 ENCOUNTER — Ambulatory Visit: Payer: Medicare HMO | Admitting: Family Medicine

## 2019-01-13 ENCOUNTER — Encounter: Payer: Self-pay | Admitting: Family Medicine

## 2019-01-13 ENCOUNTER — Ambulatory Visit (INDEPENDENT_AMBULATORY_CARE_PROVIDER_SITE_OTHER): Payer: Medicare HMO | Admitting: Family Medicine

## 2019-01-13 VITALS — BP 127/59 | HR 58 | Ht 66.0 in | Wt 168.0 lb

## 2019-01-13 DIAGNOSIS — I5189 Other ill-defined heart diseases: Secondary | ICD-10-CM | POA: Diagnosis not present

## 2019-01-13 DIAGNOSIS — I480 Paroxysmal atrial fibrillation: Secondary | ICD-10-CM

## 2019-01-13 DIAGNOSIS — N179 Acute kidney failure, unspecified: Secondary | ICD-10-CM

## 2019-01-13 DIAGNOSIS — I77810 Thoracic aortic ectasia: Secondary | ICD-10-CM

## 2019-01-13 DIAGNOSIS — S2241XA Multiple fractures of ribs, right side, initial encounter for closed fracture: Secondary | ICD-10-CM | POA: Diagnosis not present

## 2019-01-13 DIAGNOSIS — I10 Essential (primary) hypertension: Secondary | ICD-10-CM | POA: Diagnosis not present

## 2019-01-13 MED ORDER — OXYCODONE HCL 5 MG PO TABS: 5.0000 mg | ORAL_TABLET | Freq: Four times a day (QID) | ORAL | 0 refills | Status: DC | PRN

## 2019-01-13 NOTE — Progress Notes (Signed)
Subjective:    CC:   HPI:  84 year old female is here today for recent hospitalization on January 18.Laurie [name] reports that after her last OV she fell in on her R side trying to get out of bed in the middle of the night.  She thinks she actually lost consciousness but was able to get to her phone that which was on her nightstand.  EMS came and actually transported her to the emergency department.  When she arrived she was hypovolemic and hypoxic.  She was given IV fluids and placed on oxygen.  Eventually weaned from the oxygen.  They found that she had actually fractured her sixth and seventh ribs.  They recommended PT and OT and so was discharged to a skilled nursing facility on January 21.  She was set unable to tell me how long she was there but she is back home now.  She still in a lot of pain with her rib fractures.  She has had a sebaceous cyst on her right mid to upper back area for quite some time.  But she says more recently it is been painful and hurting and radiating to the front of her chest.  She is only using Tylenol for pain.  The discharge note does report that they do feel like she probably injured her right shoulder during the fall as well and recommended further evaluation if she continued to have pain.  She also went to Urgent Care yesterday for UTI and was diagnosed and treated. Urine culture is still pending.    While hospitalized her kidney function jumped up to a creatinine of 1.6 but did come back down to baseline before discharge.  Hypertension- Pt denies chest pain, SOB, dizziness, or heart palpitations.  Taking meds as directed w/o problems.  Denies medication side effects.      Hospital D/C summary:   Brief/Interim Summary: Laurie Noyce Wellbornis a 84 y.o.femalewith history h/othoracicaortic aneurysm, paroxysmal atrial fibrillation, COPD, hypertension, hyperlipidemia,bovineaortic valve replacement on Xarelto who presents to the emergency department aftera  fall.Patient lives alone and at baseline ambulates independently without the need for cane or walker. She recalls going to bed feeling fine at 11 PM last night. Little after midnight she felt the urge to use the bathroom and got out of her bed when her leg got trapped in the sheets/bedand she fell onto her right side. She denies hitting her head but is complaining of right shoulder pain 6/10 as well as right lateral chest wall pain at 10/10. Trauma work-up in the ED including CT cervical spine, thoracic spine, CT head negative for any acute injuries/findings. CT chest however shows acute posterolateral sixth and seventh rib fractures with no pneumothorax. Traceright pleural effusion and stable 4.6 cm thoracic aortic aneurysm reported. On arrival, patient's blood pressure was apparently low in the 80s and received IV fluid bolus. Patient also received IV fentanyl 50 mcg and her blood pressure appears to be in the low 100s now and is rating her pain level at 8/10 along the ribs. Additionally, patient was noted to be hypoxic requiring 2 L nasal cannula. Lab work shows leukocytosis of 22,000, AKI with creatinine up to 1.6.  She received IV fluids with improvement in leukocytosis, acute kidney injury.  Oxygen was weaned off and she was on room air on day of discharge.  PT OT evaluated patient, recommending skilled nursing facility placement.  If patient's right shoulder pain continues to worsen despite therapy, would recommend outpatient orthopedic surgery evaluation.  Right sixth and seventh rib fractures after mechanical fall at home Chest x-ray obtained and reviewed independently, she does have bibasilar atelectasis. Encouraged incentive spirometer PT OT recommending skilled nursing facility placement  Acute hypoxemic respiratory failure Now on room air  AKI Resolved  Chronic atrial fibrillation Continue amiodarone, diltiazem, metoprolol, Xarelto  Chronic diastolic HF Not in acute  exacerbation. Resume lasix  HTN Resume lisinopril, norvasc   History of squamous cell lung cancer Status post surgery/lobectomy right middle lobe  Stable 4.6 cm ascending thoracic aortic aneurysm Recommend semi-annual imaging followup by CTA or MRA and referral to cardiothoracic surgery if not already obtained.   Past medical history, Surgical history, Family history not pertinant except as noted below, Social history, Allergies, and medications have been entered into the medical record, reviewed, and corrections made.   Review of Systems: No fevers, chills, night sweats, weight loss, chest pain, or shortness of breath.   Objective:    General: Well Developed, well nourished, and in no acute distress.  Neuro: Alert and oriented x3, extra-ocular muscles intact, sensation grossly intact.  HEENT: Normocephalic, atraumatic  Skin: Warm and dry, no rashes.  Does have a pigmented sebaceous cyst on the right upper back between the shoulder blade and the spine. Cardiac: Regular rate and rhythm, no murmurs rubs or gallops, no lower extremity edema. No carotid bruits.   Respiratory: Clear to auscultation bilaterally. Not using accessory muscles, speaking in full sentences. Musculoskeletal-nontender over the thoracic spine and nontender over the paraspinous muscles.  Did not palpate her rib cage because of the fractures today.   Impression and Recommendations:   Right sixth and seventh rib fractures-did give her a small quantity of oxycodone to use sparingly and she can even split them if needed.  Recommend continuing OT and PT.  Thoracic aortic aneurysm-more recent scan showed that it was measuring 4.6 cm.  Will compare to previous scans that I have on file.  She was following with Dr. Roxy Manns at vascular surgery until this last year when he actually released her because the aneurysm had been fairly stable over the last 10 years.  Previous CT from May 2019 actually measured 4.7 cm which reassures  me that the aneurysm is stable.  She did have an acute kidney injury while there but it resolved before discharge. Will follow renal function routinely.   Chronic atrial fibrillation-currently on amiodarone, diltiazem and Xarelto.  She did not tolerate metoprolol.  We have made sure to add that to her intolerance list.  Diastolic heart failure-no recent exacerbations she is taking Lasix.  Hypertension-well controlled today.

## 2019-01-14 LAB — URINE CULTURE
MICRO NUMBER:: 192136
SPECIMEN QUALITY:: ADEQUATE

## 2019-01-15 ENCOUNTER — Telehealth: Payer: Self-pay | Admitting: Emergency Medicine

## 2019-01-15 NOTE — Telephone Encounter (Signed)
Spoke with patient who is not feeling better; per Dr.Beese will call Bactrim DS bid x 5 days to [facility].

## 2019-01-17 ENCOUNTER — Other Ambulatory Visit: Payer: Self-pay | Admitting: *Deleted

## 2019-01-17 ENCOUNTER — Other Ambulatory Visit: Payer: Self-pay

## 2019-01-17 ENCOUNTER — Emergency Department (HOSPITAL_COMMUNITY): Payer: Medicare HMO

## 2019-01-17 ENCOUNTER — Emergency Department (INDEPENDENT_AMBULATORY_CARE_PROVIDER_SITE_OTHER)
Admission: EM | Admit: 2019-01-17 | Discharge: 2019-01-17 | Disposition: A | Discharge status: Other Acute Inpatient Hospital | Payer: Medicare HMO | Source: Home / Self Care

## 2019-01-17 ENCOUNTER — Emergency Department (HOSPITAL_COMMUNITY)
Admission: EM | Admit: 2019-01-17 | Discharge: 2019-01-17 | Disposition: A | Discharge status: Home / Self Care | Payer: Medicare HMO | Attending: Emergency Medicine | Admitting: Emergency Medicine

## 2019-01-17 DIAGNOSIS — I2510 Atherosclerotic heart disease of native coronary artery without angina pectoris: Secondary | ICD-10-CM | POA: Diagnosis not present

## 2019-01-17 DIAGNOSIS — R1030 Lower abdominal pain, unspecified: Secondary | ICD-10-CM

## 2019-01-17 DIAGNOSIS — Z87440 Personal history of urinary (tract) infections: Secondary | ICD-10-CM

## 2019-01-17 DIAGNOSIS — R531 Weakness: Secondary | ICD-10-CM | POA: Diagnosis not present

## 2019-01-17 DIAGNOSIS — Z87891 Personal history of nicotine dependence: Secondary | ICD-10-CM | POA: Insufficient documentation

## 2019-01-17 DIAGNOSIS — R079 Chest pain, unspecified: Secondary | ICD-10-CM | POA: Insufficient documentation

## 2019-01-17 DIAGNOSIS — Z79899 Other long term (current) drug therapy: Secondary | ICD-10-CM | POA: Diagnosis not present

## 2019-01-17 DIAGNOSIS — E871 Hypo-osmolality and hyponatremia: Secondary | ICD-10-CM | POA: Diagnosis not present

## 2019-01-17 DIAGNOSIS — R1084 Generalized abdominal pain: Secondary | ICD-10-CM | POA: Diagnosis not present

## 2019-01-17 DIAGNOSIS — I959 Hypotension, unspecified: Secondary | ICD-10-CM | POA: Diagnosis not present

## 2019-01-17 DIAGNOSIS — I10 Essential (primary) hypertension: Secondary | ICD-10-CM | POA: Diagnosis not present

## 2019-01-17 DIAGNOSIS — R062 Wheezing: Secondary | ICD-10-CM

## 2019-01-17 DIAGNOSIS — J449 Chronic obstructive pulmonary disease, unspecified: Secondary | ICD-10-CM | POA: Insufficient documentation

## 2019-01-17 DIAGNOSIS — R300 Dysuria: Secondary | ICD-10-CM | POA: Diagnosis not present

## 2019-01-17 DIAGNOSIS — R110 Nausea: Secondary | ICD-10-CM | POA: Diagnosis not present

## 2019-01-17 DIAGNOSIS — Z950 Presence of cardiac pacemaker: Secondary | ICD-10-CM | POA: Insufficient documentation

## 2019-01-17 DIAGNOSIS — S2241XD Multiple fractures of ribs, right side, subsequent encounter for fracture with routine healing: Secondary | ICD-10-CM

## 2019-01-17 DIAGNOSIS — R0902 Hypoxemia: Secondary | ICD-10-CM | POA: Diagnosis not present

## 2019-01-17 DIAGNOSIS — I499 Cardiac arrhythmia, unspecified: Secondary | ICD-10-CM | POA: Diagnosis not present

## 2019-01-17 DIAGNOSIS — Z8619 Personal history of other infectious and parasitic diseases: Secondary | ICD-10-CM

## 2019-01-17 LAB — COMPREHENSIVE METABOLIC PANEL
ALT: 24 U/L (ref 0–44)
AST: 28 U/L (ref 15–41)
Albumin: 3.8 g/dL (ref 3.5–5.0)
Alkaline Phosphatase: 92 U/L (ref 38–126)
Anion gap: 11 (ref 5–15)
BUN: 9 mg/dL (ref 8–23)
CO2: 25 mmol/L (ref 22–32)
Calcium: 9.4 mg/dL (ref 8.9–10.3)
Chloride: 91 mmol/L — ABNORMAL LOW (ref 98–111)
Creatinine, Ser: 1.11 mg/dL — ABNORMAL HIGH (ref 0.44–1.00)
GFR calc Af Amer: 54 mL/min — ABNORMAL LOW (ref >60)
GFR calc non Af Amer: 47 mL/min — ABNORMAL LOW (ref >60)
Glucose, Bld: 105 mg/dL — ABNORMAL HIGH (ref 70–99)
POTASSIUM: 5.1 mmol/L (ref 3.5–5.1)
Sodium: 127 mmol/L — ABNORMAL LOW (ref 135–145)
Total Bilirubin: 1 mg/dL (ref 0.3–1.2)
Total Protein: 6.7 g/dL (ref 6.5–8.1)

## 2019-01-17 LAB — CBC WITH DIFFERENTIAL/PLATELET
Abs Immature Granulocytes: 0.02 10*3/uL (ref 0.00–0.07)
BASOS PCT: 1 %
Basophils Absolute: 0.1 10*3/uL (ref 0.0–0.1)
EOS ABS: 0.2 10*3/uL (ref 0.0–0.5)
Eosinophils Relative: 3 %
HCT: 42.6 % (ref 36.0–46.0)
Hemoglobin: 13.8 g/dL (ref 12.0–15.0)
Immature Granulocytes: 0 %
LYMPHS ABS: 1 10*3/uL (ref 0.7–4.0)
Lymphocytes Relative: 14 %
MCH: 28.5 pg (ref 26.0–34.0)
MCHC: 32.4 g/dL (ref 30.0–36.0)
MCV: 87.8 fL (ref 80.0–100.0)
Monocytes Absolute: 0.9 10*3/uL (ref 0.1–1.0)
Monocytes Relative: 12 %
Neutro Abs: 5 10*3/uL (ref 1.7–7.7)
Neutrophils Relative %: 70 %
PLATELETS: 240 10*3/uL (ref 150–400)
RBC: 4.85 MIL/uL (ref 3.87–5.11)
RDW: 13.4 % (ref 11.5–15.5)
WBC: 7.1 10*3/uL (ref 4.0–10.5)
nRBC: 0 % (ref 0.0–0.2)

## 2019-01-17 LAB — URINALYSIS, ROUTINE W REFLEX MICROSCOPIC
Bacteria, UA: NONE SEEN
Bilirubin Urine: NEGATIVE
Glucose, UA: NEGATIVE mg/dL
Hgb urine dipstick: NEGATIVE
KETONES UR: NEGATIVE mg/dL
Nitrite: NEGATIVE
PH: 7 (ref 5.0–8.0)
Protein, ur: NEGATIVE mg/dL
Specific Gravity, Urine: 1.004 — ABNORMAL LOW (ref 1.005–1.030)

## 2019-01-17 LAB — LIPASE, BLOOD: Lipase: 27 U/L (ref 11–51)

## 2019-01-17 MED ORDER — SODIUM CHLORIDE 0.9 % IV BOLUS
1000.0000 mL | Freq: Once | INTRAVENOUS | Status: AC
  Administered 2019-01-17: 1000 mL via INTRAVENOUS

## 2019-01-17 MED ORDER — ONDANSETRON HCL 4 MG/2ML IJ SOLN
4.0000 mg | Freq: Once | INTRAMUSCULAR | Status: AC
  Administered 2019-01-17: 4 mg via INTRAVENOUS
  Filled 2019-01-17: qty 2

## 2019-01-17 MED ORDER — IOHEXOL 300 MG/ML  SOLN
100.0000 mL | Freq: Once | INTRAMUSCULAR | Status: AC | PRN
  Administered 2019-01-17: 100 mL via INTRAVENOUS

## 2019-01-17 MED ORDER — PHENAZOPYRIDINE HCL 200 MG PO TABS: 200.0000 mg | ORAL_TABLET | Freq: Three times a day (TID) | ORAL | 0 refills | Status: DC

## 2019-01-17 NOTE — Patient Outreach (Signed)
[facility]) Care Management  01/17/2019  Laurie [name] 04-10-1935 [id number]   Received voicemail message from Berenice Bouton, states she is really sick, has an acute urinary tract infection, not up to doing anything at time, and is unable to participate in a call.  Telephone call to patient's home / mobile number, spoke with patient, and HIPAA verified.  Patient states she is not feeling in any better, thought she would feel better after medication change on 2/16/20202, is aware of signs / symptoms to report to MD, is planning to call MD today to give an update on her status, and has no care management needs from this RNCM at this time.   States she will follow up if needed in the future and does not feel up to talking at this time.   States she is very appreciative of the follow up.      Tramond Slinker H. Annia Friendly, BSN, [place] [facility] Telephonic CM Phone: [phone] Fax: [phone]

## 2019-01-17 NOTE — ED Triage Notes (Signed)
[facility]- pt was diagnosed and treated for UTI last Thursday with a med change to keflex on Sunday. Pt reports feeling no better went back to UC today for lower abd pain over bladder with burning upon urination. Pt states she constantly feels like she has to urinate and unable to. Pt also feels nauseous

## 2019-01-17 NOTE — ED Provider Notes (Signed)
[facility] EMERGENCY DEPARTMENT Provider Note   CSN: [id number] Arrival date & time: 01/17/19  1451    History   Chief Complaint Chief Complaint  Patient presents with  . Abdominal Pain    HPI Laurie [name] is a 84 y.o. female who presents with abdominal pain. PMH significant for A.fib on Xarelto, CAD, COPD, HTN, HLD, hx of lung cancer s/p right middle lobectomy. She states that for the past 2 weeks she has had "bladder pain" and dysuria. She went to UC on 2/13 and was diagnosed with a UTI and put on Keflex. When the culture (grew out Enterobacter cloacae) came back it was changed to Bactrim which she's been taking. She states she's had ongoing symptoms and feels "worse". She states she's had generalized malaise, chills, nausea, worsening pain and despite drinking a lot of fluids she is not urinating. She felt like she was going to pass out at [facility] today. She lives alone at home. Her O2 has been running ~90% at home and she states that she is still having right sided rib pain from a fall and subsequent rib fractures in January. She denies fever and has not been coughing or short of breath. She has been using her incentive spirometer. She reports hx of cholecystectomy and appendectomy.    HPI  Past Medical History:  Diagnosis Date  . AAA (abdominal aortic aneurysm) ([place])   . Anemia    iron deficiency  . Ascending aorta dilatation (HCC)   . Atrial fibrillation ([place])    post op  . CAD (coronary artery disease)   . Carcinoma of lung ([place]) 08/28/2008   T2N0 moderately diff. squamous cell CA  . Cerebrovascular disease   . COPD (chronic obstructive pulmonary disease) ([place]) 2005   on CXR with R apical scaring  . Headache(784.0)    Hx: of migraines in the past  . Hypercholesterolemia    Hx: of  . Hypertension   . Incidental pulmonary nodule, > 43mm and < 30mm    LLL  . S/P aortic valve replacement 08/28/2008   owen  . S/P lobectomy of lung 08/28/2008   rt  middle lobe  dr. Roxy Manns  . Shingles 11-09    Patient Active Problem List   Diagnosis Date Noted  . Rib fracture 12/19/2018  . Fall 12/17/2018  . Tachy-brady syndrome ([place]) 12/05/2018  . Low grade squamous intraepithelial lesion (LGSIL) on cervicovaginal cytologic smear 04/08/2018  . Paroxysmal atrial fibrillation ([place]) 02/25/2018  . Sinus bradycardia 02/25/2018  . Incidental pulmonary nodule, > 78mm and < 72mm   . Claudication ([place]) 04/22/2016  . Dyspnea   . Atrial fibrillation with RVR ([place])   . COPD (chronic obstructive pulmonary disease) ([place])   . RUQ abdominal pain   . Biliary calculus   . Pulmonary emphysema ([place])   . Aortic valve disease   . Thoracic aortic aneurysm without rupture ([place]) 08/19/2015  . Ascending aorta dilatation (HCC)   . Abdominal aortic aneurysm ([place]) 08/26/2011  . HYPERGLYCEMIA, MILD 11/10/2010  . OSTEOPENIA 09/29/2010  . Coronary atherosclerosis 05/15/2009  . Bilateral carotid artery disease ([place]) 05/15/2009  . TIA 05/15/2009  . ILIAC ARTERY ANEURYSM 05/15/2009  . GAD (generalized anxiety disorder) 03/20/2009  . THYROID NODULE 01/09/2009  . THYROMEGALY 01/08/2009  . SIALOLITHIASIS 01/08/2009  . MALIGNANT NEOPLASM MIDDLE LOBE BRONCHUS OR LUNG 10/24/2008  . HLD (hyperlipidemia) 10/24/2008  . AORTIC VALVE REPLACEMENT, HX OF 10/24/2008  . S/P aortic valve replacement 08/28/2008  . DIVERTICULOSIS OF  COLON 06/19/2008  . ANEMIA, IRON DEFICIENCY 06/06/2008  . Essential hypertension, benign 06/06/2008  . GASTROINTESTINAL HEMORRHAGE, HX OF 06/06/2008    Past Surgical History:  Procedure Laterality Date  . ABDOMINAL SURGERY    . AORTIC VALVE REPLACEMENT  08/28/2008   #22mm [facility] Ease pericardial tissue valve  . APPENDECTOMY    . CARDIAC CATHETERIZATION    . CATARACT EXTRACTION W/ INTRAOCULAR LENS  IMPLANT, BILATERAL Bilateral   . CHOLECYSTECTOMY N/A 12/13/2013   Procedure: LAPAROSCOPIC CHOLECYSTECTOMY;  Surgeon: Ralene Ok, MD;  Location: [place];  Service: General;  Laterality: N/A;  . COLONOSCOPY     \  . DILATION AND CURETTAGE OF UTERUS    . ERCP N/A 05/08/2015   Procedure: ENDOSCOPIC RETROGRADE CHOLANGIOPANCREATOGRAPHY (ERCP);  Surgeon: Inda Castle, MD;  Location: [place];  Service: Endoscopy;  Laterality: N/A;  . ERCP N/A 01/13/2016   Procedure: ENDOSCOPIC RETROGRADE CHOLANGIOPANCREATOGRAPHY (ERCP);  Surgeon: Doran Stabler, MD;  Location[facility];  Service: Endoscopy;  Laterality: N/A;  . ERCP N/A 03/09/2016   Procedure: ENDOSCOPIC RETROGRADE CHOLANGIOPANCREATOGRAPHY (ERCP);  Surgeon: Ladene Artist, MD;  Location: [facility];  Service: Endoscopy;  Laterality: N/A;  . PACEMAKER IMPLANT N/A 12/05/2018   Procedure: PACEMAKER IMPLANT;  Surgeon: Evans Lance, MD;  Location: [place] [facility];  Service: Cardiovascular;  Laterality: N/A;  . RML removed Dr Ricard Dillon for lung cancer  08/28/2008   T2N0 squamous cell CA  . TONSILLECTOMY AND ADENOIDECTOMY    . u/s guided aspiration of R breast cyst   2009   at the breast clinic     OB History    Gravida  2   Para  2   Term      Preterm      AB      Living        SAB      TAB      Ectopic      Multiple      Live Births  2            Home Medications    Prior to Admission medications   Medication Sig Start Date End Date Taking? Authorizing Provider  albuterol (PROVENTIL) (2.5 MG/3ML) 0.083% nebulizer solution Take 3 mLs (2.5 mg total) by nebulization every 4 (four) hours as needed for wheezing or shortness of breath. 12/01/18   Noe Gens, PA-C  ALPRAZolam Duanne Moron) 0.25 MG tablet TAKE 1-2 TABLETS (0.25MG  TO 0.5MG  TOTAL) BY MOUTH TWICE DAILY AS NEEDED 12/20/18   Dessa Phi, DO  amiodarone (PACERONE) 200 MG tablet TAKE 1 TABLET BY MOUTH EVERY DAY Patient taking differently: Take 200 mg by mouth daily before breakfast.  06/07/18   Lelon Perla, MD  amLODipine (NORVASC) 5 MG tablet Take 1 tablet (5 mg total) by mouth daily. Patient taking  differently: Take 5 mg by mouth daily after supper.  05/10/18   Lelon Perla, MD  b complex vitamins tablet Take 1 tablet by mouth daily with supper.    [provider]  cephALEXin (KEFLEX) 500 MG capsule Take 1 capsule (500 mg total) by mouth 4 (four) times daily for 10 days. 01/12/19 01/22/19  Fransico Meadow, PA-C  diltiazem (CARDIZEM CD) 180 MG 24 hr capsule Take 1 capsule (180 mg total) by mouth daily for 30 days. 12/12/18 01/13/19  Evans Lance, MD  Docusate Sodium (STOOL SOFTENER LAXATIVE PO) Take 1 capsule by mouth at bedtime as needed (constipation).  [provider]  furosemide (LASIX) 20 MG tablet Take 1 tablet (20 mg total) by mouth daily. 06/30/18 02/03/19  Lelon Perla, MD  lisinopril (PRINIVIL,ZESTRIL) 40 MG tablet TAKE 1 TABLET BY MOUTH EVERY DAY Patient taking differently: Take 40 mg by mouth daily before breakfast.  11/07/18   Stanford Breed, Denice Bors, MD  oxyCODONE (OXY IR/ROXICODONE) 5 MG immediate release tablet Take 1 tablet (5 mg total) by mouth every 6 (six) hours as needed for up to 5 days for severe pain. 01/13/19 01/18/19  Hali Marry, MD  polyethylene glycol powder (GLYCOLAX/MIRALAX) powder Take 17 g by mouth daily. Patient taking differently: Take 17 g by mouth daily as needed (constipation).  08/11/18   Danis, Kirke Corin, MD  XARELTO 20 MG TABS tablet TAKE 1 TABLET (20 MG TOTAL) BY MOUTH DAILY WITH SUPPER. Patient taking differently: Take 20 mg by mouth daily after supper. High risk med: Anticoagulant.  Crushed Xarelto can be given down a G-tube but NOT a J-Tube. 11/17/18   Lelon Perla, MD    Family History Family History  Problem Relation Age of Onset  . Stroke Father   . Diabetes Son        brittle diabetes  . Colon polyps Neg Hx   . Colon cancer Neg Hx   . Esophageal cancer Neg Hx   . Stomach cancer Neg Hx   . Pancreatic cancer Neg Hx   . Kidney disease Neg Hx   . Liver disease Neg Hx     Social History Social History    Tobacco Use  . Smoking status: Former Smoker    Years: 40.00  . Smokeless tobacco: Never Used  Substance Use Topics  . Alcohol use: No    Alcohol/week: 0.0 standard drinks  . Drug use: No     Allergies   Hyoscyamine; Rofecoxib; Statins; and Metoprolol   Review of Systems Review of Systems  Constitutional: Positive for chills and fatigue. Negative for fever.  Respiratory: Negative for cough and shortness of breath.   Cardiovascular: Positive for chest pain (R rib).  Gastrointestinal: Positive for abdominal pain, constipation and nausea. Negative for diarrhea and vomiting.  Genitourinary: Positive for difficulty urinating and dysuria. Negative for flank pain, frequency, hematuria and pelvic pain.  Musculoskeletal: Negative for back pain.  Neurological: Positive for light-headedness. Negative for syncope.  All other systems reviewed and are negative.    Physical Exam Updated Vital Signs BP 113/62   Pulse (!) 59   Temp 98 F (36.7 C) (Oral)   Resp 19   SpO2 96%   Physical Exam Vitals signs and nursing note reviewed.  Constitutional:      General: She is not in acute distress.    Appearance: She is well-developed.     Comments: Calm, cooperative. On 2L O2 via [place]  HENT:     Head: Normocephalic and atraumatic.  Eyes:     General: No scleral icterus.       Right eye: No discharge.        Left eye: No discharge.     Conjunctiva/sclera: Conjunctivae normal.     Pupils: Pupils are equal, round, and reactive to light.  Neck:     Musculoskeletal: Normal range of motion.  Cardiovascular:     Rate and Rhythm: Normal rate and regular rhythm.     Comments: Recent appearing scar from pacemaker placement in LU chest wall Pulmonary:     Effort: Pulmonary effort is normal. No respiratory distress.  Breath sounds: Normal breath sounds.  Abdominal:     General: Abdomen is protuberant. Bowel sounds are normal. There is no distension.     Palpations: Abdomen is soft.      Tenderness: There is abdominal tenderness in the suprapubic area.     Hernia: No hernia is present.     Comments: Prior well healed surgical scar  Skin:    General: Skin is warm and dry.  Neurological:     Mental Status: She is alert and oriented to person, place, and time.  Psychiatric:        Behavior: Behavior normal.      ED Treatments / Results  Labs (all labs ordered are listed, but only abnormal results are displayed) Labs Reviewed  COMPREHENSIVE METABOLIC PANEL - Abnormal; Notable for the following components:      Result Value   Sodium 127 (*)    Chloride 91 (*)    Glucose, Bld 105 (*)    Creatinine, Ser 1.11 (*)    GFR calc non Af Amer 47 (*)    GFR calc Af Amer 54 (*)    All other components within normal limits  URINALYSIS, ROUTINE W REFLEX MICROSCOPIC - Abnormal; Notable for the following components:   Color, Urine STRAW (*)    Specific Gravity, Urine 1.004 (*)    Leukocytes,Ua TRACE (*)    Non Squamous Epithelial 0-5 (*)    All other components within normal limits  CBC WITH DIFFERENTIAL/PLATELET  LIPASE, BLOOD    EKG None  Radiology Dg Chest 2 View  Result Date: 01/17/2019 CLINICAL DATA:  Chills, chest pain, UTI EXAM: CHEST - 2 VIEW COMPARISON:  12/19/2018 FINDINGS: LEFT subclavian transvenous pacemaker with leads projecting at RIGHT atrium and RIGHT ventricle unchanged. Enlargement of cardiac silhouette post AVR. Rotated to the RIGHT. Mediastinal contours and pulmonary vascularity normal. Atherosclerotic calcification aorta. Patchy infiltrates versus atelectasis in the mid RIGHT lung and likely LEFT base. No pleural effusion or pneumothorax. Bones demineralized. Again identified mildly displaced fractures of the RIGHT sixth and seventh ribs. IMPRESSION: Enlargement of cardiac silhouette. Patchy RIGHT mid lung and LEFT basilar infiltrate versus atelectasis. Electronically Signed   By: Lavonia Dana M.D.   On: 01/17/2019 16:43   Ct Abdomen Pelvis W  Contrast  Result Date: 01/17/2019 CLINICAL DATA:  Generalized abdominal pain EXAM: CT ABDOMEN AND PELVIS WITH CONTRAST TECHNIQUE: Multidetector CT imaging of the abdomen and pelvis was performed using the standard protocol following bolus administration of intravenous contrast. CONTRAST:  168mL OMNIPAQUE IOHEXOL 300 MG/ML  SOLN COMPARISON:  CT 06/24/2017, 09/19/2016 FINDINGS: Lower chest: Lung bases demonstrate partially visualized cardiac pacing leads. No acute consolidation or effusion. Mild scarring at the right base. Similar aneurysmal dilatation of distal descending thoracic aorta measuring up to 4 cm. Hepatobiliary: Pneumobilia. Dilated bile ducts containing air and fluid, similar as compared with most recent prior CT. Pancreas: Prominent pancreatic duct as before. No inflammatory change Spleen: Normal in size without focal abnormality. Adrenals/Urinary Tract: Stable 2 cm left adrenal gland nodule. Right adrenal gland is normal. Kidneys are normal, without renal calculi, focal lesion, or hydronephrosis. Bladder is unremarkable. Stomach/Bowel: Stomach is within normal limits. Status post appendectomy. Large volume of stool in the colon. Sigmoid colon diverticula without acute inflammatory change no evidence of bowel wall thickening, distention, or inflammatory changes. Vascular/Lymphatic: Aneurysmal dilatation of the proximal infrarenal abdominal aorta up to 3.9 cm in change. Moderate aortic atherosclerosis. No significantly enlarged lymph nodes Reproductive: Uterus and bilateral adnexa are unremarkable.  Other: Negative for free air or free fluid. Musculoskeletal: Degenerative changes without acute or suspicious abnormality. IMPRESSION: 1. No CT evidence for acute intra-abdominal or pelvic abnormality. 2. Stable pneumobilia and biliary dilatation, likely due to prior intervention 3. Stable 2 cm left adrenal nodule consistent with an adenoma 4. 3.9 cm abdominal aortic aneurysm. Recommend followup by ultrasound  in 2 years. This recommendation follows ACR consensus guidelines: White Paper of the ACR Incidental Findings Committee II on Vascular Findings. J Am Coll Radiol 2013; 10:789-794. Aortic aneurysm NOS (ICD10-I71.9) 5. Large volume of stool in the colon, query constipation Electronically Signed   By: Donavan Foil M.D.   On: 01/17/2019 18:47    Procedures Procedures (including critical care time)  Medications Ordered in ED Medications  ondansetron (ZOFRAN) injection 4 mg (4 mg Intravenous Given 01/17/19 1638)  sodium chloride 0.9 % bolus 1,000 mL (0 mLs Intravenous Stopped 01/17/19 1734)  iohexol (OMNIPAQUE) 300 MG/ML solution 100 mL (100 mLs Intravenous Contrast Given 01/17/19 1756)     Initial Impression / Assessment and Plan / ED Course  I have reviewed the triage vital signs and the nursing notes.  Pertinent labs & imaging results that were available during my care of the patient were reviewed by me and considered in my medical decision making (see chart for details).  84 year old female presents with ongoing dysuria and suprapubic pain. She also feels dehydrated and lightheaded. BP is somewhat soft here but otherwise vitals are normal. Will order labs, UA, CXR due to her recent rib fractures and hx of pneumonia. WIll also order CT abdomen although her abdominal exam is benign.  CBC is normal. CMP is remarkable for hyponatremia (127). Pt states this is chronic. She has a very mild AKI - SCr is 1.11 today up from .79 on Jan 21. CXR show possible pneumonia. She was treated for pneumonia in January. Imaging today actually looks improved from prior so unclear if this represents a new or resolving infection. UA looks clean. CT abdomen is pending.  CT does not show any evidence for acute intra-abdominal or pelvic abnormality. She does have some constipation. Discussed results with patient. She doesn't want to wait for MD's evaluation. She feels better after fluids and has been ambulating with nursing  to the bathroom. Encouraged her to increased her fluid intake at home. Will rx Pyridium for possible bladder spasms. She will follow up with her doctor.  Final Clinical Impressions(s) / ED Diagnoses   Final diagnoses:  Lower abdominal pain  Dysuria  Chronic hyponatremia    ED Discharge Orders    None       Recardo Evangelist, PA-C 01/17/19 1956    Drenda Freeze, MD 01/17/19 [phone]

## 2019-01-17 NOTE — ED Notes (Signed)
Pt ambulated to bathroom, minimum assist. NAD noted.

## 2019-01-17 NOTE — Patient Outreach (Signed)
[facility]) Care Management  01/17/2019  Laurie [name] 05-23-1935 [id number]   Subjective: Telephone call to patient's home  / mobile number, no answer, left HIPAA compliant voicemail message, and requested call back.     Objective:Per KPN (Knowledge Performance Now, point of care tool) and chart review,patient hospitalized 12/17/2018 -12/20/2018 forRight sixth and seventh rib fractures after mechanical fall at home. Discharge from hospital to [facility], discharged from rehab on 12/29/2018. Patient also has a history of COPD, chronic diastolic congestive heart failure, hypertension, hyperlipidemia, and atrial fibrillation.      Assessment: Received [employer] Transition of care follow up referral on 01/03/2019.Transition of care follow up not completed due to unable to contact patient and will proceed with case closure.      Plan:Case closure due to unable to reach.       Veto Macqueen H. Annia Friendly, BSN, [place] Management [facility] Telephonic CM Phone: [phone] Fax: [phone]

## 2019-01-17 NOTE — ED Provider Notes (Signed)
[name]    CSN: [id number] Arrival date & time: 01/17/19  1310     History   Chief Complaint Chief Complaint  Patient presents with  . Abdominal Pain  . Weakness    HPI Laurie [name] is a 84 y.o. female.   HPI  Laurie [name] is a 84 y.o. female presenting to UC with c/o gradually worsening abdominal pain, nausea, generalized weakness since being dx with a UTI on Thursday 01/12/2019. Today she has experienced severe chills, weakness, and abdominal pain. She reports drinking 4 large glasses of water last night but has only been able to urinate a very small amount.  She was started on Keflex  01/12/2019 but was changed to Bactrim on Sunday 01/15/2019 after the urine culture returned.  She was seen by her PCP on Friday 01/13/2019 for a routine follow up after she was seen in the emergency department last month for a fall, resulting in several rib fractures on the Right side from a fall on 12/17/2018. She reports pain with breathing but has used her incentive spirometer on occasion. Denies feeling SOB.  Hx of COPD but she has not used her albuterol inhaler. Denies fever, vomiting or diarrhea.    Past Medical History:  Diagnosis Date  . AAA (abdominal aortic aneurysm) ([place])   . Anemia    iron deficiency  . Ascending aorta dilatation (HCC)   . Atrial fibrillation ([place])    post op  . CAD (coronary artery disease)   . Carcinoma of lung ([place]) 08/28/2008   T2N0 moderately diff. squamous cell CA  . Cerebrovascular disease   . COPD (chronic obstructive pulmonary disease) ([place]) 2005   on CXR with R apical scaring  . Headache(784.0)    Hx: of migraines in the past  . Hypercholesterolemia    Hx: of  . Hypertension   . Incidental pulmonary nodule, > 68mm and < 21mm    LLL  . S/P aortic valve replacement 08/28/2008   [facility]  . S/P lobectomy of lung 08/28/2008   rt middle lobe  dr. Roxy Manns  . Shingles 11-09    Patient Active Problem List   Diagnosis Date Noted  .  Rib fracture 12/19/2018  . Fall 12/17/2018  . Tachy-brady syndrome ([place]) 12/05/2018  . Low grade squamous intraepithelial lesion (LGSIL) on cervicovaginal cytologic smear 04/08/2018  . Paroxysmal atrial fibrillation ([place]) 02/25/2018  . Sinus bradycardia 02/25/2018  . Incidental pulmonary nodule, > 32mm and < 27mm   . Claudication ([place]) 04/22/2016  . Dyspnea   . Atrial fibrillation with RVR ([place])   . COPD (chronic obstructive pulmonary disease) ([place])   . RUQ abdominal pain   . Biliary calculus   . Pulmonary emphysema ([place])   . Aortic valve disease   . Thoracic aortic aneurysm without rupture ([place]) 08/19/2015  . Ascending aorta dilatation (H[facility])   . Abdominal aortic aneurysm ([place]) 08/26/2011  . HYPERGLYCEMIA, MILD 11/10/2010  . OSTEOPENIA 09/29/2010  . Coronary atherosclerosis 05/15/2009  . Bilateral carotid artery disease ([place]) 05/15/2009  . TIA 05/15/2009  . ILIAC ARTERY ANEURYSM 05/15/2009  . GAD (generalized anxiety disorder) 03/20/2009  . THYROID NODULE 01/09/2009  . THYROMEGALY 01/08/2009  . SIALOLITHIASIS 01/08/2009  . MALIGNANT NEOPLASM MIDDLE LOBE BRONCHUS OR LUNG 10/24/2008  . HLD (hyperlipidemia) 10/24/2008  . AORTIC VALVE REPLACEMENT, HX OF 10/24/2008  . S/P aortic valve replacement 08/28/2008  . DIVERTICULOSIS OF COLON 06/19/2008  . ANEMIA, IRON DEFICIENCY 06/06/2008  . Essential hypertension, benign 06/06/2008  .  GASTROINTESTINAL HEMORRHAGE, HX OF 06/06/2008    Past Surgical History:  Procedure Laterality Date  . ABDOMINAL SURGERY    . AORTIC VALVE REPLACEMENT  08/28/2008   #56mm [facility] Ease pericardial tissue valve  . APPENDECTOMY    . CARDIAC CATHETERIZATION    . CATARACT EXTRACTION W/ INTRAOCULAR LENS  IMPLANT, BILATERAL Bilateral   . CHOLECYSTECTOMY N/A 12/13/2013   Procedure: LAPAROSCOPIC CHOLECYSTECTOMY;  Surgeon: Ralene Ok, MD;  Location: [facility];  Service: General;  Laterality: N/A;  . COLONOSCOPY     \  . DILATION AND CURETTAGE OF UTERUS     . ERCP N/A 05/08/2015   Procedure: ENDOSCOPIC RETROGRADE CHOLANGIOPANCREATOGRAPHY (ERCP);  Surgeon: Inda Castle, MD;  Location: [facility];  Service: Endoscopy;  Laterality: N/A;  . ERCP N/A 01/13/2016   Procedure: ENDOSCOPIC RETROGRADE CHOLANGIOPANCREATOGRAPHY (ERCP);  Surgeon: Doran Stabler, MD;  Location[facility];  Service: Endoscopy;  Laterality: N/A;  . ERCP N/A 03/09/2016   Procedure: ENDOSCOPIC RETROGRADE CHOLANGIOPANCREATOGRAPHY (ERCP);  Surgeon: Ladene Artist, MD;  Location: [facility];  Service: Endoscopy;  Laterality: N/A;  . PACEMAKER IMPLANT N/A 12/05/2018   Procedure: PACEMAKER IMPLANT;  Surgeon: Evans Lance, MD;  Location: [facility];  Service: Cardiovascular;  Laterality: N/A;  . RML removed Dr Ricard Dillon for lung cancer  08/28/2008   T2N0 squamous cell CA  . TONSILLECTOMY AND ADENOIDECTOMY    . u/s guided aspiration of R breast cyst   2009   at the breast clinic    OB History    Gravida  2   Para  2   Term      Preterm      AB      Living        SAB      TAB      Ectopic      Multiple      Live Births  2            Home Medications    Prior to Admission medications   Medication Sig Start Date End Date Taking? Authorizing Provider  albuterol (PROVENTIL) (2.5 MG/3ML) 0.083% nebulizer solution Take 3 mLs (2.5 mg total) by nebulization every 4 (four) hours as needed for wheezing or shortness of breath. 12/01/18   Noe Gens, PA-C  ALPRAZolam Duanne Moron) 0.25 MG tablet TAKE 1-2 TABLETS (0.25MG  TO 0.5MG  TOTAL) BY MOUTH TWICE DAILY AS NEEDED 12/20/18   Dessa Phi, DO  amiodarone (PACERONE) 200 MG tablet TAKE 1 TABLET BY MOUTH EVERY DAY Patient taking differently: Take 200 mg by mouth daily before breakfast.  06/07/18   Lelon Perla, MD  amLODipine (NORVASC) 5 MG tablet Take 1 tablet (5 mg total) by mouth daily. Patient taking differently: Take 5 mg by mouth daily after supper.  05/10/18   Lelon Perla, MD  b complex  vitamins tablet Take 1 tablet by mouth daily with supper.    [provider]  cephALEXin (KEFLEX) 500 MG capsule Take 1 capsule (500 mg total) by mouth 4 (four) times daily for 10 days. 01/12/19 01/22/19  Fransico Meadow, PA-C  diltiazem (CARDIZEM CD) 180 MG 24 hr capsule Take 1 capsule (180 mg total) by mouth daily for 30 days. 12/12/18 01/13/19  Evans Lance, MD  Docusate Sodium (STOOL SOFTENER LAXATIVE PO) Take 1 capsule by mouth at bedtime as needed (constipation).     [provider]  furosemide (LASIX) 20 MG tablet Take 1 tablet (20 mg total)  by mouth daily. 06/30/18 02/03/19  Lelon Perla, MD  lisinopril (PRINIVIL,ZESTRIL) 40 MG tablet TAKE 1 TABLET BY MOUTH EVERY DAY Patient taking differently: Take 40 mg by mouth daily before breakfast.  11/07/18   Stanford Breed, Denice Bors, MD  oxyCODONE (OXY IR/ROXICODONE) 5 MG immediate release tablet Take 1 tablet (5 mg total) by mouth every 6 (six) hours as needed for up to 5 days for severe pain. 01/13/19 01/18/19  Hali Marry, MD  polyethylene glycol powder (GLYCOLAX/MIRALAX) powder Take 17 g by mouth daily. Patient taking differently: Take 17 g by mouth daily as needed (constipation).  08/11/18   Danis, Kirke Corin, MD  XARELTO 20 MG TABS tablet TAKE 1 TABLET (20 MG TOTAL) BY MOUTH DAILY WITH SUPPER. Patient taking differently: Take 20 mg by mouth daily after supper. High risk med: Anticoagulant.  Crushed Xarelto can be given down a G-tube but NOT a J-Tube. 11/17/18   Lelon Perla, MD    Family History Family History  Problem Relation Age of Onset  . Stroke Father   . Diabetes Son        brittle diabetes  . Colon polyps Neg Hx   . Colon cancer Neg Hx   . Esophageal cancer Neg Hx   . Stomach cancer Neg Hx   . Pancreatic cancer Neg Hx   . Kidney disease Neg Hx   . Liver disease Neg Hx     Social History Social History   Tobacco Use  . Smoking status: Former Smoker    Years: 40.00  . Smokeless tobacco: Never Used    Substance Use Topics  . Alcohol use: No    Alcohol/week: 0.0 standard drinks  . Drug use: No     Allergies   Hyoscyamine; Rofecoxib; Statins; and Metoprolol   Review of Systems Review of Systems  Constitutional: Positive for chills and fatigue. Negative for fever.  HENT: Negative for congestion, ear pain, sore throat, trouble swallowing and voice change.   Respiratory: Negative for cough and shortness of breath.   Cardiovascular: Negative for chest pain and palpitations.  Gastrointestinal: Positive for abdominal pain and nausea. Negative for diarrhea and vomiting.  Genitourinary: Positive for decreased urine volume and dysuria.  Musculoskeletal: Negative for arthralgias, back pain and myalgias.  Skin: Negative for rash.  Neurological: Positive for weakness. Negative for dizziness and headaches.     Physical Exam Triage Vital Signs ED Triage Vitals  Enc Vitals Group     BP 01/17/19 1327 114/69     Pulse Rate 01/17/19 1327 75     Resp 01/17/19 1327 20     Temp 01/17/19 1327 97.7 F (36.5 C)     Temp Source 01/17/19 1327 Oral     SpO2 01/17/19 1327 93 %     Weight --      Height 01/17/19 1328 5\' 6"  (1.676 m)     Head Circumference --      Peak Flow --      Pain Score --      Pain Loc --      Pain Edu? --      Excl. in [place]? --    No data found.  Updated Vital Signs BP 114/69 (BP Location: Left Arm)   Pulse 75   Temp 97.7 F (36.5 C) (Oral)   Resp 20   Ht 5\' 6"  (1.676 m)   SpO2 93%   BMI 27.12 kg/m   Visual Acuity Right Eye Distance:   Left Eye  Distance:   Bilateral Distance:    Right Eye Near:   Left Eye Near:    Bilateral Near:     Physical Exam Vitals signs and nursing note reviewed.  Constitutional:      Appearance: She is well-developed.  HENT:     Head: Normocephalic and atraumatic.  Neck:     Musculoskeletal: Normal range of motion.  Cardiovascular:     Rate and Rhythm: Normal rate and regular rhythm.  Pulmonary:     Effort: Pulmonary  effort is normal.     Breath sounds: Wheezing (lower lung bases) and rhonchi present.  Abdominal:     Palpations: Abdomen is soft.     Tenderness: There is generalized abdominal tenderness. There is no right CVA tenderness or left CVA tenderness.  Musculoskeletal: Normal range of motion.  Skin:    General: Skin is warm and dry.  Neurological:     Mental Status: She is alert and oriented to person, place, and time.  Psychiatric:        Behavior: Behavior normal.      UC Treatments / Results  Labs (all labs ordered are listed, but only abnormal results are displayed) Labs Reviewed - No data to display  EKG None  Radiology No results found.  Procedures Procedures (including critical care time)  Medications Ordered in UC Medications - No data to display  Initial Impression / Assessment and Plan / UC Course  I have reviewed the triage vital signs and the nursing notes.  Pertinent labs & imaging results that were available during my care of the patient were reviewed by me and considered in my medical decision making (see chart for details).     Pt concerned she is developing sepsis after dx with UTI on 01/12/2019. Gradually worsening abdominal pain with onset of severe chills and nausea this morning despite starting bactrim 2 days ago.  Vitals: unremarkable, pt does not appear septic at this time. Offered starting workup with CXR, CBC and urine in UC  Pt requesting to take ambulance to emergency department due to feeling "worse today" than when she had sepsis in 2017.    EMS came, will transport pt to [facility].  Final Clinical Impressions(s) / UC Diagnoses   Final diagnoses:  Weakness  History of UTI  History of sepsis  Abdominal pain, generalized  Wheeze  Closed fracture of multiple ribs of right side with routine healing, subsequent encounter   Discharge Instructions   None    ED Prescriptions    None     Controlled Substance Prescriptions   Controlled Substance Registry consulted? Not Applicable   Tyrell Antonio 01/17/19 1358

## 2019-01-17 NOTE — Discharge Instructions (Addendum)
Take Pyridium for bladder spasms and painful urination You can try a stool softener for constipation Please drink plenty of fluids  Return if you are worsening

## 2019-01-17 NOTE — ED Notes (Signed)
Bladder scan: 142

## 2019-01-17 NOTE — ED Triage Notes (Signed)
Pt was seen here on Thursday, with UTI, Medication change on Sunday.  Took medication on Sunday night, twice yesterday, and once today.  Has been having nausea all morning, lower abdominal pain, and feels like when she urinates she can not empty her bladder.  Pt is also visibly shaking.

## 2019-01-17 NOTE — ED Notes (Signed)
ED Provider at bedside. 

## 2019-01-17 NOTE — ED Notes (Signed)
Patient transported to CT 

## 2019-01-17 NOTE — ED Notes (Signed)
purewick applied.

## 2019-01-18 ENCOUNTER — Telehealth: Payer: Self-pay | Admitting: *Deleted

## 2019-01-18 ENCOUNTER — Telehealth: Payer: Self-pay | Admitting: Cardiology

## 2019-01-18 NOTE — Telephone Encounter (Signed)
Spoke with pt, she fell and has some cracked ribs and she has had a UTI that she is getting over. She complains of a stomach ache and feels like she has the flu. She reports this is the way she felt on the metoprolol. She fells it maybe the diltiazem or amiodarone. Explained to the patient the reason for those 2 medications and tried to make her understand the risk of having atrial fib again if some of the medications were stopped. She feels the medications are causing her problerms. Will forward for dr Stanford Breed review

## 2019-01-18 NOTE — Telephone Encounter (Signed)
New Message   Pt c/o medication issue:  1. Name of Medication: Amiodarone, Diltiazem  2. How are you currently taking this medication (dosage and times per day)? 200mg , 180mg   3. Are you having a reaction (difficulty breathing--STAT)? No   4. What is your medication issue? PT went to the ER yesterday and PT stated they couldn't find anything wrong with her. She thinks it is one of the medications that she is taking that is making her feel bad. She said she feels worse today then she did yesterday.  PT states she feels weak, hardly can get around, she said she feels like she had the Flu but she doesn't have the Flu.

## 2019-01-18 NOTE — Telephone Encounter (Signed)
Continue present meds for now as symptoms likely unrelated Laurie [name]

## 2019-01-18 NOTE — Telephone Encounter (Signed)
Callback: No answer, left message following up from visit, and ED. Call back as needed.

## 2019-01-19 ENCOUNTER — Encounter: Payer: Self-pay | Admitting: Family Medicine

## 2019-01-19 ENCOUNTER — Ambulatory Visit (INDEPENDENT_AMBULATORY_CARE_PROVIDER_SITE_OTHER): Payer: Medicare HMO

## 2019-01-19 ENCOUNTER — Ambulatory Visit (INDEPENDENT_AMBULATORY_CARE_PROVIDER_SITE_OTHER): Payer: Medicare HMO | Admitting: Family Medicine

## 2019-01-19 VITALS — BP 135/57 | HR 59 | Temp 97.8°F | Ht 66.0 in | Wt 168.0 lb

## 2019-01-19 DIAGNOSIS — R102 Pelvic and perineal pain: Secondary | ICD-10-CM

## 2019-01-19 DIAGNOSIS — I480 Paroxysmal atrial fibrillation: Secondary | ICD-10-CM | POA: Diagnosis not present

## 2019-01-19 DIAGNOSIS — R0902 Hypoxemia: Secondary | ICD-10-CM

## 2019-01-19 DIAGNOSIS — Z950 Presence of cardiac pacemaker: Secondary | ICD-10-CM | POA: Diagnosis not present

## 2019-01-19 DIAGNOSIS — R0602 Shortness of breath: Secondary | ICD-10-CM

## 2019-01-19 LAB — POCT URINALYSIS DIPSTICK
Bilirubin, UA: NEGATIVE
Blood, UA: NEGATIVE
Glucose, UA: NEGATIVE
Ketones, UA: NEGATIVE
Leukocytes, UA: NEGATIVE
Nitrite, UA: NEGATIVE
Protein, UA: NEGATIVE
Spec Grav, UA: 1.01 (ref 1.010–1.025)
Urobilinogen, UA: 0.2 E.U./dL
pH, UA: 6.5 (ref 5.0–8.0)

## 2019-01-19 MED ORDER — AMBULATORY NON FORMULARY MEDICATION: 0 refills | Status: DC

## 2019-01-19 MED ORDER — AZITHROMYCIN 250 MG PO TABS: ORAL_TABLET | ORAL | 0 refills | Status: AC

## 2019-01-19 NOTE — Progress Notes (Signed)
Acute Office Visit  Subjective:    Patient ID: Laurie [name], female    DOB: Apr 30, 1935, 84 y.o.   MRN: [id number]  Chief Complaint  Patient presents with  . low O2    pt reports that around 830-900 this morning she checked her O2 and it was 84% she decided to do a nebulizer treatment thinking that this would help to bring her oxygen up and she said her O2 levels were     HPI Patient is in today for pt reports that around 8:30-9:00 this morning she checked her O2 and it was 84% she decided to do a nebulizer treatment thinking that this would help to bring her oxygen up and she said her O2 levels were.  She actually tried to get up and walk up and down her hall and her oxygen level actually went up but when she sat down again it went back down.  She denies any significant chest discomfort.  And she really only feels minimally short of breath.  No chest pain.  She denies any significant cough.  She says maybe once or twice in the middle the night she will feel like there is a little chest congestion she can cough a few times it feels like it is clears and then she feels fine.  She denies any fevers chills or sweats.  She really wonders if this could be a medication side effect.  Just the fatigue and shortness of breath she feels like it is consistent with when she was taking metoprolol previously. She wonders if could be one of her medicine.  She actually says that she called her cardiologist to make sure that she was not taking something that was making her "sick".  She does have a fibrillation but has been taking her Xarelto regularly.  She was also just seen in the emergency department 2 days ago for lower abdominal pain and dysuria.  She got a little dehydrated and lightheaded.  Vitals overall were normal.  They did labs urinalysis and chest x-ray.  They still did see a persistent infiltrate on chest x-ray but felt like it actually look better compared to previous that she actually was  diagnosed with pneumonia in January.  Her CBC was normal but her sodium level was low at 127.  That she does have some chronic hyponatremia.  To had abdominal pelvic CT which was negative for any acute processes.  Better after some IV hydration.  Discharged home.  She says she has been taking an antibiotic for urinary tract infection.  Had Keflex on her medication list but she says the one she is taking is 3 times a day and it is only for 5 days.  He says the dysuria and lower abdominal pain actually started while she was in rehab couple of weeks ago.  In fact while there they had to put an adult diaper on her because she was having episodes of incontinence because she could not make it to the bathroom in time.  She says while there she actually told them that her urine had a really bad smell which was new she said they did do a sample but never heard anything back about it.  More recently at our urgent care she did a urine culture which was positive positive for Enterobacter.  Past Medical History:  Diagnosis Date  . AAA (abdominal aortic aneurysm) ([place])   . Anemia    iron deficiency  . Ascending aorta dilatation ([facility])   .  Atrial fibrillation ([place])    post op  . CAD (coronary artery disease)   . Carcinoma of lung ([place]) 08/28/2008   T2N0 moderately diff. squamous cell CA  . Cerebrovascular disease   . COPD (chronic obstructive pulmonary disease) ([place]) 2005   on CXR with R apical scaring  . Headache(784.0)    Hx: of migraines in the past  . Hypercholesterolemia    Hx: of  . Hypertension   . Incidental pulmonary nodule, > 68mm and < 8mm    LLL  . S/P aortic valve replacement 08/28/2008   [facility]  . S/P lobectomy of lung 08/28/2008   rt middle lobe  dr. Roxy Manns  . Shingles 11-09    Past Surgical History:  Procedure Laterality Date  . ABDOMINAL SURGERY    . AORTIC VALVE REPLACEMENT  08/28/2008   #78mm [facility] Ease pericardial tissue valve  . APPENDECTOMY    . CARDIAC CATHETERIZATION     . CATARACT EXTRACTION W/ INTRAOCULAR LENS  IMPLANT, BILATERAL Bilateral   . CHOLECYSTECTOMY N/A 12/13/2013   Procedure: LAPAROSCOPIC CHOLECYSTECTOMY;  Surgeon: Ralene Ok, MD;  Location: [facility];  Service: General;  Laterality: N/A;  . COLONOSCOPY     \  . DILATION AND CURETTAGE OF UTERUS    . ERCP N/A 05/08/2015   Procedure: ENDOSCOPIC RETROGRADE CHOLANGIOPANCREATOGRAPHY (ERCP);  Surgeon: Inda Castle, MD;  Location: [facility];  Service: Endoscopy;  Laterality: N/A;  . ERCP N/A 01/13/2016   Procedure: ENDOSCOPIC RETROGRADE CHOLANGIOPANCREATOGRAPHY (ERCP);  Surgeon: Doran Stabler, MD;  Location: [facility];  Service: Endoscopy;  Laterality: N/A;  . ERCP N/A 03/09/2016   Procedure: ENDOSCOPIC RETROGRADE CHOLANGIOPANCREATOGRAPHY (ERCP);  Surgeon: Ladene Artist, MD;  Location: [facility];  Service: Endoscopy;  Laterality: N/A;  . PACEMAKER IMPLANT N/A 12/05/2018   Procedure: PACEMAKER IMPLANT;  Surgeon: Evans Lance, MD;  Location: [place] [facility];  Service: Cardiovascular;  Laterality: N/A;  . RML removed Dr Ricard Dillon for lung cancer  08/28/2008   T2N0 squamous cell CA  . TONSILLECTOMY AND ADENOIDECTOMY    . u/s guided aspiration of R breast cyst   2009   at the breast clinic    Family History  Problem Relation Age of Onset  . Stroke Father   . Diabetes Son        brittle diabetes  . Colon polyps Neg Hx   . Colon cancer Neg Hx   . Esophageal cancer Neg Hx   . Stomach cancer Neg Hx   . Pancreatic cancer Neg Hx   . Kidney disease Neg Hx   . Liver disease Neg Hx     Social History   Socioeconomic History  . Marital status: Single    Spouse name: Not on file  . Number of children: Not on file  . Years of education: Not on file  . Highest education level: Not on file  Occupational History  . Not on file  Social Needs  . Financial resource strain: Not on file  . Food insecurity:    Worry: Not on file    Inability: Not on file  . Transportation needs:     Medical: Not on file    Non-medical: Not on file  Tobacco Use  . Smoking status: Former Smoker    Years: 40.00  . Smokeless tobacco: Never Used  Substance and Sexual Activity  . Alcohol use: No    Alcohol/week: 0.0 standard drinks  . Drug use: No  . Sexual activity: Not  on file  Lifestyle  . Physical activity:    Days per week: Not on file    Minutes per session: Not on file  . Stress: Not on file  Relationships  . Social connections:    Talks on phone: Not on file    Gets together: Not on file    Attends religious service: Not on file    Active member of club or organization: Not on file    Attends meetings of clubs or organizations: Not on file    Relationship status: Not on file  . Intimate partner violence:    Fear of current or ex partner: Not on file    Emotionally abused: Not on file    Physically abused: Not on file    Forced sexual activity: Not on file  Other Topics Concern  . Not on file  Social History Narrative  . Not on file    Outpatient Medications Prior to Visit  Medication Sig Dispense Refill  . albuterol (PROVENTIL) (2.5 MG/3ML) 0.083% nebulizer solution Take 3 mLs (2.5 mg total) by nebulization every 4 (four) hours as needed for wheezing or shortness of breath. 30 vial 0  . ALPRAZolam (XANAX) 0.25 MG tablet TAKE 1-2 TABLETS (0.25MG  TO 0.5MG  TOTAL) BY MOUTH TWICE DAILY AS NEEDED 12 tablet 0  . amiodarone (PACERONE) 200 MG tablet TAKE 1 TABLET BY MOUTH EVERY DAY (Patient taking differently: Take 200 mg by mouth daily before breakfast. ) 90 tablet 1  . amLODipine (NORVASC) 5 MG tablet Take 1 tablet (5 mg total) by mouth daily. (Patient taking differently: Take 5 mg by mouth daily after supper. ) 90 tablet 3  . b complex vitamins tablet Take 1 tablet by mouth daily with supper.    . diltiazem (CARDIZEM CD) 180 MG 24 hr capsule Take 1 capsule (180 mg total) by mouth daily for 30 days. 90 capsule 3  . Docusate Sodium (STOOL SOFTENER LAXATIVE PO) Take 1 capsule  by mouth at bedtime as needed (constipation).     . furosemide (LASIX) 20 MG tablet Take 1 tablet (20 mg total) by mouth daily. 90 tablet 3  . lisinopril (PRINIVIL,ZESTRIL) 40 MG tablet TAKE 1 TABLET BY MOUTH EVERY DAY (Patient taking differently: Take 40 mg by mouth daily before breakfast. ) 90 tablet 3  . phenazopyridine (PYRIDIUM) 200 MG tablet Take 1 tablet (200 mg total) by mouth 3 (three) times daily. 6 tablet 0  . polyethylene glycol powder (GLYCOLAX/MIRALAX) powder Take 17 g by mouth daily. (Patient taking differently: Take 17 g by mouth daily as needed (constipation). ) 250 g 2  . XARELTO 20 MG TABS tablet TAKE 1 TABLET (20 MG TOTAL) BY MOUTH DAILY WITH SUPPER. (Patient taking differently: Take 20 mg by mouth daily after supper. High risk med: Anticoagulant.  Crushed Xarelto can be given down a G-tube but NOT a J-Tube.) 90 tablet 1  . cephALEXin (KEFLEX) 500 MG capsule Take 1 capsule (500 mg total) by mouth 4 (four) times daily for 10 days. 28 capsule 0   No facility-administered medications prior to visit.     Allergies  Allergen Reactions  . Hyoscyamine Other (See Comments)    Mental status changes.  (as of 02/13/16 patient denies any problems with this medication)  . Rofecoxib Anaphylaxis and Hives  . Statins Other (See Comments)    Muscle aches  . Metoprolol Other (See Comments)    Extreme fatigue, weakness and brady    ROS     Objective:  Physical Exam  Constitutional: She is oriented to person, place, and time. She appears well-developed and well-nourished.  HENT:  Head: Normocephalic and atraumatic.  Right Ear: External ear normal.  Left Ear: External ear normal.  Nose: Nose normal.  Mouth/Throat: Oropharynx is clear and moist.  TMs and canals are clear.   Eyes: Pupils are equal, round, and reactive to light. Conjunctivae and EOM are normal.  Neck: Neck supple. No thyromegaly present.  Cardiovascular: Normal rate, regular rhythm and normal heart sounds.  No  murmur heard. Pulmonary/Chest: Effort normal and breath sounds normal. She has no wheezes.  No increased work of breathing.    Lymphadenopathy:    She has no cervical adenopathy.  Neurological: She is alert and oriented to person, place, and time.  Skin: Skin is warm and dry.  Psychiatric: She has a normal mood and affect.    BP (!) 135/57   Pulse (!) 59   Temp 97.8 F (36.6 C) (Oral)   Ht 5\' 6"  (1.676 m)   Wt 168 lb (76.2 kg)   SpO2 (!) 88%   BMI 27.12 kg/m  Wt Readings from Last 3 Encounters:  01/19/19 168 lb (76.2 kg)  01/17/19 168 lb (76.2 kg)  01/13/19 168 lb (76.2 kg)    Health Maintenance Due  Topic Date Due  . PNA vac Low Risk Adult (2 of 2 - PCV13) 07/31/2009    There are no preventive care reminders to display for this patient.   Lab Results  Component Value Date   TSH 1.64 01/13/2018   Lab Results  Component Value Date   WBC 7.1 01/17/2019   HGB 13.8 01/17/2019   HCT 42.6 01/17/2019   MCV 87.8 01/17/2019   PLT 240 01/17/2019   Lab Results  Component Value Date   NA 127 (L) 01/17/2019   K 5.1 01/17/2019   CO2 25 01/17/2019   GLUCOSE 105 (H) 01/17/2019   BUN 9 01/17/2019   CREATININE 1.11 (H) 01/17/2019   BILITOT 1.0 01/17/2019   ALKPHOS 92 01/17/2019   AST 28 01/17/2019   ALT 24 01/17/2019   PROT 6.7 01/17/2019   ALBUMIN 3.8 01/17/2019   CALCIUM 9.4 01/17/2019   ANIONGAP 11 01/17/2019   GFR 87.46 04/25/2015   Lab Results  Component Value Date   CHOL 291 (H) 09/18/2010   Lab Results  Component Value Date   HDL 38 (L) 09/18/2010   Lab Results  Component Value Date   LDLCALC 191 (H) 09/18/2010   Lab Results  Component Value Date   TRIG 308 (H) 09/18/2010   Lab Results  Component Value Date   CHOLHDL 7.7 Ratio 09/18/2010   Lab Results  Component Value Date   HGBA1C 6.2 (H) 04/18/2015       Assessment & Plan:   Problem List Items Addressed This Visit    None    Visit Diagnoses    Hypoxemia    -  Primary   Relevant  Medications   AMBULATORY NON FORMULARY MEDICATION   Other Relevant Orders   DG Chest 2 View   SOB (shortness of breath)       Relevant Medications   AMBULATORY NON FORMULARY MEDICATION   Other Relevant Orders   DG Chest 2 View   Pelvic pain         Hypoxemia with just some mild shortness of breath.  Even though know she had a chest x-ray done about 2 days ago I want a repeat that to see if  there is any worsening of possible infiltrate she had had They felt like the chest x-ray had actually improved just was not completely cleared and I wanted make sure she does not have secondary onset of pneumonia again or possibly pulmonary edema.  She is not running a fever.  And denies any significant cough etc.  She denies any chest pain and she is taking her Xarelto regularly so pulmonary embolism is less likely.  Does have abdominal aortic aneurysm but that is being monitored and again no chest pain.  Pelvic pain-finishing up antibiotics for UTI.  Will repeat urinalysis today go ahead and finish what she has for now. She still is having some burning so will recheck UA today.  CT was essentially negative and reassuring.  Meds ordered this encounter  Medications  . azithromycin (ZITHROMAX) 250 MG tablet    Sig: 2 Ttabs PO on Day 1, then one a day x 4 days.    Dispense:  6 tablet    Refill:  0  . AMBULATORY NON FORMULARY MEDICATION    Sig: Medication Name: Needs oxygen 2 liter deliver for hypoxemia.    Dispense:  1 vial    Refill:  0     Beatrice Lecher, MD

## 2019-01-19 NOTE — Telephone Encounter (Signed)
Left detailed message for the patient of dr Jacalyn Lefevre recommendations. She is to call to schedule follow up appointment.

## 2019-01-19 NOTE — Addendum Note (Signed)
Addended by: Teddy Spike on: 01/19/2019 04:16 PM   Modules accepted: Orders

## 2019-01-20 ENCOUNTER — Other Ambulatory Visit: Payer: Self-pay | Admitting: Family Medicine

## 2019-01-20 MED ORDER — PREDNISONE 20 MG PO TABS: 40.0000 mg | ORAL_TABLET | Freq: Every day | ORAL | 0 refills | Status: DC

## 2019-01-23 ENCOUNTER — Ambulatory Visit: Payer: Medicare HMO | Admitting: Family Medicine

## 2019-01-23 DIAGNOSIS — J441 Chronic obstructive pulmonary disease with (acute) exacerbation: Secondary | ICD-10-CM | POA: Diagnosis not present

## 2019-01-25 ENCOUNTER — Ambulatory Visit: Payer: Medicare HMO | Admitting: Cardiology

## 2019-01-25 NOTE — Progress Notes (Deleted)
HPI: FU atrial fibrillation, CAD, ho aortic stenosis status post aortic valve replacement in September 2009. She had a pericardial tissue valve; also had a septal myectomy and a right middle lobectomy due to lung CA. Note, she had a 50% mid LAD by catheterization preoperatively. Echocardiogram April 2017 showed normal LV function, grade 2 diastolic dysfunction, prior aortic valve replacement with mean gradient 6 mmHg. Carotid Dopplers November 2017 showed mild bilateral atherosclerosis and no hemodynamically significant stenosis. Dopplers 5/18: ABI of 0.57 on the right and 0.76 on the left. Waveforms were suggestive of inflow disease. Duplex showed significant distal aortic and bilateral common iliac artery stenosis with moderate left SFA stenosis.Seen by Dr Fletcher Anon and medical therapy recommended for now.Amiodarone added at previous ov for recurrent atrial fibrillation. Patient was admitted January 2020 with recurrent atrial fibrillation and was noted to have posttermination pauses.  Pacemaker was placed.  Chest CT January 2020 showed 4.6 cm ascending thoracic aortic aneurysm.  Abdominal CT February 2020 showed 3.9 cm abdominal aortic aneurysm.  Since I last saw her,   Current Outpatient Medications  Medication Sig Dispense Refill  . albuterol (PROVENTIL) (2.5 MG/3ML) 0.083% nebulizer solution Take 3 mLs (2.5 mg total) by nebulization every 4 (four) hours as needed for wheezing or shortness of breath. 30 vial 0  . ALPRAZolam (XANAX) 0.25 MG tablet TAKE 1-2 TABLETS (0.25MG  TO 0.5MG  TOTAL) BY MOUTH TWICE DAILY AS NEEDED 12 tablet 0  . AMBULATORY NON FORMULARY MEDICATION Medication Name: Needs oxygen 2 liter deliver for hypoxemia. 1 vial 0  . amiodarone (PACERONE) 200 MG tablet TAKE 1 TABLET BY MOUTH EVERY DAY (Patient taking differently: Take 200 mg by mouth daily before breakfast. ) 90 tablet 1  . amLODipine (NORVASC) 5 MG tablet Take 1 tablet (5 mg total) by mouth daily. (Patient taking  differently: Take 5 mg by mouth daily after supper. ) 90 tablet 3  . b complex vitamins tablet Take 1 tablet by mouth daily with supper.    . diltiazem (CARDIZEM CD) 180 MG 24 hr capsule Take 1 capsule (180 mg total) by mouth daily for 30 days. 90 capsule 3  . Docusate Sodium (STOOL SOFTENER LAXATIVE PO) Take 1 capsule by mouth at bedtime as needed (constipation).     . furosemide (LASIX) 20 MG tablet Take 1 tablet (20 mg total) by mouth daily. 90 tablet 3  . lisinopril (PRINIVIL,ZESTRIL) 40 MG tablet TAKE 1 TABLET BY MOUTH EVERY DAY (Patient taking differently: Take 40 mg by mouth daily before breakfast. ) 90 tablet 3  . phenazopyridine (PYRIDIUM) 200 MG tablet Take 1 tablet (200 mg total) by mouth 3 (three) times daily. 6 tablet 0  . polyethylene glycol powder (GLYCOLAX/MIRALAX) powder Take 17 g by mouth daily. (Patient taking differently: Take 17 g by mouth daily as needed (constipation). ) 250 g 2  . predniSONE (DELTASONE) 20 MG tablet Take 2 tablets (40 mg total) by mouth daily with breakfast. 10 tablet 0  . XARELTO 20 MG TABS tablet TAKE 1 TABLET (20 MG TOTAL) BY MOUTH DAILY WITH SUPPER. (Patient taking differently: Take 20 mg by mouth daily after supper. High risk med: Anticoagulant.  Crushed Xarelto can be given down a G-tube but NOT a J-Tube.) 90 tablet 1   No current facility-administered medications for this visit.      Past Medical History:  Diagnosis Date  . AAA (abdominal aortic aneurysm) ([place])   . Anemia    iron deficiency  . Ascending aorta dilatation (  [place])   . Atrial fibrillation ([place])    post op  . CAD (coronary artery disease)   . Carcinoma of lung ([place]) 08/28/2008   T2N0 moderately diff. squamous cell CA  . Cerebrovascular disease   . COPD (chronic obstructive pulmonary disease) ([place]) 2005   on CXR with R apical scaring  . Headache(784.0)    Hx: of migraines in the past  . Hypercholesterolemia    Hx: of  . Hypertension   . Incidental pulmonary nodule, > 57mm and <  34mm    LLL  . S/P aortic valve replacement 08/28/2008   [facility]  . S/P lobectomy of lung 08/28/2008   rt middle lobe  dr. Roxy Manns  . Shingles 11-09    Past Surgical History:  Procedure Laterality Date  . ABDOMINAL SURGERY    . AORTIC VALVE REPLACEMENT  08/28/2008   #41mm [facility] Ease pericardial tissue valve  . APPENDECTOMY    . CARDIAC CATHETERIZATION    . CATARACT EXTRACTION W/ INTRAOCULAR LENS  IMPLANT, BILATERAL Bilateral   . CHOLECYSTECTOMY N/A 12/13/2013   Procedure: LAPAROSCOPIC CHOLECYSTECTOMY;  Surgeon: Ralene Ok, MD;  Location: [facility];  Service: General;  Laterality: N/A;  . COLONOSCOPY     \  . DILATION AND CURETTAGE OF UTERUS    . ERCP N/A 05/08/2015   Procedure: ENDOSCOPIC RETROGRADE CHOLANGIOPANCREATOGRAPHY (ERCP);  Surgeon: Inda Castle, MD;  Location: [facility];  Service: Endoscopy;  Laterality: N/A;  . ERCP N/A 01/13/2016   Procedure: ENDOSCOPIC RETROGRADE CHOLANGIOPANCREATOGRAPHY (ERCP);  Surgeon: Doran Stabler, MD;  Location: [facility];  Service: Endoscopy;  Laterality: N/A;  . ERCP N/A 03/09/2016   Procedure: ENDOSCOPIC RETROGRADE CHOLANGIOPANCREATOGRAPHY (ERCP);  Surgeon: Ladene Artist, MD;  Location: [facility];  Service: Endoscopy;  Laterality: N/A;  . PACEMAKER IMPLANT N/A 12/05/2018   Procedure: PACEMAKER IMPLANT;  Surgeon: Evans Lance, MD;  Location: [facility];  Service: Cardiovascular;  Laterality: N/A;  . RML removed Dr Ricard Dillon for lung cancer  08/28/2008   T2N0 squamous cell CA  . TONSILLECTOMY AND ADENOIDECTOMY    . u/s guided aspiration of R breast cyst   2009   at the breast clinic    Social History   Socioeconomic History  . Marital status: Single    Spouse name: Not on file  . Number of children: Not on file  . Years of education: Not on file  . Highest education level: Not on file  Occupational History  . Not on file  Social Needs  . Financial resource strain: Not on file  . Food insecurity:    Worry: Not  on file    Inability: Not on file  . Transportation needs:    Medical: Not on file    Non-medical: Not on file  Tobacco Use  . Smoking status: Former Smoker    Years: 40.00  . Smokeless tobacco: Never Used  Substance and Sexual Activity  . Alcohol use: No    Alcohol/week: 0.0 standard drinks  . Drug use: No  . Sexual activity: Not on file  Lifestyle  . Physical activity:    Days per week: Not on file    Minutes per session: Not on file  . Stress: Not on file  Relationships  . Social connections:    Talks on phone: Not on file    Gets together: Not on file    Attends religious service: Not on file    Active member of club or organization: Not  on file    Attends meetings of clubs or organizations: Not on file    Relationship status: Not on file  . Intimate partner violence:    Fear of current or ex partner: Not on file    Emotionally abused: Not on file    Physically abused: Not on file    Forced sexual activity: Not on file  Other Topics Concern  . Not on file  Social History Narrative  . Not on file    Family History  Problem Relation Age of Onset  . Stroke Father   . Diabetes Son        brittle diabetes  . Colon polyps Neg Hx   . Colon cancer Neg Hx   . Esophageal cancer Neg Hx   . Stomach cancer Neg Hx   . Pancreatic cancer Neg Hx   . Kidney disease Neg Hx   . Liver disease Neg Hx     ROS: no fevers or chills, productive cough, hemoptysis, dysphasia, odynophagia, melena, hematochezia, dysuria, hematuria, rash, seizure activity, orthopnea, PND, pedal edema, claudication. Remaining systems are negative.  Physical Exam: Well-developed well-nourished in no acute distress.  Skin is warm and dry.  HEENT is normal.  Neck is supple.  Chest is clear to auscultation with normal expansion.  Cardiovascular exam is regular rate and rhythm.  Abdominal exam nontender or distended. No masses palpated. Extremities show no edema. neuro grossly intact  ECG- personally  reviewed  A/P  1 paroxysmal atrial fibrillation-patient remains in sinus rhythm today.  Continue present dose of amiodarone.  Check TSH, liver functions and chest x-ray.  Continue Xarelto.  2 prior aortic valve replacement-continue SBE prophylaxis.  3 hypertension-patient's blood pressure is controlled.  Continue present medications and follow.  4 hyperlipidemia-patient is intolerant to statins.  Continue diet.  5 coronary artery disease-no chest pain.  We are not treating with aspirin given need for Xarelto.  Intolerant to statins.  6 peripheral vascular disease-managed by Dr. Fletcher Anon  7 history of thoracic, abdominal and iliac aneurysms-patient has declined further imaging previously as she felt "too old" for any intervention.  8 status post pacemaker-follow-up Dr. Lovena Le.  [name], MD

## 2019-01-26 ENCOUNTER — Telehealth: Payer: Self-pay | Admitting: Cardiology

## 2019-01-26 ENCOUNTER — Other Ambulatory Visit: Payer: Self-pay

## 2019-01-26 MED ORDER — AMIODARONE HCL 200 MG PO TABS: 200.0000 mg | ORAL_TABLET | Freq: Every day | ORAL | 0 refills | Status: DC

## 2019-01-26 NOTE — Telephone Encounter (Signed)
° ° °  Pt c/o BP issue: STAT if pt c/o blurred vision, one-sided weakness or slurred speech  1. What are your last 5 BP readings? 121/71, 97/78  2. Are you having any other symptoms (ex. Dizziness, headache, blurred vision, passed out)? dizziness  3. What is your BP issue? Patient states BP too low

## 2019-01-26 NOTE — Telephone Encounter (Signed)
Triage call received from the operator. Patient states that she has not spoken with anyone earlier today and she would like to speak with a nurse. I reminded the patient of the previous conversation that she has with the other triage nurse Altha Harm. She then remembered the conversation and says that she has not received a call back. Advise patient that the message was fwd to Dr.Crenshaw and we are awaiting his response. Patient sts that she dis have salt and she has been drinking since her previous call. Her systolic BP went up to 917 and 124. She is unable to provide the diastolic or HR. She complaint is that she has no appetite and feels sick to her stomach. Adv pt to try and have to eat and and drink. Adv pt that I will fwd another the update to [facility] and we will call back with his recommendation. Patient verbalizes understanding.

## 2019-01-26 NOTE — Telephone Encounter (Signed)
Spoke with Laurie [name] and has noted several days where she was dizzy but did not check B/P also notes no appetite with little weight loss .B/P was low this am and has not taken am meds (Lisinopril,Lasix,and Amiodarone ) heart rate running around 65  Instructed Laurie [name] to eat small serving of chips or crackers (something salty )Laurie [name] had appt yesterday in [facility] but canceled because felt bad.Also encouraged Laurie [name] monitor B/P and HR and keep log  Will forward to Dr Stanford Breed for review .

## 2019-01-26 NOTE — Telephone Encounter (Signed)
Rx(s) sent to pharmacy electronically.  

## 2019-01-27 MED ORDER — LISINOPRIL 20 MG PO TABS: 20.0000 mg | ORAL_TABLET | Freq: Every day | ORAL | 3 refills | Status: DC

## 2019-01-27 MED ORDER — AMIODARONE HCL 200 MG PO TABS: 200.0000 mg | ORAL_TABLET | Freq: Every day | ORAL | 3 refills | Status: DC

## 2019-01-27 NOTE — Telephone Encounter (Signed)
Spoke with pt, Aware of dr crenshaw's recommendations. New script sent to the pharmacy  

## 2019-01-27 NOTE — Telephone Encounter (Signed)
Decrease lisinopril to 20 mg daily; hold lasix for 3 days then resume; follow BP [name]

## 2019-01-27 NOTE — Telephone Encounter (Signed)
Left message for pt to call.

## 2019-01-31 ENCOUNTER — Other Ambulatory Visit: Payer: Self-pay | Admitting: Family Medicine

## 2019-01-31 ENCOUNTER — Telehealth: Payer: Self-pay | Admitting: Cardiology

## 2019-01-31 DIAGNOSIS — F411 Generalized anxiety disorder: Secondary | ICD-10-CM

## 2019-01-31 NOTE — Telephone Encounter (Signed)
Returned call to patient she stated she would like to see Dr.Crenshaw in [place] sooner than 02/15/19.Patient refuses to see a PA.Stated she has been feeling weak,dizzy,lightheaded,no appetite.B/P low readings listed below,pulse 77,80.Today B/P 109/71 pulse 77.Stated 1 month ago she stood up and blacked out, she woke up and called 911 she was taken to [facility] ED.She cracked 4 ribs.She has not blacked out since.Advised I will send message to Dr.Crenshaw's RN for a sooner appointment.

## 2019-01-31 NOTE — Telephone Encounter (Signed)
° °  Pt c/o BP issue: STAT if pt c/o blurred vision, one-sided weakness or slurred speech  1. What are your last 5 BP readings? 110/65, 109/71, 97/?  2. Are you having any other symptoms (ex. Dizziness, headache, blurred vision, passed out)? lightheaded  3. What is your BP issue? Wants to know if medication needs to be adjusted, BP low

## 2019-01-31 NOTE — Telephone Encounter (Signed)
Left message for patient appt scheduled for 02-08-2019 @ 4:20 pm in [place]

## 2019-02-02 ENCOUNTER — Telehealth: Payer: Self-pay | Admitting: Cardiology

## 2019-02-02 NOTE — Telephone Encounter (Signed)
Spoke wit the pt and she report that she is very weak and her BP is still very low 90/40.Marland Kitchen she cannot barely walk.. she is anxious about her low sodium so she has been eating a large increased amount of salt.. she has pouring into her hand and eating it.. she still is not feeling better.. I advised her that she could go to the ER  And they can check her VS, labs, and replace her fluids if necessary safely. Pt agreed and is having someone take her to [facility].

## 2019-02-02 NOTE — Telephone Encounter (Signed)
New message   Patient states that she is very weak and wants to see if an appt can be setup before her appt on 02/08/2019.

## 2019-02-03 NOTE — Telephone Encounter (Signed)
Left message for pt to call. I do not see that she went to the ER.

## 2019-02-03 NOTE — Progress Notes (Signed)
HPI: FU atrial fibrillation, CAD, ho aortic stenosis status post aortic valve replacement in September 2009. She had a pericardial tissue valve; also had a septal myectomy and a right middle lobectomy due to lung CA. Note, she had a 50% mid LAD by catheterization preoperatively. Echocardiogram April 2017 showed normal LV function, grade 2 diastolic dysfunction, prior aortic valve replacement with mean gradient 6 mmHg. Carotid Dopplers November 2017 showed mild bilateral atherosclerosis and no hemodynamically significant stenosis. Dopplers 5/18: ABI of 0.57 on the right and 0.76 on the left. Waveforms were suggestive of inflow disease. Duplex showed significant distal aortic and bilateral common iliac artery stenosis with moderate left SFA stenosis.Seen by Dr Fletcher Anon and medical therapy recommended for now.Amiodarone added at previous ov for recurrent atrial fibrillation. Patient was admitted January 2020 with recurrent atrial fibrillation and was noted to have posttermination pauses.  Pacemaker was placed.  Chest CT January 2020 showed 4.6 cm ascending thoracic aortic aneurysm.  Abdominal CT February 2020 showed 3.9 cm abdominal aortic aneurysm.  Since I last saw her,  she denies dyspnea, chest pain or palpitations.  No pedal edema.  She has generalized weakness and loss of appetite.  Current Outpatient Medications  Medication Sig Dispense Refill  . albuterol (PROVENTIL) (2.5 MG/3ML) 0.083% nebulizer solution Take 3 mLs (2.5 mg total) by nebulization every 4 (four) hours as needed for wheezing or shortness of breath. 30 vial 0  . ALPRAZolam (XANAX) 0.25 MG tablet TAKE 1-2 TABLETS (0.25MG  TO 0.5MG  TOTAL) BY MOUTH TWICE DAILY AS NEEDED 110 tablet 0  . amiodarone (PACERONE) 200 MG tablet Take 1 tablet (200 mg total) by mouth daily. 90 tablet 3  . amLODipine (NORVASC) 5 MG tablet Take 1 tablet (5 mg total) by mouth daily. (Patient taking differently: Take 5 mg by mouth daily after supper. ) 90 tablet 3   . Docusate Sodium (STOOL SOFTENER LAXATIVE PO) Take 1 capsule by mouth at bedtime as needed (constipation).     . furosemide (LASIX) 20 MG tablet Take 1 tablet (20 mg total) by mouth daily. 90 tablet 3  . lisinopril (PRINIVIL,ZESTRIL) 20 MG tablet Take 1 tablet (20 mg total) by mouth daily. 90 tablet 3  . polyethylene glycol powder (GLYCOLAX/MIRALAX) powder Take 17 g by mouth daily. (Patient taking differently: Take 17 g by mouth daily as needed (constipation). ) 250 g 2  . XARELTO 20 MG TABS tablet TAKE 1 TABLET (20 MG TOTAL) BY MOUTH DAILY WITH SUPPER. (Patient taking differently: Take 20 mg by mouth daily after supper. High risk med: Anticoagulant.  Crushed Xarelto can be given down a G-tube but NOT a J-Tube.) 90 tablet 1   No current facility-administered medications for this visit.      Past Medical History:  Diagnosis Date  . AAA (abdominal aortic aneurysm) ([place])   . Anemia    iron deficiency  . Ascending aorta dilatation ([facility])   . Atrial fibrillation ([place])    post op  . CAD (coronary artery disease)   . Carcinoma of lung ([place]) 08/28/2008   T2N0 moderately diff. squamous cell CA  . Cerebrovascular disease   . COPD (chronic obstructive pulmonary disease) ([place]) 2005   on CXR with R apical scaring  . Headache(784.0)    Hx: of migraines in the past  . Hypercholesterolemia    Hx: of  . Hypertension   . Incidental pulmonary nodule, > 32mm and < 64mm    LLL  . S/P aortic valve replacement 08/28/2008  [facility]  . S/P lobectomy of lung 08/28/2008   rt middle lobe  dr. Roxy Manns  . Shingles 11-09    Past Surgical History:  Procedure Laterality Date  . ABDOMINAL SURGERY    . AORTIC VALVE REPLACEMENT  08/28/2008   #26mm [facility] Ease pericardial tissue valve  . APPENDECTOMY    . CARDIAC CATHETERIZATION    . CATARACT EXTRACTION W/ INTRAOCULAR LENS  IMPLANT, BILATERAL Bilateral   . CHOLECYSTECTOMY N/A 12/13/2013   Procedure: LAPAROSCOPIC CHOLECYSTECTOMY;  Surgeon: Ralene Ok,  MD;  Location: [facility];  Service: General;  Laterality: N/A;  . COLONOSCOPY     \  . DILATION AND CURETTAGE OF UTERUS    . ERCP N/A 05/08/2015   Procedure: ENDOSCOPIC RETROGRADE CHOLANGIOPANCREATOGRAPHY (ERCP);  Surgeon: Inda Castle, MD;  Location: [facility];  Service: Endoscopy;  Laterality: N/A;  . ERCP N/A 01/13/2016   Procedure: ENDOSCOPIC RETROGRADE CHOLANGIOPANCREATOGRAPHY (ERCP);  Surgeon: Doran Stabler, MD;  Location: [facility];  Service: Endoscopy;  Laterality: N/A;  . ERCP N/A 03/09/2016   Procedure: ENDOSCOPIC RETROGRADE CHOLANGIOPANCREATOGRAPHY (ERCP);  Surgeon: Ladene Artist, MD;  Location: [facility];  Service: Endoscopy;  Laterality: N/A;  . PACEMAKER IMPLANT N/A 12/05/2018   Procedure: PACEMAKER IMPLANT;  Surgeon: Evans Lance, MD;  Location: [facility];  Service: Cardiovascular;  Laterality: N/A;  . RML removed Dr Ricard Dillon for lung cancer  08/28/2008   T2N0 squamous cell CA  . TONSILLECTOMY AND ADENOIDECTOMY    . u/s guided aspiration of R breast cyst   2009   at the breast clinic    Social History   Socioeconomic History  . Marital status: Single    Spouse name: Not on file  . Number of children: Not on file  . Years of education: Not on file  . Highest education level: Not on file  Occupational History  . Not on file  Social Needs  . Financial resource strain: Not on file  . Food insecurity:    Worry: Not on file    Inability: Not on file  . Transportation needs:    Medical: Not on file    Non-medical: Not on file  Tobacco Use  . Smoking status: Former Smoker    Years: 40.00  . Smokeless tobacco: Never Used  Substance and Sexual Activity  . Alcohol use: No    Alcohol/week: 0.0 standard drinks  . Drug use: No  . Sexual activity: Not on file  Lifestyle  . Physical activity:    Days per week: Not on file    Minutes per session: Not on file  . Stress: Not on file  Relationships  . Social connections:    Talks on phone: Not on file      Gets together: Not on file    Attends religious service: Not on file    Active member of club or organization: Not on file    Attends meetings of clubs or organizations: Not on file    Relationship status: Not on file  . Intimate partner violence:    Fear of current or ex partner: Not on file    Emotionally abused: Not on file    Physically abused: Not on file    Forced sexual activity: Not on file  Other Topics Concern  . Not on file  Social History Narrative  . Not on file    Family History  Problem Relation Age of Onset  . Stroke Father   . Diabetes Son  brittle diabetes  . Colon polyps Neg Hx   . Colon cancer Neg Hx   . Esophageal cancer Neg Hx   . Stomach cancer Neg Hx   . Pancreatic cancer Neg Hx   . Kidney disease Neg Hx   . Liver disease Neg Hx     ROS: Generalized weakness and loss of appetite but no fevers or chills, productive cough, hemoptysis, dysphasia, odynophagia, melena, hematochezia, dysuria, hematuria, rash, seizure activity, orthopnea, PND, pedal edema, claudication. Remaining systems are negative.  Physical Exam: Well-developed well-nourished in no acute distress.  Skin is warm and dry.  HEENT is normal.  Neck is supple.  Chest is clear to auscultation with normal expansion.  Cardiovascular exam is regular rate and rhythm.  2/6 systolic murmur left sternal border.  No diastolic murmur. Abdominal exam nontender or distended. No masses palpated. Extremities show no edema. neuro grossly intact   A/P  1 paroxysmal atrial fibrillation-patient remains in sinus rhythm today on exam.  Continue present dose of amiodarone.  Check TSH; recent liver functions and chest x-ray reviewed.  Continue Xarelto.  Note recent GFR 48.  However I am discontinuing Lasix.  Recheck renal function in 2 weeks and if GFR less than 48 will need to decrease Xarelto to 15 mg daily.  2 prior aortic valve replacement-continue SBE prophylaxis.  Repeat echocardiogram.  3  hypertension-patient's blood pressure is controlled.  However I am discontinuing Lasix as she is having decreased appetite and possible orthostatic symptoms.  Follow and resume Lasix if needed.  Check potassium and renal function in 2 weeks.  4 hyperlipidemia-patient is intolerant to statins.  Continue diet.  5 coronary artery disease-no chest pain.  We are not treating with aspirin given need for Xarelto.  Intolerant to statins.  6 peripheral vascular disease-managed by Dr. Fletcher Anon  7 history of thoracic, abdominal and iliac aneurysms-patient was unclear whether she would ever have any intervention for these issues in the past.  She is now considering and we will arrange follow-up imaging if she would ever be agreeable to intervention.  We will discuss at next office visit.  8 status post pacemaker-follow-up Dr. Lovena Le.  [name], MD

## 2019-02-03 NOTE — Telephone Encounter (Signed)
Spoke with pt, she did not go to the ER, she has been drinking Gatorade and is feeling better today. She has a follow up appointment next Wednesday.

## 2019-02-07 ENCOUNTER — Encounter (HOSPITAL_COMMUNITY): Payer: Self-pay | Admitting: Emergency Medicine

## 2019-02-07 ENCOUNTER — Other Ambulatory Visit: Payer: Self-pay

## 2019-02-07 ENCOUNTER — Emergency Department (HOSPITAL_COMMUNITY)
Admission: EM | Admit: 2019-02-07 | Discharge: 2019-02-07 | Disposition: A | Discharge status: Home / Self Care | Payer: Medicare HMO | Attending: Emergency Medicine | Admitting: Emergency Medicine

## 2019-02-07 DIAGNOSIS — Z5321 Procedure and treatment not carried out due to patient leaving prior to being seen by health care provider: Secondary | ICD-10-CM | POA: Insufficient documentation

## 2019-02-07 DIAGNOSIS — R42 Dizziness and giddiness: Secondary | ICD-10-CM | POA: Insufficient documentation

## 2019-02-07 DIAGNOSIS — I959 Hypotension, unspecified: Secondary | ICD-10-CM | POA: Diagnosis not present

## 2019-02-07 LAB — CBC
HCT: 43.4 % (ref 36.0–46.0)
Hemoglobin: 13.8 g/dL (ref 12.0–15.0)
MCH: 27.9 pg (ref 26.0–34.0)
MCHC: 31.8 g/dL (ref 30.0–36.0)
MCV: 87.9 fL (ref 80.0–100.0)
Platelets: 230 10*3/uL (ref 150–400)
RBC: 4.94 MIL/uL (ref 3.87–5.11)
RDW: 13.1 % (ref 11.5–15.5)
WBC: 9.2 10*3/uL (ref 4.0–10.5)
nRBC: 0 % (ref 0.0–0.2)

## 2019-02-07 LAB — BASIC METABOLIC PANEL
Anion gap: 9 (ref 5–15)
BUN: 8 mg/dL (ref 8–23)
CO2: 27 mmol/L (ref 22–32)
Calcium: 9.1 mg/dL (ref 8.9–10.3)
Chloride: 94 mmol/L — ABNORMAL LOW (ref 98–111)
Creatinine, Ser: 0.86 mg/dL (ref 0.44–1.00)
GFR calc Af Amer: >60 mL/min (ref >60)
GFR calc non Af Amer: >60 mL/min (ref >60)
GLUCOSE: 104 mg/dL — AB (ref 70–99)
Potassium: 4 mmol/L (ref 3.5–5.1)
Sodium: 130 mmol/L — ABNORMAL LOW (ref 135–145)

## 2019-02-07 NOTE — Telephone Encounter (Signed)
  Patient is calling from the Emergency Dept and wants to speak with Fredia Beets.

## 2019-02-07 NOTE — Telephone Encounter (Signed)
Returned call to patient she stated she is presently at [facility] ED.Stated she was told to go there due to low B/P and feeling so weak.Stated they have drawn blood and she has been waiting for 1 hour.Patient reassured.Advised I will let Hilda Blades know.

## 2019-02-07 NOTE — ED Triage Notes (Signed)
Onset 2 days ago developed dizziness and lightheadedness.Patient been taking her own BP stated systolic 49'P called doctor told to go to the ED for evaluation. EMS reported BP 120/70. Alert answering and following commands appropriate.

## 2019-02-07 NOTE — ED Notes (Signed)
Pt came to desk in the waiting room stating she would like to leave,pt encouraged to stay but pt stated she has an appointment with her PCP tomorrow so she will see them then. Armband removed.

## 2019-02-08 ENCOUNTER — Other Ambulatory Visit: Payer: Self-pay

## 2019-02-08 ENCOUNTER — Ambulatory Visit (INDEPENDENT_AMBULATORY_CARE_PROVIDER_SITE_OTHER): Payer: Medicare HMO | Admitting: Cardiology

## 2019-02-08 ENCOUNTER — Encounter: Payer: Self-pay | Admitting: Cardiology

## 2019-02-08 VITALS — BP 136/74 | HR 72 | Ht 66.0 in | Wt 166.8 lb

## 2019-02-08 DIAGNOSIS — I2510 Atherosclerotic heart disease of native coronary artery without angina pectoris: Secondary | ICD-10-CM | POA: Diagnosis not present

## 2019-02-08 DIAGNOSIS — I480 Paroxysmal atrial fibrillation: Secondary | ICD-10-CM

## 2019-02-08 DIAGNOSIS — I359 Nonrheumatic aortic valve disorder, unspecified: Secondary | ICD-10-CM

## 2019-02-08 DIAGNOSIS — I10 Essential (primary) hypertension: Secondary | ICD-10-CM

## 2019-02-08 NOTE — Patient Instructions (Signed)
Medication Instructions:  STOP FUROSEMIDE  If you need a refill on your cardiac medications before your next appointment, please call your pharmacy.   Lab work: Your physician recommends that you return for lab work in: 2 WEEKS  If you have labs (blood work) drawn today and your tests are completely normal, you will receive your results only by: Marland Kitchen MyChart Message (if you have MyChart) OR . A paper copy in the mail If you have any lab test that is abnormal or we need to change your treatment, we will call you to review the results.  Testing/Procedures: Your physician has requested that you have an echocardiogram. Echocardiography is a painless test that uses sound waves to create images of your heart. It provides your doctor with information about the size and shape of your heart and how well your heart's chambers and valves are working. This procedure takes approximately one hour. There are no restrictions for this procedure.  [place]  Follow-Up: Your physician recommends that you schedule a follow-up appointment in: [place]

## 2019-02-09 ENCOUNTER — Telehealth: Payer: Self-pay

## 2019-02-09 MED ORDER — OXYCODONE HCL 5 MG PO TABS: 5.0000 mg | ORAL_TABLET | Freq: Four times a day (QID) | ORAL | 0 refills | Status: AC | PRN

## 2019-02-09 NOTE — Telephone Encounter (Signed)
Kamree called and states this morning she is having a lot of back pain, rib fracture. She would like a few of the Codeine. Please advise.

## 2019-02-09 NOTE — Telephone Encounter (Signed)
Call patient: I did refill it from last month but gave her 20 tabs.  If after that she still having significant enough pain that she is requiring her narcotic pain medication then she needs to be evaluated again.

## 2019-02-10 NOTE — Telephone Encounter (Signed)
Patient is aware 

## 2019-02-10 NOTE — Telephone Encounter (Signed)
Left a message advising patient to schedule an appointment if still in pain.

## 2019-02-15 ENCOUNTER — Ambulatory Visit: Payer: Medicare HMO | Admitting: Cardiology

## 2019-02-16 ENCOUNTER — Telehealth: Payer: Self-pay | Admitting: Internal Medicine

## 2019-02-16 NOTE — Telephone Encounter (Signed)
  Patient had pacemaker placed in January 2020. Today she states feels her heart beating around the pacemaker and she feels sore in that area. She wants to know if something could go wrong with the pacemaker. She feels light headed also.

## 2019-02-16 NOTE — Telephone Encounter (Signed)
Spoke with patient. She reports her PPM site is sore with arm movement, laying on left side. Only sore since about last week. Hasn't noticed any other issues with site, had Dr. Stanford Breed look at site at her [place] on 3/11 and reports he told her everything was fine.  Assisted pt with sending PPM transmission for review. Normal device function. Lead trends stable. Presenting rhythm NSR @ 78bpm. Ap 27%, Vp <1%. <1% AT/AF burden, +Xarelto, longest 1hr 40min on 12/06/18, most recent on 3/12, duration 4sec. 2 HVR--both atrially-driven, occurred on 05/06/88. Advised pt of normal device function.  Patient's biggest concern today is dizziness. She notices dizziness a little at rest, but reports it's worse with standing and walking or when getting out of bed. B/P today at 11:00 was 115/70, HR 83bpm.   She has been eating normally and drinking water today, 2 large glasses so far. Has been drinking Gatorade for the past few days, ran out yesterday. Noted both feet were swollen yesterday, took furosemide 20mg  (was instructed to stop furosemide at 3/11 appt), didn't urinate much during the day but did overnight.  Takes amiodarone and lisinopril in AM, amlodipine and Xarelto at dinner time. Encouraged pt to change positions slowly and to avoid taking any more furosemide unless instructed otherwise. Advised to check BP tonight before taking amlodipine, hold for SBP <100. Pt verbalizes understanding. Advised I will route to [facility] triage for further assistance. ED precautions given for new or worsening symptoms. Pt verbalizes understanding thanked me for my call.

## 2019-02-17 NOTE — Telephone Encounter (Signed)
Spoke with pt, Aware of dr crenshaw's recommendations.  °

## 2019-02-17 NOTE — Telephone Encounter (Signed)
DC amlodipine and follow BP [name]

## 2019-02-20 ENCOUNTER — Other Ambulatory Visit (HOSPITAL_BASED_OUTPATIENT_CLINIC_OR_DEPARTMENT_OTHER): Payer: Medicare HMO

## 2019-03-02 ENCOUNTER — Telehealth: Payer: Self-pay | Admitting: Internal Medicine

## 2019-03-02 NOTE — Telephone Encounter (Signed)
New message   Patient rescheduled appt to first week in August. She said she has a smart phone but was not sure how to use it. She said she can get her son to help her next time he is in town. Pt scheduled for August 5th at 2pm.

## 2019-03-03 ENCOUNTER — Telehealth: Payer: Self-pay

## 2019-03-03 ENCOUNTER — Ambulatory Visit (INDEPENDENT_AMBULATORY_CARE_PROVIDER_SITE_OTHER): Payer: Medicare HMO | Admitting: Family Medicine

## 2019-03-03 ENCOUNTER — Encounter: Payer: Self-pay | Admitting: Family Medicine

## 2019-03-03 VITALS — Temp 98.6°F | Wt 166.0 lb

## 2019-03-03 DIAGNOSIS — F411 Generalized anxiety disorder: Secondary | ICD-10-CM | POA: Diagnosis not present

## 2019-03-03 DIAGNOSIS — H9201 Otalgia, right ear: Secondary | ICD-10-CM | POA: Diagnosis not present

## 2019-03-03 MED ORDER — AMOXICILLIN-POT CLAVULANATE 875-125 MG PO TABS: 1.0000 | ORAL_TABLET | Freq: Two times a day (BID) | ORAL | 0 refills | Status: DC

## 2019-03-03 MED ORDER — ALPRAZOLAM 0.5 MG PO TABS: ORAL_TABLET | ORAL | 0 refills | Status: DC

## 2019-03-03 NOTE — Progress Notes (Signed)
Virtual Visit via Telephone Note  I connected with [name] on 03/03/19 at  3:00 PM EDT by telephone and verified that I am speaking with the correct person using two identifiers.   I discussed the limitations, risks, security and privacy concerns of performing an evaluation and management service by telephone and the availability of in person appointments. I also discussed with the patient that there may be a patient responsible charge related to this service. The patient expressed understanding and agreed to proceed.   Subjective:    CC: Ear Pain   HPI:  Started with pain behind her RIGHT ear on Wednesday night and then started into her ear. Now it is radiating into her jaw almost like a toothache but says she has had those teeth pulled.  No fever. No drainage from ear.  When she swallows saliva it feels sore in her ear.  No hearing loss.  No cold.  She does have allergies.  Not on any meds for allergies.  No cough.Tried Tramadol but says it did not reduce her pain at all.  In fact it was so painful last night that it kept her awake.Marland Kitchen Has some tylenol.  No recent cold or upper respiratory symptoms.  Anxiety-she is asking for refill on her Xanax today.  She says she does not like taking the 0.25 she would prefer to go back to the 0.5 mg tabs which is what she was taking previously and be able to either split them or take a whole.  Past medical history, Surgical history, Family history not pertinant except as noted below, Social history, Allergies, and medications have been entered into the medical record, reviewed, and corrections made.   Review of Systems: No fevers, chills, night sweats, weight loss, chest pain, or shortness of breath.   Objective:    General: Speaking clearly in complete sentences without any shortness of breath.  Alert and oriented x3.  Normal judgment. No apparent acute distress.    Impression and Recommendations:    Right ear pain-new problem.  Based on  her description I think is most consistent with an inner ear infection versus an outer ear infection she is not having any drainage or blockage or fullness in the ear.  It does sound like it is radiating into her jaw.  We will go ahead and treat with Augmentin.  It does not sound like this was preceded by any type of cold or respiratory symptoms though she does have allergies.  She says the tramadol was not helpful.  She does have Tylenol at home and I would like for her to at least try that she really cannot take NSAIDs because she is on Xarelto.  We could call in something stronger if needed but I think getting on the antibiotic is what is going to be most helpful for her.  Anxiety-I did go ahead and refill her alprazolam and switched her back to the point fives.  We discussed previously the importance of really weaning down on her benzodiazepine use.  Strongly encouraged her to take a half a tab when able instead of a whole tab so that 60 tabs is actually lasting her more than 30 days.   I discussed the assessment and treatment plan with the patient. The patient was provided an opportunity to ask questions and all were answered. The patient agreed with the plan and demonstrated an understanding of the instructions.   The patient was advised to call back or seek an in-person evaluation  if the symptoms worsen or if the condition fails to improve as anticipated.   Beatrice Lecher, MD

## 2019-03-03 NOTE — Telephone Encounter (Signed)
Put her on schedule for 3 today and I will call her.  Front can arrive her before they leave at noon.

## 2019-03-03 NOTE — Telephone Encounter (Signed)
Zabella called and complains of right ear pain for 2 days. Denies fever, chills or sweats. There are no openings for today. Please advise.

## 2019-03-03 NOTE — Telephone Encounter (Signed)
Laurie [name] will call and schedule patient.

## 2019-03-06 ENCOUNTER — Ambulatory Visit: Payer: PRIVATE HEALTH INSURANCE | Admitting: Family Medicine

## 2019-03-08 ENCOUNTER — Encounter: Payer: Medicare HMO | Admitting: Internal Medicine

## 2019-03-15 ENCOUNTER — Ambulatory Visit (HOSPITAL_BASED_OUTPATIENT_CLINIC_OR_DEPARTMENT_OTHER): Payer: Medicare HMO

## 2019-03-15 ENCOUNTER — Telehealth: Payer: Self-pay | Admitting: Cardiology

## 2019-03-15 NOTE — Telephone Encounter (Signed)
Needs bmet to see if GFR improved prior to dosing [name]

## 2019-03-15 NOTE — Telephone Encounter (Signed)
Spoke with pt, she is due to refill on xarelto but at last office visit dr Stanford Breed mentioned changing to a different dose. She wanted to make the change if needed prior to getting refill. Will forward for dr Stanford Breed review

## 2019-03-15 NOTE — Telephone Encounter (Signed)
Spoke with pt, Aware of dr Jacalyn Lefevre recommendations. She has the orders for the labs and will wait until the virus lets up and then go. She will get the xarelto filled until then.

## 2019-03-15 NOTE — Telephone Encounter (Signed)
New Message:     Please call, question about her medicine.

## 2019-03-24 ENCOUNTER — Other Ambulatory Visit: Payer: Self-pay | Admitting: Family Medicine

## 2019-03-24 NOTE — Telephone Encounter (Signed)
It was when she was discharged from the hospital and she was given Oxycodone for pain.Laurie [name], [place]

## 2019-03-24 NOTE — Telephone Encounter (Signed)
Please call patient and see what is going on.  It looks like this medication was discontinued by another provider.

## 2019-03-27 ENCOUNTER — Ambulatory Visit: Payer: PRIVATE HEALTH INSURANCE | Admitting: Physician Assistant

## 2019-04-04 ENCOUNTER — Encounter: Payer: Self-pay | Admitting: Osteopathic Medicine

## 2019-04-04 ENCOUNTER — Other Ambulatory Visit: Payer: Self-pay

## 2019-04-04 ENCOUNTER — Ambulatory Visit (INDEPENDENT_AMBULATORY_CARE_PROVIDER_SITE_OTHER): Payer: Medicare HMO | Admitting: Osteopathic Medicine

## 2019-04-04 ENCOUNTER — Ambulatory Visit (INDEPENDENT_AMBULATORY_CARE_PROVIDER_SITE_OTHER): Payer: Medicare HMO

## 2019-04-04 VITALS — BP 115/71 | HR 90 | Temp 97.5°F | Wt 167.4 lb

## 2019-04-04 DIAGNOSIS — K5900 Constipation, unspecified: Secondary | ICD-10-CM | POA: Diagnosis not present

## 2019-04-04 DIAGNOSIS — E871 Hypo-osmolality and hyponatremia: Secondary | ICD-10-CM

## 2019-04-04 DIAGNOSIS — R109 Unspecified abdominal pain: Secondary | ICD-10-CM | POA: Diagnosis not present

## 2019-04-04 DIAGNOSIS — R1030 Lower abdominal pain, unspecified: Secondary | ICD-10-CM | POA: Diagnosis not present

## 2019-04-04 LAB — POCT URINALYSIS DIPSTICK
Bilirubin, UA: NEGATIVE
Blood, UA: NEGATIVE
Glucose, UA: NEGATIVE
Ketones, UA: NEGATIVE
Leukocytes, UA: NEGATIVE
Nitrite, UA: NEGATIVE
Protein, UA: NEGATIVE
Spec Grav, UA: 1.015 (ref 1.010–1.025)
Urobilinogen, UA: 0.2 E.U./dL
pH, UA: 7 (ref 5.0–8.0)

## 2019-04-04 MED ORDER — MAGNESIUM CITRATE PO SOLN: ORAL | 0 refills | Status: DC

## 2019-04-04 MED ORDER — ACETAMINOPHEN 500 MG PO TABS: 1000.0000 mg | ORAL_TABLET | Freq: Three times a day (TID) | ORAL | 0 refills | Status: AC | PRN

## 2019-04-04 NOTE — Progress Notes (Signed)
HPI: Laurie [name] is a 84 y.o. female who  has a past medical history of AAA (abdominal aortic aneurysm) ([place]), Anemia, Ascending aorta dilatation (HCC), Atrial fibrillation ([place]), CAD (coronary artery disease), Carcinoma of lung ([place]) (08/28/2008), Cerebrovascular disease, COPD (chronic obstructive pulmonary disease) ([place]) (2005), Headache(784.0), Hypercholesterolemia, Hypertension, Incidental pulmonary nodule, > 46mm and < 89mm, S/P aortic valve replacement (08/28/2008), S/P lobectomy of lung (08/28/2008), and Shingles (11-09).  she presents to [facility] today, 04/04/19,  for chief complaint of:  Abdominal pain   . Location: lower abdomen pain worse on R . Quality: constipation, occasional sharp pain, bloating . Severity: worse lately so came in for acute visit . Duration: months . Modifying factors: stool softener and MiraLax powder don't seem to be as helpful as they used to be     At today's visit 04/04/19 ... PMH, PSH, FH reviewed and updated as needed.  Current medication list and allergy/intolerance hx reviewed and updated as needed. (See remainder of HPI, ROS, Phys Exam below)   Dg Abd 2 Views  Result Date: 04/04/2019 CLINICAL DATA:  Lower abdominal pain for weeks. EXAM: ABDOMEN - 2 VIEW COMPARISON:  CT abdomen and pelvis 01/17/2019. FINDINGS: The bowel gas pattern is normal. Moderate to moderately large stool burden. There is no evidence of free air. No radio-opaque calculi or other significant radiographic abnormality is seen. IMPRESSION: No acute finding. Moderate to moderately large stool burden. Electronically Signed   By: Inge Rise M.D.   On: 04/04/2019 13:07    Results for orders placed or performed in visit on 04/04/19 (from the past 72 hour(s))  POCT Urinalysis Dipstick     Status: None   Collection Time: 04/04/19 12:08 PM  Result Value Ref Range   Color, UA Yellow    Clarity, UA Clear    Glucose, UA Negative Negative    Bilirubin, UA Negative    Ketones, UA Negative    Spec Grav, UA 1.015 1.010 - 1.025   Blood, UA Negative    pH, UA 7.0 5.0 - 8.0   Protein, UA Negative Negative   Urobilinogen, UA 0.2 0.2 or 1.0 E.U./dL   Nitrite, UA Negative    Leukocytes, UA Negative Negative   Appearance     Odor       Labs reviewed:  BMP and TSH ordered 02/08/2019 and never drawn  Hyponatremic and hypochloremic on BMP 02/07/2019 and on CMP 01/17/2019  Imaging reviewed:  CXR 01/19/2019: no acute findings, (+)COPD, PM/AVR CT Abd/Pelv w/ contrast 12/2018: performed for generalized abdominal pain, showed large stool volume, other stable findings, see report      ASSESSMENT/PLAN: The primary encounter diagnosis was Constipation, unspecified constipation type. A diagnosis of Hyponatremia was also pertinent to this visit.   No impaction on rectal exam  Pt requests "pain meds" declined Toradol injection, Rx for Tylenol given  Pt states she is physically active and well hydrated, advised continue these helpful habits but I think we need to address constipation w/ escalation of medications and follow up w/ PCP to discuss prevention   Lab abn, w/u for hyponatremia, caution w/ bowel prep, would not want to worsen hyponatremia w/ diarrhea, pt advised needs close follow-up   Orders Placed This Encounter  Procedures  . Urine Culture  . DG Abd 2 Views  . CBC  . COMPLETE METABOLIC PANEL WITH GFR  . TSH  . T4, free  . Osmolality  . Osmolality, urine  . Sodium, urine, random  .  Urinalysis, microscopic only  . POCT Urinalysis Dipstick     Meds ordered this encounter  Medications  . magnesium citrate SOLN    Sig: 150 mL 1 - 2 times po daily prn severe constipaiton    Dispense:  500 mL    Refill:  0  . acetaminophen (TYLENOL) 500 MG tablet    Sig: Take 2 tablets (1,000 mg total) by mouth every 8 (eight) hours as needed (pain).    Dispense:  30 tablet    Refill:  0    Patient Instructions   Baseline: Hydration Physical activity High-fiber foods  Avoid medications that can cause constipation   Prescription "bowel prep" to really get things moving if needed: sent to pharmacy today.   Other days:   Daily: Bulk agents (e.g. Fiber, Psyllium, bran) Titrate to 20-30 grams per day Risk of bloating initially (requires adequate hydration)  As needed: (use at bedtime for AM stool) Polyethylene glycol (Miralax) 1 capful in 8 ounces at bedtime (preferred)  Consider adding Stimulant Laxatives: Senna or Cascara Bisacodyl  Prescriptions for chronic constipation: discuss w/ Dr Madilyn Fireman at follow-up  Amitiza (Lubiprostone) Linzess (Linaclotide)        Follow-up plan: Return for recheck w/ Dr Madilyn Fireman in a few days, seek emergency care sooner if needed .                                                 ################################################# ################################################# ################################################# #################################################    Current Meds  Medication Sig  . albuterol (PROVENTIL) (2.5 MG/3ML) 0.083% nebulizer solution Take 3 mLs (2.5 mg total) by nebulization every 4 (four) hours as needed for wheezing or shortness of breath.  . ALPRAZolam (XANAX) 0.5 MG tablet TAKE 0.5-1 TABLETS (0.25-5 MG TOTAL) BY MOUTH 2 (TWO) TIMES DAILY AS NEEDED.  Marland Kitchen amiodarone (PACERONE) 200 MG tablet Take 1 tablet (200 mg total) by mouth daily.  Marland Kitchen amLODipine (NORVASC) 5 MG tablet Take 1 tablet (5 mg total) by mouth daily. (Patient taking differently: Take 5 mg by mouth daily after supper. )  . Docusate Sodium (STOOL SOFTENER LAXATIVE PO) Take 1 capsule by mouth at bedtime as needed (constipation).   Marland Kitchen lisinopril (PRINIVIL,ZESTRIL) 20 MG tablet Take 1 tablet (20 mg total) by mouth daily.  . polyethylene glycol powder (GLYCOLAX/MIRALAX) powder Take 17 g by mouth daily. (Patient  taking differently: Take 17 g by mouth daily as needed (constipation). )  . traMADol (ULTRAM) 50 MG tablet Take 1 tablet (50 mg total) by mouth 2 (two) times daily as needed.  Alveda Reasons 20 MG TABS tablet TAKE 1 TABLET (20 MG TOTAL) BY MOUTH DAILY WITH SUPPER. (Patient taking differently: Take 20 mg by mouth daily after supper. High risk med: Anticoagulant.  Crushed Xarelto can be given down a G-tube but NOT a J-Tube.)    Allergies  Allergen Reactions  . Hyoscyamine Other (See Comments)    Mental status changes.  (as of 02/13/16 patient denies any problems with this medication)  . Rofecoxib Anaphylaxis and Hives  . Statins Other (See Comments)    Muscle aches  . Metoprolol Other (See Comments)    Extreme fatigue, weakness and brady       Review of Systems:  Constitutional: No recent illness  HEENT: No  headache, no vision change  Cardiac: No  chest pain, No  pressure, No palpitations  Respiratory:  No  shortness of breath. No  Cough  Gastrointestinal: No  abdominal pain, no change on bowel habits  Musculoskeletal: No new myalgia/arthralgia  Skin: No  Rash  Hem/Onc: No  easy bruising/bleeding, No  abnormal lumps/bumps  Neurologic: No  weakness, No  Dizziness  Psychiatric: No  concerns with depression, No  concerns with anxiety  Exam:  BP 115/71 (BP Location: Left Arm, Patient Position: Sitting, Cuff Size: Normal)   Pulse 90   Temp (!) 97.5 F (36.4 C) (Oral)   Wt 167 lb 6.4 oz (75.9 kg)   BMI 27.02 kg/m   Constitutional: VS see above. General Appearance: alert, well-developed, well-nourished, NAD  Eyes: Normal lids and conjunctive, non-icteric sclera  Ears, Nose, Mouth, Throat: MMM, Normal external inspection ears/nares/mouth/lips/gums.  Neck: No masses, trachea midline.   Respiratory: Normal respiratory effort. no wheeze, no rhonchi, no rales  Cardiovascular: S1/S2 normal, no murmur, no rub/gallop auscultated. RRR.   Musculoskeletal: Gait normal. Symmetric  and independent movement of all extremities  Abdominal: non-tender, non-distended, no appreciable organomegaly, neg Murphy's, BS WNLx4  Neurological: Normal balance/coordination. No tremor.  Skin: warm, dry, intact.   Psychiatric: Normal judgment/insight. Normal mood and affect. Oriented x3.       Visit summary with medication list and pertinent instructions was printed for patient to review, patient was advised to alert [place] if any updates are needed. All questions at time of visit were answered - patient instructed to contact office with any additional concerns. ER/RTC precautions were reviewed with the patient and understanding verbalized.      Please note: voice recognition software was used to produce this document, and typos may escape review. Please contact Dr. Sheppard Coil for any needed clarifications.    Follow up plan: Return for recheck w/ Dr Madilyn Fireman in a few days, seek emergency care sooner if needed .

## 2019-04-04 NOTE — Patient Instructions (Addendum)
Baseline: Hydration Physical activity High-fiber foods  Avoid medications that can cause constipation   Prescription "bowel prep" to really get things moving if needed: sent to pharmacy today.   Other days:   Daily: Bulk agents (e.g. Fiber, Psyllium, bran) Titrate to 20-30 grams per day Risk of bloating initially (requires adequate hydration)  As needed: (use at bedtime for AM stool) Polyethylene glycol (Miralax) 1 capful in 8 ounces at bedtime (preferred)  Consider adding Stimulant Laxatives: Senna or Cascara Bisacodyl  Prescriptions for chronic constipation: discuss w/ Dr Madilyn Fireman at follow-up  Amitiza (Lubiprostone) Linzess (Linaclotide)

## 2019-04-05 ENCOUNTER — Telehealth: Payer: Self-pay | Admitting: Family Medicine

## 2019-04-05 ENCOUNTER — Ambulatory Visit: Payer: Medicare HMO | Admitting: Family Medicine

## 2019-04-05 MED ORDER — HYDROCODONE-ACETAMINOPHEN 5-325 MG PO TABS: 1.0000 | ORAL_TABLET | Freq: Four times a day (QID) | ORAL | 0 refills | Status: DC | PRN

## 2019-04-05 NOTE — Telephone Encounter (Signed)
Pt advised. She will contact GI to be seen.   She was last seen at: [facility] [place] [phone]

## 2019-04-05 NOTE — Telephone Encounter (Signed)
The problem is is that anything stronger is just got a constipate her which is part of the problem.  Did she try the bowel prep that Dr. Sheppard Coil had written for?  She may also need to try a enema to try to get things moving along with the magnesium citrate.  The biggest thing that will give her pain relief is to get the stool out.

## 2019-04-05 NOTE — Telephone Encounter (Signed)
Sent prescription for small quantity of hydrocodone to use sparingly.  We really need to consider referring her to GI if she is having that much pain.  We can try to get her in.  Not sure who see who she saw last.

## 2019-04-05 NOTE — Telephone Encounter (Signed)
Pt reports she has taken the laxative, miralax, and eating prunes. Pt states she drank mag citrate last night. She has not used an enema because she has had a BM yesterday and today.  Still requesting something for pain.

## 2019-04-05 NOTE — Telephone Encounter (Signed)
Pt called clinic today requesting something for her abdominal pain. She was evaluated yesterday by Dr Sheppard Coil. She has been taking tylenol, but states it is not helping. She did have BM yesterday, did not help the pain. States Tramadol constipates her more. States she "has to have something" of she will "just go to the hospital."   Routing to PCP.

## 2019-04-06 LAB — URINE CULTURE
MICRO NUMBER:: 447012
SPECIMEN QUALITY:: ADEQUATE

## 2019-04-06 LAB — URINALYSIS, MICROSCOPIC ONLY
Bacteria, UA: NONE SEEN /HPF
Hyaline Cast: NONE SEEN /LPF
WBC, UA: NONE SEEN /HPF (ref 0–5)

## 2019-04-07 LAB — COMPLETE METABOLIC PANEL WITH GFR
AG Ratio: 1.6 (calc) (ref 1.0–2.5)
ALT: 21 U/L (ref 6–29)
AST: 21 U/L (ref 10–35)
Albumin: 4.1 g/dL (ref 3.6–5.1)
Alkaline phosphatase (APISO): 86 U/L (ref 37–153)
BUN: 12 mg/dL (ref 7–25)
CO2: 25 mmol/L (ref 20–32)
Calcium: 9.4 mg/dL (ref 8.6–10.4)
Chloride: 96 mmol/L — ABNORMAL LOW (ref 98–110)
Creat: 0.84 mg/dL (ref 0.60–0.88)
GFR, Est African American: 76 mL/min/{1.73_m2} (ref >=60)
GFR, Est Non African American: 65 mL/min/{1.73_m2} (ref >=60)
Globulin: 2.5 g/dL (calc) (ref 1.9–3.7)
Glucose, Bld: 109 mg/dL — ABNORMAL HIGH (ref 65–99)
Potassium: 4.5 mmol/L (ref 3.5–5.3)
Sodium: 132 mmol/L — ABNORMAL LOW (ref 135–146)
Total Bilirubin: 0.6 mg/dL (ref 0.2–1.2)
Total Protein: 6.6 g/dL (ref 6.1–8.1)

## 2019-04-07 LAB — CBC
HCT: 40.8 % (ref 35.0–45.0)
Hemoglobin: 13.9 g/dL (ref 11.7–15.5)
MCH: 29.6 pg (ref 27.0–33.0)
MCHC: 34.1 g/dL (ref 32.0–36.0)
MCV: 86.8 fL (ref 80.0–100.0)
MPV: 10.5 fL (ref 7.5–12.5)
Platelets: 317 10*3/uL (ref 140–400)
RBC: 4.7 10*6/uL (ref 3.80–5.10)
RDW: 12.4 % (ref 11.0–15.0)
WBC: 6.9 10*3/uL (ref 3.8–10.8)

## 2019-04-07 LAB — OSMOLALITY: Osmolality: 278 mOsm/kg (ref 278–305)

## 2019-04-07 LAB — T4, FREE: Free T4: 1.4 ng/dL (ref 0.8–1.8)

## 2019-04-07 LAB — TSH: TSH: 2.86 mIU/L (ref 0.40–4.50)

## 2019-04-10 ENCOUNTER — Telehealth: Payer: Self-pay

## 2019-04-10 ENCOUNTER — Other Ambulatory Visit: Payer: Self-pay | Admitting: Family Medicine

## 2019-04-10 DIAGNOSIS — F411 Generalized anxiety disorder: Secondary | ICD-10-CM

## 2019-04-10 MED ORDER — ALPRAZOLAM 0.25 MG PO TABS: 0.2500 mg | ORAL_TABLET | Freq: Two times a day (BID) | ORAL | 1 refills | Status: DC | PRN

## 2019-04-10 NOTE — Telephone Encounter (Signed)
Patient called requesting RF on Xanax.   Last RX sent 03/03/19 for #60, 0.5-1 tab PO BID PRN   Patient states she usually can only take 1/2 tab and be fine, but the pills were too small to split in this last fill, so she had to take a whole tablet. Pt also notes she is aware not to take Xanax with pain medication, she has only had to take 2 pain pills so far.  RX pended, please advise.

## 2019-04-10 NOTE — Telephone Encounter (Signed)
Okay, I will send over new prescription for the 0.25's that way she does not have to split them and she can take 1 in the morning and 1 in the evening.  Can just remind her to please use sparingly.

## 2019-04-10 NOTE — Telephone Encounter (Signed)
Pt advised. Understands to use sparingly.

## 2019-04-12 ENCOUNTER — Encounter: Payer: Self-pay | Admitting: Family Medicine

## 2019-04-12 ENCOUNTER — Ambulatory Visit (INDEPENDENT_AMBULATORY_CARE_PROVIDER_SITE_OTHER): Payer: Medicare HMO | Admitting: Family Medicine

## 2019-04-12 ENCOUNTER — Other Ambulatory Visit: Payer: Self-pay

## 2019-04-12 ENCOUNTER — Ambulatory Visit (INDEPENDENT_AMBULATORY_CARE_PROVIDER_SITE_OTHER): Payer: Medicare HMO

## 2019-04-12 VITALS — BP 148/83 | HR 80 | Temp 97.5°F | Ht 66.0 in | Wt 169.0 lb

## 2019-04-12 DIAGNOSIS — M25551 Pain in right hip: Secondary | ICD-10-CM

## 2019-04-12 DIAGNOSIS — I714 Abdominal aortic aneurysm, without rupture, unspecified: Secondary | ICD-10-CM

## 2019-04-12 DIAGNOSIS — L219 Seborrheic dermatitis, unspecified: Secondary | ICD-10-CM

## 2019-04-12 DIAGNOSIS — R748 Abnormal levels of other serum enzymes: Secondary | ICD-10-CM | POA: Diagnosis not present

## 2019-04-12 DIAGNOSIS — K9089 Other intestinal malabsorption: Secondary | ICD-10-CM | POA: Diagnosis not present

## 2019-04-12 DIAGNOSIS — R790 Abnormal level of blood mineral: Secondary | ICD-10-CM | POA: Diagnosis not present

## 2019-04-12 DIAGNOSIS — G8929 Other chronic pain: Secondary | ICD-10-CM

## 2019-04-12 DIAGNOSIS — L659 Nonscarring hair loss, unspecified: Secondary | ICD-10-CM | POA: Diagnosis not present

## 2019-04-12 DIAGNOSIS — M160 Bilateral primary osteoarthritis of hip: Secondary | ICD-10-CM | POA: Diagnosis not present

## 2019-04-12 DIAGNOSIS — G609 Hereditary and idiopathic neuropathy, unspecified: Secondary | ICD-10-CM | POA: Diagnosis not present

## 2019-04-12 DIAGNOSIS — R7989 Other specified abnormal findings of blood chemistry: Secondary | ICD-10-CM | POA: Diagnosis not present

## 2019-04-12 DIAGNOSIS — E049 Nontoxic goiter, unspecified: Secondary | ICD-10-CM | POA: Diagnosis not present

## 2019-04-12 DIAGNOSIS — R1031 Right lower quadrant pain: Secondary | ICD-10-CM | POA: Diagnosis not present

## 2019-04-12 MED ORDER — BETAMETHASONE VALERATE 0.12 % EX FOAM: 1.0000 "application " | Freq: Two times a day (BID) | CUTANEOUS | 2 refills | Status: DC

## 2019-04-12 MED ORDER — KETOCONAZOLE 2 % EX SHAM: 1.0000 "application " | MEDICATED_SHAMPOO | CUTANEOUS | 0 refills | Status: DC

## 2019-04-12 NOTE — Progress Notes (Signed)
Acute Office Visit  Subjective:    Patient ID: Laurie [name], female    DOB: Jun 22, 1935, 84 y.o.   MRN: [id number]  Chief Complaint  Patient presents with  . Alopecia    x 2 mos she reports that she has had scaly stuff on her scalp so sh has been using head and shoulders shampoo for this which has helped some however the areas have gotten larger and she has noticed that more of her hair has come out.     HPI Patient is in today for Hair loss. x 2 mos..  She says it is coming out in handfuls and she is not sure why.  She were also reports a scaly rash on her scalp.  She said she noticed it really about a year ago but it was more mild and she just charted using some head and shoulder shampoo which actually seemed to help.  But more recently it has become more severe than it was a year ago.  She has tried using the head and shoulders shampoo this time but it does not seem to be helping.  She feels like the area that is been affected is larger.   She  also complains of right lower abdominal pain but when she points to the area is actually really over the groin crease.  She says it can radiate into the buttock area at times and even to her rectal area.  She is been complaining of abdominal pain for a couple of months now.  In fact she had a CT of the abdomen performed at the emergency department on February 18.  Did not show any specific cause for her pain but did show a 3.9 cm abdominal aortic aneurysm which needs to be followed up in 2 years with ultrasound.  Past Medical History:  Diagnosis Date  . AAA (abdominal aortic aneurysm) ([place])   . Anemia    iron deficiency  . Ascending aorta dilatation (HCC)   . Atrial fibrillation ([place])    post op  . CAD (coronary artery disease)   . Carcinoma of lung ([place]) 08/28/2008   T2N0 moderately diff. squamous cell CA  . Cerebrovascular disease   . COPD (chronic obstructive pulmonary disease) ([place]) 2005   on CXR with R apical scaring  .  Headache(784.0)    Hx: of migraines in the past  . Hypercholesterolemia    Hx: of  . Hypertension   . Incidental pulmonary nodule, > 64mm and < 15mm    LLL  . S/P aortic valve replacement 08/28/2008   [facility]  . S/P lobectomy of lung 08/28/2008   rt middle lobe  dr. Roxy Manns  . Shingles 11-09    Past Surgical History:  Procedure Laterality Date  . ABDOMINAL SURGERY    . AORTIC VALVE REPLACEMENT  08/28/2008   #35mm [facility] Ease pericardial tissue valve  . APPENDECTOMY    . CARDIAC CATHETERIZATION    . CATARACT EXTRACTION W/ INTRAOCULAR LENS  IMPLANT, BILATERAL Bilateral   . CHOLECYSTECTOMY N/A 12/13/2013   Procedure: LAPAROSCOPIC CHOLECYSTECTOMY;  Surgeon: Ralene Ok, MD;  Location: [place];  Service: General;  Laterality: N/A;  . COLONOSCOPY     \  . DILATION AND CURETTAGE OF UTERUS    . ERCP N/A 05/08/2015   Procedure: ENDOSCOPIC RETROGRADE CHOLANGIOPANCREATOGRAPHY (ERCP);  Surgeon: Inda Castle, MD;  Location: [place];  Service: Endoscopy;  Laterality: N/A;  . ERCP N/A 01/13/2016   Procedure: ENDOSCOPIC RETROGRADE CHOLANGIOPANCREATOGRAPHY (ERCP);  Surgeon: Doran Stabler, MD;  Location: [facility];  Service: Endoscopy;  Laterality: N/A;  . ERCP N/A 03/09/2016   Procedure: ENDOSCOPIC RETROGRADE CHOLANGIOPANCREATOGRAPHY (ERCP);  Surgeon: Ladene Artist, MD;  Location: [facility];  Service: Endoscopy;  Laterality: N/A;  . PACEMAKER IMPLANT N/A 12/05/2018   Procedure: PACEMAKER IMPLANT;  Surgeon: Evans Lance, MD;  Location: [place] [facility];  Service: Cardiovascular;  Laterality: N/A;  . RML removed Dr Ricard Dillon for lung cancer  08/28/2008   T2N0 squamous cell CA  . TONSILLECTOMY AND ADENOIDECTOMY    . u/s guided aspiration of R breast cyst   2009   at the breast clinic    Family History  Problem Relation Age of Onset  . Stroke Father   . Diabetes Son        brittle diabetes  . Colon polyps Neg Hx   . Colon cancer Neg Hx   . Esophageal cancer Neg Hx   .  Stomach cancer Neg Hx   . Pancreatic cancer Neg Hx   . Kidney disease Neg Hx   . Liver disease Neg Hx     Social History   Socioeconomic History  . Marital status: Single    Spouse name: Not on file  . Number of children: Not on file  . Years of education: Not on file  . Highest education level: Not on file  Occupational History  . Not on file  Social Needs  . Financial resource strain: Not on file  . Food insecurity:    Worry: Not on file    Inability: Not on file  . Transportation needs:    Medical: Not on file    Non-medical: Not on file  Tobacco Use  . Smoking status: Former Smoker    Years: 40.00  . Smokeless tobacco: Never Used  Substance and Sexual Activity  . Alcohol use: No    Alcohol/week: 0.0 standard drinks  . Drug use: No  . Sexual activity: Not on file  Lifestyle  . Physical activity:    Days per week: Not on file    Minutes per session: Not on file  . Stress: Not on file  Relationships  . Social connections:    Talks on phone: Not on file    Gets together: Not on file    Attends religious service: Not on file    Active member of club or organization: Not on file    Attends meetings of clubs or organizations: Not on file    Relationship status: Not on file  . Intimate partner violence:    Fear of current or ex partner: Not on file    Emotionally abused: Not on file    Physically abused: Not on file    Forced sexual activity: Not on file  Other Topics Concern  . Not on file  Social History Narrative  . Not on file    Outpatient Medications Prior to Visit  Medication Sig Dispense Refill  . acetaminophen (TYLENOL) 500 MG tablet Take 2 tablets (1,000 mg total) by mouth every 8 (eight) hours as needed (pain). 30 tablet 0  . albuterol (PROVENTIL) (2.5 MG/3ML) 0.083% nebulizer solution Take 3 mLs (2.5 mg total) by nebulization every 4 (four) hours as needed for wheezing or shortness of breath. 30 vial 0  . ALPRAZolam (XANAX) 0.25 MG tablet Take 1  tablet (0.25 mg total) by mouth 2 (two) times daily as needed for anxiety. TAKE 0.5-1 TABLETS (0.25-5 MG TOTAL) BY MOUTH  2 (TWO) TIMES DAILY AS NEEDED. 60 tablet 1  . amiodarone (PACERONE) 200 MG tablet Take 1 tablet (200 mg total) by mouth daily. 90 tablet 3  . amLODipine (NORVASC) 5 MG tablet Take 1 tablet (5 mg total) by mouth daily. (Patient taking differently: Take 5 mg by mouth daily after supper. ) 90 tablet 3  . Docusate Sodium (STOOL SOFTENER LAXATIVE PO) Take 1 capsule by mouth at bedtime as needed (constipation).     Marland Kitchen lisinopril (PRINIVIL,ZESTRIL) 20 MG tablet Take 1 tablet (20 mg total) by mouth daily. 90 tablet 3  . magnesium citrate SOLN 150 mL 1 - 2 times po daily prn severe constipaiton 500 mL 0  . polyethylene glycol powder (GLYCOLAX/MIRALAX) powder Take 17 g by mouth daily. (Patient taking differently: Take 17 g by mouth daily as needed (constipation). ) 250 g 2  . XARELTO 20 MG TABS tablet TAKE 1 TABLET (20 MG TOTAL) BY MOUTH DAILY WITH SUPPER. (Patient taking differently: Take 20 mg by mouth daily after supper. High risk med: Anticoagulant.  Crushed Xarelto can be given down a G-tube but NOT a J-Tube.) 90 tablet 1  . HYDROcodone-acetaminophen (NORCO/VICODIN) 5-325 MG tablet Take 1 tablet by mouth every 6 (six) hours as needed for severe pain. 15 tablet 0  . traMADol (ULTRAM) 50 MG tablet Take 1 tablet (50 mg total) by mouth 2 (two) times daily as needed. 60 tablet 0   No facility-administered medications prior to visit.     Allergies  Allergen Reactions  . Hyoscyamine Other (See Comments)    Mental status changes.  (as of 02/13/16 patient denies any problems with this medication)  . Rofecoxib Anaphylaxis and Hives  . Statins Other (See Comments)    Muscle aches  . Metoprolol Other (See Comments)    Extreme fatigue, weakness and brady    ROS     Objective:    Physical Exam  BP (!) 148/83   Pulse 80   Temp (!) 97.5 F (36.4 C)   Ht 5\' 6"  (1.676 m)   Wt 169 lb  (76.7 kg)   SpO2 96%   BMI 27.28 kg/m  Wt Readings from Last 3 Encounters:  04/12/19 169 lb (76.7 kg)  04/04/19 167 lb 6.4 oz (75.9 kg)  03/03/19 166 lb (75.3 kg)    Health Maintenance Due  Topic Date Due  . PNA vac Low Risk Adult (2 of 2 - PCV13) 07/31/2009    There are no preventive care reminders to display for this patient.   Lab Results  Component Value Date   TSH 2.86 04/04/2019   Lab Results  Component Value Date   WBC 6.9 04/04/2019   HGB 13.9 04/04/2019   HCT 40.8 04/04/2019   MCV 86.8 04/04/2019   PLT 317 04/04/2019   Lab Results  Component Value Date   NA 132 (L) 04/04/2019   K 4.5 04/04/2019   CO2 25 04/04/2019   GLUCOSE 109 (H) 04/04/2019   BUN 12 04/04/2019   CREATININE 0.84 04/04/2019   BILITOT 0.6 04/04/2019   ALKPHOS 92 01/17/2019   AST 21 04/04/2019   ALT 21 04/04/2019   PROT 6.6 04/04/2019   ALBUMIN 3.8 01/17/2019   CALCIUM 9.4 04/04/2019   ANIONGAP 9 02/07/2019   GFR 87.46 04/25/2015   Lab Results  Component Value Date   CHOL 291 (H) 09/18/2010   Lab Results  Component Value Date   HDL 38 (L) 09/18/2010   Lab Results  Component Value Date  LDLCALC 191 (H) 09/18/2010   Lab Results  Component Value Date   TRIG 308 (H) 09/18/2010   Lab Results  Component Value Date   CHOLHDL 7.7 Ratio 09/18/2010   Lab Results  Component Value Date   HGBA1C 6.2 (H) 04/18/2015       Assessment & Plan:   Problem List Items Addressed This Visit      Cardiovascular and Mediastinum   Abdominal aortic aneurysm (AAA) 3.0 cm to 5.0 cm in diameter in female [facility])    CT 12/2018 shows 3.9 cm, recommend repeast [place] in about 2 years.        Other Visit Diagnoses    Right hip pain    -  Primary   Relevant Orders   DG HIP UNILAT WITH PELVIS 2-3 VIEWS RIGHT   Chronic RLQ pain       Seborrheic dermatitis       Relevant Medications   Betamethasone Valerate 0.12 % foam   ketoconazole (NIZORAL) 2 % shampoo (Start on 04/13/2019)   Other Relevant  Orders   Ferritin   Folate   Magnesium   Zinc   Copper, serum   Vitamin B6   Vitamin B1   Hair loss       Relevant Orders   Ferritin   Folate   Magnesium   Zinc   Copper, serum   Vitamin B6   Vitamin B1     Seborrheic dermatitis-the rash on her scalp is most consistent with seborrheic dermatitis.  Will treat with ketoconazole shampoo.  She says she washes her hair about every 3 days and then we will have her try topical steroid foam and/or liquid.  I did tell her that if the foam is quite expensive do please let me know and have the pharmacist call me and we will switch it to a liquid.  Hair loss-it may just be that she is losing hair where the seborrheic dermatitis is really inflamed but we will also check for deficiencies which could be contributing as well.  We actually just checked her thyroid about a month ago and it looked fantastic.  Right hip pain-she is really complaining of abdominal pain but when she points the area it is really over the right hip.  She did not have significant pain or tenderness with external rotation but had more discomfort with internal rotation but says it was because it was really pressing on her pubic bone.  I like to start with an x-ray since she already had a CT scan done about 3 months ago that was negative.  Will call with results once available.   Meds ordered this encounter  Medications  . Betamethasone Valerate 0.12 % foam    Sig: Apply 1 application topically 2 (two) times daily.    Dispense:  100 g    Refill:  2  . ketoconazole (NIZORAL) 2 % shampoo    Sig: Apply 1 application topically 2 (two) times a week.    Dispense:  120 mL    Refill:  0     Beatrice Lecher, MD

## 2019-04-12 NOTE — Assessment & Plan Note (Signed)
CT 12/2018 shows 3.9 cm, recommend repeast [place] in about 2 years.

## 2019-04-13 ENCOUNTER — Telehealth: Payer: Self-pay | Admitting: Cardiology

## 2019-04-13 MED ORDER — CLOBETASOL PROPIONATE 0.05 % EX SOLN: 1.0000 "application " | Freq: Every day | CUTANEOUS | 0 refills | Status: DC

## 2019-04-13 NOTE — Telephone Encounter (Signed)
New Message   Vicente Males from med center calling for prior authorization on patient.

## 2019-04-13 NOTE — Telephone Encounter (Signed)
Spoke with Vicente Males, patients prior auth for echo has expired and need to be extended. Will forward to the pre-cert department.

## 2019-04-13 NOTE — Progress Notes (Unsigned)
Ok, new steroid solution sent to pharmacy since foam was too expensive.

## 2019-04-14 ENCOUNTER — Other Ambulatory Visit (HOSPITAL_BASED_OUTPATIENT_CLINIC_OR_DEPARTMENT_OTHER): Payer: Medicare HMO

## 2019-04-16 LAB — MAGNESIUM: Magnesium: 2.2 mg/dL (ref 1.5–2.5)

## 2019-04-16 LAB — COPPER, SERUM: Copper: 177 ug/dL — ABNORMAL HIGH (ref 70–175)

## 2019-04-16 LAB — VITAMIN B1: Vitamin B1 (Thiamine): 24 nmol/L (ref 8–30)

## 2019-04-16 LAB — ZINC: Zinc: 72 ug/dL (ref 60–130)

## 2019-04-16 LAB — FERRITIN: Ferritin: 41 ng/mL (ref 16–288)

## 2019-04-16 LAB — FOLATE: Folate: 23.4 ng/mL

## 2019-04-16 LAB — VITAMIN B6: Vitamin B6: 26.7 ng/mL — ABNORMAL HIGH (ref 2.1–21.7)

## 2019-04-17 ENCOUNTER — Ambulatory Visit: Payer: Medicare HMO | Admitting: Family Medicine

## 2019-04-18 ENCOUNTER — Telehealth: Payer: Self-pay | Admitting: *Deleted

## 2019-04-18 ENCOUNTER — Telehealth: Payer: Self-pay | Admitting: Gastroenterology

## 2019-04-18 NOTE — Telephone Encounter (Signed)
Patty with [name] called and left a message stating they would not be able to get Delpha in sooner.    [phone]

## 2019-04-18 NOTE — Telephone Encounter (Signed)
Tonya at PCP office calling on behalf of this pt, she stated that pt is not feeling well and pain has worsened, she is requesting a telemedicine visit sooner than 5/28 (pt's appt). Pls call PCP office and ask to speak with triage nurse as Kenney Houseman is working from home today.

## 2019-04-18 NOTE — Telephone Encounter (Signed)
Left message on machine to call back  

## 2019-04-18 NOTE — Telephone Encounter (Signed)
Left message about pt not feeling any better and still in a lot of pain and wanted to know if there was anyway possible to get her worked in before her visit on the 28th? Was told that he is booked and that a message will be sent to his nurse to see if something can be worked out for her.   They will call the office back  And speak w/someone in Triage concerning this .Marland KitchenElouise Munroe, [place]

## 2019-04-18 NOTE — Telephone Encounter (Signed)
Called and advised pt of this.Maryruth Eve, Lahoma Crocker, CMA

## 2019-04-19 ENCOUNTER — Ambulatory Visit (HOSPITAL_BASED_OUTPATIENT_CLINIC_OR_DEPARTMENT_OTHER)
Admission: RE | Admit: 2019-04-19 | Discharge: 2019-04-19 | Disposition: A | Discharge status: Home / Self Care | Payer: Medicare HMO | Source: Ambulatory Visit | Attending: Cardiology | Admitting: Cardiology

## 2019-04-19 ENCOUNTER — Other Ambulatory Visit: Payer: Self-pay

## 2019-04-19 DIAGNOSIS — I359 Nonrheumatic aortic valve disorder, unspecified: Secondary | ICD-10-CM | POA: Diagnosis not present

## 2019-04-19 MED ORDER — PERFLUTREN LIPID MICROSPHERE
1.0000 mL | INTRAVENOUS | Status: AC | PRN
  Administered 2019-04-19: 16:00:00 4 mL via INTRAVENOUS
  Filled 2019-04-19: qty 10

## 2019-04-19 NOTE — Progress Notes (Signed)
  Echocardiogram 2D Echocardiogram has been performed.   Laurie [name] 04/19/2019, 2:40 PM

## 2019-04-21 ENCOUNTER — Telehealth: Payer: Self-pay | Admitting: Cardiology

## 2019-04-21 NOTE — Telephone Encounter (Signed)
Patient would like to speak to nurse about a medication she is taking before she gets it refilled again. amiodarone (PACERONE) 200 MG tablet.  She said she was reading the instructions that came with it and is states you should not be taking if you have a pace maker and she has a  Psychologist, forensic

## 2019-04-21 NOTE — Telephone Encounter (Signed)
Spoke with pt, reassurance given to the patient okay to take amiodarone with a pacemaker.

## 2019-04-21 NOTE — Telephone Encounter (Signed)
Spoke with pt who state she read on amiodarone instructions, that you should not take medication if you have a pacemaker. Pt calling because she take she is concerned and would like input of MD before she receive a new Rx. Will route message.

## 2019-04-26 NOTE — Telephone Encounter (Signed)
Pt keeping appt as scheduled.

## 2019-04-27 ENCOUNTER — Other Ambulatory Visit: Payer: Self-pay

## 2019-04-27 ENCOUNTER — Encounter: Payer: Self-pay | Admitting: Gastroenterology

## 2019-04-27 ENCOUNTER — Ambulatory Visit (INDEPENDENT_AMBULATORY_CARE_PROVIDER_SITE_OTHER): Payer: Medicare HMO | Admitting: Gastroenterology

## 2019-04-27 DIAGNOSIS — K5909 Other constipation: Secondary | ICD-10-CM | POA: Diagnosis not present

## 2019-04-27 DIAGNOSIS — R1031 Right lower quadrant pain: Secondary | ICD-10-CM

## 2019-04-27 NOTE — Progress Notes (Signed)
This patient contacted our office requesting a physician telemedicine consultation regarding clinical questions and/or test results. Interactive audio and video telecommunications were attempted between this provider and the patient.  However, this technology failed due to the patient having technical difficulties OR they did not have access to video capabilities.  We continued and completed the visit with audio only.  Referred back to me by Dr. Madilyn Fireman (PCP)  Participants on the conference : myself and patient   The patient consented to this consultation and was aware that a charge will be placed through their insurance.  I was in my office and the patient was at home   Encounter time:  Total time 22 minutes , all spent on phone with patient   _____________________________________________________________________________________________               [name] Progress Note  Chief Complaint: RLQ pain  Subjective  History: Last clinic visit September 2019 for chronic right-sided abdominal pain with a history of cholelithiasis and cholangitis but also chronic musculoskeletal pain. Recent primary care appointment for right hip and groin pain.  Ongoing right sided pain as before for years.  Difficult and tangential historian, but sometimes worse if no BM for 2 days.  Often worse with movement or laying on right side.  No rectal bleeding.  ROS: Cardiovascular:  no chest pain Respiratory: no dyspnea Anxiety  The patient's Past Medical, Family and Social History were reviewed and are on file in the EMR. Paroxysmal atrial fibrillation on [place], most recently evaluated by echocardiogram on 04/19/2019. Had pacemaker January 2020.  Objective:  Med list reviewed  Current Outpatient Medications:  .  acetaminophen (TYLENOL) 500 MG tablet, Take 2 tablets (1,000 mg total) by mouth every 8 (eight) hours as needed (pain)., Disp: 30 tablet, Rfl: 0 .  albuterol (PROVENTIL) (2.5  MG/3ML) 0.083% nebulizer solution, Take 3 mLs (2.5 mg total) by nebulization every 4 (four) hours as needed for wheezing or shortness of breath., Disp: 30 vial, Rfl: 0 .  ALPRAZolam (XANAX) 0.25 MG tablet, Take 1 tablet (0.25 mg total) by mouth 2 (two) times daily as needed for anxiety. TAKE 0.5-1 TABLETS (0.25-5 MG TOTAL) BY MOUTH 2 (TWO) TIMES DAILY AS NEEDED., Disp: 60 tablet, Rfl: 1 .  amiodarone (PACERONE) 200 MG tablet, Take 1 tablet (200 mg total) by mouth daily., Disp: 90 tablet, Rfl: 3 .  amLODipine (NORVASC) 5 MG tablet, Take 1 tablet (5 mg total) by mouth daily. (Patient taking differently: Take 5 mg by mouth daily after supper. ), Disp: 90 tablet, Rfl: 3 .  Betamethasone Valerate 0.12 % foam, Apply 1 application topically 2 (two) times daily., Disp: 100 g, Rfl: 2 .  clobetasol (TEMOVATE) 0.05 % external solution, Apply 1 application topically at bedtime. To scalp, Disp: 50 mL, Rfl: 0 .  Docusate Sodium (STOOL SOFTENER LAXATIVE PO), Take 1 capsule by mouth at bedtime as needed (constipation). , Disp: , Rfl:  .  ketoconazole (NIZORAL) 2 % shampoo, Apply 1 application topically 2 (two) times a week., Disp: 120 mL, Rfl: 0 .  lisinopril (PRINIVIL,ZESTRIL) 20 MG tablet, Take 1 tablet (20 mg total) by mouth daily., Disp: 90 tablet, Rfl: 3 .  magnesium citrate SOLN, 150 mL 1 - 2 times po daily prn severe constipaiton, Disp: 500 mL, Rfl: 0 .  polyethylene glycol powder (GLYCOLAX/MIRALAX) powder, Take 17 g by mouth daily. (Patient taking differently: Take 17 g by mouth daily as needed (constipation). ), Disp: 250 g, Rfl: 2 .  Alveda Reasons  20 MG TABS tablet, TAKE 1 TABLET (20 MG TOTAL) BY MOUTH DAILY WITH SUPPER. (Patient taking differently: Take 20 mg by mouth daily after supper. High risk med: Anticoagulant.  Crushed Xarelto can be given down a G-tube but NOT a J-Tube.), Disp: 90 tablet, Rfl: 1    No exam - virtual visit  Recent Labs:  CMP Latest Ref Rng & Units 04/04/2019 02/07/2019 01/17/2019   Glucose 65 - 99 mg/dL 109(H) 104(H) 105(H)  BUN 7 - 25 mg/dL 12 8 9   Creatinine 0.60 - 0.88 mg/dL 0.84 0.86 1.11(H)  Sodium 135 - 146 mmol/L 132(L) 130(L) 127(L)  Potassium 3.5 - 5.3 mmol/L 4.5 4.0 5.1  Chloride 98 - 110 mmol/L 96(L) 94(L) 91(L)  CO2 20 - 32 mmol/L 25 27 25   Calcium 8.6 - 10.4 mg/dL 9.4 9.1 9.4  Total Protein 6.1 - 8.1 g/dL 6.6 - 6.7  Total Bilirubin 0.2 - 1.2 mg/dL 0.6 - 1.0  Alkaline Phos 38 - 126 U/L - - 92  AST 10 - 35 U/L 21 - 28  ALT 6 - 29 U/L 21 - 24   CBC Latest Ref Rng & Units 04/04/2019 02/07/2019 01/17/2019  WBC 3.8 - 10.8 Thousand/uL 6.9 9.2 7.1  Hemoglobin 11.7 - 15.5 g/dL 13.9 13.8 13.8  Hematocrit 35.0 - 45.0 % 40.8 43.4 42.6  Platelets 140 - 400 Thousand/uL 317 230 240     Radiologic studies:  CLINICAL DATA:  Generalized abdominal pain   EXAM: CT ABDOMEN AND PELVIS WITH CONTRAST   TECHNIQUE: Multidetector CT imaging of the abdomen and pelvis was performed using the standard protocol following bolus administration of intravenous contrast.   CONTRAST:  173mL OMNIPAQUE IOHEXOL 300 MG/ML  SOLN   COMPARISON:  CT 06/24/2017, 09/19/2016   FINDINGS: Lower chest: Lung bases demonstrate partially visualized cardiac pacing leads. No acute consolidation or effusion. Mild scarring at the right base. Similar aneurysmal dilatation of distal descending thoracic aorta measuring up to 4 cm.   Hepatobiliary: Pneumobilia. Dilated bile ducts containing air and fluid, similar as compared with most recent prior CT.   Pancreas: Prominent pancreatic duct as before. No inflammatory change   Spleen: Normal in size without focal abnormality.   Adrenals/Urinary Tract: Stable 2 cm left adrenal gland nodule. Right adrenal gland is normal. Kidneys are normal, without renal calculi, focal lesion, or hydronephrosis. Bladder is unremarkable.   Stomach/Bowel: Stomach is within normal limits. Status post appendectomy. Large volume of stool in the colon. Sigmoid  colon diverticula without acute inflammatory change no evidence of bowel wall thickening, distention, or inflammatory changes.   Vascular/Lymphatic: Aneurysmal dilatation of the proximal infrarenal abdominal aorta up to 3.9 cm in change. Moderate aortic atherosclerosis. No significantly enlarged lymph nodes   Reproductive: Uterus and bilateral adnexa are unremarkable.   Other: Negative for free air or free fluid.   Musculoskeletal: Degenerative changes without acute or suspicious abnormality.   IMPRESSION: 1. No CT evidence for acute intra-abdominal or pelvic abnormality. 2. Stable pneumobilia and biliary dilatation, likely due to prior intervention 3. Stable 2 cm left adrenal nodule consistent with an adenoma 4. 3.9 cm abdominal aortic aneurysm. Recommend followup by ultrasound in 2 years. This recommendation follows ACR consensus guidelines: White Paper of the ACR Incidental Findings Committee II on Vascular Findings. J Am Coll Radiol 2013; 10:789-794. Aortic aneurysm NOS (ICD10-I71.9) 5. Large volume of stool in the colon, query constipation     Electronically Signed   By: Donavan Foil M.D.   On: 01/17/2019 18:47  ____________________________________________________________   CLINICAL DATA:  Lower abdominal pain for weeks.   EXAM: ABDOMEN - 2 VIEW   COMPARISON:  CT abdomen and pelvis 01/17/2019.   FINDINGS: The bowel gas pattern is normal. Moderate to moderately large stool burden. There is no evidence of free air. No radio-opaque calculi or other significant radiographic abnormality is seen.   IMPRESSION: No acute finding.   Moderate to moderately large stool burden.     Electronically Signed   By: Inge Rise M.D.   On: 04/04/2019 13:07   @ASSESSMENTPLANBEGIN @ Assessment: Encounter Diagnoses  Name Primary?  . RLQ abdominal pain Yes  . Chronic constipation     Unclear how much from constipation, still seems largely musculoskeletal to me.  She has had a chronic pain syndrome for many years, including abdominal and groin/pelvic pain.  We can most likely relieve the constipation, though medicine choices are limited due to her age and comorbidities.  Even with that, she is still likely to have ongoing pain.  Would have to defer to primary care whether any treatment for suspected hip arthritis is warranted.  Plan: Increase miralax to twice daily for 2 or 3 days if she is constipated.  Make sure she always takes at least once a day.    Laurie [name]

## 2019-05-01 ENCOUNTER — Telehealth: Payer: Self-pay

## 2019-05-01 DIAGNOSIS — R21 Rash and other nonspecific skin eruption: Secondary | ICD-10-CM

## 2019-05-01 NOTE — Telephone Encounter (Signed)
Patient advised. States she is still sore but not actively having the pain. Will call back if pain starts again. OK for referral to derm for scalp.   Patient also states she has been so stressed out lately, she wants OK for early refill of Xanax cause she is having to take more. Patient expresses being sad more,e specially since her son died a year ago..  Encouraged patient to schedule phone call visit with Dr Madilyn Fireman tomorrow, pt is agreeable. On scheduled for tomorrow AM

## 2019-05-01 NOTE — Telephone Encounter (Signed)
Patient called stating she was given Clobetasol solution for seborrheic dermatitis and she has been using it nightly for just under two weeks and also using Ketoconazole twice a week.   Patient reports that scaliness is not improving and last night she had an episode of pain and soreness on the scalp that runs up from back of head into temple. States she used a heating pad and also some ice packs, no help. Ended up taking a Tylenol just sos he could go to sleep because pain was so bad. Still extremely sore to the touch. Does note she can feel some new bumps that are sensitive in the hairline/scalp.   Patient holding solution and shampoo until she hears back on what to do next from Dr Madilyn Fireman

## 2019-05-01 NOTE — Telephone Encounter (Signed)
A, I would hold off on topicals for now.  We can refer her to dermatology for the rash since it does not seem to be responding.  If she continues to have pain in the back of her head running up to her temple then please let me know.  Especially if it seems to be one-sided.

## 2019-05-01 NOTE — Telephone Encounter (Signed)
Derm referral placed

## 2019-05-02 ENCOUNTER — Ambulatory Visit (INDEPENDENT_AMBULATORY_CARE_PROVIDER_SITE_OTHER): Payer: Medicare HMO | Admitting: Family Medicine

## 2019-05-02 ENCOUNTER — Encounter: Payer: Self-pay | Admitting: Family Medicine

## 2019-05-02 VITALS — Ht 66.0 in | Wt 168.0 lb

## 2019-05-02 DIAGNOSIS — F411 Generalized anxiety disorder: Secondary | ICD-10-CM | POA: Diagnosis not present

## 2019-05-02 DIAGNOSIS — M25551 Pain in right hip: Secondary | ICD-10-CM | POA: Diagnosis not present

## 2019-05-02 DIAGNOSIS — F418 Other specified anxiety disorders: Secondary | ICD-10-CM | POA: Diagnosis not present

## 2019-05-02 DIAGNOSIS — G8929 Other chronic pain: Secondary | ICD-10-CM | POA: Diagnosis not present

## 2019-05-02 DIAGNOSIS — R1031 Right lower quadrant pain: Secondary | ICD-10-CM

## 2019-05-02 MED ORDER — DULOXETINE HCL 30 MG PO CPEP: 30.0000 mg | ORAL_CAPSULE | Freq: Every day | ORAL | 3 refills | Status: DC

## 2019-05-02 MED ORDER — ALPRAZOLAM 0.5 MG PO TABS: 0.5000 mg | ORAL_TABLET | Freq: Two times a day (BID) | ORAL | 0 refills | Status: DC | PRN

## 2019-05-02 NOTE — Progress Notes (Signed)
Really been having a bad time. Insides are bothering me and my head is sore.  She had to call a plummer out last night and she's upset about that.Laurie [name], Lahoma Crocker, CMA

## 2019-05-02 NOTE — Progress Notes (Signed)
Virtual Visit via Telephone Note  I connected with [name] on 05/02/19 at 10:50 AM EDT by telephone and verified that I am speaking with the correct person using two identifiers.   I discussed the limitations, risks, security and privacy concerns of performing an evaluation and management service by telephone and the  availability of in person appointments. I also discussed with the patient that there may be a patient responsible charge related to this service. The patient expressed understanding and agreed to proceed.   Subjective:    CC: Increase stress.   HPI:  She has been stressed out lately.  She wants an early refill of her xanax. Her son died a year ago and she has still been grieving for him.  He actually did a lot for her as far as cooking cleaning etc. just checking on her.  She is just really been missing him lately.  He took care of a lot of things that she is now having to do for herself and just feels a little stressed and overwhelmed by that.  Skin rash on scalp - she was using the clobetasol solutions and ketoconazole and it wasn't helping and then had an episode of pain on the left side of her head radiating into the temple and down her neck.  Took tylenol. Says her scalp is really tender. She has noticed a few scab. She feels ike the soreness is better.   F/U RLQ pain - saw GI virtually.  Dr. Loletha Carrow told her to do the Miralax BID.  Had a good bowel movement yesterday.  Her right hip still hurts.    Past medical history, Surgical history, Family history not pertinant except as noted below, Social history, Allergies, and medications have been entered into the medical record, reviewed, and corrections made.   Review of Systems: No fevers, chills, night sweats, weight loss, chest pain, or shortness of breath.   Objective:    General: Speaking clearly in complete sentences without any shortness of breath.  Alert and oriented x3.  Normal judgment. No apparent acute  distress.    Impression and Recommendations:    Depression and anxiety -discussed that I really feel like we should treat her underlying mood and reminded her again that Xanax is really to be used as a rescue medication as needed and not to be dependent on daily.  She was open to trying an SSRI or SNRI.  So send over prescription for Cymbalta which I think could help with some of her chronic abdominal and hip pain that she has been having in addition to helping with her mood.  I think talking with someone will be helpful for her as well so we will broach that again when we follow-up in 3 to 4 weeks.  RAsh - will refer dermatology.  Based on her description of the intense pain and headache and was wondered if it could be shingles but she says it is actually feeling a little better today.  I explained that this is not a typical reaction to the topical steroid but she did discontinue it just in case.  Right hip pain -discussed that we could always get her in with sports medicine for that hip if she would like.  Right now she just wants to hold off.  Right lower quadrant pain-difficult to tell how much of its abdominal and how much of it is related to her hip.  Dr. Olena Heckle did recommend that she increase her MiraLAX to twice a  day and she said she did have a good bowel movement yesterday so that is reassuring.    I discussed the assessment and treatment plan with the patient. The patient was provided an opportunity to ask questions and all were answered. The patient agreed with the plan and demonstrated an understanding of the instructions.   The patient was advised to call back or seek an in-person evaluation if the symptoms worsen or if the condition fails to improve as anticipated.  I provided 25 minutes of non-face-to-face time during this encounter.   Beatrice Lecher, MD

## 2019-05-07 ENCOUNTER — Other Ambulatory Visit: Payer: Self-pay | Admitting: Family Medicine

## 2019-05-07 DIAGNOSIS — L219 Seborrheic dermatitis, unspecified: Secondary | ICD-10-CM

## 2019-05-12 ENCOUNTER — Telehealth: Payer: Self-pay

## 2019-05-12 NOTE — Telephone Encounter (Signed)
Left a detailed message for the patient about her upcoming appointment and switching it to a virtual video or telephone visit for the same day and time and if she could give our office a call and someone will be able to assist her.

## 2019-05-17 ENCOUNTER — Ambulatory Visit: Payer: Medicare HMO | Admitting: Cardiology

## 2019-05-18 ENCOUNTER — Telehealth: Payer: Self-pay

## 2019-05-18 MED ORDER — AMOXICILLIN 500 MG PO TABS: 2000.0000 mg | ORAL_TABLET | Freq: Once | ORAL | 0 refills | Status: AC

## 2019-05-18 NOTE — Telephone Encounter (Signed)
Pt called- has dentist appt on Monday and needs antibiotic called in   RX pended

## 2019-05-18 NOTE — Telephone Encounter (Signed)
Pt advised.

## 2019-05-18 NOTE — Telephone Encounter (Signed)
Med sent.

## 2019-05-23 ENCOUNTER — Encounter: Payer: Self-pay | Admitting: Cardiology

## 2019-05-23 ENCOUNTER — Ambulatory Visit: Payer: Medicare HMO | Admitting: Family Medicine

## 2019-05-23 ENCOUNTER — Telehealth: Payer: Self-pay | Admitting: Cardiology

## 2019-05-23 NOTE — Telephone Encounter (Signed)
New Message ° ° °Patient returning your call. °

## 2019-05-23 NOTE — Telephone Encounter (Signed)
This encounter was created in error - please disregard.

## 2019-05-23 NOTE — Telephone Encounter (Signed)
Left message for pt to call.

## 2019-05-23 NOTE — Telephone Encounter (Signed)
Spoke to patient who states she is having multiple teeth pulled, it is more than one- and unknown of what the date of the procedure is until they know when to hold Xarelto.   She states last time she held for 2 days- but the dentist said something about 4 days. Patient would just like to know what she should do. I advised we may need a clearance from her dentist office- but she just wants to know what to do for now?   Route to MD and nurse.

## 2019-05-23 NOTE — Telephone Encounter (Signed)
Patient is returning call. Please call back

## 2019-05-23 NOTE — Telephone Encounter (Signed)
New Message     Pt is having her teeth pulled and is wondering how long she needs to stay off of Xarelto    Please call

## 2019-05-23 NOTE — Telephone Encounter (Signed)
Left detailed message for patient of dr Jacalyn Lefevre recommendations.

## 2019-05-23 NOTE — Telephone Encounter (Signed)
Hold Xarelto 3 days prior to procedure and resume day after.  Needs SBE prophylaxis.

## 2019-06-05 ENCOUNTER — Ambulatory Visit (INDEPENDENT_AMBULATORY_CARE_PROVIDER_SITE_OTHER): Payer: Medicare HMO | Admitting: Family Medicine

## 2019-06-05 ENCOUNTER — Other Ambulatory Visit: Payer: Self-pay

## 2019-06-05 ENCOUNTER — Encounter: Payer: Self-pay | Admitting: Family Medicine

## 2019-06-05 VITALS — BP 147/88 | HR 82 | Ht 66.0 in | Wt 169.0 lb

## 2019-06-05 DIAGNOSIS — L03113 Cellulitis of right upper limb: Secondary | ICD-10-CM | POA: Diagnosis not present

## 2019-06-05 DIAGNOSIS — K5900 Constipation, unspecified: Secondary | ICD-10-CM | POA: Diagnosis not present

## 2019-06-05 MED ORDER — DOXYCYCLINE HYCLATE 100 MG PO TABS: 100.0000 mg | ORAL_TABLET | Freq: Two times a day (BID) | ORAL | 0 refills | Status: DC

## 2019-06-05 NOTE — Progress Notes (Signed)
Acute Office Visit  Subjective:    Patient ID: Laurie [name], female    DOB: 1935/10/02, 84 y.o.   MRN: [id number]  Chief Complaint  Patient presents with  . Wound Check    HPI Patient is in today for wound on arm.  She says her dog jumped up and his nail scratched her arm.  She came in today because she is concerned that it continues to look worse.  She feels like the redness is spreading.  She says now that arm just feels achy.  No fevers chills or sweats.  No drainage from the wound.  Using a Band-Aid for the last couple days.  She is been cleaning it with antibacterial soap and then applying an antibacterial cream.  F/U  Chronic constipation -been using the MiraLAX twice a day and still passing chunks of stool.  But she has been moving more regularly.  She still just feels like her abdomen is a little bit too firm.  Past Medical History:  Diagnosis Date  . AAA (abdominal aortic aneurysm) ([place])   . Anemia    iron deficiency  . Ascending aorta dilatation ([facility])   . Atrial fibrillation ([place])    post op  . CAD (coronary artery disease)   . Carcinoma of lung ([place]) 08/28/2008   T2N0 moderately diff. squamous cell CA  . Cerebrovascular disease   . COPD (chronic obstructive pulmonary disease) ([place]) 2005   on CXR with R apical scaring  . Headache(784.0)    Hx: of migraines in the past  . Hypercholesterolemia    Hx: of  . Hypertension   . Incidental pulmonary nodule, > 33mm and < 43mm    LLL  . S/P aortic valve replacement 08/28/2008   [facility]  . S/P lobectomy of lung 08/28/2008   rt middle lobe  dr. Roxy Manns  . Shingles 11-09    Past Surgical History:  Procedure Laterality Date  . ABDOMINAL SURGERY    . AORTIC VALVE REPLACEMENT  08/28/2008   #50mm [facility] Ease pericardial tissue valve  . APPENDECTOMY    . CARDIAC CATHETERIZATION    . CATARACT EXTRACTION W/ INTRAOCULAR LENS  IMPLANT, BILATERAL Bilateral   . CHOLECYSTECTOMY N/A 12/13/2013   Procedure: LAPAROSCOPIC  CHOLECYSTECTOMY;  Surgeon: Ralene Ok, MD;  Location: [facility];  Service: General;  Laterality: N/A;  . COLONOSCOPY     \  . DILATION AND CURETTAGE OF UTERUS    . ERCP N/A 05/08/2015   Procedure: ENDOSCOPIC RETROGRADE CHOLANGIOPANCREATOGRAPHY (ERCP);  Surgeon: Inda Castle, MD;  Location: [facility];  Service: Endoscopy;  Laterality: N/A;  . ERCP N/A 01/13/2016   Procedure: ENDOSCOPIC RETROGRADE CHOLANGIOPANCREATOGRAPHY (ERCP);  Surgeon: Doran Stabler, MD;  Location: [facility];  Service: Endoscopy;  Laterality: N/A;  . ERCP N/A 03/09/2016   Procedure: ENDOSCOPIC RETROGRADE CHOLANGIOPANCREATOGRAPHY (ERCP);  Surgeon: Ladene Artist, MD;  Location: [facility];  Service: Endoscopy;  Laterality: N/A;  . PACEMAKER IMPLANT N/A 12/05/2018   Procedure: PACEMAKER IMPLANT;  Surgeon: Evans Lance, MD;  Location: [facility];  Service: Cardiovascular;  Laterality: N/A;  . RML removed Dr Ricard Dillon for lung cancer  08/28/2008   T2N0 squamous cell CA  . TONSILLECTOMY AND ADENOIDECTOMY    . u/s guided aspiration of R breast cyst   2009   at the breast clinic    Family History  Problem Relation Age of Onset  . Stroke Father   . Diabetes Son  brittle diabetes  . Colon polyps Neg Hx   . Colon cancer Neg Hx   . Esophageal cancer Neg Hx   . Stomach cancer Neg Hx   . Pancreatic cancer Neg Hx   . Kidney disease Neg Hx   . Liver disease Neg Hx     Social History   Socioeconomic History  . Marital status: Single    Spouse name: Not on file  . Number of children: Not on file  . Years of education: Not on file  . Highest education level: Not on file  Occupational History  . Not on file  Social Needs  . Financial resource strain: Not on file  . Food insecurity    Worry: Not on file    Inability: Not on file  . Transportation needs    Medical: Not on file    Non-medical: Not on file  Tobacco Use  . Smoking status: Former Smoker    Years: 40.00  . Smokeless tobacco:  Never Used  Substance and Sexual Activity  . Alcohol use: No    Alcohol/week: 0.0 standard drinks  . Drug use: No  . Sexual activity: Not on file  Lifestyle  . Physical activity    Days per week: Not on file    Minutes per session: Not on file  . Stress: Not on file  Relationships  . Social Herbalist on phone: Not on file    Gets together: Not on file    Attends religious service: Not on file    Active member of club or organization: Not on file    Attends meetings of clubs or organizations: Not on file    Relationship status: Not on file  . Intimate partner violence    Fear of current or ex partner: Not on file    Emotionally abused: Not on file    Physically abused: Not on file    Forced sexual activity: Not on file  Other Topics Concern  . Not on file  Social History Narrative  . Not on file    Outpatient Medications Prior to Visit  Medication Sig Dispense Refill  . acetaminophen (TYLENOL) 500 MG tablet Take 2 tablets (1,000 mg total) by mouth every 8 (eight) hours as needed (pain). 30 tablet 0  . albuterol (PROVENTIL) (2.5 MG/3ML) 0.083% nebulizer solution Take 3 mLs (2.5 mg total) by nebulization every 4 (four) hours as needed for wheezing or shortness of breath. 30 vial 0  . ALPRAZolam (XANAX) 0.5 MG tablet Take 1 tablet (0.5 mg total) by mouth 2 (two) times daily as needed for anxiety. TAKE 0.5-1 TABLETS (0.25-5 MG TOTAL) BY MOUTH 2 (TWO) TIMES DAILY AS NEEDED. 60 tablet 0  . amiodarone (PACERONE) 200 MG tablet Take 1 tablet (200 mg total) by mouth daily. 90 tablet 3  . lisinopril (PRINIVIL,ZESTRIL) 20 MG tablet Take 1 tablet (20 mg total) by mouth daily. 90 tablet 3  . polyethylene glycol powder (GLYCOLAX/MIRALAX) powder Take 17 g by mouth daily. (Patient taking differently: Take 17 g by mouth daily as needed (constipation). ) 250 g 2  . XARELTO 20 MG TABS tablet TAKE 1 TABLET (20 MG TOTAL) BY MOUTH DAILY WITH SUPPER. (Patient taking differently: Take 20 mg by  mouth daily after supper. High risk med: Anticoagulant.  Crushed Xarelto can be given down a G-tube but NOT a J-Tube.) 90 tablet 1  . amLODipine (NORVASC) 5 MG tablet Take 1 tablet (5 mg total) by mouth daily. (Patient  taking differently: Take 5 mg by mouth daily after supper. ) 90 tablet 3  . Betamethasone Valerate 0.12 % foam Apply 1 application topically 2 (two) times daily. 100 g 2  . clobetasol (TEMOVATE) 0.05 % external solution Apply 1 application topically at bedtime. To scalp 50 mL 0  . Docusate Sodium (STOOL SOFTENER LAXATIVE PO) Take 1 capsule by mouth at bedtime as needed (constipation).     . DULoxetine (CYMBALTA) 30 MG capsule Take 1 capsule (30 mg total) by mouth daily. 30 capsule 3  . ketoconazole (NIZORAL) 2 % shampoo APPLY 1 APPLICATION TOPICALLY 2 (TWO) TIMES A WEEK. 120 mL 0  . magnesium citrate SOLN 150 mL 1 - 2 times po daily prn severe constipaiton 500 mL 0   No facility-administered medications prior to visit.     Allergies  Allergen Reactions  . Hyoscyamine Other (See Comments)    Mental status changes.  (as of 02/13/16 patient denies any problems with this medication)  . Rofecoxib Anaphylaxis and Hives  . Statins Other (See Comments)    Muscle aches  . Metoprolol Other (See Comments)    Extreme fatigue, weakness and brady    ROS     Objective:    Physical Exam  Constitutional: She is oriented to person, place, and time. She appears well-developed and well-nourished.  HENT:  Head: Normocephalic and atraumatic.  Eyes: Conjunctivae and EOM are normal.  Cardiovascular: Normal rate.  Pulmonary/Chest: Effort normal.  Neurological: She is alert and oriented to person, place, and time.  Skin: Skin is dry. No pallor.  On the right posterior forearm there is an erythematous area measuring 4 x 1.5 cm in size with a scab in the center. It looks swollen but no active drainage.    Psychiatric: She has a normal mood and affect. Her behavior is normal.  Vitals  reviewed.   BP (!) 147/88   Pulse 82   Ht 5\' 6"  (1.676 m)   Wt 169 lb (76.7 kg)   SpO2 100%   BMI 27.28 kg/m  Wt Readings from Last 3 Encounters:  06/05/19 169 lb (76.7 kg)  05/02/19 168 lb (76.2 kg)  04/12/19 169 lb (76.7 kg)    Health Maintenance Due  Topic Date Due  . PNA vac Low Risk Adult (2 of 2 - PCV13) 07/31/2009    There are no preventive care reminders to display for this patient.   Lab Results  Component Value Date   TSH 2.86 04/04/2019   Lab Results  Component Value Date   WBC 6.9 04/04/2019   HGB 13.9 04/04/2019   HCT 40.8 04/04/2019   MCV 86.8 04/04/2019   PLT 317 04/04/2019   Lab Results  Component Value Date   NA 132 (L) 04/04/2019   K 4.5 04/04/2019   CO2 25 04/04/2019   GLUCOSE 109 (H) 04/04/2019   BUN 12 04/04/2019   CREATININE 0.84 04/04/2019   BILITOT 0.6 04/04/2019   ALKPHOS 92 01/17/2019   AST 21 04/04/2019   ALT 21 04/04/2019   PROT 6.6 04/04/2019   ALBUMIN 3.8 01/17/2019   CALCIUM 9.4 04/04/2019   ANIONGAP 9 02/07/2019   GFR 87.46 04/25/2015   Lab Results  Component Value Date   CHOL 291 (H) 09/18/2010   Lab Results  Component Value Date   HDL 38 (L) 09/18/2010   Lab Results  Component Value Date   LDLCALC 191 (H) 09/18/2010   Lab Results  Component Value Date   TRIG 308 (H) 09/18/2010  Lab Results  Component Value Date   CHOLHDL 7.7 Ratio 09/18/2010   Lab Results  Component Value Date   HGBA1C 6.2 (H) 04/18/2015       Assessment & Plan:   Problem List Items Addressed This Visit    None    Visit Diagnoses    Cellulitis of right upper extremity    -  Primary     Discussed tx options.  Stop using topical antibacterial cream and to switch to plain Vaseline.  Will ahead and start doxycycline.  Call if not healing well over the next couple days and or if she notices any drainage from the wound.  Also consider that she could be getting a contact dermatitis from the antibiotic cream especially since she is  been using it for more than a week at this point.   Obstipation-she wants to know if she can use another type of laxative.  She has been using MiraLAX twice a day and still passing stool chunks as she describes it.  Okay to use a stimulant laxative such as Dulcolax or Ex-Lax which can be found over-the-counter.  Meds ordered this encounter  Medications  . doxycycline (VIBRA-TABS) 100 MG tablet    Sig: Take 1 tablet (100 mg total) by mouth 2 (two) times daily.    Dispense:  20 tablet    Refill:  0     Beatrice Lecher, MD

## 2019-06-15 ENCOUNTER — Telehealth: Payer: Self-pay | Admitting: Cardiology

## 2019-06-15 DIAGNOSIS — I10 Essential (primary) hypertension: Secondary | ICD-10-CM

## 2019-06-15 MED ORDER — FUROSEMIDE 20 MG PO TABS: 20.0000 mg | ORAL_TABLET | ORAL | 0 refills | Status: DC | PRN

## 2019-06-15 NOTE — Telephone Encounter (Signed)
° ° °  Pt c/o swelling: STAT is pt has developed SOB within 24 hours  1) How much weight have you gained and in what time span? Not sure  2) If swelling, where is the swelling located? Feet and legs  3) Are you currently taking a fluid pill? no  4) Are you currently SOB? no  5) Do you have a log of your daily weights (if so, list)? No. Patient states her weight stays the same  6) Have you gained 3 pounds in a day or 5 pounds in a week? Not sure  7) Have you traveled recently? No  Patient states that her legs from the knees down are swollen, feel cold and have a burning tyle sensation. She does not track her weight, but the house shoes she normally wears are tight on her, but they are usually fairly loose.  She feels she may need to start taking a fluid pill to reduce swelling

## 2019-06-15 NOTE — Telephone Encounter (Signed)
Called patient informed her to start 20 mg of lasix daily as needed for leg swelling per Dr. Stanford Breed and to have labs drawn in 2 weeks. Patient verbally understands no further questions.

## 2019-06-15 NOTE — Telephone Encounter (Signed)
Lasix 20 mg daily as needed; bmet 2 weeks  [name]

## 2019-06-16 ENCOUNTER — Telehealth: Payer: Self-pay

## 2019-06-16 ENCOUNTER — Ambulatory Visit (INDEPENDENT_AMBULATORY_CARE_PROVIDER_SITE_OTHER): Payer: Medicare HMO | Admitting: *Deleted

## 2019-06-16 DIAGNOSIS — I480 Paroxysmal atrial fibrillation: Secondary | ICD-10-CM

## 2019-06-16 MED ORDER — HYDROCODONE-ACETAMINOPHEN 5-325 MG PO TABS: 1.0000 | ORAL_TABLET | Freq: Three times a day (TID) | ORAL | 0 refills | Status: DC | PRN

## 2019-06-16 NOTE — Telephone Encounter (Signed)
Patient states that Tramadol doesn't work for her. She would like something stronger. Patient advised of recommendations.

## 2019-06-16 NOTE — Telephone Encounter (Signed)
Can you see if tramadol works for her?  I could certainly send that or something else in.  We could also try to get her on sports med schedule sometime next week maybe she will be feeling better enough to come in and have it checked out.

## 2019-06-16 NOTE — Telephone Encounter (Signed)
The pt states that she had a ppm put in in January. She states that her ppm site is hurting her. Pt said it is a dull sore pain. There is no bruising, and no fever. I let her speak with device tech Jenny Reichmann, RN.

## 2019-06-16 NOTE — Telephone Encounter (Signed)
OK, rx sent for hydrocodone

## 2019-06-16 NOTE — Telephone Encounter (Signed)
Vada states she has a flare up of her hip pain. She states she can't come in today due to the pain. She is requesting some pain medication. Please advise.

## 2019-06-16 NOTE — Telephone Encounter (Signed)
Spoke with patient. Reports intermittent pain under left arm after she sleeps on her left side. Rates pain 3/10 , no edema or redness at pacemaker site. Implanted 12/05/18. Pain started 3-4 weeks ago. intermittant 3/10 pain around edges of pacemaker if she presses down directly on pacemaker incision. Pain is not constant and reported to be more noticeable after waking up in the morning on her left side. No CP and no SHOB.   Pt concerned about pacemaker function, manual transmission sent successfully, normal device function, pt reassured. Pt has f/u with Dr Lovena Le 07/05/19. Educated to call office if she develops redness, drainage or swelling in area of pacemaker.

## 2019-06-18 LAB — CUP PACEART REMOTE DEVICE CHECK
Date Time Interrogation Session: 20200719091153
Implantable Lead Implant Date: 20200106
Implantable Lead Implant Date: 20200106
Implantable Lead Location: 753859
Implantable Lead Location: 753860
Implantable Pulse Generator Implant Date: 20200106
Pulse Gen Model: 2272
Pulse Gen Serial Number: 9089721

## 2019-06-19 NOTE — Telephone Encounter (Signed)
Patient advised.

## 2019-06-21 ENCOUNTER — Encounter: Payer: Self-pay | Admitting: Cardiology

## 2019-06-21 NOTE — Progress Notes (Signed)
Remote pacemaker transmission.   

## 2019-06-22 ENCOUNTER — Ambulatory Visit (INDEPENDENT_AMBULATORY_CARE_PROVIDER_SITE_OTHER): Payer: Medicare HMO | Admitting: Family Medicine

## 2019-06-22 ENCOUNTER — Telehealth: Payer: Self-pay

## 2019-06-22 ENCOUNTER — Encounter: Payer: Self-pay | Admitting: Family Medicine

## 2019-06-22 ENCOUNTER — Ambulatory Visit: Payer: Medicare HMO | Admitting: Family Medicine

## 2019-06-22 ENCOUNTER — Other Ambulatory Visit: Payer: Self-pay

## 2019-06-22 VITALS — BP 118/63 | HR 77 | Ht 66.0 in | Wt 167.0 lb

## 2019-06-22 DIAGNOSIS — R300 Dysuria: Secondary | ICD-10-CM | POA: Diagnosis not present

## 2019-06-22 DIAGNOSIS — M25551 Pain in right hip: Secondary | ICD-10-CM | POA: Diagnosis not present

## 2019-06-22 DIAGNOSIS — G8929 Other chronic pain: Secondary | ICD-10-CM | POA: Diagnosis not present

## 2019-06-22 LAB — POCT URINALYSIS DIPSTICK
Bilirubin, UA: NEGATIVE
Blood, UA: NEGATIVE
Glucose, UA: NEGATIVE
Ketones, UA: NEGATIVE
Leukocytes, UA: NEGATIVE
Nitrite, UA: NEGATIVE
Protein, UA: NEGATIVE
Spec Grav, UA: 1.01 (ref 1.010–1.025)
Urobilinogen, UA: 0.2 E.U./dL
pH, UA: 7 (ref 5.0–8.0)

## 2019-06-22 NOTE — Telephone Encounter (Signed)
Patient called stating that she was under the impression was going to get some pain medication after her visit today. Noted that office visit notes not completed, asked patient to be patient and continue to check with her pharmacy.

## 2019-06-23 ENCOUNTER — Other Ambulatory Visit: Payer: Self-pay | Admitting: Sports Medicine

## 2019-06-23 ENCOUNTER — Telehealth: Payer: Self-pay | Admitting: Family Medicine

## 2019-06-23 DIAGNOSIS — G8929 Other chronic pain: Secondary | ICD-10-CM | POA: Insufficient documentation

## 2019-06-23 DIAGNOSIS — M25551 Pain in right hip: Secondary | ICD-10-CM

## 2019-06-23 LAB — CBC WITH DIFFERENTIAL/PLATELET
Absolute Monocytes: 1022 cells/uL — ABNORMAL HIGH (ref 200–950)
Basophils Absolute: 29 cells/uL (ref 0–200)
Basophils Relative: 0.4 %
Eosinophils Absolute: 131 cells/uL (ref 15–500)
Eosinophils Relative: 1.8 %
HCT: 44.4 % (ref 35.0–45.0)
Hemoglobin: 14.9 g/dL (ref 11.7–15.5)
Lymphs Abs: 1161 cells/uL (ref 850–3900)
MCH: 28.8 pg (ref 27.0–33.0)
MCHC: 33.6 g/dL (ref 32.0–36.0)
MCV: 85.7 fL (ref 80.0–100.0)
MPV: 10.1 fL (ref 7.5–12.5)
Monocytes Relative: 14 %
Neutro Abs: 4957 cells/uL (ref 1500–7800)
Neutrophils Relative %: 67.9 %
Platelets: 273 10*3/uL (ref 140–400)
RBC: 5.18 10*6/uL — ABNORMAL HIGH (ref 3.80–5.10)
RDW: 12.7 % (ref 11.0–15.0)
Total Lymphocyte: 15.9 %
WBC: 7.3 10*3/uL (ref 3.8–10.8)

## 2019-06-23 LAB — BASIC METABOLIC PANEL WITH GFR
BUN: 12 mg/dL (ref 7–25)
CO2: 27 mmol/L (ref 20–32)
Calcium: 9.7 mg/dL (ref 8.6–10.4)
Chloride: 93 mmol/L — ABNORMAL LOW (ref 98–110)
Creat: 0.88 mg/dL (ref 0.60–0.88)
GFR, Est African American: 71 mL/min/{1.73_m2} (ref >=60)
GFR, Est Non African American: 61 mL/min/{1.73_m2} (ref >=60)
Glucose, Bld: 88 mg/dL (ref 65–99)
Potassium: 4.2 mmol/L (ref 3.5–5.3)
Sodium: 130 mmol/L — ABNORMAL LOW (ref 135–146)

## 2019-06-23 MED ORDER — HYDROCODONE-ACETAMINOPHEN 5-325 MG PO TABS: 1.0000 | ORAL_TABLET | Freq: Three times a day (TID) | ORAL | 0 refills | Status: DC | PRN

## 2019-06-23 NOTE — Telephone Encounter (Signed)
Patient advised.

## 2019-06-23 NOTE — Telephone Encounter (Signed)
Yes I apologize I had sent him to [place] that has been complete before the end of the day yesterday.  But I sent the prescription over this morning.

## 2019-06-23 NOTE — Assessment & Plan Note (Signed)
Chronic right hip pain, only mild osteoarthritis on x-rays. After discussion with Dr. Madilyn Fireman we do have a concern for avascular necrosis, as x-rays were negative there was also a concern for hip labral injury. Orders placed for MR arthrogram. I suspect this will end up being done by Dr. Georgina Snell on Monday.

## 2019-06-23 NOTE — Telephone Encounter (Signed)
Prior authorization obtained for MRI right hip w/ contrast [id number].  Authorization number is [id number] good from 06/23/19 to 07/23/19. Given to Vivia Ewing to enter into work que. Imaging notified as well. KG LPN

## 2019-06-23 NOTE — Progress Notes (Addendum)
Established Patient Office Visit  Subjective:  Patient ID: Laurie [name], female    DOB: Dec 15, 1934  Age: 84 y.o. MRN: [id number]  CC:  Chief Complaint  Patient presents with  . Abdominal Pain  . Hip Pain    HPI Laurie [name] presents for persistent hip pain-this is been going on for months at this point.  She says now which is gradually getting worse to the point that it hurt constantly whether she is standing sitting or active or not it just constantly hurts.  It is mostly in that right groin crease but does shoot across the pelvic area at times and sometimes into her buttock area over the ischio bursa area.  It is getting painful she cannot sleep on that hip.  In fact she is actually using a walker today which I have not seen her use before because she just feels like it is hurting her enough that she is worried she may fall.  She says it pretty much hurts if she stands cuts or lays down.  She feels like her leg either feels like it is on fire or feels very cold.  She does feel like her bowels are moving more regularly in fact she is having 1-2 bowel movements a day she was previously struggling with some constipation so that does seem to be better but more recently says that her stools have been very thin which is a little unusual but she has not noticed any blood etc.  She also reports for the last few days she has had some burning with urination.  No blood in the urine.  No fevers chills or sweats.  Past Medical History:  Diagnosis Date  . AAA (abdominal aortic aneurysm) ([place])   . Anemia    iron deficiency  . Ascending aorta dilatation ([facility])   . Atrial fibrillation ([place])    post op  . CAD (coronary artery disease)   . Carcinoma of lung ([place]) 08/28/2008   T2N0 moderately diff. squamous cell CA  . Cerebrovascular disease   . COPD (chronic obstructive pulmonary disease) ([place]) 2005   on CXR with R apical scaring  . Headache(784.0)    Hx: of migraines in the past  .  Hypercholesterolemia    Hx: of  . Hypertension   . Incidental pulmonary nodule, > 36mm and < 40mm    LLL  . S/P aortic valve replacement 08/28/2008   [facility]  . S/P lobectomy of lung 08/28/2008   rt middle lobe  dr. Roxy Manns  . Shingles 11-09    Past Surgical History:  Procedure Laterality Date  . ABDOMINAL SURGERY    . AORTIC VALVE REPLACEMENT  08/28/2008   #104mm [facility] Ease pericardial tissue valve  . APPENDECTOMY    . CARDIAC CATHETERIZATION    . CATARACT EXTRACTION W/ INTRAOCULAR LENS  IMPLANT, BILATERAL Bilateral   . CHOLECYSTECTOMY N/A 12/13/2013   Procedure: LAPAROSCOPIC CHOLECYSTECTOMY;  Surgeon: Ralene Ok, MD;  Location: [place];  Service: General;  Laterality: N/A;  . COLONOSCOPY     \  . DILATION AND CURETTAGE OF UTERUS    . ERCP N/A 05/08/2015   Procedure: ENDOSCOPIC RETROGRADE CHOLANGIOPANCREATOGRAPHY (ERCP);  Surgeon: Inda Castle, MD;  Location: [place];  Service: Endoscopy;  Laterality: N/A;  . ERCP N/A 01/13/2016   Procedure: ENDOSCOPIC RETROGRADE CHOLANGIOPANCREATOGRAPHY (ERCP);  Surgeon: Doran Stabler, MD;  Location: [facility];  Service: Endoscopy;  Laterality: N/A;  . ERCP N/A 03/09/2016   Procedure: ENDOSCOPIC  RETROGRADE CHOLANGIOPANCREATOGRAPHY (ERCP);  Surgeon: Ladene Artist, MD;  Location: [facility];  Service: Endoscopy;  Laterality: N/A;  . PACEMAKER IMPLANT N/A 12/05/2018   Procedure: PACEMAKER IMPLANT;  Surgeon: Evans Lance, MD;  Location: [facility];  Service: Cardiovascular;  Laterality: N/A;  . RML removed Dr Ricard Dillon for lung cancer  08/28/2008   T2N0 squamous cell CA  . TONSILLECTOMY AND ADENOIDECTOMY    . u/s guided aspiration of R breast cyst   2009   at the breast clinic    Family History  Problem Relation Age of Onset  . Stroke Father   . Diabetes Son        brittle diabetes  . Colon polyps Neg Hx   . Colon cancer Neg Hx   . Esophageal cancer Neg Hx   . Stomach cancer Neg Hx   . Pancreatic cancer Neg Hx   .  Kidney disease Neg Hx   . Liver disease Neg Hx     Social History   Socioeconomic History  . Marital status: Single    Spouse name: Not on file  . Number of children: Not on file  . Years of education: Not on file  . Highest education level: Not on file  Occupational History  . Not on file  Social Needs  . Financial resource strain: Not on file  . Food insecurity    Worry: Not on file    Inability: Not on file  . Transportation needs    Medical: Not on file    Non-medical: Not on file  Tobacco Use  . Smoking status: Former Smoker    Years: 40.00  . Smokeless tobacco: Never Used  Substance and Sexual Activity  . Alcohol use: No    Alcohol/week: 0.0 standard drinks  . Drug use: No  . Sexual activity: Not on file  Lifestyle  . Physical activity    Days per week: Not on file    Minutes per session: Not on file  . Stress: Not on file  Relationships  . Social Herbalist on phone: Not on file    Gets together: Not on file    Attends religious service: Not on file    Active member of club or organization: Not on file    Attends meetings of clubs or organizations: Not on file    Relationship status: Not on file  . Intimate partner violence    Fear of current or ex partner: Not on file    Emotionally abused: Not on file    Physically abused: Not on file    Forced sexual activity: Not on file  Other Topics Concern  . Not on file  Social History Narrative  . Not on file    Outpatient Medications Prior to Visit  Medication Sig Dispense Refill  . acetaminophen (TYLENOL) 500 MG tablet Take 2 tablets (1,000 mg total) by mouth every 8 (eight) hours as needed (pain). 30 tablet 0  . albuterol (PROVENTIL) (2.5 MG/3ML) 0.083% nebulizer solution Take 3 mLs (2.5 mg total) by nebulization every 4 (four) hours as needed for wheezing or shortness of breath. 30 vial 0  . amiodarone (PACERONE) 200 MG tablet Take 1 tablet (200 mg total) by mouth daily. 90 tablet 3  .  furosemide (LASIX) 20 MG tablet Take 1 tablet (20 mg total) by mouth as needed for edema (for leg swelling). 90 tablet 0  . lisinopril (PRINIVIL,ZESTRIL) 20 MG tablet Take 1 tablet (20 mg  total) by mouth daily. 90 tablet 3  . polyethylene glycol powder (GLYCOLAX/MIRALAX) powder Take 17 g by mouth daily. (Patient taking differently: Take 17 g by mouth daily as needed (constipation). ) 250 g 2  . XARELTO 20 MG TABS tablet TAKE 1 TABLET (20 MG TOTAL) BY MOUTH DAILY WITH SUPPER. (Patient taking differently: Take 20 mg by mouth daily after supper. High risk med: Anticoagulant.  Crushed Xarelto can be given down a G-tube but NOT a J-Tube.) 90 tablet 1  . ALPRAZolam (XANAX) 0.5 MG tablet Take 1 tablet (0.5 mg total) by mouth 2 (two) times daily as needed for anxiety. TAKE 0.5-1 TABLETS (0.25-5 MG TOTAL) BY MOUTH 2 (TWO) TIMES DAILY AS NEEDED. 60 tablet 0   No facility-administered medications prior to visit.     Allergies  Allergen Reactions  . Hyoscyamine Other (See Comments)    Mental status changes.  (as of 02/13/16 patient denies any problems with this medication)  . Rofecoxib Anaphylaxis and Hives  . Statins Other (See Comments)    Muscle aches  . Metoprolol Other (See Comments)    Extreme fatigue, weakness and brady    ROS Review of Systems    Objective:    Physical Exam  Constitutional: She is oriented to person, place, and time. She appears well-developed and well-nourished.  HENT:  Head: Normocephalic and atraumatic.  Cardiovascular: Normal rate, regular rhythm and normal heart sounds.  Pulmonary/Chest: Effort normal and breath sounds normal.  Musculoskeletal:     Comments: Right hip with normal flexion extension.  No significant pain with internal or external rotation.  Hip strength is 5 out of 5.  Neurological: She is alert and oriented to person, place, and time.  Skin: Skin is warm and dry.  Psychiatric: She has a normal mood and affect. Her behavior is normal.    BP  118/63   Pulse 77   Ht 5\' 6"  (1.676 m)   Wt 167 lb (75.8 kg)   SpO2 94%   BMI 26.95 kg/m  Wt Readings from Last 3 Encounters:  06/22/19 167 lb (75.8 kg)  06/05/19 169 lb (76.7 kg)  05/02/19 168 lb (76.2 kg)     Health Maintenance Due  Topic Date Due  . PNA vac Low Risk Adult (2 of 2 - PCV13) 07/31/2009    There are no preventive care reminders to display for this patient.  Lab Results  Component Value Date   TSH 2.86 04/04/2019   Lab Results  Component Value Date   WBC 7.3 06/22/2019   HGB 14.9 06/22/2019   HCT 44.4 06/22/2019   MCV 85.7 06/22/2019   PLT 273 06/22/2019   Lab Results  Component Value Date   NA 130 (L) 06/22/2019   K 4.2 06/22/2019   CO2 27 06/22/2019   GLUCOSE 88 06/22/2019   BUN 12 06/22/2019   CREATININE 0.88 06/22/2019   BILITOT 0.6 04/04/2019   ALKPHOS 92 01/17/2019   AST 21 04/04/2019   ALT 21 04/04/2019   PROT 6.6 04/04/2019   ALBUMIN 3.8 01/17/2019   CALCIUM 9.7 06/22/2019   ANIONGAP 9 02/07/2019   GFR 87.46 04/25/2015   Lab Results  Component Value Date   CHOL 291 (H) 09/18/2010   Lab Results  Component Value Date   HDL 38 (L) 09/18/2010   Lab Results  Component Value Date   LDLCALC 191 (H) 09/18/2010   Lab Results  Component Value Date   TRIG 308 (H) 09/18/2010   Lab Results  Component  Value Date   CHOLHDL 7.7 Ratio 09/18/2010   Lab Results  Component Value Date   HGBA1C 6.2 (H) 04/18/2015      Assessment & Plan:   Problem List Items Addressed This Visit      Other   Chronic right hip pain    Ref her hydrocodone.  We will likely need to put her on chronic pain contract when I see her back and get a urine drug screen.  Because her severity has increased I am concerned about the possibility of avascular necrosis versus labral tear.  Like to move forward with MRI with joint contrast.  We will get that scheduled      Relevant Medications   HYDROcodone-acetaminophen (NORCO/VICODIN) 5-325 MG tablet    Other  Visit Diagnoses    Right hip pain    -  Primary   Relevant Orders   BASIC METABOLIC PANEL WITH GFR (Completed)   CBC with Differential/Platelet (Completed)   Burning with urination       Relevant Orders   BASIC METABOLIC PANEL WITH GFR (Completed)   CBC with Differential/Platelet (Completed)   POCT urinalysis dipstick (Completed)      Burning with urination-we will check urinalysis for possible UTI.  Meds ordered this encounter  Medications  . HYDROcodone-acetaminophen (NORCO/VICODIN) 5-325 MG tablet    Sig: Take 1 tablet by mouth 3 (three) times daily as needed for moderate pain.    Dispense:  15 tablet    Refill:  0    Follow-up: Return in about 4 weeks (around 07/20/2019).    Beatrice Lecher, MD

## 2019-06-23 NOTE — Telephone Encounter (Signed)
Please call her back and let her know that it has been sent to weekend have her check at the pharmacy again.  Also recommend MRI with contrast injection.  To look for the avascular necrosis that I was discussing with her and also to potentially look for labral tear so organ to do is get that scheduled and then right before the injection she will actually come to our office and either Dr. Darene Lamer or Dr. Georgina Snell will inject the contrast into her hip before she goes for the scan.

## 2019-06-23 NOTE — Telephone Encounter (Signed)
Left pt msg advising RX at pharmacy

## 2019-06-23 NOTE — Telephone Encounter (Signed)
Aiva called back and states the pharmacy has not received the pain medication. Please advise.   I don't see a fill in the chart.

## 2019-06-26 ENCOUNTER — Other Ambulatory Visit: Payer: Self-pay | Admitting: Family Medicine

## 2019-06-26 DIAGNOSIS — F411 Generalized anxiety disorder: Secondary | ICD-10-CM

## 2019-06-27 ENCOUNTER — Encounter: Payer: Self-pay | Admitting: Family Medicine

## 2019-06-27 NOTE — Assessment & Plan Note (Addendum)
Ref her hydrocodone.  We will likely need to put her on chronic pain contract when I see her back and get a urine drug screen.  Because her severity has increased I am concerned about the possibility of avascular necrosis versus labral tear.  Like to move forward with MRI with joint contrast.  We will get that scheduled

## 2019-06-28 ENCOUNTER — Telehealth: Payer: Self-pay | Admitting: Cardiology

## 2019-06-28 NOTE — Telephone Encounter (Signed)
No need for bmet [name]

## 2019-06-28 NOTE — Telephone Encounter (Signed)
The patient is due for a 2 week BMET follow up after starting Furosemide 20 mg as needed for edema. She stated she has been taking this everyday but the swelling has subsided now.  She had a BMET drawn at her PCP on 7/23 (reults in epic).   Message sent to the provider to see if he would still like for the BMET to be drawn this week.

## 2019-06-28 NOTE — Telephone Encounter (Signed)
New Message    Patient wants labs that Dr. Stanford Breed order for her to have sent to [facility] in [place].  Please call patient back to discuss.

## 2019-06-28 NOTE — Telephone Encounter (Signed)
The patient has been made aware and verbalized her understanding.  

## 2019-07-03 ENCOUNTER — Ambulatory Visit: Payer: Medicare HMO

## 2019-07-03 ENCOUNTER — Encounter: Payer: Self-pay | Admitting: Family Medicine

## 2019-07-03 ENCOUNTER — Other Ambulatory Visit: Payer: Self-pay

## 2019-07-03 ENCOUNTER — Ambulatory Visit (INDEPENDENT_AMBULATORY_CARE_PROVIDER_SITE_OTHER): Payer: Medicare HMO | Admitting: Family Medicine

## 2019-07-03 VITALS — BP 131/73 | HR 81 | Temp 97.6°F | Wt 164.0 lb

## 2019-07-03 DIAGNOSIS — G8929 Other chronic pain: Secondary | ICD-10-CM | POA: Diagnosis not present

## 2019-07-03 DIAGNOSIS — M25551 Pain in right hip: Secondary | ICD-10-CM | POA: Diagnosis not present

## 2019-07-03 MED ORDER — HYDROCODONE-ACETAMINOPHEN 5-325 MG PO TABS: 1.0000 | ORAL_TABLET | Freq: Three times a day (TID) | ORAL | 0 refills | Status: DC | PRN

## 2019-07-03 NOTE — Patient Instructions (Signed)
Thank you for coming in today. Get MRI now.  We will let you know the results.  Call or go to the ER if you develop a large red swollen joint with extreme pain or oozing puss.  Use hydorcodone sparingly for pain.

## 2019-07-03 NOTE — Progress Notes (Signed)
Patient presents today for scheduled right hip MRI arthrogram.  This is been previously evaluated by my partner Dr. Dianah Field.  After discussion plan for typical right hip arthrogram however will add Kenalog.  Additionally limited refill of hydrocodone for pain control.    Procedure: Real-time Ultrasound Guided Injection of right hip Device: GE Logiq E  Images permanently stored and available for review in the ultrasound unit. Verbal informed consent obtained. Discussed risks and benefits of procedure. Warned about infection bleeding damage to structures skin hypopigmentation and fat atrophy among others. Patient expresses understanding and agreement Time-out conducted.  Noted no overlying erythema, induration, or other signs of local infection.  Skin prepped in a sterile fashion.  Local anesthesia: Topical Ethyl chloride.  With sterile technique and under real time ultrasound guidance:4 mL of Marcaine, 40 mg of Kenalog, 0.1 mL of gadolinium contrast, and 7 mL saline flush injected easily.  Completed without difficulty    Advised to call if fevers/chills, erythema, induration, drainage, or persistent bleeding.  Images permanently stored and available for review in the ultrasound unit.  Impression: Technically successful ultrasound guided injection.

## 2019-07-05 ENCOUNTER — Encounter: Payer: Medicare HMO | Admitting: Internal Medicine

## 2019-07-13 ENCOUNTER — Other Ambulatory Visit: Payer: Self-pay

## 2019-07-13 MED ORDER — HYDROCODONE-ACETAMINOPHEN 5-325 MG PO TABS: 1.0000 | ORAL_TABLET | Freq: Three times a day (TID) | ORAL | 0 refills | Status: DC | PRN

## 2019-07-13 NOTE — Telephone Encounter (Signed)
I will refill but I would recommend ortho referral or get back inw the sports med.  I her pain is that severe it needs to be adressed.

## 2019-07-13 NOTE — Telephone Encounter (Signed)
Laurie [name] called and left a message stating she is still in a lot of pain. She is requesting a refill on pain medication.

## 2019-07-13 NOTE — Telephone Encounter (Signed)
Patient called stating that pain is very bad in her hip, she feels it is worse than it has been. States she is in so much pain she cannot even eat.   Last pain medication given 07/03/19 for #15 tablets, 1 TID PRN pain. Out of meds, wanting refill.   Sending note to Dr Georgina Snell, last one to evaluate patient and give last RX  Please advise

## 2019-07-14 NOTE — Telephone Encounter (Signed)
Patient advised. She will call back next week to schedule an appointment with Dr Dianah Field.

## 2019-07-15 ENCOUNTER — Other Ambulatory Visit: Payer: Self-pay | Admitting: Cardiology

## 2019-07-20 ENCOUNTER — Encounter: Payer: Self-pay | Admitting: Family Medicine

## 2019-07-20 ENCOUNTER — Ambulatory Visit (INDEPENDENT_AMBULATORY_CARE_PROVIDER_SITE_OTHER): Payer: Medicare HMO | Admitting: Family Medicine

## 2019-07-20 VITALS — Temp 97.6°F | Ht 66.0 in

## 2019-07-20 DIAGNOSIS — F411 Generalized anxiety disorder: Secondary | ICD-10-CM | POA: Diagnosis not present

## 2019-07-20 DIAGNOSIS — Z952 Presence of prosthetic heart valve: Secondary | ICD-10-CM

## 2019-07-20 DIAGNOSIS — G8929 Other chronic pain: Secondary | ICD-10-CM

## 2019-07-20 DIAGNOSIS — Z950 Presence of cardiac pacemaker: Secondary | ICD-10-CM | POA: Insufficient documentation

## 2019-07-20 DIAGNOSIS — R1031 Right lower quadrant pain: Secondary | ICD-10-CM | POA: Diagnosis not present

## 2019-07-20 DIAGNOSIS — K5900 Constipation, unspecified: Secondary | ICD-10-CM | POA: Insufficient documentation

## 2019-07-20 DIAGNOSIS — M25551 Pain in right hip: Secondary | ICD-10-CM

## 2019-07-20 MED ORDER — AMOXICILLIN 500 MG PO CAPS: 2000.0000 mg | ORAL_CAPSULE | Freq: Once | ORAL | 1 refills | Status: AC

## 2019-07-20 MED ORDER — ALPRAZOLAM 0.5 MG PO TABS: 0.5000 mg | ORAL_TABLET | Freq: Two times a day (BID) | ORAL | 0 refills | Status: DC | PRN

## 2019-07-20 MED ORDER — LINACLOTIDE 290 MCG PO CAPS: 290.0000 ug | ORAL_CAPSULE | Freq: Every day | ORAL | 0 refills | Status: DC

## 2019-07-20 NOTE — Progress Notes (Signed)
Virtual Visit via Telephone Note  I connected with Laurie [name] on 07/20/19 at  1:40 PM EDT by telephone and verified that I am speaking with the correct person using two identifiers.   I discussed the limitations, risks, security and privacy concerns of performing an evaluation and management service by telephone and the availability of in person appointments. I also discussed with the patient that there may be a patient responsible charge related to this service. The patient expressed understanding and agreed to proceed.      Established Patient Office Visit  Subjective:  Patient ID: Laurie [name], female    DOB: 03-19-1935  Age: 84 y.o. MRN: [id number]   CC:  Chief Complaint  Patient presents with  . Hip Pain    4 wk f/u    HPI Laurie [name] presents for right lower quadrant abdominal pain.  It is been confusing because sometimes she points more to her abdomen sometimes she says it is more in her hip and radiates downward.  She did have a CT of the abdomen and pelvis performed this past year which showed no evidence for acute intra-abdominal or pelvic abnormality she did have a large volume of stool, 2 cm left adrenal adenoma and a 3.9 cm abdominal aortic aneurysm which requires follow-up ultrasound in 2 years.  Did have a virtual consultation with Dr. Wilfrid Lund, gastroenterology back in May.  At the time he just had her increase her MiraLAX to twice a day and make sure she was taking it at least once a day. She just really feels like the MiraLAX is not helping.  She says her daughter brought her some things over-the-counter to try.  She is open to trying something new.  Her pain is still mostly on that right side from the right upper quadrant down to the right lower quadrant.  She says even with the MiraLAX her stools just look stringy and she is been taking 1 capful every night.  And even going up to twice a day some days but sometimes just forgets to take the second  dose.  She is also asking for refill on her alprazolam.  She says she still has some left but just wants to go ahead and call in so she does not have to make an extra trip to the pharmacy.  Says most the time she takes a half of a pill and that seems to work well.  She also is having several teeth extracted with her dentist and would like prophylaxis sent to the pharmacy since she does have an aortic heart valve replacement.  Past Medical History:  Diagnosis Date  . AAA (abdominal aortic aneurysm) ([place])   . Anemia    iron deficiency  . Ascending aorta dilatation ([facility])   . Atrial fibrillation ([place])    post op  . CAD (coronary artery disease)   . Carcinoma of lung ([place]) 08/28/2008   T2N0 moderately diff. squamous cell CA  . Cerebrovascular disease   . COPD (chronic obstructive pulmonary disease) ([place]) 2005   on CXR with R apical scaring  . Headache(784.0)    Hx: of migraines in the past  . Hypercholesterolemia    Hx: of  . Hypertension   . Incidental pulmonary nodule, > 32mm and < 46mm    LLL  . S/P aortic valve replacement 08/28/2008   [facility]  . S/P lobectomy of lung 08/28/2008   rt middle lobe  dr. Roxy Manns  . Shingles 11-09  Past Surgical History:  Procedure Laterality Date  . ABDOMINAL SURGERY    . AORTIC VALVE REPLACEMENT  08/28/2008   #67mm [facility] Ease pericardial tissue valve  . APPENDECTOMY    . CARDIAC CATHETERIZATION    . CATARACT EXTRACTION W/ INTRAOCULAR LENS  IMPLANT, BILATERAL Bilateral   . CHOLECYSTECTOMY N/A 12/13/2013   Procedure: LAPAROSCOPIC CHOLECYSTECTOMY;  Surgeon: Ralene Ok, MD;  Location: [facility];  Service: General;  Laterality: N/A;  . COLONOSCOPY     \  . DILATION AND CURETTAGE OF UTERUS    . ERCP N/A 05/08/2015   Procedure: ENDOSCOPIC RETROGRADE CHOLANGIOPANCREATOGRAPHY (ERCP);  Surgeon: Inda Castle, MD;  Location: [place];  Service: Endoscopy;  Laterality: N/A;  . ERCP N/A 01/13/2016   Procedure: ENDOSCOPIC RETROGRADE  CHOLANGIOPANCREATOGRAPHY (ERCP);  Surgeon: Doran Stabler, MD;  Location: [facility];  Service: Endoscopy;  Laterality: N/A;  . ERCP N/A 03/09/2016   Procedure: ENDOSCOPIC RETROGRADE CHOLANGIOPANCREATOGRAPHY (ERCP);  Surgeon: Ladene Artist, MD;  Location: [facility];  Service: Endoscopy;  Laterality: N/A;  . PACEMAKER IMPLANT N/A 12/05/2018   Procedure: PACEMAKER IMPLANT;  Surgeon: Evans Lance, MD;  Location: [facility];  Service: Cardiovascular;  Laterality: N/A;  . RML removed Dr Ricard Dillon for lung cancer  08/28/2008   T2N0 squamous cell CA  . TONSILLECTOMY AND ADENOIDECTOMY    . u/s guided aspiration of R breast cyst   2009   at the breast clinic    Family History  Problem Relation Age of Onset  . Stroke Father   . Diabetes Son        brittle diabetes  . Colon polyps Neg Hx   . Colon cancer Neg Hx   . Esophageal cancer Neg Hx   . Stomach cancer Neg Hx   . Pancreatic cancer Neg Hx   . Kidney disease Neg Hx   . Liver disease Neg Hx     Social History   Socioeconomic History  . Marital status: Single    Spouse name: Not on file  . Number of children: Not on file  . Years of education: Not on file  . Highest education level: Not on file  Occupational History  . Not on file  Social Needs  . Financial resource strain: Not on file  . Food insecurity    Worry: Not on file    Inability: Not on file  . Transportation needs    Medical: Not on file    Non-medical: Not on file  Tobacco Use  . Smoking status: Former Smoker    Years: 40.00  . Smokeless tobacco: Never Used  Substance and Sexual Activity  . Alcohol use: No    Alcohol/week: 0.0 standard drinks  . Drug use: No  . Sexual activity: Not on file  Lifestyle  . Physical activity    Days per week: Not on file    Minutes per session: Not on file  . Stress: Not on file  Relationships  . Social Herbalist on phone: Not on file    Gets together: Not on file    Attends religious service: Not  on file    Active member of club or organization: Not on file    Attends meetings of clubs or organizations: Not on file    Relationship status: Not on file  . Intimate partner violence    Fear of current or ex partner: Not on file    Emotionally abused: Not on file  Physically abused: Not on file    Forced sexual activity: Not on file  Other Topics Concern  . Not on file  Social History Narrative  . Not on file    Outpatient Medications Prior to Visit  Medication Sig Dispense Refill  . acetaminophen (TYLENOL) 500 MG tablet Take 2 tablets (1,000 mg total) by mouth every 8 (eight) hours as needed (pain). 30 tablet 0  . albuterol (PROVENTIL) (2.5 MG/3ML) 0.083% nebulizer solution Take 3 mLs (2.5 mg total) by nebulization every 4 (four) hours as needed for wheezing or shortness of breath. 30 vial 0  . amiodarone (PACERONE) 200 MG tablet Take 1 tablet (200 mg total) by mouth daily. 90 tablet 3  . furosemide (LASIX) 20 MG tablet Take 1 tablet (20 mg total) by mouth as needed for edema (for leg swelling). 90 tablet 0  . HYDROcodone-acetaminophen (NORCO/VICODIN) 5-325 MG tablet Take 1 tablet by mouth 3 (three) times daily as needed for moderate pain. 15 tablet 0  . lisinopril (PRINIVIL,ZESTRIL) 20 MG tablet Take 1 tablet (20 mg total) by mouth daily. 90 tablet 3  . polyethylene glycol powder (GLYCOLAX/MIRALAX) powder Take 17 g by mouth daily. (Patient taking differently: Take 17 g by mouth daily as needed (constipation). ) 250 g 2  . XARELTO 20 MG TABS tablet TAKE 1 TABLET (20 MG TOTAL) BY MOUTH DAILY WITH SUPPER. 90 tablet 0  . ALPRAZolam (XANAX) 0.5 MG tablet TAKE 1 TABLET (0.5 MG TOTAL) BY MOUTH 2 (TWO) TIMES DAILY AS NEEDED FOR ANXIETY. TAKE 0.5-1 TABLETS (0.25-5 MG TOTAL) BY MOUTH 2 (TWO) TIMES DAILY AS NEEDED. 60 tablet 0   No facility-administered medications prior to visit.     Allergies  Allergen Reactions  . Hyoscyamine Other (See Comments)    Mental status changes.  (as of  02/13/16 patient denies any problems with this medication)  . Rofecoxib Anaphylaxis and Hives  . Statins Other (See Comments)    Muscle aches  . Metoprolol Other (See Comments)    Extreme fatigue, weakness and brady    ROS Review of Systems    Objective:    Physical Exam  Temp 97.6 F (36.4 C)   Ht 5\' 6"  (1.676 m)   SpO2 95%   BMI 26.47 kg/m  Wt Readings from Last 3 Encounters:  07/03/19 164 lb (74.4 kg)  06/22/19 167 lb (75.8 kg)  06/05/19 169 lb (76.7 kg)     Health Maintenance Due  Topic Date Due  . PNA vac Low Risk Adult (2 of 2 - PCV13) 07/31/2009  . INFLUENZA VACCINE  07/01/2019    There are no preventive care reminders to display for this patient.  Lab Results  Component Value Date   TSH 2.86 04/04/2019   Lab Results  Component Value Date   WBC 7.3 06/22/2019   HGB 14.9 06/22/2019   HCT 44.4 06/22/2019   MCV 85.7 06/22/2019   PLT 273 06/22/2019   Lab Results  Component Value Date   NA 130 (L) 06/22/2019   K 4.2 06/22/2019   CO2 27 06/22/2019   GLUCOSE 88 06/22/2019   BUN 12 06/22/2019   CREATININE 0.88 06/22/2019   BILITOT 0.6 04/04/2019   ALKPHOS 92 01/17/2019   AST 21 04/04/2019   ALT 21 04/04/2019   PROT 6.6 04/04/2019   ALBUMIN 3.8 01/17/2019   CALCIUM 9.7 06/22/2019   ANIONGAP 9 02/07/2019   GFR 87.46 04/25/2015   Lab Results  Component Value Date   CHOL 291 (H) 09/18/2010  Lab Results  Component Value Date   HDL 38 (L) 09/18/2010   Lab Results  Component Value Date   LDLCALC 191 (H) 09/18/2010   Lab Results  Component Value Date   TRIG 308 (H) 09/18/2010   Lab Results  Component Value Date   CHOLHDL 7.7 Ratio 09/18/2010   Lab Results  Component Value Date   HGBA1C 6.2 (H) 04/18/2015      Assessment & Plan:   Problem List Items Addressed This Visit      Other   S/P aortic valve replacement    Called in amoxicillin for dental prophylaxis since she is having several teeth extracted.      Relevant  Medications   amoxicillin (AMOXIL) 500 MG capsule   GAD (generalized anxiety disorder)   Relevant Medications   ALPRAZolam (XANAX) 0.5 MG tablet   Constipation - Primary    Discussed options.  She has not ever tried anything like Linzess or Amitiza I think would be a reasonable option especially if she feels like she still not getting great results with the MiraLAX.  She feels like when she does go she is just noticing stringy type stools.  She is willing to try the [place] but only wants a 15-day supply sent to her pharmacy in the meantime she will try to get back in with Dr. Loletha Carrow.      Relevant Medications   linaclotide (LINZESS) 290 MCG CAPS capsule   Chronic RLQ pain    Chronic right lower quadrant pain-I think it is worth trying something like Linzess or Amitiza.  If that is not effective then consider something for more nerve type pain.      Chronic right hip pain      Meds ordered this encounter  Medications  . linaclotide (LINZESS) 290 MCG CAPS capsule    Sig: Take 1 capsule (290 mcg total) by mouth daily before breakfast.    Dispense:  15 capsule    Refill:  0  . ALPRAZolam (XANAX) 0.5 MG tablet    Sig: Take 1 tablet (0.5 mg total) by mouth 2 (two) times daily as needed for anxiety. TAKE 0.5-1 TABLETS (0.25-5 MG TOTAL) BY MOUTH 2 (TWO) TIMES DAILY AS NEEDED.    Dispense:  60 tablet    Refill:  0    Not to exceed 5 additional fills before 10/29/2019  . amoxicillin (AMOXIL) 500 MG capsule    Sig: Take 4 capsules (2,000 mg total) by mouth once for 1 dose. Before dental procedure    Dispense:  4 capsule    Refill:  1    Follow-up: No follow-ups on file.     I discussed the assessment and treatment plan with the patient. The patient was provided an opportunity to ask questions and all were answered. The patient agreed with the plan and demonstrated an understanding of the instructions.   The patient was advised to call back or seek an in-person evaluation if the symptoms  worsen or if the condition fails to improve as anticipated.  I provided 23 minutes of non-face-to-face time during this encounter.    Beatrice Lecher, MD

## 2019-07-20 NOTE — Assessment & Plan Note (Signed)
Called in amoxicillin for dental prophylaxis since she is having several teeth extracted.

## 2019-07-20 NOTE — Assessment & Plan Note (Signed)
Discussed options.  She has not ever tried anything like Linzess or Amitiza I think would be a reasonable option especially if she feels like she still not getting great results with the MiraLAX.  She feels like when she does go she is just noticing stringy type stools.  She is willing to try the [place] but only wants a 15-day supply sent to her pharmacy in the meantime she will try to get back in with Dr. Loletha Carrow.

## 2019-07-20 NOTE — Assessment & Plan Note (Signed)
Chronic right lower quadrant pain-I think it is worth trying something like Linzess or Amitiza.  If that is not effective then consider something for more nerve type pain.

## 2019-07-20 NOTE — Progress Notes (Signed)
Stomach pain. "I just feel so bad. I can't get thru the house". "I feel good when I first get up but then I begin to feel bad".  Feels swollen and hurts up towards where her GB was. She has called Dr. Pincus Sanes and has not been able to get in with him. She hasn't felt like eating.  She stated that she hasn't had a good BM and stated that there is a lump on her rectum and said that something is wrong like something has "shifted" .Laurie [name], [place]

## 2019-07-21 ENCOUNTER — Telehealth: Payer: Self-pay

## 2019-07-21 NOTE — Telephone Encounter (Signed)
The cost of Linzess was $150. She can't afford the medication.

## 2019-07-24 NOTE — Telephone Encounter (Signed)
Laurie [name] called back this morning and states the pharmacy misquoted the price. She was able to pick it up.

## 2019-07-25 ENCOUNTER — Encounter: Payer: Medicare HMO | Admitting: Physician Assistant

## 2019-07-28 ENCOUNTER — Telehealth: Payer: Self-pay

## 2019-07-28 NOTE — Telephone Encounter (Signed)
Patient called stating that Linzess is not working. Patient states that she has been taking it for a week, once daily 30 minutes prior to breakfast. She states she has had a couple small bowel movements, but no "complete" bowel movements. Patient states that back has started hurting and a lot of stomach pain, internal and tender to the touch. Feels very bloated. Patient reports eating 3 small meals a day. Drinking about 3 "big tall glasses" of water daily.   Requesting pain medication.Marland Kitchen

## 2019-07-28 NOTE — Telephone Encounter (Signed)
Patient called again, states that she is in a lot of pain and really needs pain meds. Patient states that she refuses to go to ER or Urgent Care- states that all she needs in pain medication.   I advised patient that Dr Madilyn Fireman is in clinic and if she is in extreme unbearable pain, she should be seen. Patient states she is just going to wait and see if Dr Madilyn Fireman calls her in any pain meds

## 2019-07-29 MED ORDER — HYDROCODONE-ACETAMINOPHEN 5-325 MG PO TABS: 1.0000 | ORAL_TABLET | Freq: Three times a day (TID) | ORAL | 0 refills | Status: DC | PRN

## 2019-07-29 NOTE — Telephone Encounter (Signed)
rx sent.  I would like to try her on gabapenting if she hasn't already  It helps turn down the pain that is being signaled frm the nerve.  See if she has tried that before.

## 2019-07-31 NOTE — Telephone Encounter (Signed)
Called and left pt msg advising RX sent. Asked her to call back and let us know if she has ever taken Gabapentin

## 2019-08-01 NOTE — Telephone Encounter (Addendum)
Patient states she does not want to take anything new. She was upset she started new medication (Linzess) because it was expensive and did not work- it made her feel worse.  Patient is trying to reach out to Dr Loletha Carrow to see if he can help with this. She has an appt with him on 08/29/19

## 2019-08-10 ENCOUNTER — Telehealth (INDEPENDENT_AMBULATORY_CARE_PROVIDER_SITE_OTHER): Payer: Medicare HMO

## 2019-08-10 DIAGNOSIS — R109 Unspecified abdominal pain: Secondary | ICD-10-CM | POA: Diagnosis not present

## 2019-08-10 NOTE — Telephone Encounter (Signed)
Laurie [name] called and left a message asking for a refill on Hydrocodone. She states her stomach is still hurting and she can't sleep.

## 2019-08-10 NOTE — Telephone Encounter (Signed)
Reviewed most recent office visit note 07/20/19.  Patient was complaining of constipation, Dr. Suzi Roots did not prescribe any opiates at that time.  Looks like the pain medication was sent via telephone encounter 07/29/19 but then asked if the patient would be okay to try gabapentin, it does not sound like Dr. Jerilynn Mages wanted the opiate pain medication to be a long-term thing.  It looks like the patient has also had issue with the [place].    I am not going to refill opiate pain medications at this time since I do not have evidence that this would be what Dr Jerilynn Mages would do based on my review of records, and certainly could make constipation worse.  If patient has issue with this, she should schedule visit with PCP or seek in person evaluation here or urgent care if her pain has worsened or changed.  5 mins spent

## 2019-08-11 ENCOUNTER — Ambulatory Visit (INDEPENDENT_AMBULATORY_CARE_PROVIDER_SITE_OTHER): Payer: Medicare HMO | Admitting: Family Medicine

## 2019-08-11 ENCOUNTER — Encounter: Payer: Self-pay | Admitting: Family Medicine

## 2019-08-11 ENCOUNTER — Other Ambulatory Visit: Payer: Self-pay

## 2019-08-11 VITALS — BP 127/67 | HR 78 | Temp 98.0°F | Ht 66.0 in | Wt 162.5 lb

## 2019-08-11 DIAGNOSIS — C342 Malignant neoplasm of middle lobe, bronchus or lung: Secondary | ICD-10-CM | POA: Diagnosis not present

## 2019-08-11 DIAGNOSIS — R1031 Right lower quadrant pain: Secondary | ICD-10-CM | POA: Diagnosis not present

## 2019-08-11 LAB — POCT URINALYSIS DIPSTICK
Bilirubin, UA: NEGATIVE
Blood, UA: NEGATIVE
Glucose, UA: NEGATIVE
Ketones, UA: NEGATIVE
Leukocytes, UA: NEGATIVE
Nitrite, UA: NEGATIVE
Protein, UA: POSITIVE — AB
Spec Grav, UA: 1.015 (ref 1.010–1.025)
Urobilinogen, UA: 0.2 E.U./dL
pH, UA: 7 (ref 5.0–8.0)

## 2019-08-11 MED ORDER — HYDROCODONE-ACETAMINOPHEN 5-325 MG PO TABS: 1.0000 | ORAL_TABLET | Freq: Three times a day (TID) | ORAL | 0 refills | Status: DC | PRN

## 2019-08-11 NOTE — Patient Instructions (Signed)
Thank you for coming in today. Get labs now.  Prepare for ct scan early next week or over the weekend if needed.  If a lot worse emergency room.  Keep me updated.  We may do more tests if not getting better quickly.    If your belly pain worsens, or you have high fever, bad vomiting, blood in your stool or black tarry stool go to the Emergency Room.

## 2019-08-11 NOTE — Progress Notes (Signed)
Laurie [name] is a 84 y.o. female who presents to [facility]: [place] today for abdominal pain.  Patient has had a few months to a year of mild right lower quadrant abdominal pain occurring off and on.  She notes a few days ago it worsened.  She denies any urinary frequency urgency or dysuria.  She denies any history of kidney stones.  Her past surgical history is significant for appendectomy and cholecystectomy.  She has a follow-up appointment scheduled with her gastroenterologist in a month.  She denies any vomiting or diarrhea or constipation.  She can eat normally.  She also notes that she has quite a bit of hip pain due to DJD and would like a short-term supply of hydrocodone if possible.  She was prescribed this somewhat recently by her PCP. ROS as above:  Exam:  BP 127/67   Pulse 78   Temp 98 F (36.7 C) (Oral)   Ht 5\' 6"  (1.676 m)   Wt 162 lb 8 oz (73.7 kg)   BMI 26.23 kg/m  Wt Readings from Last 5 Encounters:  08/11/19 162 lb 8 oz (73.7 kg)  07/03/19 164 lb (74.4 kg)  06/22/19 167 lb (75.8 kg)  06/05/19 169 lb (76.7 kg)  05/02/19 168 lb (76.2 kg)    Gen: Well NAD HEENT: EOMI,  MMM Lungs: Normal work of breathing. CTABL Heart: RRR no MRG Abd: NABS, Soft. Nondistended, minimally tender right lower quadrant with no rebound or guarding.  Well-appearing scars abdomen.  No masses palpated. No CVA angle tenderness to percussion. MSK: Antalgic gait. Exts: Brisk capillary refill, warm and well perfused.   Lab and Radiology Results Results for orders placed or performed in visit on 08/11/19 (from the past 72 hour(s))  POCT urinalysis dipstick     Status: Abnormal   Collection Time: 08/11/19 10:26 AM  Result Value Ref Range   Color, UA Yellow    Clarity, UA Clear    Glucose, UA Negative Negative   Bilirubin, UA Negative    Ketones, UA Negative    Spec  Grav, UA 1.015 1.010 - 1.025   Blood, UA Negative    pH, UA 7.0 5.0 - 8.0   Protein, UA Positive (A) Negative   Urobilinogen, UA 0.2 0.2 or 1.0 E.U./dL   Nitrite, UA Negative    Leukocytes, UA Negative Negative   Appearance Clear    Odor Odorless    No results found.      Assessment and Plan: 84 y.o. female with  Abdominal pain.  Exacerbation of chronic pain.  Etiology at this time remains unclear.  Urine etiology less likely.  Urinalysis today was largely normal.  Patient does have a history of malignancy lung cancer about 10 years ago.  She has been in remission since with no recurrence.  Plan for limited lab work-up listed below followed by CT scan likely.  Likely will do CT scan early next week.  May proceed with scan sooner if needed.  Precautions reviewed.  Recheck sooner if needed.  ED precautions reviewed.  Limited hydrocodone for hip pain.  Will recheck patient near future for this issue if needed.  PDMP reviewed during this encounter. Orders Placed This Encounter  Procedures  . CT Abdomen Pelvis W Contrast    Standing Status:   Future    Standing Expiration Date:   11/09/2020    Order Specific Question:   If indicated for the ordered procedure, I  authorize the administration of contrast media per Radiology protocol    Answer:   Yes    Order Specific Question:   Preferred imaging location?    Answer:   [facility]    Order Specific Question:   Is Oral Contrast requested for this exam?    Answer:   Yes, Per Radiology protocol    Order Specific Question:   Radiology Contrast Protocol - do NOT remove file path    Answer:   [email or web address]  . CBC with Differential/Platelet  . COMPLETE METABOLIC PANEL WITH GFR  . Lipase  . POCT urinalysis dipstick   Meds ordered this encounter  Medications  . HYDROcodone-acetaminophen (NORCO/VICODIN) 5-325 MG tablet    Sig: Take 1 tablet by mouth 3 (three) times daily as needed for moderate pain.     Dispense:  15 tablet    Refill:  0     Historical information moved to improve visibility of documentation.  Past Medical History:  Diagnosis Date  . AAA (abdominal aortic aneurysm) ([place])   . Anemia    iron deficiency  . Ascending aorta dilatation ([facility])   . Atrial fibrillation ([place])    post op  . CAD (coronary artery disease)   . Carcinoma of lung ([place]) 08/28/2008   T2N0 moderately diff. squamous cell CA  . Cerebrovascular disease   . COPD (chronic obstructive pulmonary disease) ([place]) 2005   on CXR with R apical scaring  . Headache(784.0)    Hx: of migraines in the past  . Hypercholesterolemia    Hx: of  . Hypertension   . Incidental pulmonary nodule, > 28mm and < 62mm    LLL  . S/P aortic valve replacement 08/28/2008   [facility]  . S/P lobectomy of lung 08/28/2008   rt middle lobe  dr. Roxy Manns  . Shingles 11-09   Past Surgical History:  Procedure Laterality Date  . ABDOMINAL SURGERY    . AORTIC VALVE REPLACEMENT  08/28/2008   #38mm [facility] Ease pericardial tissue valve  . APPENDECTOMY    . CARDIAC CATHETERIZATION    . CATARACT EXTRACTION W/ INTRAOCULAR LENS  IMPLANT, BILATERAL Bilateral   . CHOLECYSTECTOMY N/A 12/13/2013   Procedure: LAPAROSCOPIC CHOLECYSTECTOMY;  Surgeon: Ralene Ok, MD;  Location: [place];  Service: General;  Laterality: N/A;  . COLONOSCOPY     \  . DILATION AND CURETTAGE OF UTERUS    . ERCP N/A 05/08/2015   Procedure: ENDOSCOPIC RETROGRADE CHOLANGIOPANCREATOGRAPHY (ERCP);  Surgeon: Inda Castle, MD;  Location: [facility];  Service: Endoscopy;  Laterality: N/A;  . ERCP N/A 01/13/2016   Procedure: ENDOSCOPIC RETROGRADE CHOLANGIOPANCREATOGRAPHY (ERCP);  Surgeon: Doran Stabler, MD;  Location: [facility];  Service: Endoscopy;  Laterality: N/A;  . ERCP N/A 03/09/2016   Procedure: ENDOSCOPIC RETROGRADE CHOLANGIOPANCREATOGRAPHY (ERCP);  Surgeon: Ladene Artist, MD;  Location: [facility];  Service: Endoscopy;  Laterality: N/A;  . PACEMAKER  IMPLANT N/A 12/05/2018   Procedure: PACEMAKER IMPLANT;  Surgeon: Evans Lance, MD;  Location: [place] [facility];  Service: Cardiovascular;  Laterality: N/A;  . RML removed Dr Ricard Dillon for lung cancer  08/28/2008   T2N0 squamous cell CA  . TONSILLECTOMY AND ADENOIDECTOMY    . u/s guided aspiration of R breast cyst   2009   at the breast clinic   Social History   Tobacco Use  . Smoking status: Former Smoker    Years: 40.00  . Smokeless tobacco: Never Used  Substance Use Topics  . Alcohol use:  No    Alcohol/week: 0.0 standard drinks   family history includes Diabetes in her son; Stroke in her father.  Medications: Current Outpatient Medications  Medication Sig Dispense Refill  . acetaminophen (TYLENOL) 500 MG tablet Take 2 tablets (1,000 mg total) by mouth every 8 (eight) hours as needed (pain). 30 tablet 0  . albuterol (PROVENTIL) (2.5 MG/3ML) 0.083% nebulizer solution Take 3 mLs (2.5 mg total) by nebulization every 4 (four) hours as needed for wheezing or shortness of breath. 30 vial 0  . ALPRAZolam (XANAX) 0.5 MG tablet Take 1 tablet (0.5 mg total) by mouth 2 (two) times daily as needed for anxiety. TAKE 0.5-1 TABLETS (0.25-5 MG TOTAL) BY MOUTH 2 (TWO) TIMES DAILY AS NEEDED. 60 tablet 0  . amiodarone (PACERONE) 200 MG tablet Take 1 tablet (200 mg total) by mouth daily. 90 tablet 3  . furosemide (LASIX) 20 MG tablet Take 1 tablet (20 mg total) by mouth as needed for edema (for leg swelling). 90 tablet 0  . HYDROcodone-acetaminophen (NORCO/VICODIN) 5-325 MG tablet Take 1 tablet by mouth 3 (three) times daily as needed for moderate pain. 15 tablet 0  . linaclotide (LINZESS) 290 MCG CAPS capsule Take 1 capsule (290 mcg total) by mouth daily before breakfast. 15 capsule 0  . lisinopril (PRINIVIL,ZESTRIL) 20 MG tablet Take 1 tablet (20 mg total) by mouth daily. 90 tablet 3  . polyethylene glycol powder (GLYCOLAX/MIRALAX) powder Take 17 g by mouth daily. (Patient taking differently: Take 17 g  by mouth daily as needed (constipation). ) 250 g 2  . XARELTO 20 MG TABS tablet TAKE 1 TABLET (20 MG TOTAL) BY MOUTH DAILY WITH SUPPER. 90 tablet 0   No current facility-administered medications for this visit.    Allergies  Allergen Reactions  . Hyoscyamine Other (See Comments)    Mental status changes.  (as of 02/13/16 patient denies any problems with this medication)  . Rofecoxib Anaphylaxis and Hives  . Statins Other (See Comments)    Muscle aches  . Metoprolol Other (See Comments)    Extreme fatigue, weakness and brady     Discussed warning signs or symptoms. Please see discharge instructions. Patient expresses understanding.

## 2019-08-11 NOTE — Telephone Encounter (Signed)
Patient advised and scheduled.  

## 2019-08-12 LAB — CBC WITH DIFFERENTIAL/PLATELET
Absolute Monocytes: 967 cells/uL — ABNORMAL HIGH (ref 200–950)
Basophils Absolute: 39 cells/uL (ref 0–200)
Basophils Relative: 0.5 %
Eosinophils Absolute: 62 cells/uL (ref 15–500)
Eosinophils Relative: 0.8 %
HCT: 45.3 % — ABNORMAL HIGH (ref 35.0–45.0)
Hemoglobin: 15.3 g/dL (ref 11.7–15.5)
Lymphs Abs: 975 cells/uL (ref 850–3900)
MCH: 29.7 pg (ref 27.0–33.0)
MCHC: 33.8 g/dL (ref 32.0–36.0)
MCV: 87.8 fL (ref 80.0–100.0)
MPV: 10.2 fL (ref 7.5–12.5)
Monocytes Relative: 12.4 %
Neutro Abs: 5756 cells/uL (ref 1500–7800)
Neutrophils Relative %: 73.8 %
Platelets: 244 10*3/uL (ref 140–400)
RBC: 5.16 10*6/uL — ABNORMAL HIGH (ref 3.80–5.10)
RDW: 13.7 % (ref 11.0–15.0)
Total Lymphocyte: 12.5 %
WBC: 7.8 10*3/uL (ref 3.8–10.8)

## 2019-08-12 LAB — COMPLETE METABOLIC PANEL WITH GFR
AG Ratio: 1.9 (calc) (ref 1.0–2.5)
ALT: 20 U/L (ref 6–29)
AST: 20 U/L (ref 10–35)
Albumin: 4.3 g/dL (ref 3.6–5.1)
Alkaline phosphatase (APISO): 81 U/L (ref 37–153)
BUN: 9 mg/dL (ref 7–25)
CO2: 27 mmol/L (ref 20–32)
Calcium: 9.6 mg/dL (ref 8.6–10.4)
Chloride: 90 mmol/L — ABNORMAL LOW (ref 98–110)
Creat: 0.87 mg/dL (ref 0.60–0.88)
GFR, Est African American: 72 mL/min/{1.73_m2} (ref >=60)
GFR, Est Non African American: 62 mL/min/{1.73_m2} (ref >=60)
Globulin: 2.3 g/dL (calc) (ref 1.9–3.7)
Glucose, Bld: 114 mg/dL — ABNORMAL HIGH (ref 65–99)
Potassium: 4.4 mmol/L (ref 3.5–5.3)
Sodium: 129 mmol/L — ABNORMAL LOW (ref 135–146)
Total Bilirubin: 0.9 mg/dL (ref 0.2–1.2)
Total Protein: 6.6 g/dL (ref 6.1–8.1)

## 2019-08-12 LAB — LIPASE: Lipase: 22 U/L (ref 7–60)

## 2019-08-20 NOTE — Progress Notes (Signed)
HPI: FU atrial fibrillation, CAD, ho aortic stenosis status post aortic valve replacement in September 2009. She had a pericardial tissue valve; also had a septal myectomy and a right middle lobectomy due to lung CA. Note, she had a 50% mid LAD by catheterization preoperatively. Carotid Dopplers November 2017 showed mild bilateral atherosclerosis and no hemodynamically significant stenosis. Dopplers 5/18: ABI of 0.57 on the right and 0.76 on the left. Waveforms were suggestive of inflow disease. Duplex showed significant distal aortic and bilateral common iliac artery stenosis with moderate left SFA stenosis.Seen by Dr Fletcher Anon and medical therapy recommended for now.Amiodarone added at previousov for recurrent atrial fibrillation. Patient was admitted January 2020 with recurrent atrial fibrillation and was noted to have posttermination pauses.  Pacemaker was placed.  Chest CT January 2020 showed 4.6 cm ascending thoracic aortic aneurysm.  Abdominal CT February 2020 showed 3.9 cm abdominal aortic aneurysm.    Echocardiogram repeated May 2020 and showed vigorous LV function.  Bioprosthetic aortic valve not well visualized but gradient is normal.  Since I last saw her,she has some dyspnea but denies orthopnea, PND, chest pain or syncope.  She has some dizziness with standing.  Current Outpatient Medications  Medication Sig Dispense Refill  . acetaminophen (TYLENOL) 500 MG tablet Take 2 tablets (1,000 mg total) by mouth every 8 (eight) hours as needed (pain). 30 tablet 0  . albuterol (PROVENTIL) (2.5 MG/3ML) 0.083% nebulizer solution Take 3 mLs (2.5 mg total) by nebulization every 4 (four) hours as needed for wheezing or shortness of breath. 30 vial 0  . ALPRAZolam (XANAX) 0.5 MG tablet Take 1 tablet (0.5 mg total) by mouth 2 (two) times daily as needed for anxiety. TAKE 0.5-1 TABLETS (0.25-5 MG TOTAL) BY MOUTH 2 (TWO) TIMES DAILY AS NEEDED. 60 tablet 0  . amiodarone (PACERONE) 200 MG tablet Take 1  tablet (200 mg total) by mouth daily. 90 tablet 3  . furosemide (LASIX) 20 MG tablet Take 1 tablet (20 mg total) by mouth as needed for edema (for leg swelling). 90 tablet 0  . HYDROcodone-acetaminophen (NORCO/VICODIN) 5-325 MG tablet Take 1 tablet by mouth 3 (three) times daily as needed for moderate pain. 15 tablet 0  . linaclotide (LINZESS) 290 MCG CAPS capsule Take 1 capsule (290 mcg total) by mouth daily before breakfast. 15 capsule 0  . lisinopril (PRINIVIL,ZESTRIL) 20 MG tablet Take 1 tablet (20 mg total) by mouth daily. 90 tablet 3  . polyethylene glycol powder (GLYCOLAX/MIRALAX) powder Take 17 g by mouth daily. (Patient taking differently: Take 17 g by mouth daily as needed (constipation). ) 250 g 2  . XARELTO 20 MG TABS tablet TAKE 1 TABLET (20 MG TOTAL) BY MOUTH DAILY WITH SUPPER. 90 tablet 0   No current facility-administered medications for this visit.      Past Medical History:  Diagnosis Date  . AAA (abdominal aortic aneurysm) ([place])   . Anemia    iron deficiency  . Ascending aorta dilatation ([facility])   . Atrial fibrillation ([place])    post op  . CAD (coronary artery disease)   . Carcinoma of lung ([place]) 08/28/2008   T2N0 moderately diff. squamous cell CA  . Cerebrovascular disease   . COPD (chronic obstructive pulmonary disease) ([place]) 2005   on CXR with R apical scaring  . Headache(784.0)    Hx: of migraines in the past  . Hypercholesterolemia    Hx: of  . Hypertension   . Incidental pulmonary nodule, > 52mm and <  81mm    LLL  . S/P aortic valve replacement 08/28/2008   [facility]  . S/P lobectomy of lung 08/28/2008   rt middle lobe  dr. Roxy Manns  . Shingles 11-09    Past Surgical History:  Procedure Laterality Date  . ABDOMINAL SURGERY    . AORTIC VALVE REPLACEMENT  08/28/2008   #82mm [facility] Ease pericardial tissue valve  . APPENDECTOMY    . CARDIAC CATHETERIZATION    . CATARACT EXTRACTION W/ INTRAOCULAR LENS  IMPLANT, BILATERAL Bilateral   . CHOLECYSTECTOMY N/A  12/13/2013   Procedure: LAPAROSCOPIC CHOLECYSTECTOMY;  Surgeon: Ralene Ok, MD;  Location: [facility];  Service: General;  Laterality: N/A;  . COLONOSCOPY     \  . DILATION AND CURETTAGE OF UTERUS    . ERCP N/A 05/08/2015   Procedure: ENDOSCOPIC RETROGRADE CHOLANGIOPANCREATOGRAPHY (ERCP);  Surgeon: Inda Castle, MD;  Location: [facility];  Service: Endoscopy;  Laterality: N/A;  . ERCP N/A 01/13/2016   Procedure: ENDOSCOPIC RETROGRADE CHOLANGIOPANCREATOGRAPHY (ERCP);  Surgeon: Doran Stabler, MD;  Location: [facility];  Service: Endoscopy;  Laterality: N/A;  . ERCP N/A 03/09/2016   Procedure: ENDOSCOPIC RETROGRADE CHOLANGIOPANCREATOGRAPHY (ERCP);  Surgeon: Ladene Artist, MD;  Location: [facility];  Service: Endoscopy;  Laterality: N/A;  . PACEMAKER IMPLANT N/A 12/05/2018   Procedure: PACEMAKER IMPLANT;  Surgeon: Evans Lance, MD;  Location: [facility] [facility];  Service: Cardiovascular;  Laterality: N/A;  . RML removed Dr Ricard Dillon for lung cancer  08/28/2008   T2N0 squamous cell CA  . TONSILLECTOMY AND ADENOIDECTOMY    . u/s guided aspiration of R breast cyst   2009   at the breast clinic    Social History   Socioeconomic History  . Marital status: Single    Spouse name: Not on file  . Number of children: Not on file  . Years of education: Not on file  . Highest education level: Not on file  Occupational History  . Not on file  Social Needs  . Financial resource strain: Not on file  . Food insecurity    Worry: Not on file    Inability: Not on file  . Transportation needs    Medical: Not on file    Non-medical: Not on file  Tobacco Use  . Smoking status: Former Smoker    Years: 40.00  . Smokeless tobacco: Never Used  Substance and Sexual Activity  . Alcohol use: No    Alcohol/week: 0.0 standard drinks  . Drug use: No  . Sexual activity: Not on file  Lifestyle  . Physical activity    Days per week: Not on file    Minutes per session: Not on file  . Stress: Not on  file  Relationships  . Social Herbalist on phone: Not on file    Gets together: Not on file    Attends religious service: Not on file    Active member of club or organization: Not on file    Attends meetings of clubs or organizations: Not on file    Relationship status: Not on file  . Intimate partner violence    Fear of current or ex partner: Not on file    Emotionally abused: Not on file    Physically abused: Not on file    Forced sexual activity: Not on file  Other Topics Concern  . Not on file  Social History Narrative  . Not on file    Family History  Problem  Relation Age of Onset  . Stroke Father   . Diabetes Son        brittle diabetes  . Colon polyps Neg Hx   . Colon cancer Neg Hx   . Esophageal cancer Neg Hx   . Stomach cancer Neg Hx   . Pancreatic cancer Neg Hx   . Kidney disease Neg Hx   . Liver disease Neg Hx     ROS: no fevers or chills, productive cough, hemoptysis, dysphasia, odynophagia, melena, hematochezia, dysuria, hematuria, rash, seizure activity, orthopnea, PND, pedal edema, claudication. Remaining systems are negative.  Physical Exam: Well-developed well-nourished in no acute distress.  Skin is warm and dry.  HEENT is normal.  Neck is supple.  Chest is clear to auscultation with normal expansion.  Cardiovascular exam is regular rate and rhythm.  Abdominal exam nontender or distended. No masses palpated. Extremities show no edema. neuro grossly intact  ECG-sinus rhythm with PACs, nonspecific T wave changes.  Personally reviewed  A/P  1 paroxysmal atrial fibrillation-patient is in sinus rhythm today.  Continue amiodarone.  Continue Xarelto.  Check TSH and chest x-ray. Recent LFTs normal.  2 prior aortic valve replacement-normally functioning on most recent echocardiogram.  Continue SBE prophylaxis.  3 hypertension-blood pressure controlled.  However she is describing dizziness with standing.  I will decrease lisinopril to 5 mg  daily to see if this improves her symptoms.  4 hyperlipidemia-continue diet.  Intolerant to statins.  5 coronary artery disease-patient is not having chest pain.  She is not on aspirin given need for anticoagulation.  Intolerant to statins.  6 peripheral vascular disease-followed by Dr. Velva Harman.  7 history of thoracic, abdominal and iliac aneurysms- If patient is agreeable to intervention in the future we will plan to repeat CTA February 2021.  8 prior pacemaker-patient is unable to go to [place].  I will arrange follow-up with Dr. Curt Bears in [facility].  [name], MD

## 2019-08-21 ENCOUNTER — Telehealth: Payer: Self-pay

## 2019-08-21 ENCOUNTER — Other Ambulatory Visit: Payer: Medicare HMO

## 2019-08-21 NOTE — Telephone Encounter (Signed)
Pt left vm stating that she had to reschedule her CT to next Monday.  She is unable to drive to [facility] to have it done.  She is wanting know could she get some pain meds til next week. Please advise -EH/RMA

## 2019-08-22 MED ORDER — HYDROCODONE-ACETAMINOPHEN 5-325 MG PO TABS: 1.0000 | ORAL_TABLET | Freq: Three times a day (TID) | ORAL | 0 refills | Status: DC | PRN

## 2019-08-22 NOTE — Telephone Encounter (Signed)
Pt advised. She is getting the scan next week because the scanner needs work and the radiologist won't be back to read it until next week. She cannot drive or get transportation to [place] to have it done earlier.

## 2019-08-22 NOTE — Telephone Encounter (Signed)
Hydrocodone temporary refilled.  It is important to get your CT scan done so that we know what were dealing with.  Delaying CT scan could result in death or severe disability.  Please get this done.

## 2019-08-23 ENCOUNTER — Encounter: Payer: Self-pay | Admitting: Cardiology

## 2019-08-23 ENCOUNTER — Ambulatory Visit (INDEPENDENT_AMBULATORY_CARE_PROVIDER_SITE_OTHER): Payer: Medicare HMO | Admitting: Cardiology

## 2019-08-23 ENCOUNTER — Other Ambulatory Visit: Payer: Self-pay

## 2019-08-23 VITALS — BP 115/66 | HR 79 | Ht 66.0 in | Wt 164.1 lb

## 2019-08-23 DIAGNOSIS — I480 Paroxysmal atrial fibrillation: Secondary | ICD-10-CM

## 2019-08-23 DIAGNOSIS — I10 Essential (primary) hypertension: Secondary | ICD-10-CM

## 2019-08-23 DIAGNOSIS — I2510 Atherosclerotic heart disease of native coronary artery without angina pectoris: Secondary | ICD-10-CM | POA: Diagnosis not present

## 2019-08-23 DIAGNOSIS — I359 Nonrheumatic aortic valve disorder, unspecified: Secondary | ICD-10-CM | POA: Diagnosis not present

## 2019-08-23 DIAGNOSIS — Z952 Presence of prosthetic heart valve: Secondary | ICD-10-CM

## 2019-08-23 MED ORDER — LISINOPRIL 5 MG PO TABS: 5.0000 mg | ORAL_TABLET | Freq: Every day | ORAL | 3 refills | Status: DC

## 2019-08-23 NOTE — Patient Instructions (Signed)
Medication Instructions:  DECREASE LISINOPRIL TO 5 MG ONCE DAILY  If you need a refill on your cardiac medications before your next appointment, please call your pharmacy.   Lab work: Your physician recommends that you HAVE LAB WORK TODAY  If you have labs (blood work) drawn today and your tests are completely normal, you will receive your results only by: Marland Kitchen MyChart Message (if you have MyChart) OR . A paper copy in the mail If you have any lab test that is abnormal or we need to change your treatment, we will call you to review the results.  Testing/Procedures: A chest x-ray takes a picture of the organs and structures inside the chest, including the heart, lungs, and blood vessels. This test can show several things, including, whether the heart is enlarges; whether fluid is building up in the lungs; and whether pacemaker / defibrillator leads are still in place. [place] OFFICE  Follow-Up: At [facility], you and your health needs are our priority.  As part of our continuing mission to provide you with exceptional heart care, we have created designated Provider Care Teams.  These Care Teams include your primary Cardiologist (physician) and Advanced Practice Providers (APPs -  Physician Assistants and Nurse Practitioners) who all work together to provide you with the care you need, when you need it. Your physician wants you to follow-up in: [place] will receive a reminder letter in the mail two months in advance. If you don't receive a letter, please call our office to schedule the follow-up appointment.   APPOINTMENT WITH DR CAMNITZ IN [place] FOR PACEMAKER CHECK

## 2019-08-25 ENCOUNTER — Other Ambulatory Visit: Payer: Self-pay | Admitting: Family Medicine

## 2019-08-25 DIAGNOSIS — F411 Generalized anxiety disorder: Secondary | ICD-10-CM

## 2019-08-25 NOTE — Telephone Encounter (Signed)
Pt also called for Rx refill. She states she had all her front teeth pulled yesterday and is not sleeping well. rx has already been pended and routing to PCP.

## 2019-08-25 NOTE — Telephone Encounter (Signed)
[facility] pharmacy requesting med refill for alprazolam.

## 2019-08-28 ENCOUNTER — Ambulatory Visit (INDEPENDENT_AMBULATORY_CARE_PROVIDER_SITE_OTHER): Payer: Medicare HMO

## 2019-08-28 ENCOUNTER — Other Ambulatory Visit: Payer: Self-pay

## 2019-08-28 DIAGNOSIS — I480 Paroxysmal atrial fibrillation: Secondary | ICD-10-CM

## 2019-08-28 DIAGNOSIS — R1031 Right lower quadrant pain: Secondary | ICD-10-CM | POA: Diagnosis not present

## 2019-08-28 DIAGNOSIS — J929 Pleural plaque without asbestos: Secondary | ICD-10-CM | POA: Diagnosis not present

## 2019-08-28 MED ORDER — IOHEXOL 300 MG/ML  SOLN
100.0000 mL | Freq: Once | INTRAMUSCULAR | Status: AC | PRN
  Administered 2019-08-28: 100 mL via INTRAVENOUS

## 2019-08-29 ENCOUNTER — Ambulatory Visit: Payer: Medicare HMO | Admitting: Gastroenterology

## 2019-08-29 ENCOUNTER — Encounter: Payer: Self-pay | Admitting: Family Medicine

## 2019-08-29 ENCOUNTER — Encounter

## 2019-08-29 ENCOUNTER — Ambulatory Visit (INDEPENDENT_AMBULATORY_CARE_PROVIDER_SITE_OTHER): Payer: Medicare HMO | Admitting: Family Medicine

## 2019-08-29 ENCOUNTER — Encounter: Payer: Medicare HMO | Admitting: Internal Medicine

## 2019-08-29 VITALS — BP 168/82 | HR 78

## 2019-08-29 DIAGNOSIS — R1031 Right lower quadrant pain: Secondary | ICD-10-CM

## 2019-08-29 DIAGNOSIS — W19XXXA Unspecified fall, initial encounter: Secondary | ICD-10-CM | POA: Diagnosis not present

## 2019-08-29 DIAGNOSIS — E278 Other specified disorders of adrenal gland: Secondary | ICD-10-CM | POA: Insufficient documentation

## 2019-08-29 DIAGNOSIS — R0781 Pleurodynia: Secondary | ICD-10-CM

## 2019-08-29 MED ORDER — HYDROCODONE-ACETAMINOPHEN 5-325 MG PO TABS: 1.0000 | ORAL_TABLET | Freq: Three times a day (TID) | ORAL | 0 refills | Status: DC | PRN

## 2019-08-29 NOTE — Patient Instructions (Signed)
Thank you for coming in today. Use hydrocodone sparingly.  I will talk with Dr Suzi Roots about your belly pain and next step.   Keep up dated.  Recheck as needed and as scheduled.

## 2019-08-29 NOTE — Progress Notes (Signed)
Laurie [name] is a 84 y.o. female who presents to [place]: [place] today for fall.  Patient tripped and fell forward on Saturday, September 26.  She thinks she caught her weight with her arms.  She does not remember hitting her abdomen or her chest.  She notes soreness in her ribs bilaterally.  She notes continued abdominal pain not much change from last visit.  She denies hitting her head.  She notes that she has been using some of her hydrocodone for pain control which has been helpful.  She was advised to return to clinic today for recheck of your elevation after her fall.  However on yesterday September 28 she had incidentally prior to injury arranged chest x-ray from cardiology and CT scan abdomen and pelvis from me from last visit on September 11.  These x-rays were done after injury yesterday and did not show any fractures.   ROS as above:  Exam:  BP (!) 168/82   Pulse 78  Wt Readings from Last 5 Encounters:  08/23/19 164 lb 1.9 oz (74.4 kg)  08/11/19 162 lb 8 oz (73.7 kg)  07/03/19 164 lb (74.4 kg)  06/22/19 167 lb (75.8 kg)  06/05/19 169 lb (76.7 kg)    Gen: Well NAD HEENT: EOMI,  MMM Lungs: Normal work of breathing.  Coarse breath sounds present bilaterally however breath sounds extend to lung fields both sides. Heart: Regular rate.  Loud systolic murmur present. Chest wall: Nontender to palpation bilateral chest wall and thoracic ribs both sides. Abd: NABS, Soft. Nondistended, mildly tender to palpation right lower quadrant abdomen with no rebound or guarding.  No masses palpated. Exts: Brisk capillary refill, warm and well perfused.    Lab and Radiology Results No results found for this or any previous visit (from the past 72 hour(s)). Dg Chest 2 View  Result Date: 08/28/2019 CLINICAL DATA:  Amiodarone surveillance. Lung cancer post lumpectomy 2009.  EXAM: CHEST - 2 VIEW COMPARISON:  01/19/2019 and 12/01/2018. FINDINGS: Sternotomy wires and left-sided pacemaker unchanged. Lungs are adequately inflated without consolidation or effusion. Biapical pleural thickening. Prominence with smooth well-defined convex lateral border over the right hilar region likely representing prominent ascending thoracic aorta. This appears more prominent than on the prior exam. Cardiac silhouette and remainder the exam is unchanged. IMPRESSION: No acute cardiopulmonary disease. Increased interval density over the right hilar region with smooth convex lateral border likely prominence of the ascending thoracic aorta. Recommend CT chest with contrast for further evaluation. Electronically Signed   By: Marin Olp M.D.   On: 08/28/2019 13:58   Ct Abdomen Pelvis W Contrast  Result Date: 08/28/2019 CLINICAL DATA:  Abdominal pain which is acute and generalized. History of right lower quadrant pain for years. Constipation. EXAM: CT ABDOMEN AND PELVIS WITH CONTRAST TECHNIQUE: Multidetector CT imaging of the abdomen and pelvis was performed using the standard protocol following bolus administration of intravenous contrast. CONTRAST:  152mL OMNIPAQUE IOHEXOL 300 MG/ML  SOLN COMPARISON:  01/17/2019 FINDINGS: Lower chest: No acute findings in the lung bases. Cardiac pacer leads are present. Hepatobiliary: Previous cholecystectomy. Stable pneumobilia with minimal prominence of the common bile duct and central intrahepatic ducts post cholecystectomy. Liver is otherwise unremarkable. Pancreas: Normal. Spleen: Normal. Adrenals/Urinary Tract: Stable 2 cm left adrenal nodule likely adenoma. Right adrenal gland is normal. Kidneys are normal in size without hydronephrosis or nephrolithiasis. Ureters and bladder are normal. Stomach/Bowel: Stomach and small bowel are normal. Mild  diverticulosis of the colon. Appendix previously removed. Vascular/Lymphatic: Calcified plaque over the abdominal aorta.  There is dilatation of the infrarenal abdominal aorta measuring 3.3 x 4 cm in AP and transverse dimension (previously 3.3 x 3.9 cm). No adenopathy. Reproductive: Normal. Other: No free fluid or focal inflammatory change. Musculoskeletal: Mild degenerative change of the spine. IMPRESSION: 1.  No acute findings in the abdomen/pelvis. 2. Stable 3.3 x 4 cm infrarenal abdominal aortic aneurysm. Aortic Atherosclerosis (ICD10-I70.0). Recommend followup by ultrasound in 1 year. This recommendation follows ACR consensus guidelines: White Paper of the ACR Incidental Findings Committee II on Vascular Findings. J Am Coll Radiol 2013; 10:789-794. Aortic aneurysm NOS (ICD10-I71.9). 3.  Diverticulosis of the colon. 4.  Stable 2 cm left adrenal nodule likely adenoma. 5.  Postsurgical changes as described. Electronically Signed   By: Marin Olp M.D.   On: 08/28/2019 13:53   I personally (independently) visualized and performed the interpretation of the images attached in this note.    Assessment and Plan: 84 y.o. female with fall with rib pain.  Fortunately already obtain chest x-ray and CT scan abdomen and pelvis did not show any fractures or significant injury.  Overall she seems to be doing reasonably well.  Plan for watchful waiting.  Limited hydrocodone prescribed.  Abdominal pain: Ongoing now.  Patient has had initial work-up that does not show obvious cause of abdominal pain.  I think at this point it may be reasonable to have evaluation with gastroenterology.  However I will discuss the matter with her PCP.  PDMP reviewed during this encounter. No orders of the defined types were placed in this encounter.  Meds ordered this encounter  Medications  . HYDROcodone-acetaminophen (NORCO/VICODIN) 5-325 MG tablet    Sig: Take 1 tablet by mouth 3 (three) times daily as needed for moderate pain.    Dispense:  15 tablet    Refill:  0    Ok to fill now. Had fall with acute worsening pain.     Historical  information moved to improve visibility of documentation.  Past Medical History:  Diagnosis Date  . AAA (abdominal aortic aneurysm) ([place])   . Anemia    iron deficiency  . Ascending aorta dilatation ([facility])   . Atrial fibrillation ([place])    post op  . CAD (coronary artery disease)   . Carcinoma of lung ([place]) 08/28/2008   T2N0 moderately diff. squamous cell CA  . Cerebrovascular disease   . COPD (chronic obstructive pulmonary disease) ([place]) 2005   on CXR with R apical scaring  . Headache(784.0)    Hx: of migraines in the past  . Hypercholesterolemia    Hx: of  . Hypertension   . Incidental pulmonary nodule, > 35mm and < 2mm    LLL  . S/P aortic valve replacement 08/28/2008   [facility]en  . S/P lobectomy of lung 08/28/2008   rt middle lobe  dr. Roxy Manns  . Shingles 11-09   Past Surgical History:  Procedure Laterality Date  . ABDOMINAL SURGERY    . AORTIC VALVE REPLACEMENT  08/28/2008   #17mm [facility] Ease pericardial tissue valve  . APPENDECTOMY    . CARDIAC CATHETERIZATION    . CATARACT EXTRACTION W/ INTRAOCULAR LENS  IMPLANT, BILATERAL Bilateral   . CHOLECYSTECTOMY N/A 12/13/2013   Procedure: LAPAROSCOPIC CHOLECYSTECTOMY;  Surgeon: Ralene Ok, MD;  Location: [place];  Service: General;  Laterality: N/A;  . COLONOSCOPY     \  . DILATION AND CURETTAGE OF UTERUS    .  ERCP N/A 05/08/2015   Procedure: ENDOSCOPIC RETROGRADE CHOLANGIOPANCREATOGRAPHY (ERCP);  Surgeon: Inda Castle, MD;  Location: [place];  Service: Endoscopy;  Laterality: N/A;  . ERCP N/A 01/13/2016   Procedure: ENDOSCOPIC RETROGRADE CHOLANGIOPANCREATOGRAPHY (ERCP);  Surgeon: Doran Stabler, MD;  Location: [facility];  Service: Endoscopy;  Laterality: N/A;  . ERCP N/A 03/09/2016   Procedure: ENDOSCOPIC RETROGRADE CHOLANGIOPANCREATOGRAPHY (ERCP);  Surgeon: Ladene Artist, MD;  Location: [facility];  Service: Endoscopy;  Laterality: N/A;  . PACEMAKER IMPLANT N/A 12/05/2018   Procedure: PACEMAKER IMPLANT;   Surgeon: Evans Lance, MD;  Location: [place] [facility];  Service: Cardiovascular;  Laterality: N/A;  . RML removed Dr Ricard Dillon for lung cancer  08/28/2008   T2N0 squamous cell CA  . TONSILLECTOMY AND ADENOIDECTOMY    . u/s guided aspiration of R breast cyst   2009   at the breast clinic   Social History   Tobacco Use  . Smoking status: Former Smoker    Years: 40.00  . Smokeless tobacco: Never Used  Substance Use Topics  . Alcohol use: No    Alcohol/week: 0.0 standard drinks   family history includes Diabetes in her son; Stroke in her father.  Medications: Current Outpatient Medications  Medication Sig Dispense Refill  . acetaminophen (TYLENOL) 500 MG tablet Take 2 tablets (1,000 mg total) by mouth every 8 (eight) hours as needed (pain). 30 tablet 0  . albuterol (PROVENTIL) (2.5 MG/3ML) 0.083% nebulizer solution Take 3 mLs (2.5 mg total) by nebulization every 4 (four) hours as needed for wheezing or shortness of breath. 30 vial 0  . ALPRAZolam (XANAX) 0.5 MG tablet TAKE 1 TABLET (0.5 MG TOTAL) BY MOUTH 2 (TWO) TIMES DAILY AS NEEDED FOR ANXIETY. TAKE 0.5-1 TABLETS (0.25-5 MG TOTAL) BY MOUTH 2 (TWO) TIMES DAILY AS NEEDED. 60 tablet 0  . amiodarone (PACERONE) 200 MG tablet Take 1 tablet (200 mg total) by mouth daily. 90 tablet 3  . furosemide (LASIX) 20 MG tablet Take 1 tablet (20 mg total) by mouth as needed for edema (for leg swelling). 90 tablet 0  . HYDROcodone-acetaminophen (NORCO/VICODIN) 5-325 MG tablet Take 1 tablet by mouth 3 (three) times daily as needed for moderate pain. 15 tablet 0  . linaclotide (LINZESS) 290 MCG CAPS capsule Take 1 capsule (290 mcg total) by mouth daily before breakfast. 15 capsule 0  . lisinopril (ZESTRIL) 5 MG tablet Take 1 tablet (5 mg total) by mouth daily. 90 tablet 3  . polyethylene glycol powder (GLYCOLAX/MIRALAX) powder Take 17 g by mouth daily. (Patient taking differently: Take 17 g by mouth daily as needed (constipation). ) 250 g 2  . XARELTO 20  MG TABS tablet TAKE 1 TABLET (20 MG TOTAL) BY MOUTH DAILY WITH SUPPER. 90 tablet 0   No current facility-administered medications for this visit.    Allergies  Allergen Reactions  . Hyoscyamine Other (See Comments)    Mental status changes.  (as of 02/13/16 patient denies any problems with this medication)  . Rofecoxib Anaphylaxis and Hives  . Statins Other (See Comments)    Muscle aches  . Metoprolol Other (See Comments)    Extreme fatigue, weakness and brady     Discussed warning signs or symptoms. Please see discharge instructions. Patient expresses understanding.

## 2019-08-30 ENCOUNTER — Telehealth: Payer: Self-pay | Admitting: *Deleted

## 2019-08-30 DIAGNOSIS — R9389 Abnormal findings on diagnostic imaging of other specified body structures: Secondary | ICD-10-CM

## 2019-08-30 NOTE — Telephone Encounter (Signed)
Spoke with pt, aware of cxr results. Order placed for CT scan to be done in [place]. They will call the patient to schedule.

## 2019-08-30 NOTE — Telephone Encounter (Addendum)
Left message for pt to call   ----- Message from Lelon Perla, MD sent at 08/28/2019  2:08 PM EDT ----- Chest CT with contrast [name]

## 2019-09-04 ENCOUNTER — Other Ambulatory Visit: Payer: Medicare HMO

## 2019-09-04 ENCOUNTER — Telehealth: Payer: Self-pay | Admitting: Cardiology

## 2019-09-04 MED ORDER — FUROSEMIDE 20 MG PO TABS: 20.0000 mg | ORAL_TABLET | ORAL | 0 refills | Status: DC | PRN

## 2019-09-04 NOTE — Telephone Encounter (Signed)
Pt c/o bilateral LE pain and swelling. Pt states that her LLE is swollen and warm to the touch from the hip down and her RLE is swollen and warm to the touch. She states that she takes her Xarelto and all medications as ordered. She states that she does not take lasix daily only PRN swelling. She states that she cannot find the bottle to take. I have sent a refill for her to take to see if this will help with the swelling. She will continue to take her weight, but she states that he has been constant at 162 with no increase to explain this swelling. Made appt 10-7 for evaluation.

## 2019-09-04 NOTE — Telephone Encounter (Signed)
Patient reports that she can not walk on her leg. It is painful and feels hot to the touch. She states that the pain started after her visit with Dr. Stanford Breed last week. She does not have any swelling in her legs.   She does not want to go to the ER if she doesn't have to.

## 2019-09-05 NOTE — Progress Notes (Signed)
Cardiology Office Note   Date:  09/06/2019   ID:  Laurie [name], DOB Oct 07, 1935, MRN [id number]  PCP:  Hali Marry, MD  Cardiologist:  [facility]  No chief complaint on file.    History of Present Illness: Laurie [name] is a 84 y.o. female who presents for ongoing assessment and management of atrial fibrillation (on amiodarone and Xarelto), coronary artery disease, history of aortic stenosis status post aortic valve replacement 07/2008 with a pericardial tissue valve and a septal myomectomy and a right middle lobe lobectomy due to lung CA.  Cardiac catheterization revealed 50% LAD.  Carotid Dopplers in November 2017 revealed that the patient had mild bilateral atherosclerosis but no hemodynamically stenosis.  She has a history of PAD with bilateral ABIs in May 2018 revealing significant distal aorta and bilateral common iliac artery stenosis with moderate left FSA stenosis she was seen by Dr. Fletcher Anon who recommended medical therapy only.  Most recent admission was in January 2020 with recurrent atrial fibrillation with post termination pauses.  The patient had a permanent pacemaker placed at that time (2272 [employer] MRI), with most recent pacemaker interrogation on 06/17/2019.  A  CT scan in January 2020 revealed a 4.6 cm ascending thoracic aortic aneurysm, with repeat CT scan in February 2020 revealing 3.9 cm AAA.  She was last seen in the office on 08/23/2019 by Dr. Stanford Breed at which time she had had some dizziness when she was standing, but denied any orthopnea, chest pain, or dyspnea on exertion.  Lisinopril was decreased to 5 mg daily to ascertain if her symptoms improved concerning dizziness.  She is here for follow-up concerning her response to medication changes.  Also, the patient called our office on 09/04/2019 with complaints of lower extremity pain and edema.  She also stated that her left lower extremity was swollen and warm to touch from the hip down in her right  lower extremity was swollen and warm to touch.  She only was taking Lasix as needed for swelling and not on a daily basis.  She was unable to find her Lasix tablets when she called.  A new prescription for Lasix was sent to her pharmacy.  She presents the clinic today and states she has been experiencing increased back pain with ambulation over the past several months.  She also states she has had a fall out of the yard in the fall in her bedroom which resulted in rib fracture.  She states that each time she gets up to walk she experiences leg pain.  The leg pain subsides with rest however reoccurs each time she gets up to walk.  She has become very sedentary due to her lower extremity pain.  She denies chest pain, increased shortness of breath, lower extremity edema, fatigue, palpitations, melena, hematuria, hemoptysis, diaphoresis,  presyncope, syncope, orthopnea, and PND.   Past Medical History:  Diagnosis Date  . AAA (abdominal aortic aneurysm) ([place])   . Anemia    iron deficiency  . Ascending aorta dilatation ([facility])   . Atrial fibrillation ([place])    post op  . CAD (coronary artery disease)   . Carcinoma of lung ([place]) 08/28/2008   T2N0 moderately diff. squamous cell CA  . Cerebrovascular disease   . COPD (chronic obstructive pulmonary disease) ([place]) 2005   on CXR with R apical scaring  . Headache(784.0)    Hx: of migraines in the past  . Hypercholesterolemia    Hx: of  . Hypertension   . Incidental pulmonary  nodule, > 16mm and < 11mm    LLL  . S/P aortic valve replacement 08/28/2008   [facility]  . S/P lobectomy of lung 08/28/2008   rt middle lobe  dr. Roxy Manns  . Shingles 11-09    Past Surgical History:  Procedure Laterality Date  . ABDOMINAL SURGERY    . AORTIC VALVE REPLACEMENT  08/28/2008   #72mm [facility] Ease pericardial tissue valve  . APPENDECTOMY    . CARDIAC CATHETERIZATION    . CATARACT EXTRACTION W/ INTRAOCULAR LENS  IMPLANT, BILATERAL Bilateral   . CHOLECYSTECTOMY N/A  12/13/2013   Procedure: LAPAROSCOPIC CHOLECYSTECTOMY;  Surgeon: Ralene Ok, MD;  Location: [place];  Service: General;  Laterality: N/A;  . COLONOSCOPY     \  . DILATION AND CURETTAGE OF UTERUS    . ERCP N/A 05/08/2015   Procedure: ENDOSCOPIC RETROGRADE CHOLANGIOPANCREATOGRAPHY (ERCP);  Surgeon: Inda Castle, MD;  Location: [place];  Service: Endoscopy;  Laterality: N/A;  . ERCP N/A 01/13/2016   Procedure: ENDOSCOPIC RETROGRADE CHOLANGIOPANCREATOGRAPHY (ERCP);  Surgeon: Doran Stabler, MD;  Location: [facility];  Service: Endoscopy;  Laterality: N/A;  . ERCP N/A 03/09/2016   Procedure: ENDOSCOPIC RETROGRADE CHOLANGIOPANCREATOGRAPHY (ERCP);  Surgeon: Ladene Artist, MD;  Location: [facility];  Service: Endoscopy;  Laterality: N/A;  . PACEMAKER IMPLANT N/A 12/05/2018   Procedure: PACEMAKER IMPLANT;  Surgeon: Evans Lance, MD;  Location: [place] [facility];  Service: Cardiovascular;  Laterality: N/A;  . RML removed Dr Ricard Dillon for lung cancer  08/28/2008   T2N0 squamous cell CA  . TONSILLECTOMY AND ADENOIDECTOMY    . u/s guided aspiration of R breast cyst   2009   at the breast clinic     Current Outpatient Medications  Medication Sig Dispense Refill  . acetaminophen (TYLENOL) 500 MG tablet Take 2 tablets (1,000 mg total) by mouth every 8 (eight) hours as needed (pain). 30 tablet 0  . albuterol (PROVENTIL) (2.5 MG/3ML) 0.083% nebulizer solution Take 3 mLs (2.5 mg total) by nebulization every 4 (four) hours as needed for wheezing or shortness of breath. 30 vial 0  . ALPRAZolam (XANAX) 0.5 MG tablet TAKE 1 TABLET (0.5 MG TOTAL) BY MOUTH 2 (TWO) TIMES DAILY AS NEEDED FOR ANXIETY. TAKE 0.5-1 TABLETS (0.25-5 MG TOTAL) BY MOUTH 2 (TWO) TIMES DAILY AS NEEDED. 60 tablet 0  . amiodarone (PACERONE) 200 MG tablet Take 1 tablet (200 mg total) by mouth daily. 90 tablet 3  . furosemide (LASIX) 20 MG tablet Take 1 tablet (20 mg total) by mouth as needed for edema (for leg swelling). 30 tablet 0   . HYDROcodone-acetaminophen (NORCO/VICODIN) 5-325 MG tablet Take 1 tablet by mouth 3 (three) times daily as needed for moderate pain. 15 tablet 0  . linaclotide (LINZESS) 290 MCG CAPS capsule Take 1 capsule (290 mcg total) by mouth daily before breakfast. 15 capsule 0  . lisinopril (ZESTRIL) 5 MG tablet Take 1 tablet (5 mg total) by mouth daily. 90 tablet 3  . polyethylene glycol powder (GLYCOLAX/MIRALAX) powder Take 17 g by mouth daily. (Patient taking differently: Take 17 g by mouth daily as needed (constipation). ) 250 g 2  . XARELTO 20 MG TABS tablet TAKE 1 TABLET (20 MG TOTAL) BY MOUTH DAILY WITH SUPPER. 90 tablet 0   No current facility-administered medications for this visit.     Allergies:   Hyoscyamine, Rofecoxib, Statins, and Metoprolol    Social History:  The patient  reports that she has quit smoking. She quit  after 40.00 years of use. She has never used smokeless tobacco. She reports that she does not drink alcohol or use drugs.   Family History:  The patient's family history includes Diabetes in her son; Stroke in her father.    ROS: All other systems are reviewed and negative. Unless otherwise mentioned in H&P    PHYSICAL EXAM: VS:  Wt 163 lb (73.9 kg)   BMI 26.31 kg/m  , BMI Body mass index is 26.31 kg/m. GEN: Well nourished, well developed, in no acute distress HEENT: normal Neck: no JVD, carotid bruits, or masses Cardiac: RRR; no murmurs, rubs, or gallops,no edema, DP and PT pulses unable to palpate   Respiratory:  Clear to auscultation bilaterally, normal work of breathing GI: soft, nontender, nondistended, + BS MS: no deformity or atrophy Skin: warm and dry, no rash Neuro:  Strength and sensation are intact Psych: euthymic mood, full affect   EKG:  EKG is not ordered today.   EKG 08/23/2019 Sinus rhythm with sinus arrhythmia 72 bpm   Recent Labs: 04/04/2019: TSH 2.86 04/12/2019: Magnesium 2.2 08/11/2019: ALT 20; BUN 9; Creat 0.87; Hemoglobin 15.3;  Platelets 244; Potassium 4.4; Sodium 129    Lipid Panel    Component Value Date/Time   CHOL 291 (H) 09/18/2010 2032   TRIG 308 (H) 09/18/2010 2032   HDL 38 (L) 09/18/2010 2032   CHOLHDL 7.7 Ratio 09/18/2010 2032   VLDL 62 (H) 09/18/2010 2032   LDLCALC 191 (H) 09/18/2010 2032      Wt Readings from Last 3 Encounters:  09/06/19 163 lb (73.9 kg)  08/23/19 164 lb 1.9 oz (74.4 kg)  08/11/19 162 lb 8 oz (73.7 kg)      Other studies Reviewed: Echocardiogram 05/05/2019 1. The left ventricle appears hypertrophied with hyperdynamic systolic function, with an ejection fraction of >65%. The cavity size was normal.  2. The right ventricle has normal systolic function. The cavity was normal. There is no increase in right ventricular wall thickness.  3. The bioprosthetic AVR is poorly imaged, doppler parameters are normal.  03/30/2017    ABI of 0.57 on the right and 0.76 on the left. Waveforms were suggestive of inflow disease. Duplex showed significant distal aortic and bilateral common iliac artery stenosis with moderate left SFA stenosis.Seen by Dr Fletcher Anon and medical therapy recommended for now.  ASSESSMENT AND PLAN:  Lower extremity pain/falls- bilateral lower extremity pain with each episode of ambulation.  Pain subsides as she stops ambulation and rests.  Reports at least 2 falls at her residence recently. Recommend follow-up with Dr. Sophronia Simas Ordered rolling walker with seat  Essential hypertension-BP today 101/64.  Well-controlled at home patient stopped lisinopril due to hypotension. Continue heart healthy low-sodium diet.  Hyperlipidemia-statin intolerance Continue to manage with low-sodium heart healthy high-fiber diet   Coronary artery disease-no chest pain today Statin intolerance and not taking aspirin due to anticoagulation. Heart healthy low-sodium diet Increase physical activity as tolerated  Peripheral arterial disease- ABI of 0.57 on the right and 0.76 on the left.  Waveforms were suggestive of inflow disease. Duplex showed significant distal aortic and bilateral common iliac artery stenosis with moderate left SFA stenosis.Seen by Dr Fletcher Anon and medical therapy recommended.05/04/2017 Continue Xarelto 20 mg tablet daily Heart healthy low-sodium diet Increase physical activity as tolerated Recommend follow-up with Dr. Sophronia Simas  Disposition: Follow-up first available with Dr.  Fletcher Anon    Current medicines are reviewed at length with the patient today.    Labs/ tests ordered today include: None  todayColetta Memos NP-C    09/06/2019 1:58 PM    [address] Office [phone] Fax [phone]  Notice: This dictation was prepared with Dragon dictation along with smaller phrase technology. Any transcriptional errors that result from this process are unintentional and may not be corrected upon review.

## 2019-09-06 ENCOUNTER — Ambulatory Visit (INDEPENDENT_AMBULATORY_CARE_PROVIDER_SITE_OTHER): Payer: Medicare HMO | Admitting: General Practice

## 2019-09-06 ENCOUNTER — Other Ambulatory Visit: Payer: Self-pay

## 2019-09-06 ENCOUNTER — Encounter: Payer: Self-pay | Admitting: Adult Health

## 2019-09-06 ENCOUNTER — Telehealth: Payer: Self-pay

## 2019-09-06 VITALS — BP 101/64 | HR 73 | Temp 96.8°F | Ht 66.0 in | Wt 163.0 lb

## 2019-09-06 DIAGNOSIS — Z9181 History of falling: Secondary | ICD-10-CM | POA: Diagnosis not present

## 2019-09-06 DIAGNOSIS — M79605 Pain in left leg: Secondary | ICD-10-CM

## 2019-09-06 DIAGNOSIS — M79604 Pain in right leg: Secondary | ICD-10-CM | POA: Diagnosis not present

## 2019-09-06 DIAGNOSIS — I739 Peripheral vascular disease, unspecified: Secondary | ICD-10-CM | POA: Diagnosis not present

## 2019-09-06 DIAGNOSIS — E7800 Pure hypercholesterolemia, unspecified: Secondary | ICD-10-CM

## 2019-09-06 DIAGNOSIS — I10 Essential (primary) hypertension: Secondary | ICD-10-CM | POA: Diagnosis not present

## 2019-09-06 DIAGNOSIS — W19XXXS Unspecified fall, sequela: Secondary | ICD-10-CM

## 2019-09-06 DIAGNOSIS — J449 Chronic obstructive pulmonary disease, unspecified: Secondary | ICD-10-CM

## 2019-09-06 DIAGNOSIS — I2510 Atherosclerotic heart disease of native coronary artery without angina pectoris: Secondary | ICD-10-CM | POA: Diagnosis not present

## 2019-09-06 MED ORDER — AMBULATORY NON FORMULARY MEDICATION: 0 refills | Status: DC

## 2019-09-06 NOTE — Patient Instructions (Addendum)
Medication Instructions:  Continue current medications  If you need a refill on your cardiac medications before your next appointment, please call your pharmacy.  Labwork: None Ordered   Testing/Procedures: None Ordered  Follow-Up: . Your physician recommends that you schedule a follow-up appointment in: November 24th @ 11:20 with Dr Fletcher Anon   At [facility], you and your health needs are our priority.  As part of our continuing mission to provide you with exceptional heart care, we have created designated Provider Care Teams.  These Care Teams include your primary Cardiologist (physician) and Advanced Practice Providers (APPs -  Physician Assistants and Nurse Practitioners) who all work together to provide you with the care you need, when you need it.  Thank you for choosing [facility] at [facility]!!

## 2019-09-06 NOTE — Telephone Encounter (Signed)
Needs a nebulizer.

## 2019-09-07 ENCOUNTER — Telehealth: Payer: Self-pay

## 2019-09-07 NOTE — Telephone Encounter (Signed)
Laurie [name] states she needs a refill on pain medication. She states she still has right side pain. She states she will not see a specialist in November.

## 2019-09-08 ENCOUNTER — Telehealth: Payer: Self-pay

## 2019-09-08 DIAGNOSIS — I480 Paroxysmal atrial fibrillation: Secondary | ICD-10-CM | POA: Diagnosis not present

## 2019-09-08 MED ORDER — HYDROCODONE-ACETAMINOPHEN 5-325 MG PO TABS: 1.0000 | ORAL_TABLET | Freq: Three times a day (TID) | ORAL | 0 refills | Status: DC | PRN

## 2019-09-08 NOTE — Telephone Encounter (Addendum)
Deirdre received a bill from [employer] for B1 B6 Co[employer] Z[employer] magnesium folate and ferritin for $416. [facility] will need different codes to cover these tests.    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

## 2019-09-08 NOTE — Telephone Encounter (Signed)
Pt advised.

## 2019-09-08 NOTE — Telephone Encounter (Signed)
Hydrocodone refilled

## 2019-09-10 NOTE — Telephone Encounter (Signed)
See if we can add  R79.0 Low ferritin R74.8 abnormal serum enzymes.  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

## 2019-09-11 ENCOUNTER — Inpatient Hospital Stay (HOSPITAL_COMMUNITY)
Admission: EM | Admit: 2019-09-11 | Discharge: 2019-09-15 | DRG: 301 | Disposition: A | Discharge status: Home / Self Care | Payer: Medicare HMO | Attending: Thoracic Surgery (Cardiothoracic Vascular Surgery) | Admitting: Thoracic Surgery (Cardiothoracic Vascular Surgery)

## 2019-09-11 ENCOUNTER — Telehealth: Payer: Self-pay | Admitting: Cardiology

## 2019-09-11 ENCOUNTER — Other Ambulatory Visit: Payer: Self-pay

## 2019-09-11 ENCOUNTER — Ambulatory Visit (INDEPENDENT_AMBULATORY_CARE_PROVIDER_SITE_OTHER): Payer: Medicare HMO

## 2019-09-11 DIAGNOSIS — Z9841 Cataract extraction status, right eye: Secondary | ICD-10-CM

## 2019-09-11 DIAGNOSIS — M25551 Pain in right hip: Secondary | ICD-10-CM | POA: Diagnosis present

## 2019-09-11 DIAGNOSIS — I7101 Dissection of ascending aorta: Secondary | ICD-10-CM | POA: Diagnosis present

## 2019-09-11 DIAGNOSIS — Z85118 Personal history of other malignant neoplasm of bronchus and lung: Secondary | ICD-10-CM | POA: Diagnosis not present

## 2019-09-11 DIAGNOSIS — I10 Essential (primary) hypertension: Secondary | ICD-10-CM | POA: Diagnosis not present

## 2019-09-11 DIAGNOSIS — Z950 Presence of cardiac pacemaker: Secondary | ICD-10-CM

## 2019-09-11 DIAGNOSIS — Z961 Presence of intraocular lens: Secondary | ICD-10-CM | POA: Diagnosis present

## 2019-09-11 DIAGNOSIS — Z8673 Personal history of transient ischemic attack (TIA), and cerebral infarction without residual deficits: Secondary | ICD-10-CM

## 2019-09-11 DIAGNOSIS — E7800 Pure hypercholesterolemia, unspecified: Secondary | ICD-10-CM | POA: Diagnosis present

## 2019-09-11 DIAGNOSIS — Z953 Presence of xenogenic heart valve: Secondary | ICD-10-CM

## 2019-09-11 DIAGNOSIS — I480 Paroxysmal atrial fibrillation: Secondary | ICD-10-CM | POA: Diagnosis not present

## 2019-09-11 DIAGNOSIS — F411 Generalized anxiety disorder: Secondary | ICD-10-CM | POA: Diagnosis present

## 2019-09-11 DIAGNOSIS — M8580 Other specified disorders of bone density and structure, unspecified site: Secondary | ICD-10-CM | POA: Diagnosis present

## 2019-09-11 DIAGNOSIS — R9389 Abnormal findings on diagnostic imaging of other specified body structures: Secondary | ICD-10-CM | POA: Diagnosis not present

## 2019-09-11 DIAGNOSIS — C3490 Malignant neoplasm of unspecified part of unspecified bronchus or lung: Secondary | ICD-10-CM | POA: Diagnosis present

## 2019-09-11 DIAGNOSIS — R1031 Right lower quadrant pain: Secondary | ICD-10-CM | POA: Diagnosis present

## 2019-09-11 DIAGNOSIS — J449 Chronic obstructive pulmonary disease, unspecified: Secondary | ICD-10-CM | POA: Diagnosis not present

## 2019-09-11 DIAGNOSIS — Z9842 Cataract extraction status, left eye: Secondary | ICD-10-CM

## 2019-09-11 DIAGNOSIS — I7100 Dissection of unspecified site of aorta: Secondary | ICD-10-CM | POA: Diagnosis not present

## 2019-09-11 DIAGNOSIS — I714 Abdominal aortic aneurysm, without rupture: Secondary | ICD-10-CM | POA: Diagnosis present

## 2019-09-11 DIAGNOSIS — Z23 Encounter for immunization: Secondary | ICD-10-CM | POA: Diagnosis not present

## 2019-09-11 DIAGNOSIS — M549 Dorsalgia, unspecified: Secondary | ICD-10-CM | POA: Diagnosis present

## 2019-09-11 DIAGNOSIS — Z7901 Long term (current) use of anticoagulants: Secondary | ICD-10-CM

## 2019-09-11 DIAGNOSIS — Z66 Do not resuscitate: Secondary | ICD-10-CM | POA: Diagnosis not present

## 2019-09-11 DIAGNOSIS — Z515 Encounter for palliative care: Secondary | ICD-10-CM | POA: Diagnosis not present

## 2019-09-11 DIAGNOSIS — I712 Thoracic aortic aneurysm, without rupture: Secondary | ICD-10-CM | POA: Diagnosis not present

## 2019-09-11 DIAGNOSIS — I2510 Atherosclerotic heart disease of native coronary artery without angina pectoris: Secondary | ICD-10-CM | POA: Diagnosis not present

## 2019-09-11 DIAGNOSIS — I959 Hypotension, unspecified: Secondary | ICD-10-CM | POA: Diagnosis not present

## 2019-09-11 DIAGNOSIS — E278 Other specified disorders of adrenal gland: Secondary | ICD-10-CM | POA: Diagnosis present

## 2019-09-11 DIAGNOSIS — K5900 Constipation, unspecified: Secondary | ICD-10-CM | POA: Diagnosis not present

## 2019-09-11 DIAGNOSIS — I350 Nonrheumatic aortic (valve) stenosis: Secondary | ICD-10-CM | POA: Diagnosis present

## 2019-09-11 DIAGNOSIS — I7121 Aneurysm of the ascending aorta, without rupture: Secondary | ICD-10-CM

## 2019-09-11 DIAGNOSIS — E785 Hyperlipidemia, unspecified: Secondary | ICD-10-CM | POA: Diagnosis present

## 2019-09-11 DIAGNOSIS — Z20828 Contact with and (suspected) exposure to other viral communicable diseases: Secondary | ICD-10-CM | POA: Diagnosis present

## 2019-09-11 DIAGNOSIS — Z87891 Personal history of nicotine dependence: Secondary | ICD-10-CM

## 2019-09-11 DIAGNOSIS — Z79899 Other long term (current) drug therapy: Secondary | ICD-10-CM

## 2019-09-11 DIAGNOSIS — G8929 Other chronic pain: Secondary | ICD-10-CM | POA: Diagnosis present

## 2019-09-11 DIAGNOSIS — R0602 Shortness of breath: Secondary | ICD-10-CM | POA: Diagnosis not present

## 2019-09-11 DIAGNOSIS — Z823 Family history of stroke: Secondary | ICD-10-CM

## 2019-09-11 DIAGNOSIS — Z7189 Other specified counseling: Secondary | ICD-10-CM | POA: Diagnosis not present

## 2019-09-11 DIAGNOSIS — Z888 Allergy status to other drugs, medicaments and biological substances status: Secondary | ICD-10-CM

## 2019-09-11 DIAGNOSIS — H9190 Unspecified hearing loss, unspecified ear: Secondary | ICD-10-CM | POA: Diagnosis present

## 2019-09-11 DIAGNOSIS — I495 Sick sinus syndrome: Secondary | ICD-10-CM | POA: Diagnosis not present

## 2019-09-11 DIAGNOSIS — I71019 Dissection of thoracic aorta, unspecified: Secondary | ICD-10-CM

## 2019-09-11 HISTORY — DX: Dissection of thoracic aorta, unspecified: I71.019

## 2019-09-11 HISTORY — DX: Aneurysm of the ascending aorta, without rupture: I71.21

## 2019-09-11 HISTORY — DX: Dissection of thoracic aorta: I71.01

## 2019-09-11 HISTORY — DX: Dissection of ascending aorta: I71.010

## 2019-09-11 HISTORY — DX: Thoracic aortic aneurysm, without rupture: I71.2

## 2019-09-11 LAB — BASIC METABOLIC PANEL
Anion gap: 9 (ref 5–15)
BUN: 7 mg/dL — ABNORMAL LOW (ref 8–23)
CO2: 29 mmol/L (ref 22–32)
Calcium: 9 mg/dL (ref 8.9–10.3)
Chloride: 88 mmol/L — ABNORMAL LOW (ref 98–111)
Creatinine, Ser: 0.9 mg/dL (ref 0.44–1.00)
GFR calc Af Amer: >60 mL/min (ref >60)
GFR calc non Af Amer: 60 mL/min — ABNORMAL LOW (ref >60)
Glucose, Bld: 131 mg/dL — ABNORMAL HIGH (ref 70–99)
Potassium: 3.9 mmol/L (ref 3.5–5.1)
Sodium: 126 mmol/L — ABNORMAL LOW (ref 135–145)

## 2019-09-11 LAB — CBC WITH DIFFERENTIAL/PLATELET
Abs Immature Granulocytes: 0.03 10*3/uL (ref 0.00–0.07)
Basophils Absolute: 0 10*3/uL (ref 0.0–0.1)
Basophils Relative: 1 %
Eosinophils Absolute: 0.1 10*3/uL (ref 0.0–0.5)
Eosinophils Relative: 1 %
HCT: 43 % (ref 36.0–46.0)
Hemoglobin: 14.1 g/dL (ref 12.0–15.0)
Immature Granulocytes: 0 %
Lymphocytes Relative: 13 %
Lymphs Abs: 0.9 10*3/uL (ref 0.7–4.0)
MCH: 29.6 pg (ref 26.0–34.0)
MCHC: 32.8 g/dL (ref 30.0–36.0)
MCV: 90.1 fL (ref 80.0–100.0)
Monocytes Absolute: 0.8 10*3/uL (ref 0.1–1.0)
Monocytes Relative: 11 %
Neutro Abs: 5.5 10*3/uL (ref 1.7–7.7)
Neutrophils Relative %: 74 %
Platelets: 252 10*3/uL (ref 150–400)
RBC: 4.77 MIL/uL (ref 3.87–5.11)
RDW: 13.2 % (ref 11.5–15.5)
WBC: 7.4 10*3/uL (ref 4.0–10.5)
nRBC: 0 % (ref 0.0–0.2)

## 2019-09-11 LAB — TROPONIN I (HIGH SENSITIVITY)
Troponin I (High Sensitivity): 18 ng/L — ABNORMAL HIGH (ref <18)
Troponin I (High Sensitivity): 17 ng/L (ref <18)

## 2019-09-11 LAB — SARS CORONAVIRUS 2 BY RT PCR (HOSPITAL ORDER, PERFORMED IN ~~LOC~~ HOSPITAL LAB): SARS Coronavirus 2: NEGATIVE

## 2019-09-11 LAB — MRSA PCR SCREENING: MRSA by PCR: NEGATIVE

## 2019-09-11 LAB — GLUCOSE, CAPILLARY: Glucose-Capillary: 134 mg/dL — ABNORMAL HIGH (ref 70–99)

## 2019-09-11 MED ORDER — TRAMADOL HCL 50 MG PO TABS
50.0000 mg | ORAL_TABLET | Freq: Four times a day (QID) | ORAL | Status: DC | PRN
  Administered 2019-09-11 – 2019-09-14 (×5): 50 mg via ORAL
  Filled 2019-09-11 (×5): qty 1

## 2019-09-11 MED ORDER — SODIUM CHLORIDE 0.9% FLUSH
3.0000 mL | Freq: Two times a day (BID) | INTRAVENOUS | Status: DC
  Administered 2019-09-11 – 2019-09-15 (×7): 3 mL via INTRAVENOUS

## 2019-09-11 MED ORDER — ACETAMINOPHEN 650 MG RE SUPP: 650.0000 mg | Freq: Four times a day (QID) | RECTAL | Status: DC | PRN

## 2019-09-11 MED ORDER — IOHEXOL 300 MG/ML  SOLN
100.0000 mL | Freq: Once | INTRAMUSCULAR | Status: AC | PRN
  Administered 2019-09-11: 11:00:00 75 mL via INTRAVENOUS

## 2019-09-11 MED ORDER — SODIUM CHLORIDE 0.45 % IV SOLN
INTRAVENOUS | Status: DC
  Administered 2019-09-11: 19:00:00 via INTRAVENOUS

## 2019-09-11 MED ORDER — ACETAMINOPHEN 325 MG PO TABS
650.0000 mg | ORAL_TABLET | Freq: Four times a day (QID) | ORAL | Status: DC | PRN
  Administered 2019-09-12: 650 mg via ORAL
  Filled 2019-09-11: qty 2

## 2019-09-11 MED ORDER — ONDANSETRON HCL 4 MG/2ML IJ SOLN: 4.0000 mg | Freq: Four times a day (QID) | INTRAMUSCULAR | Status: DC | PRN

## 2019-09-11 MED ORDER — ONDANSETRON HCL 4 MG PO TABS: 4.0000 mg | ORAL_TABLET | Freq: Four times a day (QID) | ORAL | Status: DC | PRN

## 2019-09-11 MED ORDER — NITROPRUSSIDE SODIUM-NACL 20-0.9 MG/100ML-% IV SOLN
0.0000 ug/kg/min | INTRAVENOUS | Status: DC
  Administered 2019-09-11: 19:00:00 0.3 ug/kg/min via INTRAVENOUS
  Filled 2019-09-11: qty 100

## 2019-09-11 MED ORDER — NITROPRUSSIDE SODIUM-NACL 10-0.9 MG/50ML-% IV SOLN
0.0000 ug/kg/min | INTRAVENOUS | Status: DC
  Filled 2019-09-11 (×2): qty 50

## 2019-09-11 MED ORDER — SENNOSIDES-DOCUSATE SODIUM 8.6-50 MG PO TABS
1.0000 | ORAL_TABLET | Freq: Every evening | ORAL | Status: DC | PRN
  Administered 2019-09-14 (×2): 1 via ORAL
  Filled 2019-09-11 (×2): qty 1

## 2019-09-11 MED ORDER — CHLORHEXIDINE GLUCONATE CLOTH 2 % EX PADS
6.0000 | MEDICATED_PAD | Freq: Every day | CUTANEOUS | Status: DC
  Administered 2019-09-11 – 2019-09-15 (×5): 6 via TOPICAL

## 2019-09-11 NOTE — ED Provider Notes (Signed)
[facility] EMERGENCY DEPARTMENT Provider Note   CSN: [id number] Arrival date & time: 09/11/19  1453     History   Chief Complaint Chief Complaint  Patient presents with  . Thoracic Aortic Dissection    HPI Laurie [name] is a 84 y.o. female with PMH/o AAA, Afrib, COPD, HA who presents for evaluation of known thoracic aortic dissection.  Patient got a chest CT today that was reviewed by cardiology noted to have a new dissection as well as increasing 7 cm aneurysm.  Given findings, Dr. Stanford Breed sent patient to the emergency department.  Patient states that she has "a funny feeling in her chest."  She has difficulty describing exactly what the pain is to me.  She does report that over the last several weeks, she has had pain in her legs and some intermittent shortness of breath.  She states she is currently not having any chest pain.  She has not been sick recently with fevers, cough, congestion.  Denies any leg swelling.     The history is provided by the patient.    Past Medical History:  Diagnosis Date  . AAA (abdominal aortic aneurysm) ([place])   . Anemia    iron deficiency  . Ascending aorta dilatation (HCC)   . Atrial fibrillation ([place])    post op  . CAD (coronary artery disease)   . Carcinoma of lung ([place]) 08/28/2008   T2N0 moderately diff. squamous cell CA  . Cerebrovascular disease   . COPD (chronic obstructive pulmonary disease) ([place]) 2005   on CXR with R apical scaring  . Headache(784.0)    Hx: of migraines in the past  . Hypercholesterolemia    Hx: of  . Hypertension   . Incidental pulmonary nodule, > 10mm and < 37mm    LLL  . S/P aortic valve replacement 08/28/2008   [facility]  . S/P lobectomy of lung 08/28/2008   rt middle lobe  dr. Roxy Manns  . Shingles 11-09    Patient Active Problem List   Diagnosis Date Noted  . Adrenal nodule ([place]) 08/29/2019  . Pacemaker 07/20/2019  . Constipation 07/20/2019  . Chronic RLQ pain 07/20/2019  . Chronic  right hip pain 06/23/2019  . Rib fracture 12/19/2018  . Fall 12/17/2018  . Tachy-brady syndrome ([place]) 12/05/2018  . Low grade squamous intraepithelial lesion (LGSIL) on cervicovaginal cytologic smear 04/08/2018  . Paroxysmal atrial fibrillation ([place]) 02/25/2018  . Sinus bradycardia 02/25/2018  . Incidental pulmonary nodule, > 25mm and < 21mm   . Claudication ([place]) 04/22/2016  . Dyspnea   . Atrial fibrillation with RVR ([place])   . COPD (chronic obstructive pulmonary disease) ([place])   . RUQ abdominal pain   . Biliary calculus   . Pulmonary emphysema ([place])   . Aortic valve disease   . Thoracic aortic aneurysm without rupture ([place]) 08/19/2015  . Ascending aorta dilatation (HCC)   . Abdominal aortic aneurysm (AAA) 3.0 cm to 5.0 cm in diameter in female ([place]) 08/26/2011  . HYPERGLYCEMIA, MILD 11/10/2010  . OSTEOPENIA 09/29/2010  . Coronary atherosclerosis 05/15/2009  . Bilateral carotid artery disease ([place]) 05/15/2009  . TIA 05/15/2009  . ILIAC ARTERY ANEURYSM 05/15/2009  . GAD (generalized anxiety disorder) 03/20/2009  . THYROID NODULE 01/09/2009  . THYROMEGALY 01/08/2009  . SIALOLITHIASIS 01/08/2009  . Malignant neoplasm of middle lobe, bronchus or lung ([place]) 10/24/2008  . HLD (hyperlipidemia) 10/24/2008  . AORTIC VALVE REPLACEMENT, HX OF 10/24/2008  . S/P aortic valve replacement 08/28/2008  . DIVERTICULOSIS  OF COLON 06/19/2008  . ANEMIA, IRON DEFICIENCY 06/06/2008  . Essential hypertension, benign 06/06/2008  . GASTROINTESTINAL HEMORRHAGE, HX OF 06/06/2008    Past Surgical History:  Procedure Laterality Date  . ABDOMINAL SURGERY    . AORTIC VALVE REPLACEMENT  08/28/2008   #71mm [facility] Ease pericardial tissue valve  . APPENDECTOMY    . CARDIAC CATHETERIZATION    . CATARACT EXTRACTION W/ INTRAOCULAR LENS  IMPLANT, BILATERAL Bilateral   . CHOLECYSTECTOMY N/A 12/13/2013   Procedure: LAPAROSCOPIC CHOLECYSTECTOMY;  Surgeon: Ralene Ok, MD;  Location: [facility];  Service:  General;  Laterality: N/A;  . COLONOSCOPY     \  . DILATION AND CURETTAGE OF UTERUS    . ERCP N/A 05/08/2015   Procedure: ENDOSCOPIC RETROGRADE CHOLANGIOPANCREATOGRAPHY (ERCP);  Surgeon: Inda Castle, MD;  Location: [facility];  Service: Endoscopy;  Laterality: N/A;  . ERCP N/A 01/13/2016   Procedure: ENDOSCOPIC RETROGRADE CHOLANGIOPANCREATOGRAPHY (ERCP);  Surgeon: Doran Stabler, MD;  Location: [facility];  Service: Endoscopy;  Laterality: N/A;  . ERCP N/A 03/09/2016   Procedure: ENDOSCOPIC RETROGRADE CHOLANGIOPANCREATOGRAPHY (ERCP);  Surgeon: Ladene Artist, MD;  Location: [facility];  Service: Endoscopy;  Laterality: N/A;  . PACEMAKER IMPLANT N/A 12/05/2018   Procedure: PACEMAKER IMPLANT;  Surgeon: Evans Lance, MD;  Location: [facility];  Service: Cardiovascular;  Laterality: N/A;  . RML removed Dr Ricard Dillon for lung cancer  08/28/2008   T2N0 squamous cell CA  . TONSILLECTOMY AND ADENOIDECTOMY    . u/s guided aspiration of R breast cyst   2009   at the breast clinic     OB History    Gravida  2   Para  2   Term      Preterm      AB      Living        SAB      TAB      Ectopic      Multiple      Live Births  2            Home Medications    Prior to Admission medications   Medication Sig Start Date End Date Taking? Authorizing Provider  acetaminophen (TYLENOL) 500 MG tablet Take 2 tablets (1,000 mg total) by mouth every 8 (eight) hours as needed (pain). 04/04/19  Yes Emeterio Reeve, DO  albuterol (PROVENTIL) (2.5 MG/3ML) 0.083% nebulizer solution Take 3 mLs (2.5 mg total) by nebulization every 4 (four) hours as needed for wheezing or shortness of breath. 12/01/18  Yes Phelps, Erin O, PA-C  ALPRAZolam (XANAX) 0.5 MG tablet TAKE 1 TABLET (0.5 MG TOTAL) BY MOUTH 2 (TWO) TIMES DAILY AS NEEDED FOR ANXIETY. TAKE 0.5-1 TABLETS (0.25-5 MG TOTAL) BY MOUTH 2 (TWO) TIMES DAILY AS NEEDED. Patient taking differently: Take 0.5-1 mg by mouth 2 (two) times  daily as needed for anxiety.  08/25/19  Yes Hali Marry, MD  amiodarone (PACERONE) 200 MG tablet Take 1 tablet (200 mg total) by mouth daily. 01/27/19  Yes Lelon Perla, MD  lisinopril (ZESTRIL) 5 MG tablet Take 1 tablet (5 mg total) by mouth daily. 08/23/19  Yes Crenshaw, Denice Bors, MD  XARELTO 20 MG TABS tablet TAKE 1 TABLET (20 MG TOTAL) BY MOUTH DAILY WITH SUPPER. Patient taking differently: Take 20 mg by mouth daily with supper. High risk med: Anticoagulant.  Crushed Xarelto can be given down a G-tube but NOT a J-Tube. 07/17/19  Yes Lelon Perla, MD  AMBULATORY NON FORMULARY MEDICATION Nebulizer use as needed. Dx COPD J44.9 09/06/19   Hali Marry, MD  furosemide (LASIX) 20 MG tablet Take 1 tablet (20 mg total) by mouth as needed for edema (for leg swelling). Patient not taking: Reported on 09/11/2019 09/04/19 12/03/19  Lelon Perla, MD  HYDROcodone-acetaminophen (NORCO/VICODIN) 5-325 MG tablet Take 1 tablet by mouth 3 (three) times daily as needed for moderate pain. Patient not taking: Reported on 09/11/2019 09/08/19   Gregor Hams, MD    Family History Family History  Problem Relation Age of Onset  . Stroke Father   . Diabetes Son        brittle diabetes  . Colon polyps Neg Hx   . Colon cancer Neg Hx   . Esophageal cancer Neg Hx   . Stomach cancer Neg Hx   . Pancreatic cancer Neg Hx   . Kidney disease Neg Hx   . Liver disease Neg Hx     Social History Social History   Tobacco Use  . Smoking status: Former Smoker    Years: 40.00  . Smokeless tobacco: Never Used  Substance Use Topics  . Alcohol use: No    Alcohol/week: 0.0 standard drinks  . Drug use: No     Allergies   Hyoscyamine, Rofecoxib, Statins, and Metoprolol   Review of Systems Review of Systems  Constitutional: Negative for fever.  Respiratory: Positive for shortness of breath. Negative for cough.   Cardiovascular: Negative for chest pain.  Gastrointestinal: Negative for  abdominal pain, nausea and vomiting.  Genitourinary: Negative for dysuria and hematuria.  Neurological: Negative for headaches.  All other systems reviewed and are negative.    Physical Exam Updated Vital Signs BP 123/65   Pulse 60   Resp 16   Ht 5\' 6"  (1.676 m)   Wt 79.9 kg   SpO2 97%   BMI 28.44 kg/m   Physical Exam Vitals signs and nursing note reviewed.  Constitutional:      Appearance: Normal appearance. She is well-developed.  HENT:     Head: Normocephalic and atraumatic.  Eyes:     General: Lids are normal.     Conjunctiva/sclera: Conjunctivae normal.     Pupils: Pupils are equal, round, and reactive to light.  Neck:     Musculoskeletal: Full passive range of motion without pain.  Cardiovascular:     Rate and Rhythm: Normal rate and regular rhythm.     Pulses:          Radial pulses are 2+ on the right side and 2+ on the left side.       Dorsalis pedis pulses are 2+ on the right side and 1+ on the left side.     Heart sounds: Normal heart sounds. No murmur. No friction rub. No gallop.   Pulmonary:     Effort: Pulmonary effort is normal.     Breath sounds: Normal breath sounds.     Comments: Lungs clear to auscultation bilaterally.  Symmetric chest rise.  No wheezing, rales, rhonchi. Abdominal:     Palpations: Abdomen is soft. Abdomen is not rigid.     Tenderness: There is no abdominal tenderness. There is no guarding.  Musculoskeletal: Normal range of motion.  Skin:    General: Skin is warm and dry.     Capillary Refill: Capillary refill takes less than 2 seconds.     Comments: Left lower extremity appears slightly cooler to touch than right lower extremity.  Neurological:  Mental Status: She is alert and oriented to person, place, and time.  Psychiatric:        Speech: Speech normal.      ED Treatments / Results  Labs (all labs ordered are listed, but only abnormal results are displayed) Labs Reviewed  BASIC METABOLIC PANEL - Abnormal; Notable  for the following components:      Result Value   Sodium 126 (*)    Chloride 88 (*)    Glucose, Bld 131 (*)    BUN 7 (*)    GFR calc non Af Amer 60 (*)    All other components within normal limits  TROPONIN I (HIGH SENSITIVITY) - Abnormal; Notable for the following components:   Troponin I (High Sensitivity) 18 (*)    All other components within normal limits  SARS CORONAVIRUS 2 BY RT PCR (HOSPITAL ORDER, [facility])  CBC WITH DIFFERENTIAL/PLATELET  TROPONIN I (HIGH SENSITIVITY)    EKG None  Radiology Ct Chest W Contrast  Result Date: 09/11/2019 CLINICAL DATA:  Increased prominence of the ascending thoracic aortic contour by chest x-ray with known aneurysmal disease of the ascending thoracic aorta and history of prior aortic valve replacement in 2009. Bilateral lower extremity pain. EXAM: CT CHEST WITH CONTRAST TECHNIQUE: Multidetector CT imaging of the chest was performed during intravenous contrast administration. CONTRAST:  68mL OMNIPAQUE IOHEXOL 300 MG/ML  SOLN COMPARISON:  CT of the chest without contrast on 12/17/2018 and CTA of the chest on 08/24/2016 FINDINGS: Cardiovascular: Aortic valve plane shows stable appearance of a prosthetic aortic valve. Just above the valve plane, there is significant enlargement of the ascending thoracic aorta since prior studies which now measures 7-7.1 cm in greatest diameter. There is an associated dissection with irregular intimal flap now extending in the lumen up to the level of the proximal arch. This approaches the origin of the innominate artery but does not visibly enter the origin. The dissection does not involve the rest of the arch or the descending thoracic aorta. No evidence of associated mediastinal or pleural hemorrhage. The proximal arch measures approximately 3.9 cm and the distal arch 3.3 cm. The mid descending thoracic aorta measures 2.8 cm. There is slight bulge in contour of the distal descending thoracic  aorta which measures up to 3.7 cm in greatest diameter before tapering to 2.9 cm just above the diaphragmatic hiatus. The heart size is stable and within normal limits. Stable appearance of a dual-chamber pacemaker. No pericardial fluid identified. Stable calcified coronary artery plaque. Mediastinum/Nodes: No enlarged mediastinal, hilar, or axillary lymph nodes. Thyroid gland, trachea, and esophagus demonstrate no significant findings. Lungs/Pleura: Stable chronic lung disease with pulmonary scarring, right posterior pleural thickening, calcified pleural plaque and areas of pulmonary nodularity including a stable 7 mm posterior left lower lobe nodule. There is no evidence of pulmonary edema, consolidation, pneumothorax or pleural fluid. Upper Abdomen: Stable pneumobilia. Musculoskeletal: No chest wall abnormality. No acute or significant osseous findings. 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 The ascending thoracic aorta now measures 7-7.1 cm in greatest diameter. These results will be called to the ordering clinician or representative by the Radiologist Assistant, and communication documented in the PACS or zVision Dashboard. Electronically Signed   By: Aletta Edouard M.D.   On: 09/11/2019 12:01    Procedures .Critical Care Performed by: Volanda Napoleon, PA-C Authorized by:  Volanda Napoleon, PA-C   Critical care provider statement:    Critical care time (minutes):  45   Critical care was necessary to treat or prevent imminent or life-threatening deterioration of the following conditions: Aortic Dissection.   Critical care was time spent personally by me on the following activities:  Discussions with consultants, evaluation of patient's response to treatment, examination of patient, ordering and performing treatments and interventions, ordering and  review of laboratory studies, ordering and review of radiographic studies, pulse oximetry, re-evaluation of patient's condition, obtaining history from patient or surrogate and review of old charts   (including critical care time)  Medications Ordered in ED Medications - No data to display   Initial Impression / Assessment and Plan / ED Course  I have reviewed the triage vital signs and the nursing notes.  Pertinent labs & imaging results that were available during my care of the patient were reviewed by me and considered in my medical decision making (see chart for details).        84 year old female who presents for evaluation of known dissection.  Had an outpatient CT done today that showed concerns for dissection as well as worsening aneurysm.  She was sent emergently to the ED.  Patient reports she has had a "funny feeling in her chest.  She also has had some intermittent shortness of breath and leg pain.  On initial ED arrival, she is afebrile, blood pressures controlled and heart rate was in the normal limits.  She has good radial pulses.  She has slightly diminished DP pulses on the left side.  She her left lower extremity slightly cool to touch but is not dusky in appearance.  We will plan for labs, consult to cardiothoracic surgeon.  Discussed patient with Dr. Julien Girt (cardiothoracic). Will plan to see patient in the ED.   She had a CT with contrast done today showed significant enlargement of ascending thoracic aorta since prior imaging with new evidence of a type a dissection of the ascending thoracic aorta extending to the proximal arch.  Ascending thoracic aorta now measures 7-7.1 cm.  CBC shows no acute leukocytosis or anemia.  BMP shows sodium 126, bicarb 29, BUN is 7.  Troponin is elevated 18.  Cardiothoracic is at bedside.  Discussed patient with Dr. Julien Girt (Cardiothoracic) after evaluation here in the ED.  He is not going to take her to the OR.  He would like her admitted  to [facility] ICU.  Portions of this note were generated with [employer]obby[employer]. Dictation errors may occur despite best attempts at proofreading.  Final Clinical Impressions(s) / ED Diagnoses   Final diagnoses:  Acute thoracic aortic dissection [facility])    ED Discharge Orders    None       Volanda Napoleon, PA-C 09/11/19 1657    Elnora Morrison, MD 09/12/19 2351

## 2019-09-11 NOTE — ED Provider Notes (Signed)
Shared service with APP.  I have personally seen and examined the patient, providing direct face to face care.  Physical exam findings and plan include patient sent over for concern for dissection on CT angiogram.  Patient's had some mild chest discomfort and abdominal discomfort recently.  Patient is blood pressure fortunately normal at this time.  Physician assistant consulted cardiothoracic surgery.  Patient be monitored closely emergency room.  Difficult IV bedside ultrasound IV placed by myself.  .Critical Care Performed by: Elnora Morrison, MD Authorized by: Elnora Morrison, MD   Critical care provider statement:    Critical care time (minutes):  35   Critical care start time:  09/11/2019 3:20 PM   Critical care end time:  09/11/2019 3:55 PM   Critical care time was exclusive of:  Separately billable procedures and treating other patients and teaching time   Critical care was time spent personally by me on the following activities:  Discussions with consultants, examination of patient, ordering and performing treatments and interventions, ordering and review of laboratory studies, ordering and review of radiographic studies, pulse oximetry, re-evaluation of patient's condition and obtaining history from patient or surrogate Ultrasound ED Peripheral IV (Provider)  Date/Time: 09/11/2019 3:53 PM Performed by: Elnora Morrison, MD Authorized by: Elnora Morrison, MD   Procedure details:    Indications: poor IV access     Skin Prep: chlorhexidine gluconate     Location:  Right AC   Angiocath:  18 G   Bedside Ultrasound Guided: Yes     Images: archived     Patient tolerated procedure without complications: Yes     Aortic dissection  No diagnosis found.      Elnora Morrison, MD 09/12/19 2351

## 2019-09-11 NOTE — Telephone Encounter (Signed)
Incoming call from scheduler transferred to triage nurse. Olivia Mackie from [facility] Radiology to report STAT results of pt chest CT today: "IMPRESSION: Significant enlargement of the ascending thoracic aorta since prior imaging with new evidence of a type A dissection of the ascending thoracic aorta extending to the proximal arch. The ascending thoracic aorta now measures 7-7.1 cm in greatest diameter"  Informed her that Dr. Stanford Breed not in office today but message would be routed to him to make him aware. She states that number to reach is [phone]

## 2019-09-11 NOTE — Consult Note (Addendum)
[address]       [place] [address]             [phone]          CARDIOTHORACIC SURGERY CONSULTATION REPORT  PCP is Hali Marry, MD Referring Provider is B. Murvin Natal, MD Primary Cardiologist is [name], MD  Reason for consultation:  Aortic dissection  HPI:  Patient is an 84 year old female who is well-known to me with history of multiple medical problems who has been referred for second opinion regarding possible management options for newly discovered type A aortic dissection.  Patient originally presented with severe symptomatic aortic stenosis and was coincidentally found to have a lung mass at the time.  She underwent aortic valve replacement using a bioprosthetic tissue valve, septal myomectomy, and right middle lobectomy on August 28, 2008.  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  She did well from a cardiac standpoint and has been followed regularly ever since for long-term cancer surveillance.  She was last seen in our office on Apr 18, 2018 at which time she remained clinically stable and follow-up CT scan revealed stable benign-appearing nodule in the left lung and no other signs of locally recurrent or distant metastatic disease.  Patient has been noted to have stable mild fusiform aneurysmal enlargement of the ascending thoracic aorta with maximum transverse diameter measured 4.7 cm at that time.  She also had stable 3.7 cm infrarenal abdominal aortic aneurysm.  Clinically the patient's physical mobility was notably diminished and she continued to complain of significant chronic back pain, which has been slowly getting worse for several years.  Comorbid medical problems include PAD with significant LE arterial insufficiency, recurrent PAF on long-term anticoagulation using Xarelto, SSS and post-termination pauses s/p permanent  pacemaker placement, and long-standing hypertension.  Most recent echocardiogram performed 04/19/2019 revealed normal LV systolic function and normal functioning bioprosthetic tissue valve in the aortic position.   Patient complains that over the last 2 or 3 weeks she has had significant pain and weakness in both lower legs.  She denies any chest pain or shortness of breath.  She has chronic back pain.  She reports this is no different.  She has had some pain in her right side and reportedly suffered a fall recently.  She was seen in the office recently at her primary care physician's office on 08/29/2019 and more recently at [facility] by Coletta Memos on 09/06/2019.  She underwent elective outpatient CT scan of the chest earlier today which revealed a new type A aortic dissection and severe enlargement of the ascending thoracic aorta with maximum transverse diameter approximately 7 cm.  Patient was sent directly to the emergency department and has been evaluated previously by Dr. Orvan Seen.  Per the patient's request I was asked to come by for a second opinion.  The patient is widowed and lives alone locally in [place].  She has limited mobility but has still remained functionally independent.  She gets tired easily.  She has significant chronic back pain.  At present she denies any pain in her chest or back.  She denies SOB.  She still has some discomfort in her legs but this has eased.    Past Medical History:  Diagnosis Date   AAA (abdominal aortic aneurysm) (HCC)    Anemia    iron deficiency   Ascending aorta dilatation ([place])  Atrial fibrillation ([place])    post op   CAD (coronary artery disease)    Carcinoma of lung ([facility]) 08/28/2008   T2N0 moderately diff. squamous cell CA   Cerebrovascular disease    COPD (chronic obstructive pulmonary disease) ([place]) 2005   on CXR with R apical scaring   Headache(784.0)    Hx: of migraines in the past   Hypercholesterolemia    Hx: of    Hypertension    Incidental pulmonary nodule, > 29mm and < 28mm    LLL   S/P aortic valve replacement 08/28/2008   [facility]   S/P lobectomy of lung 08/28/2008   rt middle lobe  dr. Roxy Manns   Shingles 11-09    Past Surgical History:  Procedure Laterality Date   ABDOMINAL SURGERY     AORTIC VALVE REPLACEMENT  08/28/2008   #69mm Edwards Magna Ease pericardial tissue valve   APPENDECTOMY     CARDIAC CATHETERIZATION     CATARACT EXTRACTION W/ INTRAOCULAR LENS  IMPLANT, BILATERAL Bilateral    CHOLECYSTECTOMY N/A 12/13/2013   Procedure: LAPAROSCOPIC CHOLECYSTECTOMY;  Surgeon: Ralene Ok, MD;  Location: [facility];  Service: General;  Laterality: N/A;   COLONOSCOPY     \   DILATION AND CURETTAGE OF UTERUS     ERCP N/A 05/08/2015   Procedure: ENDOSCOPIC RETROGRADE CHOLANGIOPANCREATOGRAPHY (ERCP);  Surgeon: Inda Castle, MD;  Location: [facility];  Service: Endoscopy;  Laterality: N/A;   ERCP N/A 01/13/2016   Procedure: ENDOSCOPIC RETROGRADE CHOLANGIOPANCREATOGRAPHY (ERCP);  Surgeon: Doran Stabler, MD;  Location: [facility];  Service: Endoscopy;  Laterality: N/A;   ERCP N/A 03/09/2016   Procedure: ENDOSCOPIC RETROGRADE CHOLANGIOPANCREATOGRAPHY (ERCP);  Surgeon: Ladene Artist, MD;  Location: [facility];  Service: Endoscopy;  Laterality: N/A;   PACEMAKER IMPLANT N/A 12/05/2018   Procedure: PACEMAKER IMPLANT;  Surgeon: Evans Lance, MD;  Location: [facility];  Service: Cardiovascular;  Laterality: N/A;   RML removed Dr Ricard Dillon for lung cancer  08/28/2008   T2N0 squamous cell CA   TONSILLECTOMY AND ADENOIDECTOMY     u/s guided aspiration of R breast cyst   2009   at the breast clinic    Family History  Problem Relation Age of Onset   Stroke Father    Diabetes Son        brittle diabetes   Colon polyps Neg Hx    Colon cancer Neg Hx    Esophageal cancer Neg Hx    Stomach cancer Neg Hx    Pancreatic cancer Neg Hx    Kidney disease Neg Hx    Liver  disease Neg Hx     Social History   Socioeconomic History   Marital status: Single    Spouse name: Not on file   Number of children: Not on file   Years of education: Not on file   Highest education level: Not on file  Occupational History   Not on file  Social Needs   Financial resource strain: Not on file   Food insecurity    Worry: Not on file    Inability: Not on file   Transportation needs    Medical: Not on file    Non-medical: Not on file  Tobacco Use   Smoking status: Former Smoker    Years: 40.00   Smokeless tobacco: Never Used  Substance and Sexual Activity   Alcohol use: No    Alcohol/week: 0.0 standard drinks   Drug use: No   Sexual activity: Not  on file  Lifestyle   Physical activity    Days per week: Not on file    Minutes per session: Not on file   Stress: Not on file  Relationships   Social connections    Talks on phone: Not on file    Gets together: Not on file    Attends religious service: Not on file    Active member of club or organization: Not on file    Attends meetings of clubs or organizations: Not on file    Relationship status: Not on file   Intimate partner violence    Fear of current or ex partner: Not on file    Emotionally abused: Not on file    Physically abused: Not on file    Forced sexual activity: Not on file  Other Topics Concern   Not on file  Social History Narrative   Not on file    Prior to Admission medications   Medication Sig Start Date End Date Taking? Authorizing Provider  acetaminophen (TYLENOL) 500 MG tablet Take 2 tablets (1,000 mg total) by mouth every 8 (eight) hours as needed (pain). 04/04/19  Yes Emeterio Reeve, DO  albuterol (PROVENTIL) (2.5 MG/3ML) 0.083% nebulizer solution Take 3 mLs (2.5 mg total) by nebulization every 4 (four) hours as needed for wheezing or shortness of breath. 12/01/18  Yes Phelps, Erin O, PA-C  ALPRAZolam (XANAX) 0.5 MG tablet TAKE 1 TABLET (0.5 MG TOTAL) BY MOUTH  2 (TWO) TIMES DAILY AS NEEDED FOR ANXIETY. TAKE 0.5-1 TABLETS (0.25-5 MG TOTAL) BY MOUTH 2 (TWO) TIMES DAILY AS NEEDED. Patient taking differently: Take 0.5-1 mg by mouth 2 (two) times daily as needed for anxiety.  08/25/19  Yes Hali Marry, MD  amiodarone (PACERONE) 200 MG tablet Take 1 tablet (200 mg total) by mouth daily. 01/27/19  Yes Lelon Perla, MD  lisinopril (ZESTRIL) 5 MG tablet Take 1 tablet (5 mg total) by mouth daily. 08/23/19  Yes Crenshaw, Denice Bors, MD  XARELTO 20 MG TABS tablet TAKE 1 TABLET (20 MG TOTAL) BY MOUTH DAILY WITH SUPPER. Patient taking differently: Take 20 mg by mouth daily with supper. High risk med: Anticoagulant.  Crushed Xarelto can be given down a G-tube but NOT a J-Tube. 07/17/19  Yes Crenshaw, Denice Bors, MD  AMBULATORY NON FORMULARY MEDICATION Nebulizer use as needed. Dx COPD J44.9 09/06/19   Hali Marry, MD  furosemide (LASIX) 20 MG tablet Take 1 tablet (20 mg total) by mouth as needed for edema (for leg swelling). Patient not taking: Reported on 09/11/2019 09/04/19 12/03/19  Lelon Perla, MD  HYDROcodone-acetaminophen (NORCO/VICODIN) 5-325 MG tablet Take 1 tablet by mouth 3 (three) times daily as needed for moderate pain. Patient not taking: Reported on 09/11/2019 09/08/19   Gregor Hams, MD    Current Facility-Administered Medications  Medication Dose Route Frequency Provider Last Rate Last Dose   0.45 % sodium chloride infusion   Intravenous Continuous Barrett, Erin R, PA-C 10 mL/hr at 09/11/19 1850     acetaminophen (TYLENOL) tablet 650 mg  650 mg Oral Q6H PRN Barrett, Erin R, PA-C       Or   acetaminophen (TYLENOL) suppository 650 mg  650 mg Rectal Q6H PRN Barrett, Erin R, PA-C       Chlorhexidine Gluconate Cloth 2 % PADS 6 each  6 each Topical Daily Wonda Olds, MD   6 each at 09/11/19 1850   nitroPRUSSide (NIPRIDE) 20 mg in NS 100 mL (0.2 mg/mL)  infusion  0-1 mcg/kg/min Intravenous Titrated Hammons, Kimberly B, RPH 7.19  mL/hr at 09/11/19 1919 0.3 mcg/kg/min at 09/11/19 1919   ondansetron (ZOFRAN) tablet 4 mg  4 mg Oral Q6H PRN Barrett, Erin R, PA-C       Or   ondansetron (ZOFRAN) injection 4 mg  4 mg Intravenous Q6H PRN Barrett, Erin R, PA-C       senna-docusate (Senokot-S) tablet 1 tablet  1 tablet Oral QHS PRN Barrett, Erin R, PA-C       sodium chloride flush (NS) 0.9 % injection 3 mL  3 mL Intravenous Q12H Barrett, Erin R, PA-C       traMADol (ULTRAM) tablet 50 mg  50 mg Oral Q6H PRN Barrett, Erin R, PA-C        Allergies  Allergen Reactions   Hyoscyamine Other (See Comments)    Mental status changes.  (as of 02/13/16 patient denies any problems with this medication)   Rofecoxib Anaphylaxis and Hives   Statins Other (See Comments)    Muscle aches   Metoprolol Other (See Comments)    Extreme fatigue, weakness and brady      Review of Systems:  Per H+P     Physical Exam:   BP (!) 166/69    Pulse 64    Temp 98 F (36.7 C) (Oral)    Resp 15    Ht 5\' 6"  (1.676 m)    Wt 79.9 kg    SpO2 95%    BMI 28.44 kg/m   General:  Elderly, somewhat frail-appearing  HEENT:  Unremarkable   Neck:   no JVD, no bruits, no adenopathy   Chest:   clear to auscultation, symmetrical breath sounds, no wheezes, no rhonchi   CV:   RRR, grade II/VI sysolic murmur   Abdomen:  soft, non-tender, no masses   Extremities:  warm, well-perfused, pulses diminished, no lower extremity edema  Rectal/GU  Deferred  Neuro:   Grossly non-focal and symmetrical throughout  Skin:   Clean and dry, no rashes, no breakdown  Diagnostic Tests:  Lab Results: Recent Labs    09/11/19 1536  WBC 7.4  HGB 14.1  HCT 43.0  PLT 252   BMET:  Recent Labs    09/11/19 1536  NA 126*  K 3.9  CL 88*  CO2 29  GLUCOSE 131*  BUN 7*  CREATININE 0.90  CALCIUM 9.0    CBG (last 3)  No results for input(s): GLUCAP in the last 72 hours. PT/INR:  No results for input(s): LABPROT, INR in the last 72 hours.  CTA  CT CHEST WITH  CONTRAST  TECHNIQUE: Multidetector CT imaging of the chest was performed during intravenous contrast administration.  CONTRAST:  44mL OMNIPAQUE IOHEXOL 300 MG/ML  SOLN  COMPARISON:  CT of the chest without contrast on 12/17/2018 and CTA of the chest on 08/24/2016  FINDINGS: Cardiovascular: Aortic valve plane shows stable appearance of a prosthetic aortic valve. Just above the valve plane, there is significant enlargement of the ascending thoracic aorta since prior studies which now measures 7-7.1 cm in greatest diameter. There is an associated dissection with irregular intimal flap now extending in the lumen up to the level of the proximal arch. This approaches the origin of the innominate artery but does not visibly enter the origin. The dissection does not involve the rest of the arch or the descending thoracic aorta. No evidence of associated mediastinal or pleural hemorrhage.  The proximal arch measures approximately 3.9 cm and the distal  arch 3.3 cm. The mid descending thoracic aorta measures 2.8 cm. There is slight bulge in contour of the distal descending thoracic aorta which measures up to 3.7 cm in greatest diameter before tapering to 2.9 cm just above the diaphragmatic hiatus.  The heart size is stable and within normal limits. Stable appearance of a dual-chamber pacemaker. No pericardial fluid identified. Stable calcified coronary artery plaque.  Mediastinum/Nodes: No enlarged mediastinal, hilar, or axillary lymph nodes. Thyroid gland, trachea, and esophagus demonstrate no significant findings.  Lungs/Pleura: Stable chronic lung disease with pulmonary scarring, right posterior pleural thickening, calcified pleural plaque and areas of pulmonary nodularity including a stable 7 mm posterior left lower lobe nodule. There is no evidence of pulmonary edema, consolidation, pneumothorax or pleural fluid.  Upper Abdomen: Stable pneumobilia.  Musculoskeletal:  No chest wall abnormality. No acute or significant osseous findings.  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 The ascending thoracic aorta now measures 7-7.1 cm in greatest diameter.  These results will be called to the ordering clinician or representative by the Radiologist Assistant, and communication documented in the PACS or zVision Dashboard.   Electronically Signed   By: Aletta Edouard M.D.   On: 09/11/2019 12:01    Impression:  Patient has an acute or subacute type A aortic dissection (DeBakey Type II) with new aneurysmal enlargement of the ascending thoracic aorta.  She is not having significant pain in her chest or upper back but complains of relatively recent onset of pain and weakness in both lower legs over the last few weeks.  There are no clinical signs of lower extremity ischemia and her dissection flap does not extend beyond the proximal transverse aortic arch.  However, it remains possible that her recent symptoms are related to her aortic dissection.  At present she remains clinically stable in sinus rhythm with normal blood pressure.  She is chronically anticoagulated using Xarelto.  I have personally reviewed the patient's CT angiogram of the chest which reveals findings as noted above.  The patient also has significant calcification throughout the wall of the thoracic aorta including the distal ascending aorta and transverse aortic arch.  There is no sign of any pericardial effusion nor periaortic fluid, and proximally the dissection flap appears to stop well above the aortic valve prosthesis.  The distal extent of the dissection appears to stop at the level of the innominate artery and does not involve the distal transverse arch nor the descending thoracic aorta.  I would be very reluctant to consider an attempt  at surgical repair in this elderly patient with multiple medical problems who underwent previous median sternotomy for aortic valve replacement and right middle lobectomy in the distant past, and I would absolutely not consider her a candidate for surgery in the setting of ongoing systemic anticoagulation.  Pharmacologic reversal of Xarelto could be considered, but even in the absence of systemic anticoagulation risks associated with surgery would be extremely high, potentially prohibitive.  Anticipated risks of mortality would likely approach 50% and the likelihood that the patient would completely recover to regain her previous functional independence would be relatively low.    Plan:  I discussed the nature of the patient's problem at length with the patient at the bedside this evening.  Her family is not currently present for consultation.  Prior to our discussion the patient seem to have limited understanding of  the gravity of her clinical condition, and I am still not convinced she fully comprehends.  I have known this patient for a long time and witnessed her gradual decline over the last several years.  I have suggested that we plan to discuss treatment options further again in the morning when her family can be present.  All of her questions have been addressed.   I spent in excess of 90 minutes during the conduct of this hospital consultation and >50% of this time involved direct face-to-face encounter for counseling and/or coordination of the patient's care.    Valentina Gu. Roxy Manns, MD 09/11/2019 7:45 PM

## 2019-09-11 NOTE — ED Notes (Signed)
ED TO INPATIENT HANDOFF REPORT  ED Nurse Name and Phone #:   S Name/Age/Gender Laurie [name] 84 y.o. female Room/Bed: 040C/040C  Code Status   Code Status: Prior  Home/SNF/Other Home Patient oriented to: self, place, time and situation Is this baseline? Yes   Triage Complete: Triage complete  Chief Complaint AORTA ISSUE SENT FROM [facility]  Triage Note Patient states she was sent due to CTA showing a dissection. Patient c/o her heart feeling funny.    Allergies Allergies  Allergen Reactions  . Hyoscyamine Other (See Comments)    Mental status changes.  (as of 02/13/16 patient denies any problems with this medication)  . Rofecoxib Anaphylaxis and Hives  . Statins Other (See Comments)    Muscle aches  . Metoprolol Other (See Comments)    Extreme fatigue, weakness and brady    Level of Care/Admitting Diagnosis ED Disposition    ED Disposition Condition Comment   Admit  The patient appears reasonably stabilized for admission considering the current resources, flow, and capabilities available in the ED at this time, and I doubt any other [facility] requiring further screening and/or treatment in the ED prior to admission is  present.       B Medical/Surgery History Past Medical History:  Diagnosis Date  . AAA (abdominal aortic aneurysm) ([place])   . Anemia    iron deficiency  . Ascending aorta dilatation (HCC)   . Atrial fibrillation ([place])    post op  . CAD (coronary artery disease)   . Carcinoma of lung ([name]) 08/28/2008   T2N0 moderately diff. squamous cell CA  . Cerebrovascular disease   . COPD (chronic obstructive pulmonary disease) ([place]) 2005   on CXR with R apical scaring  . Headache(784.0)    Hx: of migraines in the past  . Hypercholesterolemia    Hx: of  . Hypertension   . Incidental pulmonary nodule, > 85mm and < 68mm    LLL  . S/P aortic valve replacement 08/28/2008   owen  . S/P lobectomy of lung 08/28/2008   rt middle lobe  dr. Roxy Manns  . Shingles 11-09    Past Surgical History:  Procedure Laterality Date  . ABDOMINAL SURGERY    . AORTIC VALVE REPLACEMENT  08/28/2008   #8mm [facility] Ease pericardial tissue valve  . APPENDECTOMY    . CARDIAC CATHETERIZATION    . CATARACT EXTRACTION W/ INTRAOCULAR LENS  IMPLANT, BILATERAL Bilateral   . CHOLECYSTECTOMY N/A 12/13/2013   Procedure: LAPAROSCOPIC CHOLECYSTECTOMY;  Surgeon: Ralene Ok, MD;  Location: [place];  Service: General;  Laterality: N/A;  . COLONOSCOPY     \  . DILATION AND CURETTAGE OF UTERUS    . ERCP N/A 05/08/2015   Procedure: ENDOSCOPIC RETROGRADE CHOLANGIOPANCREATOGRAPHY (ERCP);  Surgeon: Inda Castle, MD;  Location: [place];  Service: Endoscopy;  Laterality: N/A;  . ERCP N/A 01/13/2016   Procedure: ENDOSCOPIC RETROGRADE CHOLANGIOPANCREATOGRAPHY (ERCP);  Surgeon: Doran Stabler, MD;  [facility];  Service: Endoscopy;  Laterality: N/A;  . ERCP N/A 03/09/2016   Procedure: ENDOSCOPIC RETROGRADE CHOLANGIOPANCREATOGRAPHY (ERCP);  Surgeon: Ladene Artist, MD;  Location[facility];  Service: Endoscopy;  Laterality: N/A;  . PACEMAKER IMPLANT N/A 12/05/2018   Procedure: PACEMAKER IMPLANT;  Surgeon: Evans Lance, MD;  Location: [place] CV LAB;  Service: Cardiovascular;  Laterality: N/A;  . RML removed Dr Ricard Dillon for lung cancer  08/28/2008   T2N0 squamous cell CA  . TONSILLECTOMY AND ADENOIDECTOMY    .  u/s guided aspiration of R breast cyst   2009   at the breast clinic     A IV Location/Drains/Wounds Patient Lines/Drains/Airways Status   Active Line/Drains/Airways    Name:   Placement date:   Placement time:   Site:   Days:   Peripheral IV 09/11/19 Right Antecubital   09/11/19    1558    Antecubital   less than 1   External Urinary Catheter   12/19/18    1100    -   266   Incision (Closed) 12/05/18 Chest Left;Upper   12/05/18    -     280          Intake/Output Last 24 hours No intake or output data in the 24 hours ending 09/11/19  1704  Labs/Imaging Results for orders placed or performed during the hospital encounter of 09/11/19 (from the past 48 hour(s))  SARS Coronavirus 2 by RT PCR (hospital order, performed in [facility] hospital lab) Nasopharyngeal Nasopharyngeal Swab     Status: None   Collection Time: 09/11/19  3:29 PM   Specimen: Nasopharyngeal Swab  Result Value Ref Range   SARS Coronavirus 2 NEGATIVE NEGATIVE    Comment: (NOTE) If result is NEGATIVE SARS-CoV-2 target nucleic acids are NOT DETECTED. The SARS-CoV-2 RNA is generally detectable in upper and lower  respiratory specimens during the acute phase of infection. The lowest  concentration of SARS-CoV-2 viral copies this assay can detect is 250  copies / mL. A negative result does not preclude SARS-CoV-2 infection  and should not be used as the sole basis for treatment or other  patient management decisions.  A negative result may occur with  improper specimen collection / handling, submission of specimen other  than nasopharyngeal swab, presence of viral mutation(s) within the  areas targeted by this assay, and inadequate number of viral copies  (<250 copies / mL). A negative result must be combined with clinical  observations, patient history, and epidemiological information. If result is POSITIVE SARS-CoV-2 target nucleic acids are DETECTED. The SARS-CoV-2 RNA is generally detectable in upper and lower  respiratory specimens dur ing the acute phase of infection.  Positive  results are indicative of active infection with SARS-CoV-2.  Clinical  correlation with patient history and other diagnostic information is  necessary to determine patient infection status.  Positive results do  not rule out bacterial infection or co-infection with other viruses. If result is PRESUMPTIVE POSTIVE SARS-CoV-2 nucleic acids MAY BE PRESENT.   A presumptive positive result was obtained on the submitted specimen  and confirmed on repeat testing.  While 2019  novel coronavirus  (SARS-CoV-2) nucleic acids may be present in the submitted sample  additional confirmatory testing may be necessary for epidemiological  and / or clinical management purposes  to differentiate between  SARS-CoV-2 and other Sarbecovirus currently known to infect humans.  If clinically indicated additional testing with an alternate test  methodology [id number]) is advised. The SARS-CoV-2 RNA is generally  detectable in upper and lower respiratory sp ecimens during the acute  phase of infection. The expected result is Negative. Fact Sheet for Patients:  StrictlyIdeas.no Fact Sheet for Healthcare Providers: BankingDealers.co.za This test is not yet approved or cleared by the [place] FDA and has been authorized for detection and/or diagnosis of SARS-CoV-2 by FDA under an Emergency Use Authorization (EUA).  This EUA will remain in effect (meaning this test can be used) for the duration of the COVID-19 declaration under Section 564(b)(1) of  the Act, 21 U.S.C. section 360bbb-3(b)(1), unless the authorization is terminated or revoked sooner. Performed at [facility], [place] [address], [place], [place] [address]   Basic metabolic panel     Status: Abnormal   Collection Time: 09/11/19  3:36 PM  Result Value Ref Range   Sodium 126 (L) 135 - 145 mmol/L   Potassium 3.9 3.5 - 5.1 mmol/L   Chloride 88 (L) 98 - 111 mmol/L   CO2 29 22 - 32 mmol/L   Glucose, Bld 131 (H) 70 - 99 mg/dL   BUN 7 (L) 8 - 23 mg/dL   Creatinine, Ser 0.90 0.44 - 1.00 mg/dL   Calcium 9.0 8.9 - 10.3 mg/dL   GFR calc non Af Amer 60 (L) >60 mL/min   GFR calc Af Amer >60 >60 mL/min   Anion gap 9 5 - 15    Comment: Performed at [place] [address], [place], [place] [address]  CBC with Differential     Status: None   Collection Time: 09/11/19  3:36 PM  Result Value Ref Range   WBC 7.4 4.0 - 10.5 K/uL   RBC 4.77 3.87 - 5.11 MIL/uL    Hemoglobin 14.1 12.0 - 15.0 g/dL   HCT 43.0 36.0 - 46.0 %   MCV 90.1 80.0 - 100.0 fL   MCH 29.6 26.0 - 34.0 pg   MCHC 32.8 30.0 - 36.0 g/dL   RDW 13.2 11.5 - 15.5 %   Platelets 252 150 - 400 K/uL   nRBC 0.0 0.0 - 0.2 %   Neutrophils Relative % 74 %   Neutro Abs 5.5 1.7 - 7.7 K/uL   Lymphocytes Relative 13 %   Lymphs Abs 0.9 0.7 - 4.0 K/uL   Monocytes Relative 11 %   Monocytes Absolute 0.8 0.1 - 1.0 K/uL   Eosinophils Relative 1 %   Eosinophils Absolute 0.1 0.0 - 0.5 K/uL   Basophils Relative 1 %   Basophils Absolute 0.0 0.0 - 0.1 K/uL   Immature Granulocytes 0 %   Abs Immature Granulocytes 0.03 0.00 - 0.07 K/uL    Comment: Performed at [facility], [address] [place], [place] [address]  Troponin I (High Sensitivity)     Status: Abnormal   Collection Time: 09/11/19  3:36 PM  Result Value Ref Range   Troponin I (High Sensitivity) 18 (H) <18 ng/L    Comment: (NOTE) Elevated high sensitivity troponin I (hsTnI) values and significant  changes across serial measurements may suggest ACS but many other  chronic and acute conditions are known to elevate hsTnI results.  Refer to the "Links" section for chest pain algorithms and additional  guidance. Performed at [facility], [place] [address] [place], [place] [address]    Ct Chest W Contrast  Result Date: 09/11/2019 CLINICAL DATA:  Increased prominence of the ascending thoracic aortic contour by chest x-ray with known aneurysmal disease of the ascending thoracic aorta and history of prior aortic valve replacement in 2009. Bilateral lower extremity pain. EXAM: CT CHEST WITH CONTRAST TECHNIQUE: Multidetector CT imaging of the chest was performed during intravenous contrast administration. CONTRAST:  36mL OMNIPAQUE IOHEXOL 300 MG/ML  SOLN COMPARISON:  CT of the chest without contrast on 12/17/2018 and CTA of the chest on 08/24/2016 FINDINGS: Cardiovascular: Aortic valve plane shows stable appearance of a prosthetic aortic  valve. Just above the valve plane, there is significant enlargement of the ascending thoracic aorta since prior studies which now  measures 7-7.1 cm in greatest diameter. There is an associated dissection with irregular intimal flap now extending in the lumen up to the level of the proximal arch. This approaches the origin of the innominate artery but does not visibly enter the origin. The dissection does not involve the rest of the arch or the descending thoracic aorta. No evidence of associated mediastinal or pleural hemorrhage. The proximal arch measures approximately 3.9 cm and the distal arch 3.3 cm. The mid descending thoracic aorta measures 2.8 cm. There is slight bulge in contour of the distal descending thoracic aorta which measures up to 3.7 cm in greatest diameter before tapering to 2.9 cm just above the diaphragmatic hiatus. The heart size is stable and within normal limits. Stable appearance of a dual-chamber pacemaker. No pericardial fluid identified. Stable calcified coronary artery plaque. Mediastinum/Nodes: No enlarged mediastinal, hilar, or axillary lymph nodes. Thyroid gland, trachea, and esophagus demonstrate no significant findings. Lungs/Pleura: Stable chronic lung disease with pulmonary scarring, right posterior pleural thickening, calcified pleural plaque and areas of pulmonary nodularity including a stable 7 mm posterior left lower lobe nodule. There is no evidence of pulmonary edema, consolidation, pneumothorax or pleural fluid. Upper Abdomen: Stable pneumobilia. Musculoskeletal: No chest wall abnormality. No acute or significant osseous findings. 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 The ascending thoracic aorta now measures 7-7.1 cm in greatest diameter. These results will be called to the ordering clinician or representative by the  Radiologist Assistant, and communication documented in the PACS or zVision Dashboard. Electronically Signed   By: Aletta Edouard M.D.   On: 09/11/2019 12:01    Pending Labs Unresulted Labs (From admission, onward)   None      Vitals/Pain Today's Vitals   09/11/19 1615 09/11/19 1630 09/11/19 1643 09/11/19 1645  BP: 124/65 123/65  133/72  Pulse: 61 60  62  Resp: 16 16  19   SpO2: 95% 97%  96%  Weight:      Height:      PainSc:   7      Isolation Precautions Airborne and Contact precautions  Medications Medications - No data to display  Mobility walks with device Moderate fall risk   Focused Assessments Cardiac Assessment Handoff:    Lab Results  Component Value Date   CKTOTAL 96 01/30/2015   CKMB 5.1 (H) 07/14/2011   TROPONINI 0.03 03/11/2016   Lab Results  Component Value Date   DDIMER 1.73 (H) 07/14/2011   Does the Patient currently have chest pain? Yes     R Recommendations: See Admitting Provider Note  Report given to:   Additional Notes: .

## 2019-09-11 NOTE — Plan of Care (Signed)
  Problem: Health Behavior/Discharge Planning: Goal: Ability to manage health-related needs will improve Outcome: Progressing   

## 2019-09-11 NOTE — ED Notes (Signed)
Cardiothoracic surgeon at bedside

## 2019-09-11 NOTE — Telephone Encounter (Signed)
Secure chat received from Dr. Stanford Breed advising triage nurse to contat him. Spoke with Dr. Stanford Breed who informed nurse that pt is to report to ED ASAP d/t 10/12 CT results and may need surgery.  Pt contacted and is aware that she is to report to ED ASAP. She states she wants to contact her daughter-in-law to pick her up to take her to [facility] ED. Advised pt that if she would like daughter-in-law to take her then she is to get in contact with her immediately. She states it should take her about 30 min to arrive at [facility]. Informed pt that nurse would call ahead at [facility] ED to make them aware she will presenting.  Contacted [facility] ED charge nurse Narda Rutherford to make her aware that pt to presenting to ED soon. She transferred nurse to another personnel to gather pt info. Informed personnel of pt name and other identifying info and reason for admission. She verbalized understanding

## 2019-09-11 NOTE — ED Notes (Signed)
Pt daughter inlaw at bedside

## 2019-09-11 NOTE — ED Provider Notes (Signed)
Pt signed out to me by Evette Cristal, PA-C.  Please see previous note for further history.  In brief, patient presented for evaluation of intermittent chest and abdominal pain.  Had an outpatient CT which showed a enlarging thoracic aortic dissection.  Recommend she come to the ER for evaluation.  HR and BP stable. Case was discussed with Dr. Orvan Seen from [place] surgery who requests admission to 2 Heart ICU.  Temporary admission orders placed.    Franchot Heidelberg, PA-C 09/11/19 1738    Hayden Rasmussen, MD 09/11/19 2311

## 2019-09-11 NOTE — Telephone Encounter (Signed)
° ° °  [facility] calling CT result

## 2019-09-11 NOTE — ED Triage Notes (Signed)
Patient states she was sent due to CTA showing a dissection. Patient c/o her heart feeling funny.

## 2019-09-11 NOTE — Telephone Encounter (Signed)
Pt had CTA today for surveillance of TAA; results were sent to my inbox; pt with 7-7.1 cm TAA with Type A dissection; pt contacted immediately and asked to present to [facility] ER; I spoke with Dr Johnney Killian in the ER to alert her to be aware of pt; I spoke with Dr Orvan Seen of [facility] who will also evaluate pt in ER.  Laurie [name]

## 2019-09-11 NOTE — Telephone Encounter (Signed)
Pt needs to be seen in ER immediately for type A dissection Laurie [name]

## 2019-09-12 ENCOUNTER — Encounter (HOSPITAL_COMMUNITY): Payer: Self-pay

## 2019-09-12 DIAGNOSIS — I7101 Dissection of thoracic aorta: Principal | ICD-10-CM

## 2019-09-12 LAB — CBC
HCT: 36.6 % (ref 36.0–46.0)
Hemoglobin: 12.9 g/dL (ref 12.0–15.0)
MCH: 30.9 pg (ref 26.0–34.0)
MCHC: 35.2 g/dL (ref 30.0–36.0)
MCV: 87.8 fL (ref 80.0–100.0)
Platelets: 209 10*3/uL (ref 150–400)
RBC: 4.17 MIL/uL (ref 3.87–5.11)
RDW: 13.3 % (ref 11.5–15.5)
WBC: 6.9 10*3/uL (ref 4.0–10.5)
nRBC: 0 % (ref 0.0–0.2)

## 2019-09-12 LAB — BASIC METABOLIC PANEL
Anion gap: 11 (ref 5–15)
BUN: 6 mg/dL — ABNORMAL LOW (ref 8–23)
CO2: 25 mmol/L (ref 22–32)
Calcium: 8.8 mg/dL — ABNORMAL LOW (ref 8.9–10.3)
Chloride: 89 mmol/L — ABNORMAL LOW (ref 98–111)
Creatinine, Ser: 0.83 mg/dL (ref 0.44–1.00)
GFR calc Af Amer: >60 mL/min (ref >60)
GFR calc non Af Amer: >60 mL/min (ref >60)
Glucose, Bld: 90 mg/dL (ref 70–99)
Potassium: 4.1 mmol/L (ref 3.5–5.1)
Sodium: 125 mmol/L — ABNORMAL LOW (ref 135–145)

## 2019-09-12 LAB — GLUCOSE, CAPILLARY
Glucose-Capillary: 93 mg/dL (ref 70–99)
Glucose-Capillary: 113 mg/dL — ABNORMAL HIGH (ref 70–99)
Glucose-Capillary: 131 mg/dL — ABNORMAL HIGH (ref 70–99)
Glucose-Capillary: 135 mg/dL — ABNORMAL HIGH (ref 70–99)
Glucose-Capillary: 128 mg/dL — ABNORMAL HIGH (ref 70–99)

## 2019-09-12 MED ORDER — AMIODARONE HCL 200 MG PO TABS
200.0000 mg | ORAL_TABLET | Freq: Every day | ORAL | Status: DC
  Administered 2019-09-12 – 2019-09-15 (×4): 200 mg via ORAL
  Filled 2019-09-12 (×4): qty 1

## 2019-09-12 MED ORDER — FUROSEMIDE 20 MG PO TABS: 20.0000 mg | ORAL_TABLET | ORAL | Status: DC | PRN

## 2019-09-12 MED ORDER — LISINOPRIL 5 MG PO TABS
5.0000 mg | ORAL_TABLET | Freq: Every day | ORAL | Status: DC
  Administered 2019-09-12 – 2019-09-15 (×3): 5 mg via ORAL
  Filled 2019-09-12 (×3): qty 1

## 2019-09-12 MED ORDER — SODIUM CHLORIDE 0.9 % IV BOLUS
250.0000 mL | Freq: Once | INTRAVENOUS | Status: AC
  Administered 2019-09-12: 250 mL via INTRAVENOUS

## 2019-09-12 MED ORDER — METOPROLOL TARTRATE 12.5 MG HALF TABLET: 12.5000 mg | ORAL_TABLET | Freq: Two times a day (BID) | ORAL | Status: DC

## 2019-09-12 MED ORDER — ALBUTEROL SULFATE (2.5 MG/3ML) 0.083% IN NEBU
2.5000 mg | INHALATION_SOLUTION | RESPIRATORY_TRACT | Status: DC | PRN
  Administered 2019-09-14: 12:00:00 2.5 mg via RESPIRATORY_TRACT
  Filled 2019-09-12: qty 3

## 2019-09-12 MED ORDER — ACETAMINOPHEN 500 MG PO TABS: 1000.0000 mg | ORAL_TABLET | Freq: Three times a day (TID) | ORAL | Status: DC | PRN

## 2019-09-12 MED ORDER — LABETALOL HCL 200 MG PO TABS
200.0000 mg | ORAL_TABLET | Freq: Two times a day (BID) | ORAL | Status: DC
  Administered 2019-09-12: 200 mg via ORAL
  Filled 2019-09-12: qty 1

## 2019-09-12 MED ORDER — ALBUMIN HUMAN 5 % IV SOLN
12.5000 g | Freq: Once | INTRAVENOUS | Status: AC
  Administered 2019-09-12: 12.5 g via INTRAVENOUS
  Filled 2019-09-12: qty 1250

## 2019-09-12 MED ORDER — HYDROCODONE-ACETAMINOPHEN 5-325 MG PO TABS
1.0000 | ORAL_TABLET | Freq: Three times a day (TID) | ORAL | Status: DC | PRN
  Administered 2019-09-13 – 2019-09-14 (×2): 1 via ORAL
  Filled 2019-09-12 (×2): qty 1

## 2019-09-12 MED ORDER — INFLUENZA VAC A&B SA ADJ QUAD 0.5 ML IM PRSY
0.5000 mL | PREFILLED_SYRINGE | INTRAMUSCULAR | Status: AC | PRN
  Administered 2019-09-15: 0.5 mL via INTRAMUSCULAR
  Filled 2019-09-12 (×2): qty 0.5

## 2019-09-12 MED ORDER — ALPRAZOLAM 0.5 MG PO TABS
0.5000 mg | ORAL_TABLET | Freq: Two times a day (BID) | ORAL | Status: DC | PRN
  Administered 2019-09-13 – 2019-09-14 (×3): 0.5 mg via ORAL
  Filled 2019-09-12 (×3): qty 1

## 2019-09-12 MED ORDER — LABETALOL HCL 5 MG/ML IV SOLN
10.0000 mg | INTRAVENOUS | Status: DC | PRN
  Administered 2019-09-12 – 2019-09-13 (×2): 10 mg via INTRAVENOUS
  Filled 2019-09-12 (×2): qty 4

## 2019-09-12 NOTE — H&P (Signed)
Subjective:   The patient is a 84 y.o. female who is admitted with several week history of abdominal pain and pain in her legs.  This prompted a CT scan for evaluation which was done earlier today.. Workup has revealed A sending aortic aneurysm with dissection.  Urgent consult patient is obtained from CT surgery in the emergency department for consideration of repair of dissection.  Patient Active Problem List   Diagnosis Date Noted  . Thoracic aortic dissection ([place]) 09/11/2019  . Aortic dissection ([place]) 09/11/2019  . Adrenal nodule ([place]) 08/29/2019  . Pacemaker 07/20/2019  . Constipation 07/20/2019  . Chronic RLQ pain 07/20/2019  . Chronic right hip pain 06/23/2019  . Rib fracture 12/19/2018  . Fall 12/17/2018  . Tachy-brady syndrome ([place]) 12/05/2018  . Low grade squamous intraepithelial lesion (LGSIL) on cervicovaginal cytologic smear 04/08/2018  . Paroxysmal atrial fibrillation ([place]) 02/25/2018  . Sinus bradycardia 02/25/2018  . Incidental pulmonary nodule, > 32mm and < 64mm   . Claudication ([place]) 04/22/2016  . Dyspnea   . Atrial fibrillation with RVR ([place])   . COPD (chronic obstructive pulmonary disease) ([place])   . RUQ abdominal pain   . Biliary calculus   . Pulmonary emphysema ([place])   . Aortic valve disease   . Thoracic aortic aneurysm without rupture ([place]) 08/19/2015  . Ascending aorta dilatation (HCC)   . Abdominal aortic aneurysm (AAA) 3.0 cm to 5.0 cm in diameter in female ([place]) 08/26/2011  . HYPERGLYCEMIA, MILD 11/10/2010  . OSTEOPENIA 09/29/2010  . Coronary atherosclerosis 05/15/2009  . Bilateral carotid artery disease ([place]) 05/15/2009  . TIA 05/15/2009  . ILIAC ARTERY ANEURYSM 05/15/2009  . GAD (generalized anxiety disorder) 03/20/2009  . THYROID NODULE 01/09/2009  . THYROMEGALY 01/08/2009  . SIALOLITHIASIS 01/08/2009  . Malignant neoplasm of middle lobe, bronchus or lung ([place]) 10/24/2008  . HLD (hyperlipidemia) 10/24/2008  . AORTIC VALVE REPLACEMENT, HX OF  10/24/2008  . S/P aortic valve replacement 08/28/2008  . DIVERTICULOSIS OF COLON 06/19/2008  . ANEMIA, IRON DEFICIENCY 06/06/2008  . Essential hypertension, benign 06/06/2008  . GASTROINTESTINAL HEMORRHAGE, HX OF 06/06/2008   Past Medical History:  Diagnosis Date  . AAA (abdominal aortic aneurysm) ([place])   . Anemia    iron deficiency  . Ascending aorta dilatation (HCC)   . Atrial fibrillation ([place])    post op  . CAD (coronary artery disease)   . Carcinoma of lung ([place]) 08/28/2008   T2N0 moderately diff. squamous cell CA  . Cerebrovascular disease   . COPD (chronic obstructive pulmonary disease) ([place]) 2005   on CXR with R apical scaring  . Headache(784.0)    Hx: of migraines in the past  . Hypercholesterolemia    Hx: of  . Hypertension   . Incidental pulmonary nodule, > 49mm and < 37mm    LLL  . S/P aortic valve replacement 08/28/2008   owen  . S/P lobectomy of lung 08/28/2008   rt middle lobe  dr. Roxy Manns  . Shingles 11-09    Past Surgical History:  Procedure Laterality Date  . ABDOMINAL SURGERY    . AORTIC VALVE REPLACEMENT  08/28/2008   #53mm [facility] Ease pericardial tissue valve  . APPENDECTOMY    . CARDIAC CATHETERIZATION    . CATARACT EXTRACTION W/ INTRAOCULAR LENS  IMPLANT, BILATERAL Bilateral   . CHOLECYSTECTOMY N/A 12/13/2013   Procedure: LAPAROSCOPIC CHOLECYSTECTOMY;  Surgeon: Ralene Ok, MD;  Location: [place];  Service: General;  Laterality: N/A;  . COLONOSCOPY     \  .  DILATION AND CURETTAGE OF UTERUS    . ERCP N/A 05/08/2015   Procedure: ENDOSCOPIC RETROGRADE CHOLANGIOPANCREATOGRAPHY (ERCP);  Surgeon: Inda Castle, MD;  Location: [place];  Service: Endoscopy;  Laterality: N/A;  . ERCP N/A 01/13/2016   Procedure: ENDOSCOPIC RETROGRADE CHOLANGIOPANCREATOGRAPHY (ERCP);  Surgeon: Doran Stabler, MD;  [facility];  Service: Endoscopy;  Laterality: N/A;  . ERCP N/A 03/09/2016   Procedure: ENDOSCOPIC RETROGRADE CHOLANGIOPANCREATOGRAPHY  (ERCP);  Surgeon: Ladene Artist, MD;  Location[facility];  Service: Endoscopy;  Laterality: N/A;  . PACEMAKER IMPLANT N/A 12/05/2018   Procedure: PACEMAKER IMPLANT;  Surgeon: Evans Lance, MD;  Location: [place] CV LAB;  Service: Cardiovascular;  Laterality: N/A;  . RML removed Dr Ricard Dillon for lung cancer  08/28/2008   T2N0 squamous cell CA  . TONSILLECTOMY AND ADENOIDECTOMY    . u/s guided aspiration of R breast cyst   2009   at the breast clinic    Medications Prior to Admission  Medication Sig Dispense Refill Last Dose  . acetaminophen (TYLENOL) 500 MG tablet Take 2 tablets (1,000 mg total) by mouth every 8 (eight) hours as needed (pain). 30 tablet 0 Past Month at Unknown time  . albuterol (PROVENTIL) (2.5 MG/3ML) 0.083% nebulizer solution Take 3 mLs (2.5 mg total) by nebulization every 4 (four) hours as needed for wheezing or shortness of breath. 30 vial 0 Past Week at Unknown time  . ALPRAZolam (XANAX) 0.5 MG tablet TAKE 1 TABLET (0.5 MG TOTAL) BY MOUTH 2 (TWO) TIMES DAILY AS NEEDED FOR ANXIETY. TAKE 0.5-1 TABLETS (0.25-5 MG TOTAL) BY MOUTH 2 (TWO) TIMES DAILY AS NEEDED. (Patient taking differently: Take 0.5-1 mg by mouth 2 (two) times daily as needed for anxiety. ) 60 tablet 0 Past Month at Unknown time  . amiodarone (PACERONE) 200 MG tablet Take 1 tablet (200 mg total) by mouth daily. 90 tablet 3 09/11/2019 at Unknown time  . lisinopril (ZESTRIL) 5 MG tablet Take 1 tablet (5 mg total) by mouth daily. 90 tablet 3 09/11/2019 at Unknown time  . XARELTO 20 MG TABS tablet TAKE 1 TABLET (20 MG TOTAL) BY MOUTH DAILY WITH SUPPER. (Patient taking differently: Take 20 mg by mouth daily with supper. High risk med: Anticoagulant.  Crushed Xarelto can be given down a G-tube but NOT a J-Tube.) 90 tablet 0 09/10/2019 at 1800  . AMBULATORY NON FORMULARY MEDICATION Nebulizer use as needed. Dx COPD J44.9 1 each 0   . furosemide (LASIX) 20 MG tablet Take 1 tablet (20 mg total) by mouth as needed for  edema (for leg swelling). (Patient not taking: Reported on 09/11/2019) 30 tablet 0 Not Taking at Unknown time  . HYDROcodone-acetaminophen (NORCO/VICODIN) 5-325 MG tablet Take 1 tablet by mouth 3 (three) times daily as needed for moderate pain. (Patient not taking: Reported on 09/11/2019) 15 tablet 0 Not Taking at Unknown time    Data Review:  CBC:  Lab Results  Component Value Date   WBC 7.4 09/11/2019   RBC 4.77 09/11/2019   BMP:  Lab Results  Component Value Date   GLUCOSE 131 (H) 09/11/2019   CO2 29 09/11/2019   BUN 7 (L) 09/11/2019   CREATININE 0.90 09/11/2019   CREATININE 0.87 08/11/2019   CALCIUM 9.0 09/11/2019   Coagulation:  Lab Results  Component Value Date   INR 1.18 03/09/2016   APTT 32 03/09/2016   Radiology review: I have reviewed the available imaging and agree with their interpretation  Physical Exam  Constitutional:  She is oriented to person, place, and time. No distress.  HENT:  Head: Normocephalic and atraumatic.  Eyes: Pupils are equal, round, and reactive to light.  Neck: Normal range of motion.  Cardiovascular: Normal rate and regular rhythm. Exam reveals no gallop.  No murmur heard. Pulmonary/Chest: Effort normal. No respiratory distress. She has wheezes. She has rales.  Abdominal: Soft. She exhibits no distension. There is no abdominal tenderness.  Musculoskeletal:        General: No edema.  Neurological: She is alert and oriented to person, place, and time. No cranial nerve deficit.  Skin: Skin is warm. She is not diaphoretic.  Psychiatric: She has a normal mood and affect.   Impression/plan: The case is discussed with Dr. Roxy Manns who has formally her operative surgeon and has known her for several years.  We are discussing the situation with the family and the pros and cons of urgent operative intervention.  The patient will be admitted to the ICU for close monitoring of pain and blood pressure.  All questions have been answered to her apparent  satisfaction.  Malyiah Fellows Z. Orvan Seen, [place]

## 2019-09-12 NOTE — Consult Note (Addendum)
Cardiology Consultation:   [name] ID: Laurie [name]; [id number]; 1935-02-22   Admit date: 09/11/2019 Date of Consult: 09/12/2019  Primary Care Provider: Hali Marry, MD Primary Cardiologist: Dr. Stanford Breed, Dr. Roxy Manns   [name] Profile:   Laurie [name] is a 84 y.o. female with a hx of atrial fibrillation (on amiodarone and Xarelto) status post pacemaker placement due to posttermination pauses, coronary artery disease (50% LAD), history of aortic stenosis status post aortic valve replacement in 2009, peripheral arterial disease,  hypertension, hyperlipidemia, ascending thoracic aortic aneurysm, right middle lobe lobectomy due to lung cancer who is being seen today for Laurie evaluation of blood pressure management for aortic dissection at Laurie request of Dr. Roxy Manns.  History of Present Illness:   Laurie [name] is a very pleasant 84 year old woman with medical history significant for atrial fibrillation (on amiodarone and Xarelto) status post pacemaker placement due to posttermination pauses, coronary artery disease (50% LAD), hypertension, hyperlipidemia, history of aortic stenosis status post aortic valve replacement in 2009, peripheral arterial disease, 4.7 cm ascending thoracic aortic aneurysm, 3.7 cm infrarenal abdominal aortic aneurysm, right middle lobe lobectomy due to lung cancer who presented to [facility] hospital after she was found to have a new type A aortic dissection with significantly enlarging ascending thoracic aorta measuring 7-7.1 cm on an elective CT scan of Laurie chest.  She reports that he has been experiencing wheezing and prompted her to be evaluated chest x-ray was obtained which revealed prominence of a sending thoracic aorta and radiology had recommended CT scan of Laurie chest with contrast which subsequently revealed her type A thoracic aortic aneurysm dissection.  While she was admitted, was administered labetalol (200 mg) and lisinopril (5 mg).  Following Laurie  administration of these meds, she was noted to have developed bradycardia with HR range 50-55 and hypotension with SBP of 50-70.  Labetalol was discontinued and was started on IV fluids.  Cardiology was asked to assist with management of blood pressure.  Per chart review, Laurie [name] and family has elected to proceed with any high risks surgical intervention and has elected for medical management.  Presently, she denies chest pain, shortness of breath, dizziness, lightheadedness but does endorse occasional nausea.  Past Medical History:  Diagnosis Date   AAA (abdominal aortic aneurysm) ([facility])    Anemia    iron deficiency   Ascending aorta dilatation (HCC)    Atrial fibrillation (HCC)    post op   CAD (coronary artery disease)    Carcinoma of lung ([place]) 08/28/2008   T2N0 moderately diff. squamous cell CA   Cerebrovascular disease    COPD (chronic obstructive pulmonary disease) ([place]) 2005   on CXR with R apical scaring   Headache(784.0)    Hx: of migraines in Laurie past   Hypercholesterolemia    Hx: of   Hypertension    Incidental pulmonary nodule, > 32mm and < 29mm    LLL   S/P aortic valve replacement 08/28/2008   [facility]   S/P lobectomy of lung 08/28/2008   rt middle lobe  dr. Roxy Manns   Shingles 11-09    Past Surgical History:  Procedure Laterality Date   ABDOMINAL SURGERY     AORTIC VALVE REPLACEMENT  08/28/2008   #78mm [facility] Ease pericardial tissue valve   APPENDECTOMY     CARDIAC CATHETERIZATION     CATARACT EXTRACTION W/ INTRAOCULAR LENS  IMPLANT, BILATERAL Bilateral    CHOLECYSTECTOMY N/A 12/13/2013   Procedure: LAPAROSCOPIC CHOLECYSTECTOMY;  Surgeon: Ralene Ok, MD;  Location: [facility] OR;  Service: General;  Laterality: N/A;   COLONOSCOPY     \   DILATION AND CURETTAGE OF UTERUS     ERCP N/A 05/08/2015   Procedure: ENDOSCOPIC RETROGRADE CHOLANGIOPANCREATOGRAPHY (ERCP);  Surgeon: Inda Castle, MD;  Location: [place] [facility];  Service: Endoscopy;   Laterality: N/A;   ERCP N/A 01/13/2016   Procedure: ENDOSCOPIC RETROGRADE CHOLANGIOPANCREATOGRAPHY (ERCP);  Surgeon: Doran Stabler, MD;  Location: [facility];  Service: Endoscopy;  Laterality: N/A;   ERCP N/A 03/09/2016   Procedure: ENDOSCOPIC RETROGRADE CHOLANGIOPANCREATOGRAPHY (ERCP);  Surgeon: Ladene Artist, MD;  Location: [facility];  Service: Endoscopy;  Laterality: N/A;   PACEMAKER IMPLANT N/A 12/05/2018   Procedure: PACEMAKER IMPLANT;  Surgeon: Evans Lance, MD;  Location: [facility];  Service: Cardiovascular;  Laterality: N/A;   RML removed Dr Ricard Dillon for lung cancer  08/28/2008   T2N0 squamous cell CA   TONSILLECTOMY AND ADENOIDECTOMY     u/s guided aspiration of R breast cyst   2009   [facility]     Home Medications:  Prior to Admission medications   Medication Sig Start Date End Date Taking? Authorizing Provider  acetaminophen (TYLENOL) 500 MG tablet Take 2 tablets (1,000 mg total) by mouth every 8 (eight) hours as needed (pain). 04/04/19  Yes Emeterio Reeve, DO  albuterol (PROVENTIL) (2.5 MG/3ML) 0.083% nebulizer solution Take 3 mLs (2.5 mg total) by nebulization every 4 (four) hours as needed for wheezing or shortness of breath. 12/01/18  Yes Phelps, Erin O, PA-C  ALPRAZolam (XANAX) 0.5 MG tablet TAKE 1 TABLET (0.5 MG TOTAL) BY MOUTH 2 (TWO) TIMES DAILY AS NEEDED FOR ANXIETY. TAKE 0.5-1 TABLETS (0.25-5 MG TOTAL) BY MOUTH 2 (TWO) TIMES DAILY AS NEEDED. [name] taking differently: Take 0.5-1 mg by mouth 2 (two) times daily as needed for anxiety.  08/25/19  Yes Hali Marry, MD  amiodarone (PACERONE) 200 MG tablet Take 1 tablet (200 mg total) by mouth daily. 01/27/19  Yes Lelon Perla, MD  lisinopril (ZESTRIL) 5 MG tablet Take 1 tablet (5 mg total) by mouth daily. 08/23/19  Yes Lelon Perla, MD  furosemide (LASIX) 20 MG tablet Take 1 tablet (20 mg total) by mouth as needed for edema (for leg swelling). [name] not taking: Reported on  09/11/2019 09/04/19 12/03/19  Lelon Perla, MD  HYDROcodone-acetaminophen (NORCO/VICODIN) 5-325 MG tablet Take 1 tablet by mouth 3 (three) times daily as needed for moderate pain. [name] not taking: Reported on 09/11/2019 09/08/19   Gregor Hams, MD    Inpatient Medications: Scheduled Meds:  amiodarone  200 mg Oral Daily   Chlorhexidine Gluconate Cloth  6 each Topical Daily   lisinopril  5 mg Oral Daily   sodium chloride flush  3 mL Intravenous Q12H   Continuous Infusions:  sodium chloride Stopped (09/12/19 1133)   PRN Meds: acetaminophen **OR** acetaminophen, albuterol, ALPRAZolam, furosemide, HYDROcodone-acetaminophen, influenza vaccine adjuvanted, labetalol, ondansetron **OR** ondansetron (ZOFRAN) IV, senna-docusate, traMADol  Allergies:    Allergies  Allergen Reactions   Hyoscyamine Other (See Comments)    Mental status changes.  (as of 02/13/16 [name] denies any problems with this medication)   Rofecoxib Anaphylaxis and Hives   Statins Other (See Comments)    Muscle aches   Metoprolol Other (See Comments)    Extreme fatigue, weakness and brady    Social History:   Social History   Socioeconomic History   Marital status: Single    Spouse name: Not  on file   Number of children: Not on file   Years of education: Not on file   Highest education level: Not on file  Occupational History   Not on file  Social Needs   Financial resource strain: Not on file   Food insecurity    Worry: Not on file    Inability: Not on file   Transportation needs    Medical: Not on file    Non-medical: Not on file  Tobacco Use   Smoking status: Former Smoker    Years: 40.00   Smokeless tobacco: Never Used  Substance and Sexual Activity   Alcohol use: No    Alcohol/week: 0.0 standard drinks   Drug use: No   Sexual activity: Not on file  Lifestyle   Physical activity    Days per week: Not on file    Minutes per session: Not on file   Stress: Not on file   Relationships   Social connections    Talks on phone: Not on file    Gets together: Not on file    Attends religious service: Not on file    Active member of club or organization: Not on file    Attends meetings of clubs or organizations: Not on file    Relationship status: Not on file   Intimate partner violence    Fear of current or ex partner: Not on file    Emotionally abused: Not on file    Physically abused: Not on file    Forced sexual activity: Not on file  Other Topics Concern   Not on file  Social History Narrative   Not on file    Family History:    Family History  Problem Relation Age of Onset   Stroke Father    Diabetes Son        brittle diabetes   Colon polyps Neg Hx    Colon cancer Neg Hx    Esophageal cancer Neg Hx    Stomach cancer Neg Hx    Pancreatic cancer Neg Hx    Kidney disease Neg Hx    Liver disease Neg Hx      ROS:  Please see Laurie history of present illness.  ROS  All other ROS reviewed and negative.     Physical Exam/Data:   Vitals:   09/12/19 1344 09/12/19 1347 09/12/19 1400 09/12/19 1406  BP: 97/72 (!) 70/54 (!) 79/55 (!) 82/43  Pulse: 60 68 (!) 59 (!) 59  Resp: (!) 23 20 14 18   Temp:      TempSrc:      SpO2: 94% 93% 95% 95%  Weight:      Height:        Intake/Output Summary (Last 24 hours) at 09/12/2019 1544 Last data filed at 09/12/2019 1100 Gross per 24 hour  Intake 200.71 ml  Output 600 ml  Net -399.29 ml   Filed Weights   09/11/19 1558 09/12/19 0500  Weight: 79.9 kg 77.1 kg   Body mass index is 27.43 kg/m.  General:  Well nourished, well developed, in no acute distress Neck: no JVD Endocrine:  No thryomegaly Vascular: 2+ brachial artery pulse, 2+ dorsalis pedis pulse Cardiac:  normal S1, S2; RRR; 3/6 systolic murmur at Laurie second right intercostal space Lungs:  Diffuse wheezing Abd: soft, nontender, no hepatomegaly  Ext: no edema Musculoskeletal:  No deformities, BUE and BLE strength normal and  equal Skin: warm and dry  Psych:  Normal affect   EKG:  Laurie EKG was personally reviewed and demonstrates: Normal sinus rhythm, LVH Telemetry:  Telemetry was personally reviewed and demonstrates: PVCs  Relevant CV Studies: 04/19/2019-echocardiogram  1. Laurie left ventricle appears hypertrophied with hyperdynamic systolic function, with an ejection fraction of >65%. Laurie cavity size was normal.  2. Laurie right ventricle has normal systolic function. Laurie cavity was normal. There is no increase in right ventricular wall thickness.  3. Laurie bioprosthetic AVR is poorly imaged, doppler parameters are normal.  Laboratory Data:  Chemistry Recent Labs  Lab 09/11/19 1536 09/12/19 0228  NA 126* 125*  K 3.9 4.1  CL 88* 89*  CO2 29 25  GLUCOSE 131* 90  BUN 7* 6*  CREATININE 0.90 0.83  CALCIUM 9.0 8.8*  GFRNONAA 60* >60  GFRAA >60 >60  ANIONGAP 9 11    No results for input(s): PROT, ALBUMIN, AST, ALT, ALKPHOS, BILITOT in Laurie last 168 hours. Hematology Recent Labs  Lab 09/11/19 1536 09/12/19 0228  WBC 7.4 6.9  RBC 4.77 4.17  HGB 14.1 12.9  HCT 43.0 36.6  MCV 90.1 87.8  MCH 29.6 30.9  MCHC 32.8 35.2  RDW 13.2 13.3  PLT 252 209   Cardiac EnzymesNo results for input(s): TROPONINI in Laurie last 168 hours. No results for input(s): TROPIPOC in Laurie last 168 hours.  BNPNo results for input(s): BNP, PROBNP in Laurie last 168 hours.  DDimer No results for input(s): DDIMER in Laurie last 168 hours.  Radiology/Studies:  Ct Chest W Contrast  Result Date: 09/11/2019 CLINICAL DATA:  Increased prominence of Laurie ascending thoracic aortic contour by chest x-ray with known aneurysmal disease of Laurie ascending thoracic aorta and history of prior aortic valve replacement in 2009. Bilateral lower extremity pain. EXAM: CT CHEST WITH CONTRAST TECHNIQUE: Multidetector CT imaging of Laurie chest was performed during intravenous contrast administration. CONTRAST:  58mL OMNIPAQUE IOHEXOL 300 MG/ML  SOLN COMPARISON:  CT of  Laurie chest without contrast on 12/17/2018 and CTA of Laurie chest on 08/24/2016 FINDINGS: Cardiovascular: Aortic valve plane shows stable appearance of a prosthetic aortic valve. Just above Laurie valve plane, there is significant enlargement of Laurie ascending thoracic aorta since prior studies which now measures 7-7.1 cm in greatest diameter. There is an associated dissection with irregular intimal flap now extending in Laurie lumen up to Laurie level of Laurie proximal arch. This approaches Laurie origin of Laurie innominate artery but does not visibly enter Laurie origin. Laurie dissection does not involve Laurie rest of Laurie arch or Laurie descending thoracic aorta. No evidence of associated mediastinal or pleural hemorrhage. Laurie proximal arch measures approximately 3.9 cm and Laurie distal arch 3.3 cm. Laurie mid descending thoracic aorta measures 2.8 cm. There is slight bulge in contour of Laurie distal descending thoracic aorta which measures up to 3.7 cm in greatest diameter before tapering to 2.9 cm just above Laurie diaphragmatic hiatus. Laurie heart size is stable and within normal limits. Stable appearance of a dual-chamber pacemaker. No pericardial fluid identified. Stable calcified coronary artery plaque. Mediastinum/Nodes: No enlarged mediastinal, hilar, or axillary lymph nodes. Thyroid gland, trachea, and esophagus demonstrate no significant findings. Lungs/Pleura: Stable chronic lung disease with pulmonary scarring, right posterior pleural thickening, calcified pleural plaque and areas of pulmonary nodularity including a stable 7 mm posterior left lower lobe nodule. There is no evidence of pulmonary edema, consolidation, pneumothorax or pleural fluid. Upper Abdomen: Stable pneumobilia. Musculoskeletal: No chest wall abnormality. No acute or significant osseous findings. IMPRESSION: Significant enlargement of Laurie ascending thoracic aorta since prior imaging  with new evidence of a type A dissection of Laurie ascending thoracic aorta extending to Laurie  proximal arch. Laurie ascending thoracic aorta now measures 7-7.1 cm in greatest diameter. These results will be called to Laurie ordering clinician or representative by Laurie Radiologist Assistant, and communication documented in Laurie PACS or zVision Dashboard. Electronically Signed   By: Aletta Edouard M.D.   On: 09/11/2019 12:01    Assessment and Plan:   #Type A dissecting thoracic aortic artery aneurysm 7-7.1 cm #History of AAA and thoracic aortic aneurysm This was found after an elective CT chest was performed as a follow-up to a checks x-ray when [name] began complaining of wheezing.  She was asked to urgently present to [facility] hospital for further management.  In review of her BP log, she had episodes of hypertension with maximum in Laurie 160s/80s.  She was started on p.o. labetalol (200 mg) and lisinopril (5 mg).  An hour after administration of these meds, she was found to be unresponsive in Laurie bathroom though responsive to verbal commands.  Her vitals reviewed bradycardia with HR in Laurie 50s and hypotension with SBP in Laurie 50s-70s.  IV fluid resuscitation was initiated.  CVTS had an extensive discussion with Laurie [name] and her family and they have elected not to pursue any high risks surgical intervention.  Currently, her BP is 107/54 with pulse of 60. She report of feeling weak when she was on metoprolol about a year ago.  Knowing that she has been intolerant to metoprolol and labetalol, I believe we should stay away from beta blockers. -Continue lisinopril 5 mg daily for now.  If SBP >125-130, will add amlodipine 2.5 mg  #Hypertension: Maintain SBP between 100-140 -Continue lisinopril 5 mg daily  #Atrial fibrillation She is status post pacemaker placement due to posttermination pauses. Remains in SR.  -Continue amiodarone   For questions or updates, please contact [place] Please consult [email or web address] for contact info under Cardiology/STEMI.   Signed, Jean Rosenthal, MD  09/12/2019 3:44  PM   [name] seen, examined. Available data reviewed. Agree with findings, assessment, and plan as outlined by Nathanial Rancher, MD. on my examination, Laurie [name] is alert and oriented, elderly woman in no distress.  JVP is normal, HEENT is normal, carotid upstrokes are normal with bilateral bruits.  Lungs have scattered rhonchi and wheezing bilaterally.  Laurie heart is regular rate and rhythm with a 3/6 early peaking systolic murmur best heard at Laurie right upper sternal border.  There is no diastolic murmur.  Abdomen is soft and nontender with positive bowel sounds.  Extremities have no edema.  Skin is warm and dry with no rash.  All available data is reviewed.  Laurie [name] is admitted after a CT scan demonstrated a 7 cm ascending aortic aneurysm associated with an intimal flap consistent with an aortic dissection.  Laurie [name] appears to be completely asymptomatic and Laurie exact timing of her dissection is unclear.  She has undergone formal evaluation by cardiac surgery with plans for conservative management considering her age and multiple comorbid medical conditions.  Today she had a marked hypotensive event after receiving oral labetalol.  While she would ideally be on a beta-blocker, she has been intolerant to metoprolol as an outpatient.  I think now that she has been intolerant to two different beta-blockers, it is best to avoid this class.  Would continue lisinopril 5 mg daily which she has tolerated fairly well.  She states her blood pressure at home is almost  always less than 130 mmHg.  Her blood pressure is currently 99/48 after receiving fluids.  If her blood pressure is staying consistently greater than 120/80 mmHg, it would be reasonable to consider adding amlodipine 2.5 mg daily.  For now would continue lisinopril alone.  Will write hold parameters for lisinopril in Laurie case that her blood pressure remains less than 974 mmHg systolic.  Sherren Mocha, M.D. 09/12/2019 4:41 PM

## 2019-09-12 NOTE — Plan of Care (Signed)
Pt found in the bathroom on the toilet alert but unresponsive by the writer of this note after being assisted to the bathroom by the CHG RN. Pt was not following commands, clammy, and pale. Pt was also unable to give a baseline verbal response, speech appeared to be slurred. Help arrived and the pt was removed from the bathroom and placed back into bed. VS obtained, BP low, CBG 130's, HR 50-60's. Once back in bed, MD was notified and pt had x1 episode of vomiting. No evidence of aspiration at the time. Assisted pt with mouth care/suctioning and changing the sheets. MD called back and came up to check on pt. Sys BP in the 50-70's, MD Roxy Manns gave VO for 250 fluid bolus to help BP. Pt still alert but now talking. States that "I'm feeling lightheaded still." Fluids started, HOB elevated. Will continue to monitor and assess.

## 2019-09-12 NOTE — Progress Notes (Addendum)
[address]       [place] [address]             [phone]        CARDIOTHORACIC SURGERY PROGRESS NOTE  Subjective: No new complaints.  Right hip pain and bilateral lower leg discomfort overnight  Objective: Vital signs: BP Readings from Last 1 Encounters:  09/12/19 (!) 142/62   Pulse Readings from Last 1 Encounters:  09/12/19 60   Resp Readings from Last 1 Encounters:  09/12/19 15   Temp Readings from Last 1 Encounters:  09/12/19 97.8 F (36.6 C) (Oral)    Hemodynamics:    Physical Exam:  Rhythm:   sinus  Breath sounds: clear  Heart sounds:  RRR  Incisions:  n/a  Abdomen:  soft  Extremities:  Warm, palpable femoral pulses   Intake/Output from previous day: 10/12 0701 - 10/13 0700 In: 152.7 [I.V.:152.7] Out: 600 [Urine:600] Intake/Output this shift: Total I/O In: 12.4 [I.V.:12.4] Out: 0   Lab Results:  CBC: Recent Labs    09/11/19 1536 09/12/19 0228  WBC 7.4 6.9  HGB 14.1 12.9  HCT 43.0 36.6  PLT 252 209    BMET:  Recent Labs    09/11/19 1536 09/12/19 0228  NA 126* 125*  K 3.9 4.1  CL 88* 89*  CO2 29 25  GLUCOSE 131* 90  BUN 7* 6*  CREATININE 0.90 0.83  CALCIUM 9.0 8.8*     PT/INR:  No results for input(s): LABPROT, INR in the last 72 hours.  CBG (last 3)  Recent Labs    09/11/19 2135 09/12/19 0713  GLUCAP 134* 93    ABG    Component Value Date/Time   PHART 7.310 (L) 08/28/2008 1811   PCO2ART 46.9 (H) 08/28/2008 1811   PO2ART 117.0 (H) 08/28/2008 1811   HCO3 23.4 08/28/2008 1811   TCO2 24 08/29/2008 1700   ACIDBASEDEF 3.0 (H) 08/28/2008 1811   O2SAT 98.0 08/28/2008 1811    CXR: n/a  Assessment/Plan:   Patient has remained completely stable overnight in sinus rhythm.  Blood pressure is mildly elevated.  At present she denies any symptoms which might be related to her aortic dissection.  Her biggest complaint overnight was related to her chronic right hip pain from degenerative arthritis.   I had an extensive conversation with the patient, her daughter, and her son at the bedside this morning.  We discussed the nature of her aortic dissection at length including the fact that at this point we have no idea when it may have initially developed and that it is probably unrelated to her chronic pain in both lower legs.  She has reported some intermittent vague discomfort across her chest and her upper abdomen which may be related to the dissection.  The patient and her children all understand the fact that her dissection may not remained stable and she will be at significant risk for propagation of the dissection and/or rupture of her aorta which could be associated with sudden death.  They understand that an attempt at surgical repair would be associated with extremely high risk and a significant likelihood that she might never recover to her previous baseline functional status.  The patient has no interest in considering high risk surgical intervention as an option.  We plan to treat her medically with blood pressure control.  We have specifically discussed CODE STATUS and the patient understands that she would not be resuscitated were she to develop sudden cardiac  or respiratory arrest.  We will resume the patient's preoperative dose of lisinopril and add oral labetalol.  Xarelto will not be resumed.  We will let the patient start getting up and ambulating and tentatively plan for DC home tomorrow or Thursday depending on whether her not her blood pressure is under control.  Patient understands that she should no longer drive an automobile.  The patient's family has expressed desire to meet with members from the palliative care team to help anticipate the possibility of need for comfort measures in the future should she suffer a sudden change in her clinical condition.  All questions answered.  Discussed with Dr. Stanford Breed who concurs with plan.   I spent in excess of 45 minutes during the conduct  of this hospital encounter and >50% of this time involved direct face-to-face encounter with the patient for counseling and/or coordination of their care.     Rexene Alberts, MD 09/12/2019 10:29 AM

## 2019-09-12 NOTE — Progress Notes (Signed)
EVENING ROUNDS NOTE :     [place]       [place] [address]             [phone]                      Total Length of Stay:  LOS: 1 day  Events:  Comfortable in bed  Eating dinner.     BP (!) 82/43   Pulse (!) 59   Temp 98 F (36.7 C) (Oral)   Resp 18   Ht 5\' 6"  (1.676 m)   Wt 77.1 kg   SpO2 95%   BMI 27.43 kg/m         . sodium chloride Stopped (09/12/19 1133)    I/O last 3 completed shifts: In: 152.7 [I.V.:152.7] Out: 600 [Urine:600]   CBC Latest Ref Rng & Units 09/12/2019 09/11/2019 08/11/2019  WBC 4.0 - 10.5 K/uL 6.9 7.4 7.8  Hemoglobin 12.0 - 15.0 g/dL 12.9 14.1 15.3  Hematocrit 36.0 - 46.0 % 36.6 43.0 45.3(H)  Platelets 150 - 400 K/uL 209 252 244    BMP Latest Ref Rng & Units 09/12/2019 09/11/2019 08/11/2019  Glucose 70 - 99 mg/dL 90 131(H) 114(H)  BUN 8 - 23 mg/dL 6(L) 7(L) 9  Creatinine 0.44 - 1.00 mg/dL 0.83 0.90 0.87  BUN/Creat Ratio 6 - 22 (calc) - - NOT APPLICABLE  Sodium 637 - 145 mmol/L 125(L) 126(L) 129(L)  Potassium 3.5 - 5.1 mmol/L 4.1 3.9 4.4  Chloride 98 - 111 mmol/L 89(L) 88(L) 90(L)  CO2 22 - 32 mmol/L 25 29 27   Calcium 8.9 - 10.3 mg/dL 8.8(L) 9.0 9.6    ABG    Component Value Date/Time   PHART 7.310 (L) 08/28/2008 1811   PCO2ART 46.9 (H) 08/28/2008 1811   PO2ART 117.0 (H) 08/28/2008 1811   HCO3 23.4 08/28/2008 1811   TCO2 24 08/29/2008 1700   ACIDBASEDEF 3.0 (H) 08/28/2008 1811   O2SAT 98.0 08/28/2008 1811       Melodie Bouillon, MD 09/12/2019 4:41 PM

## 2019-09-12 NOTE — Discharge Summary (Signed)
Physician Discharge Summary  Patient ID: Laurie [name] MRN: [id number] DOB/AGE: 1935-09-15 84 y.o.  Admit date: 09/11/2019 Discharge date: 09/15/2019  Admission Diagnoses: Type A aortic dissection  Discharge Diagnoses:  Principal Problem:   Dissecting aneurysm of ascending aorta ([place]) Active Problems:   Aortic dissection [facility])   Patient Active Problem List   Diagnosis Date Noted  . Dissecting aneurysm of ascending aorta ([place]) 09/11/2019  . Aortic dissection ([place]) 09/11/2019  . Adrenal nodule ([place]) 08/29/2019  . Pacemaker 07/20/2019  . Constipation 07/20/2019  . Chronic RLQ pain 07/20/2019  . Chronic right hip pain 06/23/2019  . Rib fracture 12/19/2018  . Fall 12/17/2018  . Tachy-brady syndrome ([place]) 12/05/2018  . Low grade squamous intraepithelial lesion (LGSIL) on cervicovaginal cytologic smear 04/08/2018  . Paroxysmal atrial fibrillation ([place]) 02/25/2018  . Sinus bradycardia 02/25/2018  . Incidental pulmonary nodule, > 40mm and < 17mm   . Claudication ([place]) 04/22/2016  . Dyspnea   . Atrial fibrillation with RVR ([place])   . COPD (chronic obstructive pulmonary disease) ([place])   . RUQ abdominal pain   . Biliary calculus   . Pulmonary emphysema ([place])   . Aortic valve disease   . Thoracic aortic aneurysm without rupture ([place]) 08/19/2015  . Ascending aorta dilatation ([facility])   . Abdominal aortic aneurysm (AAA) 3.0 cm to 5.0 cm in diameter in female ([place]) 08/26/2011  . HYPERGLYCEMIA, MILD 11/10/2010  . OSTEOPENIA 09/29/2010  . Coronary atherosclerosis 05/15/2009  . Bilateral carotid artery disease ([place]) 05/15/2009  . TIA 05/15/2009  . ILIAC ARTERY ANEURYSM 05/15/2009  . GAD (generalized anxiety disorder) 03/20/2009  . THYROID NODULE 01/09/2009  . THYROMEGALY 01/08/2009  . SIALOLITHIASIS 01/08/2009  . Malignant neoplasm of middle lobe, bronchus or lung ([place]) 10/24/2008  . HLD (hyperlipidemia) 10/24/2008  . AORTIC VALVE REPLACEMENT, HX OF 10/24/2008  . S/P aortic valve  replacement 08/28/2008  . DIVERTICULOSIS OF COLON 06/19/2008  . ANEMIA, IRON DEFICIENCY 06/06/2008  . Essential hypertension, benign 06/06/2008  . GASTROINTESTINAL HEMORRHAGE, HX OF 06/06/2008   HPI:  Patient is an 84 year old female who is well-known to me with history of multiple medical problems who has been referred for second opinion regarding possible management options for newly discovered type A aortic dissection.  Patient originally presented with severe symptomatic aortic stenosis and was coincidentally found to have a lung mass at the time.  She underwent aortic valve replacement using a bioprosthetic tissue valve, septal myomectomy, and right middle lobectomy on August 28, 2008.  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  She did well from a cardiac standpoint and has been followed regularly ever since for long-term cancer surveillance.  She was last seen in our office on Apr 18, 2018 at which time she remained clinically stable and follow-up CT scan revealed stable benign-appearing nodule in the left lung and no other signs of locally recurrent or distant metastatic disease.  Patient has been noted to have stable mild fusiform aneurysmal enlargement of the ascending thoracic aorta with maximum transverse diameter measured 4.7 cm at that time.  She also had stable 3.7 cm infrarenal abdominal aortic aneurysm.  Clinically the patient's physical mobility was notably diminished and she continued to complain of significant chronic back pain, which has been slowly getting worse for several years.  Comorbid medical problems include PAD with significant LE arterial insufficiency, recurrent PAF on long-term anticoagulation using Xarelto, SSS and post-termination pauses s/p permanent pacemaker placement,  and long-standing hypertension.  Most recent echocardiogram performed 04/19/2019 revealed normal  LV systolic function and normal functioning bioprosthetic tissue valve in the aortic position.   Patient complains that over the last 2 or 3 weeks she has had significant pain and weakness in both lower legs.  She denies any chest pain or shortness of breath.  She has chronic back pain.  She reports this is no different.  She has had some pain in her right side and reportedly suffered a fall recently.  She was seen in the office recently at her primary care physician's office on 08/29/2019 and more recently at [facility] by Coletta Memos on 09/06/2019.  She underwent elective outpatient CT scan of the chest earlier today which revealed a new type A aortic dissection and severe enlargement of the ascending thoracic aorta with maximum transverse diameter approximately 7 cm.  Patient was sent directly to the emergency department and has been evaluated previously by Dr. Orvan Seen.  Per the patient's request I was asked to come by for a second opinion.  The patient is widowed and lives alone locally in [place].  She has limited mobility but has still remained functionally independent.  She gets tired easily.  She has significant chronic back pain.  At present she denies any pain in her chest or back.  She denies SOB.  She still has some discomfort in her legs but this has eased.   Discharged Condition: stable  Hospital Course: The patient was admitted for further evaluation treatment to include blood pressure control.  Labetalol was added to her medical regimen and she was continued on her lisinopril.  Upon initiation of labetalol she had significant drop in blood pressure and this has subsequently been stopped.  Cardiology consultation was obtained who assisted with medication management and adjustment to stabilize her blood pressure.  Her Xarelto will not be resumed.  She is not an acceptable candidate for surgery.  The patient's CODE STATUS was discussed with her and she does understand that she is not a  candidate for any type of resuscitation should she have cardiac arrest . Her BP is under adequate control under current medications.  She did have some difficulty with constipation but this has improved following routine measures as well as an enema.  At this time she is felt to be stable for discharge. Palliative care follow-up has also been arranged.  Consults: cardiology  Significant Diagnostic Studies: Chest CTA  Treatments: cardiac meds: see below  Discharge Exam: Blood pressure (!) 100/53, pulse 61, temperature (!) 97.5 F (36.4 C), temperature source Oral, resp. rate 19, height 5\' 6"  (1.676 m), weight 73.6 kg, SpO2 94 %.  General appearance: alert, cooperative and no distress Heart: regular rate and rhythm Lungs: + ronchi Abdomen: mild RUQ tenderness, soft, non-distended, + BS Extremities: no edema  Disposition: Discharge disposition: 01-Home or Self Care       Discharge Instructions    Discharge patient   Complete by: As directed    Discharge disposition: 01-Home or Self Care   Discharge patient date: 09/15/2019     Allergies as of 09/15/2019      Reactions   Hyoscyamine Other (See Comments)   Mental status changes.  (as of 02/13/16 patient denies any problems with this medication)   Rofecoxib Anaphylaxis, Hives   Statins Other (See Comments)   Muscle aches   Metoprolol Other (See Comments)   Extreme fatigue, weakness and brady      Medication List  STOP taking these medications   furosemide 20 MG tablet Commonly known as: LASIX   HYDROcodone-acetaminophen 5-325 MG tablet Commonly known as: NORCO/VICODIN     TAKE these medications   acetaminophen 500 MG tablet Commonly known as: TYLENOL Take 2 tablets (1,000 mg total) by mouth every 8 (eight) hours as needed (pain).   albuterol (2.5 MG/3ML) 0.083% nebulizer solution Commonly known as: PROVENTIL Take 3 mLs (2.5 mg total) by nebulization every 4 (four) hours as needed for wheezing or shortness of  breath.   ALPRAZolam 0.5 MG tablet Commonly known as: XANAX TAKE 1 TABLET (0.5 MG TOTAL) BY MOUTH 2 (TWO) TIMES DAILY AS NEEDED FOR ANXIETY. TAKE 0.5-1 TABLETS (0.25-5 MG TOTAL) BY MOUTH 2 (TWO) TIMES DAILY AS NEEDED. What changed:   how much to take  additional instructions   amiodarone 200 MG tablet Commonly known as: PACERONE Take 1 tablet (200 mg total) by mouth daily.   carvedilol 25 MG tablet Commonly known as: COREG Take 1 tablet (25 mg total) by mouth 2 (two) times daily with a meal.   hydrALAZINE 10 MG tablet Commonly known as: APRESOLINE Take 1 tablet (10 mg total) by mouth every 4 (four) hours as needed (SBP > 140 mmHg).   lisinopril 5 MG tablet Commonly known as: ZESTRIL Take 1 tablet (5 mg total) by mouth daily.      Follow-up Information    Rexene Alberts, MD Follow up.   Specialty: Cardiothoracic Surgery Why: See discharge paperwork for an appointment in 3 months to have a CT scan also see Dr. Leota Sauers information: [place] [address] [phone]        Erlene Quan, PA-C Follow up on 09/25/2019.   Specialties: Cardiology, Radiology Why: virtual visit at @ 8:30am for hospital follow up  Contact information: [place] [address] [phone]        Lelon Perla, MD. Go on 11/16/2019.   Specialty: Cardiology Why: @2pm  for follow up Contact information: [place] [address] [phone]        [place], [place] [facility] Follow up.   Why: referral made for outpt PC with [facility] program Contact information: [address] [place] [address] [phone]           Signed: John Giovanni PA-C 09/15/2019, 1:47 PM

## 2019-09-12 NOTE — Progress Notes (Signed)
TCTS BRIEF SICU PROGRESS NOTE  Within 60 minutes after receiving 1st dose of labetalol (200 mg) and lisinopril (5 mg) patient developed sinus bradycardia and hypotension w/ HR 50-55 and SBP 50-70  Plan: Currently patient is awake and alert, feels weak.  She denies any significant pain in chest.  Startng IV fluids bolus.  Will stop labetalol and ask for Cardiology team to see to assist w/ BP meds and interrogate pacemaker to check backup settings.  Monitor closely.  Rexene Alberts, MD 09/12/2019 1:03 PM

## 2019-09-13 ENCOUNTER — Other Ambulatory Visit: Payer: Self-pay | Admitting: Cardiology

## 2019-09-13 DIAGNOSIS — I495 Sick sinus syndrome: Secondary | ICD-10-CM

## 2019-09-13 DIAGNOSIS — I480 Paroxysmal atrial fibrillation: Secondary | ICD-10-CM

## 2019-09-13 LAB — GLUCOSE, CAPILLARY
Glucose-Capillary: 108 mg/dL — ABNORMAL HIGH (ref 70–99)
Glucose-Capillary: 97 mg/dL (ref 70–99)
Glucose-Capillary: 102 mg/dL — ABNORMAL HIGH (ref 70–99)
Glucose-Capillary: 101 mg/dL — ABNORMAL HIGH (ref 70–99)

## 2019-09-13 LAB — BASIC METABOLIC PANEL
Anion gap: 10 (ref 5–15)
BUN: 8 mg/dL (ref 8–23)
CO2: 25 mmol/L (ref 22–32)
Calcium: 8.9 mg/dL (ref 8.9–10.3)
Chloride: 91 mmol/L — ABNORMAL LOW (ref 98–111)
Creatinine, Ser: 0.82 mg/dL (ref 0.44–1.00)
GFR calc Af Amer: >60 mL/min (ref >60)
GFR calc non Af Amer: >60 mL/min (ref >60)
Glucose, Bld: 132 mg/dL — ABNORMAL HIGH (ref 70–99)
Potassium: 4 mmol/L (ref 3.5–5.1)
Sodium: 126 mmol/L — ABNORMAL LOW (ref 135–145)

## 2019-09-13 LAB — CBC
HCT: 37.3 % (ref 36.0–46.0)
Hemoglobin: 13 g/dL (ref 12.0–15.0)
MCH: 30.8 pg (ref 26.0–34.0)
MCHC: 34.9 g/dL (ref 30.0–36.0)
MCV: 88.4 fL (ref 80.0–100.0)
Platelets: 186 10*3/uL (ref 150–400)
RBC: 4.22 MIL/uL (ref 3.87–5.11)
RDW: 13.3 % (ref 11.5–15.5)
WBC: 7.1 10*3/uL (ref 4.0–10.5)
nRBC: 0 % (ref 0.0–0.2)

## 2019-09-13 MED ORDER — PANTOPRAZOLE SODIUM 40 MG PO TBEC
40.0000 mg | DELAYED_RELEASE_TABLET | Freq: Every day | ORAL | Status: DC
  Administered 2019-09-13 – 2019-09-15 (×3): 40 mg via ORAL
  Filled 2019-09-13 (×3): qty 1

## 2019-09-13 MED ORDER — HYDRALAZINE HCL 10 MG PO TABS
10.0000 mg | ORAL_TABLET | Freq: Four times a day (QID) | ORAL | Status: DC | PRN
  Administered 2019-09-13: 10 mg via ORAL
  Filled 2019-09-13: qty 1

## 2019-09-13 MED ORDER — HYDRALAZINE HCL 20 MG/ML IJ SOLN
25.0000 mg | Freq: Once | INTRAMUSCULAR | Status: AC
  Administered 2019-09-13: 02:00:00 25 mg via INTRAVENOUS
  Filled 2019-09-13: qty 2

## 2019-09-13 MED ORDER — MAGNESIUM HYDROXIDE 400 MG/5ML PO SUSP: 30.0000 mL | Freq: Every day | ORAL | Status: DC | PRN

## 2019-09-13 NOTE — Progress Notes (Signed)
Pt with CP to abdomen and back today according to RN. Discussed with RN, he can get her up in room if warranted but we will not ambulate right now.  Yves Dill CES, ACSM 10:38 AM 09/13/2019

## 2019-09-13 NOTE — Progress Notes (Signed)
Palliative Note:  Referral received for goals of care discussion. I received a call from Percell Miller, [place] stating family was at the bedside expecting to meet with our team. Unfortunately we are not available to meet at the current time, given volume of referrals and available providers.   I was able to speak with patient's son, [name]. I introduced myself and palliative medicine. Son verbalized understanding and appreciation of our involvement. He is aware will be unable to meet today. [name] states family would like to meet tomorrow morning, Thur 09/14/19 @ 10 am for [place] discussion.   Confirmed date and time. Family aware I will meet them at the bedside. Detailed notes and recommendations to follow family meeting.   Thank you for allowing Palliative to assist with patient's care.   Alda Lea, AGPCNP-BC Palliative Medicine Team  Phone: [phone] Pager: [phone] Amion: N. [place]   NO CHARGE

## 2019-09-13 NOTE — Progress Notes (Addendum)
TCTS DAILY ICU PROGRESS NOTE                   [address]            [place] [address]          [phone]       Total Length of Stay:  LOS: 2 days   Subjective: BP has stabilized 90's to low 829'H systolic, 37'J diastolic Some burning type chest and abdominal pain, some back discomfort she relates to the bed.  Some feet numbness and tingling- not an acute c/o C/o constipation, chronic issue  Objective: Vital signs in last 24 hours: Temp:  [97.6 F (36.4 C)-98.2 F (36.8 C)] 97.6 F (36.4 C) (10/14 0400) Pulse Rate:  [35-68] 61 (10/14 0700) Cardiac Rhythm: Normal sinus rhythm (10/14 0430) Resp:  [10-23] 19 (10/14 0700) BP: (58-170)/(41-106) 90/77 (10/14 0700) SpO2:  [91 %-96 %] 95 % (10/14 0700) Weight:  [75.9 kg] 75.9 kg (10/14 0448)  Filed Weights   09/11/19 1558 09/12/19 0500 09/13/19 0448  Weight: 79.9 kg 77.1 kg 75.9 kg    Weight change: -4.036 kg   Hemodynamic parameters for last 24 hours:    Intake/Output from previous day: 10/13 0701 - 10/14 0700 In: 48.1 [I.V.:48.1] Out: 1100 [Urine:1100]  Intake/Output this shift: No intake/output data recorded.  Current Meds: Scheduled Meds: . amiodarone  200 mg Oral Daily  . Chlorhexidine Gluconate Cloth  6 each Topical Daily  . lisinopril  5 mg Oral Daily  . sodium chloride flush  3 mL Intravenous Q12H   Continuous Infusions: . sodium chloride Stopped (09/12/19 1133)   PRN Meds:.acetaminophen **OR** acetaminophen, albuterol, ALPRAZolam, furosemide, HYDROcodone-acetaminophen, influenza vaccine adjuvanted, labetalol, ondansetron **OR** ondansetron (ZOFRAN) IV, senna-docusate, traMADol  General appearance: alert, cooperative and no distress Heart: regular rate and rhythm and 2/6 holosystolic murmur Lungs: + ronchi throughout Abdomen: soft, mild distension, nontender Extremities: no edema, pedal pulses absent on left, + DP on right, warm well perfused  Lab Results: CBC: Recent Labs   09/11/19 1536 09/12/19 0228  WBC 7.4 6.9  HGB 14.1 12.9  HCT 43.0 36.6  PLT 252 209   BMET:  Recent Labs    09/11/19 1536 09/12/19 0228  NA 126* 125*  K 3.9 4.1  CL 88* 89*  CO2 29 25  GLUCOSE 131* 90  BUN 7* 6*  CREATININE 0.90 0.83  CALCIUM 9.0 8.8*    CMET: Lab Results  Component Value Date   WBC 6.9 09/12/2019   HGB 12.9 09/12/2019   HCT 36.6 09/12/2019   PLT 209 09/12/2019   GLUCOSE 90 09/12/2019   CHOL 291 (H) 09/18/2010   TRIG 308 (H) 09/18/2010   HDL 38 (L) 09/18/2010   LDLCALC 191 (H) 09/18/2010   ALT 20 08/11/2019   AST 20 08/11/2019   NA 125 (L) 09/12/2019   K 4.1 09/12/2019   CL 89 (L) 09/12/2019   CREATININE 0.83 09/12/2019   BUN 6 (L) 09/12/2019   CO2 25 09/12/2019   TSH 2.86 04/04/2019   INR 1.18 03/09/2016   HGBA1C 6.2 (H) 04/18/2015      PT/INR: No results for input(s): LABPROT, INR in the last 72 hours. Radiology: No results found.   Assessment/Plan:  1 stabilized BP after event, probably low to give lisinopril for now Will change parameters to hold for less than 696 systolic 2 sinus rhythm with some Apacing/Vpacing, amio 200 mg daily started yesterday 3 will hold lasix with low  BP , also hyponatremic 4 pulm toilet- add IS, cont prn nebs 5 MOM for constipation 6 cardiology assisting with management 7 recheck labs [facility]ie [name] 09/13/2019 7:15 AM  Pager [phone]   I have seen and examined the patient and agree with the assessment and plan as outlined.  Hypertension overnight which over-corrected with IV labetalol.  Will plan to use low dose oral hydralazine instead of IV labetalol or hydralazine as needed for hypertension in an effort to achieve more gradual correction.  Mobilize.  Family has requested to meet w/ palliative care team - still waiting for input.  Potentially home 1-2 days if BP under reasonable control.  I spent in excess of 15 minutes during the conduct of this hospital encounter and >50% of this time  involved direct face-to-face encounter with the patient for counseling and/or coordination of their care.   Laurie Alberts, MD 09/13/2019 8:57 AM

## 2019-09-13 NOTE — Telephone Encounter (Signed)
Please review for refill, Thanks !  

## 2019-09-13 NOTE — Progress Notes (Addendum)
Progress Note  Patient Name: Laurie [name] Date of Encounter: 09/13/2019  Primary Cardiologist: [name], MD   Subjective   O/N events: Hypertensive to 170/62. Given hydralazine.  Mrs.Gunner was examined and evaluated at bedside this AM. She states earlier this morning she had some chest pain that radiated down to her stomach described as burning sensation. She mentions this was exacerbated by eating breakfast, including drinking some coffee. She mentions that she feels uncomfortable and bloated and mentions that her last bowel movement was on Sunday as well. Denies any chest pressure, palpitations, dyspnea or light-headedness.  Inpatient Medications    Scheduled Meds: . amiodarone  200 mg Oral Daily  . Chlorhexidine Gluconate Cloth  6 each Topical Daily  . lisinopril  5 mg Oral Daily  . sodium chloride flush  3 mL Intravenous Q12H   Continuous Infusions: . sodium chloride Stopped (09/12/19 1133)   PRN Meds: acetaminophen **OR** acetaminophen, albuterol, ALPRAZolam, furosemide, HYDROcodone-acetaminophen, influenza vaccine adjuvanted, labetalol, ondansetron **OR** ondansetron (ZOFRAN) IV, senna-docusate, traMADol   Vital Signs    Vitals:   09/13/19 0615 09/13/19 0630 09/13/19 0645 09/13/19 0700  BP: (!) 105/48 (!) 102/48 (!) 116/53 90/77  Pulse: 61 (!) 59 (!) 59 61  Resp: 13 15 14 19   Temp:      TempSrc:      SpO2: 94% 95% 95% 95%  Weight:      Height:        Intake/Output Summary (Last 24 hours) at 09/13/2019 0703 Last data filed at 09/13/2019 0353 Gross per 24 hour  Intake 48.05 ml  Output 1100 ml  Net -1051.95 ml   Filed Weights   09/11/19 1558 09/12/19 0500 09/13/19 0448  Weight: 79.9 kg 77.1 kg 75.9 kg    Telemetry    Normal sinus, HR 60-80 - Personally Reviewed  ECG    Normal sinus, normal axis, Q waves in Septal Leads, mild ST depression in lateral leads consistent with prior EKGs. - Personally Reviewed  Physical Exam   Gen:  Well-developed, well nourished, NAD HEENT: NCAT head, poor hearing, MMM Neck: supple, ROM intact, no JVD CV: RRR, S1, S2 normal, 3/6 holosystolic murmur Pulm: CTAB, No rales, no wheezes Abd: Soft, BS+, Epigastric tenderness to deep palpation Extm: ROM intact, Peripheral pulses intact, No peripheral edema Skin: Dry, Warm, normal turgor  Labs    Chemistry Recent Labs  Lab 09/11/19 1536 09/12/19 0228  NA 126* 125*  K 3.9 4.1  CL 88* 89*  CO2 29 25  GLUCOSE 131* 90  BUN 7* 6*  CREATININE 0.90 0.83  CALCIUM 9.0 8.8*  GFRNONAA 60* >60  GFRAA >60 >60  ANIONGAP 9 11     Hematology Recent Labs  Lab 09/11/19 1536 09/12/19 0228  WBC 7.4 6.9  RBC 4.77 4.17  HGB 14.1 12.9  HCT 43.0 36.6  MCV 90.1 87.8  MCH 29.6 30.9  MCHC 32.8 35.2  RDW 13.2 13.3  PLT 252 209    Cardiac EnzymesNo results for input(s): TROPONINI in the last 168 hours. No results for input(s): TROPIPOC in the last 168 hours.   BNPNo results for input(s): BNP, PROBNP in the last 168 hours.   DDimer No results for input(s): DDIMER in the last 168 hours.   Radiology    Ct Chest W Contrast  Result Date: 09/11/2019 CLINICAL DATA:  Increased prominence of the ascending thoracic aortic contour by chest x-ray with known aneurysmal disease of the ascending thoracic aorta and history of prior aortic valve  replacement in 2009. Bilateral lower extremity pain. EXAM: CT CHEST WITH CONTRAST TECHNIQUE: Multidetector CT imaging of the chest was performed during intravenous contrast administration. CONTRAST:  37mL OMNIPAQUE IOHEXOL 300 MG/ML  SOLN COMPARISON:  CT of the chest without contrast on 12/17/2018 and CTA of the chest on 08/24/2016 FINDINGS: Cardiovascular: Aortic valve plane shows stable appearance of a prosthetic aortic valve. Just above the valve plane, there is significant enlargement of the ascending thoracic aorta since prior studies which now measures 7-7.1 cm in greatest diameter. There is an associated  dissection with irregular intimal flap now extending in the lumen up to the level of the proximal arch. This approaches the origin of the innominate artery but does not visibly enter the origin. The dissection does not involve the rest of the arch or the descending thoracic aorta. No evidence of associated mediastinal or pleural hemorrhage. The proximal arch measures approximately 3.9 cm and the distal arch 3.3 cm. The mid descending thoracic aorta measures 2.8 cm. There is slight bulge in contour of the distal descending thoracic aorta which measures up to 3.7 cm in greatest diameter before tapering to 2.9 cm just above the diaphragmatic hiatus. The heart size is stable and within normal limits. Stable appearance of a dual-chamber pacemaker. No pericardial fluid identified. Stable calcified coronary artery plaque. Mediastinum/Nodes: No enlarged mediastinal, hilar, or axillary lymph nodes. Thyroid gland, trachea, and esophagus demonstrate no significant findings. Lungs/Pleura: Stable chronic lung disease with pulmonary scarring, right posterior pleural thickening, calcified pleural plaque and areas of pulmonary nodularity including a stable 7 mm posterior left lower lobe nodule. There is no evidence of pulmonary edema, consolidation, pneumothorax or pleural fluid. Upper Abdomen: Stable pneumobilia. Musculoskeletal: No chest wall abnormality. No acute or significant osseous findings. 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 The ascending thoracic aorta now measures 7-7.1 cm in greatest diameter. These results will be called to the ordering clinician or representative by the Radiologist Assistant, and communication documented in the PACS or zVision Dashboard. Electronically Signed   By: Aletta Edouard M.D.   On: 09/11/2019 12:01    Cardiac Studies    04/19/2019-echocardiogram  1. The left ventricle appears hypertrophied with hyperdynamic systolic function, with an ejection fraction of >65%. The cavity size was normal. 2. The right ventricle has normal systolic function. The cavity was normal. There is no increase in right ventricular wall thickness. 3. The bioprosthetic AVR is poorly imaged, doppler parameters are normal  Patient Profile     84 y.o. female w/ PMH of A.fib s/p pacemaker placement, CAD, aortic stenosis s/p aortic valve replacement, PAD, HTN, HLD, and lung ca s/p lobectomy admit for aortic dissection. Cardiology consulted for BP management.  Assessment & Plan    Type A dissecting thoracic aortic artery aneurysm 7-7.1cm Hx of AAA Found on CT chest for pulmonary work-up. On lisinopril 5mg  w/ labetalol PRN. Cardiology consulted for bp management. Episode of hypotension yesterday after labetalol. Spiked up to 170 overnight but with quick resolution after hydralazine - C/w lisinopril 5mg  daily (hold parameter if BP <100) - Add amlodipine if recurrent systolic BP >462  Burning Chest Pain Constipation Description sounds like reflux disease.  - Started on pantoprazole, magnesium hydroxide per primary  Hyponatremia Na 130->125 after receiving fluids yesterday. Likely due to hypervolemia. On lasix 20 at home - Hold furosemide for now due to hypotension - Consider  adding IV lasix if bp starts to trend up - Trend BMP  A.fib s/p pacemaker Currently in sinus - C/w amiodarone - Telemetry   For questions or updates, please contact [facility] Please consult [email or web address] for contact info under Cardiology/STEMI.      Signed, Gilberto Better, MD PGY-2, [place] [facility] Pager: [phone] 09/13/2019, 7:03 AM    Attending attestation to follow  I have seen and examined the patient along with Gilberto Better, MD.  I have reviewed the chart, notes and new data.  I agree with PA/NP's note.  Key new complaints: no chest pain or  dyspnea, had some chest "burning" last night when BP was low Key examination changes: RRR, loud aortic ejection murmur Key new findings / data: full pacemaker interrogation reviewed. Normal device function. Only requires 18% A pacing. No need for V pacing. <1% Afib, none since April.  PLAN: Medical management planned for large Asc Ao aneurysm with complex dissection, limited to the ascending aorta, of uncertain chronicity. Nomal pacemaker function. I think bradycardia was an artifactual recording. Off Xarelto. Preferable BP agent would be a beta blocker. Right now BP is low at 100/56 so will not add yet. Stop lisinopril. Prognosis is very guarded, but he symptoms are mild, quality of life is good and the surgical risk is extremely high.  She does not want aggressive care, specifically no surgery. Palliative care consultation is appropriate.  Sanda Klein, MD, [place] [phone] 09/13/2019, 9:24 AM

## 2019-09-13 NOTE — Progress Notes (Signed)
Patient arrived from [facility] to [id number]. Patient placed on monitor, CCMD made aware  and vital signs obtained. bp 186/89 heart rate 68 on monitor. Patient with Hydralazine PRN per report from 2 H, Hydralazine PO given as ordered as needed for SBP >140. Will continue to monitor patient. Greenley Martone, Bettina Gavia rN

## 2019-09-14 ENCOUNTER — Encounter (HOSPITAL_COMMUNITY): Payer: Self-pay | Admitting: Thoracic Surgery (Cardiothoracic Vascular Surgery)

## 2019-09-14 DIAGNOSIS — Z7189 Other specified counseling: Secondary | ICD-10-CM

## 2019-09-14 DIAGNOSIS — I712 Thoracic aortic aneurysm, without rupture: Secondary | ICD-10-CM

## 2019-09-14 DIAGNOSIS — Z515 Encounter for palliative care: Secondary | ICD-10-CM

## 2019-09-14 LAB — BASIC METABOLIC PANEL
Anion gap: 10 (ref 5–15)
BUN: 7 mg/dL — ABNORMAL LOW (ref 8–23)
CO2: 24 mmol/L (ref 22–32)
Calcium: 8.7 mg/dL — ABNORMAL LOW (ref 8.9–10.3)
Chloride: 95 mmol/L — ABNORMAL LOW (ref 98–111)
Creatinine, Ser: 0.82 mg/dL (ref 0.44–1.00)
GFR calc Af Amer: >60 mL/min (ref >60)
GFR calc non Af Amer: >60 mL/min (ref >60)
Glucose, Bld: 102 mg/dL — ABNORMAL HIGH (ref 70–99)
Potassium: 3.9 mmol/L (ref 3.5–5.1)
Sodium: 129 mmol/L — ABNORMAL LOW (ref 135–145)

## 2019-09-14 LAB — GLUCOSE, CAPILLARY
Glucose-Capillary: 101 mg/dL — ABNORMAL HIGH (ref 70–99)
Glucose-Capillary: 101 mg/dL — ABNORMAL HIGH (ref 70–99)
Glucose-Capillary: 118 mg/dL — ABNORMAL HIGH (ref 70–99)
Glucose-Capillary: 136 mg/dL — ABNORMAL HIGH (ref 70–99)

## 2019-09-14 MED ORDER — HYDRALAZINE HCL 10 MG PO TABS
10.0000 mg | ORAL_TABLET | ORAL | Status: DC | PRN
  Administered 2019-09-14: 14:00:00 10 mg via ORAL
  Filled 2019-09-14: qty 1

## 2019-09-14 MED ORDER — CARVEDILOL 6.25 MG PO TABS
6.2500 mg | ORAL_TABLET | Freq: Two times a day (BID) | ORAL | Status: DC
  Administered 2019-09-14: 10:00:00 6.25 mg via ORAL
  Filled 2019-09-14: qty 1

## 2019-09-14 MED ORDER — CARVEDILOL 12.5 MG PO TABS
12.5000 mg | ORAL_TABLET | Freq: Two times a day (BID) | ORAL | Status: DC
  Administered 2019-09-14 – 2019-09-15 (×2): 12.5 mg via ORAL
  Filled 2019-09-14 (×2): qty 1

## 2019-09-14 NOTE — Progress Notes (Addendum)
Progress Note  Patient Name: Laurie [name] Date of Encounter: 09/14/2019  Primary Cardiologist: [name], MD   Subjective   O/N events: Family meeting postponed until 10/15. Received hydralazine 10mg  overnight for BP 186/86.   Mrs.Macmullen was examined and evaluated at bedside this AM. She mentions having no symptoms and stating that she wishes to be discharged soon. Mentions that the bed is uncomfortable and hospital food does not agree with her. States burning sensation from yesterday has mostly resolved. Denies any new chest pain, palpitations, dyspnea. Discussed plan to control her bp by restarting her antihypertensives and look to discharge soon if bp is stable. Mrs.Closson expressed understanding.  Inpatient Medications    Scheduled Meds: . amiodarone  200 mg Oral Daily  . Chlorhexidine Gluconate Cloth  6 each Topical Daily  . lisinopril  5 mg Oral Daily  . pantoprazole  40 mg Oral Daily  . sodium chloride flush  3 mL Intravenous Q12H   Continuous Infusions: . sodium chloride Stopped (09/12/19 1133)   PRN Meds: acetaminophen **OR** acetaminophen, albuterol, ALPRAZolam, hydrALAZINE, HYDROcodone-acetaminophen, influenza vaccine adjuvanted, magnesium hydroxide, ondansetron **OR** ondansetron (ZOFRAN) IV, senna-docusate, traMADol   Vital Signs    Vitals:   09/13/19 1900 09/14/19 0005 09/14/19 0429 09/14/19 0500  BP: (!) 122/107 127/64 (!) 147/64 (!) 135/59  Pulse: 70 68 71 71  Resp: 19 15 16 15   Temp: 97.9 F (36.6 C) (!) 97.1 F (36.2 C) 97.8 F (36.6 C)   TempSrc: Oral Oral Oral   SpO2: 95% 96% 94% 93%  Weight:    73.7 kg  Height:        Intake/Output Summary (Last 24 hours) at 09/14/2019 0711 Last data filed at 09/13/2019 2029 Gross per 24 hour  Intake 100 ml  Output 950 ml  Net -850 ml   Filed Weights   09/12/19 0500 09/13/19 0448 09/14/19 0500  Weight: 77.1 kg 75.9 kg 73.7 kg    Telemetry    Normal sinus, HR 60-80 - Personally  Reviewed  ECG    Normal sinus, normal axis, Q waves in Septal Leads, mild ST depression in lateral leads consistent with prior EKGs. - Personally Reviewed  Physical Exam   Gen: Well-developed, well nourished, NAD HEENT: NCAT head, poor hearing, MMM Neck: supple, ROM intact, no JVD CV: RRR, S1, S2 normal, 3/6 holosystolic murmur Pulm: CTAB, No rales, no wheezes Abd: Soft, BS+, non-tender Extm: ROM intact, Peripheral pulses intact, No peripheral edema Skin: Dry, Warm, normal turgor  Labs    Chemistry Recent Labs  Lab 09/12/19 0228 09/13/19 0959 09/14/19 0400  NA 125* 126* 129*  K 4.1 4.0 3.9  CL 89* 91* 95*  CO2 25 25 24   GLUCOSE 90 132* 102*  BUN 6* 8 7*  CREATININE 0.83 0.82 0.82  CALCIUM 8.8* 8.9 8.7*  GFRNONAA >60 >60 >60  GFRAA >60 >60 >60  ANIONGAP 11 10 10      Hematology Recent Labs  Lab 09/11/19 1536 09/12/19 0228 09/13/19 0959  WBC 7.4 6.9 7.1  RBC 4.77 4.17 4.22  HGB 14.1 12.9 13.0  HCT 43.0 36.6 37.3  MCV 90.1 87.8 88.4  MCH 29.6 30.9 30.8  MCHC 32.8 35.2 34.9  RDW 13.2 13.3 13.3  PLT 252 209 186    Cardiac EnzymesNo results for input(s): TROPONINI in the last 168 hours. No results for input(s): TROPIPOC in the last 168 hours.   BNPNo results for input(s): BNP, PROBNP in the last 168 hours.   DDimer No results  for input(s): DDIMER in the last 168 hours.   Radiology    No results found.  Cardiac Studies   04/19/2019-echocardiogram  1. The left ventricle appears hypertrophied with hyperdynamic systolic function, with an ejection fraction of >65%. The cavity size was normal. 2. The right ventricle has normal systolic function. The cavity was normal. There is no increase in right ventricular wall thickness. 3. The bioprosthetic AVR is poorly imaged, doppler parameters are normal  Patient Profile     84 y.o. female w/ PMH of A.fib s/p pacemaker placement, CAD, aortic stenosis s/p aortic valve replacement, PAD, HTN, HLD, and lung ca s/p  lobectomy admit for aortic dissection. Cardiology consulted for BP management.  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 Current BP 140/70 this am. - Start carvedilol 6.25mg  BID  - C/w lisinopril - Hydrazine PRN for systolic bp >062  Burning Chest Pain Constipation Resolved  Hyponatremia Improving with oral repletion. Na 126->129 this am. Volume exam slightly hyprevolemic - Consider adding IV lasix if bp starts to trend up - Trend BMP  A.fib s/p pacemaker Currently in sinus. Pacemaker data reviewed yesterday w/ no abnormalities - C/w amiodarone - Telemetry   For questions or updates, please contact [place] Please consult [email or web address] for contact info under Cardiology/STEMI.   Signed, Gilberto Better, MD PGY-2, [facility] Pager: [phone] 09/14/2019, 7:11 AM    Attending attestation to follow   I have seen and examined the patient along with Gilberto Better, MD.  I have reviewed the chart, notes and new data.  I agree with PA/NP's note.  Key new complaints: No chest discomfort.  Did not sleep too well because she likes her room really dark. Key examination changes: No evidence of peripheral arterial involvement/pulse deficit.  Blood pressure elevated. Key new findings / data: No major arrhythmia seen on telemetry other than occasional PACs and PVCs.  Very infrequent atrial pacing.  PLAN: Start beta-blocker today.  Depending on blood pressure response may be able to discontinue her lisinopril.  She was poorly tolerant of metoprolol in the past but this was due to bradycardia, prior to pacemaker placement.  Sanda Klein, MD, [place] [phone] 09/14/2019, 8:40 AM

## 2019-09-14 NOTE — Progress Notes (Addendum)
      [address]       [place] [address]             [phone]         Subjective: BP quite variable with systolic 66-063 range, diastolic 01-601 She is anxious to go home  Objective: Vital signs in last 24 hours: Temp:  [97.1 F (36.2 C)-98.9 F (37.2 C)] 97.8 F (36.6 C) (10/15 0429) Pulse Rate:  [59-74] 71 (10/15 0500) Cardiac Rhythm: A-V Sequential paced (10/15 0017) Resp:  [14-27] 15 (10/15 0500) BP: (99-186)/(43-107) 135/59 (10/15 0500) SpO2:  [93 %-97 %] 93 % (10/15 0500) Weight:  [73.7 kg] 73.7 kg (10/15 0500)  Hemodynamic parameters for last 24 hours:    Intake/Output from previous day: 10/14 0701 - 10/15 0700 In: 100 [P.O.:100] Out: 950 [Urine:950] Intake/Output this shift: No intake/output data recorded.  General appearance: alert, cooperative and no distress Heart: regular rate and rhythm and 2/6 holosystolic murmur, RRR Lungs: less ronchi Abdomen: benign Extremities: no edema Wound: n/a  Lab Results: Recent Labs    09/12/19 0228 09/13/19 0959  WBC 6.9 7.1  HGB 12.9 13.0  HCT 36.6 37.3  PLT 209 186   BMET:  Recent Labs    09/13/19 0959 09/14/19 0400  NA 126* 129*  K 4.0 3.9  CL 91* 95*  CO2 25 24  GLUCOSE 132* 102*  BUN 8 7*  CREATININE 0.82 0.82  CALCIUM 8.9 8.7*    PT/INR: No results for input(s): LABPROT, INR in the last 72 hours. ABG    Component Value Date/Time   PHART 7.310 (L) 08/28/2008 1811   HCO3 23.4 08/28/2008 1811   TCO2 24 08/29/2008 1700   ACIDBASEDEF 3.0 (H) 08/28/2008 1811   O2SAT 98.0 08/28/2008 1811   CBG (last 3)  Recent Labs    09/13/19 1138 09/13/19 1725 09/13/19 2113  GLUCAP 97 102* 101*    Meds Scheduled Meds: . amiodarone  200 mg Oral Daily  . Chlorhexidine Gluconate Cloth  6 each Topical Daily  . lisinopril  5 mg Oral Daily  . pantoprazole  40 mg Oral Daily  . sodium chloride flush  3 mL Intravenous Q12H   Continuous Infusions: . sodium chloride Stopped (09/12/19  1133)   PRN Meds:.acetaminophen **OR** acetaminophen, albuterol, ALPRAZolam, hydrALAZINE, HYDROcodone-acetaminophen, influenza vaccine adjuvanted, magnesium hydroxide, ondansetron **OR** ondansetron (ZOFRAN) IV, senna-docusate, traMADol  Xrays No results found.  Assessment/Plan:  1 stable except with significant BP variability- cont to adjust, cardiology assisting with med management 2 laxatives prn 3 palliative care to meet with patient today   S LOS: 3 days    John Giovanni PA-C 09/14/2019 Pager [phone]  I have seen and examined the patient and agree with the assessment and plan as outlined.  Home when BP under better control.  Rexene Alberts, MD 09/14/2019

## 2019-09-14 NOTE — Progress Notes (Signed)
CARDIAC REHAB PHASE I   PRE:  Rate/Rhythm: 60 pacing    BP: sitting 155/67    SaO2: 94 RA  MODE:  Ambulation: 190 ft   POST:  Rate/Rhythm: 68 SR    BP: sitting 174/78     SaO2: 94 RA  Pt upset on arrival about not eating yet. Able to stand and walk fairly independent with RW, had assist x2 for safety. C/o lower legs burning which she sts has been present for a few weeks. To recliner. Denied CP or back pain. Will f/u tomorrow. [place], ACSM 09/14/2019 1:43 PM

## 2019-09-14 NOTE — Care Management Important Message (Signed)
Important Message  Patient Details  Name: DIANNAH RINDFLEISCH MRN: [id number] Date of Birth: Jan 10, 1935   Medicare Important Message Given:  Yes     Shelda Altes 09/14/2019, 2:05 PM

## 2019-09-14 NOTE — Consult Note (Signed)
Consultation Note Date: 09/14/2019   Patient Name: Laurie [name]  DOB: 1935/01/25  MRN: [id number]  Age / Sex: 84 y.o., female   PCP: Laurie Marry, MD Referring Physician: Rexene Alberts, MD   REASON FOR CONSULTATION:Establishing goals of care  Palliative Care consult requested for this 84 y.o. female with multiple medical problems including pacemaker, tachy-brady syndrome, atrial fibrillation, COPD, aortic valve disease s/p replacement (09/2008), and thoracic aortic aneurysm, and lung cancer s/p lobectomy. She presented to ED with complaints of bilateral leg and abdominal pain. Her work-up revealed an ascending aortic aneurysm with dissection. She is being followed by CTS and Cardiology. Recommendations for medical management given high risk for surgical intervention. She continues to have variations in BP. Palliative Medicine consulted for goals of care.   Clinical Assessment and Goals of Care: I have reviewed medical records including lab results, imaging, Epic notes, and MAR, received report from the bedside RN, and assessed the patient. I met at the bedside with patient, her son Laurie [name]) and daughter-in-law, Laurie [name] to discuss diagnosis prognosis, GOC, EOL wishes, disposition and options. Patient is awake, alert and oriented x4. She denies pain or shortness of breath. She is hard of hearing.   I introduced Palliative Medicine as specialized medical care for people living with serious illness. It focuses on providing relief from the symptoms and stress of a serious illness. The goal is to improve quality of life for both the patient and the family.  We discussed a brief life review of the patient, along with her functional and nutritional status. Laurie [name] lives alone with her dog Laurie [name]. She had 2 sons but unfortunately one is deceased. She is a retired Dispensing optician.   Prior to admission she and family reports she was able to independent perform  all ADLs. She was driving and run errands alone. She reports the use of a walker at times due to gait instability, which seems to have worsen over the past month. Family shares she had a fall several weeks ago with not injury. Her appetite has declined over the past month. She reports food does not taste the same and often "doesn't sit right in her stomach". She shares she eats a lot of fruit and drinks boost. She also relates some of her change in appetite to her recent upper teeth extraction and getting used to her new dentures.   We discussed Her current illness and what it means in the larger context of Her on-going co-morbidities. With specific discussions regarding aortic aneurysm and overall functional and nutritional decline. Natural disease trajectory and expectations at EOL were discussed. Patient and family verbalized full understanding of her current illness and prognosis. Patient reports she is not interested in aggressive interventions such as surgery stating "I would rather just pass when it is time versus taking my chances during surgery or trying to recover from a surgery". Support given.   I attempted to elicit values and goals of care important to the patient.    The difference between aggressive medical intervention and comfort care was considered in light of the patient's goals of care. Son and daughter-in-law shares their concerns with her noticeable decline and worries about a sudden death event. Patient remains hopeful and states, she wants to return home and get back to life and "deal with it when it happens" We discussed possible signs of further decline including pain, shortness of breath, weakness, continued poor po intake. Patient and family verbalized  understanding.   I used this opportunity to allow patient to share her goals in the event of a decline after discharge. She shares she does not like coming back and forth to the hospital but would not want to be at home if she had  severe complications and not have treatment. She confirms DNR/DNI, no artificial feeding, and that she would not want to be in ICU as she hates the "heavy cardiac monitor". I educate and review the MOST form with patient and family. Patient requested to complete documentation giving her daughter in law permission to mark her expressed wishes and sign. Patient selections on MOST are for DNR, limited interventions, determined use or limitation of antibiotics, IV fluids for a defined trial, no feeding tube. She uses the scenario she would not want surgery but if she fell as she did previously and broke a bone or had a severe UTI she would not want to stay home to be treated as she would be miserable.   We discussed the risk of sudden death and awareness that her health will decline at some point. She and family verbalized understanding. She states if her health declines overall with no major related event such as a fall, but because of her co-morbidities she would then wish to be kept at home with family and pass away there. We also discussed safety precautions she should take upon returning home such as no heavy lifting, changing positions slowly, no driving or extraneous activity. Patient and family verbalized understanding. Family also shared that their granddaughter is a Designer, jewellery and is going to come and visit and spend some time with patient.   Hospice and Palliative Care services outpatient were explained and offered. Patient and family verbalized their understanding and awareness of both palliative and hospice's goals and philosophy of care. Family was more open to a hospice approach given the severity of her condition however, patient states she is not in agreement. She is focused on returning home an maintaining some independence and not "just giving up" until she seems more symptomatic and can not do for herself. We discussed being hopeful but also being prepared for the worst. They verbalized  understanding. Son states he does not want to go against his mothers wishes and will go with what she wants as long as there is a plan. Patient expressed she wished to return home with family if possible with palliative support, with awareness and agreement if she further declines or realize she is not as strong and independent as she was previously she would then be open to hospice. Family verbalized agreement. Family requesting for home PT if patient given expressed wishes if needed. We discussed having PT to evaluate her prior to discharge and make recommendations based on their assessment. Family verbalized understanding and appreciation.    Questions and concerns were addressed.  Hard Choices booklet left for review. The family was encouraged to call with questions or concerns.  PMT will continue to support holistically.   SOCIAL HISTORY:     reports that she has quit smoking. She quit after 40.00 years of use. She has never used smokeless tobacco. She reports that she does not drink alcohol or use drugs.  CODE STATUS: DNR  ADVANCE DIRECTIVES: Madelaine Bhat and Shella Spearing (POA)  SYMPTOM MANAGEMENT: per attending   Palliative Prophylaxis:   Bowel Regimen, Frequent Pain Assessment and Oral Care  PSYCHO-SOCIAL/SPIRITUAL:  Support System: Family   Desire for further Chaplaincy support:NO   Additional Recommendations (Limitations,  Scope, Preferences):  Full Scope Treatment and Continue to treat the treatable, no escalation of care, DNR   PAST MEDICAL HISTORY: Past Medical History:  Diagnosis Date   AAA (abdominal aortic aneurysm) ([place])    Anemia    iron deficiency   Ascending aorta dilatation ([facility])    Atrial fibrillation ([place])    post op   CAD (coronary artery disease)    Carcinoma of lung ([place]) 08/28/2008   T2N0 moderately diff. squamous cell CA   Cerebrovascular disease    COPD (chronic obstructive pulmonary disease) ([place]) 2005   on CXR with R apical scaring     Dissecting aneurysm of ascending aorta ([place]) 09/11/2019   Not a candidate for surgical intervention on medical treatment for BP control   Headache(784.0)    Hx: of migraines in the past   Hypercholesterolemia    Hx: of   Hypertension    Incidental pulmonary nodule, > 10m and < 811m   LLL   S/P aortic valve replacement 08/28/2008   owen   S/P lobectomy of lung 08/28/2008   rt middle lobe  dr. owRoxy Manns Shingles 11-09   Thoracic aortic dissection [place]/[place]    PAST SURGICAL HISTORY:  Past Surgical History:  Procedure Laterality Date   ABDOMINAL SURGERY     AORTIC VALVE REPLACEMENT  08/28/2008   #[id number] Magna Ease pericardial tissue valve   APPENDECTOMY     CARDIAC CATHETERIZATION     CATARACT EXTRACTION W/ INTRAOCULAR LENS  IMPLANT, BILATERAL Bilateral    CHOLECYSTECTOMY N/A 12/13/2013   Procedure: LAPAROSCOPIC CHOLECYSTECTOMY;  Surgeon: ArmRalene OkD;  [facility];  Laterality: N/A;   COLONOSCOPY     \   DILATION AND CURETTAGE OF UTERUS     ERCP N/A 05/08/2015   Procedure: ENDOSCOPIC RETROGRADE CHOLANGIOPANCREATOGRAPHY (ERCP);  Surgeon: RobInda CastleD;  [facility];  Laterality: N/A;   ERCP N/A 01/13/2016   Procedure: ENDOSCOPIC RETROGRADE CHOLANGIOPANCREATOGRAPHY (ERCP);  Surgeon: HenDoran StablerD;  [facility];  Service: Endoscopy;  Laterality: N/A;   ERCP N/A 03/09/2016   Procedure: ENDOSCOPIC RETROGRADE CHOLANGIOPANCREATOGRAPHY (ERCP);  Surgeon: MalLadene ArtistD;  [facility];  Service: Endoscopy;  Laterality: N/A;   PACEMAKER IMPLANT N/A 12/05/2018   Procedure: PACEMAKER IMPLANT;  Surgeon: TayEvans LanceD;  [facility];  Service: Cardiovascular;  Laterality: N/A;   RML removed Dr OweRicard Dillonr lung cancer  08/28/2008   T2N0 squamous cell CA   TONSILLECTOMY AND ADENOIDECTOMY     u/s guided aspiration of R breast cyst   [facility]   [facility]    ALLERGIES:  is allergic to hyoscyamine; rofecoxib; statins; and metoprolol.   MEDICATIONS:  Current Facility-Administered Medications  Medication Dose Route Frequency Provider Last Rate Last Dose   0.45 % sodium chloride infusion   Intravenous Continuous OweRexene AlbertsD   Stopped at 09/12/19 1133   acetaminophen (TYLENOL) tablet 650 mg  650 mg Oral Q6H PRN OweRexene AlbertsD   650 mg at 09/12/19 0840   Or   acetaminophen (TYLENOL) suppository 650 mg  650 mg Rectal Q6H PRN OweRexene AlbertsD       albuterol (PROVENTIL) (2.5 MG/3ML) 0.083% nebulizer solution 2.5 mg  2.5 mg Nebulization Q4H PRN OweRexene AlbertsD   2.5 mg at 09/14/19 1137   ALPRAZolam (XANAX) tablet 0.5 mg  0.5 mg Oral  BID PRN Laurie Alberts, MD   0.5 mg at 09/13/19 2157   amiodarone (PACERONE) tablet 200 mg  200 mg Oral Daily Laurie Alberts, MD   200 mg at 09/14/19 9242   carvedilol (COREG) tablet 12.5 mg  12.5 mg Oral BID WC Mosetta Anis, MD   12.5 mg at 09/14/19 1835   Chlorhexidine Gluconate Cloth 2 % PADS 6 each  6 each Topical Daily Laurie Alberts, MD   6 each at 09/14/19 6834   hydrALAZINE (APRESOLINE) tablet 10 mg  10 mg Oral Q4H PRN Laurie Alberts, MD   10 mg at 09/14/19 1416   HYDROcodone-acetaminophen (NORCO/VICODIN) 5-325 MG per tablet 1 tablet  1 tablet Oral TID PRN Laurie Alberts, MD   1 tablet at 09/14/19 0620   influenza vaccine adjuvanted (FLUAD) injection 0.5 mL  0.5 mL Intramuscular Prior to discharge Laurie Alberts, MD       lisinopril (ZESTRIL) tablet 5 mg  5 mg Oral Daily Laurie Alberts, MD   5 mg at 09/14/19 1962   magnesium hydroxide (MILK OF MAGNESIA) suspension 30 mL  30 mL Oral Daily PRN Laurie Alberts, MD       ondansetron [facility]) tablet 4 mg  4 mg Oral Q6H PRN Laurie Alberts, MD       Or   ondansetron [facility]) injection 4 mg  4 mg Intravenous Q6H PRN Laurie Alberts, MD       pantoprazole (PROTONIX) EC tablet 40 mg  40 mg Oral  Daily Laurie Alberts, MD   40 mg at 09/14/19 2297   senna-docusate (Senokot-S) tablet 1 tablet  1 tablet Oral QHS PRN Laurie Alberts, MD   1 tablet at 09/14/19 0330   sodium chloride flush (NS) 0.9 % injection 3 mL  3 mL Intravenous Q12H Laurie Alberts, MD   3 mL at 09/14/19 0938   traMADol (ULTRAM) tablet 50 mg  50 mg Oral Q6H PRN Laurie Alberts, MD   50 mg at 09/14/19 0330    VITAL SIGNS: BP (!) 144/69 (BP Location: Left Arm)    Pulse 63    Temp 98.1 F (36.7 C) (Oral)    Resp 17    Ht '5\' 6"'  (1.676 m)    Wt 73.7 kg    SpO2 95%    BMI 26.21 kg/m  Filed Weights   09/12/19 0500 09/13/19 0448 09/14/19 0500  Weight: 77.1 kg 75.9 kg 73.7 kg    Estimated body mass index is 26.21 kg/m as calculated from the following:   Height as of this encounter: '5\' 6"'  (1.676 m).   Weight as of this encounter: 73.7 kg.  LABS: CBC:    Component Value Date/Time   WBC 7.1 09/13/2019 0959   HGB 13.0 09/13/2019 0959   HCT 37.3 09/13/2019 0959   PLT 186 09/13/2019 0959   Comprehensive Metabolic Panel:    Component Value Date/Time   NA 129 (L) 09/14/2019 0400   K 3.9 09/14/2019 0400   CO2 24 09/14/2019 0400   BUN 7 (L) 09/14/2019 0400   CREATININE 0.82 09/14/2019 0400   CREATININE 0.87 08/11/2019 1054   ALBUMIN 3.8 01/17/2019 1523     Review of Systems  Constitutional: Positive for appetite change.  Gastrointestinal: Positive for constipation.  Neurological: Positive for weakness.  Unless otherwise noted, a complete review of systems is negative.  Physical Exam General: NAD, well-developed Cardiovascular: regular rate and rhythm, murmur Pulmonary: bilateral wheezing  and rhonchi Abdomen: soft, nontender, + bowel sounds Extremities: no edema, no joint deformities Skin: no rashes, warm, dry, intact Neurological: Weakness, A&O x3, hard of hearing, mood appropriate   Prognosis: Unable to determine-Guarded to Poor   Discharge Planning:  Home with Palliative  Services  Recommendations:  DNR/DNI-as confirmed by patient/son  Continue with current plan of care per attending  MOST/DNR completed at patient/family request, placed on chart for patient to take home with her.   Patient declining hospice at this time, requesting palliative with plans to transition to hospice when needed. (referral placed-contact daughter-in-law for information and needs)  PMT will continue to support and follow.    Palliative Performance Scale: PPS 50% with poor appetite                Patient and family expressed understanding and was in agreement with this plan.   Thank you for allowing the Palliative Medicine Team to assist in the care of this patient.  Time In: 1000 Time Out: 1120 Time Total: 80 min.   Visit consisted of counseling and education dealing with the complex and emotionally intense issues of symptom management and palliative care in the setting of serious and potentially life-threatening illness.Greater than 50%  of this time was spent counseling and coordinating care related to the above assessment and plan.  Signed by:  Alda Lea, AGPCNP-BC Palliative Medicine Team  Phone: [phone] Fax: [phone] Pager: [phone] Amion: Bjorn Pippin

## 2019-09-14 NOTE — Evaluation (Signed)
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 Pt to be treated medicallyPMH - afib, copd, htn, avr,   Clinical Impression  Pt doing well with mobility and no further PT needed.  Ready for dc from PT standpoint.      Follow Up Recommendations No PT follow up    Equipment Recommendations  None recommended by PT    Recommendations for Other Services       Precautions / Restrictions Precautions Precautions: None      Mobility  Bed Mobility Overal bed mobility: Modified Independent                Transfers Overall transfer level: Modified independent Equipment used: Rolling walker (2 wheeled)                Ambulation/Gait Ambulation/Gait assistance: Modified independent (Device/Increase time) Gait Distance (Feet): 200 Feet Assistive device: Rolling walker (2 wheeled) Gait Pattern/deviations: Step-through pattern;Decreased stride length   Gait velocity interpretation: 1.31 - 2.62 ft/sec, indicative of limited community ambulator General Gait Details: Steady gait with walker and VSS.  Stairs            Wheelchair Mobility    Modified Rankin (Stroke Patients Only)       Balance Overall balance assessment: Mild deficits observed, not formally tested                                           Pertinent Vitals/Pain Pain Assessment: No/denies pain    Home Living Family/patient expects to be discharged to:: Private residence Living Arrangements: Alone Available Help at Discharge: Family Type of Home: House Home Access: Stairs to enter Entrance Stairs-Rails: Left Entrance Stairs-Number of Steps: 2 Home Layout: One level Home Equipment: [place] - 2 wheels;Cane - single point      Prior Function Level of  Independence: Independent               Hand Dominance   Dominant Hand: Right    Extremity/Trunk Assessment   Upper Extremity Assessment Upper Extremity Assessment: Overall WFL for tasks assessed    Lower Extremity Assessment Lower Extremity Assessment: Overall WFL for tasks assessed       Communication   Communication: HOH  Cognition Arousal/Alertness: Awake/alert Behavior During Therapy: WFL for tasks assessed/performed Overall Cognitive Status: Within Functional Limits for tasks assessed                                        General Comments      Exercises     Assessment/Plan    PT Assessment Patent does not need any further PT services  PT Problem List         PT Treatment Interventions      PT Goals (Current goals can be found in the Care Plan section)  Acute Rehab PT Goals PT Goal Formulation: All assessment and education complete, DC therapy    Frequency     Barriers to discharge        Co-evaluation               AM-PAC PT "6  Clicks" Mobility  Outcome Measure Help needed turning from your back to your side while in a flat bed without using bedrails?: None Help needed moving from lying on your back to sitting on the side of a flat bed without using bedrails?: None Help needed moving to and from a bed to a chair (including a wheelchair)?: None Help needed standing up from a chair using your arms (e.g., wheelchair or bedside chair)?: None Help needed to walk in hospital room?: None Help needed climbing 3-5 steps with a railing? : A Little 6 Click Score: 23    End of Session   Activity Tolerance: Patient tolerated treatment well Patient left: in bed;with call bell/phone within reach Nurse Communication: Mobility status PT Visit Diagnosis: Other abnormalities of gait and mobility (R26.89)    Time: 7673-4193 PT Time Calculation (min) (ACUTE ONLY): 13 min   Charges:   PT Evaluation $PT Eval Low Complexity: 1  Low          [place] Pager [phone] [place] 09/14/2019, 4:23 PM

## 2019-09-15 ENCOUNTER — Ambulatory Visit (INDEPENDENT_AMBULATORY_CARE_PROVIDER_SITE_OTHER): Payer: Medicare HMO | Admitting: *Deleted

## 2019-09-15 ENCOUNTER — Telehealth: Payer: Self-pay | Admitting: Physician Assistant

## 2019-09-15 ENCOUNTER — Telehealth: Payer: Self-pay | Admitting: Cardiology

## 2019-09-15 DIAGNOSIS — I10 Essential (primary) hypertension: Secondary | ICD-10-CM

## 2019-09-15 DIAGNOSIS — Z23 Encounter for immunization: Secondary | ICD-10-CM | POA: Diagnosis not present

## 2019-09-15 DIAGNOSIS — I495 Sick sinus syndrome: Secondary | ICD-10-CM

## 2019-09-15 DIAGNOSIS — Z950 Presence of cardiac pacemaker: Secondary | ICD-10-CM

## 2019-09-15 DIAGNOSIS — I480 Paroxysmal atrial fibrillation: Secondary | ICD-10-CM

## 2019-09-15 LAB — GLUCOSE, CAPILLARY
Glucose-Capillary: 109 mg/dL — ABNORMAL HIGH (ref 70–99)
Glucose-Capillary: 169 mg/dL — ABNORMAL HIGH (ref 70–99)

## 2019-09-15 MED ORDER — HYDRALAZINE HCL 10 MG PO TABS: 10.0000 mg | ORAL_TABLET | ORAL | 1 refills | Status: AC | PRN

## 2019-09-15 MED ORDER — CARVEDILOL 25 MG PO TABS: 25.0000 mg | ORAL_TABLET | Freq: Two times a day (BID) | ORAL | Status: DC

## 2019-09-15 MED ORDER — CARVEDILOL 25 MG PO TABS: 25.0000 mg | ORAL_TABLET | Freq: Two times a day (BID) | ORAL | 1 refills | Status: DC

## 2019-09-15 MED ORDER — FLEET ENEMA 7-19 GM/118ML RE ENEM
1.0000 | ENEMA | Freq: Once | RECTAL | Status: AC
  Administered 2019-09-15: 09:00:00 1 via RECTAL
  Filled 2019-09-15: qty 1

## 2019-09-15 NOTE — Progress Notes (Addendum)
Progress Note  Patient Name: Laurie [name] Date of Encounter: 09/15/2019  Primary Cardiologist: [name], MD   Subjective   O/N events: Bp better controlled overnight w/ consistent 135/74.  Mrs.Laurie [name] was examined and evaluated at bedside this AM. She mentions having significant abdominal discomfort due to constipation. Discussed improved bp control overnight and likely discharge later today after bowel movement. Denies any chest pain, palpitations, dyspnea.  Inpatient Medications    Scheduled Meds: . amiodarone  200 mg Oral Daily  . carvedilol  12.5 mg Oral BID WC  . Chlorhexidine Gluconate Cloth  6 each Topical Daily  . lisinopril  5 mg Oral Daily  . pantoprazole  40 mg Oral Daily  . sodium chloride flush  3 mL Intravenous Q12H  . sodium phosphate  1 enema Rectal Once   Continuous Infusions: . sodium chloride Stopped (09/12/19 1133)   PRN Meds: acetaminophen **OR** acetaminophen, albuterol, ALPRAZolam, hydrALAZINE, HYDROcodone-acetaminophen, influenza vaccine adjuvanted, magnesium hydroxide, ondansetron **OR** ondansetron (ZOFRAN) IV, senna-docusate, traMADol   Vital Signs    Vitals:   09/14/19 1937 09/14/19 2256 09/15/19 0329 09/15/19 0827  BP: 134/72 (!) 142/76 135/74 (!) 161/85  Pulse: 67 (!) 59 65 61  Resp: (!) 25 17 18 19   Temp: 97.7 F (36.5 C) 97.9 F (36.6 C) (!) 97.4 F (36.3 C) 98 F (36.7 C)  TempSrc: Oral Oral Oral Oral  SpO2: 96% 95% 94% 93%  Weight:   73.6 kg   Height:        Intake/Output Summary (Last 24 hours) at 09/15/2019 0841 Last data filed at 09/15/2019 0329 Gross per 24 hour  Intake 360 ml  Output -  Net 360 ml   Filed Weights   09/13/19 0448 09/14/19 0500 09/15/19 0329  Weight: 75.9 kg 73.7 kg 73.6 kg    Telemetry    Normal sinus, HR 60-80 - Personally Reviewed  ECG    Normal sinus, normal axis, Q waves in Septal Leads, mild ST depression in lateral leads consistent with prior EKGs. - Personally Reviewed   Physical Exam   Gen: Well-developed, well nourished, NAD HEENT: NCAT head, poor hearing, MMM Neck: supple, ROM intact, no JVD CV: RRR, S1, S2 normal, 3/6 holosystolic murmur Pulm: CTAB, No rales, no wheezes Abd: Soft, BS+, non-tender Extm: ROM intact, Peripheral pulses intact, No peripheral edema Skin: Dry, Warm, normal turgor  Labs    Chemistry Recent Labs  Lab 09/12/19 0228 09/13/19 0959 09/14/19 0400  NA 125* 126* 129*  K 4.1 4.0 3.9  CL 89* 91* 95*  CO2 25 25 24   GLUCOSE 90 132* 102*  BUN 6* 8 7*  CREATININE 0.83 0.82 0.82  CALCIUM 8.8* 8.9 8.7*  GFRNONAA >60 >60 >60  GFRAA >60 >60 >60  ANIONGAP 11 10 10      Hematology Recent Labs  Lab 09/11/19 1536 09/12/19 0228 09/13/19 0959  WBC 7.4 6.9 7.1  RBC 4.77 4.17 4.22  HGB 14.1 12.9 13.0  HCT 43.0 36.6 37.3  MCV 90.1 87.8 88.4  MCH 29.6 30.9 30.8  MCHC 32.8 35.2 34.9  RDW 13.2 13.3 13.3  PLT 252 209 186    Cardiac EnzymesNo results for input(s): TROPONINI in the last 168 hours. No results for input(s): TROPIPOC in the last 168 hours.   BNPNo results for input(s): BNP, PROBNP in the last 168 hours.   DDimer No results for input(s): DDIMER in the last 168 hours.   Radiology    No results found.  Cardiac Studies  04/19/2019-echocardiogram  1. The left ventricle appears hypertrophied with hyperdynamic systolic function, with an ejection fraction of >65%. The cavity size was normal. 2. The right ventricle has normal systolic function. The cavity was normal. There is no increase in right ventricular wall thickness. 3. The bioprosthetic AVR is poorly imaged, doppler parameters are normal  Patient Profile     84 y.o. female w/ PMH of A.fib s/p pacemaker placement, CAD, aortic stenosis s/p aortic valve replacement, PAD, HTN, HLD, and lung ca s/p lobectomy admit for aortic dissection. Cardiology consulted for BP management.  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 Could have stricter control considering aneurysm. HR 65 - Increase carvedilol to 25mg  BID - C/w lisinopril  - Hydrazine PRN for systolic bp >960  Constipation Planned for enema per CT surg  A.fib s/p pacemaker Currently in sinus. Pacemaker data reviewed yesterday w/ no abnormalities. CHADS-VASC score of 6 (age, F, HTn, CAD) but Xarelto stopped due to risk of bleed - C/w amiodarone - Telemetry   For questions or updates, please contact [place] Please consult [email or web address] for contact info under Cardiology/STEMI.   Signed, Gilberto Better, MD PGY-2, [place] [facility] Pager: [phone] 09/15/2019, 8:41 AM    Attending attestation to follow  I have seen and examined the patient along with Gilberto Better, MD.  I have reviewed the chart, notes and new data.  I agree with his note.  Key new complaints: No cardiovascular complaints, still has problems with constipation. Key examination changes: No evidence of dissection progression to branch vessels by physical exam blood pressure control adequate until this morning when her systolic is a little high. Key new findings / data: n/a  PLAN: Increase carvedilol to 25 mg twice daily.  If she becomes hypotensive plan to discontinue lisinopril.  We will make arrangements for a transition of care follow-up visit in 1-2 weeks and a follow-up visit with Dr. Stanford Breed in 1 month.  Sanda Klein, MD, [place] [phone] 09/15/2019, 9:21 AM

## 2019-09-15 NOTE — Telephone Encounter (Signed)
   TELEPHONE CALL NOTE  This patient has been deemed a candidate for follow-up tele-health visit to limit community exposure during the Covid-19 pandemic. I spoke with the patient via phone to discuss instructions. This has been outlined on the patient's AVS (dotphrase: [email or web address]). The patient was advised to review the section on consent for treatment as well. The patient will receive a phone call 2-3 days prior to their E-Visit at which time consent will be verbally confirmed.   A Virtual Office Visit appointment type has been scheduled for hospital  with Kerin Ransom, [facility] with "VIDEO" or "TELEPHONE" in the appointment notes - patient prefers VIDEO  type.  I have either confirmed the patient is active in MyChart or offered to send sign-up link to phone/email via Mychart icon beside patient's photo.  [place], [place] 09/15/2019 9:36 AM

## 2019-09-15 NOTE — TOC Transition Note (Signed)
Transition of Care [facility]) - CM/SW Discharge Note Marvetta Gibbons RN, BSN Transitions of Care Unit 4E- RN Case Manager [phone]   Patient Details  Name: Laurie [name] MRN: [id number] Date of Birth: 1935-02-20  [facility]) CM/SW Contact:  Dawayne Vernia, RN Phone Number: 09/15/2019, 3:00 PM   Clinical Narrative:    Pt stable for transition home today, referral received for outpt PC services- CM spoke with pt at bedside for choice- per pt she would like this writer to speak with her son. Pt reports she has 2 walkers at home and no further DME needs at this time. She states her son to some transport home. Call made to Madelaine Bhat to discuss PC needs- choice offered for outpt PC needs- per pt's [employer] benefits she has benefits for [facility] f/u with [facility] provided to son- and he would like referral made to [place] for outpt PC needs. Call made to  Cheri with [facility] for outpt [facility] referral- she will f/u with family for community f/u.    Final next level of care: Home/Self Care Barriers to Discharge: No Barriers Identified   Patient Goals and CMS Choice Patient states their goals for this hospitalization and ongoing recovery are:: return home   Choice offered to / list presented to : NA  Discharge Placement             Home with outpt PC follow up          Discharge Plan and Services   Discharge Planning Services: CM Consult Post Acute Care Choice: NA          DME Arranged: N/A DME Agency: NA       HH Arranged: NA HH Agency: NA        Social Determinants of Health (SDOH) Interventions     Readmission Risk Interventions Readmission Risk Prevention Plan 09/15/2019  Transportation Screening Complete  PCP or Specialist Appt within 5-7 Days Complete  Home Care Screening Complete  Medication Review (RN CM) Complete  Some recent data might be hidden

## 2019-09-15 NOTE — Progress Notes (Addendum)
      [address]       [name] [address]             [phone]         Subjective: BP control is improving Only c/o is constipation  Objective: Vital signs in last 24 hours: Temp:  [97.4 F (36.3 C)-98.1 F (36.7 C)] 97.4 F (36.3 C) (10/16 0329) Pulse Rate:  [59-67] 65 (10/16 0329) Cardiac Rhythm: Atrial paced (10/16 0329) Resp:  [15-25] 18 (10/16 0329) BP: (130-144)/(58-76) 135/74 (10/16 0329) SpO2:  [94 %-96 %] 94 % (10/16 0329) Weight:  [73.6 kg] 73.6 kg (10/16 0329)  Hemodynamic parameters for last 24 hours:    Intake/Output from previous day: 10/15 0701 - 10/16 0700 In: 360 [P.O.:360] Out: -  Intake/Output this shift: No intake/output data recorded.  General appearance: alert, cooperative and no distress Heart: regular rate and rhythm Lungs: + ronchi Abdomen: mild RUQ tenderness, soft, non-distended, + BS Extremities: no edema  Lab Results: Recent Labs    09/13/19 0959  WBC 7.1  HGB 13.0  HCT 37.3  PLT 186   BMET:  Recent Labs    09/13/19 0959 09/14/19 0400  NA 126* 129*  K 4.0 3.9  CL 91* 95*  CO2 25 24  GLUCOSE 132* 102*  BUN 8 7*  CREATININE 0.82 0.82  CALCIUM 8.9 8.7*    PT/INR: No results for input(s): LABPROT, INR in the last 72 hours. ABG    Component Value Date/Time   PHART 7.310 (L) 08/28/2008 1811   HCO3 23.4 08/28/2008 1811   TCO2 24 08/29/2008 1700   ACIDBASEDEF 3.0 (H) 08/28/2008 1811   O2SAT 98.0 08/28/2008 1811   CBG (last 3)  Recent Labs    09/14/19 1658 09/14/19 2058 09/15/19 0601  GLUCAP 118* 136* 109*    Meds Scheduled Meds: . amiodarone  200 mg Oral Daily  . carvedilol  12.5 mg Oral BID WC  . Chlorhexidine Gluconate Cloth  6 each Topical Daily  . lisinopril  5 mg Oral Daily  . pantoprazole  40 mg Oral Daily  . sodium chloride flush  3 mL Intravenous Q12H   Continuous Infusions: . sodium chloride Stopped (09/12/19 1133)   PRN Meds:.acetaminophen **OR** acetaminophen,  albuterol, ALPRAZolam, hydrALAZINE, HYDROcodone-acetaminophen, influenza vaccine adjuvanted, magnesium hydroxide, ondansetron **OR** ondansetron (ZOFRAN) IV, senna-docusate, traMADol  Xrays No results found.  Assessment/Plan:   1 BP improving control, cont adjustments as per cardiology 2 will give enema today, cont other meds for constipation 3 poss home later today    LOS: 4 days    John Giovanni PA-C 09/15/2019 Pager [phone]   I have seen and examined the patient and agree with the assessment and plan as outlined.  BP under better control on beta blocker.  D/C home today once patient has had a bowel movement.  Rexene Alberts, MD 09/15/2019 8:32 AM

## 2019-09-15 NOTE — Telephone Encounter (Signed)
Patient currently admitted

## 2019-09-15 NOTE — Discharge Instructions (Signed)
YOUR CARDIOLOGY TEAM HAS ARRANGED FOR AN E-VISIT FOR YOUR APPOINTMENT - PLEASE REVIEW IMPORTANT INFORMATION BELOW SEVERAL DAYS PRIOR TO YOUR APPOINTMENT  Due to the recent COVID-19 pandemic, we are transitioning in-person office visits to tele-medicine visits in an effort to decrease unnecessary exposure to our patients, their families, and staff. These visits are billed to your insurance just like a normal visit is. We also encourage you to sign up for MyChart if you have not already done so. You will need a smartphone if possible. For patients that do not have this, we can still complete the visit using a regular telephone but do prefer a smartphone to enable video when possible. You may have a family member that lives with you that can help. If possible, we also ask that you have a blood pressure cuff and scale at home to measure your blood pressure, heart rate and weight prior to your scheduled appointment. Patients with clinical needs that need an in-person evaluation and testing will still be able to come to the office if absolutely necessary. If you have any questions, feel free to call our office.   IF USING DOXIMITY or [email or web address] - The staff will give you instructions on receiving your link to join the meeting the day of your visit.      2-3 DAYS BEFORE YOUR APPOINTMENT  You will receive a telephone call from one of our [facility] team members - your caller ID may say "Unknown caller." If this is a video visit, we will walk you through how to get the video launched on your phone. We will remind you check your blood pressure, heart rate and weight prior to your scheduled appointment. If you have an Apple Watch or Kardia, please upload any pertinent ECG strips the day before or morning of your appointment to [facility]. Our staff will also make sure you have reviewed the consent and agree to move forward with your scheduled tele-health visit.     THE DAY OF YOUR APPOINTMENT  Approximately 15  minutes prior to your scheduled appointment, you will receive a telephone call from one of [facility] team - your caller ID may say "Unknown caller."  Our staff will confirm medications, vital signs for the day and any symptoms you may be experiencing. Please have this information available prior to the time of visit start. It may also be helpful for you to have a pad of paper and pen handy for any instructions given during your visit. They will also walk you through joining the smartphone meeting if this is a video visit.  CONSENT FOR TELE-HEALTH VISIT - PLEASE REVIEW  I hereby voluntarily request, consent and authorize [facility] and its employed or contracted physicians, physician assistants, nurse practitioners or other licensed health care professionals (the Practitioner), to provide me with telemedicine health care services (the Services") as deemed necessary by the treating Practitioner. I acknowledge and consent to receive the Services by the Practitioner via telemedicine. I understand that the telemedicine visit will involve communicating with the Practitioner through live audiovisual communication technology and the disclosure of certain medical information by electronic transmission. I acknowledge that I have been given the opportunity to request an in-person assessment or other available alternative prior to the telemedicine visit and am voluntarily participating in the telemedicine visit.  I understand that I have the right to withhold or withdraw my consent to the use of telemedicine in the course of my care at any time, without affecting my right to future care  or treatment, and that the Practitioner or I may terminate the telemedicine visit at any time. I understand that I have the right to inspect all information obtained and/or recorded in the course of the telemedicine visit and may receive copies of available information for a reasonable fee.  I understand that some of the potential  risks of receiving the Services via telemedicine include:   Delay or interruption in medical evaluation due to technological equipment failure or disruption;  Information transmitted may not be sufficient (e.g. poor resolution of images) to allow for appropriate medical decision making by the Practitioner; and/or   In rare instances, security protocols could fail, causing a breach of personal health information.  Furthermore, I acknowledge that it is my responsibility to provide information about my medical history, conditions and care that is complete and accurate to the best of my ability. I acknowledge that Practitioner's advice, recommendations, and/or decision may be based on factors not within their control, such as incomplete or inaccurate data provided by me or distortions of diagnostic images or specimens that may result from electronic transmissions. I understand that the practice of medicine is not an exact science and that Practitioner makes no warranties or guarantees regarding treatment outcomes. I acknowledge that I will receive a copy of this consent concurrently upon execution via email to the email address I last provided but may also request a printed copy by calling the office of [facility].    I understand that my insurance will be billed for this visit.   I have read or had this consent read to me.  I understand the contents of this consent, which adequately explains the benefits and risks of the Services being provided via telemedicine.   I have been provided ample opportunity to ask questions regarding this consent and the Services and have had my questions answered to my satisfaction.  I give my informed consent for the services to be provided through the use of telemedicine in my medical care  By participating in this telemedicine visit I agree to the above.    Aortic Dissection  Aortic dissection happens when there is a tear in the wall of the body's main  blood vessel (aorta). The aorta leads out of the heart (ascending aorta), curves around, and then goes down the chest (descending aorta) and into the abdomen to supply arteries with blood. The wall of the aorta has inner and outer layers. As blood collects along the tear, one part of the aorta continues to carry blood to the body, but blood can also flow into the tear between the layers of the aorta. The torn part of the aorta fills with blood and swells. This can reduce blood flow through the part of the aorta that is still supplying blood to the body. Aortic dissection is a medical emergency. What are the causes? This condition is commonly caused by weakening of the artery wall due to high blood pressure. Other causes may include:  An injury, such as from a car crash.  A complication from heart surgery or from a diagnostic procedure called coronary catheterization.  Weakness of the artery wall due to birth defects that affect the connective tissues, such as Marfan syndrome. In some cases, the cause is not known. What increases the risk? The following factors may make you more likely to develop this condition:  Having certain medical conditions, such as: ? High blood pressure (hypertension). ? Hardening and narrowing of the arteries (atherosclerosis). ? A condition that  causes inflammation of blood vessels, such as giant cell arteritis.  Having two cusps in the aortic valve instead of three (bicuspid aortic valve).  Having a bulge in the wall of the aorta (aortic aneurysm).  Being female.  Being pregnant.  Being older than age 82.  Using cocaine.  Smoking.  Lifting heavy weights or doing other types of strength training (high-intensity resistance training). What are the signs or symptoms? Signs and symptoms of aortic dissection start suddenly. The most common symptoms are:  Severe chest pain that may feel like tearing, stabbing, or sharp pain.  Severe pain that spreads  (radiates) to the back, neck, jaw, or abdomen.  Severe pain between the shoulder blades in the back. Other symptoms may include:  Trouble breathing.  Dizziness or fainting.  Sudden weakness on one side of the body.  Nausea or vomiting.  Trouble swallowing.  Coughing up blood.  Vomiting blood.  Clammy skin. How is this diagnosed? This condition may be diagnosed based on:  Your symptoms and a physical exam. This may include: ? Listening for abnormal blood flow sounds (murmurs) in your chest or abdomen. ? Checking your pulse in your arms and legs. ? Checking your blood pressure to see whether it is low, or whether there is a difference between the measurements (readings) from your right arm and left arm.  Electrocardiogram (ECG). This test measures the electrical activity in your heart.  Chest X-ray.  CT scan.  MRI.  Echocardiogram. This uses sound waves to make images of your heart.  Blood tests. How is this treated? It is important to treat aortic dissection as quickly as possible. Treatment may start as soon as your health care provider thinks that you have aortic dissection. Treatment depends on where the dissection is, how severe it is, and your overall health. Treatment may include:  Medicines to lower your heart rate and blood pressure.  Surgery to repair your aorta using artificial material (syntheticgraft).  A procedure to insert a stent-graft into the aorta (endovascular procedure). During this procedure: 1. A long, thin tube (stent) is inserted into an artery near the groin (femoral artery). 2. The stent is moved up to the damaged part of the aorta. 3. The stent is opened to help improve blood flow and prevent future dissection. Your health care provider may refer you to a specialized treatment center. Follow these instructions at home: If you had surgery, follow instructions from your health care provider about home care after the  procedure. Activity  Do not lift anything that is heavier than 10 lb (4.5 kg), or the limit that you are told, until your health care provider says that it is safe.  Avoid activities that could injure your chest or abdomen. Ask your health care provider what activities are safe for you.  After you have recovered, try to stay active. Ask your health care provider what activities are safe for you after recovery.  Enroll in cardiac rehabilitation. This is a program that helps to improve your health and well-being. It includes exercise training, education, and counseling to help you recover. Lifestyle      Eat a heart-healthy diet, which includes lots of fresh fruits and vegetables, low-fat (lean) protein, and whole grains.  Work with your health care provider to treat any other conditions that you may have, such as obesity, high blood pressure, or diabetes.  Do not use any products that contain nicotine or tobacco, such as cigarettes, e-cigarettes, and chewing tobacco. If you need help  quitting, ask your health care provider. General instructions  Take over-the-counter and prescription medicines only as told by your health care provider.  Talk with your health care provider about how to manage stress.  Keep all follow-up visits as told by your health care provider. This is important. Get help right away if you:  Develop any symptoms of aortic dissection after treatment, including severe pain in your chest, back, or abdomen.  Have pain in your chest.  Have weakness in your arm or leg.  Have pain in your abdomen.  Have trouble breathing or you develop a cough.  Faint.  Develop a racing heartbeat (palpitations). These symptoms may represent a serious problem that is an emergency. Do not wait to see if the symptoms will go away. Get medical help right away. Call your local emergency services (911 in the [place] Do not drive yourself to the hospital. Summary  Aortic dissection  happens when there is a tear in the wall of the body's main blood vessel (aorta). It is a medical emergency.  The most common symptom is severe pain in the chest or pain that spreads (radiates) to the back, neck, jaw, or abdomen.  It is important to treat aortic dissection as quickly as possible. Treatment usually includes medicines and surgery.  Take over-the-counter and prescription medicines only as told by your health care provider. This information is not intended to replace advice given to you by your health care provider. Make sure you discuss any questions you have with your health care provider. Document Released: 02/23/2008 Document Revised: 05/25/2018 Document Reviewed: 04/28/2018 Elsevier Patient Education  2020 [employer]. Hypertension, Adult Hypertension is another name for high blood pressure. High blood pressure forces your heart to work harder to pump blood. This can cause problems over time. There are two numbers in a blood pressure reading. There is a top number (systolic) over a bottom number (diastolic). It is best to have a blood pressure that is below 120/80. Healthy choices can help lower your blood pressure, or you may need medicine to help lower it. What are the causes? The cause of this condition is not known. Some conditions may be related to high blood pressure. What increases the risk?  Smoking.  Having type 2 diabetes mellitus, high cholesterol, or both.  Not getting enough exercise or physical activity.  Being overweight.  Having too much fat, sugar, calories, or salt (sodium) in your diet.  Drinking too much alcohol.  Having long-term (chronic) kidney disease.  Having a family history of high blood pressure.  Age. Risk increases with age.  Race. You may be at higher risk if you are African American.  Gender. Men are at higher risk than women before age 9. After age 49, women are at higher risk than men.  Having obstructive sleep  apnea.  Stress. What are the signs or symptoms?  High blood pressure may not cause symptoms. Very high blood pressure (hypertensive crisis) may cause: ? Headache. ? Feelings of worry or nervousness (anxiety). ? Shortness of breath. ? Nosebleed. ? A feeling of being sick to your stomach (nausea). ? Throwing up (vomiting). ? Changes in how you see. ? Very bad chest pain. ? Seizures. How is this treated?  This condition is treated by making healthy lifestyle changes, such as: ? Eating healthy foods. ? Exercising more. ? Drinking less alcohol.  Your health care provider may prescribe medicine if lifestyle changes are not enough to get your blood pressure under control, and if: ?  Your top number is above 130. ? Your bottom number is above 80.  Your personal target blood pressure may vary. Follow these instructions at home: Eating and drinking   If told, follow the DASH eating plan. To follow this plan: ? Fill one half of your plate at each meal with fruits and vegetables. ? Fill one fourth of your plate at each meal with whole grains. Whole grains include whole-wheat pasta, brown rice, and whole-grain bread. ? Eat or drink low-fat dairy products, such as skim milk or low-fat yogurt. ? Fill one fourth of your plate at each meal with low-fat (lean) proteins. Low-fat proteins include fish, chicken without skin, eggs, beans, and tofu. ? Avoid fatty meat, cured and processed meat, or chicken with skin. ? Avoid pre-made or processed food.  Eat less than 1,500 mg of salt each day.  Do not drink alcohol if: ? Your doctor tells you not to drink. ? You are pregnant, may be pregnant, or are planning to become pregnant.  If you drink alcohol: ? Limit how much you use to:  0-1 drink a day for women.  0-2 drinks a day for men. ? Be aware of how much alcohol is in your drink. In the [place] one drink equals one 12 oz bottle of beer (355 mL), one 5 oz glass of wine (148 mL), or one 1 oz  glass of hard liquor (44 mL). Lifestyle   Work with your doctor to stay at a healthy weight or to lose weight. Ask your doctor what the best weight is for you.  Get at least 30 minutes of exercise most days of the week. This may include walking, swimming, or biking.  Get at least 30 minutes of exercise that strengthens your muscles (resistance exercise) at least 3 days a week. This may include lifting weights or doing Pilates.  Do not use any products that contain nicotine or tobacco, such as cigarettes, e-cigarettes, and chewing tobacco. If you need help quitting, ask your doctor.  Check your blood pressure at home as told by your doctor.  Keep all follow-up visits as told by your doctor. This is important. Medicines  Take over-the-counter and prescription medicines only as told by your doctor. Follow directions carefully.  Do not skip doses of blood pressure medicine. The medicine does not work as well if you skip doses. Skipping doses also puts you at risk for problems.  Ask your doctor about side effects or reactions to medicines that you should watch for. Contact a doctor if you:  Think you are having a reaction to the medicine you are taking.  Have headaches that keep coming back (recurring).  Feel dizzy.  Have swelling in your ankles.  Have trouble with your vision. Get help right away if you:  Get a very bad headache.  Start to feel mixed up (confused).  Feel weak or numb.  Feel faint.  Have very bad pain in your: ? Chest. ? Belly (abdomen).  Throw up more than once.  Have trouble breathing. Summary  Hypertension is another name for high blood pressure.  High blood pressure forces your heart to work harder to pump blood.  For most people, a normal blood pressure is less than 120/80.  Making healthy choices can help lower blood pressure. If your blood pressure does not get lower with healthy choices, you may need to take medicine. This information is  not intended to replace advice given to you by your health care provider. Make sure  you discuss any questions you have with your health care provider. Document Released: 05/04/2008 Document Revised: 07/27/2018 Document Reviewed: 07/27/2018 Elsevier Patient Education  2020 [employer].

## 2019-09-15 NOTE — Progress Notes (Signed)
CARDIAC REHAB PHASE I   Offered to walk with pt, pt declining at this time. Pt had just used the BR and is tired. Pt with plans for d/c later today. Pt denies questions or concerns. Pt upset with not being able to drive and new restrictions as she was independent. Encouraged continued ambulation as able with emphasis on safety.  [id number] Rufina Falco, RN BSN 09/15/2019 11:04 AM

## 2019-09-15 NOTE — Progress Notes (Signed)
Rechecked bp at bedside after morning anti-hypertensive doses given. BP 120/62. Safe for discharge.

## 2019-09-15 NOTE — Progress Notes (Signed)
Patient in a stable condition, discharge education reviewed with patient and her daughter they verbalized understanding, iv removed, tele dc ccmd notified, patient belongings at bedside, patient to be transported home by her daughter.

## 2019-09-15 NOTE — Consult Note (Signed)
   [facility] CM Inpatient Consult   09/15/2019  Laurie [name] 04/30/1935 [id number]  Thank you for the referral. [employer] member.  We have reviewed your referral request and is in the [facility] network and can receive care management under [facility] services . Chart review reveals patient has had a Palliative consult.  Currently, inpatient [facility] RNCM assessed patient/family would like to explore options for [facility] for home palliative program.  For further questions, please contact:  Natividad Brood, RN BSN [facility] Liaison  [phone] business mobile phone Toll free office [phone]  Fax number: [phone] [email or web address]

## 2019-09-15 NOTE — Telephone Encounter (Signed)
New Message  Per B Bhagat PA scheduled virtual TOC visit with Kerin Ransom PA on 09/25/19 at 8:30 am.

## 2019-09-18 ENCOUNTER — Other Ambulatory Visit: Payer: Self-pay

## 2019-09-18 ENCOUNTER — Telehealth: Payer: Self-pay

## 2019-09-18 LAB — CUP PACEART REMOTE DEVICE CHECK
Battery Remaining Longevity: 120 mo
Battery Remaining Percentage: 95.5 %
Battery Voltage: 2.99 V
Brady Statistic AP VP Percent: 1 %
Brady Statistic AP VS Percent: 51 %
Brady Statistic AS VP Percent: 1 %
Brady Statistic AS VS Percent: 49 %
Brady Statistic RA Percent Paced: 49 %
Brady Statistic RV Percent Paced: 1 %
Date Time Interrogation Session: 20201016220020
Implantable Lead Implant Date: 20200106
Implantable Lead Implant Date: 20200106
Implantable Lead Location: 753859
Implantable Lead Location: 753860
Implantable Pulse Generator Implant Date: 20200106
Lead Channel Impedance Value: 490 Ohm
Lead Channel Impedance Value: 590 Ohm
Lead Channel Pacing Threshold Amplitude: 0.625 V
Lead Channel Pacing Threshold Amplitude: 0.75 V
Lead Channel Pacing Threshold Pulse Width: 0.5 ms
Lead Channel Pacing Threshold Pulse Width: 0.5 ms
Lead Channel Sensing Intrinsic Amplitude: 12 mV
Lead Channel Sensing Intrinsic Amplitude: 4.3 mV
Lead Channel Setting Pacing Amplitude: 0.875
Lead Channel Setting Pacing Amplitude: 2.5 V
Lead Channel Setting Pacing Pulse Width: 0.5 ms
Lead Channel Setting Sensing Sensitivity: 2 mV
Pulse Gen Model: 2272
Pulse Gen Serial Number: 9089721

## 2019-09-18 NOTE — Telephone Encounter (Signed)
[facility] would like to go out and do an evaluation on patient.

## 2019-09-18 NOTE — Patient Outreach (Signed)
[facility]) Care Management  09/18/2019  Laurie [name] 05-Apr-1935 [id number]   Received referral for TOC.  According to note patient discharged 09-15-2019 and patient referred to [facility].  Telephone call to [facility].  Spoke with Di Kindle.  She states they have the referral for [facility] but was not notified that patient was discharged.  Advised that patient was discharged on 09-15-2019.  She states they will contact PCP and connect with the family for visit.  She will let CM know of progress.    Telephone call to patient. She asked that CM call her daughter-in-law Thayer Headings.  Telephone call to Thayer Headings she states that someone from [facility]osp[facility] called her husband but no appointment has been set.  She vented on call that she really thinks patient needs hospice and her care was just a bit much.  Advised her that CM had contacted Hospice and they are working on referral and will be on contact.  She is appreciative of the information.   Shirline Frees that CM would be in contact later in the week to insure patient connected to either [facility] or Hosp[facility]. She is agreeable.  Telephone call to Dr. Gardiner Ramus office to notify of referral.  They will be on the look out for referral.  Plan: RN CM will outreach family later this week.      Jone Baseman, RN, MSN [facility] Care Management Coordinator Direct Line [phone] Cell [phone] Toll Free: [phone]  Fax: [phone]

## 2019-09-18 NOTE — Telephone Encounter (Signed)
Yes, okay for verbal orders for hospice.

## 2019-09-18 NOTE — Telephone Encounter (Signed)
Laurie [name] with [facility] called and left a message stating she would like verbal orders to start palliative care for Laurie [name].    [phone]

## 2019-09-18 NOTE — Telephone Encounter (Signed)
Attempted to contact patient regarding TOC appointment information.  LVM to call back to discuss.

## 2019-09-19 ENCOUNTER — Telehealth: Payer: Self-pay | Admitting: *Deleted

## 2019-09-19 NOTE — Telephone Encounter (Signed)
Spoke with pt, she is aware of the follow up in December and will keep that appointment.

## 2019-09-19 NOTE — Telephone Encounter (Signed)
Manus Gunning with hospice advised.

## 2019-09-19 NOTE — Telephone Encounter (Signed)
Ms. Laurie [name], s/p Thoracic Dissection with medical management   called with concerns regarding a low BP this morning of 89/33 P60.  She states that she is experiencing some dizziness at times, not just from a sitting position.  I said she should monitor her BP today and if this trend continues she should contact Dr. Stanford Breed for advance. I did tell her that Dr. Sallyanne Kuster stated that Lisionopril would be stopped if hypotension occurs.  She is not sure her BP cuff is accurate.  She will notify Dr. Jacalyn Lefevre office if dizziness persists.

## 2019-09-19 NOTE — Telephone Encounter (Signed)
Patient wants to cancel this appointment, she feels it is not a needed. I explained why it was important- but patient still insisted on having it canceled. Advised I would let Dr.Crenshaw know-

## 2019-09-19 NOTE — Telephone Encounter (Signed)
Arrange fuov when patient agreeable with me or pa [name]

## 2019-09-20 NOTE — Progress Notes (Signed)
Remote pacemaker transmission.   

## 2019-09-21 ENCOUNTER — Telehealth: Payer: Self-pay

## 2019-09-21 ENCOUNTER — Other Ambulatory Visit: Payer: Self-pay

## 2019-09-21 DIAGNOSIS — I718 Aortic aneurysm of unspecified site, ruptured: Secondary | ICD-10-CM

## 2019-09-21 MED ORDER — HYDROCODONE-ACETAMINOPHEN 10-325 MG PO TABS: 1.0000 | ORAL_TABLET | ORAL | 0 refills | Status: DC | PRN

## 2019-09-21 NOTE — Telephone Encounter (Signed)
Palliative care, Meda Klinefelter, RN, called and states Laurie [name] is in a lot of abdominal pain. The dilating thoracic aorta has increased in size. She is out of the Hydrocodone given by Dr Georgina Snell. While in the hospital she was on Vicodin every 4 hours and this did help with the pain. They are going to have the Palliative Care Provider visit with patient next week. The palliative care nurse wanted to know if Dr Madilyn Fireman could proved a prescription until the palliative care provider can do his evaluation.    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 The ascending thoracic aorta now measures 7-7.1 cm in greatest diameter.

## 2019-09-21 NOTE — Patient Outreach (Signed)
[facility] Care Management  09/21/2019  Laurie [name] 1935-07-16 [id number]   Telephone call to daughter in law [employer].  She states that the palliative nurse came out to see patient and [place] will be following patient for [facility] program at this time.  She did discuss hospice but right now will go with the palliative for now. She states that when patient needs more care they would have to place patient.  Advised that care connections will assist with that.  She is appreciative of follow up.    Telephone call to [facility]. She confirms that patient is enrolled with care connections as of today.    Plan: RN CM will close case.      Jone Baseman, RN, MSN [place] [facility] Care Management Coordinator Direct Line [phone] Cell [phone] Toll Free: [phone]  Fax: [phone]

## 2019-09-21 NOTE — Telephone Encounter (Signed)
Prescription sent

## 2019-09-22 NOTE — Telephone Encounter (Signed)
Patient is aware 

## 2019-09-25 ENCOUNTER — Telehealth: Payer: Medicare HMO | Admitting: Cardiology

## 2019-09-29 ENCOUNTER — Ambulatory Visit: Payer: Medicare HMO | Admitting: Cardiology

## 2019-10-03 ENCOUNTER — Other Ambulatory Visit: Payer: Self-pay | Admitting: Cardiology

## 2019-10-03 ENCOUNTER — Other Ambulatory Visit: Payer: Self-pay | Admitting: Family Medicine

## 2019-10-03 DIAGNOSIS — F411 Generalized anxiety disorder: Secondary | ICD-10-CM

## 2019-10-03 NOTE — Telephone Encounter (Signed)
[facility] requesting med refill for alprazolam.

## 2019-10-04 NOTE — Telephone Encounter (Signed)
Added codes.

## 2019-10-05 ENCOUNTER — Telehealth: Payer: Self-pay | Admitting: Cardiology

## 2019-10-05 NOTE — Telephone Encounter (Signed)
Tammy, calling from [facility].  She stated the patient had low BP today it was 92/62 sitting, when taken standing it was 80/52. Patient complained of feeling dizzy, and very weak. Patient was recently prescribed new medication when she was discharged of the hospital, carvedilol (COREG) 25 MG tablet she has been taking it at night only.  This morning she took it as well along with lisinopril (ZESTRIL) 5 MG tablet and amiodarone (PACERONE) 200 MG tablet , so the nurse is wondering if that is was caused her BP to drop so low.   Tammy can be reached at [phone].

## 2019-10-05 NOTE — Telephone Encounter (Signed)
Spoke with Tammy and pt had called feeling a little lightheaded so pt checked B/P and found B/p was 92/62 and rechecked after standing and was 80/52 Per Tammy instructed pt to try and drink extra fluids today and tomorrow and will recheck B/P again . Tammy is going to call pt back later and check Also suggested maybe eating a salty snack (crackers or chips) Will forward to Dr Stanford Breed for review and recommendations./cy

## 2019-10-06 NOTE — Telephone Encounter (Signed)
Decrease coreg to 12.5 mg BID and follow BP [name]

## 2019-10-06 NOTE — Telephone Encounter (Signed)
Spoke with pt, she is having a lot of stomach pain this morning. She is not lightheaded currently. She has not taken her medications or her blood pressure. She requested I call her back later today.

## 2019-10-09 ENCOUNTER — Telehealth: Payer: Self-pay

## 2019-10-09 NOTE — Telephone Encounter (Signed)
Palliative Medicine RN Note: Rec'd a call from pt's son [name]. They are trying to get patient enrolled in home hospice with [facility]. I spoke with Cheri w [facility], who will call [name]. [facility] will have to pursue a hospice order from PCP, as they have already been d/c from the hospital.   Marjie Skiff. Iriel Nason, RN, BSN, [facility] Palliative Medicine Team 10/09/2019 1:47 PM Office [phone]

## 2019-10-09 NOTE — Telephone Encounter (Signed)
Spoke with pt, she reports she has not been taking the carvedilol on a regular basis. She is not wanting to take it daily, she does not eat regular and it has to be taken with food. She reports if she takes it she will get sick to her stomach. She feels better if she does not take it. Will make sure with dr Stanford Breed for patient to remain off beta blocker.

## 2019-10-09 NOTE — Telephone Encounter (Signed)
Would prefer her to take at least 6.25 BID for aneurysm [name]

## 2019-10-09 NOTE — Telephone Encounter (Signed)
Spoke with pt, Aware of dr Jacalyn Lefevre recommendations. She prefers not to take the carvedilol at all now and is going to stop it.

## 2019-10-10 ENCOUNTER — Telehealth: Payer: Self-pay

## 2019-10-10 NOTE — Telephone Encounter (Signed)
Manus Gunning with Hospice called and left a message. She states Laurie [name] has been declining since the start of the palliative care. Her family is ok with a transition to hospice.   Manus Gunning wants to know if Dr Madilyn Fireman is ok for patient to transition to hospice care. Also if so, will Dr Madilyn Fireman continue to be the attending provider through hospice care.

## 2019-10-11 NOTE — Telephone Encounter (Signed)
Manus Gunning advised.

## 2019-10-11 NOTE — Telephone Encounter (Signed)
Please thank her for letting us know.  As also I will be happy to be the attending through hospice care.

## 2019-10-17 ENCOUNTER — Ambulatory Visit: Payer: Medicare HMO | Admitting: Cardiovascular Disease

## 2019-10-18 DIAGNOSIS — I7101 Dissection of thoracic aorta: Secondary | ICD-10-CM | POA: Diagnosis not present

## 2019-10-24 ENCOUNTER — Ambulatory Visit: Payer: Medicare HMO | Admitting: Cardiovascular Disease

## 2019-11-06 ENCOUNTER — Encounter: Payer: Medicare HMO | Admitting: Cardiology

## 2019-11-10 NOTE — Progress Notes (Signed)
Virtual Visit via Video Note changed to phone visit at patient request   This visit type was conducted due to national recommendations for restrictions regarding the COVID-19 Pandemic (e.g. social distancing) in an effort to limit this patient's exposure and mitigate transmission in our community.  Due to his co-morbid illnesses, this patient is at least at moderate risk for complications without adequate follow up.  This format is felt to be most appropriate for this patient at this time.  All issues noted in this document were discussed and addressed.  A limited physical exam was performed with this format.  Please refer to the patient's chart for his consent to telehealth for [facility].    HPI: FU aortic dissection, atrial fibrillation, CAD, ho aortic stenosis status post aortic valve replacement in September 2009. She had a pericardial tissue valve; also had a septal myectomy and a right middle lobectomy due to lung CA. Note, she had a 50% mid LAD by catheterization preoperatively. Carotid Dopplers November 2017 showed mild bilateral atherosclerosis and no hemodynamically significant stenosis. Dopplers 5/18: ABI of 0.57 on the right and 0.76 on the left. Waveforms were suggestive of inflow disease. Duplex showed significant distal aortic and bilateral common iliac artery stenosis with moderate left SFA stenosis.Seen by Dr Fletcher Anon and medical therapy recommended for now.Amiodarone added at previousov for recurrent atrial fibrillation.Patient was admitted January 2020 with recurrent atrial fibrillation and was noted to have posttermination pauses. Pacemaker was placed. Echocardiogram repeated May 2020 and showed vigorous LV function. Bioprosthetic aortic valve not well visualized but gradient is normal.  Abdominal CT September 2020 showed 3.3 x 4 cm infrarenal abdominal aortic aneurysm.  Chest CT October 2020 showed new type a thoracic aortic dissection in the ascending thoracic aorta now  measured 7 cm.  Patient was admitted and seen by cardiothoracic surgery.  It was felt that she would not be a good candidate for aortic root replacement and medical therapy recommended.  Xarelto was discontinued due to aneurysm/dissection.  Since I last saw her,she denies dyspnea, CP or syncope. BP labile in the evening per pt but improved with addition of PM lisinopril.  Current Outpatient Medications  Medication Sig Dispense Refill  . acetaminophen (TYLENOL) 500 MG tablet Take 2 tablets (1,000 mg total) by mouth every 8 (eight) hours as needed (pain). 30 tablet 0  . albuterol (PROVENTIL) (2.5 MG/3ML) 0.083% nebulizer solution Take 3 mLs (2.5 mg total) by nebulization every 4 (four) hours as needed for wheezing or shortness of breath. 30 vial 0  . ALPRAZolam (XANAX) 0.5 MG tablet Take 1 tablet (0.5 mg total) by mouth 2 (two) times daily as needed for anxiety. 60 tablet 0  . amiodarone (PACERONE) 200 MG tablet Take 1 tablet (200 mg total) by mouth daily. 90 tablet 3  . gabapentin (NEURONTIN) 100 MG capsule Take 1 capsule at bedtime for 3 days, then take 2 capsules at bedtime    . hydrALAZINE (APRESOLINE) 10 MG tablet Take 1 tablet (10 mg total) by mouth every 4 (four) hours as needed (SBP > 140 mmHg). 60 tablet 1  . HYDROcodone-acetaminophen (NORCO) 10-325 MG tablet Take 1 tablet by mouth every 4 (four) hours as needed. 180 tablet 0  . lisinopril (ZESTRIL) 5 MG tablet Take 1 tablet (5 mg total) by mouth daily. 90 tablet 3  . Oxycodone HCl 10 MG TABS Take 1 tablet by mouth every four hours  as needed, take 1 tab, if no relief in 1 hr take 2nd tablet  No current facility-administered medications for this visit.     Past Medical History:  Diagnosis Date  . AAA (abdominal aortic aneurysm) ([place])   . Anemia    iron deficiency  . Ascending aorta dilatation ([facility])   . Atrial fibrillation ([place])    post op  . CAD (coronary artery disease)   . Carcinoma of lung ([place]) 08/28/2008   T2N0 moderately  diff. squamous cell CA  . Cerebrovascular disease   . COPD (chronic obstructive pulmonary disease) ([place]) 2005   on CXR with R apical scaring  . Dissecting aneurysm of ascending aorta ([place]) 09/11/2019   Not a candidate for surgical intervention on medical treatment for BP control  . Headache(784.0)    Hx: of migraines in the past  . Hypercholesterolemia    Hx: of  . Hypertension   . Incidental pulmonary nodule, > 67mm and < 83mm    LLL  . S/P aortic valve replacement 08/28/2008   [facility]  . S/P lobectomy of lung 08/28/2008   rt middle lobe  dr. Roxy Manns  . Shingles 11-09  . Thoracic aortic dissection ([place]) 09/11/2019    Past Surgical History:  Procedure Laterality Date  . ABDOMINAL SURGERY    . AORTIC VALVE REPLACEMENT  08/28/2008   #53mm [facility] Ease pericardial tissue valve  . APPENDECTOMY    . CARDIAC CATHETERIZATION    . CATARACT EXTRACTION W/ INTRAOCULAR LENS  IMPLANT, BILATERAL Bilateral   . CHOLECYSTECTOMY N/A 12/13/2013   Procedure: LAPAROSCOPIC CHOLECYSTECTOMY;  Surgeon: Ralene Ok, MD;  Location: [place] [facility];  Service: General;  Laterality: N/A;  . COLONOSCOPY     \  . DILATION AND CURETTAGE OF UTERUS    . ERCP N/A 05/08/2015   Procedure: ENDOSCOPIC RETROGRADE CHOLANGIOPANCREATOGRAPHY (ERCP);  Surgeon: Inda Castle, MD;  Location: [place];  Service: Endoscopy;  Laterality: N/A;  . ERCP N/A 01/13/2016   Procedure: ENDOSCOPIC RETROGRADE CHOLANGIOPANCREATOGRAPHY (ERCP);  Surgeon: Doran Stabler, MD;  [facility];  Service: Endoscopy;  Laterality: N/A;  . ERCP N/A 03/09/2016   Procedure: ENDOSCOPIC RETROGRADE CHOLANGIOPANCREATOGRAPHY (ERCP);  Surgeon: Ladene Artist, MD;  Location[facility];  Service: Endoscopy;  Laterality: N/A;  . PACEMAKER IMPLANT N/A 12/05/2018   Procedure: PACEMAKER IMPLANT;  Surgeon: Evans Lance, MD;  Location: [facility];  Service: Cardiovascular;  Laterality: N/A;  . RML removed Dr Ricard Dillon for lung cancer  08/28/2008    T2N0 squamous cell CA  . TONSILLECTOMY AND ADENOIDECTOMY    . u/s guided aspiration of R breast cyst   2009   at the breast clinic    Social History   Socioeconomic History  . Marital status: Single    Spouse name: Not on file  . Number of children: Not on file  . Years of education: Not on file  . Highest education level: Not on file  Occupational History  . Not on file  Tobacco Use  . Smoking status: Former Smoker    Years: 40.00  . Smokeless tobacco: Never Used  Substance and Sexual Activity  . Alcohol use: No    Alcohol/week: 0.0 standard drinks  . Drug use: No  . Sexual activity: Not on file  Other Topics Concern  . Not on file  Social History Narrative  . Not on file   Social Determinants of Health   Financial Resource Strain:   . Difficulty of Paying Living Expenses: Not on file  Food Insecurity:   . Worried About [employer] of  Food in the Last Year: Not on file  . Ran Out of Food in the Last Year: Not on file  Transportation Needs:   . Lack of Transportation (Medical): Not on file  . Lack of Transportation (Non-Medical): Not on file  Physical Activity:   . Days of Exercise per Week: Not on file  . Minutes of Exercise per Session: Not on file  Stress:   . Feeling of Stress : Not on file  Social Connections:   . Frequency of Communication with Friends and Family: Not on file  . Frequency of Social Gatherings with Friends and Family: Not on file  . Attends Religious Services: Not on file  . Active Member of Clubs or Organizations: Not on file  . Attends Archivist Meetings: Not on file  . Marital Status: Not on file  Intimate Partner Violence:   . Fear of Current or Ex-Partner: Not on file  . Emotionally Abused: Not on file  . Physically Abused: Not on file  . Sexually Abused: Not on file    Family History  Problem Relation Age of Onset  . Stroke Father   . Diabetes Son        brittle diabetes  . Colon polyps Neg Hx   . Colon cancer Neg  Hx   . Esophageal cancer Neg Hx   . Stomach cancer Neg Hx   . Pancreatic cancer Neg Hx   . Kidney disease Neg Hx   . Liver disease Neg Hx     ROS: fatigue and decreased appetite but no fevers or chills, productive cough, hemoptysis, dysphasia, odynophagia, melena, hematochezia, dysuria, hematuria, rash, seizure activity, orthopnea, PND, pedal edema, claudication. Remaining systems are negative.  Physical Exam: Answers questions appropriately NAD Remainder of physical exam not performed due to telehealth visit and coronavirus pandemic  A/P  1 thoracic aortic aneurysm/type a aortic dissection-as outlined previously patient is not a candidate for aortic root replacement.  Therefore plan medical therapy.  Optimize blood pressure control.  2 paroxysmal atrial fibrillation-plan to continue amiodarone.  Xarelto has been discontinued in the setting of aortic aneurysm/dissection.  3 prior aortic valve replacement-continue SBE prophylaxis.  4 hypertension-blood pressure is adequately controlled.  Continue present medications and follow.  5 hyperlipidemia-continue diet.  Patient is intolerant to statins.  6 coronary artery disease-intolerant to statins.  Resume aspirin 81 mg daily.  7 thoracic, aortic and iliac aneurysms-we will not plan further imaging as she is not a candidate for intervention.  8 pacemaker-followed by Dr. Curt Bears.  COVID-19 Education: The importance of social distancing was discussed today.  Time:   Today, I have spent 16 minutes with the patient with telehealth technology discussing the above problems.     Medication Adjustments/Labs and Tests Ordered: Current medicines are reviewed at length with the patient today.  Concerns regarding medicines are outlined above.   Tests Ordered: No orders of the defined types were placed in this encounter.   Medication Changes: No orders of the defined types were placed in this encounter.   Follow Up:  Virtual in 3  months  Signed, [name], MD  11/13/2019 1:42 PM    [facility]

## 2019-11-13 ENCOUNTER — Telehealth: Payer: Self-pay | Admitting: Cardiology

## 2019-11-13 NOTE — Telephone Encounter (Signed)
Spoke with pt, the hospice folks have changed her dose of lisinopril and she needs a refill. Explained to the patient she will need to contact the hospice folks to get a refill. Also her appointment on Wednesday this week changed to virtual visit at her request.

## 2019-11-13 NOTE — Telephone Encounter (Signed)
Follow up     Pt is calling, she states she is on Hospice. She says she is needing medications refilled and the dosage needs to be changed  She is asking that Hilda Blades call her back    Please call

## 2019-11-13 NOTE — Telephone Encounter (Signed)
New Message    Pt says she is returning a call   Please call back

## 2019-11-15 ENCOUNTER — Encounter: Payer: Self-pay | Admitting: Cardiology

## 2019-11-15 ENCOUNTER — Other Ambulatory Visit: Payer: Self-pay

## 2019-11-15 ENCOUNTER — Telehealth (INDEPENDENT_AMBULATORY_CARE_PROVIDER_SITE_OTHER): Admitting: Cardiology

## 2019-11-15 VITALS — BP 128/68 | HR 62 | Ht 66.0 in | Wt 158.0 lb

## 2019-11-15 DIAGNOSIS — I7101 Dissection of thoracic aorta: Secondary | ICD-10-CM

## 2019-11-15 DIAGNOSIS — Z950 Presence of cardiac pacemaker: Secondary | ICD-10-CM | POA: Diagnosis not present

## 2019-11-15 DIAGNOSIS — E785 Hyperlipidemia, unspecified: Secondary | ICD-10-CM | POA: Diagnosis not present

## 2019-11-15 DIAGNOSIS — I71019 Dissection of thoracic aorta, unspecified: Secondary | ICD-10-CM

## 2019-11-15 DIAGNOSIS — Z7982 Long term (current) use of aspirin: Secondary | ICD-10-CM | POA: Diagnosis not present

## 2019-11-15 DIAGNOSIS — I2510 Atherosclerotic heart disease of native coronary artery without angina pectoris: Secondary | ICD-10-CM

## 2019-11-15 DIAGNOSIS — I480 Paroxysmal atrial fibrillation: Secondary | ICD-10-CM

## 2019-11-15 DIAGNOSIS — I10 Essential (primary) hypertension: Secondary | ICD-10-CM

## 2019-11-15 DIAGNOSIS — Z952 Presence of prosthetic heart valve: Secondary | ICD-10-CM

## 2019-11-15 DIAGNOSIS — I712 Thoracic aortic aneurysm, without rupture: Secondary | ICD-10-CM | POA: Diagnosis not present

## 2019-11-15 MED ORDER — ASPIRIN EC 81 MG PO TBEC: 81.0000 mg | DELAYED_RELEASE_TABLET | Freq: Every day | ORAL | 3 refills | Status: DC

## 2019-11-15 NOTE — Patient Instructions (Signed)
Medication Instructions:  START ASPIRIN 81 MG ONCE DAILY  *If you need a refill on your cardiac medications before your next appointment, please call your pharmacy*  Lab Work: If you have labs (blood work) drawn today and your tests are completely normal, you will receive your results only by: Marland Kitchen MyChart Message (if you have MyChart) OR . A paper copy in the mail If you have any lab test that is abnormal or we need to change your treatment, we will call you to review the results.  Follow-Up: At [facility], you and your health needs are our priority.  As part of our continuing mission to provide you with exceptional heart care, we have created designated Provider Care Teams.  These Care Teams include your primary Cardiologist (physician) and Advanced Practice Providers (APPs -  Physician Assistants and Nurse Practitioners) who all work together to provide you with the care you need, when you need it.  Your next appointment:   3 month(s)  The format for your next appointment:   Virtual Visit   Provider:   [name], MD

## 2019-11-16 ENCOUNTER — Other Ambulatory Visit: Payer: Self-pay | Admitting: Thoracic Surgery (Cardiothoracic Vascular Surgery)

## 2019-11-16 ENCOUNTER — Ambulatory Visit: Payer: Medicare HMO | Admitting: Cardiology

## 2019-11-16 DIAGNOSIS — I7101 Dissection of thoracic aorta: Secondary | ICD-10-CM

## 2019-11-16 DIAGNOSIS — I71019 Dissection of thoracic aorta, unspecified: Secondary | ICD-10-CM

## 2019-11-17 ENCOUNTER — Telehealth: Payer: Self-pay | Admitting: Cardiology

## 2019-11-17 ENCOUNTER — Telehealth: Payer: Self-pay | Admitting: *Deleted

## 2019-11-17 NOTE — Telephone Encounter (Signed)
Increase lisinopril to 10 mg daily and follow BP [name]

## 2019-11-17 NOTE — Telephone Encounter (Signed)
Followed up with Laurie [name] about re-scheduling OV w/ Dr. Curt Bears for her PPM. (Laurie [name] has not been seen in office by EP since implant) Laurie [name] reports recent health issues which include an aneurysm.  Laurie [name] reports that she is not able to leave the house currently in her condition.  She reports hospice is helping right now. Laurie [name] informed that this is simply a follow up and not urgent as we can still monitor her device remotely.  Explained that we are following up being that she hasn't seen an EP doctor since PPM implant.  Informed Laurie [name] that I would update Dr. Curt Bears to current status and follow up with her some time beginning of next year to re-evaluate. Marland Kitchenveb

## 2019-11-17 NOTE — Telephone Encounter (Signed)
  Pt c/o BP issue: STAT if pt c/o blurred vision, one-sided weakness or slurred speech  1. What are your last 5 BP readings?  Patient did not have them, but stated they ranged from 95-165 and reached 200 at night.  2. Are you having any other symptoms (ex. Dizziness, headache, blurred vision, passed out)? no  3. What is your BP issue? Patient states her BP has been getting very high at night and wants to know if her lisinopril (ZESTRIL) 5 MG tablet should be increased.

## 2019-11-17 NOTE — Telephone Encounter (Signed)
Called patient back about her message. Patient stated she had been having high BP at night over the last couple of weeks. Patient stated she feels fine except for sometimes she has a headache with the high BP. Patient wanted to know if she could just increase her lisinopril. Will forward to Dr. Stanford Breed and his nurse.

## 2019-11-20 MED ORDER — LISINOPRIL 10 MG PO TABS: 10.0000 mg | ORAL_TABLET | Freq: Every day | ORAL | 3 refills | Status: DC

## 2019-11-20 NOTE — Telephone Encounter (Signed)
Left detailed message for patient, New script sent to the pharmacy

## 2019-12-12 ENCOUNTER — Telehealth: Payer: Self-pay

## 2019-12-12 NOTE — Telephone Encounter (Signed)
Thank you for reaching out to our office.  If you are in Phase 1b, age 83 and over, you can contact the [facility] Department at [phone] to schedule an appointment for your vaccine.  Phone lines will be open 8 AM to 6:30 PM Monday through Friday, and 8 AM to 1 PM on Saturdays and Sundays.  They will start the vaccine clinic 12/06/2019 per the [facility] Department web site. If you do not live in [facility] I would recommend you go to your county health department web site to find out how to register for the vaccine and where to go.

## 2019-12-15 ENCOUNTER — Other Ambulatory Visit: Payer: Medicare HMO

## 2019-12-15 ENCOUNTER — Ambulatory Visit (INDEPENDENT_AMBULATORY_CARE_PROVIDER_SITE_OTHER): Admitting: *Deleted

## 2019-12-15 DIAGNOSIS — Z950 Presence of cardiac pacemaker: Secondary | ICD-10-CM | POA: Diagnosis not present

## 2019-12-16 LAB — CUP PACEART REMOTE DEVICE CHECK
Battery Remaining Longevity: 115 mo
Battery Remaining Percentage: 95.5 %
Battery Voltage: 3.01 V
Brady Statistic AP VP Percent: 1 %
Brady Statistic AP VS Percent: 69 %
Brady Statistic AS VP Percent: 1 %
Brady Statistic AS VS Percent: 30 %
Brady Statistic RA Percent Paced: 68 %
Brady Statistic RV Percent Paced: 1 %
Date Time Interrogation Session: 20210115073226
Implantable Lead Implant Date: 20200106
Implantable Lead Implant Date: 20200106
Implantable Lead Location: 753859
Implantable Lead Location: 753860
Implantable Pulse Generator Implant Date: 20200106
Lead Channel Impedance Value: 480 Ohm
Lead Channel Impedance Value: 590 Ohm
Lead Channel Pacing Threshold Amplitude: 0.5 V
Lead Channel Pacing Threshold Amplitude: 0.75 V
Lead Channel Pacing Threshold Pulse Width: 0.5 ms
Lead Channel Pacing Threshold Pulse Width: 0.5 ms
Lead Channel Sensing Intrinsic Amplitude: 12 mV
Lead Channel Sensing Intrinsic Amplitude: 3.2 mV
Lead Channel Setting Pacing Amplitude: 0.75 V
Lead Channel Setting Pacing Amplitude: 2.5 V
Lead Channel Setting Pacing Pulse Width: 0.5 ms
Lead Channel Setting Sensing Sensitivity: 2 mV
Pulse Gen Model: 2272
Pulse Gen Serial Number: 9089721

## 2019-12-18 ENCOUNTER — Ambulatory Visit: Payer: Medicare HMO | Admitting: Thoracic Surgery (Cardiothoracic Vascular Surgery)

## 2019-12-28 ENCOUNTER — Ambulatory Visit: Payer: Medicare HMO

## 2019-12-29 NOTE — Telephone Encounter (Signed)
Well shoot!! None of those really apply to her. How expensive is the test? Do you know?

## 2019-12-29 NOTE — Telephone Encounter (Signed)
The codes I added did cover most of the tests, except for Zinc. I have list below a statement from Medicare Coverage.   20. Zinc. Serum zinc and/or urine testing is covered for beneficiaries with documented industrial exposure to zinc, on chronic renal dialysis, with malabsorptionsyndromes, Crohn'sdisease, unexplained myelodysplasticsyndrome or known ingestion of zinc.

## 2019-12-29 NOTE — Telephone Encounter (Signed)
91

## 2020-01-01 NOTE — Telephone Encounter (Signed)
Lets try intestinal malabsorption.  She does have low iron which certainly could be because of poor absorption.  So lets try K90.89

## 2020-01-03 NOTE — Telephone Encounter (Signed)
Updated billing trailer.

## 2020-01-04 ENCOUNTER — Ambulatory Visit: Attending: Internal Medicine

## 2020-01-04 DIAGNOSIS — Z23 Encounter for immunization: Secondary | ICD-10-CM | POA: Insufficient documentation

## 2020-01-04 NOTE — Progress Notes (Signed)
   Covid-19 Vaccination Clinic  Name:  Laurie [name]    MRN: [id number] DOB: 1934/12/08  01/04/2020  [name] was observed post Covid-19 immunization for 15 minutes without incidence. She was provided with Vaccine Information Sheet and instruction to access the V-Safe system.   [name] was instructed to call 911 with any severe reactions post vaccine: Marland Kitchen Difficulty breathing  . Swelling of your face and throat  . A fast heartbeat  . A bad rash all over your body  . Dizziness and weakness    Immunizations Administered    Name Date Dose VIS Date Route   Pfizer COVID-19 Vaccine 01/04/2020  1:19 PM 0.3 mL 11/10/2019 Intramuscular   Manufacturer: [place]   Lot: [id number]   [place]: [address]-5

## 2020-01-18 ENCOUNTER — Ambulatory Visit: Payer: Medicare HMO

## 2020-01-25 ENCOUNTER — Other Ambulatory Visit: Payer: Self-pay | Admitting: Cardiology

## 2020-01-26 ENCOUNTER — Telehealth: Payer: Self-pay | Admitting: Cardiology

## 2020-01-26 NOTE — Telephone Encounter (Signed)
New Message  Pt called and wanted to speak with a nurse about questions she had about her medication.  Please call to discuss

## 2020-01-26 NOTE — Telephone Encounter (Signed)
Spoke with patient's daughter in law. Call was placed by mistake. Daughter in law reports patient is under hospice care and is very confused. Daughter in law to check in on patient.

## 2020-01-30 ENCOUNTER — Ambulatory Visit: Payer: Medicare HMO | Attending: Internal Medicine

## 2020-01-30 DIAGNOSIS — Z23 Encounter for immunization: Secondary | ICD-10-CM

## 2020-01-30 NOTE — Progress Notes (Signed)
   Covid-19 Vaccination Clinic  Name:  Laurie [name]    MRN: [id number] DOB: Jul 18, 1935  01/30/2020  Laurie [name] was observed post Covid-19 immunization for 15 minutes without incident. She was provided with Vaccine Information Sheet and instruction to access the V-Safe system.   Laurie [name] was instructed to call 911 with any severe reactions post vaccine: Marland Kitchen Difficulty breathing  . Swelling of face and throat  . A fast heartbeat  . A bad rash all over body  . Dizziness and weakness   Immunizations Administered    Name Date Dose VIS Date Route   Pfizer COVID-19 Vaccine 01/30/2020  2:06 PM 0.3 mL 11/10/2019 Intramuscular   Manufacturer: [place]   Lot: [id number]   [place]: [address]-0

## 2020-02-21 ENCOUNTER — Ambulatory Visit: Payer: Medicare HMO | Admitting: Cardiology

## 2020-02-22 ENCOUNTER — Ambulatory Visit: Payer: Medicare HMO | Admitting: Cardiology

## 2020-02-23 NOTE — Progress Notes (Signed)
Virtual Visit via Video Note   This visit type was conducted due to national recommendations for restrictions regarding the COVID-19 Pandemic (e.g. social distancing) in an effort to limit this patient's exposure and mitigate transmission in our community.  Due to her co-morbid illnesses, this patient is at least at moderate risk for complications without adequate follow up.  This format is felt to be most appropriate for this patient at this time.  All issues noted in this document were discussed and addressed.  A limited physical exam was performed with this format.  Please refer to the patient's chart for her consent to telehealth for [facility].   Date:  02/26/2020   ID:  Laurie [name], DOB Apr 05, 1935, MRN [id number]  Patient Location:Home Provider Location: Home  PCP:  Hali Marry, MD  Cardiologist:  Dr Stanford Breed  Evaluation Performed:  Follow-Up Visit  Chief Complaint:  FU TAA  History of Present Illness:    FU aortic dissection, atrial fibrillation, CAD, ho aortic stenosis status post aortic valve replacement in September 2009. She had a pericardial tissue valve; also had a septal myectomy and a right middle lobectomy due to lung CA. Note, she had a 50% mid LAD by catheterization preoperatively. Carotid Dopplers November 2017 showed mild bilateral atherosclerosis and no hemodynamically significant stenosis. Dopplers 5/18: ABI of 0.57 on the right and 0.76 on the left. Waveforms were suggestive of inflow disease. Duplex showed significant distal aortic and bilateral common iliac artery stenosis with moderate left SFA stenosis. Seen by Dr Fletcher Anon and medical therapy recommended for now.Amiodarone added at previousov for recurrent atrial fibrillation.Patient was admitted January 2020 with recurrent atrial fibrillation and was noted to have posttermination pauses. Pacemaker was placed.Echocardiogram repeated May 2020 and showed vigorous LV function. Bioprosthetic  aortic valve not well visualized but gradient is normal. Abdominal CT September 2020 showed 3.3 x 4 cm infrarenal abdominal aortic aneurysm. Chest CT October 2020 showed new type a thoracic aortic dissection in the ascending thoracic aorta now measured 7 cm.  Patient was admitted and seen by cardiothoracic surgery.  It was felt that she would not be a good candidate for aortic root replacement and medical therapy recommended.  Xarelto was discontinued due to aneurysm/dissection. Since I last saw her,she denies dyspnea.  She occasionally has pain around her pacemaker site and chest tightness.  She complains of lower abdominal pain which has been continuous since her dissection was diagnosed.  She has low back pain.  No syncope.  The patient does not have symptoms concerning for COVID-19 infection (fever, chills, cough, or new shortness of breath).    Past Medical History:  Diagnosis Date  . AAA (abdominal aortic aneurysm) ([place])   . Anemia    iron deficiency  . Ascending aorta dilatation ([facility])   . Atrial fibrillation ([place])    post op  . CAD (coronary artery disease)   . Carcinoma of lung ([place]) 08/28/2008   T2N0 moderately diff. squamous cell CA  . Cerebrovascular disease   . COPD (chronic obstructive pulmonary disease) ([place]) 2005   on CXR with R apical scaring  . Dissecting aneurysm of ascending aorta ([place]) 09/11/2019   Not a candidate for surgical intervention on medical treatment for BP control  . Headache(784.0)    Hx: of migraines in the past  . Hypercholesterolemia    Hx: of  . Hypertension   . Incidental pulmonary nodule, > 76mm and < 46mm    LLL  . S/P aortic valve replacement 08/28/2008  [facility]  . S/P lobectomy of lung 08/28/2008   rt middle lobe  dr. Roxy Manns  . Shingles 11-09  . Thoracic aortic dissection ([place]) 09/11/2019   Past Surgical History:  Procedure Laterality Date  . ABDOMINAL SURGERY    . AORTIC VALVE REPLACEMENT  08/28/2008   #80mm [facility] Ease pericardial  tissue valve  . APPENDECTOMY    . CARDIAC CATHETERIZATION    . CATARACT EXTRACTION W/ INTRAOCULAR LENS  IMPLANT, BILATERAL Bilateral   . CHOLECYSTECTOMY N/A 12/13/2013   Procedure: LAPAROSCOPIC CHOLECYSTECTOMY;  Surgeon: Ralene Ok, MD;  Location: [place];  Service: General;  Laterality: N/A;  . COLONOSCOPY     \  . DILATION AND CURETTAGE OF UTERUS    . ERCP N/A 05/08/2015   Procedure: ENDOSCOPIC RETROGRADE CHOLANGIOPANCREATOGRAPHY (ERCP);  Surgeon: Inda Castle, MD;  Location: [place];  Service: Endoscopy;  Laterality: N/A;  . ERCP N/A 01/13/2016   Procedure: ENDOSCOPIC RETROGRADE CHOLANGIOPANCREATOGRAPHY (ERCP);  Surgeon: Doran Stabler, MD;  Location: [facility];  Service: Endoscopy;  Laterality: N/A;  . ERCP N/A 03/09/2016   Procedure: ENDOSCOPIC RETROGRADE CHOLANGIOPANCREATOGRAPHY (ERCP);  Surgeon: Ladene Artist, MD;  Location: [facility];  Service: Endoscopy;  Laterality: N/A;  . PACEMAKER IMPLANT N/A 12/05/2018   Procedure: PACEMAKER IMPLANT;  Surgeon: Evans Lance, MD;  Location: [place] [facility];  Service: Cardiovascular;  Laterality: N/A;  . RML removed Dr Ricard Dillon for lung cancer  08/28/2008   T2N0 squamous cell CA  . TONSILLECTOMY AND ADENOIDECTOMY    . u/s guided aspiration of R breast cyst   2009   at the breast clinic     Current Meds  Medication Sig  . amiodarone (PACERONE) 100 MG tablet Take 10 mg by mouth daily.     Allergies:   Hyoscyamine, Rofecoxib, Statins, and Metoprolol   Social History   Tobacco Use  . Smoking status: Former Smoker    Years: 40.00  . Smokeless tobacco: Never Used  Substance Use Topics  . Alcohol use: No    Alcohol/week: 0.0 standard drinks  . Drug use: No     Family Hx: The patient's family history includes Diabetes in her son; Stroke in her father. There is no history of Colon polyps, Colon cancer, Esophageal cancer, Stomach cancer, Pancreatic cancer, Kidney disease, or Liver disease.  ROS:   Please see the  history of present illness.  Abdominal and back pain. No Fever, chills  or productive cough All other systems reviewed and are negative.   Recent Labs: 04/04/2019: TSH 2.86 04/12/2019: Magnesium 2.2 08/11/2019: ALT 20 09/13/2019: Hemoglobin 13.0; Platelets 186 09/14/2019: BUN 7; Creatinine, Ser 0.82; Potassium 3.9; Sodium 129   Recent Lipid Panel Lab Results  Component Value Date/Time   CHOL 291 (H) 09/18/2010 08:32 PM   TRIG 308 (H) 09/18/2010 08:32 PM   HDL 38 (L) 09/18/2010 08:32 PM   CHOLHDL 7.7 Ratio 09/18/2010 08:32 PM   LDLCALC 191 (H) 09/18/2010 08:32 PM    Wt Readings from Last 3 Encounters:  11/15/19 158 lb (71.7 kg)  09/15/19 162 lb 4.8 oz (73.6 kg)  09/06/19 163 lb (73.9 kg)     Objective:    Vital Signs:  There were no vitals taken for this visit.   VITAL SIGNS:  reviewed NAD Answers questions appropriately Normal affect Remainder of physical examination not performed (telehealth visit; coronavirus pandemic)  ASSESSMENT & PLAN:    1. Thoracic aortic aneurysm/type a aortic dissection-as outlined above patient was felt not to  be a candidate for aortic root replacement and medical therapy has been recommended.  We will continue to adjust medications to optimize blood pressure control. 2. Paroxysmal atrial fibrillation-continue amiodarone.  Check TSH and liver functions.  We discontinued anticoagulation previously due to aortic dissection. 3. Aortic valve replacement-continue SBE prophylaxis. 4. Hyperlipidemia-patient is intolerant to statins.  We will continue diet. 5. Coronary artery disease-continue aspirin.  Intolerant to statins. 6. Hypertension-We will continue present medications. 7. Thoracic, aortic and iliac aneurysms-patient is not a candidate for intervention and we will therefore not pursue further imaging. 8. Pacemaker-followed by Dr. Curt Bears.  COVID-19 Education: The importance of social distancing was discussed today.  Time:   Today, I have  spent 16 minutes with the patient with telehealth technology discussing the above problems.     Medication Adjustments/Labs and Tests Ordered: Current medicines are reviewed at length with the patient today.  Concerns regarding medicines are outlined above.   Tests Ordered: No orders of the defined types were placed in this encounter.   Medication Changes: No orders of the defined types were placed in this encounter.   Follow Up:  Either In Person or Virtual in 6 month(s)  Signed, [name], MD  02/26/2020 9:34 AM    [place]

## 2020-02-26 ENCOUNTER — Encounter: Payer: Self-pay | Admitting: Cardiology

## 2020-02-26 ENCOUNTER — Telehealth (INDEPENDENT_AMBULATORY_CARE_PROVIDER_SITE_OTHER): Admitting: Cardiology

## 2020-02-26 DIAGNOSIS — Z87891 Personal history of nicotine dependence: Secondary | ICD-10-CM | POA: Diagnosis not present

## 2020-02-26 DIAGNOSIS — Z952 Presence of prosthetic heart valve: Secondary | ICD-10-CM

## 2020-02-26 DIAGNOSIS — I7121 Aneurysm of the ascending aorta, without rupture: Secondary | ICD-10-CM

## 2020-02-26 DIAGNOSIS — I480 Paroxysmal atrial fibrillation: Secondary | ICD-10-CM | POA: Diagnosis not present

## 2020-02-26 DIAGNOSIS — I10 Essential (primary) hypertension: Secondary | ICD-10-CM | POA: Diagnosis not present

## 2020-02-26 DIAGNOSIS — I2510 Atherosclerotic heart disease of native coronary artery without angina pectoris: Secondary | ICD-10-CM

## 2020-02-26 DIAGNOSIS — Z7189 Other specified counseling: Secondary | ICD-10-CM

## 2020-02-26 DIAGNOSIS — E785 Hyperlipidemia, unspecified: Secondary | ICD-10-CM | POA: Diagnosis not present

## 2020-02-26 DIAGNOSIS — I7101 Dissection of thoracic aorta: Secondary | ICD-10-CM | POA: Diagnosis not present

## 2020-02-26 DIAGNOSIS — Z950 Presence of cardiac pacemaker: Secondary | ICD-10-CM

## 2020-02-26 DIAGNOSIS — J479 Bronchiectasis, uncomplicated: Secondary | ICD-10-CM | POA: Diagnosis not present

## 2020-02-26 DIAGNOSIS — I712 Thoracic aortic aneurysm, without rupture: Secondary | ICD-10-CM | POA: Diagnosis not present

## 2020-02-26 DIAGNOSIS — E119 Type 2 diabetes mellitus without complications: Secondary | ICD-10-CM | POA: Diagnosis not present

## 2020-02-26 DIAGNOSIS — G20 Parkinson's disease: Secondary | ICD-10-CM | POA: Diagnosis not present

## 2020-02-26 DIAGNOSIS — J449 Chronic obstructive pulmonary disease, unspecified: Secondary | ICD-10-CM | POA: Diagnosis not present

## 2020-02-26 NOTE — Patient Instructions (Signed)
Medication Instructions:  NO CHANGE *If you need a refill on your cardiac medications before your next appointment, please call your pharmacy*   Lab Work: Your physician recommends that you return for lab work WHEN CONVENIENT  If you have labs (blood work) drawn today and your tests are completely normal, you will receive your results only by: Marland Kitchen MyChart Message (if you have MyChart) OR . A paper copy in the mail If you have any lab test that is abnormal or we need to change your treatment, we will call you to review the results.  Follow-Up: At [facility], you and your health needs are our priority.  As part of our continuing mission to provide you with exceptional heart care, we have created designated Provider Care Teams.  These Care Teams include your primary Cardiologist (physician) and Advanced Practice Providers (APPs -  Physician Assistants and Nurse Practitioners) who all work together to provide you with the care you need, when you need it.  We recommend signing up for the patient portal called "MyChart".  Sign up information is provided on this After Visit Summary.  MyChart is used to connect with patients for Virtual Visits (Telemedicine).  Patients are able to view lab/test results, encounter notes, upcoming appointments, etc.  Non-urgent messages can be sent to your provider as well.   To learn more about what you can do with MyChart, go to [email or web address].    Your next appointment:   6 month(s)  The format for your next appointment:   Either In Person or Virtual  Provider:   [name], MD

## 2020-03-06 ENCOUNTER — Telehealth: Payer: Self-pay

## 2020-03-06 NOTE — Telephone Encounter (Addendum)
Unable to leave message, voicemail full. Called dtr-in-law and asked her to have pt call me to discuss this further  (pt also need appt w/ [facility] to establish device)

## 2020-03-06 NOTE — Telephone Encounter (Signed)
Pt called and has questions about her amiodarone and why does she have to take it If she has a pacemaker. Please call and advise her

## 2020-03-15 ENCOUNTER — Ambulatory Visit (INDEPENDENT_AMBULATORY_CARE_PROVIDER_SITE_OTHER): Admitting: *Deleted

## 2020-03-15 DIAGNOSIS — Z950 Presence of cardiac pacemaker: Secondary | ICD-10-CM | POA: Diagnosis not present

## 2020-03-15 LAB — CUP PACEART REMOTE DEVICE CHECK
Battery Remaining Longevity: 121 mo
Battery Remaining Percentage: 95.5 %
Battery Voltage: 3.01 V
Brady Statistic AP VP Percent: 1 %
Brady Statistic AP VS Percent: 68 %
Brady Statistic AS VP Percent: 1 %
Brady Statistic AS VS Percent: 32 %
Brady Statistic RA Percent Paced: 67 %
Brady Statistic RV Percent Paced: 1 %
Date Time Interrogation Session: 20210416051309
Implantable Lead Implant Date: 20200106
Implantable Lead Implant Date: 20200106
Implantable Lead Location: 753859
Implantable Lead Location: 753860
Implantable Pulse Generator Implant Date: 20200106
Lead Channel Impedance Value: 460 Ohm
Lead Channel Impedance Value: 540 Ohm
Lead Channel Pacing Threshold Amplitude: 0.5 V
Lead Channel Pacing Threshold Amplitude: 0.75 V
Lead Channel Pacing Threshold Pulse Width: 0.5 ms
Lead Channel Pacing Threshold Pulse Width: 0.5 ms
Lead Channel Sensing Intrinsic Amplitude: 12 mV
Lead Channel Sensing Intrinsic Amplitude: 2.6 mV
Lead Channel Setting Pacing Amplitude: 0.75 V
Lead Channel Setting Pacing Amplitude: 2.5 V
Lead Channel Setting Pacing Pulse Width: 0.5 ms
Lead Channel Setting Sensing Sensitivity: 2 mV
Pulse Gen Model: 2272
Pulse Gen Serial Number: 9089721

## 2020-03-15 NOTE — Progress Notes (Signed)
PPM Remote  

## 2020-03-18 NOTE — Telephone Encounter (Signed)
Spoke with pt, Aware of dr crenshaw's recommendations.  °

## 2020-03-18 NOTE — Telephone Encounter (Signed)
Resume amiodarone at previous dose [name]

## 2020-03-18 NOTE — Telephone Encounter (Signed)
Attempted to reach pt again, pt answered phone Pt reports that she never got a message that I called.  She reports that she is not taking Amiodarone. Pt states that "hospice RN stopped it".  Pt is unsure why it was stopped. Wynell Balloon, NP w/ hospice called this afternoon to inform me that pt requested to stop it earlier this month.  Pt and her dtr reviewed information on Amiodarone and didn't like the SE, and did not want to continue taking it. Hospice NP did start Diltiazem 30 mg q6h PRN to help with any elevated HRs. BP/HR today:  132/80, hr 74. Pt reports feeling weird since stopping it.  Palpitations occurring since stopping.  Aware that I will forward this information to Dr. Stanford Breed for FYI/advisement......Marland Kitchen restart Amiodarone?, wait and monitor? Dr. Stanford Breed - I am sending this to you as Dr. Curt Bears has not seen pt yet to establish her device w/ our clinic. NP aware nurse will call once advised by MD.     Wynell Balloon, NP (hospice) [phone]

## 2020-05-03 ENCOUNTER — Telehealth: Payer: Self-pay | Admitting: Family Medicine

## 2020-05-03 DIAGNOSIS — L603 Nail dystrophy: Secondary | ICD-10-CM

## 2020-05-03 NOTE — Telephone Encounter (Signed)
Patient advised referral was placed and has been faxed

## 2020-05-03 NOTE — Telephone Encounter (Signed)
Laurie [name] needs a referral sent to [facility]. Needed for insurance   [place], [place],  [address] Phone [phone]

## 2020-05-03 NOTE — Addendum Note (Signed)
Addended by: Teddy Spike on: 05/03/2020 09:52 AM   Modules accepted: Orders

## 2020-05-10 ENCOUNTER — Telehealth: Payer: Self-pay | Admitting: Cardiology

## 2020-05-10 MED ORDER — AMOXICILLIN 500 MG PO TABS: ORAL_TABLET | ORAL | 6 refills | Status: AC

## 2020-05-10 NOTE — Telephone Encounter (Signed)
Spoke with pt, she went to the podiatrist to have nails clipped and on was loose. She may need to have it removed and needs the antibiotics that she takes for procedures. New script sent to the pharmacy

## 2020-05-10 NOTE — Telephone Encounter (Signed)
New Message   Pt is calling and says she is having a toe nail removed on Tuesday 06/15  Pt is wondering if she needs to take an antibiotic   Please call

## 2020-05-14 ENCOUNTER — Ambulatory Visit: Payer: Self-pay | Admitting: Podiatry

## 2020-05-20 ENCOUNTER — Ambulatory Visit: Payer: Self-pay | Admitting: Podiatry

## 2020-06-12 ENCOUNTER — Other Ambulatory Visit: Payer: Self-pay

## 2020-06-12 ENCOUNTER — Ambulatory Visit: Payer: Medicare HMO | Admitting: Podiatry

## 2020-06-12 DIAGNOSIS — B351 Tinea unguium: Secondary | ICD-10-CM

## 2020-06-12 DIAGNOSIS — M79674 Pain in right toe(s): Secondary | ICD-10-CM

## 2020-06-12 DIAGNOSIS — M79675 Pain in left toe(s): Secondary | ICD-10-CM | POA: Diagnosis not present

## 2020-06-13 ENCOUNTER — Encounter: Payer: Self-pay | Admitting: Podiatry

## 2020-06-13 NOTE — Progress Notes (Signed)
°  Subjective:  Patient ID: Laurie [name], female    DOB: 08-Apr-1935,  MRN: [id number]  Chief Complaint  Patient presents with   Nail trim   85 y.o. female returns for the above complaint.  Patient presents with thickened elongated dystrophic toenails x10.  They are mildly painful to touch.  Patient has had a hard time ambulating and cutting it herself.  Patient would like to have me debride them down.  She denies any other acute complaint.  She is no longer on anticoagulation medication.  Objective:  There were no vitals filed for this visit. Podiatric Exam: Vascular: dorsalis pedis and posterior tibial pulses are palpable bilateral. Capillary return is immediate. Temperature gradient is WNL. Skin turgor WNL  Sensorium: Normal Semmes Weinstein monofilament test. Normal tactile sensation bilaterally. Nail Exam: Pt has thick disfigured discolored nails with subungual debris noted bilateral entire nail hallux through fifth toenails.  Pain on palpation to the nails. Ulcer Exam: There is no evidence of ulcer or pre-ulcerative changes or infection. Orthopedic Exam: Muscle tone and strength are WNL. No limitations in general ROM. No crepitus or effusions noted. HAV  B/L.  Hammer toes 2-5  B/L. Skin: No Porokeratosis. No infection or ulcers    Assessment & Plan:  No diagnosis found.  Patient was evaluated and treated and all questions answered.  Onychomycosis with pain  -Nails palliatively debrided as below. -Educated on self-care  Procedure: Nail Debridement Rationale: pain  Type of Debridement: manual, sharp debridement. Instrumentation: Nail nipper, rotary burr. Number of Nails: 10  Procedures and Treatment: Consent by patient was obtained for treatment procedures. The patient understood the discussion of treatment and procedures well. All questions were answered thoroughly reviewed. Debridement of mycotic and hypertrophic toenails, 1 through 5 bilateral and clearing of subungual  debris. No ulceration, no infection noted.  Return Visit-Office Procedure: Patient instructed to return to the office for a follow up visit 3 months for continued evaluation and treatment.  Boneta Lucks, DPM    No follow-ups on file.

## 2020-06-14 ENCOUNTER — Ambulatory Visit (INDEPENDENT_AMBULATORY_CARE_PROVIDER_SITE_OTHER): Admitting: *Deleted

## 2020-06-14 ENCOUNTER — Telehealth: Payer: Self-pay | Admitting: Cardiology

## 2020-06-14 DIAGNOSIS — I495 Sick sinus syndrome: Secondary | ICD-10-CM

## 2020-06-14 NOTE — Telephone Encounter (Signed)
Spoke with pt to advise that scheduled transmission was received today at 11:49. Pt verbalizes understanding and appreciation of call.

## 2020-06-14 NOTE — Telephone Encounter (Signed)
New message   Patient is calling to see if a transmission has been received or if she needs to do a transmission. Please call.

## 2020-06-16 LAB — CUP PACEART REMOTE DEVICE CHECK
Battery Remaining Longevity: 124 mo
Battery Remaining Percentage: 95.5 %
Battery Voltage: 3.01 V
Brady Statistic AP VP Percent: 1 %
Brady Statistic AP VS Percent: 51 %
Brady Statistic AS VP Percent: 1 %
Brady Statistic AS VS Percent: 49 %
Brady Statistic RA Percent Paced: 50 %
Brady Statistic RV Percent Paced: 1 %
Date Time Interrogation Session: 20210716114905
Implantable Lead Implant Date: 20200106
Implantable Lead Implant Date: 20200106
Implantable Lead Location: 753859
Implantable Lead Location: 753860
Implantable Pulse Generator Implant Date: 20200106
Lead Channel Impedance Value: 460 Ohm
Lead Channel Impedance Value: 540 Ohm
Lead Channel Pacing Threshold Amplitude: 0.625 V
Lead Channel Pacing Threshold Amplitude: 0.75 V
Lead Channel Pacing Threshold Pulse Width: 0.5 ms
Lead Channel Pacing Threshold Pulse Width: 0.5 ms
Lead Channel Sensing Intrinsic Amplitude: 12 mV
Lead Channel Sensing Intrinsic Amplitude: 3.7 mV
Lead Channel Setting Pacing Amplitude: 0.875
Lead Channel Setting Pacing Amplitude: 2.5 V
Lead Channel Setting Pacing Pulse Width: 0.5 ms
Lead Channel Setting Sensing Sensitivity: 2 mV
Pulse Gen Model: 2272
Pulse Gen Serial Number: 9089721

## 2020-06-17 NOTE — Progress Notes (Signed)
Remote pacemaker transmission.   

## 2020-07-19 ENCOUNTER — Telehealth: Payer: Self-pay | Admitting: Cardiology

## 2020-07-19 NOTE — Telephone Encounter (Signed)
Patient states she has been having pain and losing circulation in her left arm. She denies additional symptoms. Please call to discuss.

## 2020-07-19 NOTE — Telephone Encounter (Signed)
Spoke with pt, virtual follow up appointment with dr Stanford Breed scheduled. This morning she said her arm felt like she was not getting good circulation. Now she is feeling it in her shoulder. She rubbed her arm and it got better. She is worried that her device is not working well. She is waiting for the device clinic to call her back.

## 2020-08-20 NOTE — Progress Notes (Signed)
Virtual Visit via Video Note changed to phone visit at patient request.   This visit type was conducted due to national recommendations for restrictions regarding the COVID-19 Pandemic (e.g. social distancing) in an effort to limit this patient's exposure and mitigate transmission in our community.  Due to her co-morbid illnesses, this patient is at least at moderate risk for complications without adequate follow up.  This format is felt to be most appropriate for this patient at this time.  All issues noted in this document were discussed and addressed.  A limited physical exam was performed with this format.  Please refer to the patient's chart for her consent to telehealth for [facility].      Date:  08/29/2020   ID:  Laurie [name], DOB 1934-12-26, MRN [id number]  Patient Location:Home Provider Location: Home  PCP:  Hali Marry, MD  Cardiologist:  Dr Stanford Breed  Evaluation Performed:  Follow-Up Visit  Chief Complaint:  FU Aortic dissection  History of Present Illness:    FUaortic dissection,atrial fibrillation, CAD, ho aortic stenosis status post aortic valve replacement in September 2009. She had a pericardial tissue valve; also had a septal myectomy and a right middle lobectomy due to lung CA. Note, she had a 50% mid LAD by catheterization preoperatively. Carotid Dopplers November 2017 showed mild bilateral atherosclerosis and no hemodynamically significant stenosis. Dopplers 5/18: ABI of 0.57 on the right and 0.76 on the left. Waveforms were suggestive of inflow disease. Duplex showed significant distal aortic and bilateral common iliac artery stenosis with moderate left SFA stenosis. Seen by Dr Fletcher Anon and medical therapy recommended for now.Amiodarone added at previousov for recurrent atrial fibrillation.Patient was admitted January 2020 with recurrent atrial fibrillation and was noted to have posttermination pauses. Pacemaker was placed.Echocardiogram repeated  May 2020 and showed vigorous LV function. Bioprosthetic aortic valve not well visualized but gradient is normal. Abdominal CT September 2020 showed 3.3 x 4 cm infrarenal abdominal aortic aneurysm. Chest CT October 2020 showed new type a thoracic aortic dissection in the ascending thoracic aorta now measured 7 cm. Patient was admitted and seen by cardiothoracic surgery. It was felt that she would not be a good candidate for aortic root replacement and medical therapy recommended. Xarelto was discontinued due to aneurysm/dissection. Since I last saw her,she has some dyspnea that improves with inhalers.  She has chest pain, back pain and decreased energy.  She is now walking using a walker.  She has not had syncope.  The patient does not have symptoms concerning for COVID-19 infection (fever, chills, cough, or new shortness of breath).    Past Medical History:  Diagnosis Date  . AAA (abdominal aortic aneurysm) ([place])   . Anemia    iron deficiency  . Ascending aorta dilatation (H[facility])   . Atrial fibrillation ([place])    post op  . CAD (coronary artery disease)   . Carcinoma of lung ([place]) 08/28/2008   T2N0 moderately diff. squamous cell CA  . Cerebrovascular disease   . COPD (chronic obstructive pulmonary disease) ([place]) 2005   on CXR with R apical scaring  . Dissecting aneurysm of ascending aorta ([place]) 09/11/2019   Not a candidate for surgical intervention on medical treatment for BP control  . Headache(784.0)    Hx: of migraines in the past  . Hypercholesterolemia    Hx: of  . Hypertension   . Incidental pulmonary nodule, > 30mm and < 44mm    LLL  . S/P aortic valve replacement 08/28/2008  [facility]  . S/P lobectomy of lung 08/28/2008   rt middle lobe  dr. Roxy Manns  . Shingles 11-09  . Thoracic aortic dissection ([place]) 09/11/2019   Past Surgical History:  Procedure Laterality Date  . ABDOMINAL SURGERY    . AORTIC VALVE REPLACEMENT  08/28/2008   #68mm [facility] Ease pericardial tissue valve   . APPENDECTOMY    . CARDIAC CATHETERIZATION    . CATARACT EXTRACTION W/ INTRAOCULAR LENS  IMPLANT, BILATERAL Bilateral   . CHOLECYSTECTOMY N/A 12/13/2013   Procedure: LAPAROSCOPIC CHOLECYSTECTOMY;  Surgeon: Ralene Ok, MD;  Location: [place];  Service: General;  Laterality: N/A;  . COLONOSCOPY     \  . DILATION AND CURETTAGE OF UTERUS    . ERCP N/A 05/08/2015   Procedure: ENDOSCOPIC RETROGRADE CHOLANGIOPANCREATOGRAPHY (ERCP);  Surgeon: Inda Castle, MD;  Location: [place];  Service: Endoscopy;  Laterality: N/A;  . ERCP N/A 01/13/2016   Procedure: ENDOSCOPIC RETROGRADE CHOLANGIOPANCREATOGRAPHY (ERCP);  Surgeon: Doran Stabler, MD;  Location: [facility];  Service: Endoscopy;  Laterality: N/A;  . ERCP N/A 03/09/2016   Procedure: ENDOSCOPIC RETROGRADE CHOLANGIOPANCREATOGRAPHY (ERCP);  Surgeon: Ladene Artist, MD;  Location: [facility];  Service: Endoscopy;  Laterality: N/A;  . PACEMAKER IMPLANT N/A 12/05/2018   Procedure: PACEMAKER IMPLANT;  Surgeon: Evans Lance, MD;  Location: [facility];  Service: Cardiovascular;  Laterality: N/A;  . RML removed Dr Ricard Dillon for lung cancer  08/28/2008   T2N0 squamous cell CA  . TONSILLECTOMY AND ADENOIDECTOMY    . u/s guided aspiration of R breast cyst   2009   at the breast clinic     Current Meds  Medication Sig  . acetaminophen (TYLENOL) 500 MG tablet Take 2 tablets (1,000 mg total) by mouth every 8 (eight) hours as needed (pain).  Marland Kitchen albuterol (PROVENTIL) (2.5 MG/3ML) 0.083% nebulizer solution Take 3 mLs (2.5 mg total) by nebulization every 4 (four) hours as needed for wheezing or shortness of breath.  . ALPRAZolam (XANAX) 0.5 MG tablet Take 1 tablet (0.5 mg total) by mouth 2 (two) times daily as needed for anxiety.  Marland Kitchen amiodarone (PACERONE) 100 MG tablet Take 100 mg by mouth daily.   Marland Kitchen amoxicillin (AMOXIL) 500 MG tablet Take all 4 tablets one hour before procedure  . hydrALAZINE (APRESOLINE) 10 MG tablet Take 1 tablet (10 mg  total) by mouth every 4 (four) hours as needed (SBP > 140 mmHg).  Marland Kitchen lisinopril (ZESTRIL) 10 MG tablet Take 1 tablet (10 mg total) by mouth daily.  . Oxycodone HCl 10 MG TABS Take 1 tablet by mouth every four hours  as needed, take 1 tab, if no relief in 1 hr take 2nd tablet     Allergies:   Hyoscyamine, Rofecoxib, Statins, and Metoprolol   Social History   Tobacco Use  . Smoking status: Former Smoker    Years: 40.00  . Smokeless tobacco: Never Used  Vaping Use  . Vaping Use: Never used  Substance Use Topics  . Alcohol use: No    Alcohol/week: 0.0 standard drinks  . Drug use: No     Family Hx: The patient's family history includes Diabetes in her son; Stroke in her father. There is no history of Colon polyps, Colon cancer, Esophageal cancer, Stomach cancer, Pancreatic cancer, Kidney disease, or Liver disease.  ROS:   Please see the history of present illness.    Back pain but no Fever, chills  or productive cough All other systems reviewed and are  negative.  Recent Labs: 09/13/2019: Hemoglobin 13.0; Platelets 186 09/14/2019: BUN 7; Creatinine, Ser 0.82; Potassium 3.9; Sodium 129   Recent Lipid Panel Lab Results  Component Value Date/Time   CHOL 291 (H) 09/18/2010 08:32 PM   TRIG 308 (H) 09/18/2010 08:32 PM   HDL 38 (L) 09/18/2010 08:32 PM   CHOLHDL 7.7 Ratio 09/18/2010 08:32 PM   LDLCALC 191 (H) 09/18/2010 08:32 PM    Wt Readings from Last 3 Encounters:  08/29/20 144 lb (65.3 kg)  11/15/19 158 lb (71.7 kg)  09/15/19 162 lb 4.8 oz (73.6 kg)     Objective:    Vital Signs:  Ht 5\' 6"  (1.676 m)   Wt 144 lb (65.3 kg)   BMI 23.24 kg/m    VITAL SIGNS:  reviewed NAD Answers questions appropriately Normal affect Remainder of physical examination not performed (telehealth visit; coronavirus pandemic)  ASSESSMENT & PLAN:    1. Thoracic aortic aneurysm/type a aortic dissection-patient is felt not to be a candidate for aortic root replacement and medical therapy  recommended.  Continue to optimize medications for blood pressure control. 2. Paroxysmal atrial fibrillation-continue amiodarone.  Check TSH and liver functions.  Anticoagulation has been discontinued in the setting of prior type a aortic dissection. 3. Previous aortic valve replacement-continue SBE prophylaxis. 4. Thoracic, aortic, iliac aneurysms-patient is not a candidate for intervention and therefore we will not pursue further follow-up imaging studies. 5. Hyperlipidemia-intolerant to statins. 6. Hypertension-blood pressure is controlled.  Continue present medications. 7. Previous pacemaker-followed by electrophysiology. 8. Coronary artery disease-continue aspirin.  She is intolerant to statins.  COVID-19 Education: The importance of social distancing was discussed today.  Time:   Today, I have spent 15 minutes with the patient with telehealth technology discussing the above problems.     Medication Adjustments/Labs and Tests Ordered: Current medicines are reviewed at length with the patient today.  Concerns regarding medicines are outlined above.   Tests Ordered: No orders of the defined types were placed in this encounter.   Medication Changes: No orders of the defined types were placed in this encounter.   Follow Up:  Virtual Visit  in 6 month(s)  Signed, [name], MD  08/29/2020 9:41 AM    [place]

## 2020-08-29 ENCOUNTER — Telehealth (INDEPENDENT_AMBULATORY_CARE_PROVIDER_SITE_OTHER): Admitting: Cardiology

## 2020-08-29 ENCOUNTER — Encounter: Payer: Self-pay | Admitting: Cardiology

## 2020-08-29 VITALS — Ht 66.0 in | Wt 144.0 lb

## 2020-08-29 DIAGNOSIS — I71019 Dissection of thoracic aorta, unspecified: Secondary | ICD-10-CM

## 2020-08-29 DIAGNOSIS — Z950 Presence of cardiac pacemaker: Secondary | ICD-10-CM

## 2020-08-29 DIAGNOSIS — I7101 Dissection of thoracic aorta: Secondary | ICD-10-CM

## 2020-08-29 DIAGNOSIS — Z952 Presence of prosthetic heart valve: Secondary | ICD-10-CM

## 2020-08-29 DIAGNOSIS — I480 Paroxysmal atrial fibrillation: Secondary | ICD-10-CM | POA: Diagnosis not present

## 2020-08-29 NOTE — Patient Instructions (Signed)
  Follow-Up: At [facility], you and your health needs are our priority.  As part of our continuing mission to provide you with exceptional heart care, we have created designated Provider Care Teams.  These Care Teams include your primary Cardiologist (physician) and Advanced Practice Providers (APPs -  Physician Assistants and Nurse Practitioners) who all work together to provide you with the care you need, when you need it.  We recommend signing up for the patient portal called "MyChart".  Sign up information is provided on this After Visit Summary.  MyChart is used to connect with patients for Virtual Visits (Telemedicine).  Patients are able to view lab/test results, encounter notes, upcoming appointments, etc.  Non-urgent messages can be sent to your provider as well.   To learn more about what you can do with MyChart, go to [email or web address].    Your next appointment:   6 month(s)  The format for your next appointment:   In Person  Provider:   You may see [name], MD or one of the following Advanced Practice Providers on your designated Care Team:    Kerin Ransom, PA-C  [place], [place]  Coletta Memos,

## 2020-09-04 ENCOUNTER — Telehealth: Payer: Self-pay | Admitting: Cardiology

## 2020-09-04 DIAGNOSIS — R609 Edema, unspecified: Secondary | ICD-10-CM

## 2020-09-04 MED ORDER — FUROSEMIDE 20 MG PO TABS: ORAL_TABLET | ORAL | 0 refills | Status: DC

## 2020-09-04 NOTE — Telephone Encounter (Signed)
Spoke with the patient who states that he forgot to tell Dr. Stanford Breed at her last visit that her feet have been swollen. She states that her shoes are getting tighter and tighter and it is uncomfortable. She states that she has not gained any weight and denies any SOB.  Patient states that she has not increased her salt intake but is now using [employer] Whe[employer] for food delivery.  Patient states that she tries to elevate her feet, however it sounds like she is not elevating them high enough. Advised her to use some pillows under her feet for elevation. Advised that I would let Dr. Stanford Breed know for any further recommendations.

## 2020-09-04 NOTE — Telephone Encounter (Signed)
Pt c/o swelling: STAT is pt has developed SOB within 24 hours  1) How much weight have you gained and in what time span? Has not gained weight   2) If swelling, where is the swelling located? Feet   3) Are you currently taking a fluid pill? No   4) Are you currently SOB? No   5) Do you have a log of your daily weights (if so, list)? No   6) Have you gained 3 pounds in a day or 5 pounds in a week? No   7) Have you traveled recently? No  Laurie [name] is calling stating she is having swelling in her feet and feels they are going to pop. She states her hospice Doctor won't prescribe her anything for it. Please advise.

## 2020-09-04 NOTE — Telephone Encounter (Signed)
Spoke with pt, Aware of dr crenshaw's recommendations. New script sent to the pharmacy and Lab orders mailed to the pt  

## 2020-09-04 NOTE — Telephone Encounter (Signed)
Lasix 20 mg daily as needed; bmet 2 weeks [name]

## 2020-09-09 ENCOUNTER — Telehealth: Payer: Self-pay | Admitting: Cardiology

## 2020-09-09 DIAGNOSIS — R609 Edema, unspecified: Secondary | ICD-10-CM

## 2020-09-09 NOTE — Telephone Encounter (Signed)
Pt c/o medication issue:  1. Name of Medication: Lasix  2. How are you currently taking this medication (dosage and times per day)?   3. Are you having a reaction (difficulty breathing--STAT)?   4. What is your medication issue? Patient states she has been taking it in the morning instead of at night as prescribed.  Patient is stating she doesn't go to the bathroom much.  She says she feels more bad since she stated taking this.  Her legs and ankles feel like they are going to explode at night.  She wants to know if it makes a difference if she takes it at night or in the morning.

## 2020-09-09 NOTE — Telephone Encounter (Signed)
Spoke with pt and since starting the Furosemide pt has not seen any change in swelling Per pt has taken every day since the 6th or 7th Pt has been elevating as much as possible as well and ankles and feet look no different in the am Pt states feet feel like the are going to pop.Will forward to Dr Stanford Breed for review and recommendations./cy

## 2020-09-10 NOTE — Telephone Encounter (Signed)
Change lasix to 40 mg daily; bmet one week Laurie [name]  

## 2020-09-10 NOTE — Telephone Encounter (Signed)
Left message to call back  

## 2020-09-11 MED ORDER — FUROSEMIDE 20 MG PO TABS: 40.0000 mg | ORAL_TABLET | Freq: Every day | ORAL | 3 refills | Status: DC

## 2020-09-11 NOTE — Telephone Encounter (Signed)
Patient returning phone call 

## 2020-09-11 NOTE — Telephone Encounter (Signed)
Spoke with pt, Aware of dr crenshaw's recommendations. New script sent to the pharmacy and Lab orders mailed to the pt  

## 2020-09-13 ENCOUNTER — Ambulatory Visit (INDEPENDENT_AMBULATORY_CARE_PROVIDER_SITE_OTHER): Payer: Medicare HMO

## 2020-09-13 DIAGNOSIS — I495 Sick sinus syndrome: Secondary | ICD-10-CM | POA: Diagnosis not present

## 2020-09-14 LAB — CUP PACEART REMOTE DEVICE CHECK
Battery Remaining Longevity: 127 mo
Battery Remaining Percentage: 95.5 %
Battery Voltage: 3.01 V
Brady Statistic AP VP Percent: 1 %
Brady Statistic AP VS Percent: 42 %
Brady Statistic AS VP Percent: 1 %
Brady Statistic AS VS Percent: 58 %
Brady Statistic RA Percent Paced: 41 %
Brady Statistic RV Percent Paced: 1 %
Date Time Interrogation Session: 20211015122752
Implantable Lead Implant Date: 20200106
Implantable Lead Implant Date: 20200106
Implantable Lead Location: 753859
Implantable Lead Location: 753860
Implantable Pulse Generator Implant Date: 20200106
Lead Channel Impedance Value: 490 Ohm
Lead Channel Impedance Value: 590 Ohm
Lead Channel Pacing Threshold Amplitude: 0.625 V
Lead Channel Pacing Threshold Amplitude: 0.75 V
Lead Channel Pacing Threshold Pulse Width: 0.5 ms
Lead Channel Pacing Threshold Pulse Width: 0.5 ms
Lead Channel Sensing Intrinsic Amplitude: 1.5 mV
Lead Channel Sensing Intrinsic Amplitude: 12 mV
Lead Channel Setting Pacing Amplitude: 0.875
Lead Channel Setting Pacing Amplitude: 2.5 V
Lead Channel Setting Pacing Pulse Width: 0.5 ms
Lead Channel Setting Sensing Sensitivity: 2 mV
Pulse Gen Model: 2272
Pulse Gen Serial Number: 9089721

## 2020-09-16 ENCOUNTER — Telehealth: Payer: Self-pay | Admitting: Cardiology

## 2020-09-16 NOTE — Telephone Encounter (Signed)
Called the patient and let her know that I did not see any documentation regarding someone calling her from our office. I let her know that it is noted in the chart that she called back in case someone needs to get in touch with her.

## 2020-09-16 NOTE — Telephone Encounter (Signed)
Returned call to [employer] with Hospice no answer. Spoke to patient she stated her B/P has been low the past 2 days 98/59.Today 91/68.Pulse 65 to 79.Stated she feels bad all the time,weak no energy.She has swelling in both feet.Hospice RN decreased Lasix to 20 mg daily last Friday.She takes Lisinopril 10 mg daily.Advised to take B/P in the morning if systolic below 356 hold Lisinopril.I will send message to [facility] for advice.

## 2020-09-16 NOTE — Telephone Encounter (Signed)
Patient states she is returning a call. However, she is unsure of who called and what call may be regarding.

## 2020-09-16 NOTE — Telephone Encounter (Addendum)
Pt c/o BP issue: STAT if pt c/o blurred vision, one-sided weakness or slurred speech  1. What are your last 5 BP readings? 91/68  2. Are you having any other symptoms (ex. Dizziness, headache, blurred vision, passed out)? lightheaded  3. What is your BP issue? Kellie RN from [facility] states the patients BP was very low. She requesting an order to change the patient's lasix since her edema has resolved.

## 2020-09-17 NOTE — Telephone Encounter (Signed)
Spoke with pt, Aware of dr crenshaw's recommendations.  °

## 2020-09-17 NOTE — Telephone Encounter (Signed)
DC lisinopril and follow BP [name]

## 2020-09-18 NOTE — Progress Notes (Signed)
Remote pacemaker transmission.   

## 2020-09-20 ENCOUNTER — Telehealth: Payer: Self-pay | Admitting: Cardiology

## 2020-09-20 NOTE — Telephone Encounter (Signed)
I spoke with patient. She reports she continues to have swelling in feet and ankles. No change from previous phone notes. Her feet feel cold every afternoon around 5 PM.  This is new this week. Feet are normal in color. She feels like there is no circulation in her legs from buttocks down.  States she can't describe but legs feel cold like her feet. No pain. States feet and ankles feel like they are going to pop. Does not weigh daily but states most recent weight is 142 lbs. States her weight is going down.  She will begin weighing every morning and let us know if she has weight gain of 3 lbs in a day or 5 lbs in a week. States BP is now normal after lisinopril was stopped earlier this week. Current dose of lasix is 20 mg daily. Will forward to Dr Stanford Breed for review/recommendations

## 2020-09-20 NOTE — Telephone Encounter (Signed)
Pt c/o swelling: STAT is pt has developed SOB within 24 hours  1) How much weight have you gained and in what time span? N/A  2) If swelling, where is the swelling located? Both legs and feet; feels like no circulation  3) Are you currently taking a fluid pill? Yes  4) Are you currently SOB? No  5) Do you have a log of your daily weights (if so, list)? No  6) Have you gained 3 pounds in a day or 5 pounds in a week? No  7) Have you traveled recently? No

## 2020-09-23 ENCOUNTER — Ambulatory Visit: Payer: Medicare HMO | Admitting: Podiatry

## 2020-09-23 NOTE — Telephone Encounter (Signed)
Change lasix to 40 mg daily; bmet one week Ketzaly Cardella  

## 2020-09-23 NOTE — Telephone Encounter (Signed)
Spoke with pt, Aware of dr crenshaw's recommendations.  °

## 2020-09-26 NOTE — Telephone Encounter (Addendum)
Spoke with patient of Dr. Stanford Breed who reports issues w/lasix, swelling in LE, and BP (multiple phone notes related to these issues recently) Patient reports feeling poorly after increasing lasix dose per 10/25 phone note - weakness, trouble walking/sleeping She reports stomach pains often, not new related to lasix dose increase She states she goes to the bathroom often and drinks about 24-30oz of water daily Since the lasix dose increase, she does not think the swelling in LE has improved much She expresses concern about circulation in her feet, states her feet are cold She was 143lbs this morning - does not have other readings to provide She states her BP has been up since starting lasix - patient has been taking PRN hydralazine also 10/28 @ 0954 - 147/86 (other readings this week: 145/88, 150/90) 10/27 171/91 10/24 117/95 10/23 156/91, 165/87 10/22 184/109, 199/110   Suggested she may need to come in for a visit (normally seen in [place]) but she states she has no one to bring her  Will route to MD to advise on medications, BP, etc

## 2020-09-26 NOTE — Telephone Encounter (Signed)
Pt c/o medication issue:  1. Name of Medication: furosemide (LASIX) 20 MG tablet  2. How are you currently taking this medication (dosage and times per day)? 40 mg by mouth daily  3. Are you having a reaction (difficulty breathing--STAT)? No   4. What is your medication issue? Patient called the follow up regarding Lasix medication increase. She states the medication increase has caused her to feel extremely weak. She states she has trouble walking and sleeping. She also reports she has been experiencing stomach pain and a "weird" feeling in her heart. Denies chest pain. Patient would like to know if the medication can be adjusted again. Please advise.

## 2020-09-26 NOTE — Telephone Encounter (Signed)
Continue hydralazine as needed; will need APPov or with me if she has additional issues [name] '

## 2020-09-27 NOTE — Telephone Encounter (Signed)
Spoke with pt, Aware of dr crenshaw's recommendations.  °

## 2020-09-27 NOTE — Telephone Encounter (Signed)
Returned the call to the patient. She stated that she has been feeling worse since taking the Furosemide and does not want to take it anymore. She feels like it does not help but also denies lower edema. She has been educated on the reason for the Furosemide and that it sounds like it has been helping.   She denies shortness of breath and stated her current weight is 143 pounds. She has complaints of cold feet, stomach pain and cannot sleep. She feels like this has all started with the start of the furosemide.   She stated that she cannot do an appointment due to lack of transportation and would like to have the furosemide as needed.

## 2020-09-27 NOTE — Telephone Encounter (Signed)
Ok to take lasix as needed [employer]

## 2020-09-27 NOTE — Telephone Encounter (Signed)
Laurie [name] is calling back due to never receiving a callback. Please advise.

## 2020-09-30 ENCOUNTER — Telehealth: Payer: Self-pay | Admitting: Cardiology

## 2020-09-30 NOTE — Telephone Encounter (Signed)
Spoke with pt, she has been getting elevated bp since stopping the lisinopril. She also is having a lot of pain in her feet and legs and reports they are swollen. She reports she did take a furosemide today and has not checked her bp since then. She reports her weight is down because she has not been eating well. Patient requested I call and speak with [facility] because they do not like it when she calls [place] about medications. Spoke with tammy at hospice, will make sure any changes will be communicated with them to help with coordination of care.

## 2020-09-30 NOTE — Telephone Encounter (Signed)
Left message for [facility] the hospice nurse to please return my call to find out how her bp was today.

## 2020-09-30 NOTE — Telephone Encounter (Signed)
New Message  Pt called after hours line 09/30/20 at 7:58am   Pt states her BP was high this morning and has been for 4 days. She is wondering if she can start taking Lisinopril? Her legs hurt real bad and ankles and feet are swollen   Please call

## 2020-10-01 NOTE — Telephone Encounter (Signed)
Spoke with pt, she reports that yesterday afternoon her bp dropped. She was constipated yesterday and feels like that caused her bp to elevate. Discussed with patient that weighing is a good way to figure out if she needs to take a furosemide or not. She will discuss with the hospice nurse about getting support hose. Encouraged patient to get her lab work done as soon as she can. Spoke with kellie. Hospice nurse, she will help get the patient support hose.

## 2020-10-02 DIAGNOSIS — R609 Edema, unspecified: Secondary | ICD-10-CM | POA: Diagnosis not present

## 2020-10-03 LAB — BASIC METABOLIC PANEL
BUN/Creatinine Ratio: 9 (calc) (ref 6–22)
BUN: 8 mg/dL (ref 7–25)
CO2: 29 mmol/L (ref 20–32)
Calcium: 9.1 mg/dL (ref 8.6–10.4)
Chloride: 94 mmol/L — ABNORMAL LOW (ref 98–110)
Creat: 0.89 mg/dL — ABNORMAL HIGH (ref 0.60–0.88)
Glucose, Bld: 82 mg/dL (ref 65–139)
Potassium: 3.6 mmol/L (ref 3.5–5.3)
Sodium: 132 mmol/L — ABNORMAL LOW (ref 135–146)

## 2020-10-18 ENCOUNTER — Telehealth: Payer: Self-pay | Admitting: Cardiology

## 2020-10-18 NOTE — Telephone Encounter (Signed)
Pt c/o BP issue: STAT if pt c/o blurred vision, one-sided weakness or slurred speech  1. What are your last 5 BP readings?   10/18/20 2:22 pm: 206/110- pt took two hydralazine at this time  2. Are you having any other symptoms (ex. Dizziness, headache, blurred vision, passed out)?   3. What is your BP issue? Pt was taken off her lisinopril a while back because it was making her BP go low. Today it was extremely high and she was nervous

## 2020-10-18 NOTE — Telephone Encounter (Signed)
Laurie [name] is a 85 year old female with past medical history of 7 cm aortic aneurysm and a type a dissection.  Due to her comorbidities, she was felt not to be a surgical candidate and that was managed medically.  She has home hospice who comes to her home every Thursday.  She contacted cardiology service in October 2021 due to hypotension with systolic blood pressure during in the 90s.  Her 10 mg daily of lisinopril were discontinued at that time.  Since earlier this month, her systolic blood pressure has shot up to 180-200 range.  She contacted [place] today as her blood pressure went up to 206/110.  After 2 doses of as needed hydralazine, her systolic blood pressure came down to 159.  When her blood pressure is elevated, she also described abdominal pain which I fear may be worsening dissection as well.  I discussed the case with DOD Dr. Percival Spanish, we recommend restart lisinopril at 10 mg daily.  We will continue to monitor blood pressure.  Use as needed dose of hydralazine if systolic blood pressures over 160.  She will need earlier follow-up.  Unfortunately she does not have any family member who lives with her.  Her son who lives locally passed away 2 years ago, her other son stays in [place].  Therefore she will need transportation.  We will ask our staff to arrange follow-up in our [place] office and transportation to get there.

## 2020-11-11 NOTE — Progress Notes (Signed)
Virtual Visit via Video Note changed to p[hone visit at pt request   This visit type was conducted due to national recommendations for restrictions regarding the COVID-19 Pandemic (e.g. social distancing) in an effort to limit this patient's exposure and mitigate transmission in our community.  Due to her co-morbid illnesses, this patient is at least at moderate risk for complications without adequate follow up.  This format is felt to be most appropriate for this patient at this time.  All issues noted in this document were discussed and addressed.  A limited physical exam was performed with this format.  Please refer to the patient's chart for her consent to telehealth for [facility].      Date:  11/15/2020   ID:  [name], DOB September 29, 1935, MRN [id number]  Patient Location:Home Provider Location: Home  PCP:  Hali Marry, MD  Cardiologist:  Dr Stanford Breed  Evaluation Performed:  Follow-Up Visit  Chief Complaint:  FU aortic dissection  History of Present Illness:    FUaortic dissection,atrial fibrillation, CAD, ho aortic stenosis status post aortic valve replacement in September 2009. She had a pericardial tissue valve; also had a septal myectomy and a right middle lobectomy due to lung CA. Note, she had a 50% mid LAD by catheterization preoperatively. Carotid Dopplers November 2017 showed mild bilateral atherosclerosis and no hemodynamically significant stenosis. Dopplers 5/18: ABI of 0.57 on the right and 0.76 on the left. Waveforms were suggestive of inflow disease. Duplex showed significant distal aortic and bilateral common iliac artery stenosis with moderate left SFA stenosis. Seen by Dr Fletcher Anon and medical therapy recommended for now.Amiodarone added at previousov for recurrent atrial fibrillation.Patient was admitted January 2020 with recurrent atrial fibrillation and was noted to have posttermination pauses. Pacemaker was placed.Echocardiogram repeated May  2020 and showed vigorous LV function. Bioprosthetic aortic valve not well visualized but gradient is normal. Abdominal CT September 2020 showed 3.3 x 4 cm infrarenal abdominal aortic aneurysm. Chest CT October 2020 showed new type a thoracic aortic dissection in the ascending thoracic aorta now measured 7 cm. Patient was admitted and seen by cardiothoracic surgery. It was felt that she would not be a good candidate for aortic root replacement and medical therapy recommended. Xarelto was discontinued due to aneurysm/dissection. Since I last saw her,she complains of abdominal and chest pain.  She denies increased dyspnea.  The patient does not have symptoms concerning for COVID-19 infection (fever, chills, cough, or new shortness of breath).    Past Medical History:  Diagnosis Date  . AAA (abdominal aortic aneurysm) ([place])   . Anemia    iron deficiency  . Ascending aorta dilatation ([facility])   . Atrial fibrillation ([place])    post op  . CAD (coronary artery disease)   . Carcinoma of lung ([place]) 08/28/2008   T2N0 moderately diff. squamous cell CA  . Cerebrovascular disease   . COPD (chronic obstructive pulmonary disease) ([employer]) 2005   on CXR with R apical scaring  . Dissecting aneurysm of ascending aorta ([place]) 09/11/2019   Not a candidate for surgical intervention on medical treatment for BP control  . Headache(784.0)    Hx: of migraines in the past  . Hypercholesterolemia    Hx: of  . Hypertension   . Incidental pulmonary nodule, > 83mm and < 76mm    LLL  . S/P aortic valve replacement 08/28/2008   [facility]  . S/P lobectomy of lung 08/28/2008   rt middle lobe  dr. Roxy Manns  . Shingles 11-09  .  Thoracic aortic dissection ([place]) 09/11/2019   Past Surgical History:  Procedure Laterality Date  . ABDOMINAL SURGERY    . AORTIC VALVE REPLACEMENT  08/28/2008   #6mm [facility] Ease pericardial tissue valve  . APPENDECTOMY    . CARDIAC CATHETERIZATION    . CATARACT EXTRACTION W/ INTRAOCULAR LENS   IMPLANT, BILATERAL Bilateral   . CHOLECYSTECTOMY N/A 12/13/2013   Procedure: LAPAROSCOPIC CHOLECYSTECTOMY;  Surgeon: Ralene Ok, MD;  Location: [place];  Service: General;  Laterality: N/A;  . COLONOSCOPY     \  . DILATION AND CURETTAGE OF UTERUS    . ERCP N/A 05/08/2015   Procedure: ENDOSCOPIC RETROGRADE CHOLANGIOPANCREATOGRAPHY (ERCP);  Surgeon: Inda Castle, MD;  Location: [place];  Service: Endoscopy;  Laterality: N/A;  . ERCP N/A 01/13/2016   Procedure: ENDOSCOPIC RETROGRADE CHOLANGIOPANCREATOGRAPHY (ERCP);  Surgeon: Doran Stabler, MD;  Location: [facility];  Service: Endoscopy;  Laterality: N/A;  . ERCP N/A 03/09/2016   Procedure: ENDOSCOPIC RETROGRADE CHOLANGIOPANCREATOGRAPHY (ERCP);  Surgeon: Ladene Artist, MD;  Location: [facility];  Service: Endoscopy;  Laterality: N/A;  . PACEMAKER IMPLANT N/A 12/05/2018   Procedure: PACEMAKER IMPLANT;  Surgeon: Evans Lance, MD;  Location: [place];  Service: Cardiovascular;  Laterality: N/A;  . RML removed Dr Ricard Dillon for lung cancer  08/28/2008   T2N0 squamous cell CA  . TONSILLECTOMY AND ADENOIDECTOMY    . u/s guided aspiration of R breast cyst   2009   at the breast clinic     Current Meds  Medication Sig  . acetaminophen (TYLENOL) 500 MG tablet Take 2 tablets (1,000 mg total) by mouth every 8 (eight) hours as needed (pain).  Marland Kitchen albuterol (PROVENTIL) (2.5 MG/3ML) 0.083% nebulizer solution Take 3 mLs (2.5 mg total) by nebulization every 4 (four) hours as needed for wheezing or shortness of breath.  . ALPRAZolam (XANAX) 0.5 MG tablet Take 1 tablet (0.5 mg total) by mouth 2 (two) times daily as needed for anxiety.  Marland Kitchen amiodarone (PACERONE) 100 MG tablet Take 100 mg by mouth daily.   Marland Kitchen amoxicillin (AMOXIL) 500 MG tablet Take all 4 tablets one hour before procedure  . famotidine (PEPCID) 20 MG tablet Take 20 mg by mouth daily.  . hydrALAZINE (APRESOLINE) 10 MG tablet Take 1 tablet (10 mg total) by mouth every 4 (four)  hours as needed (SBP > 140 mmHg).  Marland Kitchen lisinopril (ZESTRIL) 10 MG tablet Take 10 mg by mouth daily.  . Oxycodone HCl 10 MG TABS Take 1 tablet by mouth every four hours  as needed, take 1 tab, if no relief in 1 hr take 2nd tablet     Allergies:   Hyoscyamine, Rofecoxib, Statins, and Metoprolol   Social History   Tobacco Use  . Smoking status: Former Smoker    Years: 40.00  . Smokeless tobacco: Never Used  Vaping Use  . Vaping Use: Never used  Substance Use Topics  . Alcohol use: No    Alcohol/week: 0.0 standard drinks  . Drug use: No     Family Hx: The patient's family history includes Diabetes in her son; Stroke in her father. There is no history of Colon polyps, Colon cancer, Esophageal cancer, Stomach cancer, Pancreatic cancer, Kidney disease, or Liver disease.  ROS:   Please see the history of present illness.    Abdominal pain, blurred vision but No Fever, chills  or productive cough All other systems reviewed and are negative.  Recent Labs: 10/02/2020: BUN 8; Creat 0.89; Potassium  3.6; Sodium 132   Recent Lipid Panel Lab Results  Component Value Date/Time   CHOL 291 (H) 09/18/2010 08:32 PM   TRIG 308 (H) 09/18/2010 08:32 PM   HDL 38 (L) 09/18/2010 08:32 PM   CHOLHDL 7.7 Ratio 09/18/2010 08:32 PM   LDLCALC 191 (H) 09/18/2010 08:32 PM    Wt Readings from Last 3 Encounters:  11/15/20 137 lb (62.1 kg)  08/29/20 144 lb (65.3 kg)  11/15/19 158 lb (71.7 kg)     Objective:    Vital Signs:  Ht 5\' 6"  (1.676 m)   Wt 137 lb (62.1 kg)   BMI 22.11 kg/m    VITAL SIGNS:  reviewed NAD Answers questions appropriately Normal affect Remainder of physical examination not performed (telehealth visit; coronavirus pandemic)  ASSESSMENT & PLAN:    1. Thoracic aortic aneurysm-type a aortic dissection-as outlined in previous notes patient is felt not to be a candidate for aortic root replacement and medical therapy has been recommended.  Continue blood pressure control. Pt on  hospice. 2. Paroxysmal atrial fibrillation-continue amiodarone.  No anticoagulation in the setting of thoracic aortic aneurysm and type a aortic dissection. 3. Previous aortic valve replacement-continue SBE prophylaxis. 4. Thoracic/aortic/iliac aneurysms-no plans for follow-up imaging as she would not be a candidate for intervention. 5. Hypertension-blood pressure by report controlled at home.  Continue present medications and follow. 6. Previous pacemaker-Per EP. 7. Coronary artery disease-continue aspirin.  Intolerant to statins. 8. Hyperlipidemia-continue diet.  COVID-19 Education: The importance of social distancing was discussed today.  Time:   Today, I have spent 16 minutes with the patient with telehealth technology discussing the above problems.     Medication Adjustments/Labs and Tests Ordered: Current medicines are reviewed at length with the patient today.  Concerns regarding medicines are outlined above.   Tests Ordered: No orders of the defined types were placed in this encounter.   Medication Changes: No orders of the defined types were placed in this encounter.   Follow Up:  Virtual Visit  in 6 month(s)  Signed, [name], MD  11/15/2020 9:27 AM    [place]

## 2020-11-15 ENCOUNTER — Encounter: Payer: Self-pay | Admitting: Cardiology

## 2020-11-15 ENCOUNTER — Telehealth (INDEPENDENT_AMBULATORY_CARE_PROVIDER_SITE_OTHER): Admitting: Cardiology

## 2020-11-15 VITALS — Ht 66.0 in | Wt 137.0 lb

## 2020-11-15 DIAGNOSIS — I480 Paroxysmal atrial fibrillation: Secondary | ICD-10-CM | POA: Diagnosis not present

## 2020-11-15 DIAGNOSIS — I10 Essential (primary) hypertension: Secondary | ICD-10-CM | POA: Diagnosis not present

## 2020-11-15 DIAGNOSIS — Z950 Presence of cardiac pacemaker: Secondary | ICD-10-CM | POA: Diagnosis not present

## 2020-11-15 DIAGNOSIS — Z952 Presence of prosthetic heart valve: Secondary | ICD-10-CM

## 2020-11-15 DIAGNOSIS — I2510 Atherosclerotic heart disease of native coronary artery without angina pectoris: Secondary | ICD-10-CM

## 2020-11-15 DIAGNOSIS — I71019 Dissection of thoracic aorta, unspecified: Secondary | ICD-10-CM

## 2020-11-15 DIAGNOSIS — I7101 Dissection of thoracic aorta: Secondary | ICD-10-CM | POA: Diagnosis not present

## 2020-11-15 NOTE — Patient Instructions (Signed)
Follow-Up: At [facility], you and your health needs are our priority.  As part of our continuing mission to provide you with exceptional heart care, we have created designated Provider Care Teams.  These Care Teams include your primary Cardiologist (physician) and Advanced Practice Providers (APPs -  Physician Assistants and Nurse Practitioners) who all work together to provide you with the care you need, when you need it.  We recommend signing up for the patient portal called "MyChart".  Sign up information is provided on this After Visit Summary.  MyChart is used to connect with patients for Virtual Visits (Telemedicine).  Patients are able to view lab/test results, encounter notes, upcoming appointments, etc.  Non-urgent messages can be sent to your provider as well.   To learn more about what you can do with MyChart, go to [email or web address].    Your next appointment:   6 month(s)  The format for your next appointment:   Virtual Visit   Provider:   Brian Crenshaw, MD    

## 2020-12-13 ENCOUNTER — Telehealth: Payer: Self-pay

## 2020-12-13 NOTE — Telephone Encounter (Signed)
I spoke with the hospice nurse [place], rn. She spoke with the patient son and he is bringing the monitor to her. I gave her verbal instructions on how to send a manual transmission. Joy verbalized understanding.

## 2020-12-13 NOTE — Telephone Encounter (Signed)
[facility] nurse called because the patient received a call letting her know we did not receive her transmission. I tried to call the nurse back but she was unavailable. Joy phone number is [phone].

## 2020-12-20 ENCOUNTER — Ambulatory Visit (INDEPENDENT_AMBULATORY_CARE_PROVIDER_SITE_OTHER)

## 2020-12-20 DIAGNOSIS — I495 Sick sinus syndrome: Secondary | ICD-10-CM

## 2020-12-20 LAB — CUP PACEART REMOTE DEVICE CHECK
Battery Remaining Longevity: 125 mo
Battery Remaining Percentage: 95.5 %
Battery Voltage: 3.01 V
Brady Statistic AP VP Percent: 1 %
Brady Statistic AP VS Percent: 39 %
Brady Statistic AS VP Percent: 1 %
Brady Statistic AS VS Percent: 61 %
Brady Statistic RA Percent Paced: 38 %
Brady Statistic RV Percent Paced: 1 %
Date Time Interrogation Session: 20220120180922
Implantable Lead Implant Date: 20200106
Implantable Lead Implant Date: 20200106
Implantable Lead Location: 753859
Implantable Lead Location: 753860
Implantable Pulse Generator Implant Date: 20200106
Lead Channel Impedance Value: 460 Ohm
Lead Channel Impedance Value: 480 Ohm
Lead Channel Pacing Threshold Amplitude: 0.5 V
Lead Channel Pacing Threshold Amplitude: 0.75 V
Lead Channel Pacing Threshold Pulse Width: 0.5 ms
Lead Channel Pacing Threshold Pulse Width: 0.5 ms
Lead Channel Sensing Intrinsic Amplitude: 12 mV
Lead Channel Sensing Intrinsic Amplitude: 3.9 mV
Lead Channel Setting Pacing Amplitude: 0.75 V
Lead Channel Setting Pacing Amplitude: 2.5 V
Lead Channel Setting Pacing Pulse Width: 0.5 ms
Lead Channel Setting Sensing Sensitivity: 2 mV
Pulse Gen Model: 2272
Pulse Gen Serial Number: 9089721

## 2021-01-01 NOTE — Progress Notes (Signed)
Remote pacemaker transmission.   

## 2021-01-28 DEATH — deceased

## 2021-02-19 ENCOUNTER — Ambulatory Visit: Payer: Medicare HMO | Admitting: Cardiology
